# Patient Record
Sex: Female | Born: 1940
Health system: Southern US, Community
[De-identification: ages and names within clinical notes are randomized; demographics above are authoritative.]

## PROBLEM LIST (undated history)

## (undated) DIAGNOSIS — H9191 Unspecified hearing loss, right ear: Secondary | ICD-10-CM

## (undated) DIAGNOSIS — F329 Major depressive disorder, single episode, unspecified: Secondary | ICD-10-CM

## (undated) DIAGNOSIS — J449 Chronic obstructive pulmonary disease, unspecified: Secondary | ICD-10-CM

## (undated) DIAGNOSIS — M8580 Other specified disorders of bone density and structure, unspecified site: Secondary | ICD-10-CM

## (undated) DIAGNOSIS — I2510 Atherosclerotic heart disease of native coronary artery without angina pectoris: Secondary | ICD-10-CM

## (undated) DIAGNOSIS — R011 Cardiac murmur, unspecified: Secondary | ICD-10-CM

## (undated) DIAGNOSIS — C679 Malignant neoplasm of bladder, unspecified: Secondary | ICD-10-CM

## (undated) DIAGNOSIS — F32A Depression, unspecified: Secondary | ICD-10-CM

## (undated) DIAGNOSIS — R519 Headache, unspecified: Secondary | ICD-10-CM

## (undated) DIAGNOSIS — I10 Essential (primary) hypertension: Secondary | ICD-10-CM

## (undated) DIAGNOSIS — I639 Cerebral infarction, unspecified: Secondary | ICD-10-CM

## (undated) DIAGNOSIS — E785 Hyperlipidemia, unspecified: Secondary | ICD-10-CM

## (undated) DIAGNOSIS — K589 Irritable bowel syndrome without diarrhea: Secondary | ICD-10-CM

## (undated) DIAGNOSIS — R002 Palpitations: Secondary | ICD-10-CM

## (undated) DIAGNOSIS — I872 Venous insufficiency (chronic) (peripheral): Secondary | ICD-10-CM

## (undated) DIAGNOSIS — F17210 Nicotine dependence, cigarettes, uncomplicated: Secondary | ICD-10-CM

## (undated) DIAGNOSIS — E039 Hypothyroidism, unspecified: Secondary | ICD-10-CM

## (undated) DIAGNOSIS — H269 Unspecified cataract: Secondary | ICD-10-CM

## (undated) DIAGNOSIS — M1990 Unspecified osteoarthritis, unspecified site: Secondary | ICD-10-CM

## (undated) DIAGNOSIS — R51 Headache: Secondary | ICD-10-CM

## (undated) DIAGNOSIS — I341 Nonrheumatic mitral (valve) prolapse: Secondary | ICD-10-CM

## (undated) DIAGNOSIS — F419 Anxiety disorder, unspecified: Secondary | ICD-10-CM

## (undated) DIAGNOSIS — D333 Benign neoplasm of cranial nerves: Secondary | ICD-10-CM

## (undated) DIAGNOSIS — I679 Cerebrovascular disease, unspecified: Secondary | ICD-10-CM

## (undated) DIAGNOSIS — I729 Aneurysm of unspecified site: Secondary | ICD-10-CM

## (undated) DIAGNOSIS — Z87442 Personal history of urinary calculi: Secondary | ICD-10-CM

## (undated) HISTORY — DX: Benign neoplasm of cranial nerves: D33.3

## (undated) HISTORY — DX: Chronic obstructive pulmonary disease, unspecified: J44.9

## (undated) HISTORY — DX: Cardiac murmur, unspecified: R01.1

## (undated) HISTORY — DX: Hypothyroidism, unspecified: E03.9

## (undated) HISTORY — DX: Hyperlipidemia, unspecified: E78.5

## (undated) HISTORY — DX: Cerebral infarction, unspecified: I63.9

## (undated) HISTORY — PX: EYE SURGERY: SHX253

## (undated) HISTORY — DX: Anxiety disorder, unspecified: F41.9

## (undated) HISTORY — DX: Atherosclerotic heart disease of native coronary artery without angina pectoris: I25.10

## (undated) HISTORY — DX: Nonrheumatic mitral (valve) prolapse: I34.1

## (undated) HISTORY — DX: Venous insufficiency (chronic) (peripheral): I87.2

## (undated) HISTORY — DX: Major depressive disorder, single episode, unspecified: F32.9

## (undated) HISTORY — PX: APPENDECTOMY: SHX54

## (undated) HISTORY — DX: Aneurysm of unspecified site: I72.9

## (undated) HISTORY — DX: Depression, unspecified: F32.A

## (undated) HISTORY — DX: Unspecified cataract: H26.9

## (undated) HISTORY — DX: Palpitations: R00.2

## (undated) HISTORY — DX: Irritable bowel syndrome, unspecified: K58.9

## (undated) HISTORY — DX: Nicotine dependence, cigarettes, uncomplicated: F17.210

## (undated) HISTORY — DX: Other specified disorders of bone density and structure, unspecified site: M85.80

## (undated) HISTORY — DX: Cerebrovascular disease, unspecified: I67.9

## (undated) HISTORY — PX: DIAGNOSTIC LAPAROSCOPY: SUR761

## (undated) HISTORY — PX: TONSILLECTOMY: SUR1361

---

## 1968-06-12 HISTORY — PX: TOTAL ABDOMINAL HYSTERECTOMY: SHX209

## 1984-06-12 HISTORY — PX: BREAST LUMPECTOMY: SHX2

## 2000-06-12 HISTORY — PX: TRANSLABYRINTHINE PROCEDURE: SHX5220

## 2001-01-17 ENCOUNTER — Encounter: Admission: RE | Admit: 2001-01-17 | Discharge: 2001-01-17 | Payer: Self-pay | Admitting: Pulmonary Disease

## 2001-01-17 ENCOUNTER — Encounter: Payer: Self-pay | Admitting: Pulmonary Disease

## 2004-07-15 ENCOUNTER — Ambulatory Visit: Payer: Self-pay | Admitting: Pulmonary Disease

## 2004-08-29 ENCOUNTER — Ambulatory Visit: Payer: Self-pay | Admitting: Gastroenterology

## 2004-09-09 ENCOUNTER — Ambulatory Visit: Payer: Self-pay | Admitting: Gastroenterology

## 2005-08-07 ENCOUNTER — Ambulatory Visit: Payer: Self-pay | Admitting: Pulmonary Disease

## 2005-08-09 ENCOUNTER — Encounter: Admission: RE | Admit: 2005-08-09 | Discharge: 2005-08-09 | Payer: Self-pay | Admitting: Pulmonary Disease

## 2006-04-11 ENCOUNTER — Ambulatory Visit (HOSPITAL_COMMUNITY): Admission: RE | Admit: 2006-04-11 | Discharge: 2006-04-11 | Payer: Self-pay | Admitting: Pulmonary Disease

## 2006-04-11 ENCOUNTER — Ambulatory Visit: Payer: Self-pay | Admitting: Pulmonary Disease

## 2006-04-23 ENCOUNTER — Ambulatory Visit: Payer: Self-pay | Admitting: Pulmonary Disease

## 2006-11-02 ENCOUNTER — Ambulatory Visit: Payer: Self-pay | Admitting: Pulmonary Disease

## 2006-11-02 LAB — CONVERTED CEMR LAB
Alkaline Phosphatase: 127 units/L — ABNORMAL HIGH (ref 39–117)
BUN: 12 mg/dL (ref 6–23)
Bilirubin, Direct: 0.1 mg/dL (ref 0.0–0.3)
CO2: 29 meq/L (ref 19–32)
Cholesterol: 164 mg/dL (ref 0–200)
Creatinine, Ser: 0.7 mg/dL (ref 0.4–1.2)
Eosinophils Absolute: 0.3 10*3/uL (ref 0.0–0.6)
Eosinophils Relative: 2.5 % (ref 0.0–5.0)
GFR calc Af Amer: 108 mL/min
GFR calc non Af Amer: 89 mL/min
HDL: 44.8 mg/dL (ref >39.0)
Lymphocytes Relative: 19.9 % (ref 12.0–46.0)
Monocytes Absolute: 0.5 10*3/uL (ref 0.2–0.7)
Monocytes Relative: 4.3 % (ref 3.0–11.0)
Neutro Abs: 8.1 10*3/uL — ABNORMAL HIGH (ref 1.4–7.7)
Platelets: 294 10*3/uL (ref 150–400)
RBC: 4.22 M/uL (ref 3.87–5.11)
RDW: 11.8 % (ref 11.5–14.6)
TSH: 0.53 microintl units/mL (ref 0.35–5.50)
Triglycerides: 153 mg/dL — ABNORMAL HIGH (ref 0–149)
VLDL: 31 mg/dL (ref 0–40)

## 2007-07-18 ENCOUNTER — Ambulatory Visit: Payer: Self-pay | Admitting: Pulmonary Disease

## 2007-07-18 DIAGNOSIS — F411 Generalized anxiety disorder: Secondary | ICD-10-CM | POA: Insufficient documentation

## 2007-07-18 DIAGNOSIS — M797 Fibromyalgia: Secondary | ICD-10-CM | POA: Insufficient documentation

## 2007-07-18 DIAGNOSIS — E785 Hyperlipidemia, unspecified: Secondary | ICD-10-CM

## 2007-07-18 DIAGNOSIS — IMO0001 Reserved for inherently not codable concepts without codable children: Secondary | ICD-10-CM

## 2007-07-18 DIAGNOSIS — E782 Mixed hyperlipidemia: Secondary | ICD-10-CM | POA: Insufficient documentation

## 2007-07-18 DIAGNOSIS — K589 Irritable bowel syndrome without diarrhea: Secondary | ICD-10-CM | POA: Insufficient documentation

## 2007-07-18 DIAGNOSIS — J4489 Other specified chronic obstructive pulmonary disease: Secondary | ICD-10-CM | POA: Insufficient documentation

## 2007-07-18 DIAGNOSIS — F418 Other specified anxiety disorders: Secondary | ICD-10-CM | POA: Insufficient documentation

## 2007-07-18 DIAGNOSIS — I059 Rheumatic mitral valve disease, unspecified: Secondary | ICD-10-CM | POA: Insufficient documentation

## 2007-07-18 DIAGNOSIS — J449 Chronic obstructive pulmonary disease, unspecified: Secondary | ICD-10-CM | POA: Insufficient documentation

## 2007-07-18 DIAGNOSIS — N200 Calculus of kidney: Secondary | ICD-10-CM | POA: Insufficient documentation

## 2007-08-13 ENCOUNTER — Ambulatory Visit: Payer: Self-pay | Admitting: Pulmonary Disease

## 2007-08-18 DIAGNOSIS — E039 Hypothyroidism, unspecified: Secondary | ICD-10-CM | POA: Insufficient documentation

## 2007-08-18 DIAGNOSIS — F17209 Nicotine dependence, unspecified, with unspecified nicotine-induced disorders: Secondary | ICD-10-CM | POA: Insufficient documentation

## 2007-08-18 DIAGNOSIS — D333 Benign neoplasm of cranial nerves: Secondary | ICD-10-CM | POA: Insufficient documentation

## 2007-08-18 DIAGNOSIS — F17200 Nicotine dependence, unspecified, uncomplicated: Secondary | ICD-10-CM

## 2007-08-18 DIAGNOSIS — Z86018 Personal history of other benign neoplasm: Secondary | ICD-10-CM | POA: Insufficient documentation

## 2007-08-18 LAB — CONVERTED CEMR LAB
ALT: 15 units/L (ref 0–35)
AST: 19 units/L (ref 0–37)
Alkaline Phosphatase: 149 units/L — ABNORMAL HIGH (ref 39–117)
CO2: 24 meq/L (ref 19–32)
Eosinophils Absolute: 0.9 10*3/uL — ABNORMAL HIGH (ref 0.0–0.6)
Eosinophils Relative: 8.6 % — ABNORMAL HIGH (ref 0.0–5.0)
GFR calc Af Amer: 92 mL/min
GFR calc non Af Amer: 76 mL/min
Glucose, Bld: 91 mg/dL (ref 70–99)
Hemoglobin: 13.5 g/dL (ref 12.0–15.0)
Lymphocytes Relative: 29.5 % (ref 12.0–46.0)
Monocytes Absolute: 0.4 10*3/uL (ref 0.2–0.7)
Neutro Abs: 5.9 10*3/uL (ref 1.4–7.7)
Neutrophils Relative %: 57.5 % (ref 43.0–77.0)
Potassium: 3.9 meq/L (ref 3.5–5.1)
Rhuematoid fact SerPl-aCnc: <20 intl units/mL — ABNORMAL LOW (ref 0.0–20.0)
Total Bilirubin: 0.6 mg/dL (ref 0.3–1.2)
Total Protein: 7.3 g/dL (ref 6.0–8.3)
WBC: 10.2 10*3/uL (ref 4.5–10.5)

## 2007-09-16 ENCOUNTER — Ambulatory Visit: Payer: Self-pay | Admitting: Pulmonary Disease

## 2007-09-16 DIAGNOSIS — G4700 Insomnia, unspecified: Secondary | ICD-10-CM | POA: Insufficient documentation

## 2007-12-18 ENCOUNTER — Ambulatory Visit: Payer: Self-pay | Admitting: Pulmonary Disease

## 2007-12-18 DIAGNOSIS — M949 Disorder of cartilage, unspecified: Secondary | ICD-10-CM

## 2007-12-18 DIAGNOSIS — M899 Disorder of bone, unspecified: Secondary | ICD-10-CM | POA: Insufficient documentation

## 2007-12-21 LAB — CONVERTED CEMR LAB
ALT: 19 units/L (ref 0–35)
AST: 23 units/L (ref 0–37)
Albumin: 4.1 g/dL (ref 3.5–5.2)
BUN: 13 mg/dL (ref 6–23)
Cholesterol: 234 mg/dL (ref 0–200)
Direct LDL: 138.7 mg/dL
GFR calc non Af Amer: 106 mL/min
Glucose, Bld: 92 mg/dL (ref 70–99)
HDL: 39.5 mg/dL (ref >39.0)
Lymphocytes Relative: 33.7 % (ref 12.0–46.0)
MCHC: 34.5 g/dL (ref 30.0–36.0)
Neutro Abs: 6.5 10*3/uL (ref 1.4–7.7)
Neutrophils Relative %: 57.5 % (ref 43.0–77.0)
Potassium: 3.7 meq/L (ref 3.5–5.1)
Sodium: 145 meq/L (ref 135–145)
TSH: 0.07 microintl units/mL — ABNORMAL LOW (ref 0.35–5.50)
Total CHOL/HDL Ratio: 5.9
Triglycerides: 218 mg/dL (ref 0–149)
Vit D, 1,25-Dihydroxy: 16 — ABNORMAL LOW (ref 30–89)
WBC: 11.2 10*3/uL — ABNORMAL HIGH (ref 4.5–10.5)

## 2007-12-23 ENCOUNTER — Encounter: Payer: Self-pay | Admitting: Pulmonary Disease

## 2007-12-23 ENCOUNTER — Ambulatory Visit: Payer: Self-pay | Admitting: Internal Medicine

## 2007-12-25 ENCOUNTER — Telehealth (INDEPENDENT_AMBULATORY_CARE_PROVIDER_SITE_OTHER): Payer: Self-pay | Admitting: *Deleted

## 2008-03-27 ENCOUNTER — Encounter: Admission: RE | Admit: 2008-03-27 | Discharge: 2008-03-27 | Payer: Self-pay | Admitting: Obstetrics and Gynecology

## 2008-08-10 ENCOUNTER — Ambulatory Visit: Payer: Self-pay | Admitting: Pulmonary Disease

## 2008-08-10 DIAGNOSIS — I872 Venous insufficiency (chronic) (peripheral): Secondary | ICD-10-CM | POA: Insufficient documentation

## 2008-08-10 DIAGNOSIS — E559 Vitamin D deficiency, unspecified: Secondary | ICD-10-CM | POA: Insufficient documentation

## 2008-08-10 LAB — CONVERTED CEMR LAB: Vit D, 25-Hydroxy: 69 ng/mL (ref 30–89)

## 2008-08-17 LAB — CONVERTED CEMR LAB
AST: 21 units/L (ref 0–37)
Alkaline Phosphatase: 103 units/L (ref 39–117)
Basophils Absolute: 0 10*3/uL (ref 0.0–0.1)
Calcium: 9.5 mg/dL (ref 8.4–10.5)
GFR calc Af Amer: 107 mL/min
GFR calc non Af Amer: 89 mL/min
HCT: 40.2 % (ref 36.0–46.0)
HDL: 47.1 mg/dL (ref >39.0)
Hemoglobin: 13.8 g/dL (ref 12.0–15.0)
LDL Cholesterol: 107 mg/dL — ABNORMAL HIGH (ref 0–99)
MCHC: 34.5 g/dL (ref 30.0–36.0)
MCV: 91.8 fL (ref 78.0–100.0)
Monocytes Absolute: 0.4 10*3/uL (ref 0.1–1.0)
Neutro Abs: 7.2 10*3/uL (ref 1.4–7.7)
Potassium: 3.8 meq/L (ref 3.5–5.1)
RBC: 4.37 M/uL (ref 3.87–5.11)
RDW: 12.1 % (ref 11.5–14.6)
Sodium: 142 meq/L (ref 135–145)
TSH: 17.71 microintl units/mL — ABNORMAL HIGH (ref 0.35–5.50)
Total Bilirubin: 0.7 mg/dL (ref 0.3–1.2)

## 2008-09-30 ENCOUNTER — Telehealth (INDEPENDENT_AMBULATORY_CARE_PROVIDER_SITE_OTHER): Payer: Self-pay | Admitting: *Deleted

## 2009-01-13 ENCOUNTER — Encounter: Payer: Self-pay | Admitting: Pulmonary Disease

## 2009-03-11 ENCOUNTER — Telehealth (INDEPENDENT_AMBULATORY_CARE_PROVIDER_SITE_OTHER): Payer: Self-pay | Admitting: *Deleted

## 2009-03-11 ENCOUNTER — Ambulatory Visit: Payer: Self-pay | Admitting: Pulmonary Disease

## 2009-03-19 ENCOUNTER — Telehealth: Payer: Self-pay | Admitting: Pulmonary Disease

## 2009-03-24 ENCOUNTER — Ambulatory Visit (HOSPITAL_COMMUNITY): Admission: RE | Admit: 2009-03-24 | Discharge: 2009-03-24 | Payer: Self-pay | Admitting: Pulmonary Disease

## 2009-03-26 DIAGNOSIS — I729 Aneurysm of unspecified site: Secondary | ICD-10-CM

## 2009-03-26 DIAGNOSIS — Z8679 Personal history of other diseases of the circulatory system: Secondary | ICD-10-CM | POA: Insufficient documentation

## 2009-03-29 ENCOUNTER — Telehealth (INDEPENDENT_AMBULATORY_CARE_PROVIDER_SITE_OTHER): Payer: Self-pay | Admitting: *Deleted

## 2009-03-31 ENCOUNTER — Encounter: Payer: Self-pay | Admitting: Pulmonary Disease

## 2009-03-31 ENCOUNTER — Encounter: Payer: Self-pay | Admitting: Cardiology

## 2009-04-02 ENCOUNTER — Ambulatory Visit (HOSPITAL_COMMUNITY): Admission: RE | Admit: 2009-04-02 | Discharge: 2009-04-02 | Payer: Self-pay | Admitting: Interventional Radiology

## 2009-04-12 HISTORY — PX: ANEURYSM COILING: SHX5349

## 2009-04-23 ENCOUNTER — Telehealth: Payer: Self-pay | Admitting: Pulmonary Disease

## 2009-04-26 ENCOUNTER — Inpatient Hospital Stay (HOSPITAL_COMMUNITY): Admission: RE | Admit: 2009-04-26 | Discharge: 2009-04-27 | Payer: Self-pay | Admitting: Interventional Radiology

## 2009-05-12 ENCOUNTER — Encounter: Payer: Self-pay | Admitting: Interventional Radiology

## 2009-05-13 ENCOUNTER — Ambulatory Visit: Payer: Self-pay | Admitting: Pulmonary Disease

## 2009-05-13 ENCOUNTER — Telehealth (INDEPENDENT_AMBULATORY_CARE_PROVIDER_SITE_OTHER): Payer: Self-pay | Admitting: *Deleted

## 2009-05-28 ENCOUNTER — Ambulatory Visit: Payer: Self-pay | Admitting: Pulmonary Disease

## 2009-07-13 ENCOUNTER — Ambulatory Visit: Payer: Self-pay | Admitting: Pulmonary Disease

## 2009-07-13 DIAGNOSIS — I679 Cerebrovascular disease, unspecified: Secondary | ICD-10-CM | POA: Insufficient documentation

## 2009-07-22 ENCOUNTER — Telehealth (INDEPENDENT_AMBULATORY_CARE_PROVIDER_SITE_OTHER): Payer: Self-pay | Admitting: *Deleted

## 2009-07-30 ENCOUNTER — Ambulatory Visit (HOSPITAL_COMMUNITY): Admission: RE | Admit: 2009-07-30 | Discharge: 2009-07-30 | Payer: Self-pay | Admitting: Interventional Radiology

## 2009-07-31 ENCOUNTER — Ambulatory Visit (HOSPITAL_COMMUNITY): Admission: RE | Admit: 2009-07-31 | Discharge: 2009-07-31 | Payer: Self-pay | Admitting: Interventional Radiology

## 2009-11-19 ENCOUNTER — Ambulatory Visit: Payer: Self-pay | Admitting: Pulmonary Disease

## 2009-11-22 LAB — CONVERTED CEMR LAB
AST: 22 units/L (ref 0–37)
Albumin: 3.9 g/dL (ref 3.5–5.2)
Alkaline Phosphatase: 120 units/L — ABNORMAL HIGH (ref 39–117)
Basophils Absolute: 0 10*3/uL (ref 0.0–0.1)
Basophils Relative: 0.5 % (ref 0.0–3.0)
CO2: 30 meq/L (ref 19–32)
Calcium: 9.5 mg/dL (ref 8.4–10.5)
Cholesterol: 162 mg/dL (ref 0–200)
Direct LDL: 88 mg/dL
Eosinophils Absolute: 0.2 10*3/uL (ref 0.0–0.7)
Eosinophils Relative: 2.2 % (ref 0.0–5.0)
GFR calc non Af Amer: 97.83 mL/min (ref >60)
HCT: 38.5 % (ref 36.0–46.0)
Lymphocytes Relative: 33.5 % (ref 12.0–46.0)
Lymphs Abs: 3 10*3/uL (ref 0.7–4.0)
MCHC: 33.5 g/dL (ref 30.0–36.0)
MCV: 86.7 fL (ref 78.0–100.0)
Monocytes Relative: 5.5 % (ref 3.0–12.0)
RBC: 4.44 M/uL (ref 3.87–5.11)
Sodium: 143 meq/L (ref 135–145)
Total Bilirubin: 0.5 mg/dL (ref 0.3–1.2)
Total Protein: 6.9 g/dL (ref 6.0–8.3)
Triglycerides: 230 mg/dL — ABNORMAL HIGH (ref 0.0–149.0)

## 2009-12-22 ENCOUNTER — Encounter: Payer: Self-pay | Admitting: Pulmonary Disease

## 2009-12-22 ENCOUNTER — Encounter: Payer: Self-pay | Admitting: Cardiology

## 2010-01-27 ENCOUNTER — Ambulatory Visit: Payer: Self-pay | Admitting: Internal Medicine

## 2010-02-09 ENCOUNTER — Encounter: Payer: Self-pay | Admitting: Pulmonary Disease

## 2010-03-01 ENCOUNTER — Encounter: Payer: Self-pay | Admitting: Pulmonary Disease

## 2010-03-21 ENCOUNTER — Telehealth (INDEPENDENT_AMBULATORY_CARE_PROVIDER_SITE_OTHER): Payer: Self-pay | Admitting: *Deleted

## 2010-03-28 ENCOUNTER — Ambulatory Visit: Payer: Self-pay | Admitting: Pulmonary Disease

## 2010-03-28 DIAGNOSIS — R52 Pain, unspecified: Secondary | ICD-10-CM | POA: Insufficient documentation

## 2010-03-30 DIAGNOSIS — I6350 Cerebral infarction due to unspecified occlusion or stenosis of unspecified cerebral artery: Secondary | ICD-10-CM | POA: Insufficient documentation

## 2010-03-30 LAB — CONVERTED CEMR LAB
ANA Titer 1: 1:40 {titer} — ABNORMAL HIGH
Anti Nuclear Antibody(ANA): POSITIVE — AB
BUN: 15 mg/dL (ref 6–23)
Creatinine, Ser: 0.7 mg/dL (ref 0.4–1.2)
Eosinophils Relative: 1.5 % (ref 0.0–5.0)
Free T4: 1.19 ng/dL (ref 0.60–1.60)
GFR calc non Af Amer: 94.32 mL/min (ref >60)
Glucose, Bld: 80 mg/dL (ref 70–99)
Lymphocytes Relative: 30.2 % (ref 12.0–46.0)
Lymphs Abs: 3.2 10*3/uL (ref 0.7–4.0)
MCHC: 33.8 g/dL (ref 30.0–36.0)
MCV: 92.9 fL (ref 78.0–100.0)
Monocytes Relative: 4.6 % (ref 3.0–12.0)
Neutro Abs: 6.7 10*3/uL (ref 1.4–7.7)
Neutrophils Relative %: 63.6 % (ref 43.0–77.0)
Potassium: 4.3 meq/L (ref 3.5–5.1)
Rhuematoid fact SerPl-aCnc: <20 intl units/mL (ref 0–20)
Sed Rate: 21 mm/hr (ref 0–22)
Sodium: 144 meq/L (ref 135–145)
T3, Free: 2.9 pg/mL (ref 2.3–4.2)
TSH: 1.25 microintl units/mL (ref 0.35–5.50)

## 2010-04-04 ENCOUNTER — Ambulatory Visit: Payer: Self-pay | Admitting: Pulmonary Disease

## 2010-05-10 ENCOUNTER — Encounter: Payer: Self-pay | Admitting: Pulmonary Disease

## 2010-05-23 ENCOUNTER — Ambulatory Visit: Payer: Self-pay | Admitting: Pulmonary Disease

## 2010-06-16 ENCOUNTER — Encounter: Payer: Self-pay | Admitting: Pulmonary Disease

## 2010-06-30 ENCOUNTER — Telehealth: Payer: Self-pay | Admitting: Pulmonary Disease

## 2010-07-03 ENCOUNTER — Encounter: Payer: Self-pay | Admitting: Obstetrics and Gynecology

## 2010-07-03 ENCOUNTER — Encounter: Payer: Self-pay | Admitting: Interventional Radiology

## 2010-07-10 LAB — CONVERTED CEMR LAB
AST: 21 units/L (ref 0–37)
Albumin: 4.3 g/dL (ref 3.5–5.2)
Basophils Absolute: 0 10*3/uL (ref 0.0–0.1)
Bilirubin, Direct: 0.1 mg/dL (ref 0.0–0.3)
CO2: 28 meq/L (ref 19–32)
Calcium: 9.5 mg/dL (ref 8.4–10.5)
Chloride: 107 meq/L (ref 96–112)
Creatinine, Ser: 0.6 mg/dL (ref 0.4–1.2)
Eosinophils Absolute: 0.3 10*3/uL (ref 0.0–0.7)
Glucose, Bld: 87 mg/dL (ref 70–99)
HCT: 37.7 % (ref 36.0–46.0)
MCHC: 34.9 g/dL (ref 30.0–36.0)
Monocytes Absolute: 0.7 10*3/uL (ref 0.1–1.0)
Neutro Abs: 6.2 10*3/uL (ref 1.4–7.7)
Platelets: 253 10*3/uL (ref 150.0–400.0)
Pro B Natriuretic peptide (BNP): 42 pg/mL (ref 0.0–100.0)
RDW: 12.2 % (ref 11.5–14.6)
TSH: 0.21 microintl units/mL — ABNORMAL LOW (ref 0.35–5.50)
Total Protein: 7.4 g/dL (ref 6.0–8.3)

## 2010-07-12 NOTE — Assessment & Plan Note (Signed)
Summary: ROV/ MBW   Primary Care Provider:  Dr. Kriste Basque   CC:  4 month ROV & review of mult medical problems....  History of Present Illness: 70 y/o WF here for a follow up visit... she has multiple medical problems as reviewed below...     ~  March 11, 2009:  she had a good 59mo until 9/16 when she had a "spell" w/ palpitations/ skipping & "lost control of my body"... they called 911 & paramedics came but she refused transport to ER... states the episode lasted 2-3 hrs & had n/v x1... there were no focal symptoms, and recurrence since that day...  she has hx Acoustic Neuroma w/ surg at [facility] in 2002- last MRI 2007 stable, no recurrence... she has kown MVP w/ prev cardiac eval in 1995 by DrStuckey- neg cath, mild MVP (see below)... mult risk factors and still smokes- last Cardiolite 1999 was neg- no infarct or ischemia, EF=66%.   ** eval w/ CXR (no change), EKG (no change), labs (NAD), & MRI Br= no infarct, no mass, clear sinuses, post-op changes right temporal & mastoid region w/o recurrent tumor; but 4x25mm basilar tip aneurysm noted>> refer to IR.   ~  July 13, 2009:  she's had an eventful 26mo w/ eval by DrDeveshwar & hosp 11/10 for stent-assisted coiling of basilar tip aneurysm- disch on ASA & Plavix but the Plavix has since been stopped... she developed incr headaches- frontal & band like, w/ occas stabbing pain in right ear... seen by TP w/ right OM- Rx w/ ZPak, Cortisporin, Nasacort AQ, & Saline mist... pt notes HAs better after sleep... denies extra stress etc... ** we discussed Rx w/ Tramadol, Chlorazepate, etc...   ~  November 19, 2009:  she developed some gait instability 2/11 & was rechecked by IR_ DrDeveshwar> MRI showed right cbll lacunar infarct, & post op changes in right cerebellopontine angle, plus interval endovasc treatment of basilar tip aneurysm;  and f/u arteriogram showed angiographic obliteration of the prev basialr tip aneurysm- distal basilar art stent patent, 50% stenosis  right PCA... rec to restart PLAVIX + keep ASA 325mg /d, and quit smoking!!!  Her CC is aching & sore, low energy, etc- c/w her FM;  still smoking 1/4 ppd and can't/ won't quit despite everything.   Current Problems:   COPD (ICD-496) - she has a min smoker's cough, sm amt beige sputum...  ~  CXR 5/08 w/ chr changes, LLL scar, NAD.Marland Kitchen.  ~  CXR 7/09 showed norm hrt size, clear lungs, NAD.Marland Kitchen.  ~  CXR 9/10 showed NAD...  CIGARETTE SMOKER (ICD-305.1) - can't vs won't quit and not interested in smoking cessation help... now she states she's decr to  ~1/4 ppd but can't seem to improve from there...  MITRAL VALVE PROLAPSE (ICD-424.0) & Hx of CHEST PAIN (ICD-786.50) - she had CP in the past and a neg cath 1995... she takes ASA 325mg  daily...  ~  CP eval 1995 w/ cath showing min 10% luminal irreg RCA & mild MVP, norm LVF...  ~  Cardiolite 1999 was neg- no ischemia, no infarct, EF=66%...  ~  baseline EKG showed NSR, NSSTTWA (poor R prog V1-V3)...  CEREBROVASCULAR DISEASE (ICD-437.9), & ANEURYSM (ICD-442.9) - found to have a 4-18mm basilar tip aneurysm on MRI Br 10/10... referred to [facility] w/ Angiogram confirmation & subseq stent assisted coiling of the aneurysm 11/10... subseq f/u angiogram 2/11 showed complete angiographic obliteration of the aneurysm, & patent stent in distal basilar art (there was a new 50%stenosis  in the right PCA)... on ASA 325mg /d & PLAVIX restarted 2/11 w/ new gait abn & cbll infarct on MRI.  VENOUS INSUFFICIENCY (ICD-459.81) - she tells me that she has been seeing DrKrush for vein surgery and treatment ("it's covered by my husb's insurance")  HYPERLIPIDEMIA (ICD-272.4) - now on PRAVASTATIN 40mg /d & tol well...  ~  chart review shows TChol  ~ 200 range in the 80's and early 90's.  ~  then TChol incr to  ~250 range in late 90's and Lipitor started-   ~  FLP 5/08 on Lipitor 10mg /d showed TChol 164, TG 153, HDL 45, LDL 89.  ~  in 2009 c/o no energy & arm discomfort she felt was  from the Lip10- therefore DC'd.  ~  FLP 7/09 on diet alone showed TChol 234, TG 218, HDL 40, LDL 139... rec- start Prav40.  ~  FLP 3/10 on Prav40 showed TChol 182, Tg 137, HDL 47, LDL 107  ~  FLP 6/11 showed TChol 162, TG 230, HDL 37, LDL 88  HYPOTHYROIDISM (ICD-244.9) - stable on LEVOTHYROID 159mcg/d... hx Hashimoto's thyroiditis in 1989 w/ markedly pos antimicrosomal antibodies and transient hyperthy labs...   ~  labs 5/08 showed TSH= 0.53... rec- continue Levo100/d.  ~  labs 7/09 showed TSH= 0.07... rec- decr Levothy100 to 1/2 daily.  ~  labs 3/10 showed TSH= 17.71... rec> incr back to 180mcg/d.  ~  labs 9/10 showed TSH= 0.21... continue same.  ~  labs 6/11 showed TSH= 0.10... rec decr Levo100 to 1/2 daily.  IRRITABLE BOWEL SYNDROME (ICD-564.1) - had colonoscopy by DrPatterson 11/06 that was WNL.Marland Kitchen. she notes some constipation & Rx w/ colase OTC...  NEPHROLITHIASIS (ICD-592.0) - last stone passed in 1993, no known recurrence since then...  Hx of TENDINITIS, RIGHT ELBOW (ICD-727.09)  FIBROMYALGIA (ICD-729.1) - all prev symptoms resolved...  ~  labs 9/10 showed Sed= 24  OSTEOPENIA (ICD-733.90) - BMD here 7/09 showed TScores -0.8 in Spine, and -1.2 in right FemNeck... rec to take Calcium, MVI, Vit D...  VITAMIN D DEFICIENCY (ICD-268.9)  ~  labs 7/09 showed Vit D level = 16... rec- start Vit D 50K weekly.  ~  labs 3/10 showed Vit D level = 69... rec> change to 1000 u daily.  ~  labs 6/11 showed Vit d level = 55... continue same.  Hx of ACOUSTIC NEUROMA (ICD-225.1) - s/p surg for this tumor 10/02 at [facility]... no known recurrence...  ~  MRI Brain 2/07 showed s/p translabyrinthine approach to right acoustic neuroma w/o recurrence, NAD...  ~  MRI Brain 10/10 showed no recurrence of tumor, but 4-61mm basilar tip aneurysm detected & Rx as above.  ~  MRI Brain 2/11 showed no recurrence/ post op changes, endovasc oblit of basilar aneurysm, cerebellar lacunar infarct & 50% stenosis of right  PCA... PLAVIX restarted...  ANXIETY (ICD-300.00) - uses CHLORAZEPATE 7.5mg Tid... she gets claustrophobic and needs sedation before MRIs.   Current Medications (verified): 1)  Bayer Aspirin 325 Mg Tabs (Aspirin) .... Take 1 Tablet By Mouth Once A Day 2)  Pravastatin Sodium 40 Mg  Tabs (Pravastatin Sodium) .... Take 1 Tablet By Mouth Once A Day 3)  Levoxyl 100 Mcg  Tabs (Levothyroxine Sodium) .... Take 1  Tablet By Mouth Once A Day 4)  Caltrate 600+d 600-400 Mg-Unit Tabs (Calcium Carbonate-Vitamin D) .... Take 1 Tab By Mouth Two Times A Day... 5)  Vitamin D 1000 Unit Tabs (Cholecalciferol) .... Take 1 Cap By Mouth Once Daily.Marland KitchenMarland Kitchen 6)  Clorazepate Dipotassium 7.5  Mg  Tabs (Clorazepate Dipotassium) .Marland Kitchen.. 1 Tab By Mouth Three Times A Day As Directed... 7)  Tramadol Hcl 50 Mg Tabs (Tramadol Hcl) .... Take 1 Tab By Mouth Three Times A Day As Directed... 8)  Plavix 75 Mg Tabs (Clopidogrel Bisulfate) .... Take 1 Tablet By Mouth Once A Day 9)  Stool Softener Laxative 8.6-50 Mg Tabs (Sennosides-Docusate Sodium) .... 2 Tabs By Mouth At Bedtime  Allergies (verified): 1)  ! Lipitor (Atorvastatin Calcium) 2)  ! Vicodin 3)  ! Morphine  Comments:  Nurse/Medical Assistant: The patient's medications and allergies were reviewed with the patient and were updated in the Medication and Allergy Lists. Boone Master CNA/MA  November 19, 2009 10:20 AM   Past History:  Past Medical History: COPD (ICD-496) CIGARETTE SMOKER (ICD-305.1) MITRAL VALVE PROLAPSE (ICD-424.0) Hx of CHEST PAIN (ICD-786.50) PALPITATIONS, HX OF (ICD-V12.50) CEREBROVASCULAR DISEASE (ICD-437.9) - known 50% stenosis in right PCA on angiogram 2/11 ANEURYSM (ICD-442.9) - s/p stent assisted endovascular obliteration of basilar tip aneurysm 11/10 by DrDeveshwar VENOUS INSUFFICIENCY (ICD-459.81) HYPERLIPIDEMIA (ICD-272.4) HYPOTHYROIDISM (ICD-244.9) IRRITABLE BOWEL SYNDROME (ICD-564.1) NEPHROLITHIASIS (ICD-592.0) Hx of TENDINITIS, RIGHT ELBOW  (ICD-727.09) FIBROMYALGIA (ICD-729.1) OSTEOPENIA (ICD-733.90) VITAMIN D DEFICIENCY (ICD-268.9) Hx of ACOUSTIC NEUROMA (ICD-225.1) - s/p translabyrinthine surg 2002 at [facility] DrBrowne   s/p lacunar infarct in right cerebellar hemisphere noted on MRI 2/11 HEADACHE (ICD-784.0) ANXIETY (ICD-300.00) INSOMNIA (ICD-780.52)  Past Surgical History: S/P neurosurgery for acoustic neuroma -2002 at [facility] S/P hysterectomy  S/P breast lumpectomy S/P endovascular coiling of basilar tip aneurysm 11/10 by DrDeveshwar  Family History: Reviewed history from 04/20/2009 and no changes required.  Her mother died at age 70.  She died from an MI, but   she had also had a CVA.  Her father died at age 30 from cancer.  The  patient is not aware of any family history of cerebral aneurysms.      Social History: Reviewed history from 04/20/2009 and no changes required. Married Children + smoker- 1/4 ppd at present social alcohol employ- alterations work  Review of Systems      See HPI       The patient complains of dyspnea on exertion and difficulty walking.  The patient denies anorexia, fever, weight loss, weight gain, vision loss, decreased hearing, hoarseness, chest pain, syncope, peripheral edema, prolonged cough, headaches, hemoptysis, abdominal pain, melena, hematochezia, severe indigestion/heartburn, hematuria, incontinence, muscle weakness, suspicious skin lesions, transient blindness, depression, unusual weight change, abnormal bleeding, enlarged lymph nodes, and angioedema.    Vital Signs:  Patient profile:   70 year old female Height:      62.5 inches Weight:      128.31 pounds BMI:     23.18 O2 Sat:      97 % on Room air Temp:     98.9 degrees F oral Pulse rate:   73 / minute BP sitting:   138 / 70  (left arm) Cuff size:   regular  Vitals Entered By: Boone Master CNA/MA (November 19, 2009 10:20 AM)  O2 Flow:  Room air  Physical Exam  Additional Exam:  WD, WN, 70 y/o WF in NAD... GENERAL:   Alert & oriented; pleasant & cooperative. HEENT:  [place]/AT, EOM-wnl, PERRLA, EACs-clear, TMs- some scarring on right, NOSE-clear, THROAT-clear & wnl. NECK:  Supple w/ fairROM; no JVD; normal carotid impulses w/o bruits; palp thyroid, no nodules felt; no lymphadenopathy. CHEST:  Clear to P & A; without wheezes/ rales/ or rhonchi. HEART:  Regular Rhythm; gr1/6 SEM,  no rubs or  gallops heard... ABDOMEN:  Soft & nontender; normal bowel sounds; no organomegaly or masses detected. EXT: without deformities, mild arthritic changes; no varicose veins/ venous insuffic/ or edema... NEURO:  CN's intact; no focal neuro deficits... gait & station are normal. DERM:  No lesions noted; no rash etc...    MISC. Report  Procedure date:  11/19/2009  Findings:      BMP (METABOL)   Sodium                    143 mEq/L                   135-145   Potassium                 4.3 mEq/L                   3.5-5.1   Chloride                  109 mEq/L                   96-112   Carbon Dioxide            30 mEq/L                    19-32   Glucose                   84 mg/dL                    40-98   BUN                       14 mg/dL                    1-19   Creatinine                0.6 mg/dL                   1.4-7.8   Calcium                   9.5 mg/dL                   2.9-56.2   GFR                       97.83 mL/min                >60  Hepatic/Liver Function Panel (HEPATIC)   Total Bilirubin           0.5 mg/dL                   1.3-0.8   Direct Bilirubin          0.1 mg/dL                   6.5-7.8   Alkaline Phosphatase [H]  120 U/L                     39-117   AST                       22 U/L                      0-37   ALT  16 U/L                      0-35   Total Protein             6.9 g/dL                    1.6-1.0   Albumin                   3.9 g/dL                    9.6-0.4  CBC Platelet w/Diff (CBCD)   White Cell Count          9.0 K/uL                    4.5-10.5   Red  Cell Count            4.44 Mil/uL                 3.87-5.11   Hemoglobin                12.9 g/dL                   54.0-98.1   Hematocrit                38.5 %                      36.0-46.0   MCV                       86.7 fl                     78.0-100.0   Platelet Count            287.0 K/uL                  150.0-400.0   Neutrophil %              58.3 %                      43.0-77.0   Lymphocyte %              33.5 %                      12.0-46.0   Monocyte %                5.5 %                       3.0-12.0   Eosinophils%              2.2 %                       0.0-5.0   Basophils %               0.5 %                       0.0-3.0  Comments:      Lipid Panel (LIPID)   Cholesterol               162 mg/dL  0-200   Triglycerides        [H]  230.0 mg/dL                 1.6-109.6   HDL                  [L]  04.54 mg/dL                 >09.81    Cholesterol LDL - Direct                             88.0 mg/dL  TSH (TSH)   FastTSH              [L]  0.10 uIU/mL                 0.35-5.50  Sed Rate (ESR)   Sed Rate             [H]  30 mm/hr                    0-22  Vitamin D (25-Hydroxy) (19147)  Vitamin D (25-Hydroxy)                             55 ng/mL                    30-89   Impression & Recommendations:  Problem # 1:  COPD (ICD-496) Despite all that she has been thru she is still smoking!!!  Must quit all smoking & offered Chantix, etc (she isn't interested)...  Problem # 2:  MITRAL VALVE PROLAPSE (ICD-424.0) Cardiac stable>  must quit smoking etc... Her updated medication list for this problem includes:    Bayer Aspirin 325 Mg Tabs (Aspirin) .Marland Kitchen... Take 1 tablet by mouth once a day    Plavix 75 Mg Tabs (Clopidogrel bisulfate) .Marland Kitchen... Take 1 tablet by mouth once a day  Problem # 3:  CEREBROVASCULAR DISEASE (ICD-437.9) Followed by DrDeveshwar... as noted in the HPI...  Problem # 4:  HYPERLIPIDEMIA (ICD-272.4) Stable on Prav40 but needs better low  fat diet... Her updated medication list for this problem includes:    Pravastatin Sodium 40 Mg Tabs (Pravastatin sodium) .Marland Kitchen... Take 1 tablet by mouth once a day  Orders: TLB-BMP (Basic Metabolic Panel-BMET) (80048-METABOL) TLB-Hepatic/Liver Function Pnl (80076-HEPATIC) TLB-CBC Platelet - w/Differential (85025-CBCD) TLB-Lipid Panel (80061-LIPID) TLB-TSH (Thyroid Stimulating Hormone) (84443-TSH) TLB-Sedimentation Rate (ESR) (85652-ESR) T-Vitamin D (25-Hydroxy) (82956-21308)  Problem # 5:  HYPOTHYROIDISM (ICD-244.9) TSH is low> either oversuppressed on the or she's become hyperthy/ overactive again... needs to decr Levo100 to 1/2 daily & we will f/u TSH on this... Her updated medication list for this problem includes:    Levoxyl 100 Mcg Tabs (Levothyroxine sodium) .Marland Kitchen... Take 1  tablet by mouth once a day  Problem # 6:  FIBROMYALGIA (ICD-729.1) we discussed exercise, rest, meds... Her updated medication list for this problem includes:    Bayer Aspirin 325 Mg Tabs (Aspirin) .Marland Kitchen... Take 1 tablet by mouth once a day    Tramadol Hcl 50 Mg Tabs (Tramadol hcl) .Marland Kitchen... Take 1 tab by mouth three times a day as directed...  Problem # 7:  OTHER MEDICAL PROBLEMS AS NOTED>>>  Complete Medication List: 1)  Bayer Aspirin 325 Mg Tabs (Aspirin) .... Take 1 tablet by mouth once a day 2)  Plavix 75 Mg Tabs (Clopidogrel  bisulfate) .... Take 1 tablet by mouth once a day 3)  Pravastatin Sodium 40 Mg Tabs (Pravastatin sodium) .... Take 1 tablet by mouth once a day 4)  Levoxyl 100 Mcg Tabs (Levothyroxine sodium) .... Take 1  tablet by mouth once a day 5)  Caltrate 600+d 600-400 Mg-unit Tabs (Calcium carbonate-vitamin d) .... Take 1 tab by mouth two times a day... 6)  Vitamin D 1000 Unit Tabs (Cholecalciferol) .... Take 1 cap by mouth once daily.Marland KitchenMarland Kitchen 7)  Clorazepate Dipotassium 7.5 Mg Tabs (Clorazepate dipotassium) .Marland Kitchen.. 1 tab by mouth three times a day as directed... 8)  Tramadol Hcl 50 Mg Tabs (Tramadol  hcl) .... Take 1 tab by mouth three times a day as directed... 9)  Stool Softener Laxative 8.6-50 Mg Tabs (Sennosides-docusate sodium) .... 2 tabs by mouth at bedtime  Patient Instructions: 1)  Today we updated your med list- see below.... 2)  Continue your current meds the same... 3)  Add one extra strength Tylenol to the Tramadol you currently take three times a day... 4)  Today we did your follow up FASTING blood work... please call the "phone tree" in a few days for your lab results.Marland KitchenMarland Kitchen 5)  You should committ to a stretching exercise program like yoga etc... or [facility]... 6)  Call for any questions.Marland KitchenMarland Kitchen 7)  Please schedule a follow-up appointment in 6 months.

## 2010-07-12 NOTE — Assessment & Plan Note (Signed)
Summary: Acute NP office visit - arm and wrist pain   Primary Provider/Referring Provider:  Dr. Kriste Basque   CC:  right wrist pain w/ movement x1.70months.  History of Present Illness: 70 year old patient of Dr. Kriste Basque with know history of hyperlipidemia, COPD      ~  March 11, 2009:  she had a good 82mo until 9/16 when she had a "spell" w/ palpitations/ skipping & "lost control of my body"... they called 911 & paramedics came but she refused transport to ER... states the episode lasted 2-3 hrs & had n/v x1... there were no focal symptoms, and recurrence since that day...  she has hx Acoustic Neuroma w/ surg at [facility] in 2002- last MRI 2007 stable, no recurrence... she has kown MVP w/ prev cardiac eval in 1995 by DrStuckey- neg cath, mild MVP (see below)... mult risk factors and still smokes- last Cardiolite 199 was neg- no infarct or ischemia, EF=66%.      ** we discussed f/u CXR, lab work, MRI Brain, & Cards eval...  May 13, 2009--Presents for an acute office visit. Complains of constant severe right ear pain - states feels like "a pencil is stabbing" her x2weeks. No known injury. Has not been using qtips or other objects in ear. No change in hearing.Denies chest pain, dyspnea, orthopnea, hemoptysis, fever, n/v/d, edema, headache. Mild sinus drainage, congestion. 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 Corliss Skains. Has done well post op.  Had follow up yesterday. Recommended to see primary for ear pain.   May 28, 2009--Presents for follow up . Last visit seen w/ otitis media, tx w/ zpack and cortisporin drops. Ear pain is resolved. Feels better. Nose is stuffy, feels congested in sinus.       ~  July 13, 2009:  she's had an eventful 58mo w/ eval by DrDeveshwar & hosp 11/10 for stent-assisted coiling of basilar tip aneurysm- disch on ASA & Plavix but the Plavix has since been stopped... she developed incr headaches- frontal &  band like, w/ occas stabbing pain in right ear... seen by TP w/ right OM- Rx w/ ZPak, Cortisporin, Nasacort AQ, & Saline mist... pt notes HAs better after sleep... denies extra stress etc... ** we discussed Rx w/ Tramadol, Chlorazepate, etc...    ~  November 19, 2009:  she developed some gait instability 2/11 & was rechecked by IR_ DrDeveshwar> MRI showed right cbll lacunar infarct, & post op changes in right cerebellopontine angle, plus interval endovasc treatment of basilar tip aneurysm;  and f/u arteriogram showed angiographic obliteration of the prev basialr tip aneurysm- distal basilar art stent patent, 50% stenosis right PCA... rec to restart PLAVIX + keep ASA 325mg /d, and quit smoking!!!  Her CC is aching & sore, low energy, etc- c/w her FM;  still smoking 1/4 ppd and can't/ won't quit despite everything.  January 28, 2010 --Presents for an acute office visit. Complains of  right wrist pain w/ movement x1.63months. Complains of right wrist pain that has been getting progressively worse. Very sore at times, painful to use. Having trouble getting dressed. Wrist is slightly swollen at times w/ no redness. Sore to touch and with ROM. Decreased strength because it hurts to use it. No known injury. Left wrist, no trouble. Does have joint pains in shoulders at times, stiff in am. No rash, known injury or heavy lifting. 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 Denies chest pain,  dyspnea, orthopnea, hemoptysis, fever, n/v/d, edema, headache.   Medications Prior to Update: 1)  Bayer Aspirin 325 Mg Tabs (Aspirin) .... Take 1 Tablet By Mouth Once A Day 2)  Plavix 75 Mg Tabs (Clopidogrel Bisulfate) .... Take 1 Tablet By Mouth Once A Day 3)  Pravastatin Sodium 40 Mg  Tabs (Pravastatin Sodium) .... Take 1 Tablet By Mouth Once A Day 4)  Levoxyl 100 Mcg  Tabs (Levothyroxine Sodium) .... Take 1/2   Tablet By Mouth Once A Day 5)  Caltrate 600+d 600-400 Mg-Unit Tabs (Calcium Carbonate-Vitamin D) .... Take 1 Tab  By Mouth Two Times A Day... 6)  Vitamin D 1000 Unit Tabs (Cholecalciferol) .... Take 1 Cap By Mouth Once Daily.Marland KitchenMarland Kitchen 7)  Clorazepate Dipotassium 7.5 Mg  Tabs (Clorazepate Dipotassium) .Marland Kitchen.. 1 Tab By Mouth Three Times A Day As Directed... 8)  Tramadol Hcl 50 Mg Tabs (Tramadol Hcl) .... Take 1 Tab By Mouth Three Times A Day As Directed... 9)  Stool Softener Laxative 8.6-50 Mg Tabs (Sennosides-Docusate Sodium) .... 2 Tabs By Mouth At Bedtime  Current Medications (verified): 1)  Bayer Aspirin 325 Mg Tabs (Aspirin) .... Take 1 Tablet By Mouth Once A Day 2)  Plavix 75 Mg Tabs (Clopidogrel Bisulfate) .... Take 1 Tablet By Mouth Once A Day 3)  Pravastatin Sodium 40 Mg  Tabs (Pravastatin Sodium) .... Take 1 Tablet By Mouth Once A Day 4)  Levoxyl 100 Mcg  Tabs (Levothyroxine Sodium) .... Take 1/2   Tablet By Mouth Once A Day 5)  Caltrate 600+d 600-400 Mg-Unit Tabs (Calcium Carbonate-Vitamin D) .... Take 1 Tab By Mouth Two Times A Day... 6)  Vitamin D 1000 Unit Tabs (Cholecalciferol) .... Take 1 Cap By Mouth Once Daily.Marland KitchenMarland Kitchen 7)  Clorazepate Dipotassium 7.5 Mg  Tabs (Clorazepate Dipotassium) .Marland Kitchen.. 1 Tab By Mouth Three Times A Day As Directed... 8)  Tramadol Hcl 50 Mg Tabs (Tramadol Hcl) .... Take 1 Tab By Mouth Three Times A Day As Directed... 9)  Stool Softener Laxative 8.6-50 Mg Tabs (Sennosides-Docusate Sodium) .... 2 Tabs By Mouth At Bedtime 10)  Tylenol Extra Strength 500 Mg Tabs (Acetaminophen) .... Per Bottle  Allergies (verified): 1)  ! Lipitor (Atorvastatin Calcium) 2)  ! Vicodin 3)  ! Morphine  Past History:  Family History: Last updated: 05-05-09  Her mother died at age 73.  She died from an MI, but   she had also had a CVA.  Her father died at age 43 from cancer.  The  patient is not aware of any family history of cerebral aneurysms.      Social History: Last updated: 05/05/09 Married Children + smoker- 1/4 ppd at present social alcohol employ- alterations work  Risk  Factors: Smoking Status: current (08/10/2008) Packs/Day: 1/4 (08/10/2008)  Past Pulmonary History:  Pulmonary History: HYPOTHYROIDISM (ICD-244.9) - stable on LEVOTHYROID 116mcg/d... hx Hashimoto's thyroiditis in 1989 w/ markedly pos antimicrosomal antibodies and transient hyperthy labs...    ~  labs 3/10 showed TSH= 17.71... rec> incr back to 153mcg/d.  ~  labs 9/10 showed TSH= 0.21... continue same.  IRRITABLE BOWEL SYNDROME (ICD-564.1) - had colonoscopy by DrPatterson 11/06 that was WNL...  NEPHROLITHIASIS (ICD-592.0) - last stone passed in 1993, no known recurrence since then...  Hx of TENDINITIS, RIGHT ELBOW (ICD-727.09)  FIBROMYALGIA (ICD-729.1) - all prev symptoms resolved...  ~  labs 9/10 showed Sed= 24  OSTEOPENIA (ICD-733.90) - BMD here 7/09 showed TScores -0.8 in Spine, and -1.2 in right FemNeck... rec to take Calcium, MVI, Vit D.Marland KitchenMarland Kitchen  VITAMIN D DEFICIENCY (ICD-268.9)  ~  labs 7/09 showed Vit D level = 16... rec- start Vit D 50K weekly...  ~  labs 3/10 showed Vit D level = 69... rec> change to 1000 u daily.  Hx of ACOUSTIC NEUROMA (ICD-225.1) - s/p surg for this tumor 10/02 at [facility]... no known recurrence...  ~  last MRI Brain 2/07 showed s/p translabyrinthine approach to right acoustic neuroma w/o recurrence, NAD...  ANXIETY (ICD-300.00) - uses CHLORAZEPATE 7.5mg Tid... she gets claustrophobic and needs sedation before MRIs.  Review of Systems      See HPI  Vital Signs:  Patient profile:   70 year old female Height:      62.5 inches Weight:      133.13 pounds BMI:     24.05 O2 Sat:      98 % on Room air Temp:     97.9 degrees F oral Pulse rate:   68 / minute BP sitting:   118 / 74  (left arm) Cuff size:   regular  Vitals Entered By: Boone Master CNA/MA (January 27, 2010 3:21 PM)  O2 Flow:  Room air CC: right wrist pain w/ movement x1.30months Is Patient Diabetic? No Comments Medications reviewed with patient Daytime contact number verified with  patient. Boone Master CNA/MA  January 27, 2010 3:24 PM    Physical Exam  Additional Exam:  WD, WN, 70 y/o WF in NAD... GENERAL:  Alert & oriented; pleasant & cooperative. HEENT:  [place]/AT, EACs-clear, TMs clear/nmlL  NOSE-pale, clear discharge.  THROAT-clear & wnl. NECK:  Supple w/ full ROM; no JVD; normal carotid impulses w/o bruits;  no lymphadenopathy. CHEST:  Clear to P & A; without wheezes/ rales/ or rhonchi. HEART:  Regular Rhythm; gr1/6 SEM,  no rubs or gallops heard... ABDOMEN:  Soft & nontender; normal bowel sounds; no organomegaly or masses detected. EXT: without deformities, mild arthritic changes; no varicose veins/ venous insuffic/ or edema... NEURO:  CN's intact; no focal neuro deficits... gait nml Musculoskeletal: right wrist, no redness, tender along entire wrist w/ painful ROM, no significant swelling, some arthritic changes of hands. neg tinels, decreased strength/hand grips on right-pain w/ hand grips.  DERM:  No lesions noted; no rash etc...     Impression & Recommendations:  Problem # 1:  WRIST PAIN (ICD-719.43)  Right wrist pain-supsect arthritic flare will tx w/ NSAID  REC:  Begin Mobic 15mg  once daily for joint pain.  Warm heat to shoulder/wrist as needed  Tramadol 50mg  1-2 every 6 hr as needed pain follow up 2 weeks and as needed   Orders: Est. Patient Level III (04540)  Medications Added to Medication List This Visit: 1)  Tylenol Extra Strength 500 Mg Tabs (Acetaminophen) .... Per bottle 2)  Mobic 15 Mg Tabs (Meloxicam) .Marland Kitchen.. 1 by mouth once daily w/ food for joint pain  Complete Medication List: 1)  Bayer Aspirin 325 Mg Tabs (Aspirin) .... Take 1 tablet by mouth once a day 2)  Plavix 75 Mg Tabs (Clopidogrel bisulfate) .... Take 1 tablet by mouth once a day 3)  Pravastatin Sodium 40 Mg Tabs (Pravastatin sodium) .... Take 1 tablet by mouth once a day 4)  Levoxyl 100 Mcg Tabs (Levothyroxine sodium) .... Take 1/2   tablet by mouth once a day 5)   Caltrate 600+d 600-400 Mg-unit Tabs (Calcium carbonate-vitamin d) .... Take 1 tab by mouth two times a day... 6)  Vitamin D 1000 Unit Tabs (Cholecalciferol) .... Take 1 cap by mouth once daily.Marland KitchenMarland Kitchen 7)  Clorazepate Dipotassium 7.5 Mg Tabs (Clorazepate dipotassium) .Marland Kitchen.. 1 tab by mouth three times a day as directed... 8)  Tramadol Hcl 50 Mg Tabs (Tramadol hcl) .... Take 1 tab by mouth three times a day as directed... 9)  Stool Softener Laxative 8.6-50 Mg Tabs (Sennosides-docusate sodium) .... 2 tabs by mouth at bedtime 10)  Tylenol Extra Strength 500 Mg Tabs (Acetaminophen) .... Per bottle 11)  Mobic 15 Mg Tabs (Meloxicam) .Marland Kitchen.. 1 by mouth once daily w/ food for joint pain  Patient Instructions: 1)  Begin Mobic 15mg  once daily for joint pain.  2)  Warm heat to shoulder/wrist as needed  3)  Tramadol 50mg  1-2 every 6 hr as needed pain 4)  follow up 2 weeks and as needed  Prescriptions: MOBIC 15 MG TABS (MELOXICAM) 1 by mouth once daily w/ food for joint pain  #30 x 5   Entered and Authorized by:   Rubye Oaks NP   Signed by:   Chalese Peach NP on 01/27/2010   Method used:   Electronically to        [facility]  [address] [phone]* (retail)       [address]       [place], [place]  [address]       Ph: [phone] or [phone]       Fax: [phone]   RxID:   [id number]

## 2010-07-12 NOTE — Assessment & Plan Note (Signed)
Summary: 6 week follow up/lw   Primary Care Provider:  Dr. Kriste Basque   CC:  4 month ROV & review of mult medical problems....  History of Present Illness: 70 y/o WF here for a follow up visit... she has multiple medical problems as reviewed below...     ~  March 11, 2009:  she had a good 70mo until 9/16 when she had a "spell" w/ palpitations/ skipping & "lost control of my body"... they called 911 & paramedics came but she refused transport to ER... states the episode lasted 2-3 hrs & had n/v x1... there were no focal symptoms, and recurrence since that day...  she has hx Acoustic Neuroma w/ surg at [facility] in 2002- last MRI 2007 stable, no recurrence... she has kown MVP w/ prev cardiac eval in 1995 by DrStuckey- neg cath, mild MVP (see below)... mult risk factors and still smokes- last Cardiolite 1999 was neg- no infarct or ischemia, EF=66%.   ** eval w/ CXR (no change), EKG (no change), labs (NAD), & MRI Br= no infarct, no mass, clear sinuses, post-op changes right temporal & mastoid region w/o recurrent tumor; but 4x74mm basilar tip aneurysm noted>> refer to IR.   ~  July 13, 2009:  she's had an eventful 41mo w/ eval by DrDeveshwar & hosp 11/10 for stent-assisted coiling of basilar tip aneurysm- disch on ASA & Plavix but the Plavix has since been stopped... she developed incr headaches- frontal & band like, w/ occas stabbing pain in right ear... seen by TP w/ right OM- Rx w/ ZPak, Cortisporin, Nasacort AQ, & Saline mist... pt notes HAs better after sleep... denies extra stress etc... ** we discussed Rx w/ Tramadol, Chlorazepate, & refer to [facility].     Current Problems:   COPD (ICD-496) - she has a min smoker's cough, sm amt beige sputum...  ~  CXR 5/08 w/ chr changes, LLL scar, NAD.Marland Kitchen.  ~  CXR 7/09 showed norm hrt size, clear lungs, NAD.Marland Kitchen.  ~  CXR 9/10 showed NAD...  CIGARETTE SMOKER (ICD-305.1) - can't vs won't quit and not prev interested in smoking cessation help... now she states she's  decr to <1/2 ppd and will try the "commit" program...  MITRAL VALVE PROLAPSE (ICD-424.0) & Hx of CHEST PAIN (ICD-786.50) - she had CP in the past and a neg cath 1995... she takes ASA 325mg  daily...  ~  CP eval 1995 w/ cath showing min 10% luminal irreg RCA & mild MVP, norm LVF...  ~  Cardiolite 1999 was neg- no ischemia, no infarct, EF=66%...  ~  baseline EKG showed NSR, NSSTTWA (poor R prog V1-V3)...  CEREBROVASCULAR DISEASE (ICD-437.9), & ANEURYSM (ICD-442.9) - found to have a 4-60mm basialr tip aneurysm on MRI Br 10/10... referred to [facility] w/ Angiogram confirmation & subseq stent assisted coiling of the aneurysm 11/10... sher continues to f/u w/ IR/ DrDeveshwar as planned...  VENOUS INSUFFICIENCY (ICD-459.81) - she tells me that she has been seeing DrKrush for vein surgery and treatment ("it's covered by my husb's insurance")  HYPERLIPIDEMIA (ICD-272.4) - now on PRAVASTATIN 40mg /d & tol well...  ~  chart review shows TChol  ~ 200 range in the 80's and early 90's.  ~  then TChol incr to  ~250 range in late 90's and Lipitor started-   ~  FLP 5/08 on Lipitor 10mg /d showed TChol 164, TG 153, HDL 45, LDL 89.  ~  in 2009 c/o no energy & arm discomfort she felt was from the Lip10- therefore DC'd.  ~  FLP 7/09 on diet alone showed TChol 234, TG 218, HDL 40, LDL 139... rec- start Prav40.  ~  FLP 3/10 on Prav40 showed TChol 182, Tg 137, HDL 47, LDL 107  HYPOTHYROIDISM (ICD-244.9) - stable on LEVOTHYROID 145mcg/d... hx Hashimoto's thyroiditis in 1989 w/ markedly pos antimicrosomal antibodies and transient hyperthy labs...   ~  labs 5/08 showed TSH= 0.53... rec- continue Levo100/d.  ~  labs 7/09 showed TSH= 0.07... rec- decr Levothy100 to 1/2 daily.  ~  labs 3/10 showed TSH= 17.71... rec> incr back to 169mcg/d.  ~  labs 9/10 showed TSH= 0.21... continue same.  IRRITABLE BOWEL SYNDROME (ICD-564.1) - had colonoscopy by DrPatterson 11/06 that was WNL...  NEPHROLITHIASIS (ICD-592.0) - last stone  passed in 1993, no known recurrence since then...  Hx of TENDINITIS, RIGHT ELBOW (ICD-727.09)  FIBROMYALGIA (ICD-729.1) - all prev symptoms resolved...  ~  labs 9/10 showed Sed= 24  OSTEOPENIA (ICD-733.90) - BMD here 7/09 showed TScores -0.8 in Spine, and -1.2 in right FemNeck... rec to take Calcium, MVI, Vit D...  VITAMIN D DEFICIENCY (ICD-268.9)  ~  labs 7/09 showed Vit D level = 16... rec- start Vit D 50K weekly...  ~  labs 3/10 showed Vit D level = 69... rec> change to 1000 u daily.  Hx of ACOUSTIC NEUROMA (ICD-225.1) - s/p surg for this tumor 10/02 at [facility]... no known recurrence...  ~  MRI Brain 2/07 showed s/p translabyrinthine approach to right acoustic neuroma w/o recurrence, NAD...  ~  MRI Brain 10/10 showed no recurrence of tumor, but 4-107mm basilar tip aneurysm detected & Rx as above.  ANXIETY (ICD-300.00) - uses CHLORAZEPATE 7.5mg Tid... she gets claustrophobic and needs sedation before MRIs.    Allergies: 1)  ! Lipitor (Atorvastatin Calcium) 2)  ! Vicodin 3)  ! Morphine  Comments:  Nurse/Medical Assistant: The patient's medications and allergies were reviewed with the patient and were updated in the Medication and Allergy Lists.  Past History:  Past Medical History:  COPD (ICD-496) CIGARETTE SMOKER (ICD-305.1) MITRAL VALVE PROLAPSE (ICD-424.0) Hx of CHEST PAIN (ICD-786.50) PALPITATIONS, HX OF (ICD-V12.50) CEREBROVASCULAR DISEASE (ICD-437.9) ANEURYSM (ICD-442.9) VENOUS INSUFFICIENCY (ICD-459.81) HYPERLIPIDEMIA (ICD-272.4) HYPOTHYROIDISM (ICD-244.9) IRRITABLE BOWEL SYNDROME (ICD-564.1) NEPHROLITHIASIS (ICD-592.0) Hx of TENDINITIS, RIGHT ELBOW (ICD-727.09) FIBROMYALGIA (ICD-729.1) OSTEOPENIA (ICD-733.90) VITAMIN D DEFICIENCY (ICD-268.9) Hx of ACOUSTIC NEUROMA (ICD-225.1) HEADACHE (ICD-784.0) ANXIETY (ICD-300.00) INSOMNIA (ICD-780.52)  Past Surgical History:  S/P neurosurgery for acoustic neuroma -2002 at [facility] S/P hysterectomy  S/P breast  lumpectomy S/P endovascular coiling of basilar tip aneurysm 11/10 by DrDeveshwar  Family History: Reviewed history from 04/20/2009 and no changes required.  Her mother died at age 49.  She died from an MI, but   she had also had a CVA.  Her father died at age 24 from cancer.  The  patient is not aware of any family history of cerebral aneurysms.      Social History: Reviewed history from 04/20/2009 and no changes required. Married Children + smoker- 1/4 ppd at present social alcohol employ- alterations work  Review of Systems      See HPI  Vital Signs:  Patient profile:   70 year old female Height:      62.5 inches Weight:      132.13 pounds O2 Sat:      99 % on Room air Temp:     98.9 degrees F oral Pulse rate:   91 / minute BP sitting:   148 / 72  (left arm) Cuff size:   regular  Vitals  Entered By: Randell Loop CMA (July 13, 2009 2:18 PM)  O2 Sat at Rest %:  99 O2 Flow:  Room air CC: 4 month ROV & review of mult medical problems... Comments meds updated today   Physical Exam  Additional Exam:  WD, WN, 70 y/o WF in NAD... GENERAL:  Alert & oriented; pleasant & cooperative. HEENT:  [place]/AT, EOM-wnl, PERRLA, EACs-clear, TMs- some scarring on right, NOSE-clear, THROAT-clear & wnl. NECK:  Supple w/ fairROM; no JVD; normal carotid impulses w/o bruits; palp thyroid, no nodules felt; no lymphadenopathy. CHEST:  Clear to P & A; without wheezes/ rales/ or rhonchi. HEART:  Regular Rhythm; gr1/6 SEM,  no rubs or gallops heard... ABDOMEN:  Soft & nontender; normal bowel sounds; no organomegaly or masses detected. EXT: without deformities, mild arthritic changes; no varicose veins/ venous insuffic/ or edema... NEURO:  CN's intact; no focal neuro deficits... gait & station are normal. DERM:  No lesions noted; no rash etc...     MISC. Report  Procedure date:  07/13/2009  Findings:      DATA REVIEWED:   ~  MRI Brain 03/24/09...  ~  Evaluation by Rober Minion  03/31/09...  ~  Bilat cerebral arteriograms 04/26/09 w/ coiling basialr tip aneurysm...  ~  Disch summary 04/27/09...  ~  Follow up note 05/12/09...    Impression & Recommendations:  Problem # 1:  COPD (ICD-496) She must quit all smoking-  discussed w/ pt (again)...  Problem # 2:  MITRAL VALVE PROLAPSE (ICD-424.0) Cardiac stable... Her updated medication list for this problem includes:    Bayer Aspirin 325 Mg Tabs (Aspirin) .Marland Kitchen... Take 1 tablet by mouth once a day  Problem # 3:  CEREBROVASCULAR DISEASE (ICD-437.9) Data from [employer] reviewed w/ pt...   Problem # 4:  HEADACHE (ICD-784.0) Etiology of HA isn't clear to me... I doubt sinusitis... no evid recurrent Acoustic Neuroma... she's been checked by DrDeveshwar & not apparently a complic of the endovasc procedure... we discussed Rx w/ Tramadol Tid + Chlorazepate Tid, and referral to the HA management clinic... Her updated medication list for this problem includes:    Bayer Aspirin 325 Mg Tabs (Aspirin) .Marland Kitchen... Take 1 tablet by mouth once a day    Tramadol Hcl 50 Mg Tabs (Tramadol hcl) .Marland Kitchen... Take 1 tab by mouth three times a day as directed...  Orders: Headache Clinic Referral (Headache)  Problem # 5:  HYPERLIPIDEMIA (ICD-272.4) Stable-  continue same... Her updated medication list for this problem includes:    Pravastatin Sodium 40 Mg Tabs (Pravastatin sodium) .Marland Kitchen... Take 1 tablet by mouth once a day  Problem # 6:  HYPOTHYROIDISM (ICD-244.9) Stable-  continue same dose for now.. Her updated medication list for this problem includes:    Levoxyl 100 Mcg Tabs (Levothyroxine sodium) .Marland Kitchen... Take 1  tablet by mouth once a day  Problem # 7:  IRRITABLE BOWEL SYNDROME (ICD-564.1) GI is stable...  Problem # 8:  Hx of ACOUSTIC NEUROMA (ICD-225.1) No evid of recurrence on scan... Orders: Headache Clinic Referral (Headache)  Problem # 9:  OTHER MEDICAL PROBLEMS AS NOTED>>>  Complete Medication List: 1)  Bayer Aspirin 325 Mg Tabs  (Aspirin) .... Take 1 tablet by mouth once a day 2)  Pravastatin Sodium 40 Mg Tabs (Pravastatin sodium) .... Take 1 tablet by mouth once a day 3)  Levoxyl 100 Mcg Tabs (Levothyroxine sodium) .... Take 1  tablet by mouth once a day 4)  Caltrate 600+d 600-400 Mg-unit Tabs (Calcium carbonate-vitamin d) .... Take 1  tab by mouth two times a day... 5)  Vitamin D 1000 Unit Tabs (Cholecalciferol) .... Take 1 cap by mouth once daily.Marland KitchenMarland Kitchen 6)  Clorazepate Dipotassium 7.5 Mg Tabs (Clorazepate dipotassium) .Marland Kitchen.. 1 tab by mouth three times a day as directed... 7)  Tramadol Hcl 50 Mg Tabs (Tramadol hcl) .... Take 1 tab by mouth three times a day as directed...  Other Orders: Prescription Created Electronically [phone])  Patient Instructions: 1)  Today we updated your med list- see below.... 2)  Continue your current meds the same... 3)  I want you to try the Chlorazepate three times a day & the new TRAMADOL three times a day as we discussed... 4)  We will arrange for an eval in the [facility] management clinic.Marland KitchenMarland Kitchen  5)  Call for any questions... let's plan a follow up visit in 2-3 months... Prescriptions: TRAMADOL HCL 50 MG TABS (TRAMADOL HCL) take 1 tab by mouth three times a day as directed...  #90 x prn   Entered and Authorized by:   Michele Mcalpine MD   Signed by:   Michele Mcalpine MD on 07/13/2009   Method used:   Print then Give to Patient   RxID:   [id number] CLORAZEPATE DIPOTASSIUM 7.5 MG  TABS (CLORAZEPATE DIPOTASSIUM) 1 tab by mouth three times a day as directed...  #90 x prn   Entered and Authorized by:   Michele Mcalpine MD   Signed by:   Michele Mcalpine MD on 07/13/2009   Method used:   Print then Give to Patient   RxID:   [id number]

## 2010-07-12 NOTE — Assessment & Plan Note (Signed)
Summary: 1 week follow up/la   Primary Care Provider:  Dr. Kriste Basque   CC:  1 week ROV>>>.  History of Present Illness: 70 y/o Santos here for a follow up visit... she has multiple medical problems as reviewed below...     ~  March 11, 2009:  she had a good 60mo until 9/16 when she had a "spell" w/ palpitations/ skipping & "lost control of my body"... they called 911 & paramedics came but she refused transport to ER... states the episode lasted 2-3 hrs & had n/v x1... there were no focal symptoms, and recurrence since that day...  she has hx Acoustic Neuroma w/ surg at [facility] in 2002- last MRI 2007 stable, no recurrence... she has known MVP w/ prev cardiac eval in 1995 by DrStuckey- neg cath, mild MVP (see below)... mult risk factors and still smokes- last Cardiolite 1999 was neg- no infarct or ischemia, EF=66%.   ** eval w/ CXR (no change), EKG (no change), labs (NAD), & MRI Br= no infarct, no mass, clear sinuses, post-op changes right temporal & mastoid region w/o recurrent tumor; but 4x64mm basilar tip aneurysm noted>> refer to IR.   ~  July 13, 2009:  she's had an eventful 67mo w/ eval by DrDeveshwar & hosp 11/10 for stent-assisted coiling of basilar tip aneurysm- disch on ASA & Plavix but the Plavix has since been stopped... she developed incr headaches- frontal & band like, w/ occas stabbing pain in right ear... seen by TP w/ right OM- Rx w/ ZPak, Cortisporin, Nasacort AQ, & Saline mist... pt notes HAs better after sleep... denies extra stress etc... ** we discussed Rx w/ Tramadol, Chlorazepate, etc...   ~  November 19, 2009:  she developed some gait instability 2/11 & was rechecked by IR- DrDeveshwar> MRI showed right cbll lacunar infarct, & post op changes in right cerebellopontine angle, plus interval endovasc treatment of basilar tip aneurysm;  and f/u arteriogram showed angiographic obliteration of the prev basialr tip aneurysm- distal basilar art stent patent, 50% stenosis right PCA... rec to  restart PLAVIX + keep ASA 325mg /d, and quit smoking!!!  Her CC is aching & sore, low energy, etc- c/w her FM;  still smoking 1/4 ppd and can't/ won't quit despite everything.   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 she notes pain started soon after her second arteriogram from DrDeveshwar 2/11 (to eval the stent & coils placed in basilar tip aneurysm)...  tried Mobic, Tramadol, Tylenol- all w/o relief...  she saw DrReynolds 9/11- Neurology at [facility]- for f/u of the brain stem strokes seen on MRI, but he felt symptoms were more rheum related (they checked XRays/ CRP/ CPK- all norm) & he rec PT/ balance training/ vestib rehab but the pt declined "I'm gettin g my balance under control on my own & don't need therapy"...   She saw DrMorris [facility]- interventional neuroradiology- w/ another arteriogram performed 8/11 & showed satis appearance of the basilar aneurysm stent & coiling w/ some prolapse of the coils into the P1 segm w/ mild compromise of the post cerebral art (flow looks adeq & he rec continued ASA/ Plavix, quit smoking, & follow up 26yr)...   She saw DrBrowne [facility] 7/11- for f/u of her acoustic neuroma w/ f/u scan looking good- no recurrence but she's had mild infarction in right cerebellar hemisphere after her  basilar aneurysm treatment as above...   ~  April 04, 2010:  Labs last week showed norm CBC, norm BMet, norm TFTs, Sed=21, neg RA, & weakly pos ANA= 1:40 speckled... we gave her a Sterapred Dosepak (10mg  6d pack) w/ dramatic improvement in her diffuse aches/ pains (arms/ shoulders much improved)... she states that her husb had to help her get dressed x73mo due to the pain & after 2d Pred (60-50) she was able to dress herself... she has some resid/ persistant upper arm/ shoulder discomfort & restriction & we decided to continue Pred at 5mg /d for now & refer to Rheum  to help sort this out (ie- doesn't sound like PMR due to norm sed)... needs yearly f/u CXR (still smokes).   Current Problems:   COPD (ICD-496) - she has a min smoker's cough, sm amt beige sputum...  ~  CXR 5/08 w/ chr changes, LLL scar, NAD.  ~  CXR 7/09 showed norm hrt size, clear lungs, NAD.  ~  CXR 9/10 showed NAD.  ~  CXR 10/11 showed chr changes, NAD...  CIGARETTE SMOKER (ICD-305.1) - can't vs won't quit and not interested in smoking cessation help... now she states she's decr to  ~1/4 ppd but can't seem to improve from there...  MITRAL VALVE PROLAPSE (ICD-424.0) & Hx of CHEST PAIN (ICD-786.50) - she had CP in the past and a neg cath 1995...   ~  CP eval 1995 w/ cath showing min 10% luminal irreg RCA & mild MVP, norm LVF...  ~  Cardiolite 1999 was neg- no ischemia, no infarct, EF=66%...  ~  baseline EKG showed NSR, NSSTTWA (poor R prog V1-V3)...  CEREBROVASCULAR DISEASE (ICD-437.9), & ANEURYSM (ICD-442.9) - found to have a 4-43mm basilar tip aneurysm on MRI Br 10/10... referred to [facility] w/ Angiogram confirmation & subseq stent assisted coiling of the aneurysm 11/10... subseq f/u angiogram 2/11 showed complete angiographic obliteration of the aneurysm, & patent stent in distal basilar art (there was a new 50%stenosis in the right PCA)... on ASA 325mg /d & PLAVIX restarted 2/11 w/ new gait abn & cbll infarct on MRI.  ~   she saw DrMorris [facility]- interventional neuroradiology- w/ another arteriogram performed 8/11- it showed satis appearance of the basilar aneurysm stent & coiling w/ some prolapse of the coils into the P1 segm w/ mild compromise of the post cerebral art (flow looks adeq & he rec continued ASA/ Plavix therapy & f/u 20yr).  VENOUS INSUFFICIENCY (ICD-459.81) - she tells me that she saw DrKrush for vein surgery and treatment ("it's covered by my husb's insurance").  HYPERLIPIDEMIA (ICD-272.4) - now on PRAVASTATIN 40mg /d & tol well... we reviewed low chol/ low fat diet.  ~   chart review shows TChol  ~ 200 range in the 80's and early 90's.  ~  then TChol incr to  ~250 range in late 90's and Lipitor started-   ~  FLP 5/08 on Lipitor 10mg /d showed TChol 164, TG 153, HDL 45, LDL 89.  ~  in 2009 c/o no energy & arm discomfort she felt was from the Lip10- therefore DC'd.  ~  FLP 7/09 on diet alone showed TChol 234, TG 218, HDL 40, LDL 139... rec- start Prav40.  ~  FLP 3/10 on Prav40 showed TChol 182, Tg 137, HDL 47, LDL 107  ~  FLP 6/11 showed TChol 162, TG 230, HDL 37, LDL 88  HYPOTHYROIDISM (ICD-244.9) - stable on LEVOTHYROID 132mcg/d... hx Hashimoto's thyroiditis in 1989 w/ markedly pos antimicrosomal antibodies and transient  hyperthy labs...   ~  labs 5/08 showed TSH= 0.53... rec- continue Levo100/d.  ~  labs 7/09 showed TSH= 0.07... rec- decr Levothy100 to 1/2 daily.  ~  labs 3/10 showed TSH= 17.71... rec> incr back to 119mcg/d.  ~  labs 9/10 showed TSH= 0.21... continue same.  ~  labs 6/11 showed TSH= 0.10... rec decr Levo100 to 1/2 daily.  ~  labs 10/11 on Levo50 showed TSH= 1.25 (FreeT3 & FreeT4 normal)  IRRITABLE BOWEL SYNDROME (ICD-564.1) - had colon[facility] 11/[facility] that was WNL.Marland Kitchen. she notes some constipation & Rx w/ colase OTC...  NEPHROLITHIASIS (ICD-592.0) - last stone passed in 1993, no known recurrence since then...  Hx of TENDINITIS, RIGHT ELBOW (ICD-727.09) FIBROMYALGIA (ICD-729.1) - current symptoms may reflect a flair in her prev FM symptoms> but much improved on Pred Rx.  ~  labs 9/10 showed Sed= 24  ~  labs 6/11 showed Sed= 30  ~  labs 10/11 showed CBC=norm, Chems=norm, TFTs=norm, Sed=21, RF=neg, ANA+1:40 speckled...  OSTEOPENIA (ICD-733.90) - BMD here 7/09 showed TScores -0.8 in Spine, and -1.2 in right FemNeck... rec to take Calcium, MVI, Vit D...  VITAMIN D DEFICIENCY (ICD-268.9)  ~  labs 7/09 showed Vit D level = 16... rec- start Vit D 50K weekly.  ~  labs 3/10 showed Vit D level = 69... rec> change to 1000 u daily.  ~  labs  6/11 showed Vit D level = 55... continue same.  Hx of ACOUSTIC NEUROMA (ICD-225.1) - s/p surg for this tumor 10/02 at [facility]... no known recurrence...  ~  MRI Brain 2/07 showed s/p translabyrinthine approach to right acoustic neuroma w/o recurrence, NAD...  ~  MRI Brain 10/10 showed no recurrence of tumor, but 4-31mm basilar tip aneurysm detected & Rx as above.  ~  MRI Brain 2/11 showed no recurrence/ post op changes, endovasc oblit of basilar aneurysm, cerebellar lacunar infarct & 50% stenosis of right PCA... PLAVIX restarted... she had review by [facility] IR & Neurology (as above)...  ANXIETY (ICD-300.00) - uses CHLORAZEPATE 7.5mg Tid... she gets claustrophobic and needs sedation before MRIs.   Allergies: 1)  ! Lipitor (Atorvastatin Calcium) 2)  ! Vicodin 3)  ! Morphine  Comments:  Nurse/Medical Assistant: The patient's medications and allergies were reviewed with the patient and were updated in the Medication and Allergy Lists. Boone Master CNA/MA  April 04, 2010 3:37 PM   Past History:  Past Medical History: COPD (ICD-496) CIGARETTE SMOKER (ICD-305.1) MITRAL VALVE PROLAPSE (ICD-424.0) Hx of CHEST PAIN (ICD-786.50) PALPITATIONS, HX OF (ICD-V12.50) CEREBROVASCULAR DISEASE (ICD-437.9) - known 50% stenosis in right PCA on angiogram 2/11 ANEURYSM (ICD-442.9) - s/p stent assisted endovascular obliteration of basilar tip aneurysm 11/10 by DrDeveshwar VENOUS INSUFFICIENCY (ICD-459.81) HYPERLIPIDEMIA (ICD-272.4) HYPOTHYROIDISM (ICD-244.9) IRRITABLE BOWEL SYNDROME (ICD-564.1) NEPHROLITHIASIS (ICD-592.0) Hx of TENDINITIS, RIGHT ELBOW (ICD-727.09) FIBROMYALGIA (ICD-729.1) OSTEOPENIA (ICD-733.90) VITAMIN D DEFICIENCY (ICD-268.9) Hx of ACOUSTIC NEUROMA (ICD-225.1) - s/p translabyrinthine surg 2002 at [facility] DrBrowne   s/p lacunar infarct in right cerebellar hemisphere noted on MRI 2/11 HEADACHE (ICD-784.0) ANXIETY (ICD-300.00) INSOMNIA (ICD-780.52)  Past Surgical History: S/P  neurosurgery for acoustic neuroma -2002 at [facility] S/P hysterectomy  S/P breast lumpectomy S/P endovascular coiling of basilar tip aneurysm 11/10 by DrDeveshwar  Family History: Reviewed history from 03/28/2010 and no changes required.  Her mother died at age 79.  She died from an MI, but   she had also had a CVA.  Her father died at age 9 from cancer.  The  patient is not aware  of any family history of cerebral aneurysms  Social History: Reviewed history from 04/20/2009 and no changes required. Married Children + smoker- 1/4 ppd at present social alcohol employ- alterations work  Review of Systems      See HPI       The patient complains of dyspnea on exertion.  The patient denies anorexia, fever, weight loss, weight gain, vision loss, decreased hearing, hoarseness, chest pain, syncope, peripheral edema, prolonged cough, headaches, hemoptysis, abdominal pain, melena, hematochezia, severe indigestion/heartburn, hematuria, incontinence, muscle weakness, suspicious skin lesions, transient blindness, difficulty walking, depression, unusual weight change, abnormal bleeding, enlarged lymph nodes, and angioedema.    Vital Signs:  Patient profile:   70 year old female Height:      62 inches Weight:      131 pounds BMI:     24.05 O2 Sat:      98 % on Room air Temp:     99.1 degrees F oral Pulse rate:   96 / minute BP sitting:   116 / 60  (left arm) Cuff size:   regular  Vitals Entered By: Boone Master CNA/MA (April 04, 2010 3:37 PM)  O2 Flow:  Room air  Physical Exam  Additional Exam:  WD, WN, 70 y/o Santos in NAD... she is delighted w/ remarkable improvement on the Pred Rx. GENERAL:  Alert & oriented; pleasant & cooperative... HEENT:  [place]/AT, EOM-wnl, PERRLA, EACs-clear, TMs- some scarring on right, NOSE-clear, THROAT-clear & wnl. NECK:  Supple w/ fairROM; no JVD; normal carotid impulses w/o bruits; palp thyroid, no nodules felt; no lymphadenopathy. CHEST:  Clear to P & A; without  wheezes/ rales/ or rhonchi heard... HEART:  Regular Rhythm; gr1/6 SEM,  no rubs or gallops heard... ABDOMEN:  Soft & nontender; normal bowel sounds; no organomegaly or masses detected. EXT: without deformities, mild arthritic changes; no varicose veins/ venous insuffic/ or edema... +trigger points about the neck/ shoulders w/ some decr ROM shoulders... NEURO:  CN's intact; no focal neuro deficits... gait & station are normal. DERM:  No lesions noted; no rash etc...    CXR  Procedure date:  04/04/2010  Findings:      CHEST - 2 VIEW Comparison: 03/11/2009 and earlier.   Findings: Stable lung volumes.  Cardiac size and mediastinal contours are within normal limits.  Visualized tracheal air column is within normal limits.  No pneumothorax, pulmonary edema, pleural effusion, confluent or acute pulmonary opacity. No acute osseous abnormality identified.   IMPRESSION: No acute cardiopulmonary abnormality.   Read By:  Augusto Gamble,  M.D.   Impression & Recommendations:  Problem # 1:  GENERALIZED PAIN (ICD-780.96) As noted- she has had a remarkable response to Pred dosepak (10mg -6d pack)... her eval betw [facility] & labs here were all neg x for low titer ANA 1:40 speckled (non-specific, ?signif)... we discussed continuing Pred- wean to 5mg /d, and refer to rheum for their consult to help sort this out ?diagnosis... Orders: Rheumatology Referral (Rheumatology)  Problem # 2:  COPD (ICD-496) Despite every thing she continues to smoke regularly... she refuses smoking cessation help... CXR today at baseline- chr changes, NAD... Orders: T-2 View CXR ([id number])  Problem # 3:  Hx of ACOUSTIC NEUROMA (ICD-225.1) Hx acoustic neuroma- treated w/ surg 2002 at [facility]- no known recurrence...  Problem # 4:  CEREBROVASCULAR DISEASE (ICD-437.9) Hx cerebrovasc dis & basilar tip aneurysm s/p coiling w/ complic... see above discussion- she remains on ASA & Plavix, followed by IR at [facility] now...  Problem # 5:   HYPERLIPIDEMIA (  ICD-272.4) Stable on Prav40... Her updated medication list for this problem includes:    Pravastatin Sodium 40 Mg Tabs (Pravastatin sodium) .Marland Kitchen... Take 1 tablet by mouth once a day  Problem # 6:  HYPOTHYROIDISM (ICD-244.9) Stable on Levo50... Her updated medication list for this problem includes:    Levoxyl 100 Mcg Tabs (Levothyroxine sodium) .Marland Kitchen... Take 1/2   tablet by mouth once a day  Problem # 7:  Other medical problems as noted>>>  Complete Medication List: 1)  Bayer Aspirin 325 Mg Tabs (Aspirin) .... Take 1 tablet by mouth once a day 2)  Plavix 75 Mg Tabs (Clopidogrel bisulfate) .... Take 1 tablet by mouth once a day 3)  Pravastatin Sodium 40 Mg Tabs (Pravastatin sodium) .... Take 1 tablet by mouth once a day 4)  Levoxyl 100 Mcg Tabs (Levothyroxine sodium) .... Take 1/2   tablet by mouth once a day 5)  Stool Softener Laxative 8.6-50 Mg Tabs (Sennosides-docusate sodium) .... 2 tabs by mouth at bedtime 6)  Caltrate 600+d 600-400 Mg-unit Tabs (Calcium carbonate-vitamin d) .... Take 1 tab by mouth two times a day... 7)  Vitamin D 1000 Unit Tabs (Cholecalciferol) .... Take 1 cap by mouth once daily.Marland KitchenMarland Kitchen 8)  Clorazepate Dipotassium 7.5 Mg Tabs (Clorazepate dipotassium) .Marland Kitchen.. 1 tab by mouth three times a day as needed  as directed... 9)  Tramadol Hcl 50 Mg Tabs (Tramadol hcl) .... Take 1 tab by mouth three times a day as directed... 10)  Tylenol Extra Strength 500 Mg Tabs (Acetaminophen) .... Per bottle 11)  Prednisone 5 Mg Tabs (Prednisone) .... Take 1 tab by mouth once daily...  Patient Instructions: 1)  Today we updated your med list- see below.... 2)  We decided to continue the Prednisone therapy w/ 5mg  tabs- one tab each AM..Marland Kitchen 3)  Today we did a follow up CXR as well- please call the "phone tree" in a few days for your results.Marland KitchenMarland Kitchen  4)  We will arrange for a Rheumatology consultation.Marland KitchenMarland Kitchen 5)  Call for any questions.Marland KitchenMarland Kitchen 6)  Please schedule a follow-up appointment in 6  weeks. Prescriptions: PREDNISONE 5 MG TABS (PREDNISONE) take 1 tab by mouth once daily...  #90 x prn   Entered and Authorized by:   Michele Mcalpine MD   Signed by:   Michele Mcalpine MD on 04/04/2010   Method used:   Print then Give to Patient   RxID:   [id number]

## 2010-07-12 NOTE — Assessment & Plan Note (Signed)
Summary: arm pain - ok per TD / cj   Primary Care Provider:  Dr. Kriste Basque   CC:  4 month ROV & review of mult medical problems....  History of Present Illness: 70 y/o WF here for a follow up visit... she has multiple medical problems as reviewed below...     ~  March 11, 2009:  she had a good 9mo until 9/16 when she had a "spell" w/ palpitations/ skipping & "lost control of my body"... they called 911 & paramedics came but she refused transport to ER... states the episode lasted 2-3 hrs & had n/v x1... there were no focal symptoms, and recurrence since that day...  she has hx Acoustic Neuroma w/ surg at [facility] in 2002- last MRI 2007 stable, no recurrence... she has known MVP w/ prev cardiac eval in 1995 by DrStuckey- neg cath, mild MVP (see below)... mult risk factors and still smokes- last Cardiolite 1999 was neg- no infarct or ischemia, EF=66%.   ** eval w/ CXR (no change), EKG (no change), labs (NAD), & MRI Br= no infarct, no mass, clear sinuses, post-op changes right temporal & mastoid region w/o recurrent tumor; but 4x15mm basilar tip aneurysm noted>> refer to IR.   ~  July 13, 2009:  she's had an eventful 32mo w/ eval by DrDeveshwar & hosp 11/10 for stent-assisted coiling of basilar tip aneurysm- disch on ASA & Plavix but the Plavix has since been stopped... she developed incr headaches- frontal & band like, w/ occas stabbing pain in right ear... seen by TP w/ right OM- Rx w/ ZPak, Cortisporin, Nasacort AQ, & Saline mist... pt notes HAs better after sleep... denies extra stress etc... ** we discussed Rx w/ Tramadol, Chlorazepate, etc...   ~  November 19, 2009:  she developed some gait instability 2/11 & was rechecked by IR- DrDeveshwar> MRI showed right cbll lacunar infarct, & post op changes in right cerebellopontine angle, plus interval endovasc treatment of basilar tip aneurysm;  and f/u arteriogram showed angiographic obliteration of the prev basialr tip aneurysm- distal basilar art stent  patent, 50% stenosis right PCA... rec to restart PLAVIX + keep ASA 325mg /d, and quit smoking!!!  Her CC is aching & sore, low energy, etc- c/w her FM;  still smoking 1/4 ppd and can't/ won't quit despite everything.   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 she notes pain started soon after her second arteriogram from DrDeveshwar 2/11 (to eval the stent & coils placed in basilar tip aneurysm)...  tried Mobic, Tramadol, Tylenol- all w/o relief...  she saw DrReynolds 9/11- Neurology at [facility] [address]- for f/u of the brain stem strokes seen on MRI, but he felt symptoms were more rheum related (they checked XRays/ CRP/ CPK- all norm) & he rec PT/ balance training/ vestib rehab but the pt declined "I'm gettin g my balance under control on my own & don't need therapy"...   She saw DrMorris [facility]- interventional neuroradiology- w/ another arteriogram performed 8/11 & showed satis appearance of the basilar aneurysm stent & coiling w/ some prolapse of the coils into the P1 segm w/ mild compromise of the post cerebral art (flow looks adeq & he rec continued ASA/ Plavix, quit smoking, & follow up 83yr)...   She saw DrBrowne [facility] 7/11- for f/u of her acoustic neuroma w/ f/u scan looking good- no recurrence but  she's had mild infarction in right cerebellar hemisphere after her basilar aneurysm treatment as above...   Current Problems:   COPD (ICD-496) - she has a min smoker's cough, sm amt beige sputum...  ~  CXR 5/08 w/ chr changes, LLL scar, NAD.Marland Kitchen.  ~  CXR 7/09 showed norm hrt size, clear lungs, NAD.Marland Kitchen.  ~  CXR 9/10 showed NAD...  CIGARETTE SMOKER (ICD-305.1) - can't vs won't quit and not interested in smoking cessation help... now she states she's decr to  ~1/4 ppd but can't seem to improve from there...  MITRAL VALVE PROLAPSE (ICD-424.0) & Hx of CHEST PAIN (ICD-786.50) - she had CP in  the past and a neg cath 1995... she takes ASA 325mg  daily...  ~  CP eval 1995 w/ cath showing min 10% luminal irreg RCA & mild MVP, norm LVF...  ~  Cardiolite 1999 was neg- no ischemia, no infarct, EF=66%...  ~  baseline EKG showed NSR, NSSTTWA (poor R prog V1-V3)...  CEREBROVASCULAR DISEASE (ICD-437.9), & ANEURYSM (ICD-442.9) - found to have a 4-84mm basilar tip aneurysm on MRI Br 10/10... referred to [facility] w/ Angiogram confirmation & subseq stent assisted coiling of the aneurysm 11/10... subseq f/u angiogram 2/11 showed complete angiographic obliteration of the aneurysm, & patent stent in distal basilar art (there was a new 50%stenosis in the right PCA)... on ASA 325mg /d & PLAVIX restarted 2/11 w/ new gait abn & cbll infarct on MRI.  ~   she saw DrMorris [facility]- interventional neuroradiology- w/ another arteriogram performed 8/11- it showed satis appearance of the basilar aneurysm stent & coiling w/ some prolapse of the coils into the P1 segm w/ mild compromise of the post cerebral art (flow looks adeq & he rec continued ASA/ Plavix therapy & f/u 13yr...  VENOUS INSUFFICIENCY (ICD-459.81) - she tells me that she saw DrKrush for vein surgery and treatment ("it's covered by my husb's insurance")...  HYPERLIPIDEMIA (ICD-272.4) - now on PRAVASTATIN 40mg /d & tol well...  ~  chart review shows TChol  ~ 200 range in the 80's and early 90's.  ~  then TChol incr to  ~250 range in late 90's and Lipitor started-   ~  FLP 5/08 on Lipitor 10mg /d showed TChol 164, TG 153, HDL 45, LDL 89.  ~  in 2009 c/o no energy & arm discomfort she felt was from the Lip10- therefore DC'd.  ~  FLP 7/09 on diet alone showed TChol 234, TG 218, HDL 40, LDL 139... rec- start Prav40.  ~  FLP 3/10 on Prav40 showed TChol 182, Tg 137, HDL 47, LDL 107  ~  FLP 6/11 showed TChol 162, TG 230, HDL 37, LDL 88  HYPOTHYROIDISM (ICD-244.9) - stable on LEVOTHYROID 126mcg/d... hx Hashimoto's thyroiditis in 1989 w/ markedly pos antimicrosomal  antibodies and transient hyperthy labs...   ~  labs 5/08 showed TSH= 0.53... rec- continue Levo100/d.  ~  labs 7/09 showed TSH= 0.07... rec- decr Levothy100 to 1/2 daily.  ~  labs 3/10 showed TSH= 17.71... rec> incr back to 142mcg/d.  ~  labs 9/10 showed TSH= 0.21... continue same.  ~  labs 6/11 showed TSH= 0.10... rec decr Levo100 to 1/2 daily.  IRRITABLE BOWEL SYNDROME (ICD-564.1) - had colonoscopy by DrPatterson 11/06 that was WNL.Marland Kitchen. she notes some constipation & Rx w/ colase OTC...  NEPHROLITHIASIS (ICD-592.0) - last stone passed in 1993, no known recurrence since then...  Hx of TENDINITIS, RIGHT ELBOW (ICD-727.09) FIBROMYALGIA (ICD-729.1) - current symptoms may reflect a flair in her prev FM  symptoms>   ~  labs 9/10 showed Sed= 24  ~  labs 6/11 showed Sed= 30  ~  labs 10/11 showed CBC=norm, Chems=norm, TFTs=norm, Sed=21, RF=neg, ANA+1:40 speckled...  OSTEOPENIA (ICD-733.90) - BMD here 7/09 showed TScores -0.8 in Spine, and -1.2 in right FemNeck... rec to take Calcium, MVI, Vit D...  VITAMIN D DEFICIENCY (ICD-268.9)  ~  labs 7/09 showed Vit D level = 16... rec- start Vit D 50K weekly.  ~  labs 3/10 showed Vit D level = 69... rec> change to 1000 u daily.  ~  labs 6/11 showed Vit D level = 55... continue same.  Hx of ACOUSTIC NEUROMA (ICD-225.1) - s/p surg for this tumor 10/02 at [facility]... no known recurrence...  ~  MRI Brain 2/07 showed s/p translabyrinthine approach to right acoustic neuroma w/o recurrence, NAD...  ~  MRI Brain 10/10 showed no recurrence of tumor, but 4-106mm basilar tip aneurysm detected & Rx as above.  ~  MRI Brain 2/11 showed no recurrence/ post op changes, endovasc oblit of basilar aneurysm, cerebellar lacunar infarct & 50% stenosis of right PCA... PLAVIX restarted... she had review by [facility] IR & Neurology (as above)...  ANXIETY (ICD-300.00) - uses CHLORAZEPATE 7.5mg Tid... she gets claustrophobic and needs sedation before MRIs.   Current Medications  (verified): 1)  Bayer Aspirin 325 Mg Tabs (Aspirin) .... Take 1 Tablet By Mouth Once A Day 2)  Plavix 75 Mg Tabs (Clopidogrel Bisulfate) .... Take 1 Tablet By Mouth Once A Day 3)  Pravastatin Sodium 40 Mg  Tabs (Pravastatin Sodium) .... Take 1 Tablet By Mouth Once A Day 4)  Levoxyl 100 Mcg  Tabs (Levothyroxine Sodium) .... Take 1/2   Tablet By Mouth Once A Day 5)  Caltrate 600+d 600-400 Mg-Unit Tabs (Calcium Carbonate-Vitamin D) .... Take 1 Tab By Mouth Two Times A Day... 6)  Vitamin D 1000 Unit Tabs (Cholecalciferol) .... Take 1 Cap By Mouth Once Daily.Marland KitchenMarland Kitchen 7)  Clorazepate Dipotassium 7.5 Mg  Tabs (Clorazepate Dipotassium) .Marland Kitchen.. 1 Tab By Mouth Three Times A Day As Directed... 8)  Tramadol Hcl 50 Mg Tabs (Tramadol Hcl) .... Take 1 Tab By Mouth Three Times A Day As Directed... 9)  Stool Softener Laxative 8.6-50 Mg Tabs (Sennosides-Docusate Sodium) .... 2 Tabs By Mouth At Bedtime 10)  Tylenol Extra Strength 500 Mg Tabs (Acetaminophen) .... Per Bottle  Allergies: 1)  ! Lipitor (Atorvastatin Calcium) 2)  ! Vicodin 3)  ! Morphine  Family History: Reviewed history from 04/20/2009 and no changes required.  Her mother died at age 14.  She died from an MI, but   she had also had a CVA.  Her father died at age 26 from cancer.  The  patient is not aware of any family history of cerebral aneurysms     Social History: Reviewed history from 04/20/2009 and no changes required. Married Children + smoker- 1/4 ppd at present social alcohol employ- alterations work  Review of Systems      See HPI       The patient complains of chest pain, dyspnea on exertion, and headaches.  The patient denies anorexia, fever, weight loss, weight gain, vision loss, decreased hearing, hoarseness, syncope, peripheral edema, prolonged cough, hemoptysis, abdominal pain, melena, hematochezia, severe indigestion/heartburn, hematuria, incontinence, muscle weakness, suspicious skin lesions, transient blindness, difficulty  walking, depression, unusual weight change, abnormal bleeding, enlarged lymph nodes, and angioedema.         Her CC is aching in the muscles, esp arms, w/ decr ROM  shoulders, stiffness, & various other fleeting/ migratory discomforts that are hard for her to characterize but rather severe by her hx & she is quite distressed by these symptoms...  Vital Signs:  Patient profile:   70 year old female Height:      62 inches Weight:      130 pounds BMI:     23.86 O2 Sat:      100 % on Room air Temp:     98.3 degrees F oral Pulse rate:   64 / minute BP sitting:   118 / 66  (right arm) Cuff size:   regular  Vitals Entered By: Kandice Hams CMA (March 28, 2010 12:06 PM)  O2 Flow:  Room air CC: 4 month ROV & review of mult medical problems... Comments pt d/c Mobic aftter taking 1 month took 1 bottle (says did not help) Pharmacy verfied   Physical Exam  Additional Exam:  WD, WN, 70 y/o WF in NAD... she is mod distressed by her discomfort & limitations. GENERAL:  Alert & oriented; pleasant & cooperative. HEENT:  /AT, EOM-wnl, PERRLA, EACs-clear, TMs- some scarring on right, NOSE-clear, THROAT-clear & wnl. NECK:  Supple w/ fairROM; no JVD; normal carotid impulses w/o bruits; palp thyroid, no nodules felt; no lymphadenopathy. CHEST:  Clear to P & A; without wheezes/ rales/ or rhonchi heard... HEART:  Regular Rhythm; gr1/6 SEM,  no rubs or gallops heard... ABDOMEN:  Soft & nontender; normal bowel sounds; no organomegaly or masses detected. EXT: without deformities, mild arthritic changes; no varicose veins/ venous insuffic/ or edema... +trigger points about the neck/ shoulders w/ some decr ROM shoulders... NEURO:  CN's intact; no focal neuro deficits... gait & station are normal. DERM:  No lesions noted; no rash etc...    MISC. Report  Procedure date:  03/28/2010  Findings:      BMP (METABOL)   Sodium                    144 mEq/L                   135-145   Potassium                  4.3 mEq/L                   3.5-5.1   Chloride                  108 mEq/L                   96-112   Carbon Dioxide            26 mEq/L                    19-32   Glucose                   80 mg/dL                    16-10   BUN                       15 mg/dL                    9-60   Creatinine                0.7 mg/dL  0.4-1.2   Calcium                   9.8 mg/dL                   1.6-10.9   GFR                       94.32 mL/min                >60  TSH (TSH)   FastTSH                   1.25 uIU/mL                 0.35-5.50  T3, Free (T3FREE)   Free T3                   2.9 pg/mL                   2.3-4.2   T4, Free (FT4R)   Free T4                   1.19 ng/dL                  0.60-1.60   CBC Platelet w/Diff (CBCD)   White Cell Count     [H]  10.6 K/uL                   4.5-10.5   Red Cell Count            4.53 Mil/uL                 3.87-5.11   Hemoglobin                14.2 g/dL                   60.4-54.0   Hematocrit                42.1 %                      36.0-46.0   MCV                       92.9 fl                     78.0-100.0   Platelet Count            258.0 K/uL                  150.0-400.0   Neutrophil %              63.6 %                      43.0-77.0   Lymphocyte %              30.2 %                      12.0-46.0   Monocyte %                4.6 %                       3.0-12.0   Eosinophils%  1.5 %                       0.0-5.0   Basophils %               0.1 %                       0.0-3.0  Sed Rate (ESR)   Sed Rate                  21 mm/hr                    0-22  Comments:      Rheumatoid (RA) Factor (14782)  Rheumatoid (RA) Factor                             < 20 IU/mL                  0-20  Anti Nuclear Antibody (ANA) Reflex (23900)  Anti Nuclear Antibody (ANA)                        [A]  POS                         NEGATIVE  ANA Titer and Pattern (95621)   ANA Titer            [H]  1:40                         <1:40     Reference Ranges:     1:40 - 1:80 Weakly positive, usually not clinically significant.     > or = to 1:160 Result may be clinically significant.   ANA Pattern               SPECKLED   Impression & Recommendations:  Problem # 1:  GENERALIZED PAIN (ICD-780.96) ?Etiology> poss fibromyalgia related? & result of recent collagen-vasc screen is largely neg> neg CPK, CRP, Sed, routine labs as well... the low titer ANA is nonspecific & we will follow... we discussed brief trial Pred Rx to observe response & consider Rheum referral vs chr pain Rx... Orders: TLB-BMP (Basic Metabolic Panel-BMET) (80048-METABOL) TLB-TSH (Thyroid Stimulating Hormone) (84443-TSH) TLB-T3, Free (Triiodothyronine) (84481-T3FREE) TLB-T4 (Thyrox), Free [phone]) TLB-CBC Platelet - w/Differential (85025-CBCD) TLB-Sedimentation Rate (ESR) (85652-ESR) T-Antinuclear Antib (ANA) (96295-28413) T-Rheumatoid Factor (24401-02725)  Problem # 2:  COPD (ICD-496) She MUST quit smoking but again is totally not motivated to do so... offered Chantix etc... Orders: Depo- Medrol 80mg  (J1040) Admin of Therapeutic Inj  intramuscular or subcutaneous (36644)  Problem # 3:  CEREBROVASCULAR DISEASE (ICD-437.9) Apprec expertise & evals from [facility] DrBrowne, DrMorris, et al... she is to continue ASA/ PLAVIX, needs to quit smoking!!!  Problem # 4:  Hx of ACOUSTIC NEUROMA (ICD-225.1) No signs of recurrence & followed by DrBrowne...  Problem # 5:  LACUNAR INFARCTION (ICD-434.91) As noted by DrReynolds at [facility] Neurology division, this occured after her Basilar art aneurysm stent & coiling... she is to continue ASA/ Plavix... she declined offers for Rehab etc... Her updated medication list for this problem includes:    Bayer Aspirin 325 Mg Tabs (Aspirin) .Marland Kitchen... Take 1 tablet by mouth once a day    Plavix 75 Mg Tabs (Clopidogrel bisulfate) .Marland Kitchen... Take 1 tablet by  mouth once a day  Problem # 6:  HYPERLIPIDEMIA (ICD-272.4) Stable on  Prav40... continue same. Her updated medication list for this problem includes:    Pravastatin Sodium 40 Mg Tabs (Pravastatin sodium) .Marland Kitchen... Take 1 tablet by mouth once a day  Problem # 7:  HYPOTHYROIDISM (ICD-244.9) Recent f/u TFT's all WNL>  continue same dose, & pt reminded to take regularly- every day! Her updated medication list for this problem includes:    Levoxyl 100 Mcg Tabs (Levothyroxine sodium) .Marland Kitchen... Take 1/2   tablet by mouth once a day  Problem # 8:  FIBROMYALGIA (ICD-729.1) Aware- this is the likely etiology of her discomfort... we discussed referral to Rheum after trial Pred Rx. The following medications were removed from the medication list:    Mobic 15 Mg Tabs (Meloxicam) .Marland Kitchen... 1 by mouth once daily w/ food for joint pain Her updated medication list for this problem includes:    Bayer Aspirin 325 Mg Tabs (Aspirin) .Marland Kitchen... Take 1 tablet by mouth once a day    Tramadol Hcl 50 Mg Tabs (Tramadol hcl) .Marland Kitchen... Take 1 tab by mouth three times a day as directed...    Tylenol Extra Strength 500 Mg Tabs (Acetaminophen) .Marland Kitchen... Per bottle  Problem # 9:  OTHER MEDICAL ISSUES AS NOTED>>>  Complete Medication List: 1)  Bayer Aspirin 325 Mg Tabs (Aspirin) .... Take 1 tablet by mouth once a day 2)  Plavix 75 Mg Tabs (Clopidogrel bisulfate) .... Take 1 tablet by mouth once a day 3)  Pravastatin Sodium 40 Mg Tabs (Pravastatin sodium) .... Take 1 tablet by mouth once a day 4)  Levoxyl 100 Mcg Tabs (Levothyroxine sodium) .... Take 1/2   tablet by mouth once a day 5)  Stool Softener Laxative 8.6-50 Mg Tabs (Sennosides-docusate sodium) .... 2 tabs by mouth at bedtime 6)  Caltrate 600+d 600-400 Mg-unit Tabs (Calcium carbonate-vitamin d) .... Take 1 tab by mouth two times a day... 7)  Vitamin D 1000 Unit Tabs (Cholecalciferol) .... Take 1 cap by mouth once daily.Marland KitchenMarland Kitchen 8)  Clorazepate Dipotassium 7.5 Mg Tabs (Clorazepate dipotassium) .Marland Kitchen.. 1 tab by mouth three times a day as directed... 9)  Tramadol Hcl  50 Mg Tabs (Tramadol hcl) .... Take 1 tab by mouth three times a day as directed... 10)  Tylenol Extra Strength 500 Mg Tabs (Acetaminophen) .... Per bottle 11)  Prednisone (pak) 10 Mg Tabs (Prednisone) .... Take as directed til gone...  Patient Instructions: 1)  Today we updated your med list- see below.... 2)  We decided to give you a course of cortisone therapy- Depo shot today, & start the Sterapred dosepak tomorrow 10/18 til gone... 3)  Today we also did some additional blood work... please call the "phone tree" in a few days for your lab results.Marland KitchenMarland Kitchen 4)  We will plan short term follow up in one week & determin the response to the cortisone & consider our next step (possibly a pain patch)... 5)  Call for any questions... Prescriptions: PREDNISONE (PAK) 10 MG TABS (PREDNISONE) take as directed til gone...  #10mg  6d pack x 0   Entered and Authorized by:   Michele Mcalpine MD   Signed by:   Michele Mcalpine MD on 03/28/2010   Method used:   Print then Give to Patient   RxID:   [id number]    Medication Administration  Injection # 1:    Medication: Depo- Medrol 80mg     Diagnosis: COPD (ICD-496)    Route: IM    Site:  LUOQ gluteus    Exp Date: 09/2012    Lot #: [id number]    Mfr: [employer]    Patient tolerated injection without complications    Given by: Randell Loop CMA (March 28, 2010 5:30 PM)  Orders Added: 1)  Est. Patient Level IV [98119] 2)  TLB-BMP (Basic Metabolic Panel-BMET) [80048-METABOL] 3)  TLB-TSH (Thyroid Stimulating Hormone) [84443-TSH] 4)  TLB-T3, Free (Triiodothyronine) [14782-N5AOZH] 5)  TLB-T4 (Thyrox), Free [08657-QI6N] 6)  TLB-CBC Platelet - w/Differential [85025-CBCD] 7)  TLB-Sedimentation Rate (ESR) [85652-ESR] 8)  T-Antinuclear Antib (ANA) [62952-84132] 9)  T-Rheumatoid Factor [44010-27253] 10)  Depo- Medrol 80mg  [J1040] 11)  Admin of Therapeutic Inj  intramuscular or subcutaneous [66440]

## 2010-07-12 NOTE — Progress Notes (Signed)
Summary: fax request  Phone Note Call from Patient   Caller: Patient Call For: nadel Summary of Call: pt [facility] needs a copy of her MRI and EKG. (she didn't have a fax #). pt's # [phone]. pt appt w/ them is on mon 2/14/ 2011 Initial call taken by: Tivis Ringer, CNA,  July 22, 2009 9:36 AM  Follow-up for Phone Call        libby will look at [facility] and send what is needed Follow-up by: Philipp Deputy CMA,  July 22, 2009 9:39 AM  Additional Follow-up for Phone Call Additional follow up Details #1::        spoke to pt mri and [facility]  Additional Follow-up by: Oneita Jolly,  July 22, 2009 12:52 PM

## 2010-07-12 NOTE — Letter (Signed)
Summary: [facility]  Arteriogram/[facility]   Imported By: Lester  03/01/2010 09:31:45  _____________________________________________________________________  External Attachment:    Type:   Image     Comment:   External Document

## 2010-07-12 NOTE — Letter (Signed)
Summary: [facility]  [facility]   Imported By: Lennie Odor 03/09/2010 13:52:44  _____________________________________________________________________  External Attachment:    Type:   Image     Comment:   External Document  Appended Document: [facility] per SN----we recieved [facility] note to [place] and they wanted [place] to schedule pt for PT and balance training---pt refused this due to her feeling like she is getting better with her balance and can do this from home.  SN is aware

## 2010-07-12 NOTE — Progress Notes (Signed)
Summary: arm pain > ov scheduled  Phone Note Call from Patient Call back at Home Phone [phone]   Caller: Patient Call For: nadel Summary of Call: pt was seen by tp on 8/18. says the meds prescribed for her (for her arm pain) don't help. says this is "not joint pain". she has been to [facility] for same. nothing has helped. wants to know what can be done. she has constant pain in both arms- unable to even dress herself.  Initial call taken by: Tivis Ringer, CNA,  March 21, 2010 11:27 AM  Follow-up for Phone Call        called and spoke with pt.  pt states she saw TP back on 01/27/2010 for arm pt.  Tp prescribed Mobic and Tramadol.  Pt states she hasn't gotten any relief from these meds.  Pt states the pain isn't coming "from her joints"  states it's in her muscles of both arms.  Pt also c/o R wrist pain which swells throughout the day.  pt denied any trauma or injury to her arms.  pt states the pain is esp worse in the mornings and has difficulty getting dressed and raising her arms up.  Pt states she has an appt scheduled with SN for 05/23/2010 and wants to be seen sooner but does not want to see TP.  Will forward message to SN to address. Arman Filter LPN  March 21, 2010 5:17 PM   Additional Follow-up for Phone Call Additional follow up Details #1::        Per TD, offer pt 03/28/10 at 12pm.   Gweneth Dimitri RN  March 22, 2010 9:54 AM  Called, spoke with pt.  She is ok with the above appt.  Appt scheduled in [facility] -- pt aware.   Gweneth Dimitri RN  March 22, 2010 9:56 AM

## 2010-07-12 NOTE — Letter (Signed)
Summary: Otolaryngology/[facility]  Otolaryngology/[facility]   Imported By: Sherian Rein 01/12/2010 14:12:48  _____________________________________________________________________  External Attachment:    Type:   Image     Comment:   External Document

## 2010-07-14 ENCOUNTER — Telehealth (INDEPENDENT_AMBULATORY_CARE_PROVIDER_SITE_OTHER): Payer: Self-pay | Admitting: *Deleted

## 2010-07-14 NOTE — Progress Notes (Signed)
Summary: REFILL  Phone Note Call from Patient Call back at [facility] Phone [phone]   Caller: Patient Call For: Catharine Kettlewell Summary of Call: NEEDS CLORAZAPATE FAXED TO [facility] FOR THEM TO FILL FAX [phone] Initial call taken by: Lacinda Axon,  June 30, 2010 12:24 PM  Follow-up for Phone Call        rx has been faxed to the pharmacy per pts request.  lmom to make pt aware Randell Loop CMA  June 30, 2010 3:21 PM     Prescriptions: CLORAZEPATE DIPOTASSIUM 7.5 MG  TABS (CLORAZEPATE DIPOTASSIUM) 1 tab by mouth three times a day as needed  as directed...  #180 x 3   Entered by:   Randell Loop CMA   Authorized by:   Michele Mcalpine MD   Signed by:   Randell Loop CMA on 06/30/2010   Method used:   Print then Give to Patient   RxID:   [id number]

## 2010-07-14 NOTE — Letter (Signed)
Summary: Stacey Drain MD  Stacey Drain MD   Imported By: Sherian Rein 06/29/2010 07:59:46  _____________________________________________________________________  External Attachment:    Type:   Image     Comment:   External Document

## 2010-07-14 NOTE — Letter (Signed)
Summary: Aundra Dubin MD  Aundra Dubin MD   Imported By: Lester [place]ieve 06/02/2010 11:02:44  _____________________________________________________________________  External Attachment:    Type:   Image     Comment:   External Document

## 2010-07-14 NOTE — Assessment & Plan Note (Signed)
Summary: 6 month return/mhh   Primary Care Ana Santos:  Dr. Kriste Basque   CC:  6 week ROV & review....  History of Present Illness: 70 y/o WF here for a follow up visit... she has multiple medical problems as reviewed below...     ~  November 19, 2009:  she developed some gait instability 2/11 & was rechecked by IR- DrDeveshwar> MRI showed right cbll lacunar infarct, & post op changes in right cerebellopontine angle, plus interval endovasc treatment of basilar tip aneurysm;  and f/u arteriogram showed angiographic obliteration of the prev basialr tip aneurysm- distal basilar art stent patent, 50% stenosis right PCA... rec to restart PLAVIX + keep ASA 325mg /d, and quit smoking!!!  Her CC is aching & sore, low energy, etc- c/w her FM;  still smoking 1/4 ppd and can't/ won't quit despite everything.   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 she notes pain started soon after her second arteriogram from DrDeveshwar 2/11 (to eval the stent & coils placed in basilar tip aneurysm)...  tried Mobic, Tramadol, Tylenol- all w/o relief...  she saw DrReynolds 9/11- Neurology at [facility]- for f/u of the brain stem strokes seen on MRI, but he felt symptoms were more rheum related (they checked XRays/ CRP/ CPK- all norm) & he rec PT/ balance training/ vestib rehab but the pt declined "I'm getting my balance under control on my own & don't need therapy"...   She saw DrMorris [facility]- interventional neuroradiology- w/ another arteriogram performed 8/11 & showed satis appearance of the basilar aneurysm stent & coiling w/ some prolapse of the coils into the P1 segm w/ mild compromise of the post cerebral art (flow looks adeq & he rec continued ASA/ Plavix, quit smoking, & follow up 5yr)...   She saw DrBrowne [facility] 7/11- for f/u of her acoustic neuroma w/ f/u scan looking good- no recurrence but she's had  mild infarction in right cerebellar hemisphere after her basilar aneurysm treatment as above...   ~  April 04, 2010:  Labs last week showed norm CBC, norm BMet, norm TFTs, Sed=21, neg RA, & weakly pos ANA= 1:40 speckled... we gave her a Sterapred Dosepak (10mg  6d pack) w/ dramatic improvement in her diffuse aches/ pains (arms/ shoulders much improved)... she states that her husb had to help her get dressed x75mo due to the pain & after 2d Pred (60-50) she was able to dress herself... she has some resid/ persistant upper arm/ shoulder discomfort & restriction & we decided to continue Pred at 5mg /d for now & refer to Rheum to help sort this out (ie- doesn't sound like PMR due to norm sed)... needs yearly f/u CXR- still smokes- stable, NAD.Marland Kitchen.   ~  May 23, 2010:  She saw DrTruslow for rheum eval & he stopped her Pred, otherw no ch in meds, gave her a shot in shoulder & plans continued follow up (she is encouraged to call him for symptoms for his input & on-going care)... despite everything she continues to smoke 5-6 cig/d... mult somatic complaints on & off> using Tramadol, Tylenol...  OK 2011 Flu vaccine today.   Current Problems:   COPD (ICD-496) - she has a min smoker's cough, sm amt beige sputum...  ~  CXR 5/08 w/ chr changes, LLL scar, NAD.  ~  CXR 7/09 showed norm hrt size, clear lungs, NAD.  ~  CXR 9/10 showed NAD.  ~  CXR 10/11 showed chr changes, NAD...  CIGARETTE SMOKER (ICD-305.1) - can't vs won't quit and not interested in smoking cessation help... now she states she's decr to  ~1/4 ppd but can't seem to improve from there...  MITRAL VALVE PROLAPSE (ICD-424.0) & Hx of CHEST PAIN (ICD-786.50) - she had CP in the past and a neg cath 1995...   ~  CP eval 1995 w/ cath showing min 10% luminal irreg RCA & mild MVP, norm LVF...  ~  Cardiolite 1999 was neg- no ischemia, no infarct, EF=66%...  ~  baseline EKG showed NSR, NSSTTWA (poor R prog V1-V3)...  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 referred to [facility] w/ Angiogram confirmation & subseq stent assisted coiling of the aneurysm 11/10... subseq f/u angiogram 2/11 showed complete angiographic obliteration of the aneurysm, & patent stent in distal basilar art (there was a new 50%stenosis in the right PCA)... on ASA 325mg /d & PLAVIX restarted 2/11 w/ new gait abn & cbll infarct on MRI.  ~   she saw DrMorris [facility]- interventional neuroradiology- w/ another arteriogram performed 8/11- it showed satis appearance of the basilar aneurysm stent & coiling w/ some prolapse of the coils into the P1 segm w/ mild compromise of the post cerebral art (flow looks adeq & he rec continued ASA/ Plavix therapy & f/u 26yr).  VENOUS INSUFFICIENCY (ICD-459.81) - she tells me that she saw DrKrush for vein surgery and treatment ("it's covered by my husb's insurance").  HYPERLIPIDEMIA (ICD-272.4) - now on PRAVASTATIN 40mg /d & tol well... we reviewed low chol/ low fat diet.  ~  chart review shows TChol  ~ 200 range in the 80's and early 90's.  ~  then TChol incr to  ~250 range in late 90's and Lipitor started-   ~  FLP 5/08 on Lipitor 10mg /d showed TChol 164, TG 153, HDL 45, LDL 89.  ~  in 2009 c/o no energy & arm discomfort she felt was from the Lip10- therefore DC'd.  ~  FLP 7/09 on diet alone showed TChol 234, TG 218, HDL 40, LDL 139... rec- start Prav40.  ~  FLP 3/10 on Prav40 showed TChol 182, Tg 137, HDL 47, LDL 107  ~  FLP 6/11 showed TChol 162, TG 230, HDL 37, LDL 88  HYPOTHYROIDISM (ICD-244.9) - stable on LEVOTHYROID 155mcg/d... hx Hashimoto's thyroiditis in 1989 w/ markedly pos antimicrosomal antibodies and transient hyperthy labs...   ~  labs 5/08 showed TSH= 0.53... rec- continue Levo100/d.  ~  labs 7/09 showed TSH= 0.07... rec- decr Levothy100 to 1/2 daily.  ~  labs 3/10 showed TSH= 17.71... rec> incr back to 155mcg/d.  ~  labs 9/10 showed TSH= 0.21...  continue same.  ~  labs 6/11 showed TSH= 0.10... rec decr Levo100 to 1/2 daily.  ~  labs 10/11 on Levo50 showed TSH= 1.25 (FreeT3 & FreeT4 normal)  IRRITABLE BOWEL SYNDROME (ICD-564.1) - had colonoscopy by [facility] 11/06 that was WNL.Marland Kitchen. she notes some constipation & Rx w/ colase OTC...  NEPHROLITHIASIS (ICD-592.0) - last stone passed in 1993, no known recurrence since then...  Hx of TENDINITIS, RIGHT ELBOW (ICD-727.09) FIBROMYALGIA (ICD-729.1) - current symptoms may reflect a flair in her prev FM symptoms> but much improved on Pred Rx.  ~  labs 9/10 showed Sed= 24  ~  labs 6/11 showed Sed= 30  ~  labs 10/11 showed CBC=norm, Chems=norm, TFTs=norm, Sed=21, RF=neg,  ANA+1:40 speckled...  ~  12/11:  pt saw DrTruslow & his note is pending- she indicates her stopped her Pred, & gave her shot in shoulder.  OSTEOPENIA (ICD-733.90) - BMD here 7/09 showed TScores -0.8 in Spine, and -1.2 in right FemNeck... rec to take Calcium, MVI, Vit D...  VITAMIN D DEFICIENCY (ICD-268.9)  ~  labs 7/09 showed Vit D level = 16... rec- start Vit D 50K weekly.  ~  labs 3/10 showed Vit D level = 69... rec> change to 1000 u daily.  ~  labs 6/11 showed Vit D level = 55... continue same.  Hx of ACOUSTIC NEUROMA (ICD-225.1) - s/p surg for this tumor 10/02 at [facility]... no known recurrence...  ~  MRI Brain 2/07 showed s/p translabyrinthine approach to right acoustic neuroma w/o recurrence, NAD...  ~  MRI Brain 10/10 showed no recurrence of tumor, but 4-31mm basilar tip aneurysm detected & Rx as above.  ~  MRI Brain 2/11 showed no recurrence/ post op changes, endovasc oblit of basilar aneurysm, cerebellar lacunar infarct & 50% stenosis of right PCA... PLAVIX restarted... she had review by [facility] IR & Neurology (as above)...  ANXIETY (ICD-300.00) - uses CHLORAZEPATE 7.5mg Tid... she gets claustrophobic and needs sedation before MRIs.   Preventive Screening-Counseling & Management  Alcohol-Tobacco     Smoking Status:  current     Packs/Day: <0.25  Current Medications (verified): 1)  Bayer Aspirin 325 Mg Tabs (Aspirin) .... Take 1 Tablet By Mouth Once A Day 2)  Plavix 75 Mg Tabs (Clopidogrel Bisulfate) .... Take 1 Tablet By Mouth Once A Day 3)  Pravastatin Sodium 40 Mg  Tabs (Pravastatin Sodium) .... Take 1 Tablet By Mouth Once A Day 4)  Levoxyl 100 Mcg  Tabs (Levothyroxine Sodium) .... Take 1/2   Tablet By Mouth Once A Day 5)  Stool Softener Laxative 8.6-50 Mg Tabs (Sennosides-Docusate Sodium) .... 2 Tabs By Mouth At Bedtime 6)  Caltrate 600+d 600-400 Mg-Unit Tabs (Calcium Carbonate-Vitamin D) .... Take 1 Tab By Mouth Two Times A Day... 7)  Vitamin D 1000 Unit Tabs (Cholecalciferol) .... Take 1 Cap By Mouth Once Daily.Marland KitchenMarland Kitchen 8)  Clorazepate Dipotassium 7.5 Mg  Tabs (Clorazepate Dipotassium) .Marland Kitchen.. 1 Tab By Mouth Three Times A Day As Needed  As Directed... 9)  Tramadol Hcl 50 Mg Tabs (Tramadol Hcl) .... Take 1 Tab By Mouth Three Times A Day As Directed... 10)  Tylenol Extra Strength 500 Mg Tabs (Acetaminophen) .... Per Bottle  Allergies: 1)  ! Lipitor (Atorvastatin Calcium) 2)  ! Vicodin 3)  ! Morphine  Past History:  Past Medical History: COPD (ICD-496) CIGARETTE SMOKER (ICD-305.1) MITRAL VALVE PROLAPSE (ICD-424.0) Hx of CHEST PAIN (ICD-786.50) PALPITATIONS, HX OF (ICD-V12.50) CEREBROVASCULAR DISEASE (ICD-437.9) - known 50% stenosis in right PCA on angiogram 2/11 ANEURYSM (ICD-442.9) - s/p stent assisted endovascular obliteration of basilar tip aneurysm 11/10 by DrDeveshwar VENOUS INSUFFICIENCY (ICD-459.81) HYPERLIPIDEMIA (ICD-272.4) HYPOTHYROIDISM (ICD-244.9) IRRITABLE BOWEL SYNDROME (ICD-564.1) NEPHROLITHIASIS (ICD-592.0) Hx of TENDINITIS, RIGHT ELBOW (ICD-727.09) FIBROMYALGIA (ICD-729.1) OSTEOPENIA (ICD-733.90) VITAMIN D DEFICIENCY (ICD-268.9) Hx of ACOUSTIC NEUROMA (ICD-225.1) - s/p translabyrinthine surg 2002 at [facility] DrBrowne   s/p lacunar infarct in right cerebellar hemisphere noted on MRI  2/11 HEADACHE (ICD-784.0) ANXIETY (ICD-300.00) INSOMNIA (ICD-780.52)  Past Surgical History: S/P neurosurgery for acoustic neuroma -2002 at [facility] S/P hysterectomy  S/P breast lumpectomy S/P endovascular coiling of basilar tip aneurysm 11/10 by DrDeveshwar  Family History: Reviewed history from 04/04/2010 and no changes required.  Her mother died at age 24.  She died  from an MI, but   she had also had a CVA.  Her father died at age 53 from cancer.  The  patient is not aware of any family history of cerebral aneurysms  Social History: Reviewed history from 04/20/2009 and no changes required. Married Children + smoker- 1/4 ppd at present social alcohol employ- alterations work Packs/Day:  <0.25  Review of Systems      See HPI       The patient complains of decreased hearing, dyspnea on exertion, muscle weakness, and difficulty walking.  The patient denies anorexia, fever, weight loss, weight gain, vision loss, hoarseness, chest pain, syncope, peripheral edema, prolonged cough, headaches, hemoptysis, abdominal pain, melena, hematochezia, severe indigestion/heartburn, hematuria, incontinence, suspicious skin lesions, transient blindness, depression, unusual weight change, abnormal bleeding, enlarged lymph nodes, and angioedema.    Vital Signs:  Patient profile:   70 year old female Height:      62 inches Weight:      131.50 pounds BMI:     24.14 O2 Sat:      97 % on Room air Temp:     98.8 degrees F oral Pulse rate:   63 / minute BP sitting:   100 / 60  (right arm) Cuff size:   regular  Vitals Entered By: Kandice Hams CMA (May 23, 2010 10:11 AM)  O2 Flow:  Room air CC: 6 week ROV & review... Comments smokes 5-6 cigs per days pharmacy verfied   Physical Exam  Additional Exam:  WD, WN, 70 y/o WF in NAD... she is delighted w/ remarkable improvement on the Pred Rx. GENERAL:  Alert & oriented; pleasant & cooperative... HEENT:  [place]/AT, EOM-wnl, PERRLA, EACs-clear, TMs-  some scarring on right, NOSE-clear, THROAT-clear & wnl. NECK:  Supple w/ fairROM; no JVD; normal carotid impulses w/o bruits; palp thyroid, no nodules felt; no lymphadenopathy. CHEST:  Clear to P & A; without wheezes/ rales/ or rhonchi heard... HEART:  Regular Rhythm; gr1/6 SEM,  no rubs or gallops heard... ABDOMEN:  Soft & nontender; normal bowel sounds; no organomegaly or masses detected. EXT: without deformities, mild arthritic changes; no varicose veins/ venous insuffic/ or edema... +trigger points about the neck/ shoulders w/ some decr ROM shoulders... NEURO:  CN's intact; no focal neuro deficits... gait & station are normal. DERM:  No lesions noted; no rash etc...    Impression & Recommendations:  Problem # 1:  COPD (ICD-496) She must quit all smoking!!! Discussed again w/ pt, offered Chantix, Wellbutrin, Nicotine relacement rx, etc... she is not interested in smoking cessation help or counselling...  Problem # 2:  CEREBROVASCULAR DISEASE (ICD-437.9) As noted>  followed by Neurosurg & IR at [facility] now...  Problem # 3:  HYPERLIPIDEMIA (ICD-272.4) Stable on Prav40>  continue same. Her updated medication list for this problem includes:    Pravastatin Sodium 40 Mg Tabs (Pravastatin sodium) .Marland Kitchen... Take 1 tablet by mouth once a day  Problem # 4:  HYPOTHYROIDISM (ICD-244.9) Stable on Levo100>  continue Rx. Her updated medication list for this problem includes:    Levoxyl 100 Mcg Tabs (Levothyroxine sodium) .Marland Kitchen... Take 1/2   tablet by mouth once a day  Problem # 5:  FIBROMYALGIA (ICD-729.1) She has seen DrTruslow & followed by him now for her Rheum symptoms... Her updated medication list for this problem includes:    Bayer Aspirin 325 Mg Tabs (Aspirin) .Marland Kitchen... Take 1 tablet by mouth once a day    Tramadol Hcl 50 Mg Tabs (Tramadol hcl) .Marland Kitchen... Take 1 tab  by mouth three times a day as directed...    Tylenol Extra Strength 500 Mg Tabs (Acetaminophen) .Marland Kitchen... Per bottle  Problem # 6:  OTHER  MEDICAL PROBLEMS AS NOTED>>>  Complete Medication List: 1)  Bayer Aspirin 325 Mg Tabs (Aspirin) .... Take 1 tablet by mouth once a day 2)  Plavix 75 Mg Tabs (Clopidogrel bisulfate) .... Take 1 tablet by mouth once a day 3)  Pravastatin Sodium 40 Mg Tabs (Pravastatin sodium) .... Take 1 tablet by mouth once a day 4)  Levoxyl 100 Mcg Tabs (Levothyroxine sodium) .... Take 1/2   tablet by mouth once a day 5)  Stool Softener Laxative 8.6-50 Mg Tabs (Sennosides-docusate sodium) .... 2 tabs by mouth at bedtime 6)  Caltrate 600+d 600-400 Mg-unit Tabs (Calcium carbonate-vitamin d) .... Take 1 tab by mouth two times a day... 7)  Vitamin D 1000 Unit Tabs (Cholecalciferol) .... Take 1 cap by mouth once daily.Marland KitchenMarland Kitchen 8)  Clorazepate Dipotassium 7.5 Mg Tabs (Clorazepate dipotassium) .Marland Kitchen.. 1 tab by mouth three times a day as needed  as directed... 9)  Tramadol Hcl 50 Mg Tabs (Tramadol hcl) .... Take 1 tab by mouth three times a day as directed... 10)  Tylenol Extra Strength 500 Mg Tabs (Acetaminophen) .... Per bottle  Patient Instructions: 1)  Today we updated your med list- see below.... 2)  Use the Tramadol & Tylenol as we discussed for your pain.Marland KitchenMarland Kitchen 3)  Continue your regular follow up w/ the Rheumatologist (esp since you stopped the Pred)... 4)  Call for any questions.Marland KitchenMarland Kitchen 5)  Please schedule a follow-up appointment in 4-6 months.

## 2010-07-20 NOTE — Progress Notes (Signed)
Summary: questions regarding medications  Phone Note Call from Patient   Caller: Patient Call For: nadel Summary of Call: patient phoned stated that Dr Kriste Basque has her on Levoxyl 100 MCG and she gets her meds from [facility] through the [facility] and they sent her Lovothyroxine 0.1 MG she wants to verify that this is the same medication before she takes it. she can be reached at [phone] Initial call taken by: Vedia Coffer,  July 14, 2010 12:07 PM  Follow-up for Phone Call        Spoke with pt and verified the msg and advised that these meds are the same.  Follow-up by: Vernie Murders,  July 14, 2010 2:14 PM

## 2010-07-25 ENCOUNTER — Ambulatory Visit: Payer: Self-pay | Admitting: Pulmonary Disease

## 2010-07-28 ENCOUNTER — Encounter: Payer: Self-pay | Admitting: Pulmonary Disease

## 2010-08-09 ENCOUNTER — Encounter: Payer: Self-pay | Admitting: Pulmonary Disease

## 2010-08-09 NOTE — Progress Notes (Signed)
Summary: [facility]: Office Visit  [facility]: Office Visit   Imported By: Earl Many 08/05/2010 09:49:10  _____________________________________________________________________  External Attachment:    Type:   Image     Comment:   External Document

## 2010-08-15 ENCOUNTER — Telehealth (INDEPENDENT_AMBULATORY_CARE_PROVIDER_SITE_OTHER): Payer: Self-pay | Admitting: *Deleted

## 2010-08-16 ENCOUNTER — Other Ambulatory Visit: Payer: Self-pay | Admitting: Orthopedic Surgery

## 2010-08-16 DIAGNOSIS — M25511 Pain in right shoulder: Secondary | ICD-10-CM

## 2010-08-18 NOTE — Letter (Signed)
Summary: Aundra Dubin MD  Aundra Dubin MD   Imported By: Lester Iron Horse 08/09/2010 08:35:56  _____________________________________________________________________  External Attachment:    Type:   Image     Comment:   External Document

## 2010-08-23 NOTE — Progress Notes (Signed)
Summary: constipation and bleeding  Phone Note Call from Patient Call back at Home Phone [phone]   Caller: Patient Reason for Call: Talk to Nurse Summary of Call: Patient calling stating that she is constpated x 2 days.  She has been taking stool softeners with no help.  Requesting to speak to nurses. Initial call taken by: Lehman Prom,  August 15, 2010 12:04 PM  Follow-up for Phone Call        Patient calling back stating she is now bleeding "down there" patient is on plavix and blood thinners.  Lehman Prom  August 15, 2010 1:54 PM  lmomtcb x1 Carver Fila  August 15, 2010 4:43 PM   ATC pt x 3 but line was busy. Will try back later [facility]  August 16, 2010 4:49 PM   Additional Follow-up for Phone Call Additional follow up Details #1::        [facility] x 1 Crystal Jones RN  August 17, 2010 8:45 AM  Called, spoke with pt's husband.  He will inform pt we are returning her calland have her office back Gweneth Dimitri RN  August 18, 2010 12:06 PM  Called and spoke with pt and she states she went to another physician to get relief because she waited over 2 1/2 hrs and never received a phone call back after calling twice before she ever got a call back on monday. pt states she has been seeing Dr. Kriste Basque over 30 years and has never had this problem with getting a call back when she calls for a problem. i apologized to the pt that we could not reach her but we did try to call her and we left vm for her to call back and she never returned our call. Pt states she did not return our call because she went to another physcian for help. Pt also stated "what was the point of returning your call when we never call back in a timely manor".  Pt states she will talk to Dr. Kriste Basque when she comes in for her appointment about this.  Carver Fila  August 19, 2010 11:26 AM

## 2010-08-23 NOTE — Letter (Signed)
Summary: [facility]   Imported By: Sherian Rein 08/16/2010 16:04:28  _____________________________________________________________________  External Attachment:    Type:   Image     Comment:   External Document

## 2010-08-25 ENCOUNTER — Encounter: Payer: Self-pay | Admitting: Pulmonary Disease

## 2010-08-25 ENCOUNTER — Ambulatory Visit (INDEPENDENT_AMBULATORY_CARE_PROVIDER_SITE_OTHER): Payer: Medicare Other | Admitting: Pulmonary Disease

## 2010-08-25 DIAGNOSIS — K589 Irritable bowel syndrome without diarrhea: Secondary | ICD-10-CM

## 2010-08-25 DIAGNOSIS — E785 Hyperlipidemia, unspecified: Secondary | ICD-10-CM

## 2010-08-25 DIAGNOSIS — J449 Chronic obstructive pulmonary disease, unspecified: Secondary | ICD-10-CM

## 2010-08-25 DIAGNOSIS — I729 Aneurysm of unspecified site: Secondary | ICD-10-CM

## 2010-08-25 DIAGNOSIS — IMO0001 Reserved for inherently not codable concepts without codable children: Secondary | ICD-10-CM

## 2010-08-25 DIAGNOSIS — I679 Cerebrovascular disease, unspecified: Secondary | ICD-10-CM

## 2010-08-25 DIAGNOSIS — F17200 Nicotine dependence, unspecified, uncomplicated: Secondary | ICD-10-CM

## 2010-08-25 DIAGNOSIS — E039 Hypothyroidism, unspecified: Secondary | ICD-10-CM

## 2010-08-30 NOTE — Assessment & Plan Note (Signed)
Summary: per pt check out/cb   Primary Care Provider:  Dr. Kriste Basque   CC:  3 month ROV & review of mult medical problems....  History of Present Illness: 70 y/o WF here for a follow up visit... she has multiple medical problems as reviewed below...     ~  November 19, 2009:  she developed some gait instability 2/11 & was rechecked by IR- DrDeveshwar> MRI showed right cbll lacunar infarct, & post op changes in right cerebellopontine angle, plus interval endovasc treatment of basilar tip aneurysm;  f/u arteriogram showed angiographic obliteration of the prev basilar tip aneurysm- distal basilar art stent patent, 50% stenosis right PCA... rec to restart PLAVIX + keep ASA 325mg /d, and quit smoking!!!  Her CC is aching & sore, low energy, etc- c/w her FM;  still smoking 1/4 ppd and can't/ won't quit despite everything.   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 she notes pain started soon after her second arteriogram from DrDeveshwar 2/11 (to eval the stent & coils placed in basilar tip aneurysm)...  tried Mobic, Tramadol, Tylenol- all w/o relief...  she saw DrReynolds 9/11- Neurology at [facility]- for f/u of the brain stem strokes seen on MRI, but he felt symptoms were more rheum related (they checked XRays/ CRP/ CPK- all norm) & he rec PT/ balance training/ vestib rehab but the pt declined "I'm getting my balance under control on my own & don't need therapy"...   She saw DrMorris [facility]- interventional neuroradiology- w/ another arteriogram performed 8/11 & showed satis appearance of the basilar aneurysm stent & coiling w/ some prolapse of the coils into the P1 segm w/ mild compromise of the post cerebral art (flow looks adeq & he rec continued ASA/ Plavix, quit smoking, & follow up 43yr)...   She saw DrBrowne [facility] 7/11- for f/u of her acoustic neuroma w/ f/u scan looking good- no  recurrence but she's had mild infarction in right cerebellar hemisphere after her basilar aneurysm treatment as above...   ~  April 04, 2010:  Labs last week showed norm CBC, norm BMet, norm TFTs, Sed=21, neg RA, & weakly pos ANA= 1:40 speckled... we gave her a Sterapred Dosepak (10mg  6d pack) w/ dramatic improvement in her diffuse aches/ pains (arms/ shoulders much improved)... she states that her husb had to help her get dressed x13mo due to the pain & after 2d Pred (60-50) she was able to dress herself... she has some resid/ persistant upper arm/ shoulder discomfort & restriction & we decided to continue Pred at 5mg /d for now & refer to Rheum to help sort this out (ie- doesn't sound like PMR due to norm sed)... needs yearly f/u CXR- still smokes- stable, NAD.Marland Kitchen.   ~  May 23, 2010:  She saw DrTruslow for rheum eval & he stopped her Pred, otherw no ch in meds, gave her a shot in shoulder & plans continued follow up (she is encouraged to call him for symptoms for his input & on-going care)... despite everything she continues to smoke 5-6 cig/d... mult somatic complaints on & off> using Tramadol, Tylenol...  OK 2011 Flu vaccine today.   ~  August 25, 2010:      Her CC has been her right shoulder pain & several shots from DrTruslow gave only temp relief;  she was seen by Ortho DrNorris who susp[ects a  rotator cuff tear & wanted to image the shoulder (2 questions: can she get MRI, and if not can she come off Plavix to do CT arthrogram?> she is reluctant to stop the Plavix, but I checked w/ IR at [facility] & her stent/ coils are MR compatible, and she had MRI Brain 2/11 after coiling done 11/10);  I will contact Dr Ranell Patrick regarding the MRI of her right shoulder...    Otherw she is feeling well, unfortunately still smoking 5-6 cig/d & can't vs won't quit;  no change in breathing, no CP, palpit, or cerebral ischemic symptoms;  CXR & Labs from 10/11 reviewed w/ pt;  her prev aching/ soreness/ PMR-like symptoms  resolved & have not returned...    Current Problems:   COPD (ICD-496) - she has a min smoker's cough, sm amt beige sputum...  ~  CXR 5/08 w/ chr changes, LLL scar, NAD.  ~  CXR 7/09 showed norm hrt size, clear lungs, NAD.  ~  CXR 9/10 showed NAD.  ~  CXR 10/11 showed chr changes, NAD...  CIGARETTE SMOKER (ICD-305.1) - can't vs won't quit and not interested in smoking cessation help... now she states she's decr to  ~1/4 ppd but can't seem to improve from there...  MITRAL VALVE PROLAPSE (ICD-424.0) & Hx of CHEST PAIN (ICD-786.50) - she had CP in the past and a neg cath 1995...   ~  CP eval 1995 w/ cath showing min 10% luminal irreg RCA & mild MVP, norm LVF...  ~  Cardiolite 1999 was neg- no ischemia, no infarct, EF=66%...  ~  baseline EKG showed NSR, NSSTTWA (poor R prog V1-V3)...  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 referred to [facility] w/ Angiogram confirmation & subseq stent assisted coiling of the aneurysm 11/10... subseq f/u angiogram 2/11 showed complete angiographic obliteration of the aneurysm, & patent stent in distal basilar art (there was a new 50%stenosis in the right PCA)... on ASA 325mg /d & PLAVIX restarted 2/11 w/ new gait abn & cbll infarct on MRI.  ~   she saw DrMorris [facility]- interventional neuroradiology- w/ another arteriogram performed 8/11- it showed satis appearance of the basilar aneurysm stent & coiling w/ some prolapse of the coils into the P1 segm w/ mild compromise of the post cerebral art (flow looks adeq & he rec continued ASA/ Plavix therapy & f/u 46yr).  VENOUS INSUFFICIENCY (ICD-459.81) - she tells me that she saw DrKrush for vein surgery and treatment ("it's covered by my husb's insurance").  HYPERLIPIDEMIA (ICD-272.4) - now on PRAVASTATIN 40mg /d & tol well... we reviewed low chol/ low fat diet.  ~  chart review shows TChol  ~ 200 range in the 80's and early 90's.  ~  then  TChol incr to  ~250 range in late 90's and Lipitor started-   ~  FLP 5/08 on Lipitor 10mg /d showed TChol 164, TG 153, HDL 45, LDL 89.  ~  in 2009 c/o no energy & arm discomfort she felt was from the Lip10- therefore DC'd.  ~  FLP 7/09 on diet alone showed TChol 234, TG 218, HDL 40, LDL 139... rec- start Prav40.  ~  FLP 3/10 on Prav40 showed TChol 182, Tg 137, HDL 47, LDL 107  ~  FLP 6/11 showed TChol 162, TG 230, HDL 37, LDL 88  HYPOTHYROIDISM (ICD-244.9) - stable on LEVOTHYROID 132mcg/d... hx Hashimoto's thyroiditis in 1989 w/ markedly pos antimicrosomal antibodies and transient hyperthy labs...   ~  labs 5/08 showed TSH= 0.53... rec- continue Levo100/d.  ~  labs 7/09 showed TSH= 0.07... rec- decr Levothy100 to 1/2 daily.  ~  labs 3/10 showed TSH= 17.71... rec> incr back to 113mcg/d.  ~  labs 9/10 showed TSH= 0.21... continue same.  ~  labs 6/11 showed TSH= 0.10... rec decr Levo100 to 1/2 daily.  ~  labs 10/11 on Levo50 showed TSH= 1.25 (FreeT3 & FreeT4 normal)  IRRITABLE BOWEL SYNDROME (ICD-564.1) - had colonoscopy by [facility] 11/06 that was WNL.Marland Kitchen. she notes some constipation & Rx w/ colase OTC...  NEPHROLITHIASIS (ICD-592.0) - last stone passed in 1993, no known recurrence since then...  Hx of TENDINITIS, RIGHT ELBOW (ICD-727.09) FIBROMYALGIA (ICD-729.1) - current symptoms may reflect a flair in her prev FM symptoms> but much improved on Pred Rx.  ~  labs 9/10 showed Sed= 24  ~  labs 6/11 showed Sed= 30  ~  labs 10/11 showed CBC=norm, Chems=norm, TFTs=norm, Sed=21, RF=neg, ANA+1:40 speckled...  ~  12/11:  pt saw DrTruslow & his note is pending- she indicates her stopped her Pred, & gave her shot in shoulder.  OSTEOPENIA (ICD-733.90) - BMD here 7/09 showed TScores -0.8 in Spine, and -1.2 in right FemNeck... rec to take Calcium, MVI, Vit D...  VITAMIN D DEFICIENCY (ICD-268.9)  ~  labs 7/09 showed Vit D level = 16... rec- start Vit D 50K weekly.  ~  labs 3/10 showed Vit D level =  69... rec> change to 1000 u daily.  ~  labs 6/11 showed Vit D level = 55... continue same.  Hx of ACOUSTIC NEUROMA (ICD-225.1) - s/p surg for this tumor 10/02 at [facility]... no known recurrence...  ~  MRI Brain 2/07 showed s/p translabyrinthine approach to right acoustic neuroma w/o recurrence, NAD...  ~  MRI Brain 10/10 showed no recurrence of tumor, but 4-54mm basilar tip aneurysm detected & Rx as above.  ~  MRI Brain 2/11 showed no recurrence/ post op changes, endovasc oblit of basilar aneurysm, cerebellar lacunar infarct & 50% stenosis of right PCA... PLAVIX restarted... she had review by [facility] IR & Neurology (as above)...  ANXIETY (ICD-300.00) - uses CHLORAZEPATE 7.5mg Tid... she gets claustrophobic and needs sedation before MRIs.   Preventive Screening-Counseling & Management  Alcohol-Tobacco     Smoking Status: current     Packs/Day: <0.25     Year Started: 1960's  Allergies: 1)  ! Lipitor (Atorvastatin Calcium) 2)  ! Vicodin 3)  ! Morphine  Comments:  Nurse/Medical Assistant: The patient's medications and allergies were reviewed with the patient and were updated in the Medication and Allergy Lists.  Past History:  Past Medical History: COPD (ICD-496) CIGARETTE SMOKER (ICD-305.1) MITRAL VALVE PROLAPSE (ICD-424.0) Hx of CHEST PAIN (ICD-786.50) PALPITATIONS, HX OF (ICD-V12.50) CEREBROVASCULAR DISEASE (ICD-437.9) - known 50% stenosis in right PCA on angiogram 2/11 ANEURYSM (ICD-442.9) - s/p stent assisted endovascular obliteration of basilar tip aneurysm 11/10 by DrDeveshwar VENOUS INSUFFICIENCY (ICD-459.81) HYPERLIPIDEMIA (ICD-272.4) HYPOTHYROIDISM (ICD-244.9) IRRITABLE BOWEL SYNDROME (ICD-564.1) NEPHROLITHIASIS (ICD-592.0) Hx of TENDINITIS, RIGHT ELBOW (ICD-727.09) FIBROMYALGIA (ICD-729.1) OSTEOPENIA (ICD-733.90) VITAMIN D DEFICIENCY (ICD-268.9) Hx of ACOUSTIC NEUROMA (ICD-225.1) - s/p translabyrinthine surg 2002 at [facility] DrBrowne   s/p lacunar infarct in right  cerebellar hemisphere noted on MRI 2/11 HEADACHE (ICD-784.0) ANXIETY (ICD-300.00) INSOMNIA (ICD-780.5)  Past Surgical History: S/P neurosurgery for acoustic neuroma -2002 at [facility] S/P hysterectomy  S/P breast lumpectomy S/P endovascular coiling of basilar tip aneurysm 11/10 by DrDeveshwar  Family History: Reviewed history from 04/04/2010 and no changes required.  Her  mother died at age 61.  She died from an MI, but   she had also had a CVA.  Her father died at age 73 from cancer.  The  patient is not aware of any family history of cerebral aneurysms  Social History: Reviewed history from 04/20/2009 and no changes required. Married Children + smoker- 1/4 ppd at present social alcohol employ- alterations work  Review of Systems      See HPI       The patient complains of dyspnea on exertion.  The patient denies anorexia, fever, weight loss, weight gain, vision loss, decreased hearing, hoarseness, chest pain, syncope, peripheral edema, prolonged cough, headaches, hemoptysis, abdominal pain, melena, hematochezia, severe indigestion/heartburn, hematuria, incontinence, muscle weakness, suspicious skin lesions, transient blindness, difficulty walking, depression, unusual weight change, abnormal bleeding, enlarged lymph nodes, and angioedema.    Vital Signs:  Patient profile:   70 year old female Height:      62 inches Weight:      134.38 pounds BMI:     24.67 O2 Sat:      98 % on Room air Temp:     99.0 degrees F oral Pulse rate:   71 / minute BP sitting:   114 / 60  (left arm) Cuff size:   regular  Vitals Entered By: Randell Loop CMA (August 25, 2010 11:53 AM)  O2 Sat at Rest %:  98 O2 Flow:  Room air CC: 3 month ROV & review of mult medical problems... Is Patient Diabetic? No Pain Assessment Patient in pain? yes      Onset of pain  right shoulder pain all the time Comments no changes in meds today   Physical Exam  Additional Exam:  WD, WN, 70 y/o WF in  NAD... GENERAL:  Alert & oriented; pleasant & cooperative... HEENT:  [place]/AT, EOM-wnl, PERRLA, EACs-clear, TMs- some scarring on right, NOSE-clear, THROAT-clear & wnl. NECK:  Supple w/ fairROM; no JVD; normal carotid impulses w/o bruits; palp thyroid, no nodules felt; no lymphadenopathy. CHEST:  Clear to P & A; without wheezes/ rales/ or rhonchi heard... HEART:  Regular Rhythm; gr1/6 SEM,  no rubs or gallops heard... ABDOMEN:  Soft & nontender; normal bowel sounds; no organomegaly or masses detected. EXT: without deformities, mild arthritic changes; no varicose veins/ venous insuffic/ or edema... +trigger points about the neck/ shoulders w/ decr ROM right shoulder w/ pain... NEURO:  CN's intact; no focal neuro deficits... gait & station are normal. DERM:  No lesions noted; no rash etc...    Impression & Recommendations:  Problem # 1:  Right Shoulder Pain >>> Per DrNorris & Truslow> 3 shots w/o relief & DrNorris is concerned for rotator cuff tear... OK to proceed w/ MRI to image the right shoulder & I will contact DrNorris...  Problem # 2:  COPD (ICD-496) Mod disease> must quit all smoking but she isn't motivated or interested in smoking cessation help...  Problem # 3:  CEREBROVASCULAR DISEASE (ICD-437.9) See involved hx & discussion above>  s/p basilar tip aneurysm stent & coiling w/ complic... she continues stable on ASA/ Plavix...  Problem # 4:  HYPERLIPIDEMIA (ICD-272.4) Stable on Prav40>  we will recheck FLP on ret... Her updated medication list for this problem includes:    Pravastatin Sodium 40 Mg Tabs (Pravastatin sodium) .Marland Kitchen... Take 1 tablet by mouth once a day  Problem # 5:  HYPOTHYROIDISM (ICD-244.9) Stable on Synth100>  keep same & we will monitor TSH etc... Her updated medication list for this problem  includes:    Levoxyl 100 Mcg Tabs (Levothyroxine sodium) .Marland Kitchen... Take 1/2   tablet by mouth once a day  Problem # 6:  Hx of ACOUSTIC NEUROMA (ICD-225.1) Followed by drBrowne  [facility] & doing well w/o signs recurrence...  Problem # 7:  OTHER MEDICAL PROBLEMS AS NOTED>>>  Complete Medication List: 1)  Bayer Aspirin 325 Mg Tabs (Aspirin) .... Take 1 tablet by mouth once a day 2)  Plavix 75 Mg Tabs (Clopidogrel bisulfate) .... Take 1 tablet by mouth once a day 3)  Pravastatin Sodium 40 Mg Tabs (Pravastatin sodium) .... Take 1 tablet by mouth once a day 4)  Levoxyl 100 Mcg Tabs (Levothyroxine sodium) .... Take 1/2   tablet by mouth once a day 5)  Stool Softener Laxative 8.6-50 Mg Tabs (Sennosides-docusate sodium) .... 2 tabs by mouth at bedtime 6)  Caltrate 600+d 600-400 Mg-unit Tabs (Calcium carbonate-vitamin d) .... Take 1 tab by mouth two times a day... 7)  Vitamin D 1000 Unit Tabs (Cholecalciferol) .... Take 1 cap by mouth once daily.Marland KitchenMarland Kitchen 8)  Clorazepate Dipotassium 7.5 Mg Tabs (Clorazepate dipotassium) .Marland Kitchen.. 1 tab by mouth three times a day as needed  as directed... 9)  Tramadol Hcl 50 Mg Tabs (Tramadol hcl) .... Take 1 tab by mouth three times a day as directed... 10)  Tylenol Extra Strength 500 Mg Tabs (Acetaminophen) .... Per bottle  Patient Instructions: 1)  Today we updated your med list- see below.... 2)  Continue your current meds the same... 3)  I will inquire about finding a way around the Plavix dilemma & I will call you when I have figured out our options.Marland KitchenMarland Kitchen 4)  Let me know if you have any problems.Marland KitchenMarland Kitchen 5)  Please schedule a follow-up appointment in 3-4 months.   Immunization History:  Influenza Immunization History:    Influenza:  historical (03/29/2010)

## 2010-08-31 LAB — BASIC METABOLIC PANEL
Calcium: 9.2 mg/dL (ref 8.4–10.5)
Creatinine, Ser: 0.52 mg/dL (ref 0.4–1.2)
GFR calc Af Amer: >60 mL/min (ref >60)
GFR calc non Af Amer: >60 mL/min (ref >60)
Sodium: 139 mEq/L (ref 135–145)

## 2010-08-31 LAB — CBC
Hemoglobin: 12.6 g/dL (ref 12.0–15.0)
RBC: 4.11 MIL/uL (ref 3.87–5.11)
WBC: 12.9 10*3/uL — ABNORMAL HIGH (ref 4.0–10.5)

## 2010-08-31 LAB — PROTIME-INR
INR: 0.92 (ref 0.00–1.49)
Prothrombin Time: 12.3 seconds (ref 11.6–15.2)

## 2010-08-31 LAB — APTT: aPTT: 30 seconds (ref 24–37)

## 2010-09-14 LAB — CBC
HCT: 31.8 % — ABNORMAL LOW (ref 36.0–46.0)
HCT: 41.4 % (ref 36.0–46.0)
Hemoglobin: 10.8 g/dL — ABNORMAL LOW (ref 12.0–15.0)
Hemoglobin: 10.9 g/dL — ABNORMAL LOW (ref 12.0–15.0)
MCV: 90.3 fL (ref 78.0–100.0)
MCV: 90.8 fL (ref 78.0–100.0)
Platelets: 234 10*3/uL (ref 150–400)
Platelets: 283 10*3/uL (ref 150–400)
RBC: 3.46 MIL/uL — ABNORMAL LOW (ref 3.87–5.11)
RBC: 3.52 MIL/uL — ABNORMAL LOW (ref 3.87–5.11)
RBC: 4.56 MIL/uL (ref 3.87–5.11)
RDW: 12.5 % (ref 11.5–15.5)
RDW: 12.9 % (ref 11.5–15.5)
WBC: 13 10*3/uL — ABNORMAL HIGH (ref 4.0–10.5)
WBC: 16.1 10*3/uL — ABNORMAL HIGH (ref 4.0–10.5)
WBC: 11.2 10*3/uL — ABNORMAL HIGH (ref 4.0–10.5)
WBC: 11.6 10*3/uL — ABNORMAL HIGH (ref 4.0–10.5)

## 2010-09-14 LAB — BASIC METABOLIC PANEL
BUN: 5 mg/dL — ABNORMAL LOW (ref 6–23)
BUN: 12 mg/dL (ref 6–23)
CO2: 22 mEq/L (ref 19–32)
Calcium: 8.4 mg/dL (ref 8.4–10.5)
Chloride: 114 mEq/L — ABNORMAL HIGH (ref 96–112)
Chloride: 108 mEq/L (ref 96–112)
GFR calc non Af Amer: >60 mL/min (ref >60)
GFR calc non Af Amer: >60 mL/min (ref >60)
Glucose, Bld: 101 mg/dL — ABNORMAL HIGH (ref 70–99)
Glucose, Bld: 96 mg/dL (ref 70–99)
Potassium: 4.2 mEq/L (ref 3.5–5.1)
Potassium: 3.8 mEq/L (ref 3.5–5.1)
Sodium: 140 mEq/L (ref 135–145)

## 2010-09-14 LAB — DIFFERENTIAL
Basophils Absolute: 0.1 10*3/uL (ref 0.0–0.1)
Eosinophils Absolute: 0 10*3/uL (ref 0.0–0.7)
Eosinophils Absolute: 0.3 10*3/uL (ref 0.0–0.7)
Eosinophils Relative: 0 % (ref 0–5)
Eosinophils Relative: 3 % (ref 0–5)
Lymphocytes Relative: 14 % (ref 12–46)
Lymphs Abs: 2.5 10*3/uL (ref 0.7–4.0)
Lymphs Abs: 3.2 10*3/uL (ref 0.7–4.0)
Monocytes Relative: 4 % (ref 3–12)
Monocytes Relative: 5 % (ref 3–12)
Neutro Abs: 12.9 10*3/uL — ABNORMAL HIGH (ref 1.7–7.7)
Neutrophils Relative %: 80 % — ABNORMAL HIGH (ref 43–77)

## 2010-09-14 LAB — APTT: aPTT: 31 seconds (ref 24–37)

## 2010-09-15 LAB — CBC
HCT: 39.8 % (ref 36.0–46.0)
MCHC: 34.3 g/dL (ref 30.0–36.0)
MCV: 90.7 fL (ref 78.0–100.0)
Platelets: 284 10*3/uL (ref 150–400)
RBC: 4.39 MIL/uL (ref 3.87–5.11)

## 2010-09-15 LAB — PROTIME-INR: Prothrombin Time: 11.9 seconds (ref 11.6–15.2)

## 2010-09-15 LAB — BASIC METABOLIC PANEL
BUN: 11 mg/dL (ref 6–23)
CO2: 27 mEq/L (ref 19–32)
Chloride: 108 mEq/L (ref 96–112)
Creatinine, Ser: 0.63 mg/dL (ref 0.4–1.2)

## 2010-10-12 ENCOUNTER — Other Ambulatory Visit: Payer: Self-pay | Admitting: Pulmonary Disease

## 2010-10-12 MED ORDER — TRAMADOL HCL 50 MG PO TABS: 50.0000 mg | ORAL_TABLET | Freq: Three times a day (TID) | ORAL | Status: DC | PRN

## 2010-10-12 NOTE — Telephone Encounter (Signed)
Please advise if okay to send refill for Tramadol, 90 day supply. Pt last seen on 08/25/2010 by SN.

## 2010-10-12 NOTE — Telephone Encounter (Signed)
Pt aware RX has been sent to [facility].

## 2010-10-12 NOTE — Telephone Encounter (Signed)
Med has been sent to the pharmacy for the pt for [facility] day supply [facility]

## 2010-10-28 NOTE — Assessment & Plan Note (Signed)
[facility]                               PULMONARY OFFICE NOTE   NAME:Ana Santos, Ana Santos                      MRN:          [id number]  DATE:04/11/2006                            DOB:          07/19/1940    HISTORY OF PRESENT ILLNESS:  Patient is a 70 year old white female patient  of Dr. Jodelle Green who has a known history of COPD in a patient who continues to  smoke against medical advice, hyperlipidemia and fibromyalgia.  Patient  presents for 2 day history of left hip and leg pain.  Patient has been using  heat patches without any relief.  She denies any known injury, chest pain,  shortness of breath, back pain or urinary problems.   PAST MEDICAL HISTORY:  Is reviewed.   CURRENT MEDICATIONS:  Is reviewed.   PHYSICAL EXAMINATION:  Patient is a pleasant female in no acute distress.  Temperature 99.3.  Blood pressure 112/80.  O2 saturation is 98% on room air.  HEENT:  Is unremarkable.  NECK:  Supple without adenopathy.  LUNGS:  Sounds are clear to auscultation bilaterally.  CARDIAC:  Is a regular rate and rhythm.  ABDOMEN:  Is soft, no hepatosplenomegaly.  No guarding, no rebounding.  No  abdominal bruits or masses appreciated.  EXTREMITIES:  Warm without any catches, cyanosis, clubbing or edema.  Equal  strength of the lower extremities bilaterally.  Pulses are intact.  Patient  has positive pain and tenderness in the left parietal hip area, especially  point tenderness over the greater trochanter.  Patient with decreased range  of motion of external and internal rotation of the hip.  With significant  reproducible pain.  Patient has some slight bruising over the greater  trochanteric area as well.  Right leg and hip range of motion is normal with  normal strength.   IMPRESSION AND PLAN:  Left hip pain. Underlying left hip bursitis.  The  patient is to begin ibuprofen 800 mg t.i.d. times 7 to 10 days.  Alternate  ice and heat to the area.  Patient  may use Ultram 50 mg every 4 hours as  needed for pain.  Patient's x-ray of the hip and femur are pending at the  time of dictation.  If symptoms do not improve may need referral to  orthopedist.     Rubye Oaks, NP  Electronically Signed      Lonzo Cloud. Kriste Basque, MD  Electronically Signed   TP/MedQ  DD: 04/12/2006  DT: 04/12/2006  Job #: [id number]

## 2010-11-21 ENCOUNTER — Encounter: Payer: Self-pay | Admitting: Pulmonary Disease

## 2010-11-25 ENCOUNTER — Ambulatory Visit: Payer: Medicare Other | Admitting: Pulmonary Disease

## 2010-12-01 ENCOUNTER — Ambulatory Visit (INDEPENDENT_AMBULATORY_CARE_PROVIDER_SITE_OTHER): Payer: Medicare Other | Admitting: Pulmonary Disease

## 2010-12-01 ENCOUNTER — Encounter: Payer: Self-pay | Admitting: Pulmonary Disease

## 2010-12-01 ENCOUNTER — Other Ambulatory Visit (INDEPENDENT_AMBULATORY_CARE_PROVIDER_SITE_OTHER): Payer: Medicare Other

## 2010-12-01 DIAGNOSIS — I679 Cerebrovascular disease, unspecified: Secondary | ICD-10-CM

## 2010-12-01 DIAGNOSIS — IMO0001 Reserved for inherently not codable concepts without codable children: Secondary | ICD-10-CM

## 2010-12-01 DIAGNOSIS — K589 Irritable bowel syndrome without diarrhea: Secondary | ICD-10-CM

## 2010-12-01 DIAGNOSIS — I6350 Cerebral infarction due to unspecified occlusion or stenosis of unspecified cerebral artery: Secondary | ICD-10-CM

## 2010-12-01 DIAGNOSIS — J4489 Other specified chronic obstructive pulmonary disease: Secondary | ICD-10-CM

## 2010-12-01 DIAGNOSIS — E785 Hyperlipidemia, unspecified: Secondary | ICD-10-CM

## 2010-12-01 DIAGNOSIS — D333 Benign neoplasm of cranial nerves: Secondary | ICD-10-CM

## 2010-12-01 DIAGNOSIS — I729 Aneurysm of unspecified site: Secondary | ICD-10-CM

## 2010-12-01 DIAGNOSIS — M899 Disorder of bone, unspecified: Secondary | ICD-10-CM

## 2010-12-01 DIAGNOSIS — J449 Chronic obstructive pulmonary disease, unspecified: Secondary | ICD-10-CM

## 2010-12-01 DIAGNOSIS — F411 Generalized anxiety disorder: Secondary | ICD-10-CM

## 2010-12-01 DIAGNOSIS — E039 Hypothyroidism, unspecified: Secondary | ICD-10-CM

## 2010-12-01 DIAGNOSIS — F17200 Nicotine dependence, unspecified, uncomplicated: Secondary | ICD-10-CM

## 2010-12-01 DIAGNOSIS — M949 Disorder of cartilage, unspecified: Secondary | ICD-10-CM

## 2010-12-01 DIAGNOSIS — E559 Vitamin D deficiency, unspecified: Secondary | ICD-10-CM

## 2010-12-01 LAB — LIPID PANEL
LDL Cholesterol: 105 mg/dL — ABNORMAL HIGH (ref 0–99)
Total CHOL/HDL Ratio: 4

## 2010-12-01 LAB — CBC WITH DIFFERENTIAL/PLATELET
Basophils Absolute: 0 10*3/uL (ref 0.0–0.1)
Basophils Relative: 0.1 % (ref 0.0–3.0)
Eosinophils Absolute: 0.2 10*3/uL (ref 0.0–0.7)
Hemoglobin: 13 g/dL (ref 12.0–15.0)
Lymphocytes Relative: 31.4 % (ref 12.0–46.0)
MCHC: 34 g/dL (ref 30.0–36.0)
MCV: 89.4 fl (ref 78.0–100.0)
Monocytes Absolute: 0.5 10*3/uL (ref 0.1–1.0)
Neutro Abs: 5.6 10*3/uL (ref 1.4–7.7)
RDW: 13.3 % (ref 11.5–14.6)

## 2010-12-01 LAB — BASIC METABOLIC PANEL
CO2: 27 mEq/L (ref 19–32)
Calcium: 9.6 mg/dL (ref 8.4–10.5)
Chloride: 107 mEq/L (ref 96–112)
Sodium: 140 mEq/L (ref 135–145)

## 2010-12-01 LAB — HEPATIC FUNCTION PANEL
ALT: 13 U/L (ref 0–35)
Alkaline Phosphatase: 104 U/L (ref 39–117)
Bilirubin, Direct: 0.1 mg/dL (ref 0.0–0.3)
Total Bilirubin: 0.3 mg/dL (ref 0.3–1.2)
Total Protein: 7.4 g/dL (ref 6.0–8.3)

## 2010-12-01 NOTE — Patient Instructions (Signed)
Today we updated your med list in EPIC>    Continue your current meds the same...  Today we did your follow up fasting blood work...    Please call the [facility]REE in a few days for your results...    Dial [id number] & when prompted enter your patient number followed by the # symbol...    Your patient number is:  [id number]#  Please please please try to cut back further on your smoking!!!  Stay as active as possible...  Let's plan a follow up visit in 6 months, sooner if needed for problems.Marland KitchenMarland Kitchen

## 2010-12-01 NOTE — Progress Notes (Signed)
Subjective:    Patient ID: Ana Santos, female    DOB: 07/11/1940, 70 y.o.   MRN: [id number]  HPI 70 y/o WF here for a follow up visit... she has multiple medical problems as reviewed below...    ~  November 19, 2009:  she developed some gait instability 2/11 & was rechecked by IR- DrDeveshwar> MRI showed right cbll lacunar infarct, & post op changes in right cerebellopontine angle, plus interval endovasc treatment of basilar tip aneurysm;  f/u arteriogram showed angiographic obliteration of the prev basilar tip aneurysm- distal basilar art stent patent, 50% stenosis right PCA... rec to restart PLAVIX + keep ASA 325mg /d, and quit smoking!!!  Her CC is aching & sore, low energy, etc- c/w her FM;  still smoking 1/4 ppd and can't/ won't quit despite everything.  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 she notes pain started soon after her second arteriogram from DrDeveshwar 2/11 (to eval the stent & coils placed in basilar tip aneurysm)...  tried Mobic, Tramadol, Tylenol- all w/o relief...  she saw DrReynolds 9/11- Neurology at [facility]- for f/u of the brain stem strokes seen on MRI, but he felt symptoms were more rheum related (they checked XRays/ CRP/ CPK- all norm) & he rec PT/ balance training/ vestib rehab but the pt declined "I'm getting my balance under control on my own & don't need therapy"...   She saw DrMorris [facility]- interventional neuroradiology- w/ another arteriogram performed 8/11 & showed satis appearance of the basilar aneurysm stent & coiling w/ some prolapse of the coils into the P1 segm w/ mild compromise of the post cerebral art (flow looks adeq & he rec continued ASA/ Plavix, quit smoking, & follow up 69yr)...   She saw DrBrowne [facility] 7/11- for f/u of her acoustic neuroma w/ f/u scan looking good- no recurrence but she's had mild infarction in right  cerebellar hemisphere after her basilar aneurysm treatment as above...  ~  April 04, 2010:  Labs last week showed norm CBC, norm BMet, norm TFTs, Sed=21, neg RA, & weakly pos ANA= 1:40 speckled... we gave her a Sterapred Dosepak (10mg  6d pack) w/ dramatic improvement in her diffuse aches/ pains (arms/ shoulders much improved)... she states that her husb had to help her get dressed x46mo due to the pain & after 2d Pred (60-50) she was able to dress herself... she has some resid/ persistant upper arm/ shoulder discomfort & restriction & we decided to continue Pred at 5mg /d for now & refer to Rheum to help sort this out (ie- doesn't sound like PMR due to norm sed)... needs yearly f/u CXR- still smokes- stable, NAD.Marland Kitchen.  ~  May 23, 2010:  She saw DrTruslow for rheum eval & he stopped her Pred, otherw no ch in meds, gave her a shot in shoulder & plans continued follow up (she is encouraged to call him for symptoms for his input & on-going care)... despite everything she continues to smoke 5-6 cig/d... mult somatic complaints on & off> using Tramadol, Tylenol...  OK 2011 Flu vaccine today.  ~  August 25, 2010:  Her CC has been her right shoulder pain & several shots from DrTruslow gave only temp relief;  she was seen by Ortho DrNorris who suspects a rotator cuff tear & wanted to image the shoulder (2 questions: can she get MRI, and if  not can she come off Plavix to do CT arthrogram?> she is reluctant to stop the Plavix, but I checked w/ IR at [facility] & her stent/ coils are MR compatible, and she had MRI Brain 2/11 after coiling done 11/10);  I called Dr Ranell Patrick regarding the MRI of her right shoulder...    Otherw she is feeling well, unfortunately still smoking 5-6 cig/d & can't vs won't quit;  no change in breathing, no CP, palpit, or cerebral ischemic symptoms;  CXR & Labs from 10/11 reviewed w/ pt;  her prev aching/ soreness/ PMR-like symptoms resolved & have not returned...   ~  December 01, 2010:  43mo ROV & she  is stable> working regularly doing alterations again & she loves it... Note: she did not bring med bottles to review... She states that DrNorris never called her back to let her know if her stent was MRI compatible (we had discovered that her coils were MR compatible & as noted she had an MRI 07/31/09 after the coils & stent were done so they are both clearly compatible); nevertheless she states her right shoulder discomfort improved markedly on it's own & she is feeling much better w/ just an occas Tramadol for pain;  She is waiting to hear from [facility] IR DrMorris regarding f/u visit due 8/12, she is doing well w/o cerebral ischemic symptoms & is tolerating her ASA/ Plavix w/o difficulty, bleeding, etc... Due for fasting blood work today> see below...   Problem List:   COPD (ICD-496) - she has a min smoker's cough, sm amt beige sputum... ~  CXR 5/08 w/ chr changes, LLL scar, NAD. ~  CXR 7/09 showed norm hrt size, clear lungs, NAD. ~  CXR 9/10 showed NAD. ~  CXR 10/11 showed chr changes, NAD...  CIGARETTE SMOKER (ICD-305.1) - can't vs won't quit and not interested in smoking cessation help... now she states she's decr to ~1/4 ppd but can't seem to improve from there...  MITRAL VALVE PROLAPSE (ICD-424.0) & Hx of CHEST PAIN (ICD-786.50) - she had CP in the past and a neg cath 1995...  ~  CP eval 1995 w/ cath showing min 10% luminal irreg RCA & mild MVP, norm LVF... ~  Cardiolite 1999 was neg- no ischemia, no infarct, EF=66%... ~  baseline EKG showed NSR, NSSTTWA (poor R prog V1-V3)...  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 referred to [facility] w/ Angiogram confirmation & subseq stent assisted coiling of the aneurysm 11/10... subseq f/u angiogram 2/11 showed complete angiographic obliteration of the aneurysm, & patent stent in distal basilar art (there was a new 50%stenosis in the right PCA)... on ASA 325mg /d & PLAVIX  restarted 2/11 w/ new gait abn & cbll infarct on MRI. ~   she saw DrMorris [facility]- interventional neuroradiology- w/ another arteriogram performed 8/11- it showed satis appearance of the basilar aneurysm stent & coiling w/ some prolapse of the coils into the P1 segm w/ mild compromise of the post cerebral art (flow looks adeq & he rec continued ASA/ Plavix therapy & f/u 61yr).  VENOUS INSUFFICIENCY (ICD-459.81) - she tells me that she saw DrKrush for vein surgery and treatment ("it's covered by my husb's insurance").  HYPERLIPIDEMIA (ICD-272.4) - now on PRAVASTATIN 40mg /d & tol well... we reviewed low chol/ low fat diet. ~  chart review shows TChol ~ 200 range in the 80's and early 90's. ~  then TChol incr to ~250 range in  late 90's and Lipitor started-  ~  FLP 5/08 on Lipitor 10mg /d showed TChol 164, TG 153, HDL 45, LDL 89. ~  in 2009 c/o no energy & arm discomfort she felt was from the Lip10- therefore DC'd. ~  FLP 7/09 on diet alone showed TChol 234, TG 218, HDL 40, LDL 139... rec- start Prav40. ~  FLP 3/10 on Prav40 showed TChol 182, Tg 137, HDL 47, LDL 107 ~  FLP 6/11 on Prav40 showed TChol 162, TG 230, HDL 37, LDL 88 ~  FLP 6/12 on Prav40 showed TChol 177, TG 139, HDL 44, LDL 105... Contin diet, take med every day.  HYPOTHYROIDISM (ICD-244.9) - stable on LEVOTHYROID 19mcg/d... hx Hashimoto's thyroiditis in 1989 w/ markedly pos antimicrosomal antibodies and transient hyperthy labs...  ~  labs 5/08 showed TSH= 0.53... rec- continue Levo100/d. ~  labs 7/09 showed TSH= 0.07... rec- decr Levothy100 to 1/2 daily. ~  labs 3/10 showed TSH= 17.71... rec> incr back to 1107mcg/d. ~  labs 9/10 showed TSH= 0.21... continue same. ~  labs 6/11 showed TSH= 0.10... rec decr Levo100 to 1/2 daily. ~  labs 10/11 on Levo50 showed TSH= 1.25 (FreeT3 & FreeT4 normal) ~  Labs 6/12 on Levo50 showed TSH= 12.56 ==> we will contact pt & pharm to determine if taking Levo100-1/2tab daily every day? & adjust.  IRRITABLE  BOWEL SYNDROME (ICD-564.1) - had colonoscopy by DrPatterson 11/06 that was WNL.Marland Kitchen. she notes some constipation & Rx w/ colase OTC...  NEPHROLITHIASIS (ICD-592.0) - last stone passed in 1993, no known recurrence since then...  Hx of TENDINITIS, RIGHT ELBOW (ICD-727.09) Hx right shoulder pain w/ eval by DrNorris (MRI was planned butnever done & symptoms improved spont)... FIBROMYALGIA (ICD-729.1) - prev severe symptoms may have reflected a flair in her FM> but much improved on Pred Rx. ~  labs 9/10 showed Sed= 24 ~  labs 6/11 showed Sed= 30 ~  labs 10/11 showed CBC=norm, Chems=norm, TFTs=norm, Sed=21, RF=neg, ANA+1:40 speckled... ~  12/11:  pt saw DrTruslow & his note is pending- she indicates he stopped her Pred, & gave her shot in shoulder.  OSTEOPENIA (ICD-733.90) - BMD here 7/09 showed TScores -0.8 in Spine, and -1.2 in right FemNeck... rec to take Calcium, MVI, Vit D...  VITAMIN D DEFICIENCY (ICD-268.9) ~  labs 7/09 showed Vit D level = 16... rec- start Vit D 50K weekly. ~  labs 3/10 showed Vit D level = 69... rec> change to 1000 u daily. ~  labs 6/11 showed Vit D level = 55... continue same.  Hx of ACOUSTIC NEUROMA (ICD-225.1) - s/p surg for this tumor 10/02 at [facility]... no known recurrence... ~  MRI Brain 2/07 showed s/p translabyrinthine approach to right acoustic neuroma w/o recurrence, NAD... ~  MRI Brain 10/10 showed no recurrence of tumor, but 4-74mm basilar tip aneurysm detected & Rx as above. ~  MRI Brain 2/11 showed no recurrence/ post op changes, endovasc oblit of basilar aneurysm, cerebellar lacunar infarct & 50% stenosis of right PCA... PLAVIX restarted... she had review by [facility] IR & Neurology (as above)...  ANXIETY (ICD-300.00) - uses CHLORAZEPATE 7.5mg Tid... she gets claustrophobic and needs sedation before MRIs.   Past Surgical History  Procedure Date  . Translabyrinthine procedure 2002    [facility] Dr. Erroll Luna  . Total abdominal hysterectomy   . Breast lumpectomy   .  Aneurysm coiling 11-10    Dr. Corliss Skains    Outpatient Encounter Prescriptions as of 12/01/2010  Medication Sig Dispense Refill  . acetaminophen (TYLENOL)  500 MG tablet Take 500 mg by mouth every 6 (six) hours as needed.        Marland Kitchen aspirin 325 MG tablet Take 325 mg by mouth daily.        . Calcium Carbonate-Vitamin D (CALTRATE 600+D) 600-400 MG-UNIT per tablet Take 1 tablet by mouth 2 (two) times daily.        . cholecalciferol (VITAMIN D) 1000 UNITS tablet Take 1,000 Units by mouth daily.        . clopidogrel (PLAVIX) 75 MG tablet Take 75 mg by mouth daily.        . clorazepate (TRANXENE) 7.5 MG tablet Take 7.5 mg by mouth 3 (three) times daily as needed.        Marland Kitchen levothyroxine (SYNTHROID, LEVOTHROID) 100 MCG tablet Take 50 mcg by mouth daily.    ==> taking 1/2 tab daily    . pravastatin (PRAVACHOL) 40 MG tablet Take 40 mg by mouth daily.        Marland Kitchen senna-docusate (STOOL SOFTENER & LAXATIVE) 8.6-50 MG per tablet Take 2 tablets by mouth at bedtime.        . traMADol (ULTRAM) 50 MG tablet Take 1 tablet (50 mg total) by mouth 3 (three) times daily as needed for pain.  90 tablet  3    Allergies  Allergen Reactions  . Atorvastatin     REACTION: INTOL to Lipitor w/ arm pain (3/09)  . Hydrocodone-Acetaminophen     REACTION: hallucinations  . Morphine     REACTION: hallucinations    Review of Systems         See HPI - all other systems neg except as noted... The patient complains of dyspnea on exertion.  The patient denies anorexia, fever, weight loss, weight gain, vision loss, decreased hearing, hoarseness, chest pain, syncope, peripheral edema, prolonged cough, headaches, hemoptysis, abdominal pain, melena, hematochezia, severe indigestion/heartburn, hematuria, incontinence, muscle weakness, suspicious skin lesions, transient blindness, difficulty walking, depression, unusual weight change, abnormal bleeding, enlarged lymph nodes, and angioedema.     Objective:   Physical Exam     WD, WN,  70 y/o WF in NAD... GENERAL:  Alert & oriented; pleasant & cooperative... HEENT:  [place]/AT, EOM-wnl, PERRLA, EACs-clear, TMs- some scarring on right, NOSE-clear, THROAT-clear & wnl. NECK:  Supple w/ fairROM; no JVD; normal carotid impulses w/o bruits; palp thyroid, no nodules felt; no lymphadenopathy. CHEST:  Clear to P & A; without wheezes/ rales/ or rhonchi heard... HEART:  Regular Rhythm; gr1/6 SEM,  no rubs or gallops heard... ABDOMEN:  Soft & nontender; normal bowel sounds; no organomegaly or masses detected. EXT: without deformities, mild arthritic changes; no varicose veins/ venous insuffic/ or edema... +trigger points about the neck/ shoulders w/ decr ROM right shoulder w/ pain... NEURO:  CN's intact; no focal neuro deficits... gait & station are normal. DERM:  No lesions noted; no rash etc...   Assessment & Plan:   COPD/ Smoker>  She denies cough, notes min sputum/ etc; desperately needs to quit smokng- offered counseling, medications, etc but she declines...  Hx mild MVP>  She is asymptomatic w/o CP, palpit, dizziness, SOB, etc...  Hx right sided Acoustic Neuroma- s/p surg 2002 [facility] DrBrowne> no known recurrence to date...  CEREBROVASC Dis w/ basilar tip aneurysm stent & coiling 11/10 by DrDeveshwar; f/u arteriogram 2/11 at [facility] & 8/11 at [facility] showed some compromise of the post circ w/ prolapse of the coils into the P1 segm & DrMorris (IR [facility]) plans repeat arteriogram 53yr;  Note: MRI 2/11 done for new gait abn showed cbll infarct & ASA/ Plavix restarted and she did home phys therapy w/ improvement...  HYPERLIPID>  On Prav40 + diet;  FLP looks OK but needs better diet & take med Qhs...  HYPOTHYROID>  On Levothy tabs taking 1/2 tab daily w/ TSH= 12.56 today; she didn't bring bottles & we will assess regularity of dosing via pill count & pharm, then make rec for dose adjustment if needed.  IBS w/ Constip>  She uses OTC laxatives as needed & we discussed Miralax/ Senakot-S/  etc...  ORTHO>  Prev right shoulder discomfort improved spont & MRI was never done; currently uses Tramadol Prn...  OSTEOPENIA/ Vit D defic>  Stable on Calcium, MVI, Vit D supplement...  ANXIETY>  Uses Tranxene Prn (she has some claustrophobia & needs sedation before MRIs- she doesn't remember the 2/11 MRI being done!).Marland KitchenMarland Kitchen

## 2010-12-02 ENCOUNTER — Encounter: Payer: Self-pay | Admitting: Pulmonary Disease

## 2010-12-05 ENCOUNTER — Telehealth: Payer: Self-pay | Admitting: *Deleted

## 2010-12-05 MED ORDER — LEVOTHYROXINE SODIUM 75 MCG PO TABS: 75.0000 ug | ORAL_TABLET | Freq: Every day | ORAL | Status: DC

## 2010-12-05 NOTE — Telephone Encounter (Signed)
Spoke with SN  And he is aware that the pt has been taking 1/2 tablet by mouth everyday of her current does of thyroid meds.  Per SN--thyroid is still underreplaced so recs to change to synthroid 1 tablet by mouth daily everyday.  Pt is aware per SN to finish her current med of 1 tab by mouth one day and then alternate every other day with 1/2 tablet until med is gone.  New rx has been sent to mail order pharmacy per pts request.

## 2011-02-22 ENCOUNTER — Telehealth: Payer: Self-pay | Admitting: Pulmonary Disease

## 2011-02-22 MED ORDER — TRAMADOL HCL 50 MG PO TABS: 50.0000 mg | ORAL_TABLET | Freq: Three times a day (TID) | ORAL | Status: DC | PRN

## 2011-02-22 NOTE — Telephone Encounter (Signed)
I spoke with pt and she is requesting refill on her tramadol. Pt is requesting #[phone] x [facility]. [facility] has been sent

## 2011-05-19 ENCOUNTER — Telehealth: Payer: Self-pay | Admitting: Pulmonary Disease

## 2011-05-19 MED ORDER — PRAVASTATIN SODIUM 40 MG PO TABS: 40.0000 mg | ORAL_TABLET | Freq: Every day | ORAL | Status: DC

## 2011-05-19 NOTE — Telephone Encounter (Signed)
I spoke with pt and states she needs her rx for pravastatin [facility]. I advised pt will send rx. rx has been sent

## 2011-06-01 ENCOUNTER — Ambulatory Visit (INDEPENDENT_AMBULATORY_CARE_PROVIDER_SITE_OTHER)
Admission: RE | Admit: 2011-06-01 | Discharge: 2011-06-01 | Disposition: A | Discharge status: Home / Self Care | Payer: Medicare Other | Source: Ambulatory Visit | Attending: Pulmonary Disease | Admitting: Pulmonary Disease

## 2011-06-01 ENCOUNTER — Encounter: Payer: Self-pay | Admitting: Pulmonary Disease

## 2011-06-01 ENCOUNTER — Other Ambulatory Visit (INDEPENDENT_AMBULATORY_CARE_PROVIDER_SITE_OTHER): Payer: Medicare Other

## 2011-06-01 ENCOUNTER — Ambulatory Visit (INDEPENDENT_AMBULATORY_CARE_PROVIDER_SITE_OTHER): Payer: Medicare Other | Admitting: Pulmonary Disease

## 2011-06-01 DIAGNOSIS — M899 Disorder of bone, unspecified: Secondary | ICD-10-CM

## 2011-06-01 DIAGNOSIS — E039 Hypothyroidism, unspecified: Secondary | ICD-10-CM

## 2011-06-01 DIAGNOSIS — J449 Chronic obstructive pulmonary disease, unspecified: Secondary | ICD-10-CM

## 2011-06-01 DIAGNOSIS — I872 Venous insufficiency (chronic) (peripheral): Secondary | ICD-10-CM

## 2011-06-01 DIAGNOSIS — F17200 Nicotine dependence, unspecified, uncomplicated: Secondary | ICD-10-CM

## 2011-06-01 DIAGNOSIS — IMO0001 Reserved for inherently not codable concepts without codable children: Secondary | ICD-10-CM

## 2011-06-01 DIAGNOSIS — E785 Hyperlipidemia, unspecified: Secondary | ICD-10-CM

## 2011-06-01 DIAGNOSIS — M949 Disorder of cartilage, unspecified: Secondary | ICD-10-CM

## 2011-06-01 DIAGNOSIS — D333 Benign neoplasm of cranial nerves: Secondary | ICD-10-CM

## 2011-06-01 DIAGNOSIS — I679 Cerebrovascular disease, unspecified: Secondary | ICD-10-CM

## 2011-06-01 DIAGNOSIS — K589 Irritable bowel syndrome without diarrhea: Secondary | ICD-10-CM

## 2011-06-01 DIAGNOSIS — I729 Aneurysm of unspecified site: Secondary | ICD-10-CM

## 2011-06-01 DIAGNOSIS — F411 Generalized anxiety disorder: Secondary | ICD-10-CM

## 2011-06-01 LAB — TSH: TSH: 0.88 u[IU]/mL (ref 0.35–5.50)

## 2011-06-01 MED ORDER — CLORAZEPATE DIPOTASSIUM 7.5 MG PO TABS: 7.5000 mg | ORAL_TABLET | Freq: Three times a day (TID) | ORAL | Status: DC | PRN

## 2011-06-01 NOTE — Progress Notes (Signed)
Subjective:    Patient ID: Ana Santos, female    DOB: [birth year], 70 y.o.   MRN: [id number]  HPI 70 y/o WF here for a follow up visit... she has multiple medical problems as reviewed below...    ~  November 19, 2009:  she developed some gait instability 2/11 & was rechecked by IR- DrDeveshwar> MRI showed right cbll lacunar infarct, & post op changes in right cerebellopontine angle, plus interval endovasc treatment of basilar tip aneurysm;  f/u arteriogram showed angiographic obliteration of the prev basilar tip aneurysm- distal basilar art stent patent, 50% stenosis right PCA... rec to restart PLAVIX + keep ASA 325mg /d, and quit smoking!!!  Her CC is aching & sore, low energy, etc- c/w her FM;  still smoking 1/4 ppd and can't/ won't quit despite everything.  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 she notes pain started soon after her second arteriogram from DrDeveshwar 2/11 (to eval the stent & coils placed in basilar tip aneurysm)...  tried Mobic, Tramadol, Tylenol- all w/o relief...  she saw DrReynolds 9/11- Neurology at [facility]- for f/u of the brain stem strokes seen on MRI, but he felt symptoms were more rheum related (they checked XRays/ CRP/ CPK- all norm) & he rec PT/ balance training/ vestib rehab but the pt declined "I'm getting my balance under control on my own & don't need therapy"...   She saw DrMorris [facility]- interventional neuroradiology- w/ another arteriogram performed 8/11 & showed satis appearance of the basilar aneurysm stent & coiling w/ some prolapse of the coils into the P1 segm w/ mild compromise of the post cerebral art (flow looks adeq & he rec continued ASA/ Plavix, quit smoking, & follow up 87yr)...   She saw DrBrowne [facility] 7/11- for f/u of her acoustic neuroma w/ f/u scan looking good- no recurrence but she's had mild infarction in right  cerebellar hemisphere after her basilar aneurysm treatment as above...  ~  April 04, 2010:  Labs last week showed norm CBC, norm BMet, norm TFTs, Sed=21, neg RA, & weakly pos ANA= 1:40 speckled... we gave her a Sterapred Dosepak (10mg  6d pack) w/ dramatic improvement in her diffuse aches/ pains (arms/ shoulders much improved)... she states that her husb had to help her get dressed x56mo due to the pain & after 2d Pred (60-50) she was able to dress herself... she has some resid/ persistant upper arm/ shoulder discomfort & restriction & we decided to continue Pred at 5mg /d for now & refer to Rheum to help sort this out (ie- doesn't sound like PMR due to norm sed)... needs yearly f/u CXR- still smokes- stable, NAD.Marland Kitchen.  ~  May 23, 2010:  She saw DrTruslow for rheum eval & he stopped her Pred, otherw no ch in meds, gave her a shot in shoulder & plans continued follow up (she is encouraged to call him for symptoms for his input & on-going care)... despite everything she continues to smoke 5-6 cig/d... mult somatic complaints on & off> using Tramadol, Tylenol...  OK 2011 Flu vaccine today.  ~  August 25, 2010:  Her CC has been her right shoulder pain & several shots from DrTruslow gave only temp relief;  she was seen by Ortho DrNorris who suspects a rotator cuff tear & wanted to image the shoulder (2 questions: can she get MRI, and if  not can she come off Plavix to do CT arthrogram?> she is reluctant to stop the Plavix, but I checked w/ IR at [facility] & her stent/ coils are MR compatible, and she had MRI Brain 2/11 after coiling done 11/10);  I called Dr Ranell Patrick regarding the MRI of her right shoulder...    Otherw she is feeling well, unfortunately still smoking 5-6 cig/d & can't vs won't quit;  no change in breathing, no CP, palpit, or cerebral ischemic symptoms;  CXR & Labs from 10/11 reviewed w/ pt;  her prev aching/ soreness/ PMR-like symptoms resolved & have not returned...   ~  December 01, 2010:  57mo ROV & she  is stable> working regularly doing alterations again & she loves it... Note: she did not bring med bottles to review... She states that DrNorris never called her back to let her know if her stent was MRI compatible (we had discovered that her coils were MR compatible & as noted she had an MRI 07/31/09 after the coils & stent were done so they are both clearly compatible); nevertheless she states her right shoulder discomfort improved markedly on it's own & she is feeling much better w/ just an occas Tramadol for pain;  She is waiting to hear from [facility] IR DrMorris regarding f/u visit due 8/12, she is doing well w/o cerebral ischemic symptoms & is tolerating her ASA/ Plavix w/o difficulty, bleeding, etc... Due for fasting blood work today> see below...  ~  June 01, 2011:  24mo ROV & she is doing well overall, just notices some minor discomfort in her right groin area "ever since the aneurysm treatment" but exam is neg- no hernia bulge, no bruit, etc... COPD> still smoking 5-6 cig/d & can't vs won't quit; not on meds, denies cough/ sputum/ hemoptysis/ SOB/ ch in DOE, etc; CXR today COPD, NAD... Cerebrovasc dis/ Aneurysm> she had f/u DrMorris IR at [facility] 8/12 w/ CTA that looked good- no resid aneurysm, distal flow appeared good & he didn't rec any changes & will f/u again in 52yrs; she is on ASA/ Plavix & is reluctant to stop the Plavix Rx since she is doing so well... Hyperlipid> on Prav40; tol well & FLPs have looked good on this med & dose... Hypothyroid> last OV her TSH was 12.6 on 65mcg/d so we incr to 75; TSH today is improved 0.88 & she feels well... DJD/ FM> she is managing quite well w/ prn Tramadol & Tylenol... Osteopenia> she had min osteopenia on BMD several yrs ago & takes Calcium, MVI, Vit D 1000u daily... Acoustic Neuroma> this was resected by DrBrowne [facility] 2002; last MRI 2/11 continued to look good- post op changes, no recurrent tumor seen; we discussed f/u MRI in 2013 here if not done by DrBrowne  in the interval... Anxiety> on Tranxene as needed...   Problem List:   COPD (ICD-496) - she has a min smoker's cough, sm amt beige sputum... ~  CXR 5/08 w/ chr changes, LLL scar, NAD. ~  CXR 7/09 showed norm hrt size, clear lungs, NAD. ~  CXR 9/10 showed NAD. ~  CXR 10/11 showed chr changes, NAD. ~  CXR 12/12 showed COPD, NAD.  CIGARETTE SMOKER (ICD-305.1) - can't vs won't quit and not interested in smoking cessation help... now she states she's decr to ~1/4 ppd but can't seem to improve from there...  MITRAL VALVE PROLAPSE (ICD-424.0) & Hx of CHEST PAIN (ICD-786.50) - she had CP in the past and a neg cath 1995...  ~  CP eval 1995 w/ cath showing min 10% luminal irreg RCA & mild MVP, norm LVF... ~  Cardiolite 1999 was neg- no ischemia, no infarct, EF=66%... ~  baseline EKG showed NSR, NSSTTWA (poor R prog V1-V3)...  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 referred to [facility] w/ Angiogram confirmation & subseq stent assisted coiling of the aneurysm 11/10... subseq f/u angiogram 2/11 showed complete angiographic obliteration of the aneurysm, & patent stent in distal basilar art (there was a new 50%stenosis in the right PCA)... on ASA 325mg /d & PLAVIX restarted 2/11 w/ new gait abn & cbll infarct on MRI. ~  she saw DrMorris [facility]- interventional neuroradiology- w/ another arteriogram performed 8/11- it showed satis appearance of the basilar aneurysm stent & coiling w/ some prolapse of the coils into the P1 segm w/ mild compromise of the post cerebral art (flow looks adeq & he rec continued ASA/ Plavix therapy & f/u 40yr). ~  She had f/u DrMorris 8/12 w/ CTA that looked good- no resid aneurysm, distal flow appeared good & he didn't rec any changes- f/u planned 33yrs.  VENOUS INSUFFICIENCY (ICD-459.81) - she tells me that she saw DrKrush for vein surgery and treatment ("it's covered by my husb's  insurance").  HYPERLIPIDEMIA (ICD-272.4) - now on PRAVASTATIN 40mg /d & tol well... we reviewed low chol/ low fat diet. ~  chart review shows TChol ~ 200 range in the 80's and early 90's. ~  then TChol incr to ~250 range in late 90's and Lipitor started-  ~  FLP 5/08 on Lipitor 10mg /d showed TChol 164, TG 153, HDL 45, LDL 89. ~  in 2009 c/o no energy & arm discomfort she felt was from the Lip10- therefore DC'd. ~  FLP 7/09 on diet alone showed TChol 234, TG 218, HDL 40, LDL 139... rec- start Prav40. ~  FLP 3/10 on Prav40 showed TChol 182, Tg 137, HDL 47, LDL 107 ~  FLP 6/11 on Prav40 showed TChol 162, TG 230, HDL 37, LDL 88 ~  FLP 6/12 on Prav40 showed TChol 177, TG 139, HDL 44, LDL 105... Contin diet, take med every day.  HYPOTHYROIDISM (ICD-244.9) - stable on LEVOTHYROID 57mcg/d now... hx Hashimoto's thyroiditis in 1989 w/ markedly pos antimicrosomal antibodies and transient hyperthy labs...  ~  labs 5/08 showed TSH= 0.53... rec- continue Levo100/d. ~  labs 7/09 showed TSH= 0.07... rec- decr Levothy100 to 1/2 daily. ~  labs 3/10 showed TSH= 17.71... rec> incr back to 161mcg/d. ~  labs 9/10 showed TSH= 0.21... continue same. ~  labs 6/11 showed TSH= 0.10... rec decr Levo100 to 1/2 daily. ~  labs 10/11 on Levo50 showed TSH= 1.25 (FreeT3 & FreeT4 normal) ~  Labs 6/12 on Levo50 showed TSH= 12.56 ==> we will contact pt & pharm to determine if taking Levo100-1/2tab daily every day? & adjust. ~  Labs 12/12 on Levo75 showed TSH= 0.88... Continue same.  IRRITABLE BOWEL SYNDROME (ICD-564.1) - had colonoscopy by DrPatterson 11/06 that was WNL.Marland Kitchen. she notes some constipation & Rx w/ colase OTC...  NEPHROLITHIASIS (ICD-592.0) - last stone passed in 1993, no known recurrence since then...  Hx of TENDINITIS, RIGHT ELBOW (ICD-727.09) Hx right shoulder pain w/ eval by DrNorris (MRI was planned butnever done & symptoms improved spont)... FIBROMYALGIA (ICD-729.1) - prev severe symptoms may have reflected a  flair in her FM> but much improved on Pred Rx. ~  labs 9/10 showed Sed= 24 ~  labs 6/11 showed  Sed= 30 ~  labs 10/11 showed CBC=norm, Chems=norm, TFTs=norm, Sed=21, RF=neg, ANA+1:40 speckled... ~  12/11:  pt saw DrTruslow & his note is pending- she indicates he stopped her Pred, & gave her shot in shoulder.  OSTEOPENIA (ICD-733.90) - BMD here 7/09 showed TScores -0.8 in Spine, and -1.2 in right FemNeck... rec to take Calcium, MVI, Vit D...  VITAMIN D DEFICIENCY (ICD-268.9) ~  labs 7/09 showed Vit D level = 16... rec- start Vit D 50K weekly. ~  labs 3/10 showed Vit D level = 69... rec> change to 1000 u daily. ~  labs 6/11 showed Vit D level = 55... continue same.  Hx of ACOUSTIC NEUROMA (ICD-225.1) - s/p surg for this tumor 10/02 at [facility]... no known recurrence... ~  MRI Brain 2/07 showed s/p translabyrinthine approach to right acoustic neuroma w/o recurrence, NAD... ~  MRI Brain 10/10 showed no recurrence of tumor, but 4-10mm basilar tip aneurysm detected & Rx as above. ~  MRI Brain 2/11 showed no recurrence/ post op changes, endovasc oblit of basilar aneurysm, cerebellar lacunar infarct & 50% stenosis of right PCA... PLAVIX restarted... she had review by [facility] IR & Neurology (as above)... ~  We discussed f/u MRI in 2013 if not done by drBrowne in the interim...  ANXIETY (ICD-300.00) - uses CHLORAZEPATE 7.5mg Tid... she gets claustrophobic and needs sedation before MRIs.   Past Surgical History  Procedure Date  . Translabyrinthine procedure 2002    [facility] Dr. Erroll Luna  . Total abdominal hysterectomy   . Breast lumpectomy   . Aneurysm coiling 11-10    Dr. Corliss Skains    Outpatient Encounter Prescriptions as of 06/01/2011  Medication Sig Dispense Refill  . acetaminophen (TYLENOL) 500 MG tablet Take 500 mg by mouth every 6 (six) hours as needed.        Marland Kitchen aspirin 325 MG tablet Take 325 mg by mouth daily.        . Calcium Carbonate-Vitamin D (CALTRATE 600+D) 600-400 MG-UNIT per tablet Take  1 tablet by mouth 2 (two) times daily.        . cholecalciferol (VITAMIN D) 1000 UNITS tablet Take 1,000 Units by mouth daily.        . clopidogrel (PLAVIX) 75 MG tablet Take 75 mg by mouth daily.        . clorazepate (TRANXENE) 7.5 MG tablet Take 7.5 mg by mouth 3 (three) times daily as needed.        Marland Kitchen levothyroxine (SYNTHROID) 75 MCG tablet Take 1 tablet (75 mcg total) by mouth daily.  90 tablet  3  . pravastatin (PRAVACHOL) 40 MG tablet Take 1 tablet (40 mg total) by mouth daily.  90 tablet  0  . senna-docusate (STOOL SOFTENER & LAXATIVE) 8.6-50 MG per tablet Take 2 tablets by mouth at bedtime.        . traMADol (ULTRAM) 50 MG tablet Take 1 tablet (50 mg total) by mouth 3 (three) times daily as needed for pain.  90 tablet  3     Allergies  Allergen Reactions  . Atorvastatin     REACTION: INTOL to Lipitor w/ arm pain (3/09)  . Hydrocodone-Acetaminophen     REACTION: hallucinations  . Morphine     REACTION: hallucinations    Current Medications, Allergies, Past Medical History, Past Surgical History, Family History, and Social History were reviewed in [employer] record.   Review of Systems         See HPI - all other systems neg  except as noted... The patient complains of dyspnea on exertion.  The patient denies anorexia, fever, weight loss, weight gain, vision loss, decreased hearing, hoarseness, chest pain, syncope, peripheral edema, prolonged cough, headaches, hemoptysis, abdominal pain, melena, hematochezia, severe indigestion/heartburn, hematuria, incontinence, muscle weakness, suspicious skin lesions, transient blindness, difficulty walking, depression, unusual weight change, abnormal bleeding, enlarged lymph nodes, and angioedema.     Objective:   Physical Exam     WD, WN, 70 y/o WF in NAD... GENERAL:  Alert & oriented; pleasant & cooperative... HEENT:  [place]/AT, EOM-wnl, PERRLA, EACs-clear, TMs- some scarring on right, NOSE-clear, THROAT-clear &  wnl. NECK:  Supple w/ fairROM; no JVD; normal carotid impulses w/o bruits; palp thyroid, no nodules felt; no lymphadenopathy. CHEST:  Clear to P & A; without wheezes/ rales/ or rhonchi heard... HEART:  Regular Rhythm; gr1/6 SEM,  no rubs or gallops heard... ABDOMEN:  Soft & nontender; normal bowel sounds; no organomegaly or masses detected. EXT: without deformities, mild arthritic changes; no varicose veins/ venous insuffic/ or edema... +trigger points about the neck/ shoulders w/ decr ROM right shoulder w/ pain... NEURO:  CN's intact; no focal neuro deficits... gait & station are normal. DERM:  No lesions noted; no rash etc...  RADIOLOGY DATA:  Reviewed in the EPIC EMR & discussed w/ the patient...  LABORATORY DATA:  Reviewed in the EPIC EMR & discussed w/ the patient...   Assessment & Plan:   COPD/ Smoker>  She denies cough, notes min sputum/ etc; desperately needs to quit smokng- offered counseling, medications, etc but she declines...  Hx mild MVP>  She is asymptomatic w/o CP, palpit, dizziness, SOB, etc...  Hx right sided Acoustic Neuroma- s/p surg 2002 at [facility] DrBrowne> no known recurrence to date...  CEREBROVASC Dis w/ basilar tip aneurysm stent & coiling 11/10 by DrDeveshwar; f/u arteriogram 2/11 at [facility] & 8/11 at [facility] showed some compromise of the post circ w/ prolapse of the coils into the P1 segm;  Note: MRI 2/11 done for new gait abn showed cbll infarct & ASA/ Plavix restarted and she did home phys therapy w/ improvement;  Repeat IR eval 8/12 w/ CTA showed oblit of the aneurysm & distal flow appears good as well- they rec repeat studies in 2 yrs.  HYPERLIPID>  On Prav40 + diet;  FLP looks OK but needs better diet & take med Qhs...  HYPOTHYROID>  On Levothy 39mcg/d now w/ TSH= 0.88 today; she is rec to continue this dose...  IBS w/ Constip>  She uses OTC laxatives as needed & we discussed Miralax/ Senakot-S/ etc...  ORTHO>  Prev right shoulder discomfort improved spont & MRI  was never done; currently uses Tramadol Prn...  OSTEOPENIA/ Vit D defic>  Stable on Calcium, MVI, Vit D supplement...  ANXIETY>  Uses Tranxene Prn (she has some claustrophobia & needs sedation before MRIs- she doesn't remember the 2/11 MRI being done!).Marland KitchenMarland Kitchen   Patient's Medications  New Prescriptions   No medications on file  Previous Medications   ACETAMINOPHEN (TYLENOL) 500 MG TABLET    Take 500 mg by mouth every 6 (six) hours as needed.     ASPIRIN 325 MG TABLET    Take 325 mg by mouth daily.     CALCIUM CARBONATE-VITAMIN D (CALTRATE 600+D) 600-400 MG-UNIT PER TABLET    Take 1 tablet by mouth 2 (two) times daily.     CHOLECALCIFEROL (VITAMIN D) 1000 UNITS TABLET    Take 1,000 Units by mouth daily.     CLOPIDOGREL (PLAVIX) 75  MG TABLET    Take 75 mg by mouth daily.     LEVOTHYROXINE (SYNTHROID) 75 MCG TABLET    Take 1 tablet (75 mcg total) by mouth daily.   PRAVASTATIN (PRAVACHOL) 40 MG TABLET    Take 1 tablet (40 mg total) by mouth daily.   SENNA-DOCUSATE (STOOL SOFTENER & LAXATIVE) 8.6-50 MG PER TABLET    Take 2 tablets by mouth at bedtime.     TRAMADOL (ULTRAM) 50 MG TABLET    Take 1 tablet (50 mg total) by mouth 3 (three) times daily as needed for pain.  Modified Medications   Modified Medication Previous Medication   CLORAZEPATE (TRANXENE) 7.5 MG TABLET clorazepate (TRANXENE) 7.5 MG tablet      Take 1 tablet (7.5 mg total) by mouth 3 (three) times daily as needed.    Take 7.5 mg by mouth 3 (three) times daily as needed.    Discontinued Medications   No medications on file

## 2011-06-01 NOTE — Patient Instructions (Addendum)
Today we updated your med list in our EPIC system...    Continue your current medications the same...  Try the [facility] & Senakot-S for your constipation...  Today we did your follow up CXR & TSH (thyroid) check...Marland KitchenMarland KitchenMarland Kitchen    Please call the PHONE TREE in a few days for your results...    Dial [id number] & when prompted enter your patient number followed by the # symbol...    Your patient number is:  [id number]#  Please please please cut back on the smoking & work on smoking cessation...  Call for any problems...  Let's plan a follow up visit in about 6 months time.Marland KitchenMarland Kitchen

## 2011-06-09 ENCOUNTER — Ambulatory Visit (INDEPENDENT_AMBULATORY_CARE_PROVIDER_SITE_OTHER): Payer: Medicare Other

## 2011-06-09 DIAGNOSIS — Z23 Encounter for immunization: Secondary | ICD-10-CM

## 2011-06-19 ENCOUNTER — Telehealth: Payer: Self-pay | Admitting: Pulmonary Disease

## 2011-06-19 MED ORDER — TRAMADOL HCL 50 MG PO TABS: 50.0000 mg | ORAL_TABLET | Freq: Three times a day (TID) | ORAL | Status: DC | PRN

## 2011-06-19 NOTE — Telephone Encounter (Signed)
Please advise if okay to refill tramadol for a 90 day supply- # 270 tablets. Thanks

## 2011-07-05 ENCOUNTER — Telehealth: Payer: Self-pay | Admitting: Pulmonary Disease

## 2011-07-05 MED ORDER — CLOPIDOGREL BISULFATE 75 MG PO TABS: 75.0000 mg | ORAL_TABLET | Freq: Every day | ORAL | Status: DC

## 2011-07-05 NOTE — Telephone Encounter (Signed)
I spoke with pt and she needed rx [facility]. Pt is aware and nothing further was needed

## 2011-09-14 ENCOUNTER — Telehealth: Payer: Self-pay | Admitting: Pulmonary Disease

## 2011-09-14 MED ORDER — PRAVASTATIN SODIUM 40 MG PO TABS: 40.0000 mg | ORAL_TABLET | Freq: Every day | ORAL | Status: DC

## 2011-09-14 NOTE — Telephone Encounter (Signed)
I spoke with pt and is aware her pravastatin rx has been sent to the pharmacy. She voiced her understanding and needed nothing further was needed

## 2011-09-26 ENCOUNTER — Other Ambulatory Visit: Payer: Self-pay | Admitting: Pulmonary Disease

## 2011-09-26 ENCOUNTER — Telehealth: Payer: Self-pay | Admitting: Pulmonary Disease

## 2011-09-26 DIAGNOSIS — R079 Chest pain, unspecified: Secondary | ICD-10-CM

## 2011-09-26 NOTE — Telephone Encounter (Signed)
Per SN---ok to add on in the am at 8:45--will need to come earlier for cxr prior to ov.  Order has already been placed for the cxr.  thanks

## 2011-09-26 NOTE — Telephone Encounter (Signed)
Pt hasn't heard anything yet. Wants to know something

## 2011-09-26 NOTE — Telephone Encounter (Signed)
Called, spoke with pt.  I informed her SN can see her tomorrow morning at 8:45 am but she will need to arrive at approx 8:30 am for cxr prior.  She verbalized understanding of this, is aware order placed for cxr, and was advised to seek emergency care/urgent care if sxs worsen prior to this appt.  She verbalized understanding of this and voiced no further questions/concerns at this time.

## 2011-09-26 NOTE — Telephone Encounter (Signed)
Called, spoke with pt.  She c/o a constant, squeezing pain behind her right breast x 4 days.  States pain is worse when moving or breathing deeply.  Reports she does have some increased SOB and dizziness from the pain.  Also, states both of her jaws hurt "off and on" but this has been going on "for a while" prior to the pain.  Denies any n/v, changes in vision, or difficulty swallowing.  Has been taking tylenol and tramadol with only slight relief.  Requesting to be seen today asap - pt stated she does not feel she needs to go to the ER or Urgent Care and declined OV with TP for today stating "I've seen Dr. Kriste Basque for 30 yrs and he's the only dr Ana Santos ever seen."  I advised SN does not have any openings today; therefore, I would have to send msg to him to advise.  Advised ER/urgent care if sxs worsen.  She verbalized understanding.  Dr. Ernest Mallick, pls advise if pt can be worked in.  Thank you.  [facility] in [place]  Allergies verified  Allergies  Allergen Reactions  . Atorvastatin     REACTION: INTOL to Lipitor w/ arm pain (3/09)  . Hydrocodone-Acetaminophen     REACTION: hallucinations  . Morphine     REACTION: hallucinations

## 2011-09-27 ENCOUNTER — Ambulatory Visit (INDEPENDENT_AMBULATORY_CARE_PROVIDER_SITE_OTHER): Payer: Medicare Other | Admitting: Pulmonary Disease

## 2011-09-27 ENCOUNTER — Encounter: Payer: Self-pay | Admitting: Pulmonary Disease

## 2011-09-27 ENCOUNTER — Ambulatory Visit (INDEPENDENT_AMBULATORY_CARE_PROVIDER_SITE_OTHER)
Admission: RE | Admit: 2011-09-27 | Discharge: 2011-09-27 | Disposition: A | Discharge status: Home / Self Care | Payer: Medicare Other | Source: Ambulatory Visit | Attending: Pulmonary Disease | Admitting: Pulmonary Disease

## 2011-09-27 VITALS — BP 124/70 | HR 58 | Temp 97.2°F | Ht 62.0 in | Wt 135.0 lb

## 2011-09-27 DIAGNOSIS — M899 Disorder of bone, unspecified: Secondary | ICD-10-CM

## 2011-09-27 DIAGNOSIS — R071 Chest pain on breathing: Secondary | ICD-10-CM | POA: Diagnosis not present

## 2011-09-27 DIAGNOSIS — E039 Hypothyroidism, unspecified: Secondary | ICD-10-CM

## 2011-09-27 DIAGNOSIS — R0602 Shortness of breath: Secondary | ICD-10-CM | POA: Diagnosis not present

## 2011-09-27 DIAGNOSIS — F411 Generalized anxiety disorder: Secondary | ICD-10-CM

## 2011-09-27 DIAGNOSIS — IMO0001 Reserved for inherently not codable concepts without codable children: Secondary | ICD-10-CM

## 2011-09-27 DIAGNOSIS — R059 Cough, unspecified: Secondary | ICD-10-CM | POA: Diagnosis not present

## 2011-09-27 DIAGNOSIS — I679 Cerebrovascular disease, unspecified: Secondary | ICD-10-CM

## 2011-09-27 DIAGNOSIS — F17200 Nicotine dependence, unspecified, uncomplicated: Secondary | ICD-10-CM | POA: Diagnosis not present

## 2011-09-27 DIAGNOSIS — M949 Disorder of cartilage, unspecified: Secondary | ICD-10-CM

## 2011-09-27 DIAGNOSIS — E785 Hyperlipidemia, unspecified: Secondary | ICD-10-CM

## 2011-09-27 DIAGNOSIS — R079 Chest pain, unspecified: Secondary | ICD-10-CM | POA: Diagnosis not present

## 2011-09-27 DIAGNOSIS — R05 Cough: Secondary | ICD-10-CM | POA: Diagnosis not present

## 2011-09-27 DIAGNOSIS — J449 Chronic obstructive pulmonary disease, unspecified: Secondary | ICD-10-CM | POA: Diagnosis not present

## 2011-09-27 DIAGNOSIS — R0789 Other chest pain: Secondary | ICD-10-CM

## 2011-09-27 MED ORDER — CYCLOBENZAPRINE HCL 10 MG PO TABS: ORAL_TABLET | ORAL | Status: DC

## 2011-09-27 MED ORDER — OXYCODONE-ACETAMINOPHEN 5-325 MG PO TABS: ORAL_TABLET | ORAL | Status: DC

## 2011-09-27 NOTE — Patient Instructions (Addendum)
Today we updated your med list in our EPIC system...    Continue your current medications the same...  Your right chest discomfort is some Chest wall pain & muscle spasm from doing the yard work & inflammation>>    Rest the chest> no heavy lifting  Etc...    Put some heat on the area> hot towel, heating pad, soak in tub, etc...    You could try Myoflex cream when up & about...    New RX: PERCOCET- strong pain pill, up to 3 times daily as needed for pain.    New muscle relaxer- FLEXERIL take 1/2 to 1 tab up to 3 times daily as needed for muscle spasm...  Call for any questions.Marland KitchenMarland Kitchen

## 2011-09-27 NOTE — Progress Notes (Addendum)
Subjective:    Patient ID: Ana Santos, female    DOB: 1940-07-02, 71 y.o.   MRN: [id number]  HPI 71 y/o WF here for a follow up visit... she has multiple medical problems as reviewed below...    ~  Jun11:  she developed some gait instability 2/11 & was rechecked by IR- DrDeveshwar> MRI showed right cbll lacunar infarct, & post op changes in right cerebellopontine angle, plus interval endovasc treatment of basilar tip aneurysm;  f/u arteriogram showed angiographic obliteration of the prev basilar tip aneurysm- distal basilar art stent patent, 50% stenosis right PCA... rec to restart PLAVIX + keep ASA 325mg /d, and quit smoking!!!  Her CC is aching & sore, low energy, etc- c/w her FM;  still smoking 1/4 ppd and can't/ won't quit despite everything. 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 she notes pain started soon after her second arteriogram from DrDeveshwar 2/11 (to eval the stent & coils placed in basilar tip aneurysm)...  tried Mobic, Tramadol, Tylenol- all w/o relief...  she saw DrReynolds 9/11- Neurology at [facility]- for f/u of the brain stem strokes seen on MRI, but he felt symptoms were more rheum related (they checked XRays/ CRP/ CPK- all norm) & he rec PT/ balance training/ vestib rehab but the pt declined "I'm getting my balance under control on my own & don't need therapy"...   She saw DrMorris [facility]- interventional neuroradiology- w/ another arteriogram performed 8/11 & showed satis appearance of the basilar aneurysm stent & coiling w/ some prolapse of the coils into the P1 segm w/ mild compromise of the post cerebral art (flow looks adeq & he rec continued ASA/ Plavix, quit smoking, & follow up 64yr)...   She saw DrBrowne [facility] 7/11- for f/u of her acoustic neuroma w/ f/u scan looking good- no recurrence but she's had mild infarction in right cerebellar hemisphere after  her basilar aneurysm treatment as above... ~  Oct11:  Labs last week showed norm CBC, norm BMet, norm TFTs, Sed=21, neg RA, & weakly pos ANA= 1:40 speckled... we gave her a Sterapred Dosepak (10mg  6d pack) w/ dramatic improvement in her diffuse aches/ pains (arms/ shoulders much improved)... she states that her husb had to help her get dressed x75mo due to the pain & after 2d Pred (60-50) she was able to dress herself... she has some resid/ persistant upper arm/ shoulder discomfort & restriction & we decided to continue Pred at 5mg /d for now & refer to Rheum to help sort this out (ie- doesn't sound like PMR due to norm sed)... needs yearly f/u CXR- still smokes- stable, NAD.Marland Kitchen. ~  Dec11:  She saw DrTruslow for rheum eval & he stopped her Pred, otherw no ch in meds, gave her a shot in shoulder & plans continued follow up (she is encouraged to call him for symptoms for his input & on-going care)... despite everything she continues to smoke 5-6 cig/d... mult somatic complaints on & off> using Tramadol, Tylenol...  OK 2011 Flu vaccine today.  ~  August 25, 2010:  Her CC has been her right shoulder pain & several shots from DrTruslow gave only temp relief;  she was seen by Ortho DrNorris who suspects a rotator cuff tear & wanted to image the shoulder (2 questions: can she get MRI, and if not can she come off Plavix to do CT arthrogram?> she  is reluctant to stop the Plavix, but I [facility]/ [facility] & her stent/ coils are MR compatible, and she had MRI Brain 2/11 after coiling done 11/10);  I called Dr Ranell Patrick regarding the MRI of her right shoulder...    Otherw she is feeling well, unfortunately still smoking 5-6 cig/d & can't vs won't quit;  no change in breathing, no CP, palpit, or cerebral ischemic symptoms;  CXR & Labs from 10/11 reviewed w/ pt;  her prev aching/ soreness/ PMR-like symptoms resolved & have not returned...   ~  December 01, 2010:  29mo ROV & she is stable> working regularly doing alterations again &  she loves it... Note: she did not bring med bottles to review... She states that DrNorris never called her back to let her know if her stent was MRI compatible (we had discovered that her coils were MR compatible & as noted she had an MRI 07/31/09 after the coils & stent were done so they are both clearly compatible); nevertheless she states her right shoulder discomfort improved markedly on it's own & she is feeling much better w/ just an occas Tramadol for pain;  She is waiting to hear from [facility] IR DrMorris regarding f/u visit due 8/12, she is doing well w/o cerebral ischemic symptoms & is tolerating her ASA/ Plavix w/o difficulty, bleeding, etc... Due for fasting blood work today> see below...  ~  June 01, 2011:  37mo ROV & she is doing well overall, just notices some minor discomfort in her right groin area "ever since the aneurysm treatment" but exam is neg- no hernia bulge, no bruit, etc... COPD> still smoking 5-6 cig/d & can't vs won't quit; not on meds, denies cough/ sputum/ hemoptysis/ SOB/ ch in DOE, etc; CXR today COPD, NAD... Cerebrovasc dis/ Aneurysm> she had f/u DrMorris IR at [facility] 8/12 w/ CTA that looked good- no resid aneurysm, distal flow appeared good & he didn't rec any changes & will f/u again in 68yrs; she is on ASA/ Plavix & is reluctant to stop the Plavix Rx since she is doing so well... Hyperlipid> on Prav40; tol well & FLPs have looked good on this med & dose... Hypothyroid> last OV her TSH was 12.6 on 25mcg/d so we incr to 75; TSH today is improved 0.88 & she feels well... DJD/ FM> she is managing quite well w/ prn Tramadol & Tylenol... Osteopenia> she had min osteopenia on BMD several yrs ago & takes Calcium, MVI, Vit D 1000u daily... Acoustic Neuroma> this was resected by DrBrowne [facility] 2002; last MRI 2/11 continued to look good- post op changes, no recurrent tumor seen; we discussed f/u MRI in 2013 here if not done by DrBrowne in the interval... Anxiety> on Tranxene as  needed...  ~  September 27, 2011:  27mo ROV & add-on for acute right chest pain under right breast> present for 1wk, started after yard work, right axillary area, sharp, worse w/ DB, worse w/ movement, no hyperesthesia in skin, no rash, +tender to deep palp; using tramadol/ Tylenol- sl improved but persists> we discussed CWP & treatment w/ smoking cessation, rest, heat, Percocet Tid prn, and Flexeril Tid prn...  Addendum:  CWP resolved after 4d therapy- doing well...   Problem List:   COPD (ICD-496) - she has a min smoker's cough, sm amt beige sputum... ~  CXR 5/08 w/ chr changes, LLL scar, NAD. ~  CXR 7/09 showed norm hrt size, clear lungs, NAD. ~  CXR 9/10 showed NAD. ~  CXR 10/11 showed chr changes, NAD. ~  CXR 12/12 showed COPD, NAD.  CIGARETTE SMOKER (ICD-305.1) - can't vs won't quit and not interested in smoking cessation help... now she states she's decr to ~1/4 ppd but can't seem to improve from there...  MITRAL VALVE PROLAPSE (ICD-424.0) & Hx of CHEST PAIN (ICD-786.50) - she had CP in the past and a neg cath 1995...  ~  CP eval 1995 w/ cath showing min 10% luminal irreg RCA & mild MVP, norm LVF... ~  Cardiolite 1999 was neg- no ischemia, no infarct, EF=66%... ~  baseline EKG showed NSR, NSSTTWA (poor R prog V1-V3)...  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 referred to [facility] w/ Angiogram confirmation & subseq stent assisted coiling of the aneurysm 11/10... subseq f/u angiogram 2/11 showed complete angiographic obliteration of the aneurysm, & patent stent in distal basilar art (there was a new 50%stenosis in the right PCA)... on ASA 325mg /d & PLAVIX restarted 2/11 w/ new gait abn & cbll infarct on MRI. ~  she saw DrMorris [facility]- interventional neuroradiology- w/ another arteriogram performed 8/11- it showed satis appearance of the basilar aneurysm stent & coiling w/ some prolapse of the coils into the P1  segm w/ mild compromise of the post cerebral art (flow looks adeq & he rec continued ASA/ Plavix therapy & f/u 91yr). ~  She had f/u DrMorris 8/12 w/ CTA that looked good- no resid aneurysm, distal flow appeared good & he didn't rec any changes- f/u planned 39yrs.  VENOUS INSUFFICIENCY (ICD-459.81) - she tells me that she saw DrKrush for vein surgery and treatment ("it's covered by my husb's insurance").  HYPERLIPIDEMIA (ICD-272.4) - now on PRAVASTATIN 40mg /d & tol well... we reviewed low chol/ low fat diet. ~  chart review shows TChol ~ 200 range in the 80's and early 90's. ~  then TChol incr to ~250 range in late 90's and Lipitor started-  ~  FLP 5/08 on Lipitor 10mg /d showed TChol 164, TG 153, HDL 45, LDL 89. ~  in 2009 c/o no energy & arm discomfort she felt was from the Lip10- therefore DC'd. ~  FLP 7/09 on diet alone showed TChol 234, TG 218, HDL 40, LDL 139... rec- start Prav40. ~  FLP 3/10 on Prav40 showed TChol 182, Tg 137, HDL 47, LDL 107 ~  FLP 6/11 on Prav40 showed TChol 162, TG 230, HDL 37, LDL 88 ~  FLP 6/12 on Prav40 showed TChol 177, TG 139, HDL 44, LDL 105... Contin diet, take med every day.  HYPOTHYROIDISM (ICD-244.9) - stable on LEVOTHYROID 18mcg/d now... hx Hashimoto's thyroiditis in 1989 w/ markedly pos antimicrosomal antibodies and transient hyperthy labs...  ~  labs 5/08 showed TSH= 0.53... rec- continue Levo100/d. ~  labs 7/09 showed TSH= 0.07... rec- decr Levothy100 to 1/2 daily. ~  labs 3/10 showed TSH= 17.71... rec> incr back to 130mcg/d. ~  labs 9/10 showed TSH= 0.21... continue same. ~  labs 6/11 showed TSH= 0.10... rec decr Levo100 to 1/2 daily. ~  labs 10/11 on Levo50 showed TSH= 1.25 (FreeT3 & FreeT4 normal) ~  Labs 6/12 on Levo50 showed TSH= 12.56 ==> we will contact pt & pharm to determine if taking Levo100-1/2tab daily every day? & adjust. ~  Labs 12/12 on Levo75 showed TSH= 0.88... Continue same.  IRRITABLE BOWEL SYNDROME (ICD-564.1) - had colonoscopy by  DrPatterson 11/06 that was WNL.Marland Kitchen. she notes some constipation & Rx w/ colase OTC.Marland KitchenMarland Kitchen  NEPHROLITHIASIS (ICD-592.0) - last stone passed in 1993, no known recurrence since then...  Hx of TENDINITIS, RIGHT ELBOW (ICD-727.09) Hx right shoulder pain w/ eval by DrNorris (MRI was planned butnever done & symptoms improved spont)... FIBROMYALGIA (ICD-729.1) - prev severe symptoms may have reflected a flair in her FM> but much improved on Pred Rx. ~  labs 9/10 showed Sed= 24 ~  labs 6/11 showed Sed= 30 ~  labs 10/11 showed CBC=norm, Chems=norm, TFTs=norm, Sed=21, RF=neg, ANA+1:40 speckled... ~  12/11:  pt saw DrTruslow & his note is pending- she indicates he stopped her Pred, & gave her shot in shoulder.  OSTEOPENIA (ICD-733.90) - BMD here 7/09 showed TScores -0.8 in Spine, and -1.2 in right FemNeck... rec to take Calcium, MVI, Vit D...  VITAMIN D DEFICIENCY (ICD-268.9) ~  labs 7/09 showed Vit D level = 16... rec- start Vit D 50K weekly. ~  labs 3/10 showed Vit D level = 69... rec> change to 1000 u daily. ~  labs 6/11 showed Vit D level = 55... continue same.  Hx of ACOUSTIC NEUROMA (ICD-225.1) - s/p surg for this tumor 10/02 at [facility]... no known recurrence... ~  MRI Brain 2/07 showed s/p translabyrinthine approach to right acoustic neuroma w/o recurrence, NAD... ~  MRI Brain 10/10 showed no recurrence of tumor, but 4-18mm basilar tip aneurysm detected & Rx as above. ~  MRI Brain 2/11 showed no recurrence/ post op changes, endovasc oblit of basilar aneurysm, cerebellar lacunar infarct & 50% stenosis of right PCA... PLAVIX restarted... she had review by [facility] IR & Neurology (as above)... ~  We discussed f/u MRI in 2013 if not done by drBrowne in the interim...  ANXIETY (ICD-300.00) - uses CHLORAZEPATE 7.5mg Tid... she gets claustrophobic and needs sedation before MRIs.   Past Surgical History  Procedure Date  . Translabyrinthine procedure 2002    [facility] Dr. Erroll Luna  . Total abdominal hysterectomy    . Breast lumpectomy   . Aneurysm coiling 11-10    Dr. Corliss Skains    Outpatient Encounter Prescriptions as of 09/27/2011  Medication Sig Dispense Refill  . acetaminophen (TYLENOL) 500 MG tablet Take 500 mg by mouth every 6 (six) hours as needed.        Marland Kitchen aspirin 325 MG tablet Take 325 mg by mouth daily.        . Calcium Carbonate-Vitamin D (CALTRATE 600+D) 600-400 MG-UNIT per tablet Take 1 tablet by mouth 2 (two) times daily.        . cholecalciferol (VITAMIN D) 1000 UNITS tablet Take 1,000 Units by mouth daily.        . clopidogrel (PLAVIX) 75 MG tablet Take 1 tablet (75 mg total) by mouth daily.  90 tablet  3  . clorazepate (TRANXENE) 7.5 MG tablet Take 1 tablet (7.5 mg total) by mouth 3 (three) times daily as needed.  270 tablet  3  . levothyroxine (SYNTHROID) 75 MCG tablet Take 1 tablet (75 mcg total) by mouth daily.  90 tablet  3  . pravastatin (PRAVACHOL) 40 MG tablet Take 1 tablet (40 mg total) by mouth daily.  90 tablet  3  . senna-docusate (STOOL SOFTENER & LAXATIVE) 8.6-50 MG per tablet Take 2 tablets by mouth at bedtime.        . traMADol (ULTRAM) 50 MG tablet Take 1 tablet (50 mg total) by mouth 3 (three) times daily as needed for pain.  180 tablet  3     Allergies  Allergen Reactions  . Atorvastatin  REACTION: INTOL to Lipitor w/ arm pain (3/09)  . Hydrocodone-Acetaminophen     REACTION: hallucinations  . Morphine     REACTION: hallucinations    Current Medications, Allergies, Past Medical History, Past Surgical History, Family History, and Social History were reviewed in [employer] record.   Review of Systems         See HPI - all other systems neg except as noted... The patient complains of dyspnea on exertion.  The patient denies anorexia, fever, weight loss, weight gain, vision loss, decreased hearing, hoarseness, chest pain, syncope, peripheral edema, prolonged cough, headaches, hemoptysis, abdominal pain, melena, hematochezia, severe  indigestion/heartburn, hematuria, incontinence, muscle weakness, suspicious skin lesions, transient blindness, difficulty walking, depression, unusual weight change, abnormal bleeding, enlarged lymph nodes, and angioedema.     Objective:   Physical Exam     WD, WN, 71 y/o WF in NAD... GENERAL:  Alert & oriented; pleasant & cooperative... HEENT:  [place]/AT, EOM-wnl, PERRLA, EACs-clear, TMs- some scarring on right, NOSE-clear, THROAT-clear & wnl. NECK:  Supple w/ fairROM; no JVD; normal carotid impulses w/o bruits; palp thyroid, no nodules felt; no lymphadenopathy. CHEST:  +Tender chest wall on right; Clear to P & A; without wheezes/ rales/ or rhonchi heard... HEART:  Regular Rhythm; gr1/6 SEM,  no rubs or gallops heard... ABDOMEN:  Soft & nontender; normal bowel sounds; no organomegaly or masses detected. EXT: without deformities, mild arthritic changes; no varicose veins/ venous insuffic/ or edema... +trigger points about the neck/ shoulders w/ decr ROM right shoulder w/ pain... NEURO:  CN's intact; no focal neuro deficits... gait & station are normal. DERM:  No lesions noted; no rash etc...  RADIOLOGY DATA:  Reviewed in the EPIC EMR & discussed w/ the patient...  LABORATORY DATA:  Reviewed in the EPIC EMR & discussed w/ the patient...   Assessment & Plan:   Chest wall pain>  We discussed rest, heat, Percocet & Flexeril as needed...  COPD/ Smoker>  She denies cough, notes min sputum/ etc; desperately needs to quit smokng- offered counseling, medications, etc but she declines...  Hx mild MVP>  She is asymptomatic w/o CP, palpit, dizziness, SOB, etc...  Hx right sided Acoustic Neuroma- s/p surg 2002 at [facility] DrBrowne> no known recurrence to date...  CEREBROVASC Dis w/ basilar tip aneurysm stent & coiling 11/10 by DrDeveshwar; f/u arteriogram 2/11 at [facility] & 8/11 at [facility] showed some compromise of the post circ w/ prolapse of the coils into the P1 segm;  Note: MRI 2/11 done for new gait abn  showed cbll infarct & ASA/ Plavix restarted and she did home phys therapy w/ improvement;  Repeat IR eval 8/12 w/ CTA showed oblit of the aneurysm & distal flow appears good as well- they rec repeat studies in 2 yrs.  HYPERLIPID>  On Prav40 + diet;  FLP looks OK but needs better diet & take med Qhs...  HYPOTHYROID>  On Levothy 77mcg/d now w/ TSH= 0.88 today; she is rec to continue this dose...  IBS w/ Constip>  She uses OTC laxatives as needed & we discussed Miralax/ Senakot-S/ etc...  ORTHO>  Prev right shoulder discomfort improved spont & MRI was never done; currently uses Tramadol Prn...  OSTEOPENIA/ Vit D defic>  Stable on Calcium, MVI, Vit D supplement...  ANXIETY>  Uses Tranxene Prn (she has some claustrophobia & needs sedation before MRIs- she doesn't remember the 2/11 MRI being done!).Marland KitchenMarland Kitchen   Patient's Medications  New Prescriptions   No medications on file  Previous  Medications   ACETAMINOPHEN (TYLENOL) 500 MG TABLET    Take 500 mg by mouth every 6 (six) hours as needed.     ASPIRIN 325 MG TABLET    Take 325 mg by mouth daily.     CALCIUM CARBONATE-VITAMIN D (CALTRATE 600+D) 600-400 MG-UNIT PER TABLET    Take 1 tablet by mouth 2 (two) times daily.     CHOLECALCIFEROL (VITAMIN D) 1000 UNITS TABLET    Take 1,000 Units by mouth daily.     CLOPIDOGREL (PLAVIX) 75 MG TABLET    Take 1 tablet (75 mg total) by mouth daily.   CLORAZEPATE (TRANXENE) 7.5 MG TABLET    Take 1 tablet (7.5 mg total) by mouth 3 (three) times daily as needed.   LEVOTHYROXINE (SYNTHROID) 75 MCG TABLET    Take 1 tablet (75 mcg total) by mouth daily.   PRAVASTATIN (PRAVACHOL) 40 MG TABLET    Take 1 tablet (40 mg total) by mouth daily.   SENNA-DOCUSATE (STOOL SOFTENER & LAXATIVE) 8.6-50 MG PER TABLET    Take 2 tablets by mouth at bedtime.     TRAMADOL (ULTRAM) 50 MG TABLET    Take 1 tablet (50 mg total) by mouth 3 (three) times daily as needed for pain.  Modified Medications   No medications on file  Discontinued  Medications   No medications on file

## 2011-10-09 ENCOUNTER — Encounter: Payer: Self-pay | Admitting: Pulmonary Disease

## 2011-11-10 ENCOUNTER — Telehealth: Payer: Self-pay | Admitting: Pulmonary Disease

## 2011-11-10 MED ORDER — LEVOTHYROXINE SODIUM 75 MCG PO TABS: 75.0000 ug | ORAL_TABLET | Freq: Every day | ORAL | Status: DC

## 2011-11-10 NOTE — Telephone Encounter (Signed)
Refill has been sent to the pharmacy per pts request.  

## 2011-11-30 ENCOUNTER — Other Ambulatory Visit (INDEPENDENT_AMBULATORY_CARE_PROVIDER_SITE_OTHER): Payer: Medicare Other

## 2011-11-30 ENCOUNTER — Encounter: Payer: Self-pay | Admitting: Pulmonary Disease

## 2011-11-30 ENCOUNTER — Ambulatory Visit (INDEPENDENT_AMBULATORY_CARE_PROVIDER_SITE_OTHER): Payer: Medicare Other | Admitting: Pulmonary Disease

## 2011-11-30 VITALS — BP 136/64 | HR 60 | Temp 97.0°F | Ht 62.5 in | Wt 133.0 lb

## 2011-11-30 DIAGNOSIS — D333 Benign neoplasm of cranial nerves: Secondary | ICD-10-CM

## 2011-11-30 DIAGNOSIS — I679 Cerebrovascular disease, unspecified: Secondary | ICD-10-CM | POA: Diagnosis not present

## 2011-11-30 DIAGNOSIS — IMO0001 Reserved for inherently not codable concepts without codable children: Secondary | ICD-10-CM

## 2011-11-30 DIAGNOSIS — E039 Hypothyroidism, unspecified: Secondary | ICD-10-CM | POA: Diagnosis not present

## 2011-11-30 DIAGNOSIS — I059 Rheumatic mitral valve disease, unspecified: Secondary | ICD-10-CM | POA: Diagnosis not present

## 2011-11-30 DIAGNOSIS — E559 Vitamin D deficiency, unspecified: Secondary | ICD-10-CM

## 2011-11-30 DIAGNOSIS — F17200 Nicotine dependence, unspecified, uncomplicated: Secondary | ICD-10-CM

## 2011-11-30 DIAGNOSIS — K589 Irritable bowel syndrome without diarrhea: Secondary | ICD-10-CM | POA: Diagnosis not present

## 2011-11-30 DIAGNOSIS — E785 Hyperlipidemia, unspecified: Secondary | ICD-10-CM

## 2011-11-30 DIAGNOSIS — M899 Disorder of bone, unspecified: Secondary | ICD-10-CM

## 2011-11-30 DIAGNOSIS — J449 Chronic obstructive pulmonary disease, unspecified: Secondary | ICD-10-CM

## 2011-11-30 DIAGNOSIS — M949 Disorder of cartilage, unspecified: Secondary | ICD-10-CM

## 2011-11-30 DIAGNOSIS — F411 Generalized anxiety disorder: Secondary | ICD-10-CM

## 2011-11-30 DIAGNOSIS — J4489 Other specified chronic obstructive pulmonary disease: Secondary | ICD-10-CM

## 2011-11-30 LAB — CBC WITH DIFFERENTIAL/PLATELET
Basophils Absolute: 0.1 10*3/uL (ref 0.0–0.1)
Basophils Relative: 0.7 % (ref 0.0–3.0)
Eosinophils Absolute: 0 10*3/uL (ref 0.0–0.7)
Hemoglobin: 12.9 g/dL (ref 12.0–15.0)
Lymphocytes Relative: 27.6 % (ref 12.0–46.0)
Lymphs Abs: 2.6 10*3/uL (ref 0.7–4.0)
MCHC: 32.7 g/dL (ref 30.0–36.0)
MCV: 88.4 fl (ref 78.0–100.0)
Monocytes Absolute: 0.5 10*3/uL (ref 0.1–1.0)
Neutro Abs: 6.3 10*3/uL (ref 1.4–7.7)
RDW: 13 % (ref 11.5–14.6)

## 2011-11-30 LAB — LIPID PANEL
LDL Cholesterol: 81 mg/dL (ref 0–99)
Total CHOL/HDL Ratio: 4

## 2011-11-30 LAB — BASIC METABOLIC PANEL
BUN: 13 mg/dL (ref 6–23)
CO2: 25 mEq/L (ref 19–32)
Calcium: 9.4 mg/dL (ref 8.4–10.5)
Chloride: 110 mEq/L (ref 96–112)
Creatinine, Ser: 0.6 mg/dL (ref 0.4–1.2)
GFR: 113.45 mL/min (ref >60.00)
Glucose, Bld: 78 mg/dL (ref 70–99)
Potassium: 4 mEq/L (ref 3.5–5.1)
Sodium: 142 mEq/L (ref 135–145)

## 2011-11-30 LAB — TSH: TSH: 0.71 u[IU]/mL (ref 0.35–5.50)

## 2011-11-30 LAB — HEPATIC FUNCTION PANEL
ALT: 10 U/L (ref 0–35)
Alkaline Phosphatase: 110 U/L (ref 39–117)
Bilirubin, Direct: 0.1 mg/dL (ref 0.0–0.3)
Total Bilirubin: 0.4 mg/dL (ref 0.3–1.2)
Total Protein: 7 g/dL (ref 6.0–8.3)

## 2011-11-30 NOTE — Patient Instructions (Addendum)
Today we updated your med list in our EPIC system...    Continue your current medications the same...  Today we did your follow up FASTING blood work...    We will call you w/ the results when avail...  Stay as active as possible, & PLEASE quit the last of the smoking!!!  Call for any questions...  Let's plan a routine f/u visit in 6 months.Marland KitchenMarland Kitchen

## 2011-11-30 NOTE — Progress Notes (Signed)
Subjective:    Patient ID: Ana Santos, female    DOB: [birth year], 71 y.o.   MRN: [id number]  HPI 71 y/o WF here for a follow up visit... she has multiple medical problems as reviewed below...    ~  November 19, 2009:  she developed some gait instability 2/11 & was rechecked by IR- DrDeveshwar> MRI showed right cbll lacunar infarct, & post op changes in right cerebellopontine angle, plus interval endovasc treatment of basilar tip aneurysm;  f/u arteriogram showed angiographic obliteration of the prev basilar tip aneurysm- distal basilar art stent patent, 50% stenosis right PCA... rec to restart PLAVIX + keep ASA 325mg /d, and quit smoking!!!  Her CC is aching & sore, low energy, etc- c/w her FM;  still smoking 1/4 ppd and can't/ won't quit despite everything.  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 she notes pain started soon after her second arteriogram from DrDeveshwar 2/11 (to eval the stent & coils placed in basilar tip aneurysm)...  tried Mobic, Tramadol, Tylenol- all w/o relief...  she saw DrReynolds 9/11- Neurology at [facility]- for f/u of the brain stem strokes seen on MRI, but he felt symptoms were more rheum related (they checked XRays/ CRP/ CPK- all norm) & he rec PT/ balance training/ vestib rehab but the pt declined "I'm getting my balance under control on my own & don't need therapy"...   She saw DrMorris [facility]- interventional neuroradiology- w/ another arteriogram performed 8/11 & showed satis appearance of the basilar aneurysm stent & coiling w/ some prolapse of the coils into the P1 segm w/ mild compromise of the post cerebral art (flow looks adeq & he rec continued ASA/ Plavix, quit smoking, & follow up 87yr)...   She saw DrBrowne [facility] 7/11- for f/u of her acoustic neuroma w/ f/u scan looking good- no recurrence but she's had mild infarction in right  cerebellar hemisphere after her basilar aneurysm treatment as above...  ~  April 04, 2010:  Labs last week showed norm CBC, norm BMet, norm TFTs, Sed=21, neg RA, & weakly pos ANA= 1:40 speckled... we gave her a Sterapred Dosepak (10mg  6d pack) w/ dramatic improvement in her diffuse aches/ pains (arms/ shoulders much improved)... she states that her husb had to help her get dressed x56mo due to the pain & after 2d Pred (60-50) she was able to dress herself... she has some resid/ persistant upper arm/ shoulder discomfort & restriction & we decided to continue Pred at 5mg /d for now & refer to Rheum to help sort this out (ie- doesn't sound like PMR due to norm sed)... needs yearly f/u CXR- still smokes- stable, NAD.Marland Kitchen.  ~  May 23, 2010:  She saw DrTruslow for rheum eval & he stopped her Pred, otherw no ch in meds, gave her a shot in shoulder & plans continued follow up (she is encouraged to call him for symptoms for his input & on-going care)... despite everything she continues to smoke 5-6 cig/d... mult somatic complaints on & off> using Tramadol, Tylenol...  OK 2011 Flu vaccine today.  ~  August 25, 2010:  Her CC has been her right shoulder pain & several shots from DrTruslow gave only temp relief;  she was seen by Ortho DrNorris who suspects a rotator cuff tear & wanted to image the shoulder (2 questions: can she get MRI, and if  not can she come off Plavix to do CT arthrogram?> she is reluctant to stop the Plavix, but I checked w/ IR at [facility] & her stent/ coils are MR compatible, and she had MRI Brain 2/11 after coiling done 11/10);  I called Dr Ranell Patrick regarding the MRI of her right shoulder...    Otherw she is feeling well, unfortunately still smoking 5-6 cig/d & can't vs won't quit;  no change in breathing, no CP, palpit, or cerebral ischemic symptoms;  CXR & Labs from 10/11 reviewed w/ pt;  her prev aching/ soreness/ PMR-like symptoms resolved & have not returned...   ~  December 01, 2010:  58mo ROV & she  is stable> working regularly doing alterations again & she loves it... Note: she did not bring med bottles to review... She states that DrNorris never called her back to let her know if her stent was MRI compatible (we had discovered that her coils were MR compatible & as noted she had an MRI 07/31/09 after the coils & stent were done so they are both clearly compatible); nevertheless she states her right shoulder discomfort improved markedly on it's own & she is feeling much better w/ just an occas Tramadol for pain;  She is waiting to hear from [facility] IR DrMorris regarding f/u visit due 8/12, she is doing well w/o cerebral ischemic symptoms & is tolerating her ASA/ Plavix w/o difficulty, bleeding, etc... Due for fasting blood work today> see below...  ~  June 01, 2011:  68mo ROV & she is doing well overall, just notices some minor discomfort in her right groin area "ever since the aneurysm treatment" but exam is neg- no hernia bulge, no bruit, etc... COPD> still smoking 5-6 cig/d & can't vs won't quit; not on meds, denies cough/ sputum/ hemoptysis/ SOB/ ch in DOE, etc; CXR today COPD, NAD... Cerebrovasc dis/ Aneurysm> she had f/u DrMorris IR at [facility] 8/12 w/ CTA that looked good- no resid aneurysm, distal flow appeared good & he didn't rec any changes & will f/u again in 65yrs; she is on ASA/ Plavix & is reluctant to stop the Plavix Rx since she is doing so well... Hyperlipid> on Prav40; tol well & FLPs have looked good on this med & dose... Hypothyroid> last OV her TSH was 12.6 on 32mcg/d so we incr to 75; TSH today is improved 0.88 & she feels well... DJD/ FM> she is managing quite well w/ prn Tramadol & Tylenol... Osteopenia> she had min osteopenia on BMD several yrs ago & takes Calcium, MVI, Vit D 1000u daily... Acoustic Neuroma> this was resected by DrBrowne [facility] 2002; last MRI 2/11 continued to look good- post op changes, no recurrent tumor seen; we discussed f/u MRI in 2013 here if not done by DrBrowne  in the interval... Anxiety> on Tranxene as needed...  ~  September 27, 2011:  29mo ROV & add-on for acute right chest pain under right breast> present for 1wk, started after yard work, right axillary area, sharp, worse w/ DB, worse w/ movement, no hyperesthesia in skin, no rash, +tender to deep palp; using tramadol/ Tylenol- sl improved but persists> we discussed CWP & treatment w/ smoking cessation, rest, heat, Percocet Tid prn, and Flexeril Tid prn...     We reviewed prob list, meds, xrays and labs> see below>> CXR 4/13 showed normal heart size, incr interstitial markings, NAD... Addendum: CWP resolved after 4d therapy- doing well...  ~  November 30, 2011:  74mo ROV & prev CWP from 4/13 visit  resolved back to baseline; her CC islack of energy & she is advised to quit all smoking & incr exercise program; she is fasting for yearly blood work today, stable on meds as outlined... COPD> still smoking 1/4-1/2ppd & can't vs won't quit; not on meds, denies cough/ sputum/ hemoptysis/ SOB/ ch in DOE, etc; CXR 4/13= COPD, NAD... Cerebrovasc dis/ Aneurysm> last f/u DrMorris IR at [facility] 8/12 w/ CTA that looked good- no resid aneurysm, distal flow appeared good & he didn't rec any changes & will f/u again in 29yrs; she is on ASA/ Plavix & is reluctant to stop the Plavix Rx since she is doing so well... Hyperlipid> on Prav40; tol well & FLP shows TChol 151, TG 138, HDL 42, LDL 81; continue same... Hypothyroid> last OV her TSH was 12.6 on 15mcg/d so we incr to 75; TSH today is 0.71 & stable, continue same... DJD/ FM> she is managing quite well w/ prn Tramadol & Tylenol; prev CWP resolved... Osteopenia> she had min osteopenia on BMD several yrs ago & takes Calcium, MVI, Vit D 1000u daily... Acoustic Neuroma> this was resected by DrBrowne [facility] 2002; last MRI 2/11 continued to look good- post op changes, no recurrent tumor seen; we discussed f/u MRI in 2013 here if not done by DrBrowne in the interval... Anxiety> on Tranxene as  needed...    We reviewed prob list, meds, xrays and labs> see below>> LABS 6/13:  FLP- at goals on Prav40;  Chems- wnl;  CBC- wnl;  TSH=0.71;  VitD=53...   Problem List:   COPD (ICD-496) - she has a min smoker's cough, sm amt beige sputum... ~  CXR 5/08 w/ chr changes, LLL scar, NAD. ~  CXR 7/09 showed norm hrt size, clear lungs, NAD. ~  CXR 9/10 showed NAD. ~  CXR 10/11 showed chr changes, NAD. ~  CXR 12/12 showed COPD, NAD. ~  CXR 4/13 showed normal heart size, incr interstitial markings, NAD...  CIGARETTE SMOKER (ICD-305.1) - can't vs won't quit and not interested in smoking cessation help... now she states she's decr to ~1/4 ppd but can't seem to improve from there...  MITRAL VALVE PROLAPSE (ICD-424.0) & Hx of CHEST PAIN (ICD-786.50) - she had CP in the past and a neg cath 1995...  ~  CP eval 1995 w/ cath showing min 10% luminal irreg RCA & mild MVP, norm LVF... ~  Cardiolite 1999 was neg- no ischemia, no infarct, EF=66%... ~  baseline EKG showed NSR, NSSTTWA (poor R prog V1-V3)...  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 referred to [facility] w/ Angiogram confirmation & subseq stent assisted coiling of the aneurysm 11/10... subseq f/u angiogram 2/11 showed complete angiographic obliteration of the aneurysm, & patent stent in distal basilar art (there was a new 50%stenosis in the right PCA)... on ASA 325mg /d & PLAVIX restarted 2/11 w/ new gait abn & cbll infarct on MRI. ~  she saw DrMorris [facility]- interventional neuroradiology- w/ another arteriogram performed 8/11- it showed satis appearance of the basilar aneurysm stent & coiling w/ some prolapse of the coils into the P1 segm w/ mild compromise of the post cerebral art (flow looks adeq & he rec continued ASA/ Plavix therapy & f/u 84yr). ~  She had f/u DrMorris 8/12 w/ CTA that looked good- no resid aneurysm, distal flow appeared good & he didn't rec any  changes- f/u planned 68yrs.  VENOUS INSUFFICIENCY (ICD-459.81) - she tells me that she  saw DrKrush for vein surgery and treatment ("it's covered by my husb's insurance").  HYPERLIPIDEMIA (ICD-272.4) - now on PRAVASTATIN 40mg /d & tol well... we reviewed low chol/ low fat diet. ~  chart review shows TChol ~ 200 range in the 80's and early 90's. ~  then TChol incr to ~250 range in late 90's and Lipitor started-  ~  FLP 5/08 on Lipitor 10mg /d showed TChol 164, TG 153, HDL 45, LDL 89. ~  in 2009 c/o no energy & arm discomfort she felt was from the Lip10- therefore DC'd. ~  FLP 7/09 on diet alone showed TChol 234, TG 218, HDL 40, LDL 139... rec- start Prav40. ~  FLP 3/10 on Prav40 showed TChol 182, Tg 137, HDL 47, LDL 107 ~  FLP 6/11 on Prav40 showed TChol 162, TG 230, HDL 37, LDL 88 ~  FLP 6/12 on Prav40 showed TChol 177, TG 139, HDL 44, LDL 105... Contin diet, take med every day. ~  FLP 6/13 on Prav40 showed TChol 151, TG 138, HDL 42, LDL 81   HYPOTHYROIDISM (ICD-244.9) - stable on LEVOTHYROID 26mcg/d now... hx Hashimoto's thyroiditis in 1989 w/ markedly pos antimicrosomal antibodies and transient hyperthy labs...  ~  labs 5/08 showed TSH= 0.53... rec- continue Levo100/d. ~  labs 7/09 showed TSH= 0.07... rec- decr Levothy100 to 1/2 daily. ~  labs 3/10 showed TSH= 17.71... rec> incr back to 155mcg/d. ~  labs 9/10 showed TSH= 0.21... continue same. ~  labs 6/11 showed TSH= 0.10... rec decr Levo100 to 1/2 daily. ~  labs 10/11 on Levo50 showed TSH= 1.25 (FreeT3 & FreeT4 normal) ~  Labs 6/12 on Levo50 showed TSH= 12.56 ==> we will contact pt & pharm to determine if taking Levo100-1/2tab daily every day? & adjust. ~  Labs 12/12 on Levo75 showed TSH= 0.88... Continue same. ~  Labs 6/13 on Levo75 showed TSH= 0.71... Continue same.  IRRITABLE BOWEL SYNDROME (ICD-564.1) - had colonoscopy by DrPatterson 11/06 that was WNL.Marland Kitchen. she notes some constipation & Rx w/ colase OTC...  NEPHROLITHIASIS (ICD-592.0) -  last stone passed in 1993, no known recurrence since then...  Hx of TENDINITIS, RIGHT ELBOW (ICD-727.09) Hx right shoulder pain w/ eval by DrNorris (MRI was planned butnever done & symptoms improved spont)... FIBROMYALGIA (ICD-729.1) - prev severe symptoms may have reflected a flair in her FM> but much improved on Pred Rx. ~  labs 9/10 showed Sed= 24 ~  labs 6/11 showed Sed= 30 ~  labs 10/11 showed CBC=norm, Chems=norm, TFTs=norm, Sed=21, RF=neg, ANA+1:40 speckled... ~  12/11:  pt saw DrTruslow & his note is pending- she indicates he stopped her Pred, & gave her shot in shoulder.  OSTEOPENIA (ICD-733.90) - BMD here 7/09 showed TScores -0.8 in Spine, and -1.2 in right FemNeck... rec to take Calcium, MVI, Vit D...  VITAMIN D DEFICIENCY (ICD-268.9) ~  labs 7/09 showed Vit D level = 16... rec- start Vit D 50K weekly. ~  labs 3/10 showed Vit D level = 69... rec> change to 1000 u daily. ~  labs 6/11 showed Vit D level = 55... Continue same. ~  Labs 6/13 showed Vit D level = 53... Continue same.  Hx of ACOUSTIC NEUROMA (ICD-225.1) - s/p surg for this tumor 10/02 at [facility]... no known recurrence... ~  MRI Brain 2/07 showed s/p translabyrinthine approach to right acoustic neuroma w/o recurrence, NAD... ~  MRI Brain 10/10 showed no recurrence of tumor, but 4-38mm basilar tip aneurysm detected & Rx as above. ~  MRI Brain 2/11  showed no recurrence/ post op changes, endovasc oblit of basilar aneurysm, cerebellar lacunar infarct & 50% stenosis of right PCA... PLAVIX restarted... she had review by [facility] IR & Neurology (as above)... ~  We discussed f/u MRI in 2013 if not done by drBrowne in the interim...  ANXIETY (ICD-300.00) - uses CHLORAZEPATE 7.5mg Tid... she gets claustrophobic and needs sedation before MRIs.   Past Surgical History  Procedure Date  . Translabyrinthine procedure 2002    [facility] Dr. Erroll Luna  . Total abdominal hysterectomy   . Breast lumpectomy   . Aneurysm coiling 11-10    Dr.  Corliss Skains    Outpatient Encounter Prescriptions as of 11/30/2011  Medication Sig Dispense Refill  . acetaminophen (TYLENOL) 500 MG tablet Take 500 mg by mouth every 6 (six) hours as needed.        Marland Kitchen aspirin 325 MG tablet Take 325 mg by mouth daily.        . Calcium Carbonate-Vitamin D (CALTRATE 600+D) 600-400 MG-UNIT per tablet Take 1 tablet by mouth 2 (two) times daily.        . cholecalciferol (VITAMIN D) 1000 UNITS tablet Take 1,000 Units by mouth daily.        . clopidogrel (PLAVIX) 75 MG tablet Take 1 tablet (75 mg total) by mouth daily.  90 tablet  3  . clorazepate (TRANXENE) 7.5 MG tablet Take 1 tablet (7.5 mg total) by mouth 3 (three) times daily as needed.  270 tablet  3  . levothyroxine (SYNTHROID) 75 MCG tablet Take 1 tablet (75 mcg total) by mouth daily.  90 tablet  3  . pravastatin (PRAVACHOL) 40 MG tablet Take 1 tablet (40 mg total) by mouth daily.  90 tablet  3  . senna-docusate (STOOL SOFTENER & LAXATIVE) 8.6-50 MG per tablet Take 2 tablets by mouth at bedtime.        . traMADol (ULTRAM) 50 MG tablet Take 1 tablet (50 mg total) by mouth 3 (three) times daily as needed for pain.  180 tablet  3  . cyclobenzaprine (FLEXERIL) 10 MG tablet Take 1/2 to 1 tablet by mouth three times daily as needed for muscle spasms  90 tablet  5  . oxyCODONE-acetaminophen (PERCOCET) 5-325 MG per tablet Take 1/2 to 1 tablet by mouth three times daily as needed for severe pain  50 tablet  0     Allergies  Allergen Reactions  . Atorvastatin     REACTION: INTOL to Lipitor w/ arm pain (3/09)  . Hydrocodone-Acetaminophen     REACTION: hallucinations  . Morphine     REACTION: hallucinations    Current Medications, Allergies, Past Medical History, Past Surgical History, Family History, and Social History were reviewed in [employer] record.   Review of Systems         See HPI - all other systems neg except as noted... The patient complains of dyspnea on exertion.  The  patient denies anorexia, fever, weight loss, weight gain, vision loss, decreased hearing, hoarseness, chest pain, syncope, peripheral edema, prolonged cough, headaches, hemoptysis, abdominal pain, melena, hematochezia, severe indigestion/heartburn, hematuria, incontinence, muscle weakness, suspicious skin lesions, transient blindness, difficulty walking, depression, unusual weight change, abnormal bleeding, enlarged lymph nodes, and angioedema.     Objective:   Physical Exam     WD, WN, 71 y/o WF in NAD... GENERAL:  Alert & oriented; pleasant & cooperative... HEENT:  [place]/AT, EOM-wnl, PERRLA, EACs-clear, TMs- some scarring on right, NOSE-clear, THROAT-clear & wnl. NECK:  Supple w/ fairROM; no  JVD; normal carotid impulses w/o bruits; palp thyroid, no nodules felt; no lymphadenopathy. CHEST:  Clear to P & A; without wheezes/ rales/ or rhonchi heard... HEART:  Regular Rhythm; gr1/6 SEM,  no rubs or gallops heard... ABDOMEN:  Soft & nontender; normal bowel sounds; no organomegaly or masses detected. EXT: without deformities, mild arthritic changes; no varicose veins/ venous insuffic/ or edema... +trigger points about the neck/ shoulders w/ decr ROM right shoulder w/ pain... NEURO:  CN's intact; no focal neuro deficits... gait & station are normal. DERM:  No lesions noted; no rash etc...  RADIOLOGY DATA:  Reviewed in the EPIC EMR & discussed w/ the patient...  LABORATORY DATA:  Reviewed in the EPIC EMR & discussed w/ the patient...   Assessment & Plan:   COPD/ Smoker>  She denies cough, notes min sputum/ etc; desperately needs to quit smokng- offered counseling, medications, etc but she declines...  Hx mild MVP>  She is asymptomatic w/o CP, palpit, dizziness, SOB, etc...  Hx right sided Acoustic Neuroma- s/p surg 2002 at [facility] DrBrowne> no known recurrence to date...  CEREBROVASC Dis w/ basilar tip aneurysm stent & coiling 11/10 by DrDeveshwar; f/u arteriogram 2/11 at [facility] & 8/11 at [facility] showed  some compromise of the post circ w/ prolapse of the coils into the P1 segm;  Note: MRI 2/11 done for new gait abn showed cbll infarct & ASA/ Plavix restarted and she did home phys therapy w/ improvement;  Repeat IR eval 8/12 w/ CTA showed oblit of the aneurysm & distal flow appears good as well- they rec repeat studies in 2 yrs.  HYPERLIPID>  On Prav40 + diet;  FLP looks OK but needs better diet & take med Qhs...  HYPOTHYROID>  On Levothy 58mcg/d now w/ TSH= 0.71 today; she is rec to continue this dose...  IBS w/ Constip>  She uses OTC laxatives as needed & we discussed Miralax/ Senakot-S/ etc...  ORTHO>  Prev right shoulder discomfort improved spont & MRI was never done; currently uses Tramadol Prn...  OSTEOPENIA/ Vit D defic>  Stable on Calcium, MVI, Vit D supplement...  ANXIETY>  Uses Tranxene Prn (she has some claustrophobia & needs sedation before MRIs- she doesn't remember the 2/11 MRI being done!).Marland KitchenMarland Kitchen   Patient's Medications  New Prescriptions   No medications on file  Previous Medications   ACETAMINOPHEN (TYLENOL) 500 MG TABLET    Take 500 mg by mouth every 6 (six) hours as needed.     ASPIRIN 325 MG TABLET    Take 325 mg by mouth daily.     CALCIUM CARBONATE-VITAMIN D (CALTRATE 600+D) 600-400 MG-UNIT PER TABLET    Take 1 tablet by mouth 2 (two) times daily.     CHOLECALCIFEROL (VITAMIN D) 1000 UNITS TABLET    Take 1,000 Units by mouth daily.     CLOPIDOGREL (PLAVIX) 75 MG TABLET    Take 1 tablet (75 mg total) by mouth daily.   CLORAZEPATE (TRANXENE) 7.5 MG TABLET    Take 1 tablet (7.5 mg total) by mouth 3 (three) times daily as needed.   CYCLOBENZAPRINE (FLEXERIL) 10 MG TABLET    Take 1/2 to 1 tablet by mouth three times daily as needed for muscle spasms   LEVOTHYROXINE (SYNTHROID) 75 MCG TABLET    Take 1 tablet (75 mcg total) by mouth daily.   OXYCODONE-ACETAMINOPHEN (PERCOCET) 5-325 MG PER TABLET    Take 1/2 to 1 tablet by mouth three times daily as needed for severe pain    PRAVASTATIN (  PRAVACHOL) 40 MG TABLET    Take 1 tablet (40 mg total) by mouth daily.   SENNA-DOCUSATE (STOOL SOFTENER & LAXATIVE) 8.6-50 MG PER TABLET    Take 2 tablets by mouth at bedtime.     TRAMADOL (ULTRAM) 50 MG TABLET    Take 1 tablet (50 mg total) by mouth 3 (three) times daily as needed for pain.  Modified Medications   No medications on file  Discontinued Medications   No medications on file

## 2012-03-01 ENCOUNTER — Telehealth: Payer: Self-pay | Admitting: Pulmonary Disease

## 2012-03-01 MED ORDER — TRAMADOL HCL 50 MG PO TABS: 50.0000 mg | ORAL_TABLET | Freq: Three times a day (TID) | ORAL | Status: DC | PRN

## 2012-03-01 NOTE — Telephone Encounter (Signed)
rx for the tramadol has [facility] per pts request.  Called and lmom to make the pt aware.

## 2012-03-28 ENCOUNTER — Ambulatory Visit (INDEPENDENT_AMBULATORY_CARE_PROVIDER_SITE_OTHER): Payer: Medicare Other

## 2012-03-28 DIAGNOSIS — Z23 Encounter for immunization: Secondary | ICD-10-CM

## 2012-03-29 DIAGNOSIS — Z23 Encounter for immunization: Secondary | ICD-10-CM

## 2012-04-24 ENCOUNTER — Telehealth: Payer: Self-pay | Admitting: Pulmonary Disease

## 2012-04-24 NOTE — Telephone Encounter (Signed)
ATC pt x2 - line busy.

## 2012-04-25 DIAGNOSIS — M549 Dorsalgia, unspecified: Secondary | ICD-10-CM | POA: Diagnosis not present

## 2012-04-25 NOTE — Telephone Encounter (Signed)
Spoke with pt. She is requesting sooner ov with SN for back pain Onset pain x 2 wks ago and now painful to the point she is not able to walk well I offered ov with TP for this am and she refused She states will go to [facility] this am She asked me to cancel ov with SN for Monday and this was done Nothing further needed per pt

## 2012-04-26 ENCOUNTER — Encounter: Payer: Self-pay | Admitting: Adult Health

## 2012-04-26 ENCOUNTER — Ambulatory Visit (INDEPENDENT_AMBULATORY_CARE_PROVIDER_SITE_OTHER): Payer: Medicare Other | Admitting: Adult Health

## 2012-04-26 ENCOUNTER — Telehealth: Payer: Self-pay | Admitting: Pulmonary Disease

## 2012-04-26 VITALS — BP 138/88 | HR 83 | Temp 98.4°F | Ht 62.5 in | Wt 128.8 lb

## 2012-04-26 DIAGNOSIS — S39012A Strain of muscle, fascia and tendon of lower back, initial encounter: Secondary | ICD-10-CM

## 2012-04-26 DIAGNOSIS — S335XXA Sprain of ligaments of lumbar spine, initial encounter: Secondary | ICD-10-CM | POA: Diagnosis not present

## 2012-04-26 NOTE — Patient Instructions (Addendum)
Finish Medrol dose pack  Use Flexeril 10mg  1/2-1 Three times a day  As needed  Muscle spasm  Norco As needed  Severe pain .  Alternate ice and heat to low back.  If not improving will need referral to Ortho  Please contact office for sooner follow up if symptoms do not improve or worsen or seek emergency care

## 2012-04-26 NOTE — Telephone Encounter (Signed)
Called, spoke with pt.  C/o "severe pain at the base of my spine" x 2 wks.  States she was seen at [facility] yesterday and was given pain meds and xrays were taken of her back.  States the pain meds work well but "as soon as they wear off, it hurts so bad I can barely walk."  Requesting OV today -- fine with seeing TP.  OV scheduled for today, Nov 15 at 12 pm with TP -- pt aware.

## 2012-04-26 NOTE — Progress Notes (Signed)
Subjective:    Patient ID: Ana Santos, female    DOB: [birth year], 71 y.o.   MRN: [id number]  HPI 71 y/o WF she has multiple medical problems as reviewed below...    ~  November 19, 2009:  she developed some gait instability 2/11 & was rechecked by IR- DrDeveshwar> MRI showed right cbll lacunar infarct, & post op changes in right cerebellopontine angle, plus interval endovasc treatment of basilar tip aneurysm;  f/u arteriogram showed angiographic obliteration of the prev basilar tip aneurysm- distal basilar art stent patent, 50% stenosis right PCA... rec to restart PLAVIX + keep ASA 325mg /d, and quit smoking!!!  Her CC is aching & sore, low energy, etc- c/w her FM;  still smoking 1/4 ppd and can't/ won't quit despite everything.  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 she notes pain started soon after her second arteriogram from DrDeveshwar 2/11 (to eval the stent & coils placed in basilar tip aneurysm)...  tried Mobic, Tramadol, Tylenol- all w/o relief...  she saw DrReynolds 9/11- Neurology at [facility]- for f/u of the brain stem strokes seen on MRI, but he felt symptoms were more rheum related (they checked XRays/ CRP/ CPK- all norm) & he rec PT/ balance training/ vestib rehab but the pt declined "I'm getting my balance under control on my own & don't need therapy"...   She saw DrMorris [facility]- interventional neuroradiology- w/ another arteriogram performed 8/11 & showed satis appearance of the basilar aneurysm stent & coiling w/ some prolapse of the coils into the P1 segm w/ mild compromise of the post cerebral art (flow looks adeq & he rec continued ASA/ Plavix, quit smoking, & follow up 57yr)...   She saw DrBrowne [facility] 7/11- for f/u of her acoustic neuroma w/ f/u scan looking good- no recurrence but she's had mild infarction in right cerebellar hemisphere after her  basilar aneurysm treatment as above...  ~  April 04, 2010:  Labs last week showed norm CBC, norm BMet, norm TFTs, Sed=21, neg RA, & weakly pos ANA= 1:40 speckled... we gave her a Sterapred Dosepak (10mg  6d pack) w/ dramatic improvement in her diffuse aches/ pains (arms/ shoulders much improved)... she states that her husb had to help her get dressed x81mo due to the pain & after 2d Pred (60-50) she was able to dress herself... she has some resid/ persistant upper arm/ shoulder discomfort & restriction & we decided to continue Pred at 5mg /d for now & refer to Rheum to help sort this out (ie- doesn't sound like PMR due to norm sed)... needs yearly f/u CXR- still smokes- stable, NAD.Marland Kitchen.  ~  May 23, 2010:  She saw DrTruslow for rheum eval & he stopped her Pred, otherw no ch in meds, gave her a shot in shoulder & plans continued follow up (she is encouraged to call him for symptoms for his input & on-going care)... despite everything she continues to smoke 5-6 cig/d... mult somatic complaints on & off> using Tramadol, Tylenol...  OK 2011 Flu vaccine today.  ~  August 25, 2010:  Her CC has been her right shoulder pain & several shots from DrTruslow gave only temp relief;  she was seen by Ortho DrNorris who suspects a rotator cuff tear & wanted to image the shoulder (2 questions: can she get MRI, and if not can she come off Plavix  to do CT arthrogram?> she is reluctant to stop the Plavix, but I checked w/ IR at [facility] & her stent/ coils are MR compatible, and she had MRI Brain 2/11 after coiling done 11/10);  I called Dr Ranell Patrick regarding the MRI of her right shoulder...    Otherw she is feeling well, unfortunately still smoking 5-6 cig/d & can't vs won't quit;  no change in breathing, no CP, palpit, or cerebral ischemic symptoms;  CXR & Labs from 10/11 reviewed w/ pt;  her prev aching/ soreness/ PMR-like symptoms resolved & have not returned...   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 Note: she did not bring med bottles to review... She states that DrNorris never called her back to let her know if her stent was MRI compatible (we had discovered that her coils were MR compatible & as noted she had an MRI 07/31/09 after the coils & stent were done so they are both clearly compatible); nevertheless she states her right shoulder discomfort improved markedly on it's own & she is feeling much better w/ just an occas Tramadol for pain;  She is waiting to hear from [facility] IR DrMorris regarding f/u visit due 8/12, she is doing well w/o cerebral ischemic symptoms & is tolerating her ASA/ Plavix w/o difficulty, bleeding, etc... Due for fasting blood work today> see below...  ~  June 01, 2011:  41mo ROV & she is doing well overall, just notices some minor discomfort in her right groin area "ever since the aneurysm treatment" but exam is neg- no hernia bulge, no bruit, etc... COPD> still smoking 5-6 cig/d & can't vs won't quit; not on meds, denies cough/ sputum/ hemoptysis/ SOB/ ch in DOE, etc; CXR today COPD, NAD... Cerebrovasc dis/ Aneurysm> she had f/u DrMorris IR at [facility] 8/12 w/ CTA that looked good- no resid aneurysm, distal flow appeared good & he didn't rec any changes & will f/u again in 27yrs; she is on ASA/ Plavix & is reluctant to stop the Plavix Rx since she is doing so well... Hyperlipid> on Prav40; tol well & FLPs have looked good on this med & dose... Hypothyroid> last OV her TSH was 12.6 on 68mcg/d so we incr to 75; TSH today is improved 0.88 & she feels well... DJD/ FM> she is managing quite well w/ prn Tramadol & Tylenol... Osteopenia> she had min osteopenia on BMD several yrs ago & takes Calcium, MVI, Vit D 1000u daily... Acoustic Neuroma> this was resected by DrBrowne [facility] 2002; last MRI 2/11 continued to look good- post op changes, no recurrent tumor seen; we discussed f/u MRI in 2013 here if not done by DrBrowne in the interval... Anxiety>  on Tranxene as needed...  ~  September 27, 2011:  50mo ROV & add-on for acute right chest pain under right breast> present for 1wk, started after yard work, right axillary area, sharp, worse w/ DB, worse w/ movement, no hyperesthesia in skin, no rash, +tender to deep palp; using tramadol/ Tylenol- sl improved but persists> we discussed CWP & treatment w/ smoking cessation, rest, heat, Percocet Tid prn, and Flexeril Tid prn...     We reviewed prob list, meds, xrays and labs> see below>> CXR 4/13 showed normal heart size, incr interstitial markings, NAD... Addendum: CWP resolved after 4d therapy- doing well...  ~  November 30, 2011:  32mo ROV & prev CWP from 4/13 visit resolved back to baseline; her CC  islack of energy & she is advised to quit all smoking & incr exercise program; she is fasting for yearly blood work today, stable on meds as outlined... COPD> still smoking 1/4-1/2ppd & can't vs won't quit; not on meds, denies cough/ sputum/ hemoptysis/ SOB/ ch in DOE, etc; CXR 4/13= COPD, NAD... Cerebrovasc dis/ Aneurysm> last f/u DrMorris IR at [facility] 8/12 w/ CTA that looked good- no resid aneurysm, distal flow appeared good & he didn't rec any changes & will f/u again in 76yrs; she is on ASA/ Plavix & is reluctant to stop the Plavix Rx since she is doing so well... Hyperlipid> on Prav40; tol well & FLP shows TChol 151, TG 138, HDL 42, LDL 81; continue same... Hypothyroid> last OV her TSH was 12.6 on 48mcg/d so we incr to 75; TSH today is 0.71 & stable, continue same... DJD/ FM> she is managing quite well w/ prn Tramadol & Tylenol; prev CWP resolved... Osteopenia> she had min osteopenia on BMD several yrs ago & takes Calcium, MVI, Vit D 1000u daily... Acoustic Neuroma> this was resected by DrBrowne [facility] 2002; last MRI 2/11 continued to look good- post op changes, no recurrent tumor seen; we discussed f/u MRI in 2013 here if not done by DrBrowne in the interval... Anxiety> on Tranxene as needed...    We reviewed prob  list, meds, xrays and labs> see below>> LABS 6/13:  FLP- at goals on Prav40;  Chems- wnl;  CBC- wnl;  TSH=0.71;  VitD=53...  04/26/2012 Acute OV  Complains of low back pain. Center of mid low back and around right hip/groin at times.  Seen at [facility] with severe low back pain x2 weeks. -progressively worse.  Says UA was neg. Xray shows DJD/DDD  Given norco 5-325mg  and medrol dosepak. Severe pain and spasms with any little movement.  Vicodin eases pain but comes right back as soon as it wears off.  No fever, urinary symptoms, n/v/d, bloody stools.  Ice and heat not working. OTC creams not helping.  No radicular symptoms.  No leg weakness. No known injury .         Problem List:   COPD (ICD-496) - she has a min smoker's cough, sm amt beige sputum... ~  CXR 5/08 w/ chr changes, LLL scar, NAD. ~  CXR 7/09 showed norm hrt size, clear lungs, NAD. ~  CXR 9/10 showed NAD. ~  CXR 10/11 showed chr changes, NAD. ~  CXR 12/12 showed COPD, NAD. ~  CXR 4/13 showed normal heart size, incr interstitial markings, NAD...  CIGARETTE SMOKER (ICD-305.1) - can't vs won't quit and not interested in smoking cessation help... now she states she's decr to ~1/4 ppd but can't seem to improve from there...  MITRAL VALVE PROLAPSE (ICD-424.0) & Hx of CHEST PAIN (ICD-786.50) - she had CP in the past and a neg cath 1995...  ~  CP eval 1995 w/ cath showing min 10% luminal irreg RCA & mild MVP, norm LVF... ~  Cardiolite 1999 was neg- no ischemia, no infarct, EF=66%... ~  baseline EKG showed NSR, NSSTTWA (poor R prog V1-V3)...  CEREBROVASCULAR DISEASE (ICD-437.9), & ANEURYSM (ICD-442.9) - found to have a 4-46mm basilar tip aneurysm on MRI Br 10/10... referred to [facility] w/ Angiogram confirmation & subseq stent assisted coiling of the aneurysm 11/10... subseq f/u angiogram 2/11 showed complete angiographic obliteration of the aneurysm, & patent stent in distal basilar art (there was a new 50%stenosis in the right  PCA)... on ASA 325mg /d & PLAVIX restarted 2/11 w/  new gait abn & cbll infarct on MRI. ~  she saw DrMorris [facility]- interventional neuroradiology- w/ another arteriogram performed 8/11- it showed satis appearance of the basilar aneurysm stent & coiling w/ some prolapse of the coils into the P1 segm w/ mild compromise of the post cerebral art (flow looks adeq & he rec continued ASA/ Plavix therapy & f/u 36yr). ~  She had f/u DrMorris 8/12 w/ CTA that looked good- no resid aneurysm, distal flow appeared good & he didn't rec any changes- f/u planned 5yrs.  VENOUS INSUFFICIENCY (ICD-459.81) - she tells me that she saw DrKrush for vein surgery and treatment ("it's covered by my husb's insurance").  HYPERLIPIDEMIA (ICD-272.4) - now on PRAVASTATIN 40mg /d & tol well... we reviewed low chol/ low fat diet. ~  chart review shows TChol ~ 200 range in the 80's and early 90's. ~  then TChol incr to ~250 range in late 90's and Lipitor started-  ~  FLP 5/08 on Lipitor 10mg /d showed TChol 164, TG 153, HDL 45, LDL 89. ~  in 2009 c/o no energy & arm discomfort she felt was from the Lip10- therefore DC'd. ~  FLP 7/09 on diet alone showed TChol 234, TG 218, HDL 40, LDL 139... rec- start Prav40. ~  FLP 3/10 on Prav40 showed TChol 182, Tg 137, HDL 47, LDL 107 ~  FLP 6/11 on Prav40 showed TChol 162, TG 230, HDL 37, LDL 88 ~  FLP 6/12 on Prav40 showed TChol 177, TG 139, HDL 44, LDL 105... Contin diet, take med every day. ~  FLP 6/13 on Prav40 showed TChol 151, TG 138, HDL 42, LDL 81   HYPOTHYROIDISM (ICD-244.9) - stable on LEVOTHYROID 63mcg/d now... hx Hashimoto's thyroiditis in 1989 w/ markedly pos antimicrosomal antibodies and transient hyperthy labs...  ~  labs 5/08 showed TSH= 0.53... rec- continue Levo100/d. ~  labs 7/09 showed TSH= 0.07... rec- decr Levothy100 to 1/2 daily. ~  labs 3/10 showed TSH= 17.71... rec> incr back to 113mcg/d. ~  labs 9/10 showed TSH= 0.21... continue same. ~  labs 6/11 showed TSH= 0.10... rec  decr Levo100 to 1/2 daily. ~  labs 10/11 on Levo50 showed TSH= 1.25 (FreeT3 & FreeT4 normal) ~  Labs 6/12 on Levo50 showed TSH= 12.56 ==> we will contact pt & pharm to determine if taking Levo100-1/2tab daily every day? & adjust. ~  Labs 12/12 on Levo75 showed TSH= 0.88... Continue same. ~  Labs 6/13 on Levo75 showed TSH= 0.71... Continue same.  IRRITABLE BOWEL SYNDROME (ICD-564.1) - had colonoscopy by DrPatterson 11/06 that was WNL.Marland Kitchen. she notes some constipation & Rx w/ colase OTC...  NEPHROLITHIASIS (ICD-592.0) - last stone passed in 1993, no known recurrence since then...  Hx of TENDINITIS, RIGHT ELBOW (ICD-727.09) Hx right shoulder pain w/ eval by DrNorris (MRI was planned butnever done & symptoms improved spont)... FIBROMYALGIA (ICD-729.1) - prev severe symptoms may have reflected a flair in her FM> but much improved on Pred Rx. ~  labs 9/10 showed Sed= 24 ~  labs 6/11 showed Sed= 30 ~  labs 10/11 showed CBC=norm, Chems=norm, TFTs=norm, Sed=21, RF=neg, ANA+1:40 speckled... ~  12/11:  pt saw DrTruslow & his note is pending- she indicates he stopped her Pred, & gave her shot in shoulder.  OSTEOPENIA (ICD-733.90) - BMD here 7/09 showed TScores -0.8 in Spine, and -1.2 in right FemNeck... rec to take Calcium, MVI, Vit D...  VITAMIN D DEFICIENCY (ICD-268.9) ~  labs 7/09 showed Vit D level = 16... rec- start Vit D 50K  weekly. ~  labs 3/10 showed Vit D level = 69... rec> change to 1000 u daily. ~  labs 6/11 showed Vit D level = 55... Continue same. ~  Labs 6/13 showed Vit D level = 53... Continue same.  Hx of ACOUSTIC NEUROMA (ICD-225.1) - s/p surg for this tumor 10/02 at [facility]... no known recurrence... ~  MRI Brain 2/07 showed s/p translabyrinthine approach to right acoustic neuroma w/o recurrence, NAD... ~  MRI Brain 10/10 showed no recurrence of tumor, but 4-59mm basilar tip aneurysm detected & Rx as above. ~  MRI Brain 2/11 showed no recurrence/ post op changes, endovasc oblit of  basilar aneurysm, cerebellar lacunar infarct & 50% stenosis of right PCA... PLAVIX restarted... she had review by [facility] IR & Neurology (as above)... ~  We discussed f/u MRI in 2013 if not done by drBrowne in the interim...  ANXIETY (ICD-300.00) - uses CHLORAZEPATE 7.5mg Tid... she gets claustrophobic and needs sedation before MRIs.   Past Surgical History  Procedure Date  . Translabyrinthine procedure 2002    [facility] Dr. Erroll Luna  . Total abdominal hysterectomy   . Breast lumpectomy   . Aneurysm coiling 11-10    Dr. Corliss Skains    Outpatient Encounter Prescriptions as of 04/26/2012  Medication Sig Dispense Refill  . acetaminophen (TYLENOL) 500 MG tablet Take 500 mg by mouth every 6 (six) hours as needed.        Marland Kitchen aspirin 325 MG tablet Take 325 mg by mouth daily.        . Calcium Carbonate-Vitamin D (CALTRATE 600+D) 600-400 MG-UNIT per tablet Take 1 tablet by mouth 2 (two) times daily.        . cholecalciferol (VITAMIN D) 1000 UNITS tablet Take 1,000 Units by mouth daily.        . clopidogrel (PLAVIX) 75 MG tablet Take 1 tablet (75 mg total) by mouth daily.  90 tablet  3  . clorazepate (TRANXENE) 7.5 MG tablet Take 1 tablet (7.5 mg total) by mouth 3 (three) times daily as needed.  270 tablet  3  . cyclobenzaprine (FLEXERIL) 10 MG tablet Take 1/2 to 1 tablet by mouth three times daily as needed for muscle spasms  90 tablet  5  . levothyroxine (SYNTHROID) 75 MCG tablet Take 1 tablet (75 mcg total) by mouth daily.  90 tablet  3  . oxyCODONE-acetaminophen (PERCOCET) 5-325 MG per tablet Take 1/2 to 1 tablet by mouth three times daily as needed for severe pain  50 tablet  0  . pravastatin (PRAVACHOL) 40 MG tablet Take 1 tablet (40 mg total) by mouth daily.  90 tablet  3  . senna-docusate (STOOL SOFTENER & LAXATIVE) 8.6-50 MG per tablet Take 2 tablets by mouth at bedtime.        . traMADol (ULTRAM) 50 MG tablet Take 1 tablet (50 mg total) by mouth 3 (three) times daily as needed for pain.  180 tablet  3      Allergies  Allergen Reactions  . Atorvastatin     REACTION: INTOL to Lipitor w/ arm pain (3/09)  . Hydrocodone-Acetaminophen     REACTION: hallucinations  . Morphine     REACTION: hallucinations    Current Medications, Allergies, Past Medical History, Past Surgical History, Family History, and Social History were reviewed in [employer] record.   Review of Systems       Constitutional:   No  weight loss, night sweats,  Fevers, chills, fatigue, or  lassitude.  HEENT:  No headaches,  Difficulty swallowing,  Tooth/dental problems, or  Sore throat,                No sneezing, itching, ear ache, nasal congestion, post nasal drip,   CV:  No chest pain,  Orthopnea, PND, swelling in lower extremities, anasarca, dizziness, palpitations, syncope.   GI  No heartburn, indigestion, abdominal pain, nausea, vomiting, diarrhea, change in bowel habits, loss of appetite, bloody stools.   Resp: No shortness of breath with exertion or at rest.  No excess mucus, no productive cough,  No non-productive cough,  No coughing up of blood.  No change in color of mucus.  No wheezing.  No chest wall deformity  Skin: no rash or lesions.  GU: no dysuria, change in color of urine, no urgency or frequency.  No flank pain, no hematuria   MS:  No joint   swelling.  +decreased range of motion.  + back pain.  Psych:  No change in mood or affect. No depression or anxiety.  No memory loss.       Objective:   Physical Exam     WD, WN, 71 y/o WF in NAD... GENERAL:  Alert & oriented; pleasant & cooperative... HEENT:  [place]/AT,  EACs-clear, TMs- some scarring on right, NOSE-clear, THROAT-clear & wnl. NECK:  Supple w/ fairROM; no JVD; normal carotid impulses w/o bruits; palp thyroid, no nodules felt; no lymphadenopathy. CHEST:  Clear to P & A; without wheezes/ rales/ or rhonchi heard... HEART:  Regular Rhythm; gr1/6 SEM,  no rubs or gallops heard... ABDOMEN:  Soft & nontender; normal  bowel sounds; no organomegaly or masses detected. EXT: without deformities, mild arthritic changes; no varicose veins/ venous insuffic/ or edema... Neg SLR , tender along lumbar sacral region, no deformity noted, no rash.  nml strength bilaterally/equally.  NEURO:  CN's intact; no focal neuro deficits... gait & station are normal. DERM:  No lesions noted; no rash etc...    Assessment & Plan:

## 2012-04-26 NOTE — Assessment & Plan Note (Addendum)
Acute Low back pain -suspected DJD on plain LS spine report from UC  Exam unrevealing.  UA neg per report from UC -per pt -unable to get results from UC  Advised pt is not improving w/in 2-3 days or getting worse will need further evaluation or ER   Plan  Finish Medrol dose pack  Use Flexeril 10mg  1/2-1 Three times a day  As needed  Muscle spasm  Norco As needed  Severe pain .  Alternate ice and heat to low back.  If not improving will need referral to Ortho  Please contact office for sooner follow up if symptoms do not improve or worsen or seek emergency care

## 2012-05-22 ENCOUNTER — Telehealth: Payer: Self-pay | Admitting: Pulmonary Disease

## 2012-05-22 MED ORDER — CLORAZEPATE DIPOTASSIUM 7.5 MG PO TABS: 7.5000 mg | ORAL_TABLET | Freq: Three times a day (TID) | ORAL | Status: DC | PRN

## 2012-05-22 NOTE — Telephone Encounter (Signed)
[id number] x1 

## 2012-05-22 NOTE — Telephone Encounter (Signed)
Pt notified that refill for Clorazepate was sent to [facility].  Appt scheduled with Dr Kriste Basque 07-04-12.

## 2012-05-25 ENCOUNTER — Other Ambulatory Visit: Payer: Self-pay | Admitting: Pulmonary Disease

## 2012-05-31 ENCOUNTER — Telehealth: Payer: Self-pay | Admitting: Pulmonary Disease

## 2012-05-31 ENCOUNTER — Ambulatory Visit: Payer: Medicare Other | Admitting: Pulmonary Disease

## 2012-05-31 MED ORDER — TRAMADOL HCL 50 MG PO TABS: 50.0000 mg | ORAL_TABLET | Freq: Three times a day (TID) | ORAL | Status: DC | PRN

## 2012-05-31 NOTE — Telephone Encounter (Addendum)
Called and spoke with pt and she is aware that the tramadol has been sent to her pharmacy.  Pt has a couple of concerns for SN--  1.  She has not been able to sleep the last 2-3 nights due to her legs twitching and she has to get up and walk.  This comes and goes and she stated that the next day her legs hurt so bad she can hardly walk.    2.  She started vomiting yesterday after every thing she eats.  Pt denies any abd pain, diarrhea, etc.  Pt is requesting recs. SN is out of the office today, TP please advise.  Thanks  Allergies  Allergen Reactions  . Atorvastatin     REACTION: INTOL to Lipitor w/ arm pain (3/09)  . Hydrocodone-Acetaminophen     REACTION: hallucinations  . Morphine     REACTION: hallucinations

## 2012-05-31 NOTE — Telephone Encounter (Signed)
Push fluids and rest  May have phenergan suppository 25mg  # 6  1 every 8 hr As needed  For n/v/  If not improving will need to go to ER for IV fluids  Please contact office for sooner follow up if symptoms do not improve or worsen or seek emergency care

## 2012-05-31 NOTE — Telephone Encounter (Signed)
Patient requesting refill for tramadol 50mg  1 tab 3 times per day as needed for pain.  Rx to be sent to [facility] [place]. Last OV 04/26/12, Rx last filled 03/01/12 #180 with 3 refills.  Dr. Kriste Basque is this ok to fill? Please advise, thank you

## 2012-05-31 NOTE — Telephone Encounter (Signed)
Spoke with patient, made her aware of recs as listed below per TP.  Patient appreciated recs but has declined phenergan rx. Patient states she already took something and feels her stomach is calming down. Patient verbalized understanding of other recs and nothing further needed at this time.

## 2012-06-07 DIAGNOSIS — G2581 Restless legs syndrome: Secondary | ICD-10-CM | POA: Diagnosis not present

## 2012-06-07 DIAGNOSIS — J069 Acute upper respiratory infection, unspecified: Secondary | ICD-10-CM | POA: Diagnosis not present

## 2012-06-08 ENCOUNTER — Telehealth: Payer: Self-pay | Admitting: Internal Medicine

## 2012-06-08 MED ORDER — TRAMADOL HCL 50 MG PO TABS: 50.0000 mg | ORAL_TABLET | Freq: Three times a day (TID) | ORAL | Status: DC | PRN

## 2012-06-08 NOTE — Telephone Encounter (Signed)
On call- Says she is still waiting for [facility] shipment delayed. Will send another refill to her local pharmacy [facility] [place]

## 2012-06-14 ENCOUNTER — Telehealth: Payer: Self-pay | Admitting: Pulmonary Disease

## 2012-06-14 MED ORDER — TRAMADOL HCL 50 MG PO TABS: 50.0000 mg | ORAL_TABLET | Freq: Three times a day (TID) | ORAL | Status: DC | PRN

## 2012-06-14 NOTE — Telephone Encounter (Signed)
Last refill to [facility] was 9.20.13 for tramadol 50mg  #180 w/ 3 refills > 1 tid prn pain.  Called [facility] spoke with Harrold Donath to check on the status of pt's tramadol shipment.  Per Harrold Donath, he is processing pt's refill and it ship in the next 2-3 days.  He advised that pt is to call when she needs a refill shipped.  Called spoke with patient and informed her that her tramadol will be shipped.  Pt stated that she did indeed call [facility] for her refill 4x and kept being told that it would be here "in 1 week."  Pt has needed this refilled locally twice; the last time 12.28.13 by CY in on-call phone message for #15.  Pt is requesting to have another refill to last her until her shipment comes in.  Reports she needs this for HAs.  Dr Kriste Basque please advise, thanks. Next ov 1.23.14 [facility] SN[facility] [place]

## 2012-06-14 NOTE — Telephone Encounter (Signed)
Per SN---ok to send in refill of the tramadol #50   And this has been sent to the local pharmacy.  i called and spoke with pt and she is aware. Nothing further is needed.

## 2012-07-04 ENCOUNTER — Encounter: Payer: Self-pay | Admitting: Pulmonary Disease

## 2012-07-04 ENCOUNTER — Ambulatory Visit (INDEPENDENT_AMBULATORY_CARE_PROVIDER_SITE_OTHER): Payer: Medicare Other | Admitting: Pulmonary Disease

## 2012-07-04 VITALS — BP 142/68 | HR 57 | Temp 98.0°F | Ht 62.5 in | Wt 126.6 lb

## 2012-07-04 DIAGNOSIS — F17200 Nicotine dependence, unspecified, uncomplicated: Secondary | ICD-10-CM | POA: Diagnosis not present

## 2012-07-04 DIAGNOSIS — E039 Hypothyroidism, unspecified: Secondary | ICD-10-CM

## 2012-07-04 DIAGNOSIS — J449 Chronic obstructive pulmonary disease, unspecified: Secondary | ICD-10-CM | POA: Diagnosis not present

## 2012-07-04 DIAGNOSIS — J4489 Other specified chronic obstructive pulmonary disease: Secondary | ICD-10-CM

## 2012-07-04 DIAGNOSIS — K589 Irritable bowel syndrome without diarrhea: Secondary | ICD-10-CM

## 2012-07-04 DIAGNOSIS — IMO0001 Reserved for inherently not codable concepts without codable children: Secondary | ICD-10-CM

## 2012-07-04 DIAGNOSIS — I059 Rheumatic mitral valve disease, unspecified: Secondary | ICD-10-CM

## 2012-07-04 DIAGNOSIS — D333 Benign neoplasm of cranial nerves: Secondary | ICD-10-CM

## 2012-07-04 DIAGNOSIS — I679 Cerebrovascular disease, unspecified: Secondary | ICD-10-CM

## 2012-07-04 DIAGNOSIS — I872 Venous insufficiency (chronic) (peripheral): Secondary | ICD-10-CM

## 2012-07-04 DIAGNOSIS — F411 Generalized anxiety disorder: Secondary | ICD-10-CM

## 2012-07-04 DIAGNOSIS — G2581 Restless legs syndrome: Secondary | ICD-10-CM

## 2012-07-04 DIAGNOSIS — E785 Hyperlipidemia, unspecified: Secondary | ICD-10-CM

## 2012-07-04 MED ORDER — PRAMIPEXOLE DIHYDROCHLORIDE 0.25 MG PO TABS: 0.2500 mg | ORAL_TABLET | Freq: Every day | ORAL | Status: DC

## 2012-07-04 NOTE — Progress Notes (Signed)
Subjective:    Patient ID: Ana Santos, female    DOB: 1940/08/26, 72 y.o.   MRN: [id number]  HPI 72 y/o WF here for a follow up visit... she has multiple medical problems as reviewed below...  SEE PREV EPIC NOTES FOR EARLIER DATA >>  ~  June 01, 2011:  53mo ROV & she is doing well overall, just notices some minor discomfort in her right groin area "ever since the aneurysm treatment" but exam is neg- no hernia bulge, no bruit, etc...    COPD> still smoking 5-6 cig/d & can't vs won't quit; not on meds, denies cough/ sputum/ hemoptysis/ SOB/ ch in DOE, etc; CXR today COPD, NAD...    Cerebrovasc dis/ Aneurysm> she had f/u DrMorris IR at [facility] 8/12 w/ CTA that looked good- no resid aneurysm, distal flow appeared good & he didn't rec any changes & will f/u again in 31yrs; she is on ASA/ Plavix & is reluctant to stop the Plavix Rx since she is doing so well...    Hyperlipid> on Prav40; tol well & FLPs have looked good on this med & dose...    Hypothyroid> last OV her TSH was 12.6 on 29mcg/d so we incr to 75; TSH today is improved 0.88 & she feels well...    DJD/ FM> she is managing quite well w/ prn Tramadol & Tylenol...    Osteopenia> she had min osteopenia on BMD several yrs ago & takes Calcium, MVI, Vit D 1000u daily...    Acoustic Neuroma> this was resected by DrBrowne [facility] 2002; last MRI 2/11 continued to look good- post op changes, no recurrent tumor seen; we discussed f/u MRI in 2013 here if not done by DrBrowne in the interval...    Anxiety> on Tranxene as needed...  ~  September 27, 2011:  74mo ROV & add-on for acute right chest pain under right breast> present for 1wk, started after yard work, right axillary area, sharp, worse w/ DB, worse w/ movement, no hyperesthesia in skin, no rash, +tender to deep palp; using tramadol/ Tylenol- sl improved but persists> we discussed CWP & treatment w/ smoking cessation, rest, heat, Percocet Tid prn, and Flexeril Tid prn...     We reviewed prob list, meds,  xrays and labs> see below>> CXR 4/13 showed normal heart size, incr interstitial markings, NAD... Addendum: CWP resolved after 4d therapy- doing well...  ~  November 30, 2011:  39mo ROV & prev CWP from 4/13 visit resolved back to baseline; her CC islack of energy & she is advised to quit all smoking & incr exercise program; she is fasting for yearly blood work today, stable on meds as outlined...    COPD> still smoking 1/4-1/2ppd & can't vs won't quit; not on meds, denies cough/ sputum/ hemoptysis/ SOB/ ch in DOE, etc; CXR 4/13= COPD, NAD...    Cerebrovasc dis/ Aneurysm> last f/u DrMorris IR at [facility] 8/12 w/ CTA that looked good- no resid aneurysm, distal flow appeared good & he didn't rec any changes & will f/u again in 5yrs; she is on ASA/ Plavix & is reluctant to stop the Plavix Rx since she is doing so well...    Hyperlipid> on Prav40; tol well & FLP shows TChol 151, TG 138, HDL 42, LDL 81; continue same...    Hypothyroid> last OV her TSH was 12.6 on 94mcg/d so we incr to 75; TSH today is 0.71 & stable, continue same...    DJD/ FM> she is managing quite well w/ prn Tramadol &  Tylenol; prev CWP resolved...    Osteopenia> she had min osteopenia on BMD several yrs ago & takes Calcium, MVI, Vit D 1000u daily...    Acoustic Neuroma> this was resected by DrBrowne [facility] 2002; last MRI 2/11 continued to look good- post op changes, no recurrent tumor seen; we discussed f/u MRI in 2013 here if not done by DrBrowne in the interval...    Anxiety> on Tranxene as needed...    We reviewed prob list, meds, xrays and labs> see below>> LABS 6/13:  FLP- at goals on Prav40;  Chems- wnl;  CBC- wnl;  TSH=0.71;  VitD=53...  ~  July 04, 2012:  35mo ROV & [name] was seen in Nov by [facility] & TP w/ back pain- she was tried on flexeril & Percocet but proved INTOL & has found relieve via exercise & Tramadol... She has noted some symptoms c/w RLS- intermittent but unable to rest, therefore try MIRAPEX 0.25mg  Qhs prn... Otherwise doing  well w/o new complaints or concerns... Stable on her ASA/ Plavix, taking Prav40, Synthroid75, Tranxene as needed...    We reviewed prob list, meds, xrays and labs> see below for updates >>          Problem List:   COPD (ICD-496) - she has a min smoker's cough, sm amt beige sputum... ~  CXR 5/08 w/ chr changes, LLL scar, NAD. ~  CXR 7/09 showed norm hrt size, clear lungs, NAD. ~  CXR 9/10 showed NAD. ~  CXR 10/11 showed chr changes, NAD. ~  CXR 12/12 showed COPD, NAD. ~  CXR 4/13 showed normal heart size, incr interstitial markings, NAD...  CIGARETTE SMOKER (ICD-305.1) - can't vs won't quit and not interested in smoking cessation help... now she states she's decr to ~1/4 ppd but can't seem to improve from there...  MITRAL VALVE PROLAPSE (ICD-424.0) & Hx of CHEST PAIN (ICD-786.50) - she had CP in the past and a neg cath 1995...  ~  CP eval 1995 w/ cath showing min 10% luminal irreg RCA & mild MVP, norm LVF... ~  Cardiolite 1999 was neg- no ischemia, no infarct, EF=66%... ~  baseline EKG showed NSR, NSSTTWA (poor R prog V1-V3)...  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 referred to [facility] w/ Angiogram confirmation & subseq stent assisted coiling of the aneurysm 11/10... subseq f/u angiogram 2/11 showed complete angiographic obliteration of the aneurysm, & patent stent in distal basilar art (there was a new 50%stenosis in the right PCA)... on ASA 325mg /d & PLAVIX restarted 2/11 w/ new gait abn & cbll infarct on MRI. ~  she saw DrMorris [facility]- interventional neuroradiology- w/ another arteriogram performed 8/11- it showed satis appearance of the basilar aneurysm stent & coiling w/ some prolapse of the coils into the P1 segm w/ mild compromise of the post cerebral art (flow looks adeq & he rec continued ASA/ Plavix therapy & f/u 52yr). ~  She had f/u DrMorris 8/12 w/ CTA that looked good- no resid aneurysm, distal  flow appeared good & he didn't rec any changes- f/u planned 72yrs.  VENOUS INSUFFICIENCY (ICD-459.81) - she tells me that she saw DrKrush for vein surgery and treatment ("it's covered by my husb's insurance").  HYPERLIPIDEMIA (ICD-272.4) - now on PRAVASTATIN 40mg /d & tol well... we reviewed low chol/ low fat diet. ~  chart review shows TChol ~ 200 range in the 80's and early 90's. ~  then TChol incr to ~250 range  in late 90's and Lipitor started-  ~  FLP 5/08 on Lipitor 10mg /d showed TChol 164, TG 153, HDL 45, LDL 89. ~  in 2009 c/o no energy & arm discomfort she felt was from the Lip10- therefore DC'd. ~  FLP 7/09 on diet alone showed TChol 234, TG 218, HDL 40, LDL 139... rec- start Prav40. ~  FLP 3/10 on Prav40 showed TChol 182, Tg 137, HDL 47, LDL 107 ~  FLP 6/11 on Prav40 showed TChol 162, TG 230, HDL 37, LDL 88 ~  FLP 6/12 on Prav40 showed TChol 177, TG 139, HDL 44, LDL 105... Contin diet, take med every day. ~  FLP 6/13 on Prav40 showed TChol 151, TG 138, HDL 42, LDL 81   HYPOTHYROIDISM (ICD-244.9) - stable on LEVOTHYROID 38mcg/d now... hx Hashimoto's thyroiditis in 1989 w/ markedly pos antimicrosomal antibodies and transient hyperthy labs...  ~  labs 5/08 showed TSH= 0.53... rec- continue Levo100/d. ~  labs 7/09 showed TSH= 0.07... rec- decr Levothy100 to 1/2 daily. ~  labs 3/10 showed TSH= 17.71... rec> incr back to 163mcg/d. ~  labs 9/10 showed TSH= 0.21... continue same. ~  labs 6/11 showed TSH= 0.10... rec decr Levo100 to 1/2 daily. ~  labs 10/11 on Levo50 showed TSH= 1.25 (FreeT3 & FreeT4 normal) ~  Labs 6/12 on Levo50 showed TSH= 12.56 ==> we will contact pt & pharm to determine if taking Levo100-1/2tab daily every day? & adjust. ~  Labs 12/12 on Levo75 showed TSH= 0.88... Continue same. ~  Labs 6/13 on Levo75 showed TSH= 0.71... Continue same.  IRRITABLE BOWEL SYNDROME (ICD-564.1) - had colonoscopy by DrPatterson 11/06 that was WNL.Marland Kitchen. she notes some constipation & Rx w/ colase  OTC...  NEPHROLITHIASIS (ICD-592.0) - last stone passed in 1993, no known recurrence since then...  Hx of TENDINITIS, RIGHT ELBOW (ICD-727.09) Hx right shoulder pain w/ eval by DrNorris (MRI was planned butnever done & symptoms improved spont)... FIBROMYALGIA (ICD-729.1) - prev severe symptoms may have reflected a flair in her FM> but much improved on Pred Rx. ~  labs 9/10 showed Sed= 24 ~  labs 6/11 showed Sed= 30 ~  labs 10/11 showed CBC=norm, Chems=norm, TFTs=norm, Sed=21, RF=neg, ANA+1:40 speckled... ~  12/11:  pt saw DrTruslow & his note is pending- she indicates he stopped her Pred, & gave her shot in shoulder.  OSTEOPENIA (ICD-733.90) - BMD here 7/09 showed TScores -0.8 in Spine, and -1.2 in right FemNeck... rec to take Calcium, MVI, Vit D...  VITAMIN D DEFICIENCY (ICD-268.9) ~  labs 7/09 showed Vit D level = 16... rec- start Vit D 50K weekly. ~  labs 3/10 showed Vit D level = 69... rec> change to 1000 u daily. ~  labs 6/11 showed Vit D level = 55... Continue same. ~  Labs 6/13 showed Vit D level = 53... Continue same.  Hx of ACOUSTIC NEUROMA (ICD-225.1) - s/p surg for this tumor 10/02 at [facility] [place]... no known recurrence... ~  MRI Brain 2/07 showed s/p translabyrinthine approach to right acoustic neuroma w/o recurrence, NAD... ~  MRI Brain 10/10 showed no recurrence of tumor, but 4-79mm basilar tip aneurysm detected & Rx as above. ~  MRI Brain 2/11 showed no recurrence/ post op changes, endovasc oblit of basilar aneurysm, cerebellar lacunar infarct & 50% stenosis of right PCA... PLAVIX restarted... she had review by [facility] IR & Neurology (as above)... ~  We discussed f/u MRI in 2013 if not done by DrBrowne in the interim...  RLS symptoms >> she complained  1/14 about new onset RLS symptoms- discussed trial Mirapex 0.25mg  prn...  ANXIETY (ICD-300.00) - uses CHLORAZEPATE 7.5mg Tid... she gets claustrophobic and needs sedation before MRIs.   Past Surgical History  Procedure Date  .  Translabyrinthine procedure 2002    [facility] Dr. Erroll Luna  . Total abdominal hysterectomy   . Breast lumpectomy   . Aneurysm coiling 11-10    Dr. Corliss Skains    Outpatient Encounter Prescriptions as of 07/04/2012  Medication Sig Dispense Refill  . acetaminophen (TYLENOL) 500 MG tablet Take 500 mg by mouth every 6 (six) hours as needed.        Marland Kitchen aspirin 325 MG tablet Take 325 mg by mouth daily.        . Calcium Carbonate-Vitamin D (CALTRATE 600+D) 600-400 MG-UNIT per tablet Take 1 tablet by mouth 2 (two) times daily.        . cholecalciferol (VITAMIN D) 1000 UNITS tablet Take 1,000 Units by mouth daily.        . clopidogrel (PLAVIX) 75 MG tablet TAKE 1 TABLET BY MOUTH DAILY  90 tablet  0  . clorazepate (TRANXENE) 7.5 MG tablet Take 1 tablet (7.5 mg total) by mouth 3 (three) times daily as needed.  270 tablet  3  . levothyroxine (SYNTHROID) 75 MCG tablet Take 1 tablet (75 mcg total) by mouth daily.  90 tablet  3  . pravastatin (PRAVACHOL) 40 MG tablet Take 1 tablet (40 mg total) by mouth daily.  90 tablet  3  . senna-docusate (STOOL SOFTENER & LAXATIVE) 8.6-50 MG per tablet Take 2 tablets by mouth at bedtime.        . traMADol (ULTRAM) 50 MG tablet Take 1 tablet (50 mg total) by mouth 3 (three) times daily as needed for pain.  50 tablet  0  . pramipexole (MIRAPEX) 0.25 MG tablet Take 1 tablet (0.25 mg total) by mouth at bedtime. For restless leg symptoms  30 tablet  5  . [DISCONTINUED] cyclobenzaprine (FLEXERIL) 10 MG tablet Take 1/2 to 1 tablet by mouth three times daily as needed for muscle spasms  90 tablet  5  . [DISCONTINUED] oxyCODONE-acetaminophen (PERCOCET) 5-325 MG per tablet Take 1/2 to 1 tablet by mouth three times daily as needed for severe pain  50 tablet  0    Allergies  Allergen Reactions  . Atorvastatin     REACTION: INTOL to Lipitor w/ arm pain (3/09)  . Hydrocodone-Acetaminophen     REACTION: hallucinations  . Morphine     REACTION: hallucinations    Current Medications,  Allergies, Past Medical History, Past Surgical History, Family History, and Social History were reviewed in [employer] record.   Review of Systems         See HPI - all other systems neg except as noted... The patient complains of dyspnea on exertion.  The patient denies anorexia, fever, weight loss, weight gain, vision loss, decreased hearing, hoarseness, chest pain, syncope, peripheral edema, prolonged cough, headaches, hemoptysis, abdominal pain, melena, hematochezia, severe indigestion/heartburn, hematuria, incontinence, muscle weakness, suspicious skin lesions, transient blindness, difficulty walking, depression, unusual weight change, abnormal bleeding, enlarged lymph nodes, and angioedema.     Objective:   Physical Exam     WD, WN, 72 y/o WF in NAD... GENERAL:  Alert & oriented; pleasant & cooperative... HEENT:  [place]/AT, EOM-wnl, PERRLA, EACs-clear, TMs- some scarring on right, NOSE-clear, THROAT-clear & wnl. NECK:  Supple w/ fairROM; no JVD; normal carotid impulses w/o bruits; palp thyroid, no nodules felt; no lymphadenopathy.  CHEST:  Clear to P & A; without wheezes/ rales/ or rhonchi heard... HEART:  Regular Rhythm; gr1/6 SEM,  no rubs or gallops heard... ABDOMEN:  Soft & nontender; normal bowel sounds; no organomegaly or masses detected. EXT: without deformities, mild arthritic changes; no varicose veins/ venous insuffic/ or edema... +trigger points about the neck/ shoulders w/ decr ROM right shoulder w/ pain... NEURO:  CN's intact; no focal neuro deficits... gait & station are normal. DERM:  No lesions noted; no rash etc...  RADIOLOGY DATA:  Reviewed in the EPIC EMR & discussed w/ the patient...  LABORATORY DATA:  Reviewed in the EPIC EMR & discussed w/ the patient...   Assessment & Plan:    COPD/ Smoker>  She denies cough, notes min sputum/ etc; desperately needs to quit smokng- offered counseling, medications, etc but she declines...  Hx mild MVP>   She is asymptomatic w/o CP, palpit, dizziness, SOB, etc...  Hx right sided Acoustic Neuroma- s/p surg 2002 at [facility] DrBrowne> no known recurrence to date...  CEREBROVASC Dis w/ basilar tip aneurysm stent & coiling 11/10 by DrDeveshwar; f/u arteriogram 2/11 at [facility] & 8/11 at [facility] showed some compromise of the post circ w/ prolapse of the coils into the P1 segm;  Note: MRI 2/11 done for new gait abn showed cbll infarct & ASA/ Plavix restarted and she did home phys therapy w/ improvement;  Repeat IR eval 8/12 w/ CTA showed oblit of the aneurysm & distal flow appears good as well- they rec repeat studies in 2 yrs.  HYPERLIPID>  On Prav40 + diet;  FLP looks OK but needs better diet & take med Qhs...  HYPOTHYROID>  On Levothy 69mcg/d now w/ TSH= 0.71 today; she is rec to continue this dose...  IBS w/ Constip>  She uses OTC laxatives as needed & we discussed Miralax/ Senakot-S/ etc...  ORTHO>  Prev right shoulder discomfort improved spont & MRI was never done; currently uses Tramadol Prn...  OSTEOPENIA/ Vit D defic>  Stable on Calcium, MVI, Vit D supplement...  RLS symptoms>  trial Mirapex 0.25mg  hs prn...  ANXIETY>  Uses Tranxene Prn (she has some claustrophobia & needs sedation before MRIs- she doesn't remember the 2/11 MRI being done!).Marland KitchenMarland Kitchen   Patient's Medications  New Prescriptions   PRAMIPEXOLE (MIRAPEX) 0.25 MG TABLET    Take 1 tablet (0.25 mg total) by mouth at bedtime. For restless leg symptoms  Previous Medications   ACETAMINOPHEN (TYLENOL) 500 MG TABLET    Take 500 mg by mouth every 6 (six) hours as needed.     ASPIRIN 325 MG TABLET    Take 325 mg by mouth daily.     CALCIUM CARBONATE-VITAMIN D (CALTRATE 600+D) 600-400 MG-UNIT PER TABLET    Take 1 tablet by mouth 2 (two) times daily.     CHOLECALCIFEROL (VITAMIN D) 1000 UNITS TABLET    Take 1,000 Units by mouth daily.     CLOPIDOGREL (PLAVIX) 75 MG TABLET    TAKE 1 TABLET BY MOUTH DAILY   CLORAZEPATE (TRANXENE) 7.5 MG TABLET    Take 1  tablet (7.5 mg total) by mouth 3 (three) times daily as needed.   LEVOTHYROXINE (SYNTHROID) 75 MCG TABLET    Take 1 tablet (75 mcg total) by mouth daily.   PRAVASTATIN (PRAVACHOL) 40 MG TABLET    Take 1 tablet (40 mg total) by mouth daily.   SENNA-DOCUSATE (STOOL SOFTENER & LAXATIVE) 8.6-50 MG PER TABLET    Take 2 tablets by mouth at bedtime.  TRAMADOL (ULTRAM) 50 MG TABLET    Take 1 tablet (50 mg total) by mouth 3 (three) times daily as needed for pain.  Modified Medications   No medications on file  Discontinued Medications   CYCLOBENZAPRINE (FLEXERIL) 10 MG TABLET    Take 1/2 to 1 tablet by mouth three times daily as needed for muscle spasms   OXYCODONE-ACETAMINOPHEN (PERCOCET) 5-325 MG PER TABLET    Take 1/2 to 1 tablet by mouth three times daily as needed for severe pain

## 2012-07-04 NOTE — Patient Instructions (Addendum)
Today we updated your med list in our EPIC system...    Continue your current medications the same...  We decided to add MIRAPEX 0.25mg - one tab at bedtime for the restless leg symptoms...  Call for any problems...  Let's plan a follow up visit in 6 months w/ FASTING blood work at that time.Marland KitchenMarland Kitchen

## 2012-07-16 ENCOUNTER — Other Ambulatory Visit: Payer: Self-pay | Admitting: Pulmonary Disease

## 2012-08-16 ENCOUNTER — Other Ambulatory Visit: Payer: Self-pay | Admitting: Pulmonary Disease

## 2012-09-24 ENCOUNTER — Telehealth: Payer: Self-pay | Admitting: Pulmonary Disease

## 2012-09-24 MED ORDER — CLOPIDOGREL BISULFATE 75 MG PO TABS: 75.0000 mg | ORAL_TABLET | Freq: Every day | ORAL | Status: DC

## 2012-09-24 NOTE — Telephone Encounter (Signed)
Rx has been sent in. Pt is aware. 

## 2012-12-06 ENCOUNTER — Telehealth: Payer: Self-pay | Admitting: Pulmonary Disease

## 2012-12-06 MED ORDER — TRAMADOL HCL 50 MG PO TABS: 50.0000 mg | ORAL_TABLET | Freq: Three times a day (TID) | ORAL | Status: DC | PRN

## 2012-12-06 MED ORDER — CLOPIDOGREL BISULFATE 75 MG PO TABS: 75.0000 mg | ORAL_TABLET | Freq: Every day | ORAL | Status: DC

## 2012-12-06 NOTE — Telephone Encounter (Signed)
Spoke with patient, she is aware both Rxs have been sent to [facility] Nothing further needed at this time

## 2012-12-06 NOTE — Telephone Encounter (Signed)
Patient requesting refill on plavix and tramadol. Plavix Rx sent, Dr. Kriste Basque please advise if tramadol can also be sent. Thank you!!  Last OV: 07/04/12 Next OV: 01/01/13  Current Outpatient Prescriptions on File Prior to Visit  Medication Sig Dispense Refill  . acetaminophen (TYLENOL) 500 MG tablet Take 500 mg by mouth every 6 (six) hours as needed.        Marland Kitchen aspirin 325 MG tablet Take 325 mg by mouth daily.        . Calcium Carbonate-Vitamin D (CALTRATE 600+D) 600-400 MG-UNIT per tablet Take 1 tablet by mouth 2 (two) times daily.        . cholecalciferol (VITAMIN D) 1000 UNITS tablet Take 1,000 Units by mouth daily.        . clopidogrel (PLAVIX) 75 MG tablet Take 1 tablet (75 mg total) by mouth daily.  90 tablet  0  . clorazepate (TRANXENE) 7.5 MG tablet Take 1 tablet (7.5 mg total) by mouth 3 (three) times daily as needed.  270 tablet  3  . levothyroxine (SYNTHROID, LEVOTHROID) 75 MCG tablet TAKE 1 TABLET BY MOUTH DAILY  90 tablet  1  . pramipexole (MIRAPEX) 0.25 MG tablet Take 1 tablet (0.25 mg total) by mouth at bedtime. For restless leg symptoms  30 tablet  5  . pravastatin (PRAVACHOL) 40 MG tablet TAKE 1 TABLET BY MOUTH DAILY  90 tablet  3  . senna-docusate (STOOL SOFTENER & LAXATIVE) 8.6-50 MG per tablet Take 2 tablets by mouth at bedtime.        . traMADol (ULTRAM) 50 MG tablet Take 1 tablet (50 mg total) by mouth 3 (three) times daily as needed for pain.  50 tablet  0   No current facility-administered medications on file prior to visit.

## 2012-12-06 NOTE — Telephone Encounter (Signed)
Ok to refill the tramadol.  thanks

## 2013-01-01 ENCOUNTER — Ambulatory Visit (INDEPENDENT_AMBULATORY_CARE_PROVIDER_SITE_OTHER): Payer: Medicare Other | Admitting: Pulmonary Disease

## 2013-01-01 ENCOUNTER — Other Ambulatory Visit (INDEPENDENT_AMBULATORY_CARE_PROVIDER_SITE_OTHER): Payer: Medicare Other

## 2013-01-01 ENCOUNTER — Encounter: Payer: Self-pay | Admitting: Pulmonary Disease

## 2013-01-01 VITALS — BP 128/60 | HR 64 | Temp 98.9°F | Ht 62.5 in | Wt 123.4 lb

## 2013-01-01 DIAGNOSIS — E039 Hypothyroidism, unspecified: Secondary | ICD-10-CM | POA: Diagnosis not present

## 2013-01-01 DIAGNOSIS — I679 Cerebrovascular disease, unspecified: Secondary | ICD-10-CM | POA: Diagnosis not present

## 2013-01-01 DIAGNOSIS — E559 Vitamin D deficiency, unspecified: Secondary | ICD-10-CM

## 2013-01-01 DIAGNOSIS — J449 Chronic obstructive pulmonary disease, unspecified: Secondary | ICD-10-CM

## 2013-01-01 DIAGNOSIS — I729 Aneurysm of unspecified site: Secondary | ICD-10-CM | POA: Diagnosis not present

## 2013-01-01 DIAGNOSIS — E785 Hyperlipidemia, unspecified: Secondary | ICD-10-CM

## 2013-01-01 DIAGNOSIS — I6350 Cerebral infarction due to unspecified occlusion or stenosis of unspecified cerebral artery: Secondary | ICD-10-CM | POA: Diagnosis not present

## 2013-01-01 DIAGNOSIS — F17200 Nicotine dependence, unspecified, uncomplicated: Secondary | ICD-10-CM

## 2013-01-01 DIAGNOSIS — IMO0001 Reserved for inherently not codable concepts without codable children: Secondary | ICD-10-CM

## 2013-01-01 DIAGNOSIS — G2581 Restless legs syndrome: Secondary | ICD-10-CM

## 2013-01-01 DIAGNOSIS — F411 Generalized anxiety disorder: Secondary | ICD-10-CM

## 2013-01-01 DIAGNOSIS — D333 Benign neoplasm of cranial nerves: Secondary | ICD-10-CM

## 2013-01-01 DIAGNOSIS — M899 Disorder of bone, unspecified: Secondary | ICD-10-CM

## 2013-01-01 LAB — BASIC METABOLIC PANEL
BUN: 14 mg/dL (ref 6–23)
CO2: 26 mEq/L (ref 19–32)
Calcium: 9.7 mg/dL (ref 8.4–10.5)
Creatinine, Ser: 0.7 mg/dL (ref 0.4–1.2)
Glucose, Bld: 81 mg/dL (ref 70–99)

## 2013-01-01 LAB — CBC WITH DIFFERENTIAL/PLATELET
Basophils Relative: 0.2 % (ref 0.0–3.0)
Eosinophils Relative: 1.9 % (ref 0.0–5.0)
Hemoglobin: 13 g/dL (ref 12.0–15.0)
Lymphocytes Relative: 25 % (ref 12.0–46.0)
Monocytes Relative: 4.8 % (ref 3.0–12.0)
Neutro Abs: 7.4 10*3/uL (ref 1.4–7.7)
Neutrophils Relative %: 68.1 % (ref 43.0–77.0)
RBC: 4.36 Mil/uL (ref 3.87–5.11)
WBC: 10.8 10*3/uL — ABNORMAL HIGH (ref 4.5–10.5)

## 2013-01-01 LAB — LIPID PANEL
Cholesterol: 174 mg/dL (ref 0–200)
HDL: 57.8 mg/dL (ref >39.00)
Total CHOL/HDL Ratio: 3
Triglycerides: 68 mg/dL (ref 0.0–149.0)

## 2013-01-01 LAB — HEPATIC FUNCTION PANEL
ALT: 14 U/L (ref 0–35)
Albumin: 4.4 g/dL (ref 3.5–5.2)
Bilirubin, Direct: 0.1 mg/dL (ref 0.0–0.3)
Total Protein: 7.7 g/dL (ref 6.0–8.3)

## 2013-01-01 NOTE — Patient Instructions (Addendum)
Today we updated your med list in our EPIC system...    Continue your current medications the same...  Today we did your follow up CXR & FASTING blood work... We will also sched a follow up MRI of your brain to check that right sided acoustic neuroma...    We will contact you w/ the results when available...   We will also sched your 9yr f/u visit w/ [facility]ris [facility] (Interventional radiologist) to check your aneurysm...  Please please please workon smoking cessation!!!  Call for any questions...  Let's plan a follow up visit in 75mo, sooner if needed for problems.Marland KitchenMarland Kitchen

## 2013-01-01 NOTE — Progress Notes (Signed)
Subjective:    Patient ID: Ana Santos, female    DOB: Jan 28, 1941, 72 y.o.   MRN: [id number]  HPI 72 y/o WF here for a follow up visit... she has multiple medical problems as reviewed below...  SEE PREV EPIC NOTES FOR EARLIER DATA >>  ~  June 01, 2011:  597mo ROV & she is doing well overall, just notices some minor discomfort in her right groin area "ever since the aneurysm treatment" but exam is neg- no hernia bulge, no bruit, etc...    COPD> still smoking 5-6 cig/d & can't vs won't quit; not on meds, denies cough/ sputum/ hemoptysis/ SOB/ ch in DOE, etc; CXR today COPD, NAD...    Cerebrovasc dis/ Aneurysm> she had f/u DrMorris IR at [facility] 8/12 w/ CTA that looked good- no resid aneurysm, distal flow appeared good & he didn't rec any changes & will f/u again in 21yrs; she is on ASA/ Plavix & is reluctant to stop the Plavix Rx since she is doing so well...    Hyperlipid> on Prav40; tol well & FLPs have looked good on this med & dose...    Hypothyroid> last OV her TSH was 12.6 on 54mcg/d so we incr to 75; TSH today is improved 0.88 & she feels well...    DJD/ FM> she is managing quite well w/ prn Tramadol & Tylenol...    Osteopenia> she had min osteopenia on BMD several yrs ago & takes Calcium, MVI, Vit D 1000u daily...    Acoustic Neuroma> this was resected by DrBrowne [facility] 2002; last MRI 2/11 continued to look good- post op changes, no recurrent tumor seen; we discussed f/u MRI in 2013 here if not done by DrBrowne in the interval...    Anxiety> on Tranxene as needed...  ~  September 27, 2011:  37mo ROV & add-on for acute right chest pain under right breast> present for 1wk, started after yard work, right axillary area, sharp, worse w/ DB, worse w/ movement, no hyperesthesia in skin, no rash, +tender to deep palp; using tramadol/ Tylenol- sl improved but persists> we discussed CWP & treatment w/ smoking cessation, rest, heat, Percocet Tid prn, and Flexeril Tid prn...     We reviewed prob list, meds,  xrays and labs> see below>> CXR 4/13 showed normal heart size, incr interstitial markings, NAD... Addendum: CWP resolved after 4d therapy- doing well...  ~  November 30, 2011:  97mo ROV & prev CWP from 4/13 visit resolved back to baseline; her CC islack of energy & she is advised to quit all smoking & incr exercise program; she is fasting for yearly blood work today, stable on meds as outlined...    COPD> still smoking 1/4-1/2ppd & can't vs won't quit; not on meds, denies cough/ sputum/ hemoptysis/ SOB/ ch in DOE, etc; CXR 4/13= COPD, NAD...    Cerebrovasc dis/ Aneurysm> last f/u DrMorris IR at [facility] 8/12 w/ CTA that looked good- no resid aneurysm, distal flow appeared good & he didn't rec any changes & will f/u again in 40yrs; she is on ASA/ Plavix & is reluctant to stop the Plavix Rx since she is doing so well...    Hyperlipid> on Prav40; tol well & FLP shows TChol 151, TG 138, HDL 42, LDL 81; continue same...    Hypothyroid> last OV her TSH was 12.6 on 10mcg/d so we incr to 75; TSH today is 0.71 & stable, continue same...    DJD/ FM> she is managing quite well w/ prn Tramadol &  Tylenol; prev CWP resolved...    Osteopenia> she had min osteopenia on BMD several yrs ago & takes Calcium, MVI, Vit D 1000u daily...    Acoustic Neuroma> this was resected by DrBrowne [facility] 2002; last MRI 2/11 continued to look good- post op changes, no recurrent tumor seen; we discussed f/u MRI in 2013 here if not done by DrBrowne in the interval...    Anxiety> on Tranxene as needed...    We reviewed prob list, meds, xrays and labs> see below>> LABS 6/13:  FLP- at goals on Prav40;  Chems- wnl;  CBC- wnl;  TSH=0.71;  VitD=53...  ~  July 04, 2012:  33mo ROV & Ana Santos was seen in [facility] & TP w/ back pain- she was tried on flexeril & Percocet but proved INTOL & has found relieve via exercise & Tramadol... She has noted some symptoms c/w RLS- intermittent but unable to rest, therefore try MIRAPEX 0.25mg  Qhs prn... Otherwise doing  well w/o new complaints or concerns... Stable on her ASA/ Plavix, taking Prav40, Synthroid75, Tranxene as needed...    We reviewed prob list, meds, xrays and labs> see below for updates >>   ~  January 01, 2013:  71mo ROV & Ana Santos is due for her follow up appts in [place] [facility] but hasn't heard from DrMorris or DrBrowne & we will facilitate her f/u there... She has been under incr stress caring for her husb who had open heart surg... We reviewed the following medical problems during today's office visit >>     COPD> still smoking 1/4-1/2ppd & can't vs won't quit; not on meds, denies cough/ sputum/ hemoptysis/ SOB/ ch in DOE, etc; CXR 4/13= COPD, NAD...    Cerebrovasc dis/ Aneurysm> on ASA325, Plavix75; last f/u DrMorris IR at [facility] 8/12 w/ CTA that looked good- no resid aneurysm, distal flow appeared good & he didn't rec any changes & will f/u again in 57yrs; due now & we will refer to them...    Hyperlipid> on Prav40; tol well & FLP 7/14 shows TChol 174, TG 68, HDL 58, LDL 103; continue same...    Hypothyroid> on Levothy75; labs 7/14 shows TSH= 2.47    DJD/ FM> she is managing quite well w/ prn Tramadol & Tylenol; prev CWP resolved...    Osteopenia> she had min osteopenia on BMD several yrs ago & takes Calcium, MVI, Vit D 1000u daily...    RLS> on Mirapex 0.25mg  Qhs as needed...     Acoustic Neuroma> this was resected by DrBrowne [facility] 2002; last MRI 2/11 continued to look good- post op changes, no recurrent tumor seen; we discussed f/u MRI here since it hasn't been done by DrBrowne in the interval...    Anxiety> on Tranxene as needed... We reviewed prob list, meds, xrays and labs> see below for updates >>  CXR 7/14> she forgot to go to the [facility] dept for this film...  LABS 7/14:  FLP- looks ok on Prav40;  Chems- wnl;  CBC- wnl;  TSH=2.47;  VitD=67...  ADDENDUM> DrMorris ([facility], Interventional radiology) insists that we obtain her f/u CTAngiogram & send it to him, we will also obtain f/u MRI Brain to assess her  acoustic neuroma...         Problem List:   COPD (ICD-496) - she has a min smoker's cough, sm amt beige sputum... ~  CXR 5/08 w/ chr changes, LLL scar, NAD. ~  CXR 7/09 showed norm hrt size, clear lungs, NAD. ~  CXR 9/10 showed NAD. ~  CXR 10/11 showed chr changes, NAD. ~  CXR 12/12 showed COPD, NAD. ~  CXR 4/13 showed normal heart size, incr interstitial markings, NAD...  CIGARETTE SMOKER (ICD-305.1) - can't vs won't quit and not interested in smoking cessation help... now she states she's decr to ~1/4 ppd but can't seem to improve from there...  MITRAL VALVE PROLAPSE (ICD-424.0) & Hx of CHEST PAIN (ICD-786.50) - she had CP in the past and a neg cath 1995...  ~  CP eval 1995 w/ cath showing min 10% luminal irreg RCA & mild MVP, norm LVF... ~  Cardiolite 1999 was neg- no ischemia, no infarct, EF=66%... ~  baseline EKG showed NSR, NSSTTWA (poor R prog V1-V3)...  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 referred to [facility] w/ Angiogram confirmation & subseq stent assisted coiling of the aneurysm 11/10... subseq f/u angiogram 2/11 showed complete angiographic obliteration of the aneurysm, & patent stent in distal basilar art (there was a new 50%stenosis in the right PCA)... on ASA 325mg /d & PLAVIX restarted 2/11 w/ new gait abn & cbll infarct on MRI. ~  she saw DrMorris [facility]- interventional neuroradiology- w/ another arteriogram performed 8/11- it showed satis appearance of the basilar aneurysm stent & coiling w/ some prolapse of the coils into the P1 segm w/ mild compromise of the post cerebral art (flow looks adeq & he rec continued ASA/ Plavix therapy & f/u 22yr). ~  She had f/u DrMorris 8/12 w/ CTA that looked good- no resid aneurysm, distal flow appeared good & he didn't rec any changes- f/u planned 90yrs. ~  7/14: she is due for her f/u CTA & we will call [facility] regarding a follow up appt w/  DrMorris...  VENOUS INSUFFICIENCY (ICD-459.81) - she tells me that she saw DrKrush for vein surgery and treatment ("it's covered by my husb's insurance").  HYPERLIPIDEMIA (ICD-272.4) - now on PRAVASTATIN 40mg /d & tol well... we reviewed low chol/ low fat diet. ~  chart review shows TChol ~ 200 range in the 80's and early 90's. ~  then TChol incr to ~250 range in late 90's and Lipitor started-  ~  FLP 5/08 on Lipitor 10mg /d showed TChol 164, TG 153, HDL 45, LDL 89. ~  in 2009 c/o no energy & arm discomfort she felt was from the Lip10- therefore DC'd. ~  FLP 7/09 on diet alone showed TChol 234, TG 218, HDL 40, LDL 139... rec- start Prav40. ~  FLP 3/10 on Prav40 showed TChol 182, Tg 137, HDL 47, LDL 107 ~  FLP 6/11 on Prav40 showed TChol 162, TG 230, HDL 37, LDL 88 ~  FLP 6/12 on Prav40 showed TChol 177, TG 139, HDL 44, LDL 105... Contin diet, take med every day. ~  FLP 6/13 on Prav40 showed TChol 151, TG 138, HDL 42, LDL 81  ~  FLP 7/14 on Prav40 showed TChol 174, TG 68, HDL 58, LDL 103  HYPOTHYROIDISM (ICD-244.9) - stable on LEVOTHYROID 74mcg/d now... hx Hashimoto's thyroiditis in 1989 w/ markedly pos antimicrosomal antibodies and transient hyperthy labs...  ~  labs 5/08 showed TSH= 0.53... rec- continue Levo100/d. ~  labs 7/09 showed TSH= 0.07... rec- decr Levothy100 to 1/2 daily. ~  labs 3/10 showed TSH= 17.71... rec> incr back to 162mcg/d. ~  labs 9/10 showed TSH= 0.21... continue same. ~  labs 6/11 showed TSH= 0.10... rec decr Levo100 to 1/2 daily. ~  labs 10/11 on Levo50 showed TSH= 1.25 (  FreeT3 & FreeT4 normal) ~  Labs 6/12 on Levo50 showed TSH= 12.56 ==> we will contact pt & pharm to determine if taking Levo100-1/2tab daily every day? & adjust. ~  Labs 12/12 on Levo75 showed TSH= 0.88... Continue same. ~  Labs 6/13 on Levo75 showed TSH= 0.71... Continue same. ~  Labs 7/14 on Levo75 showed TSH= 2.47  IRRITABLE BOWEL SYNDROME (ICD-564.1) - had colonoscopy by DrPatterson 11/06 that was  WNL.Marland Kitchen. she notes some constipation & Rx w/ colase/ senakot-S OTC...  NEPHROLITHIASIS (ICD-592.0) - last stone passed in 1993, no known recurrence since then...  Hx of TENDINITIS, RIGHT ELBOW (ICD-727.09) Hx right shoulder pain w/ eval by DrNorris (MRI was planned butnever done & symptoms improved spont)... FIBROMYALGIA (ICD-729.1) - prev severe symptoms may have reflected a flair in her FM> but much improved on Pred Rx. ~  labs 9/10 showed Sed= 24 ~  labs 6/11 showed Sed= 30 ~  labs 10/11 showed CBC=norm, Chems=norm, TFTs=norm, Sed=21, RF=neg, ANA+1:40 speckled... ~  12/11:  pt saw DrTruslow & his note is pending- she indicates he stopped her Pred, & gave her shot in shoulder.  OSTEOPENIA (ICD-733.90) - BMD here 7/09 showed TScores -0.8 in Spine, and -1.2 in right FemNeck... rec to take Calcium, MVI, Vit D...  VITAMIN D DEFICIENCY (ICD-268.9) ~  labs 7/09 showed Vit D level = 16... rec- start Vit D 50K weekly. ~  labs 3/10 showed Vit D level = 69... rec> change to 1000 u daily. ~  labs 6/11 showed Vit D level = 55... Continue same. ~  Labs 6/13 showed Vit D level = 53... Continue same. ~  Labs 7/14 showed Vit D level = 67  Hx of ACOUSTIC NEUROMA (ICD-225.1) - s/p surg for this tumor 10/02 at [facility] DrBrowne... no known recurrence... ~  MRI Brain 2/07 showed s/p translabyrinthine approach to right acoustic neuroma w/o recurrence, NAD... ~  MRI Brain 10/10 showed no recurrence of tumor, but 4-36mm basilar tip aneurysm detected & Rx as above. ~  MRI Brain 2/11 showed no recurrence/ post op changes, endovasc oblit of basilar aneurysm, cerebellar lacunar infarct & 50% stenosis of right PCA... PLAVIX restarted... she had review by [facility] IR & Neurology (as above)... ~  We discussed f/u MRI since DrBrowne hasn't seen her since 2011 (?he turned her over to DrMorris)...  RLS symptoms >> she complained 1/14 about new onset RLS symptoms- discussed trial Mirapex 0.25mg  prn...  ANXIETY (ICD-300.00) - uses  CHLORAZEPATE 7.5mg Tid... she gets claustrophobic and needs sedation before MRIs.   Past Surgical History  Procedure Laterality Date  . Translabyrinthine procedure  2002    [facility] Dr. Erroll Luna  . Total abdominal hysterectomy    . Breast lumpectomy    . Aneurysm coiling  11-10    Dr. Corliss Skains    Outpatient Encounter Prescriptions as of 01/01/2013  Medication Sig Dispense Refill  . acetaminophen (TYLENOL) 500 MG tablet Take 500 mg by mouth every 6 (six) hours as needed.        Marland Kitchen aspirin 325 MG tablet Take 325 mg by mouth daily.        . Calcium Carbonate-Vitamin D (CALTRATE 600+D) 600-400 MG-UNIT per tablet Take 1 tablet by mouth 2 (two) times daily.        . cholecalciferol (VITAMIN D) 1000 UNITS tablet Take 1,000 Units by mouth daily.        . clopidogrel (PLAVIX) 75 MG tablet Take 1 tablet (75 mg total) by mouth daily.  90 tablet  0  . clorazepate (TRANXENE) 7.5 MG tablet Take 1 tablet (7.5 mg total) by mouth 3 (three) times daily as needed.  270 tablet  3  . levothyroxine (SYNTHROID, LEVOTHROID) 75 MCG tablet TAKE 1 TABLET BY MOUTH DAILY  90 tablet  1  . pramipexole (MIRAPEX) 0.25 MG tablet Take 1 tablet (0.25 mg total) by mouth at bedtime. For restless leg symptoms  30 tablet  5  . pravastatin (PRAVACHOL) 40 MG tablet TAKE 1 TABLET BY MOUTH DAILY  90 tablet  3  . senna-docusate (STOOL SOFTENER & LAXATIVE) 8.6-50 MG per tablet Take 2 tablets by mouth at bedtime.        . traMADol (ULTRAM) 50 MG tablet Take 1 tablet (50 mg total) by mouth 3 (three) times daily as needed for pain.  50 tablet  0   No facility-administered encounter medications on file as of 01/01/2013.    Allergies  Allergen Reactions  . Atorvastatin     REACTION: INTOL to Lipitor w/ arm pain (3/09)  . Hydrocodone-Acetaminophen     REACTION: hallucinations  . Morphine     REACTION: hallucinations    Current Medications, Allergies, Past Medical History, Past Surgical History, Family History, and Social History were  reviewed in [employer] record.   Review of Systems         See HPI - all other systems neg except as noted... The patient complains of dyspnea on exertion.  The patient denies anorexia, fever, weight loss, weight gain, vision loss, decreased hearing, hoarseness, chest pain, syncope, peripheral edema, prolonged cough, headaches, hemoptysis, abdominal pain, melena, hematochezia, severe indigestion/heartburn, hematuria, incontinence, muscle weakness, suspicious skin lesions, transient blindness, difficulty walking, depression, unusual weight change, abnormal bleeding, enlarged lymph nodes, and angioedema.     Objective:   Physical Exam     WD, WN, 72 y/o WF in NAD... GENERAL:  Alert & oriented; pleasant & cooperative... HEENT:  [place]/AT, EOM-wnl, PERRLA, EACs-clear, TMs- some scarring on right, NOSE-clear, THROAT-clear & wnl. NECK:  Supple w/ fairROM; no JVD; normal carotid impulses w/o bruits; palp thyroid, no nodules felt; no lymphadenopathy. CHEST:  Clear to P & A; without wheezes/ rales/ or rhonchi heard... HEART:  Regular Rhythm; gr1/6 SEM,  no rubs or gallops heard... ABDOMEN:  Soft & nontender; normal bowel sounds; no organomegaly or masses detected. EXT: without deformities, mild arthritic changes; no varicose veins/ venous insuffic/ or edema... +trigger points about the neck/ shoulders w/ decr ROM right shoulder w/ pain... NEURO:  CN's intact; no focal neuro deficits... gait & station are normal. DERM:  No lesions noted; no rash etc...  RADIOLOGY DATA:  Reviewed in the EPIC EMR & discussed w/ the patient...  LABORATORY DATA:  Reviewed in the EPIC EMR & discussed w/ the patient...   Assessment & Plan:    COPD/ Smoker>  She denies cough, notes min sputum/ etc; desperately needs to quit smokng- offered counseling, medications, etc but she declines...  Hx mild MVP>  She is asymptomatic w/o CP, palpit, dizziness, SOB, etc...  Hx right sided Acoustic Neuroma-  s/p surg 2002 at [facility] DrBrowne> no known recurrence to date, & we will check f/u MRI  CEREBROVASC Dis w/ basilar tip aneurysm stent & coiling 11/10 by DrDeveshwar; f/u arteriogram 2/11 at [facility] & 8/11 at [facility] showed some compromise of the post circ w/ prolapse of the coils into the P1 segm;  Note: MRI 2/11 done for new gait abn showed cbll infarct & ASA/ Plavix restarted and she  did home phys therapy w/ improvement;  Repeat IR eval 8/12 w/ CTA showed oblit of the aneurysm & distal flow appears good as well- they rec repeat studies in 2 yrs=> due now  HYPERLIPID>  On Prav40 + diet;  FLP looks OK but needs better diet & take med Qhs...  HYPOTHYROID>  On Levothy 72mcg/d now w/ TSH= 0.71 today; she is rec to continue this dose...  IBS w/ Constip>  She uses OTC laxatives as needed & we discussed Miralax/ Senakot-S/ etc...  ORTHO>  Prev right shoulder discomfort improved spont & MRI was never done; currently uses Tramadol Prn...  OSTEOPENIA/ Vit D defic>  Stable on Calcium, MVI, Vit D supplement...  RLS symptoms>  trial Mirapex 0.25mg  hs prn...  ANXIETY>  Uses Tranxene Prn (she has some claustrophobia & needs sedation before MRIs- she doesn't remember the 2/11 MRI being done!).Marland KitchenMarland Kitchen   Patient's Medications  New Prescriptions   No medications on file  Previous Medications   ACETAMINOPHEN (TYLENOL) 500 MG TABLET    Take 500 mg by mouth every 6 (six) hours as needed.     ASPIRIN 325 MG TABLET    Take 325 mg by mouth daily.     CALCIUM CARBONATE-VITAMIN D (CALTRATE 600+D) 600-400 MG-UNIT PER TABLET    Take 1 tablet by mouth 2 (two) times daily.     CHOLECALCIFEROL (VITAMIN D) 1000 UNITS TABLET    Take 1,000 Units by mouth daily.     CLOPIDOGREL (PLAVIX) 75 MG TABLET    Take 1 tablet (75 mg total) by mouth daily.   CLORAZEPATE (TRANXENE) 7.5 MG TABLET    Take 1 tablet (7.5 mg total) by mouth 3 (three) times daily as needed.   LEVOTHYROXINE (SYNTHROID, LEVOTHROID) 75 MCG TABLET    TAKE 1 TABLET BY MOUTH  DAILY   PRAMIPEXOLE (MIRAPEX) 0.25 MG TABLET    Take 1 tablet (0.25 mg total) by mouth at bedtime. For restless leg symptoms   PRAVASTATIN (PRAVACHOL) 40 MG TABLET    TAKE 1 TABLET BY MOUTH DAILY   SENNA-DOCUSATE (STOOL SOFTENER & LAXATIVE) 8.6-50 MG PER TABLET    Take 2 tablets by mouth at bedtime.     TRAMADOL (ULTRAM) 50 MG TABLET    Take 1 tablet (50 mg total) by mouth 3 (three) times daily as needed for pain.  Modified Medications   No medications on file  Discontinued Medications   No medications on file

## 2013-01-02 ENCOUNTER — Other Ambulatory Visit: Payer: Self-pay | Admitting: Pulmonary Disease

## 2013-01-02 DIAGNOSIS — D333 Benign neoplasm of cranial nerves: Secondary | ICD-10-CM

## 2013-01-22 ENCOUNTER — Telehealth: Payer: Self-pay | Admitting: Pulmonary Disease

## 2013-01-22 NOTE — Telephone Encounter (Signed)
Called, spoke with pt.  Verified she is requesting tramadol 50 mg rx to [facility]. Last OV with SN: 01/01/13; was asked to f/u in 6 mo Pending OV with SN: 07/09/13 Tramadol 50 mg tid prn was last sent 12/06/12 # 50 x 0 to [facility].  Dr. Kriste Basque, are you ok with tramadol rx?

## 2013-01-23 ENCOUNTER — Other Ambulatory Visit: Payer: Self-pay | Admitting: *Deleted

## 2013-01-23 MED ORDER — TRAMADOL HCL 50 MG PO TABS: 50.0000 mg | ORAL_TABLET | Freq: Three times a day (TID) | ORAL | Status: DC | PRN

## 2013-01-23 NOTE — Telephone Encounter (Signed)
Per SN-ok to give refill #90 with 1 refill. Refill sent. Pt is aware. Carron Curie, CMA

## 2013-02-07 ENCOUNTER — Telehealth: Payer: Self-pay | Admitting: Pulmonary Disease

## 2013-02-07 NOTE — Telephone Encounter (Signed)
Per SN---  Needs both  Ct angio to follow up HX basilar tip aneurysm Mri brain to follow up right acoustic neuroma with surgery 2002 at [facility].  thanks

## 2013-02-07 NOTE — Telephone Encounter (Signed)
Called and spoke with Ana Santos and she is aware that the ct angio needs to be done to follow up her aneurysm.   The mri is scheduled on 9/3 at 9:15.  Nothing further is needed.

## 2013-02-07 NOTE — Telephone Encounter (Signed)
I spoke with Ana Santos. She stated the radiologists suggests if we are following up on acoustic neuroma then should have MRI done. Pt does have a history of aneurysm if that's what we are really wanting to follow up on then the CT head is okay but we need to change DX in the computer. Please advise SN thanks

## 2013-02-11 ENCOUNTER — Ambulatory Visit
Admission: RE | Admit: 2013-02-11 | Discharge: 2013-02-11 | Disposition: A | Discharge status: Home / Self Care | Payer: Medicare Other | Source: Ambulatory Visit | Attending: Pulmonary Disease | Admitting: Pulmonary Disease

## 2013-02-11 DIAGNOSIS — I658 Occlusion and stenosis of other precerebral arteries: Secondary | ICD-10-CM | POA: Diagnosis not present

## 2013-02-11 DIAGNOSIS — D333 Benign neoplasm of cranial nerves: Secondary | ICD-10-CM

## 2013-02-11 MED ORDER — IOHEXOL 350 MG/ML SOLN
100.0000 mL | Freq: Once | INTRAVENOUS | Status: AC | PRN
  Administered 2013-02-11: 100 mL via INTRAVENOUS

## 2013-02-12 ENCOUNTER — Ambulatory Visit
Admission: RE | Admit: 2013-02-12 | Discharge: 2013-02-12 | Disposition: A | Discharge status: Home / Self Care | Payer: Medicare Other | Source: Ambulatory Visit | Attending: Pulmonary Disease | Admitting: Pulmonary Disease

## 2013-02-12 DIAGNOSIS — I671 Cerebral aneurysm, nonruptured: Secondary | ICD-10-CM | POA: Diagnosis not present

## 2013-02-12 DIAGNOSIS — D333 Benign neoplasm of cranial nerves: Secondary | ICD-10-CM

## 2013-02-12 MED ORDER — GADOBENATE DIMEGLUMINE 529 MG/ML IV SOLN
10.0000 mL | Freq: Once | INTRAVENOUS | Status: AC | PRN
  Administered 2013-02-12: 10 mL via INTRAVENOUS

## 2013-02-16 ENCOUNTER — Other Ambulatory Visit: Payer: Self-pay | Admitting: Pulmonary Disease

## 2013-03-14 ENCOUNTER — Telehealth: Payer: Self-pay | Admitting: Pulmonary Disease

## 2013-03-14 MED ORDER — TRAMADOL HCL 50 MG PO TABS: 50.0000 mg | ORAL_TABLET | Freq: Three times a day (TID) | ORAL | Status: DC | PRN

## 2013-03-14 NOTE — Telephone Encounter (Signed)
Per SN---  Ok to refill the tramadol per pts request. Printed and signed by [facility] and faxed to the mail order.

## 2013-03-14 NOTE — Telephone Encounter (Signed)
Last OV 01/01/13 Pending OV 07/09/13 Last fill 01/23/13 #90 with 1 additional refill  Allergies  Allergen Reactions  . Atorvastatin     REACTION: INTOL to Lipitor w/ arm pain (3/09)  . Hydrocodone-Acetaminophen     REACTION: hallucinations  . Morphine     REACTION: hallucinations    SN - please advise on refill. Thanks!

## 2013-04-30 ENCOUNTER — Telehealth: Payer: Self-pay | Admitting: Pulmonary Disease

## 2013-04-30 MED ORDER — CLORAZEPATE DIPOTASSIUM 7.5 MG PO TABS
7.5000 mg | ORAL_TABLET | Freq: Three times a day (TID) | ORAL | Status: DC | PRN
Start: 2013-04-30 — End: 2013-08-26

## 2013-04-30 NOTE — Telephone Encounter (Signed)
rx has been signed and faxed back to the mail order pharmacy

## 2013-04-30 NOTE — Telephone Encounter (Signed)
Clorazepate 7.5mg  TID prn last refilled 12.11.13 #270 w/ 3 refills Last ov 7.23.14 with SN, follow up in 6 months >> scheduled for 1.28.15  Called spoke with patient to verify the above information and pharmacy. [facility]  Will refill #270 w/ 0 addition refills per Leigh Rx printed for SN to sign w/ copy of message Pt aware we will call once completed

## 2013-05-05 ENCOUNTER — Other Ambulatory Visit: Payer: Self-pay | Admitting: Pulmonary Disease

## 2013-05-22 ENCOUNTER — Ambulatory Visit (INDEPENDENT_AMBULATORY_CARE_PROVIDER_SITE_OTHER): Payer: Medicare Other

## 2013-05-22 DIAGNOSIS — Z23 Encounter for immunization: Secondary | ICD-10-CM | POA: Diagnosis not present

## 2013-05-26 ENCOUNTER — Other Ambulatory Visit: Payer: Self-pay | Admitting: Pulmonary Disease

## 2013-07-09 ENCOUNTER — Ambulatory Visit (INDEPENDENT_AMBULATORY_CARE_PROVIDER_SITE_OTHER)
Admission: RE | Admit: 2013-07-09 | Discharge: 2013-07-09 | Disposition: A | Discharge status: Home / Self Care | Payer: Medicare Other | Source: Ambulatory Visit | Attending: Pulmonary Disease | Admitting: Pulmonary Disease

## 2013-07-09 ENCOUNTER — Encounter: Payer: Self-pay | Admitting: Pulmonary Disease

## 2013-07-09 ENCOUNTER — Ambulatory Visit (INDEPENDENT_AMBULATORY_CARE_PROVIDER_SITE_OTHER): Payer: Medicare Other | Admitting: Pulmonary Disease

## 2013-07-09 VITALS — BP 138/70 | HR 74 | Temp 98.2°F | Ht 62.5 in | Wt 122.2 lb

## 2013-07-09 DIAGNOSIS — R002 Palpitations: Secondary | ICD-10-CM

## 2013-07-09 DIAGNOSIS — I6350 Cerebral infarction due to unspecified occlusion or stenosis of unspecified cerebral artery: Secondary | ICD-10-CM

## 2013-07-09 DIAGNOSIS — Z8679 Personal history of other diseases of the circulatory system: Secondary | ICD-10-CM

## 2013-07-09 DIAGNOSIS — M899 Disorder of bone, unspecified: Secondary | ICD-10-CM

## 2013-07-09 DIAGNOSIS — J4489 Other specified chronic obstructive pulmonary disease: Secondary | ICD-10-CM

## 2013-07-09 DIAGNOSIS — E785 Hyperlipidemia, unspecified: Secondary | ICD-10-CM

## 2013-07-09 DIAGNOSIS — F17200 Nicotine dependence, unspecified, uncomplicated: Secondary | ICD-10-CM | POA: Diagnosis not present

## 2013-07-09 DIAGNOSIS — F411 Generalized anxiety disorder: Secondary | ICD-10-CM

## 2013-07-09 DIAGNOSIS — J449 Chronic obstructive pulmonary disease, unspecified: Secondary | ICD-10-CM

## 2013-07-09 DIAGNOSIS — IMO0001 Reserved for inherently not codable concepts without codable children: Secondary | ICD-10-CM

## 2013-07-09 DIAGNOSIS — I729 Aneurysm of unspecified site: Secondary | ICD-10-CM

## 2013-07-09 DIAGNOSIS — E039 Hypothyroidism, unspecified: Secondary | ICD-10-CM

## 2013-07-09 DIAGNOSIS — D333 Benign neoplasm of cranial nerves: Secondary | ICD-10-CM

## 2013-07-09 DIAGNOSIS — I059 Rheumatic mitral valve disease, unspecified: Secondary | ICD-10-CM

## 2013-07-09 DIAGNOSIS — G2581 Restless legs syndrome: Secondary | ICD-10-CM

## 2013-07-09 DIAGNOSIS — I679 Cerebrovascular disease, unspecified: Secondary | ICD-10-CM

## 2013-07-09 DIAGNOSIS — M949 Disorder of cartilage, unspecified: Secondary | ICD-10-CM

## 2013-07-09 DIAGNOSIS — E559 Vitamin D deficiency, unspecified: Secondary | ICD-10-CM

## 2013-07-09 MED ORDER — TOPIRAMATE 25 MG PO TABS: ORAL_TABLET | ORAL | Status: DC

## 2013-07-09 NOTE — Progress Notes (Signed)
Subjective:    Patient ID: Ana Santos, female    DOB: 1940/11/16, 73 y.o.   MRN: [id number]  HPI 73 y/o WF here for a follow up visit... she has multiple medical problems as reviewed below...  SEE PREV EPIC NOTES FOR EARLIER DATA >>  ~  September 27, 2011:  29mo ROV & add-on for acute right chest pain under right breast> present for 1wk, started after yard work, right axillary area, sharp, worse w/ DB, worse w/ movement, no hyperesthesia in skin, no rash, +tender to deep palp; using tramadol/ Tylenol- sl improved but persists> we discussed CWP & treatment w/ smoking cessation, rest, heat, Percocet Tid prn, and Flexeril Tid prn...     We reviewed prob list, meds, xrays and labs> see below>> CXR 4/13 showed normal heart size, incr interstitial markings, NAD... Addendum: CWP resolved after 4d therapy- doing well...  ~  November 30, 2011:  56mo ROV & prev CWP from 4/13 visit resolved back to baseline; her CC islack of energy & she is advised to quit all smoking & incr exercise program; she is fasting for yearly blood work today, stable on meds as outlined...    COPD> still smoking 1/4-1/2ppd & can't vs won't quit; not on meds, denies cough/ sputum/ hemoptysis/ SOB/ ch in DOE, etc; CXR 4/13= COPD, NAD...    Cerebrovasc dis/ Aneurysm> last f/u DrMorris IR at [facility] 8/12 w/ CTA that looked good- no resid aneurysm, distal flow appeared good & he didn't rec any changes & will f/u again in 43yrs; she is on ASA/ Plavix & is reluctant to stop the Plavix Rx since she is doing so well...    Hyperlipid> on Prav40; tol well & FLP shows TChol 151, TG 138, HDL 42, LDL 81; continue same...    Hypothyroid> last OV her TSH was 12.6 on 64mcg/d so we incr to 75; TSH today is 0.71 & stable, continue same...    DJD/ FM> she is managing quite well w/ prn Tramadol & Tylenol; prev CWP resolved...    Osteopenia> she had min osteopenia on BMD several yrs ago & takes Calcium, MVI, Vit D 1000u daily...    Acoustic Neuroma> this was  resected by DrBrowne [facility] 2002; last MRI 2/11 continued to look good- post op changes, no recurrent tumor seen; we discussed f/u MRI in 2013 here if not done by DrBrowne in the interval...    Anxiety> on Tranxene as needed...    We reviewed prob list, meds, xrays and labs> see below>>  LABS 6/13:  FLP- at goals on Prav40;  Chems- wnl;  CBC- wnl;  TSH=0.71;  VitD=53...  ~  July 04, 2012:  34mo ROV & Ana Santos was seen in Nov by [place] w/ back pain- she was tried on flexeril & Percocet but proved INTOL & has found relieve via exercise & Tramadol... She has noted some symptoms c/w RLS- intermittent but unable to rest, therefore try MIRAPEX 0.25mg  Qhs prn... Otherwise doing well w/o new complaints or concerns... Stable on her ASA/ Plavix, taking Prav40, Synthroid75, Tranxene as needed...    We reviewed prob list, meds, xrays and labs> see below for updates >>   ~  January 01, 2013:  97mo ROV & Ana Santos is due for her follow up appts in [place] [facility] but hasn't heard from DrMorris or DrBrowne & we will facilitate her f/u there... She has been under incr stress caring for her husb who had open heart surg... We reviewed the following medical  problems during today's office visit >>     COPD> still smoking 1/4-1/2ppd & can't vs won't quit; not on meds, denies cough/ sputum/ hemoptysis/ SOB/ ch in DOE, etc; CXR 4/13= COPD, NAD...    Cerebrovasc dis/ Aneurysm> on ASA325, Plavix75; last f/u DrMorris IR at [facility] 8/12 w/ CTA that looked good- no resid aneurysm, distal flow appeared good & he didn't rec any changes & will f/u again in 72yrs; due now & we will refer to them...    Hyperlipid> on Prav40; tol well & FLP 7/14 shows TChol 174, TG 68, HDL 58, LDL 103; continue same...    Hypothyroid> on Levothy75; labs 7/14 shows TSH= 2.47    DJD/ FM> she is managing quite well w/ prn Tramadol & Tylenol; prev CWP resolved...    Osteopenia> she had min osteopenia on BMD several yrs ago & takes Calcium, MVI, Vit D 1000u daily...     RLS> on Mirapex 0.25mg  Qhs as needed...     Acoustic Neuroma> this was resected by DrBrowne [facility] 2002; last MRI 2/11 continued to look good- post op changes, no recurrent tumor seen; we discussed f/u MRI here since it hasn't been done by DrBrowne in the interval...    Anxiety> on Tranxene as needed... We reviewed prob list, meds, xrays and labs> see below for updates >>   CXR 7/14> she forgot to go to the [place] for this film...   LABS 7/14:  FLP- looks ok on Prav40;  Chems- wnl;  CBC- wnl;  TSH=2.47;  VitD=67... ADDENDUM> DrMorris ([facility], Interventional radiology) insists that we obtain her f/u CTAngiogram & send it to him, we will also obtain f/u MRI Brain to assess her acoustic neuroma...  CTA Head 9/14 showed s/p stent assisted coiling of basilar tip aneurysm, no aneurysm recurrence, patent basilar art & left post cerebral art, mild atherosclerotic calcif in cavernous carotids w/o stenosis, post op right mastoid changes w/o recurrent tumor  MRI Brain 9/14 showed no evid of resid or recurrent vestib schwannoma on the right, post surg changes w/ scarring in the int auditory canal, ols sm vessel cerebellar infarctions are stable, mild atrophy & sm vessel dis, prev coiled basilar tip aneurysm, no acute insults...  ADDENDUM> Letter from DrMorris 9/14 after review of the CTA indicates that everything is stable and flow is satisfactory (he is leaving [place] can assist in the future if needed).  ~  July 09, 2013:  20mo ROV & Ana Santos is c/o 1wk hx palpit, skipping, & weak feeling; this occurs several times a day, lasts 30+minutes, but not assoc w/ CP/ dizzy/ syncope; she is still smoking ~1/2ppd & drinks 1-2 cups of coffee; we discussed the need for further eval w/ CXR, EKG, 2DEcho & Holter monitor; her husb sees DrMcLean for Cards... Her other CC today is chronic daily HAs- they are frontal & generalized, likely mixed musc contraction/ migraines; she has Tramadol & takes this Tid, she may add  Tylenol as well, & we discussed trial of Topamax25mg - 1=>2 daily and we will refer to Neuro if not responding... We reviewed the following medical problems during today's office visit >>     COPD> still smoking 1/2ppd & can't vs won't quit; not on meds, denies cough/ sputum/ hemoptysis/ SOB/ ch in DOE, etc; CXR today 1/15= COPD, NAD; she MUST quit all smoking!    Palpit> new complaint 1/15> w/u in progress & asked to STOP SMOKING & avoid caffeine etc...     Cerebrovasc dis/ Aneurysm>  on ASA325, Plavix75; last f/u DrMorris IR at [facility] 8/12 w/ CTA that looked good- no resid aneurysm, distal flow appeared good & he didn't rec any changes; he requested that we do her f/u CTA & MRI Brain here in [place] 9/14=> good flow thru the stented basilar art, no recurrent aneurysm, no recurrent acoustic neuroma...     Hyperlipid> on Prav40; tol well & FLP 7/14 shows TChol 174, TG 68, HDL 58, LDL 103; continue same...    Hypothyroid> on Levothy75; labs 7/14 shows TSH= 2.47    DJD/ FM> she is managing quite well w/ prn Tramadol & Tylenol; prev CWP resolved...    Osteopenia> she had min osteopenia on BMD several yrs ago & takes Calcium, MVI, Vit D 1000u daily; due for f/u BMD- pending...    RLS> on Mirapex 0.25mg  Qhs as needed...     Acoustic Neuroma> this was resected by DrBrowne [facility] 2002; last MRI 2/11 continued to look good- post op changes, no recurrent tumor seen; we discussed f/u MRI here, done 9/14 and continues to look good- no evid for recurrent or resid tumor...    Anxiety> on Tranxene as needed... We reviewed prob list, meds, xrays and labs> see below for updates >> she had the 2014 Flu vaccine 12/14...   CXR 1/15 showed norm heart size, COPD/Emphysema w/ scarring in lingula, NAD.Marland KitchenMarland Kitchen  EKG 1/15 showed NSR, rate60, poor R prog V1-3, NSSTTWA, NAD...  2DEcho 2/15 showed normal LV size & function w/ EF=55-60%, no regional wall motion abnormalities, Gr1DD, norm valves and RV...  Holter Monitor 2/15 showed NSR,  PACs, PVCs, and no pathologic arrhythmias (the skips correspond to her subjective SOB/palpit)...           Problem List:   COPD (ICD-496) - she has a min smoker's cough, sm amt beige sputum... ~  CXR 5/08 w/ chr changes, LLL scar, NAD. ~  CXR 7/09 showed norm hrt size, clear lungs, NAD. ~  CXR 9/10 showed NAD. ~  CXR 10/11 showed chr changes, NAD. ~  CXR 12/12 showed COPD, NAD. ~  CXR 4/13 showed normal heart size, incr interstitial markings, NAD. ~  CXR 1/15 showed norm heart size, COPD/Emphysema w/ scarring in lingula, NAD.  CIGARETTE SMOKER (ICD-305.1) - can't vs won't quit and not interested in smoking cessation help... now she states she's decr to ~1/4-1/2 ppd but can't seem to improve from there... ~  She understands the critical need to quit smoking from the COPD/Pulm, Cardiac, & Vascular/Stroke standpoints;  She declines offers for smoking cessation help, chantix, nicotine replacement, etc...  ?MITRAL VALVE PROLAPSE (ICD-424.0) & Hx of CHEST PAIN (ICD-786.50) - she had CP in the past and a neg cath 1995...  ~  CP eval 1995 w/ cath showing min 10% luminal irreg RCA & mild MVP, norm LVF... ~  [place] was neg- no ischemia, no infarct, EF=66%... ~  baseline EKG showed NSR, NSSTTWA (poor R prog V1-V3)... ~  EKG 1/15 showed NSR, rate60, poor R prog V1-3, NSSTTWA, NAD... ~  2DEcho 2/15 showed normal LV size & function w/ EF=55-60%, no regional wall motion abnormalities, Gr1DD, norm valves and RV  PALPITATIONS >> new complaint 1/15> asked to STOP SMOKING & avoid caffeine etc. ~  EKG 1/15 showed NSR, rate60, poor R prog V1-3, NSSTTWA, NAD... ~  2DEcho 2/15 showed normal LV size & function w/ EF=55-60%, no regional wall motion abnormalities, Gr1DD, norm valves and RV... ~  Holter Monitor 2/15 showed NSR, PACs, PVCs, and no  pathologic arrhythmias (the skips correspond to her subjective SOB/palpit)...   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 referred to [facility] w/ Angiogram confirmation & subseq stent assisted coiling of the aneurysm 11/10... subseq f/u angiogram 2/11 showed complete angiographic obliteration of the aneurysm, & patent stent in distal basilar art (there was a new 50%stenosis in the right PCA)... on ASA 325mg /d & PLAVIX restarted 2/11 w/ new gait abn & cbll infarct on MRI. ~  she saw DrMorris [facility]- interventional neuroradiology- w/ another arteriogram performed 8/11- it showed satis appearance of the basilar aneurysm stent & coiling w/ some prolapse of the coils into the P1 segm w/ mild compromise of the post cerebral art (flow looks adeq & he rec continued ASA/ Plavix therapy & f/u 72yr). ~  She had f/u DrMorris 8/12 w/ CTA that looked good- no resid aneurysm, distal flow appeared good & he didn't rec any changes- f/u planned 52yrs. ~  7/14: she is due for her f/u CTA & we will call [facility] regarding a follow up appt w/ DrMorris.. ~  9/14:  on [id number], Plavix75; last f/u DrMorris IR at [facility] 8/12 w/ CTA that looked good- no resid aneurysm, distal flow appeared good & he didn't rec any changes; he requested that we do her f/u CTA & MRI Brain here in [place] 9/14=> good flow thru the stented basilar art, no recurrent aneurysm, no recurrent acoustic neuroma...  ~  CTA Head 9/14 showed s/p stent assisted coiling of basilar tip aneurysm, no aneurysm recurrence, patent basilar art & left post cerebral art, mild atherosclerotic calcif in cavernous carotids w/o stenosis, post op right mastoid changes w/o recurrent tumor.  VENOUS INSUFFICIENCY (ICD-459.81) - she tells me that she saw DrKrush for vein surgery and treatment ("it's covered by my husb's insurance").  HYPERLIPIDEMIA (ICD-272.4) - now on PRAVASTATIN 40mg /d & tol well... we reviewed low chol/ low fat diet. ~  chart review shows TChol ~ 200 range in the 80's and early 90's. ~  then TChol incr to ~250 range in late 90's and Lipitor started-   ~  FLP 5/08 on Lipitor 10mg /d showed TChol 164, TG 153, HDL 45, LDL 89. ~  in 2009 c/o no energy & arm discomfort she felt was from the Lip10- therefore [place]. ~  [place] 7/09 on diet alone showed TChol 234, TG 218, HDL 40, LDL 139... rec- start Prav40. ~  FLP 3/10 on Prav40 showed TChol 182, Tg 137, HDL 47, LDL 107 ~  FLP 6/11 on Prav40 showed TChol 162, TG 230, HDL 37, LDL 88 ~  FLP 6/12 on Prav40 showed TChol 177, TG 139, HDL 44, LDL 105... Contin diet, take med every day. ~  FLP 6/13 on Prav40 showed TChol 151, TG 138, HDL 42, LDL 81  ~  FLP 7/14 on Prav40 showed TChol 174, TG 68, HDL 58, LDL 103  HYPOTHYROIDISM (ICD-244.9) - stable on LEVOTHYROID 98mcg/d now... hx Hashimoto's thyroiditis in 1989 w/ markedly pos antimicrosomal antibodies and transient hyperthy labs...  ~  labs 5/08 showed TSH= 0.53... rec- continue Levo100/d. ~  labs 7/09 showed TSH= 0.07... rec- decr Levothy100 to 1/2 daily. ~  labs 3/10 showed TSH= 17.71... rec> incr back to 121mcg/d. ~  labs 9/10 showed TSH= 0.21... continue same. ~  labs 6/11 showed TSH= 0.10... rec decr Levo100 to 1/2 daily. ~  labs 10/11 on Levo50 showed TSH= 1.25 (FreeT3 & FreeT4 normal) ~  Labs 6/12 on Levo50 showed TSH= 12.56 ==> we will contact pt & pharm to determine if taking Levo100-1/2tab daily every day? & adjust. ~  Labs 12/12 on Levo75 showed TSH= 0.88... Continue same. ~  Labs 6/13 on Levo75 showed TSH= 0.71... Continue same. ~  Labs 7/14 on Levo75 showed TSH= 2.47  IRRITABLE BOWEL SYNDROME (ICD-564.1) - had colonoscopy by DrPatterson 11/06 that was WNL.Marland Kitchen. she notes some constipation & Rx w/ colase/ senakot-S OTC...  NEPHROLITHIASIS (ICD-592.0) - last stone passed in 1993, no known recurrence since then...  Hx of TENDINITIS, RIGHT ELBOW (ICD-727.09) Hx right shoulder pain w/ eval by DrNorris (MRI was planned butnever done & symptoms improved spont)... FIBROMYALGIA (ICD-729.1) - prev severe symptoms may have reflected a flair in her FM>  but much improved on Pred Rx. ~  labs 9/10 showed Sed= 24 ~  labs 6/11 showed Sed= 30 ~  labs 10/11 showed CBC=norm, Chems=norm, TFTs=norm, Sed=21, RF=neg, ANA+1:40 speckled... ~  12/11:  pt saw DrTruslow & his note is pending- she indicates he stopped her Pred, & gave her shot in shoulder.  OSTEOPENIA (ICD-733.90) - BMD here 7/09 showed TScores -0.8 in Spine, and -1.2 in right FemNeck... rec to take Calcium, MVI, Vit D...  VITAMIN D DEFICIENCY (ICD-268.9) ~  labs 7/09 showed Vit D level = 16... rec- start Vit D 50K weekly. ~  labs 3/10 showed Vit D level = 69... rec> change to 1000 u daily. ~  labs 6/11 showed Vit D level = 55... Continue same. ~  Labs 6/13 showed Vit D level = 53... Continue same. ~  Labs 7/14 showed Vit D level = 67  Hx of ACOUSTIC NEUROMA (ICD-225.1) - s/p surg for this tumor 10/02 at [facility] DrBrowne... no known recurrence... ~  MRI Brain 2/07 showed s/p translabyrinthine approach to right acoustic neuroma w/o recurrence, NAD... ~  MRI Brain 10/10 showed no recurrence of tumor, but 4-95mm basilar tip aneurysm detected & Rx as above. ~  MRI Brain 2/11 showed no recurrence/ post op changes, endovasc oblit of basilar aneurysm, cerebellar lacunar infarct & 50% stenosis of right PCA... PLAVIX restarted... she had review by [facility] IR & Neurology (as above)... ~  We discussed f/u MRI since DrBrowne hasn't seen her since 2011 (?he turned her over to DrMorris)... ~  9/14:  MRI Brain showed no evid of resid or recurrent vestib schwannoma on the right, post surg changes w/ scarring in the int auditory canal, ols sm vessel cerebellar infarctions are stable, mild atrophy & sm vessel dis, prev coiled basilar tip aneurysm, no acute insults...   RLS symptoms >> she complained 1/14 about new onset RLS symptoms- discussed trial Mirapex 0.25mg  prn...  ANXIETY (ICD-300.00) - uses CHLORAZEPATE 7.5mg Tid... she gets claustrophobic and needs sedation before MRIs.   Past Surgical History  Procedure  Laterality Date  . Translabyrinthine procedure  2002    [facility] Dr. Vicie Mutters  . Total abdominal hysterectomy    . Breast lumpectomy    . Aneurysm coiling  11-10    Dr. Estanislado Pandy    Outpatient Encounter Prescriptions as of 07/09/2013  Medication Sig  . acetaminophen (TYLENOL) 500 MG tablet Take 500 mg by mouth every 6 (six) hours as needed.    Marland Kitchen aspirin 325 MG tablet Take 325 mg by mouth daily.    . Calcium Carbonate-Vitamin D (CALTRATE 600+D) 600-400 MG-UNIT per tablet Take 1 tablet by mouth 2 (two) times daily.    . cholecalciferol (VITAMIN D) 1000 UNITS tablet Take 1,000  Units by mouth daily.    . clopidogrel (PLAVIX) 75 MG tablet TAKE 1 TABLET DAILY  . clorazepate (TRANXENE) 7.5 MG tablet Take 1 tablet (7.5 mg total) by mouth 3 (three) times daily as needed.  Marland Kitchen levothyroxine (SYNTHROID, LEVOTHROID) 75 MCG tablet TAKE 1 TABLET DAILY  . pramipexole (MIRAPEX) 0.25 MG tablet Take 1 tablet (0.25 mg total) by mouth at bedtime. For restless leg symptoms  . pravastatin (PRAVACHOL) 40 MG tablet TAKE 1 TABLET DAILY  . senna-docusate (STOOL SOFTENER & LAXATIVE) 8.6-50 MG per tablet Take 2 tablets by mouth at bedtime.    . traMADol (ULTRAM) 50 MG tablet Take 1 tablet (50 mg total) by mouth 3 (three) times daily as needed for pain.    Allergies  Allergen Reactions  . Atorvastatin     REACTION: INTOL to Lipitor w/ arm pain (3/09)  . Hydrocodone-Acetaminophen     REACTION: hallucinations  . Morphine     REACTION: hallucinations    Current Medications, Allergies, Past Medical History, Past Surgical History, Family History, and Social History were reviewed in [employer] record.   Review of Systems         See HPI - all other systems neg except as noted... The patient complains of dyspnea on exertion, recent palpitations & HAs.  The patient denies anorexia, fever, weight loss, weight gain, vision loss, decreased hearing, hoarseness, chest pain, syncope, peripheral edema,  prolonged cough, hemoptysis, abdominal pain, melena, hematochezia, severe indigestion/heartburn, hematuria, incontinence, muscle weakness, suspicious skin lesions, transient blindness, difficulty walking, depression, unusual weight change, abnormal bleeding, enlarged lymph nodes, and angioedema.     Objective:   Physical Exam     WD, WN, 73 y/o WF in NAD... GENERAL:  Alert & oriented; pleasant & cooperative... HEENT:  [place]/AT, EOM-wnl, PERRLA, EACs-clear, TMs- some scarring on right, NOSE-clear, THROAT-clear & wnl. NECK:  Supple w/ fairROM; no JVD; normal carotid impulses w/o bruits; palp thyroid, no nodules felt; no lymphadenopathy. CHEST:  Clear to P & A; without wheezes/ rales/ or rhonchi heard... HEART:  Regular Rhythm; gr1/6 SEM,  no irregularity, rubs or gallops heard... ABDOMEN:  Soft & nontender; normal bowel sounds; no organomegaly or masses detected. EXT: without deformities, mild arthritic changes; no varicose veins/ venous insuffic/ or edema... +trigger points about the neck/ shoulders w/ decr ROM right shoulder w/ pain... NEURO:  CN's intact; no focal neuro deficits... gait & station are normal. DERM:  No lesions noted; no rash etc...  RADIOLOGY DATA:  Reviewed in the EPIC EMR & discussed w/ the patient...  LABORATORY DATA:  Reviewed in the EPIC EMR & discussed w/ the patient...   Assessment & Plan:    PALPITATIONS>> we discussed eval w/ CXR, EKG, 2DEcho & Holter monitor; all studies returned OK/NEG as above; we stressed the importance of no nicotine, no caffeine, etc... Chronic daily HAs>> this is not really a new prob but in light of her prev neuro issues we may need Neurology consult; for now use Tramadol & Tylenol & try supression w/ Topamax25=>50mg /d as tolerated...   COPD/ Smoker>  She denies cough, notes min sputum/ etc; desperately needs to quit smokng- offered counseling, medications, etc but she declines...  Hx mild MVP>  No MVP seen on 2DEcho 2/15...  Hx right  sided Acoustic Neuroma- s/p surg 2002 at [facility] DrBrowne> no known recurrence to date including MRI 9/14...  CEREBROVASC Dis w/ basilar tip aneurysm stent & coiling 11/10 by DrDeveshwar; f/u arteriogram 2/11 at [facility] & 8/11 at  [facility] showed some compromise of the post circ w/ prolapse of the coils into the P1 segm;  Note: MRI 2/11 done for new gait abn showed cbll infarct & ASA/ Plavix restarted and she did home phys therapy w/ improvement;  Repeat IR eval 8/12 w/ CTA showed oblit of the aneurysm & distal flow appears good as well- they rec repeat studies in 2 yrs=> Done 9/14 in [place] & no aneurysm seen, stent ok w/ good flow thru the area, felt to be stable...  HYPERLIPID>  On Prav40 + diet;  FLP looks OK but needs better diet & take med Qhs...  HYPOTHYROID>  On Levothy 69mcg/d now w/ TSH= 0.71 today; she is rec to continue this dose...  IBS w/ Constip>  She uses OTC laxatives as needed & we discussed Miralax/ Senakot-S/ etc...  ORTHO>  Prev right shoulder discomfort improved spont & MRI was never done; currently uses Tramadol Prn...  OSTEOPENIA/ Vit D defic>  Stable on Calcium, MVI, Vit D supplement; due for f/u BMD=> pending...  RLS symptoms>  trial Mirapex 0.25mg  hs prn...  ANXIETY>  Uses Tranxene Prn (she has some claustrophobia & needs sedation before MRIs- she doesn't remember the 2/11 MRI being done!).Marland KitchenMarland Kitchen   Patient's Medications  New Prescriptions   TOPIRAMATE (TOPAMAX) 25 MG TABLET    Start with 1 tablet daily x 2 weeks, then 1 tablet two times daily thereafter  Previous Medications   ACETAMINOPHEN (TYLENOL) 500 MG TABLET    Take 500 mg by mouth every 6 (six) hours as needed.     ASPIRIN 325 MG TABLET    Take 325 mg by mouth daily.     CALCIUM CARBONATE-VITAMIN D (CALTRATE 600+D) 600-400 MG-UNIT PER TABLET    Take 1 tablet by mouth 2 (two) times daily.     CHOLECALCIFEROL (VITAMIN D) 1000 UNITS TABLET    Take 1,000 Units by mouth daily.     CLORAZEPATE (TRANXENE) 7.5 MG TABLET    Take 1  tablet (7.5 mg total) by mouth 3 (three) times daily as needed.   LEVOTHYROXINE (SYNTHROID, LEVOTHROID) 75 MCG TABLET    TAKE 1 TABLET DAILY   PRAVASTATIN (PRAVACHOL) 40 MG TABLET    TAKE 1 TABLET DAILY   SENNA-DOCUSATE (STOOL SOFTENER & LAXATIVE) 8.6-50 MG PER TABLET    Take 2 tablets by mouth at bedtime.    Modified Medications   Modified Medication Previous Medication   CLOPIDOGREL (PLAVIX) 75 MG TABLET clopidogrel (PLAVIX) 75 MG tablet      TAKE 1 TABLET DAILY    TAKE 1 TABLET DAILY   PRAMIPEXOLE (MIRAPEX) 0.25 MG TABLET pramipexole (MIRAPEX) 0.25 MG tablet      TAKE 1 TABLET AT BEDTIME FOR RESTLESS LEG SYMPTOMS    TAKE 1 TABLET AT BEDTIME FOR RESTLESS LEG SYMPTOMS   TRAMADOL (ULTRAM) 50 MG TABLET traMADol (ULTRAM) 50 MG tablet      Take 1 tablet (50 mg total) by mouth 3 (three) times daily as needed.    Take 1 tablet (50 mg total) by mouth 3 (three) times daily as needed for pain.  Discontinued Medications   PRAMIPEXOLE (MIRAPEX) 0.25 MG TABLET    Take 1 tablet (0.25 mg total) by mouth at bedtime. For restless leg symptoms

## 2013-07-09 NOTE — Patient Instructions (Signed)
Today we updated your med list in our EPIC system...    Continue your current medications the same...  We added TOPAMAX 25mg  tabs for your HEADACHES >>     Start w/ one tab daily for 2 weeks,     Then increase to 1 tab twice daily thereafter...  To further evaluate the skipping/ palpitations>>    We are going to set up a HOLTER Monitor & a 2DECHO of your heart...    We will contact you w/ the results when available...   Finally you are in need of a f/u CXR & Bone density test...  PLEASE, PLEase, please, QUIT SMOKING!!!  Call for any questions...  Let's plan a follow up visit in 42mo, sooner if needed for problems.Marland KitchenMarland Kitchen

## 2013-07-11 ENCOUNTER — Other Ambulatory Visit: Payer: Self-pay | Admitting: Pulmonary Disease

## 2013-07-14 ENCOUNTER — Encounter: Payer: Self-pay | Admitting: *Deleted

## 2013-07-14 ENCOUNTER — Encounter: Payer: Self-pay | Admitting: Cardiovascular Disease

## 2013-07-14 ENCOUNTER — Encounter (INDEPENDENT_AMBULATORY_CARE_PROVIDER_SITE_OTHER): Payer: Medicare Other

## 2013-07-14 ENCOUNTER — Ambulatory Visit (HOSPITAL_COMMUNITY): Payer: Medicare Other | Attending: Pulmonary Disease | Admitting: Radiology

## 2013-07-14 DIAGNOSIS — Z8679 Personal history of other diseases of the circulatory system: Secondary | ICD-10-CM

## 2013-07-14 DIAGNOSIS — R002 Palpitations: Secondary | ICD-10-CM | POA: Diagnosis not present

## 2013-07-14 DIAGNOSIS — J449 Chronic obstructive pulmonary disease, unspecified: Secondary | ICD-10-CM | POA: Insufficient documentation

## 2013-07-14 DIAGNOSIS — J4489 Other specified chronic obstructive pulmonary disease: Secondary | ICD-10-CM | POA: Insufficient documentation

## 2013-07-14 DIAGNOSIS — I059 Rheumatic mitral valve disease, unspecified: Secondary | ICD-10-CM | POA: Diagnosis not present

## 2013-07-14 DIAGNOSIS — F17200 Nicotine dependence, unspecified, uncomplicated: Secondary | ICD-10-CM | POA: Diagnosis not present

## 2013-07-14 DIAGNOSIS — E785 Hyperlipidemia, unspecified: Secondary | ICD-10-CM | POA: Diagnosis not present

## 2013-07-14 NOTE — Progress Notes (Signed)
Echocardiogram performed.  

## 2013-07-14 NOTE — Progress Notes (Signed)
Patient ID: Ana Santos, female   DOB: October 07, 1940, 73 y.o.   MRN: [id number] E-Cardio 24 hour holter monitor applied to patient.

## 2013-07-15 ENCOUNTER — Ambulatory Visit (INDEPENDENT_AMBULATORY_CARE_PROVIDER_SITE_OTHER)
Admission: RE | Admit: 2013-07-15 | Discharge: 2013-07-15 | Disposition: A | Discharge status: Home / Self Care | Payer: Medicare Other | Source: Ambulatory Visit | Attending: Pulmonary Disease | Admitting: Pulmonary Disease

## 2013-07-15 DIAGNOSIS — M899 Disorder of bone, unspecified: Secondary | ICD-10-CM

## 2013-07-15 DIAGNOSIS — M949 Disorder of cartilage, unspecified: Principal | ICD-10-CM

## 2013-07-18 ENCOUNTER — Telehealth: Payer: Self-pay | Admitting: Cardiology

## 2013-07-18 NOTE — Telephone Encounter (Signed)
Dr. Hassell Done Interpretation of 24 hr Holter Monitor worn on 07/14/13: NSR, PAC's and PVC's corresponded to SOB. No pathological arrhythmias.

## 2013-07-18 NOTE — Telephone Encounter (Signed)
Pt notified. To Dr. Lenna Gilford.

## 2013-07-27 DIAGNOSIS — H539 Unspecified visual disturbance: Secondary | ICD-10-CM | POA: Diagnosis not present

## 2013-07-27 DIAGNOSIS — I4891 Unspecified atrial fibrillation: Secondary | ICD-10-CM | POA: Diagnosis not present

## 2013-07-27 DIAGNOSIS — I498 Other specified cardiac arrhythmias: Secondary | ICD-10-CM | POA: Diagnosis not present

## 2013-07-27 DIAGNOSIS — R918 Other nonspecific abnormal finding of lung field: Secondary | ICD-10-CM | POA: Diagnosis not present

## 2013-07-27 DIAGNOSIS — D72829 Elevated white blood cell count, unspecified: Secondary | ICD-10-CM | POA: Diagnosis not present

## 2013-07-27 DIAGNOSIS — H532 Diplopia: Secondary | ICD-10-CM | POA: Diagnosis not present

## 2013-07-27 DIAGNOSIS — Z7982 Long term (current) use of aspirin: Secondary | ICD-10-CM | POA: Diagnosis not present

## 2013-07-27 DIAGNOSIS — N390 Urinary tract infection, site not specified: Secondary | ICD-10-CM | POA: Diagnosis not present

## 2013-07-27 DIAGNOSIS — Z7902 Long term (current) use of antithrombotics/antiplatelets: Secondary | ICD-10-CM | POA: Diagnosis not present

## 2013-07-27 DIAGNOSIS — I6350 Cerebral infarction due to unspecified occlusion or stenosis of unspecified cerebral artery: Secondary | ICD-10-CM | POA: Diagnosis not present

## 2013-07-27 DIAGNOSIS — R5381 Other malaise: Secondary | ICD-10-CM | POA: Diagnosis not present

## 2013-07-27 DIAGNOSIS — R5383 Other fatigue: Secondary | ICD-10-CM | POA: Diagnosis not present

## 2013-07-27 DIAGNOSIS — E876 Hypokalemia: Secondary | ICD-10-CM | POA: Diagnosis present

## 2013-07-27 DIAGNOSIS — E7800 Pure hypercholesterolemia, unspecified: Secondary | ICD-10-CM | POA: Diagnosis present

## 2013-07-27 DIAGNOSIS — Z79899 Other long term (current) drug therapy: Secondary | ICD-10-CM | POA: Diagnosis not present

## 2013-07-27 DIAGNOSIS — F17200 Nicotine dependence, unspecified, uncomplicated: Secondary | ICD-10-CM | POA: Diagnosis present

## 2013-07-27 DIAGNOSIS — I10 Essential (primary) hypertension: Secondary | ICD-10-CM | POA: Diagnosis not present

## 2013-07-27 DIAGNOSIS — I658 Occlusion and stenosis of other precerebral arteries: Secondary | ICD-10-CM | POA: Diagnosis not present

## 2013-07-29 ENCOUNTER — Other Ambulatory Visit: Payer: Self-pay | Admitting: Pulmonary Disease

## 2013-07-30 ENCOUNTER — Telehealth: Payer: Self-pay | Admitting: Pulmonary Disease

## 2013-07-30 MED ORDER — PRAMIPEXOLE DIHYDROCHLORIDE 0.25 MG PO TABS: ORAL_TABLET | ORAL | Status: DC

## 2013-07-30 NOTE — Telephone Encounter (Signed)
Called and spoke with pt and she is requesting a refill of the mirapex to be sent to her pharmacy for her RLS.  She stated that she was just d/c from [place] and pt stated that she had a stroke on Sunday.  She is doing some better now.  Pt is aware that this med has been sent to the pharmacy.

## 2013-08-04 ENCOUNTER — Telehealth: Payer: Self-pay | Admitting: Pulmonary Disease

## 2013-08-04 NOTE — Telephone Encounter (Signed)
Called, spoke with pt - Reports she had a stroke on Feb 14 and was admitted to [facility] states she was d/c'd yesterday and was advised to f/u with SN ASAP.  Pt states she is "misterable."  C/o legs tingling and stinging and can't get comfortable laying down or sitting up.  This started today.  Pt is requesting OV with SN ASAP this wk.  SN has no openings.  I offered TP -- pt refused and only wants to see SN.  Pls advise.  Thank you.  Last OV with SN: Jul 09, 2013; asked to f/u in 6 months

## 2013-08-06 NOTE — Telephone Encounter (Signed)
[id number] x1 

## 2013-08-06 NOTE — Telephone Encounter (Signed)
Called and spoke with pt and she is aware of appt with SN on 3/3 at 10:30.

## 2013-08-06 NOTE — Telephone Encounter (Signed)
Pt returning call.Ana Santos ° °

## 2013-08-06 NOTE — Telephone Encounter (Signed)
Please advise Ana Santos as what we are to advise pt? thanks

## 2013-08-12 ENCOUNTER — Ambulatory Visit (INDEPENDENT_AMBULATORY_CARE_PROVIDER_SITE_OTHER): Payer: Medicare Other | Admitting: Pulmonary Disease

## 2013-08-12 ENCOUNTER — Encounter: Payer: Self-pay | Admitting: Pulmonary Disease

## 2013-08-12 VITALS — BP 130/78 | HR 76 | Temp 99.9°F | Ht 62.5 in | Wt 118.0 lb

## 2013-08-12 DIAGNOSIS — I639 Cerebral infarction, unspecified: Secondary | ICD-10-CM

## 2013-08-12 DIAGNOSIS — I729 Aneurysm of unspecified site: Secondary | ICD-10-CM

## 2013-08-12 DIAGNOSIS — J4489 Other specified chronic obstructive pulmonary disease: Secondary | ICD-10-CM

## 2013-08-12 DIAGNOSIS — J449 Chronic obstructive pulmonary disease, unspecified: Secondary | ICD-10-CM | POA: Diagnosis not present

## 2013-08-12 DIAGNOSIS — I6350 Cerebral infarction due to unspecified occlusion or stenosis of unspecified cerebral artery: Secondary | ICD-10-CM | POA: Diagnosis not present

## 2013-08-12 DIAGNOSIS — E039 Hypothyroidism, unspecified: Secondary | ICD-10-CM

## 2013-08-12 DIAGNOSIS — R002 Palpitations: Secondary | ICD-10-CM

## 2013-08-12 DIAGNOSIS — I679 Cerebrovascular disease, unspecified: Secondary | ICD-10-CM

## 2013-08-12 DIAGNOSIS — F17200 Nicotine dependence, unspecified, uncomplicated: Secondary | ICD-10-CM

## 2013-08-12 DIAGNOSIS — I059 Rheumatic mitral valve disease, unspecified: Secondary | ICD-10-CM

## 2013-08-12 DIAGNOSIS — D333 Benign neoplasm of cranial nerves: Secondary | ICD-10-CM

## 2013-08-12 DIAGNOSIS — E785 Hyperlipidemia, unspecified: Secondary | ICD-10-CM

## 2013-08-12 DIAGNOSIS — F411 Generalized anxiety disorder: Secondary | ICD-10-CM

## 2013-08-12 MED ORDER — TRAMADOL HCL 50 MG PO TABS: 50.0000 mg | ORAL_TABLET | Freq: Three times a day (TID) | ORAL | Status: DC | PRN

## 2013-08-12 MED ORDER — PRAMIPEXOLE DIHYDROCHLORIDE 0.25 MG PO TABS: ORAL_TABLET | ORAL | Status: DC

## 2013-08-12 NOTE — Patient Instructions (Signed)
Today we updated your med list in our EPIC system...    Continue your current medications the same...  We will arrange for a Neurology consultation at [facility] with one of their stroke specialists...  In the meanwhile, continue to decrease & quit the smoking...     And continue your other meds including the Aspirin & Plavix the same...  Let's plan a follow up visit in 69mo, sooner if needed for problems.Marland KitchenMarland Kitchen

## 2013-08-13 NOTE — Progress Notes (Signed)
Subjective:    Patient ID: Ana Santos, female    DOB: 11-29-1940, 73 y.o.   MRN: [id number]  HPI 73 y/o WF here for a follow up visit... she has multiple medical problems as reviewed below...  SEE PREV EPIC NOTES FOR EARLIER DATA >>  ~  November 30, 2011:  45mo ROV & prev CWP from 4/13 visit resolved back to baseline; her CC is lack of energy & she is advised to quit all smoking & incr exercise program; she is fasting for yearly blood work today, stable on meds as outlined...    COPD> still smoking 1/4-1/2ppd & can't vs won't quit; not on meds, denies cough/ sputum/ hemoptysis/ SOB/ ch in DOE, etc; CXR 4/13= COPD, NAD...    Cerebrovasc dis/ Aneurysm> last f/u DrMorris IR at [facility] 8/12 w/ CTA that looked good- no resid aneurysm, distal flow appeared good & he didn't rec any changes & will f/u again in 54yrs; she is on ASA/ Plavix & is reluctant to stop the Plavix Rx since she is doing so well...    Hyperlipid> on Prav40; tol well & FLP shows TChol 151, TG 138, HDL 42, LDL 81; continue same...    Hypothyroid> last OV her TSH was 12.6 on 62mcg/d so we incr to 75; TSH today is 0.71 & stable, continue same...    DJD/ FM> she is managing quite well w/ prn Tramadol & Tylenol; prev CWP resolved...    Osteopenia> she had min osteopenia on BMD several yrs ago & takes Calcium, MVI, Vit D 1000u daily...    Acoustic Neuroma> this was resected by DrBrowne [facility] 2002; last MRI 2/11 continued to look good- post op changes, no recurrent tumor seen; we discussed f/u MRI in 2013 here if not done by DrBrowne in the interval...    Anxiety> on Tranxene as needed...    We reviewed prob list, meds, xrays and labs> see below>>  LABS 6/13:  FLP- at goals on Prav40;  Chems- wnl;  CBC- wnl;  TSH=0.71;  VitD=53...  ~  July 04, 2012:  78mo ROV & Ana Santos was seen in Nov by [place] w/ back pain- she was tried on flexeril & Percocet but proved INTOL & has found relieve via exercise & Tramadol... She has noted some symptoms c/w  RLS- intermittent but unable to rest, therefore try MIRAPEX 0.25mg  Qhs prn... Otherwise doing well w/o new complaints or concerns... Stable on her ASA/ Plavix, taking Prav40, Synthroid75, Tranxene as needed...    We reviewed prob list, meds, xrays and labs> see below for updates >>   ~  January 01, 2013:  12mo ROV & Ana Santos is due for her follow up appts in [place] [facility] but hasn't heard from DrMorris or DrBrowne & we will facilitate her f/u there... She has been under incr stress caring for her husb who had open heart surg... We reviewed the following medical problems during today's office visit >>     COPD> still smoking 1/4-1/2ppd & can't vs won't quit; not on meds, denies cough/ sputum/ hemoptysis/ SOB/ ch in DOE, etc; CXR 4/13= COPD, NAD...    Cerebrovasc dis/ Aneurysm> on ASA325, Plavix75; last f/u DrMorris IR at [facility] 8/12 w/ CTA that looked good- no resid aneurysm, distal flow appeared good & he didn't rec any changes & will f/u again in 33yrs; due now & we will refer to them...    Hyperlipid> on Prav40; tol well & FLP 7/14 shows TChol 174, TG 68, HDL 58,  LDL 103; continue same...    Hypothyroid> on Levothy75; labs 7/14 shows TSH= 2.47    DJD/ FM> she is managing quite well w/ prn Tramadol & Tylenol; prev CWP resolved...    Osteopenia> she had min osteopenia on BMD several yrs ago & takes Calcium, MVI, Vit D 1000u daily...    RLS> on Mirapex 0.25mg  Qhs as needed...     Acoustic Neuroma> this was resected by DrBrowne [facility] 2002; last MRI 2/11 continued to look good- post op changes, no recurrent tumor seen; we discussed f/u MRI here since it hasn't been done by DrBrowne in the interval...    Anxiety> on Tranxene as needed... We reviewed prob list, meds, xrays and labs> see below for updates >>   CXR 7/14> [place]...   LABS 7/14:  FLP- looks ok on Prav40;  Chems- wnl;  CBC- wnl;  TSH=2.47;  VitD=67... ADDENDUM> DrMorris ([facility], Interventional radiology) insists that we  obtain her f/u CTAngiogram & send it to him, we will also obtain f/u MRI Brain to assess her acoustic neuroma...  CTA Head 9/14 showed s/p stent assisted coiling of basilar tip aneurysm, no aneurysm recurrence, patent basilar art & left post cerebral art, mild atherosclerotic calcif in cavernous carotids w/o stenosis, post op right mastoid changes w/o recurrent tumor  MRI Brain 9/14 showed no evid of resid or recurrent vestib schwannoma on the right, post surg changes w/ scarring in the int auditory canal, ols sm vessel cerebellar infarctions are stable, mild atrophy & sm vessel dis, prev coiled basilar tip aneurysm, no acute insults...  ADDENDUM> Letter from DrMorris 9/14 after review of the CTA indicates that everything is stable and flow is satisfactory (he is leaving [place] can assist in the future if needed).  ~  July 09, 2013:  11mo ROV & Ana Santos is c/o 1wk hx palpit, skipping, & weak feeling; this occurs several times a day, lasts 30+minutes, but not assoc w/ CP/ dizzy/ syncope; she is still smoking ~1/2ppd & drinks 1-2 cups of coffee; we discussed the need for further eval w/ CXR, EKG, 2DEcho & Holter monitor; her husb sees DrMcLean for Cards... Her other CC today is chronic daily HAs- they are frontal & generalized, likely mixed musc contraction/ migraines; she has Tramadol & takes this Tid, she may add Tylenol as well, & we discussed trial of Topamax25mg - 1=>2 daily and we will refer to Neuro if not responding... We reviewed the following medical problems during today's office visit >>     COPD> still smoking 1/2ppd & can't vs won't quit; not on meds, denies cough/ sputum/ hemoptysis/ SOB/ ch in DOE, etc; CXR today 1/15= COPD, NAD; she MUST quit all smoking!    Palpit> new complaint 1/15> w/u in progress & asked to STOP SMOKING & avoid caffeine etc...     Cerebrovasc dis/ Aneurysm> on ASA325, Plavix75; last f/u DrMorris IR at [facility] 8/12 w/ CTA that looked good- no resid aneurysm, distal  flow appeared good & he didn't rec any changes; he requested that we do her f/u CTA & MRI Brain here in [place] 9/14=> good flow thru the stented basilar art, no recurrent aneurysm, no recurrent acoustic neuroma...     Hyperlipid> on Prav40; tol well & FLP 7/14 shows TChol 174, TG 68, HDL 58, LDL 103; continue same...    Hypothyroid> on Levothy75; labs 7/14 shows TSH= 2.47    DJD/ FM> she is managing quite well  w/ prn Tramadol & Tylenol; prev CWP resolved...    Osteopenia> she had min osteopenia on BMD several yrs ago & takes Calcium, MVI, Vit D 1000u daily; due for f/u BMD- pending...    RLS> on Mirapex 0.25mg  Qhs as needed...     Acoustic Neuroma> this was resected by DrBrowne [facility] 2002; last MRI 2/11 continued to look good- post op changes, no recurrent tumor seen; we discussed f/u MRI here, done 9/14 and continues to look good- no evid for recurrent or resid tumor...    Anxiety> on Tranxene as needed... We reviewed prob list, meds, xrays and labs> see below for updates >> she had the 2014 Flu vaccine 12/14...   CXR 1/15 showed norm heart size, COPD/Emphysema w/ scarring in lingula, NAD.Marland KitchenMarland Kitchen  EKG 1/15 showed NSR, rate60, poor R prog V1-3, NSSTTWA, NAD...  2DEcho 2/15 showed normal LV size & function w/ EF=55-60%, no regional wall motion abnormalities, Gr1DD, norm valves and RV...  Holter Monitor 2/15 showed NSR, PACs, PVCs, and no pathologic arrhythmias (the skips correspond to her subjective SOB/palpit)...   ~  August 12, 2013:  19mo ROV & add-on visit post hospital check>  Ana Santos developed diplopia & blurry vision and was adm to [facility] 2/15 - 07/30/13> review of records indicated that her neuro exam was otherw WNL, MRI Brain showed a right occipital lobe infarct, and MRA showed no new abnormality- no recurrent aneurysm, patent sent & good flow in the PCA; symptom resolved spont & they continued her same meds (PJA250 & Plavix75); she also had a UTI treated w/ Rochephin while in the hosp... She  denies any recurrent visual prob, speech prob, focal weakness or sensory changes; she has been able to cut down to 1cig/d and again we reviewed the critical need to quit smoking completely... She notes that she was unable to tolerate the Topamax (dizzy & nausea) tried last OV for the HAs- but she notes the HAs are diminished & respond to Tramadol/ Tylenol when needed... We discussed referral to [facility] Neurology division to one of their cerebrovasc dis/ stroke specialists for their recommendations in light of her prev issues...     We reviewed prob list, meds, xrays and labs> see below for updates >> Tramadol & Mirapex refilled today per request...  CT Brain 07/27/13 [facility] showed prior basilar tip aneurysm repair, post surg changes in the right mastoid region, NAD...  MRI Brain 2/15 showed sm areas of acute infarct in right occipital lobe (new from 9/14 MRI); chr infarcts in the cerebellum bilat, basilar art stent & coiling of basilar tip aneurysm...  MRA Brain 2/15 showed no evid for recurrent basilar tip aneurysm, mild to mod narrowing of the right P1 segment, left P1 segment stented...  CDopplers 2/15 showed some plaque in the bulbs and ICAs bilat, <50% stenoses bilat noted...   EKG 2/15 showed NSR, rate63, poor R prog V1-3, NAD...  LABS 2/15 at [facility] showed> Chems- wnl w/ Cr=1.0;  CBC- wnl w/ Hg=13.5;  Abn UA w/ UTI evident...           Problem List:   COPD (ICD-496) - she has a min smoker's cough, sm amt beige sputum... ~  CXR 5/08 w/ chr changes, LLL scar, NAD. ~  CXR 7/09 showed norm hrt size, clear lungs, NAD. ~  CXR 9/10 showed NAD. ~  CXR 10/11 showed chr changes, NAD. ~  CXR 12/12 showed COPD, NAD. ~  CXR 4/13 showed normal heart size, incr interstitial markings,  NAD. ~  CXR 1/15 showed norm heart size, COPD/Emphysema w/ scarring in lingula, NAD.  CIGARETTE SMOKER (ICD-305.1) - can't vs won't quit and not interested in smoking cessation help... now she states she's  decr to ~1/4-1/2 ppd but can't seem to improve from there... ~  She understands the critical need to quit smoking from the COPD/Pulm, Cardiac, & Vascular/Stroke standpoints;  She declines offers for smoking cessation help, chantix, nicotine replacement, etc... ~  2/15:  She was hosp at [facility] w/ Diplopia & found to have a right occipital stroke=> she has decr smoking to <1cig/d & encouraged to quit completely!!!  ?MITRAL VALVE PROLAPSE (ICD-424.0) & Hx of CHEST PAIN (ICD-786.50) - she had CP in the past and a neg cath 1995...  ~  CP eval 1995 w/ cath showing min 10% luminal irreg RCA & mild MVP, norm LVF... ~  [place] was neg- no ischemia, no infarct, EF=66%... ~  baseline EKG showed NSR, NSSTTWA (poor R prog V1-V3)... ~  EKG 1/15 showed NSR, rate60, poor R prog V1-3, NSSTTWA, NAD... ~  2DEcho 2/15 showed normal LV size & function w/ EF=55-60%, no regional wall motion abnormalities, Gr1DD, norm valves and RV  PALPITATIONS >> new complaint 1/15> asked to STOP SMOKING & avoid caffeine etc. ~  EKG 1/15 showed NSR, rate60, poor R prog V1-3, NSSTTWA, NAD... ~  2DEcho 2/15 showed normal LV size & function w/ EF=55-60%, no regional wall motion abnormalities, Gr1DD, norm valves and RV... ~  Holter Monitor 2/15 showed NSR, PACs, PVCs, and no pathologic arrhythmias (the skips correspond to her subjective SOB/palpit)...   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 referred to [facility] w/ Angiogram confirmation & subseq stent assisted coiling of the aneurysm 11/10... subseq f/u angiogram 2/11 showed complete angiographic obliteration of the aneurysm, & patent stent in distal basilar art (there was a new 50%stenosis in the right PCA)... on ASA 325mg /d & PLAVIX restarted 2/11 w/ new gait abn & cbll infarct on MRI. ~  she saw DrMorris [facility]- interventional neuroradiology- w/ another arteriogram performed 8/11- it  showed satis appearance of the basilar aneurysm stent & coiling w/ some prolapse of the coils into the P1 segm w/ mild compromise of the post cerebral art (flow looks adeq & he rec continued ASA/ Plavix therapy & f/u 65yr). ~  She had f/u DrMorris 8/12 w/ CTA that looked good- no resid aneurysm, distal flow appeared good & he didn't rec any changes- f/u planned 66yrs. ~  7/14: she is due for her f/u CTA & we will call [facility] regarding a follow up appt w/ DrMorris.. ~  9/14:  on [id number], Plavix75; last f/u DrMorris IR at [facility] 8/12 w/ CTA that looked good- no resid aneurysm, distal flow appeared good & he didn't rec any changes; he requested that we do her f/u CTA & MRI Brain here in [place] 9/14=> good flow thru the stented basilar art, no recurrent aneurysm, no recurrent acoustic neuroma...  ~  CTA Head 9/14 showed s/p stent assisted coiling of basilar tip aneurysm, no aneurysm recurrence, patent basilar art & left post cerebral art, mild atherosclerotic calcif in cavernous carotids w/o stenosis, post op right mastoid changes w/o recurrent tumor. ~  2/15: Adm to [facility] w/ Dip[lopia & eval revealed right occipital lobe infarct, everything else was stable, continued on her ASA325/ Plavix75=> we will refer to [facility] Neurology for their review of her situation.Marland KitchenMarland Kitchen  VENOUS INSUFFICIENCY (ICD-459.81) - she tells me that she saw DrKrush for vein surgery and treatment ("it's covered by my husb's insurance").  HYPERLIPIDEMIA (ICD-272.4) - now on PRAVASTATIN 40mg /d & tol well... we reviewed low chol/ low fat diet. ~  chart review shows TChol ~ 200 range in the 80's and early 90's. ~  then TChol incr to ~250 range in late 90's and Lipitor started-  ~  FLP 5/08 on Lipitor 10mg /d showed TChol 164, TG 153, HDL 45, LDL 89. ~  in 2009 c/o no energy & arm discomfort she felt was from the Lip10- therefore [place]. ~  [place] 7/09 on diet alone showed TChol 234, TG 218, HDL 40, LDL 139... rec- start Prav40. ~  FLP 3/10 on Prav40  showed TChol 182, Tg 137, HDL 47, LDL 107 ~  FLP 6/11 on Prav40 showed TChol 162, TG 230, HDL 37, LDL 88 ~  FLP 6/12 on Prav40 showed TChol 177, TG 139, HDL 44, LDL 105... Contin diet, take med every day. ~  FLP 6/13 on Prav40 showed TChol 151, TG 138, HDL 42, LDL 81  ~  FLP 7/14 on Prav40 showed TChol 174, TG 68, HDL 58, LDL 103  HYPOTHYROIDISM (ICD-244.9) - stable on LEVOTHYROID 101mcg/d now... hx Hashimoto's thyroiditis in 1989 w/ markedly pos antimicrosomal antibodies and transient hyperthy labs...  ~  labs 5/08 showed TSH= 0.53... rec- continue Levo100/d. ~  labs 7/09 showed TSH= 0.07... rec- decr Levothy100 to 1/2 daily. ~  labs 3/10 showed TSH= 17.71... rec> incr back to 155mcg/d. ~  labs 9/10 showed TSH= 0.21... continue same. ~  labs 6/11 showed TSH= 0.10... rec decr Levo100 to 1/2 daily. ~  labs 10/11 on Levo50 showed TSH= 1.25 (FreeT3 & FreeT4 normal) ~  Labs 6/12 on Levo50 showed TSH= 12.56 ==> we will contact pt & pharm to determine if taking Levo100-1/2tab daily every day? & adjust. ~  Labs 12/12 on Levo75 showed TSH= 0.88... Continue same. ~  Labs 6/13 on Levo75 showed TSH= 0.71... Continue same. ~  Labs 7/14 on Levo75 showed TSH= 2.47  IRRITABLE BOWEL SYNDROME (ICD-564.1) - had colonoscopy by DrPatterson 11/06 that was WNL.Marland Kitchen. she notes some constipation & Rx w/ colase/ senakot-S OTC...  NEPHROLITHIASIS (ICD-592.0) - last stone passed in 1993, no known recurrence since then...  Hx of TENDINITIS, RIGHT ELBOW (ICD-727.09) Hx right shoulder pain w/ eval by DrNorris (MRI was planned butnever done & symptoms improved spont)... FIBROMYALGIA (ICD-729.1) - prev severe symptoms may have reflected a flair in her FM> but much improved on Pred Rx. ~  labs 9/10 showed Sed= 24 ~  labs 6/11 showed Sed= 30 ~  labs 10/11 showed CBC=norm, Chems=norm, TFTs=norm, Sed=21, RF=neg, ANA+1:40 speckled... ~  12/11:  pt saw DrTruslow & his note is pending- she indicates he stopped her Pred, & gave her  shot in shoulder.  OSTEOPENIA (ICD-733.90) - BMD here 7/09 showed TScores -0.8 in Spine, and -1.2 in right FemNeck... rec to take Calcium, MVI, Vit D... ~  BMD repeated 2/15 & showed TScores -0.6 in Spine, and -1.6 in right FemNeck; rec to stay on Calcium, MVI, VitD, & wt bearing exercise...  VITAMIN D DEFICIENCY (ICD-268.9) ~  labs 7/09 showed Vit D level = 16... rec- start Vit D 50K weekly. ~  labs 3/10 showed Vit D level = 69... rec> change to 1000 u daily. ~  labs 6/11 showed Vit D level = 55... Continue same. ~  Labs 6/13 showed Vit D level = 53.Marland KitchenMarland Kitchen  Continue same. ~  Labs 7/14 showed Vit D level = 67  Hx of ACOUSTIC NEUROMA (ICD-225.1) - s/p surg for this tumor 10/02 at [facility] DrBrowne... no known recurrence... ~  MRI Brain 2/07 showed s/p translabyrinthine approach to right acoustic neuroma w/o recurrence, NAD... ~  MRI Brain 10/10 showed no recurrence of tumor, but 4-64mm basilar tip aneurysm detected & Rx as above. ~  MRI Brain 2/11 showed no recurrence/ post op changes, endovasc oblit of basilar aneurysm, cerebellar lacunar infarct & 50% stenosis of right PCA... PLAVIX restarted... she had review by [facility] IR & Neurology (as above)... ~  We discussed f/u MRI since DrBrowne hasn't seen her since 2011 (?he turned her over to DrMorris)... ~  9/14:  MRI Brain showed no evid of resid or recurrent vestib schwannoma on the right, post surg changes w/ scarring in the int auditory canal, ols sm vessel cerebellar infarctions are stable, mild atrophy & sm vessel dis, prev coiled basilar tip aneurysm, no acute insults...   RLS symptoms >> she complained 1/14 about new onset RLS symptoms- discussed trial Mirapex 0.25mg  prn...  ANXIETY (ICD-300.00) - uses CHLORAZEPATE 7.5mg Tid... she gets claustrophobic and needs sedation before MRIs.   Past Surgical History  Procedure Laterality Date  . Translabyrinthine procedure  2002    [facility] Dr. Vicie Mutters  . Total abdominal hysterectomy    . Breast lumpectomy    .  Aneurysm coiling  11-10    Dr. Estanislado Pandy    Outpatient Encounter Prescriptions as of 08/12/2013  Medication Sig  . acetaminophen (TYLENOL) 500 MG tablet Take 500 mg by mouth every 6 (six) hours as needed.    Marland Kitchen aspirin 325 MG tablet Take 325 mg by mouth daily.    . Calcium Carbonate-Vitamin D (CALTRATE 600+D) 600-400 MG-UNIT per tablet Take 1 tablet by mouth 2 (two) times daily.    . cholecalciferol (VITAMIN D) 1000 UNITS tablet Take 1,000 Units by mouth daily.    . clopidogrel (PLAVIX) 75 MG tablet TAKE 1 TABLET DAILY  . clorazepate (TRANXENE) 7.5 MG tablet Take 1 tablet (7.5 mg total) by mouth 3 (three) times daily as needed.  Marland Kitchen levothyroxine (SYNTHROID, LEVOTHROID) 75 MCG tablet TAKE 1 TABLET DAILY  . pramipexole (MIRAPEX) 0.25 MG tablet TAKE 1 TABLET AT BEDTIME FOR RESTLESS LEG SYMPTOMS  . pravastatin (PRAVACHOL) 40 MG tablet TAKE 1 TABLET DAILY  . senna-docusate (STOOL SOFTENER & LAXATIVE) 8.6-50 MG per tablet Take 2 tablets by mouth at bedtime.    . traMADol (ULTRAM) 50 MG tablet Take 1 tablet (50 mg total) by mouth 3 (three) times daily as needed.  . [DISCONTINUED] pramipexole (MIRAPEX) 0.25 MG tablet TAKE 1 TABLET AT BEDTIME FOR RESTLESS LEG SYMPTOMS  . [DISCONTINUED] traMADol (ULTRAM) 50 MG tablet Take 1 tablet (50 mg total) by mouth 3 (three) times daily as needed for pain.  Marland Kitchen topiramate (TOPAMAX) 25 MG tablet Start with 1 tablet daily x 2 weeks, then 1 tablet two times daily thereafter    Allergies  Allergen Reactions  . Atorvastatin     REACTION: INTOL to Lipitor w/ arm pain (3/09)  . Hydrocodone-Acetaminophen     REACTION: hallucinations  . Morphine     REACTION: hallucinations    Current Medications, Allergies, Past Medical History, Past Surgical History, Family History, and Social History were reviewed in [employer] record.   Review of Systems         See HPI - all other systems neg except as noted... The patient  complains of dyspnea on  exertion, recent palpitations & HAs.  The patient denies anorexia, fever, weight loss, weight gain, vision loss, decreased hearing, hoarseness, chest pain, syncope, peripheral edema, prolonged cough, hemoptysis, abdominal pain, melena, hematochezia, severe indigestion/heartburn, hematuria, incontinence, muscle weakness, suspicious skin lesions, transient blindness, difficulty walking, depression, unusual weight change, abnormal bleeding, enlarged lymph nodes, and angioedema.     Objective:   Physical Exam     WD, WN, 73 y/o WF in NAD... GENERAL:  Alert & oriented; pleasant & cooperative... HEENT:  [place]/AT, EOM-wnl, PERRLA, EACs-clear, TMs- some scarring on right, NOSE-clear, THROAT-clear & wnl. NECK:  Supple w/ fairROM; no JVD; normal carotid impulses w/o bruits; palp thyroid, no nodules felt; no lymphadenopathy. CHEST:  Clear to P & A; without wheezes/ rales/ or rhonchi heard... HEART:  Regular Rhythm; gr1/6 SEM,  no irregularity, rubs or gallops heard... ABDOMEN:  Soft & nontender; normal bowel sounds; no organomegaly or masses detected. EXT: without deformities, mild arthritic changes; no varicose veins/ venous insuffic/ or edema... +trigger points about the neck/ shoulders w/ decr ROM right shoulder w/ pain... NEURO:  CN's intact; no focal neuro deficits... gait & station are normal. DERM:  No lesions noted; no rash etc...  RADIOLOGY DATA:  Reviewed in the EPIC EMR & discussed w/ the patient...  LABORATORY DATA:  Reviewed in the EPIC EMR & discussed w/ the patient...   Assessment & Plan:      COPD/ Smoker>  She denies cough, notes min sputum/ etc; desperately needs to quit smokng- offered counseling, medications, etc but she declines...  Hx mild MVP>  No MVP seen on 2DEcho 2/15...  PALPITATIONS>> Prev eval w/ CXR, EKG, 2DEcho & Holter monitor=> all studies OK/NEG as above; we stressed the importance of no nicotine, no caffeine, etc...  Hx right sided Acoustic Neuroma- s/p surg  2002 at [facility] DrBrowne> no known recurrence to date including MRI 9/14...  CEREBROVASC Dis w/ basilar tip aneurysm stent & coiling 11/10 by DrDeveshwar; f/u arteriogram 2/11 at [facility] & 8/11 at [facility] showed some compromise of the post circ w/ prolapse of the coils into the P1 segm;  Note: MRI 2/11 done for new gait abn showed cbll infarct & ASA/ Plavix restarted and she did home phys therapy w/ improvement;  Repeat IR eval 8/12 w/ CTA showed oblit of the aneurysm & distal flow appears good as well- they rec repeat studies in 2 yrs=> Done 9/14 in [place] & no aneurysm seen, stent ok w/ good flow thru the area, felt to be stable... Right Occipital Lobe Infarct 2/15>  As above, she remains on ASA325 & Plavix75, we will refer to [facility] Neurology division for their recommendations...  HYPERLIPID>  On Prav40 + diet;  FLP looks OK but needs better diet & take med Qhs...  HYPOTHYROID>  On Levothy 12mcg/d now w/ TSH= 0.71 today; she is rec to continue this dose...  IBS w/ Constip>  She uses OTC laxatives as needed & we discussed Miralax/ Senakot-S/ etc...  ORTHO>  Prev right shoulder discomfort improved spont & MRI was never done; currently uses Tramadol Prn...  OSTEOPENIA/ Vit D defic>  Stable on Calcium, MVI, Vit D supplement; due for f/u BMD=> pending...  RLS symptoms>  trial Mirapex 0.25mg  hs prn...  Chronic daily HAs>>  HAs improved spontaneously- on Tramadol & Tylenol w/ adeq relief of pain; she tried Topamax but intol w/ dizzy & nausea...  ANXIETY>  Uses Tranxene Prn (she has some claustrophobia & needs sedation before MRIs- she  doesn't remember the 2/11 MRI being done!).Marland KitchenMarland Kitchen   Patient's Medications  New Prescriptions   No medications on file  Previous Medications   ACETAMINOPHEN (TYLENOL) 500 MG TABLET    Take 500 mg by mouth every 6 (six) hours as needed.     ASPIRIN 325 MG TABLET    Take 325 mg by mouth daily.     CALCIUM CARBONATE-VITAMIN D (CALTRATE 600+D) 600-400 MG-UNIT PER TABLET    Take 1  tablet by mouth 2 (two) times daily.     CHOLECALCIFEROL (VITAMIN D) 1000 UNITS TABLET    Take 1,000 Units by mouth daily.     CLOPIDOGREL (PLAVIX) 75 MG TABLET    TAKE 1 TABLET DAILY   CLORAZEPATE (TRANXENE) 7.5 MG TABLET    Take 1 tablet (7.5 mg total) by mouth 3 (three) times daily as needed.   LEVOTHYROXINE (SYNTHROID, LEVOTHROID) 75 MCG TABLET    TAKE 1 TABLET DAILY   PRAVASTATIN (PRAVACHOL) 40 MG TABLET    TAKE 1 TABLET DAILY   SENNA-DOCUSATE (STOOL SOFTENER & LAXATIVE) 8.6-50 MG PER TABLET    Take 2 tablets by mouth at bedtime.     TOPIRAMATE (TOPAMAX) 25 MG TABLET    Start with 1 tablet daily x 2 weeks, then 1 tablet two times daily thereafter  Modified Medications   Modified Medication Previous Medication   PRAMIPEXOLE (MIRAPEX) 0.25 MG TABLET pramipexole (MIRAPEX) 0.25 MG tablet      TAKE 1 TABLET AT BEDTIME FOR RESTLESS LEG SYMPTOMS    TAKE 1 TABLET AT BEDTIME FOR RESTLESS LEG SYMPTOMS   TRAMADOL (ULTRAM) 50 MG TABLET traMADol (ULTRAM) 50 MG tablet      Take 1 tablet (50 mg total) by mouth 3 (three) times daily as needed.    Take 1 tablet (50 mg total) by mouth 3 (three) times daily as needed for pain.  Discontinued Medications   No medications on file

## 2013-08-20 ENCOUNTER — Telehealth: Payer: Self-pay | Admitting: Pulmonary Disease

## 2013-08-20 DIAGNOSIS — F17200 Nicotine dependence, unspecified, uncomplicated: Secondary | ICD-10-CM | POA: Diagnosis not present

## 2013-08-20 DIAGNOSIS — Z7189 Other specified counseling: Secondary | ICD-10-CM | POA: Diagnosis not present

## 2013-08-20 DIAGNOSIS — I725 Aneurysm of other precerebral arteries: Secondary | ICD-10-CM | POA: Insufficient documentation

## 2013-08-20 DIAGNOSIS — Z79899 Other long term (current) drug therapy: Secondary | ICD-10-CM | POA: Diagnosis not present

## 2013-08-20 DIAGNOSIS — I6350 Cerebral infarction due to unspecified occlusion or stenosis of unspecified cerebral artery: Secondary | ICD-10-CM | POA: Diagnosis not present

## 2013-08-20 NOTE — Telephone Encounter (Signed)
I spoke with the pt and advised per OV note:~ Holter Monitor 2/15 showed NSR, PACs, PVCs, and no pathologic arrhythmias (the skips correspond to her subjective SOB/palpit)... . She states that was all she needed. [place] Bing, CMA

## 2013-08-26 ENCOUNTER — Emergency Department (HOSPITAL_COMMUNITY): Payer: Medicare Other

## 2013-08-26 ENCOUNTER — Emergency Department (HOSPITAL_COMMUNITY)
Admission: EM | Admit: 2013-08-26 | Discharge: 2013-08-26 | Disposition: A | Discharge status: Home / Self Care | Payer: Medicare Other | Attending: Emergency Medicine | Admitting: Emergency Medicine

## 2013-08-26 ENCOUNTER — Encounter (HOSPITAL_COMMUNITY): Payer: Self-pay | Admitting: Radiology

## 2013-08-26 DIAGNOSIS — E039 Hypothyroidism, unspecified: Secondary | ICD-10-CM | POA: Diagnosis not present

## 2013-08-26 DIAGNOSIS — Z87442 Personal history of urinary calculi: Secondary | ICD-10-CM | POA: Insufficient documentation

## 2013-08-26 DIAGNOSIS — I10 Essential (primary) hypertension: Secondary | ICD-10-CM | POA: Diagnosis not present

## 2013-08-26 DIAGNOSIS — R5383 Other fatigue: Secondary | ICD-10-CM | POA: Diagnosis not present

## 2013-08-26 DIAGNOSIS — Z7982 Long term (current) use of aspirin: Secondary | ICD-10-CM | POA: Diagnosis not present

## 2013-08-26 DIAGNOSIS — R404 Transient alteration of awareness: Secondary | ICD-10-CM | POA: Diagnosis not present

## 2013-08-26 DIAGNOSIS — Z79899 Other long term (current) drug therapy: Secondary | ICD-10-CM | POA: Diagnosis not present

## 2013-08-26 DIAGNOSIS — R279 Unspecified lack of coordination: Secondary | ICD-10-CM | POA: Diagnosis not present

## 2013-08-26 DIAGNOSIS — G459 Transient cerebral ischemic attack, unspecified: Secondary | ICD-10-CM | POA: Insufficient documentation

## 2013-08-26 DIAGNOSIS — I658 Occlusion and stenosis of other precerebral arteries: Secondary | ICD-10-CM | POA: Diagnosis not present

## 2013-08-26 DIAGNOSIS — M949 Disorder of cartilage, unspecified: Secondary | ICD-10-CM

## 2013-08-26 DIAGNOSIS — I729 Aneurysm of unspecified site: Secondary | ICD-10-CM | POA: Diagnosis not present

## 2013-08-26 DIAGNOSIS — J449 Chronic obstructive pulmonary disease, unspecified: Secondary | ICD-10-CM | POA: Insufficient documentation

## 2013-08-26 DIAGNOSIS — F17200 Nicotine dependence, unspecified, uncomplicated: Secondary | ICD-10-CM | POA: Insufficient documentation

## 2013-08-26 DIAGNOSIS — Z7902 Long term (current) use of antithrombotics/antiplatelets: Secondary | ICD-10-CM | POA: Insufficient documentation

## 2013-08-26 DIAGNOSIS — E559 Vitamin D deficiency, unspecified: Secondary | ICD-10-CM | POA: Diagnosis not present

## 2013-08-26 DIAGNOSIS — M899 Disorder of bone, unspecified: Secondary | ICD-10-CM | POA: Insufficient documentation

## 2013-08-26 DIAGNOSIS — E785 Hyperlipidemia, unspecified: Secondary | ICD-10-CM | POA: Insufficient documentation

## 2013-08-26 DIAGNOSIS — F411 Generalized anxiety disorder: Secondary | ICD-10-CM | POA: Insufficient documentation

## 2013-08-26 DIAGNOSIS — R5381 Other malaise: Secondary | ICD-10-CM | POA: Diagnosis not present

## 2013-08-26 DIAGNOSIS — Z8739 Personal history of other diseases of the musculoskeletal system and connective tissue: Secondary | ICD-10-CM | POA: Diagnosis not present

## 2013-08-26 DIAGNOSIS — H532 Diplopia: Secondary | ICD-10-CM | POA: Diagnosis not present

## 2013-08-26 DIAGNOSIS — J4489 Other specified chronic obstructive pulmonary disease: Secondary | ICD-10-CM | POA: Insufficient documentation

## 2013-08-26 DIAGNOSIS — Z8719 Personal history of other diseases of the digestive system: Secondary | ICD-10-CM | POA: Insufficient documentation

## 2013-08-26 LAB — DIFFERENTIAL
Basophils Absolute: 0 10*3/uL (ref 0.0–0.1)
Basophils Relative: 0 % (ref 0–1)
EOS ABS: 0.4 10*3/uL (ref 0.0–0.7)
Eosinophils Relative: 4 % (ref 0–5)
LYMPHS ABS: 3.4 10*3/uL (ref 0.7–4.0)
LYMPHS PCT: 36 % (ref 12–46)
Monocytes Absolute: 0.5 10*3/uL (ref 0.1–1.0)
Monocytes Relative: 6 % (ref 3–12)
NEUTROS ABS: 5.2 10*3/uL (ref 1.7–7.7)
NEUTROS PCT: 54 % (ref 43–77)

## 2013-08-26 LAB — URINE MICROSCOPIC-ADD ON

## 2013-08-26 LAB — COMPREHENSIVE METABOLIC PANEL
ALT: 13 U/L (ref 0–35)
AST: 17 U/L (ref 0–37)
Albumin: 3.6 g/dL (ref 3.5–5.2)
Alkaline Phosphatase: 109 U/L (ref 39–117)
BUN: 15 mg/dL (ref 6–23)
CO2: 23 meq/L (ref 19–32)
Calcium: 9.5 mg/dL (ref 8.4–10.5)
Chloride: 106 mEq/L (ref 96–112)
Creatinine, Ser: 0.72 mg/dL (ref 0.50–1.10)
GFR calc Af Amer: >90 mL/min (ref >90)
GFR calc non Af Amer: 84 mL/min — ABNORMAL LOW (ref >90)
Glucose, Bld: 110 mg/dL — ABNORMAL HIGH (ref 70–99)
Potassium: 3.6 mEq/L — ABNORMAL LOW (ref 3.7–5.3)
SODIUM: 144 meq/L (ref 137–147)
TOTAL PROTEIN: 6.8 g/dL (ref 6.0–8.3)
Total Bilirubin: <0.2 mg/dL — ABNORMAL LOW (ref 0.3–1.2)

## 2013-08-26 LAB — CBC
HCT: 38.2 % (ref 36.0–46.0)
HEMOGLOBIN: 12.6 g/dL (ref 12.0–15.0)
MCH: 29.4 pg (ref 26.0–34.0)
MCHC: 33 g/dL (ref 30.0–36.0)
MCV: 89 fL (ref 78.0–100.0)
PLATELETS: 287 10*3/uL (ref 150–400)
RBC: 4.29 MIL/uL (ref 3.87–5.11)
RDW: 13.3 % (ref 11.5–15.5)
WBC: 9.6 10*3/uL (ref 4.0–10.5)

## 2013-08-26 LAB — I-STAT CHEM 8, ED
BUN: 14 mg/dL (ref 6–23)
CREATININE: 0.8 mg/dL (ref 0.50–1.10)
Calcium, Ion: 1.18 mmol/L (ref 1.13–1.30)
Chloride: 106 mEq/L (ref 96–112)
GLUCOSE: 107 mg/dL — AB (ref 70–99)
HCT: 39 % (ref 36.0–46.0)
Hemoglobin: 13.3 g/dL (ref 12.0–15.0)
Potassium: 3.4 mEq/L — ABNORMAL LOW (ref 3.7–5.3)
SODIUM: 145 meq/L (ref 137–147)
TCO2: 23 mmol/L (ref 0–100)

## 2013-08-26 LAB — RAPID URINE DRUG SCREEN, HOSP PERFORMED
AMPHETAMINES: NOT DETECTED
Barbiturates: NOT DETECTED
Benzodiazepines: NOT DETECTED
Cocaine: NOT DETECTED
Opiates: NOT DETECTED
Tetrahydrocannabinol: NOT DETECTED

## 2013-08-26 LAB — URINALYSIS, ROUTINE W REFLEX MICROSCOPIC
Bilirubin Urine: NEGATIVE
GLUCOSE, UA: NEGATIVE mg/dL
Hgb urine dipstick: NEGATIVE
Ketones, ur: NEGATIVE mg/dL
Nitrite: NEGATIVE
PROTEIN: NEGATIVE mg/dL
Specific Gravity, Urine: 1.018 (ref 1.005–1.030)
UROBILINOGEN UA: 1 mg/dL (ref 0.0–1.0)
pH: 6.5 (ref 5.0–8.0)

## 2013-08-26 LAB — APTT: APTT: 29 s (ref 24–37)

## 2013-08-26 LAB — PROTIME-INR
INR: 0.98 (ref 0.00–1.49)
Prothrombin Time: 12.8 seconds (ref 11.6–15.2)

## 2013-08-26 LAB — I-STAT TROPONIN, ED: Troponin i, poc: 0 ng/mL (ref 0.00–0.08)

## 2013-08-26 LAB — ETHANOL: Alcohol, Ethyl (B): <11 mg/dL (ref 0–11)

## 2013-08-26 MED ORDER — IOHEXOL 350 MG/ML SOLN
50.0000 mL | Freq: Once | INTRAVENOUS | Status: AC | PRN
  Administered 2013-08-26: 50 mL via INTRAVENOUS

## 2013-08-26 NOTE — Discharge Instructions (Signed)
Call Dr. Lenna Gilford tomorrow to arrange to be seen in his office within the next week. Tell him that you were evaluated here for a TIA or "mini stroke". Ask Dr. Lenna Gilford to help you to stop smoking Transient Ischemic Attack A transient ischemic attack (TIA) is a "warning stroke" that causes stroke-like symptoms. A TIA does not cause lasting damage to the brain. It is important to know when to get help and what to do to prevent stroke or death.  HOME CARE   Take all medicines exactly as told by your doctor. Understand all your medicine instructions.  You may need to take aspirin or warfarin medicine. Take warfarin exactly as told.  Taking too much or too little warfarin is dangerous. Blood tests must be done as often as told by your doctor. These blood tests help your doctor make sure the amount of warfarin you are taking is right. A PT blood test measures how long it takes for blood to clot. Your PT is used to calculate another value called an INR. Your PT and INR help your doctor adjust your warfarin dosage.  Food can cause problems with warfarin and affect the results of your blood tests. This is true for foods high in vitamin K. Spinach, kale, broccoli, cabbage, collard and turnip greens, brussels sprouts, peas, cauliflower, seaweed, and parsley are high in vitamin K as well as beef and pork liver, green tea, and soybean oil. Eat the same amount of food high in vitamin K. Avoid major changes in your diet. Tell your doctor before changing your diet. Talk to a food specialist (dietitian) if you have questions.  Many medicines can cause problems with warfarin and affect your PT and INR. Tell your doctor about all medicines you take. This includes vitamins and dietary pills (supplements). Be careful with aspirin and medicines that relieve redness, soreness, and puffiness (inflammation). Do not take or stop medicines unless your doctor tells you to.  Warfarin can cause a lot of bruising or bleeding. Hold  pressure over cuts for longer than normal. Talk to your doctor about other side effects of warfarin.  Avoid sports or activities that may cause injury or bleeding.  Be careful when you shave, floss your teeth, or use sharp objects.  Avoid alcoholic drinks or drink very little alcohol while taking warfarin. Tell your doctor if you change how much alcohol you drink.  Tell your dentist and other doctors that you take warfarin before procedures.  Eat 5 or more servings of fruits and vegetables a day.  Follow your diet program as told, if you are given one.  Keep a healthy weight.  Stay active. Try to get at least 30 minutes of activity on most or all days.  Do not smoke.  Limit how much alcohol you drink even if you are not taking warfarin. Moderate alcohol use is:  No more than 2 drinks each day for men.  No more than 1 drink each day for women who are not pregnant.  Stop abusing drugs.  Keep your home safe so you do not fall. Try:  Putting grab bars in the bedroom and bathroom.  Raising toilet seats.  Putting a seat in the shower.  Keep all doctor visits a told. GET HELP IF:  Your personality changes.  You have trouble swallowing.  You are seeing two of everything.  You are dizzy.  You have a fever.  Your skin starts to break down. GET HELP RIGHT AWAY IF:  The symptoms below  may be a sign of an emergency. Do not wait to see if the symptoms go away. Call for help (911 in [place] Do not drive yourself to the hospital.  You have sudden weakness or numbness on the face, arm, or leg (especially on one side of the body).  You have sudden trouble walking or moving your arms or legs.  You have sudden confusion.  You have trouble talking or understanding.  You have sudden trouble seeing in one or both eyes.  You lose your balance or your movements are not smooth.  You have a sudden, severe headache with no known cause.  You have new chest pain or you feel your  heart beating in a unsteady way.  You are partly or totally unaware of what is going on around you. MAKE SURE YOU:   Understand these instructions.  Will watch your condition.  Will get help right away if you are not doing well or get worse. Document Released: 03/07/2008 Document Revised: 05/15/2012 Document Reviewed: 07/22/2009 [facility] Patient Information 2014 [place].

## 2013-08-26 NOTE — ED Notes (Signed)
To ED from home via EMS, code stroke called by EMS, LSN [id number], EMS reports blurred vision and ataxia, pt HTN pta, sts "I keep a headache" - reports improvement in vision on arrival, pt taken to CT on arrival, labs drawn by RN and sent, rapid response to bedside on arrival

## 2013-08-26 NOTE — ED Provider Notes (Signed)
CSN: [id number]     Arrival date & time 08/26/13  1529 History   First MD Initiated Contact with Patient 08/26/13 1548     Chief Complaint  Patient presents with  . Code Stroke    3 PM seen on arrival. Code stroke called in the field (Consider location/radiation/quality/duration/timing/severity/associated sxs/prior Treatment) HPI Patient brought by EMS. Patient developed sudden onset double vision and difficulty walking 230 p.m. today. No other associated symptoms. She is presently back to baseline. Treated but EMS with supplemental oxygen. She is presently asymptomatic. All symptoms resolve spontaneously. Patient treated herself with her usual dose of aspirin 325 mg and Plavix today. Past Medical History  Diagnosis Date  . COPD (chronic obstructive pulmonary disease)   . Cigarette smoker   . Mitral valve prolapse   . Palpitations   . Cerebrovascular disease   . Aneurysm   . Venous insufficiency   . Hyperlipidemia   . Hypothyroidism   . IBS (irritable bowel syndrome)   . Nephrolithiasis   . Tendinitis of right elbow   . Fibromyalgia   . Osteopenia   . Vitamin D deficiency   . AN (acoustic neuroma)   . Anxiety   . Insomnia    Past Surgical History  Procedure Laterality Date  . Translabyrinthine procedure  2002    [facility] Dr. Vicie Mutters  . Total abdominal hysterectomy    . Breast lumpectomy    . Aneurysm coiling  11-10    Dr. Estanislado Pandy   Family History  Problem Relation Age of Onset  . Heart attack Mother   . Cancer Father    History  Substance Use Topics  . Smoking status: Current Every Day Smoker    Types: Cigarettes  . Smokeless tobacco: Never Used     Comment: 4-5 cigs daily  . Alcohol Use: No   OB History   Grav Para Term Preterm Abortions TAB SAB Ect Mult Living                 Review of Systems  Constitutional: Negative.   HENT: Negative.   Eyes: Positive for visual disturbance.  Respiratory: Negative.   Cardiovascular: Negative.   Gastrointestinal:  Negative.   Musculoskeletal: Positive for gait problem.  Skin: Negative.   Neurological: Negative.   Psychiatric/Behavioral: Negative.   All other systems reviewed and are negative.      Allergies  Atorvastatin; Hydrocodone-acetaminophen; and Morphine  Home Medications   Current Outpatient Rx  Name  Route  Sig  Dispense  Refill  . acetaminophen (TYLENOL) 500 MG tablet   Oral   Take 500 mg by mouth every 6 (six) hours as needed.           Marland Kitchen aspirin 325 MG tablet   Oral   Take 325 mg by mouth daily.           . Calcium Carbonate-Vitamin D (CALTRATE 600+D) 600-400 MG-UNIT per tablet   Oral   Take 1 tablet by mouth 2 (two) times daily.           . cholecalciferol (VITAMIN D) 1000 UNITS tablet   Oral   Take 1,000 Units by mouth daily.           . clopidogrel (PLAVIX) 75 MG tablet      TAKE 1 TABLET DAILY   90 tablet   0   . clorazepate (TRANXENE) 7.5 MG tablet   Oral   Take 1 tablet (7.5 mg total) by mouth 3 (three) times daily as needed.  270 tablet   0   . levothyroxine (SYNTHROID, LEVOTHROID) 75 MCG tablet      TAKE 1 TABLET DAILY   90 tablet   1   . pramipexole (MIRAPEX) 0.25 MG tablet      TAKE 1 TABLET AT BEDTIME FOR RESTLESS LEG SYMPTOMS   90 tablet   1   . pravastatin (PRAVACHOL) 40 MG tablet      TAKE 1 TABLET DAILY   90 tablet   2   . senna-docusate (STOOL SOFTENER & LAXATIVE) 8.6-50 MG per tablet   Oral   Take 2 tablets by mouth at bedtime.           . topiramate (TOPAMAX) 25 MG tablet      Start with 1 tablet daily x 2 weeks, then 1 tablet two times daily thereafter   60 tablet   6   . traMADol (ULTRAM) 50 MG tablet   Oral   Take 1 tablet (50 mg total) by mouth 3 (three) times daily as needed.   270 tablet   1    BP 146/63  Pulse 71  Temp(Src) 99.7 F (37.6 C) (Oral)  Resp 18  SpO2 98% Physical Exam  Nursing note and vitals reviewed. Constitutional: She is oriented to person, place, and time. She appears  well-developed and well-nourished.  HENT:  Head: Normocephalic and atraumatic.  Eyes: Conjunctivae are normal. Pupils are equal, round, and reactive to light.  Neck: Neck supple. No tracheal deviation present. No thyromegaly present.  Cardiovascular: Normal rate and regular rhythm.   No murmur heard. Pulmonary/Chest: Effort normal and breath sounds normal.  Abdominal: Soft. Bowel sounds are normal. She exhibits no distension. There is no tenderness.  Musculoskeletal: Normal range of motion. She exhibits no edema and no tenderness.  Neurological: She is alert and oriented to person, place, and time. She has normal reflexes. No cranial nerve deficit. Coordination normal.  DTRs symmetric bilaterally at knee jerk and biceps was ordered bilaterally. Finger-nose normal heel shin normal.  Skin: Skin is warm and dry. No rash noted.  Psychiatric: She has a normal mood and affect.    ED Course  Procedures (including critical care time) Labs Review Labs Reviewed  I-STAT CHEM 8, ED - Abnormal; Notable for the following:    Potassium 3.4 (*)    Glucose, Bld 107 (*)    All other components within normal limits  ETHANOL  PROTIME-INR  APTT  CBC  DIFFERENTIAL  COMPREHENSIVE METABOLIC PANEL  URINE RAPID DRUG SCREEN (HOSP PERFORMED)  URINALYSIS, ROUTINE W REFLEX MICROSCOPIC  I-STAT TROPOININ, ED  I-STAT TROPOININ, ED   Imaging Review Ct Head Wo Contrast  08/26/2013   CLINICAL DATA:  Ataxia, double vision, code stroke  EXAM: CT HEAD WITHOUT CONTRAST  TECHNIQUE: Contiguous axial images were obtained from the base of the skull through the vertex without intravenous contrast.  COMPARISON:  MR MRA HEAD W/O CM dated 07/28/2013; US CAROTID DUPLEX BILAT dated 07/28/2013  FINDINGS: No evidence of hemorrhage or extra-axial fluid. No evidence of hydrocephalus, mass, or acute infarct. Calvarium intact. 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 Postsurgical change right mastoid  region, stable. No significant change from prior study.  IMPRESSION: No acute findings  I paged Dr. Doy Mince on 08/26/2013  at 3:46 PM.   Electronically Signed   By: Skipper Cliche M.D.   On: 08/26/2013 15:52     EKG Interpretation   Date/Time:  Tuesday August 26 2013 15:50:42 EDT Ventricular Rate:  67 PR Interval:  157 QRS Duration: 75 QT Interval:  385 QTC Calculation: 406 R Axis:   56 Text Interpretation:  Sinus rhythm Consider anterior infarct Minimal ST  depression, inferior leads No old tracing to compare Confirmed by  Winfred Leeds  MD, Matheo Rathbone [phone]) on 08/26/2013 4:24:29 PM     . Results for orders placed during the hospital encounter of 08/26/13  ETHANOL      Result Value Ref Range   Alcohol, Ethyl (B) <11  0 - 11 mg/dL  PROTIME-INR      Result Value Ref Range   Prothrombin Time 12.8  11.6 - 15.2 seconds   INR 0.98  0.00 - 1.49  APTT      Result Value Ref Range   aPTT 29  24 - 37 seconds  CBC      Result Value Ref Range   WBC 9.6  4.0 - 10.5 K/uL   RBC 4.29  3.87 - 5.11 MIL/uL   Hemoglobin 12.6  12.0 - 15.0 g/dL   HCT 38.2  36.0 - 46.0 %   MCV 89.0  78.0 - 100.0 fL   MCH 29.4  26.0 - 34.0 pg   MCHC 33.0  30.0 - 36.0 g/dL   RDW 13.3  11.5 - 15.5 %   Platelets 287  150 - 400 K/uL  DIFFERENTIAL      Result Value Ref Range   Neutrophils Relative % 54  43 - 77 %   Neutro Abs 5.2  1.7 - 7.7 K/uL   Lymphocytes Relative 36  12 - 46 %   Lymphs Abs 3.4  0.7 - 4.0 K/uL   Monocytes Relative 6  3 - 12 %   Monocytes Absolute 0.5  0.1 - 1.0 K/uL   Eosinophils Relative 4  0 - 5 %   Eosinophils Absolute 0.4  0.0 - 0.7 K/uL   Basophils Relative 0  0 - 1 %   Basophils Absolute 0.0  0.0 - 0.1 K/uL  COMPREHENSIVE METABOLIC PANEL      Result Value Ref Range   Sodium 144  137 - 147 mEq/L   Potassium 3.6 (*) 3.7 - 5.3 mEq/L   Chloride 106  96 - 112 mEq/L   CO2 23  19 - 32 mEq/L   Glucose, Bld 110 (*) 70 - 99 mg/dL   BUN 15  6 - 23 mg/dL   Creatinine, Ser 0.72  0.50 - 1.10 mg/dL    Calcium 9.5  8.4 - 10.5 mg/dL   Total Protein 6.8  6.0 - 8.3 g/dL   Albumin 3.6  3.5 - 5.2 g/dL   AST 17  0 - 37 U/L   ALT 13  0 - 35 U/L   Alkaline Phosphatase 109  39 - 117 U/L   Total Bilirubin <0.2 (*) 0.3 - 1.2 mg/dL   GFR calc non Af Amer 84 (*) >90 mL/min   GFR calc Af Amer >90  >90 mL/min  URINE RAPID DRUG SCREEN (HOSP PERFORMED)      Result Value Ref Range   Opiates NONE DETECTED  NONE DETECTED   Cocaine NONE DETECTED  NONE DETECTED   Benzodiazepines NONE DETECTED  NONE DETECTED   Amphetamines NONE DETECTED  NONE DETECTED   Tetrahydrocannabinol NONE DETECTED  NONE DETECTED   Barbiturates NONE DETECTED  NONE DETECTED  URINALYSIS, ROUTINE W REFLEX MICROSCOPIC      Result Value Ref Range   Color, Urine YELLOW  YELLOW   APPearance CLEAR  CLEAR  Specific Gravity, Urine 1.018  1.005 - 1.030   pH 6.5  5.0 - 8.0   Glucose, UA NEGATIVE  NEGATIVE mg/dL   Hgb urine dipstick NEGATIVE  NEGATIVE   Bilirubin Urine NEGATIVE  NEGATIVE   Ketones, ur NEGATIVE  NEGATIVE mg/dL   Protein, ur NEGATIVE  NEGATIVE mg/dL   Urobilinogen, UA 1.0  0.0 - 1.0 mg/dL   Nitrite NEGATIVE  NEGATIVE   Leukocytes, UA MODERATE (*) NEGATIVE  URINE MICROSCOPIC-ADD ON      Result Value Ref Range   Squamous Epithelial / LPF FEW (*) RARE   WBC, UA 7-10  <3 WBC/hpf   Bacteria, UA FEW (*) RARE   Casts HYALINE CASTS (*) NEGATIVE   Urine-Other MUCOUS PRESENT    I-STAT CHEM 8, ED      Result Value Ref Range   Sodium 145  137 - 147 mEq/L   Potassium 3.4 (*) 3.7 - 5.3 mEq/L   Chloride 106  96 - 112 mEq/L   BUN 14  6 - 23 mg/dL   Creatinine, Ser 0.80  0.50 - 1.10 mg/dL   Glucose, Bld 107 (*) 70 - 99 mg/dL   Calcium, Ion 1.18  1.13 - 1.30 mmol/L   TCO2 23  0 - 100 mmol/L   Hemoglobin 13.3  12.0 - 15.0 g/dL   HCT 39.0  36.0 - 46.0 %  I-STAT TROPOININ, ED      Result Value Ref Range   Troponin i, poc 0.00  0.00 - 0.08 ng/mL   Comment 3            Ct Angio Head W/cm &/or Wo Cm  08/26/2013   CLINICAL  DATA:  Diplopia and dizziness. History of basilar aneurysm coiling  EXAM: CT ANGIOGRAPHY HEAD AND NECK  TECHNIQUE: Multidetector CT imaging of the head and neck was performed using the standard protocol during bolus administration of intravenous contrast. Multiplanar CT image reconstructions and MIPs were obtained to evaluate the vascular anatomy. Carotid stenosis measurements (when applicable) are obtained utilizing NASCET criteria, using the distal internal carotid diameter as the denominator.  CONTRAST:  90mL OMNIPAQUE IOHEXOL 350 MG/ML SOLN  COMPARISON:  CT head 08/26/2013.  MRA head 07/28/2013  FINDINGS: CTA HEAD FINDINGS  Postcontrast imaging of the brain reveals streak artifact from basilar tip aneurysm coiling and basilar stenting. No enhancing mass. No acute infarct. Ventricle size is normal. Prior mastoidectomy on the right.  Both vertebral arteries are patent to the basilar. PICA is patent bilaterally. The basilar is widely patent. Basilar stent is patent. Superior cerebellar and posterior cerebral arteries are patent. P1 segments are partially obscured by artifact from the coiling but appear patent. No evidence of recurrent aneurysm.  Cavernous carotid is widely patent bilaterally. Anterior and middle cerebral arteries are widely patent without stenosis  Negative for cerebral aneurysm.  Review of the MIP images confirms the above findings.  CTA NECK FINDINGS  Apical emphysema and scarring bilaterally. Negative for mass or adenopathy in the neck.  There is 40% diameter stenosis of the proximal left subclavian artery. The innominate artery is widely patent.  Right carotid: Right common carotid artery is widely patent. Atherosclerotic calcification at the right carotid bifurcation. There is 40% diameter stenosis of the proximal right internal carotid artery and 40% diameter stenosis of the proximal right external carotid artery.  Left carotid: Left common carotid artery is widely patent. Atherosclerotic  calcification of the carotid bifurcation with noncalcified plaque in the carotid bulb. 40% diameter stenosis of the  proximal left internal carotid artery. 50% diameter stenosis proximal left external carotid artery.  Vertebral arteries: Atherosclerotic calcified plaque at the vertebral artery origin bilaterally causing mild stenosis. Both vertebral arteries are patent to the basilar without additional stenosis.  Review of the MIP images confirms the above findings.  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 No recurrent aneurysm. No intracranial stenosis.  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 Mild stenosis at the origin of the vertebral arteries bilaterally.   Electronically Signed   By: Franchot Gallo M.D.   On: 08/26/2013 19:08   Ct Head Wo Contrast  08/26/2013   ADDENDUM REPORT: 08/26/2013 15:58  ADDENDUM: I gave this report to the ED physician at 1558 on 08/26/13.   Electronically Signed   By: Skipper Cliche M.D.   On: 08/26/2013 15:58   08/26/2013   CLINICAL DATA:  Ataxia, double vision, code stroke  EXAM: CT HEAD WITHOUT CONTRAST  TECHNIQUE: Contiguous axial images were obtained from the base of the skull through the vertex without intravenous contrast.  COMPARISON:  MR MRA HEAD W/O CM dated 07/28/2013; US CAROTID DUPLEX BILAT dated 07/28/2013  FINDINGS: No evidence of hemorrhage or extra-axial fluid. No evidence of hydrocephalus, mass, or acute infarct. Calvarium intact. 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 Postsurgical change right mastoid region, stable. No significant change from prior study.  IMPRESSION: No acute findings  I paged Dr. Doy Mince on 08/26/2013  at 3:46 PM.  Electronically Signed: By: Skipper Cliche M.D. On: 08/26/2013 15:52   Ct Angio Neck W/cm &/or Wo/cm  08/26/2013   CLINICAL DATA:  Diplopia and dizziness. History of basilar aneurysm coiling  EXAM: CT ANGIOGRAPHY HEAD AND NECK   TECHNIQUE: Multidetector CT imaging of the head and neck was performed using the standard protocol during bolus administration of intravenous contrast. Multiplanar CT image reconstructions and MIPs were obtained to evaluate the vascular anatomy. Carotid stenosis measurements (when applicable) are obtained utilizing NASCET criteria, using the distal internal carotid diameter as the denominator.  CONTRAST:  52mL OMNIPAQUE IOHEXOL 350 MG/ML SOLN  COMPARISON:  CT head 08/26/2013.  MRA head 07/28/2013  FINDINGS: CTA HEAD FINDINGS  Postcontrast imaging of the brain reveals streak artifact from basilar tip aneurysm coiling and basilar stenting. No enhancing mass. No acute infarct. Ventricle size is normal. Prior mastoidectomy on the right.  Both vertebral arteries are patent to the basilar. PICA is patent bilaterally. The basilar is widely patent. Basilar stent is patent. Superior cerebellar and posterior cerebral arteries are patent. P1 segments are partially obscured by artifact from the coiling but appear patent. No evidence of recurrent aneurysm.  Cavernous carotid is widely patent bilaterally. Anterior and middle cerebral arteries are widely patent without stenosis  Negative for cerebral aneurysm.  Review of the MIP images confirms the above findings.  CTA NECK FINDINGS  Apical emphysema and scarring bilaterally. Negative for mass or adenopathy in the neck.  There is 40% diameter stenosis of the proximal left subclavian artery. The innominate artery is widely patent.  Right carotid: Right common carotid artery is widely patent. Atherosclerotic calcification at the right carotid bifurcation. There is 40% diameter stenosis of the proximal right internal carotid artery and 40% diameter stenosis of the proximal right external carotid artery.  Left carotid: Left common carotid artery is widely patent. Atherosclerotic calcification of the carotid bifurcation with noncalcified plaque in the carotid bulb. 40% diameter  stenosis of the proximal left internal carotid artery. 50% diameter stenosis proximal  left external carotid artery.  Vertebral arteries: Atherosclerotic calcified plaque at the vertebral artery origin bilaterally causing mild stenosis. Both vertebral arteries are patent to the basilar without additional stenosis.  Review of the MIP images confirms the above findings.  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 No recurrent aneurysm. No intracranial stenosis.  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 Mild stenosis at the origin of the vertebral arteries bilaterally.   Electronically Signed   By: Franchot Gallo M.D.   On: 08/26/2013 19:08    7:30 PM patient remains asymptomatic with at baseline per husband. He is alert ambulatory without difficulty speech clear and is ready to go home.  MDM  Case discussed with Dr. Nicole Kindred, neurology. Plan discharge to home no change in medication regimen. Followup with Dr. Lenna Gilford as outpatient I spent 5 minutes counseling patient on smoking cessation Final diagnoses:  None   diagnosis #1 transient ischemic attack #2 tobacco abuse      Orlie Dakin, MD 08/26/13 1932

## 2013-08-26 NOTE — Consult Note (Signed)
Referring Physician: Winfred Leeds    Chief Complaint: Code stroke  HPI:                                                                                                                                         Ana Santos is an 73 y.o. 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  Today she was watching TV when she had sudden onset of vertical diplopia and ataxia which fully resolved in 15 minutes. Patient was brought to [place] [facility] as a code stroke but had fully resolved at time of arrival. Currently she states she has a mild HA (she always has a mild HA) but no deficits. She is on ASA and Plavix at home.  Patient was admitted to [facility] on 07/27/13 with similar symptoms that lasted longer.  Telemetry monitoring was unremarkable and patient remained in sinus rhythm.  Carotid dopplers showed no significant stenosis.  Echocardiogram had been done recently by her PCP.  From her report the findings were unremarkable.  MRI and MRA were performed.  MRI shows an occipital infarct.  MRA showed no recurrence of her aneurysm.  Patient has since seen a stroke specialist at [facility].  She was scheduled to have a CTA this week.    Date last known well: Date: 08/26/2013 Time last known well: Time: 14:30 tPA Given: No: resolved symptoms  Past Medical History  Diagnosis Date  . COPD (chronic obstructive pulmonary disease)   . Cigarette smoker   . Mitral valve prolapse   . Palpitations   . Cerebrovascular disease   . Aneurysm   . Venous insufficiency   . Hyperlipidemia   . Hypothyroidism   . IBS (irritable bowel syndrome)   . Nephrolithiasis   . Tendinitis of right elbow   . Fibromyalgia   . Osteopenia   . Vitamin D deficiency   . AN (acoustic neuroma)   . Anxiety   . Insomnia     Past Surgical History  Procedure Laterality Date  . Translabyrinthine procedure  2002    [facility] Dr. Vicie Mutters  . Total abdominal hysterectomy    . Breast lumpectomy    . Aneurysm  coiling  11-10    Dr. Estanislado Pandy    Family History  Problem Relation Age of Onset  . Heart attack Mother   . Cancer Father    Social History:  reports that she has been smoking Cigarettes.  She has been smoking about 0.00 packs per day. She has never used smokeless tobacco. She reports that she does not drink alcohol or use illicit drugs.  Allergies:  Allergies  Allergen Reactions  . Atorvastatin     REACTION: INTOL to Lipitor w/ arm pain (3/09)  . Hydrocodone-Acetaminophen     REACTION: hallucinations  . Morphine     REACTION: hallucinations    Medications:  No current facility-administered medications for this encounter.   Current Outpatient Prescriptions  Medication Sig Dispense Refill  . acetaminophen (TYLENOL) 500 MG tablet Take 500 mg by mouth every 6 (six) hours as needed for mild pain.       Marland Kitchen aspirin 325 MG tablet Take 325 mg by mouth daily.        . Calcium Carbonate-Vitamin D (CALTRATE 600+D) 600-400 MG-UNIT per tablet Take 1 tablet by mouth 2 (two) times daily.        . cholecalciferol (VITAMIN D) 1000 UNITS tablet Take 1,000 Units by mouth daily.        . clopidogrel (PLAVIX) 75 MG tablet Take 75 mg by mouth daily with breakfast.      . clorazepate (TRANXENE) 7.5 MG tablet Take 7.5 mg by mouth 3 (three) times daily as needed for anxiety.      Marland Kitchen levothyroxine (SYNTHROID, LEVOTHROID) 75 MCG tablet Take 75 mcg by mouth daily before breakfast.      . pramipexole (MIRAPEX) 0.25 MG tablet Take 0.25 mg by mouth at bedtime as needed (for RLS).      . pravastatin (PRAVACHOL) 40 MG tablet Take 40 mg by mouth at bedtime.       . senna-docusate (STOOL SOFTENER & LAXATIVE) 8.6-50 MG per tablet Take 2 tablets by mouth at bedtime.        . traMADol (ULTRAM) 50 MG tablet Take 50 mg by mouth every 6 (six) hours as needed for moderate pain.         ROS:                                                                                                                                        History obtained from the patient  General ROS: negative for - chills, fatigue, fever, night sweats, weight gain or weight loss Psychological ROS: negative for - behavioral disorder, hallucinations, memory difficulties, mood swings or suicidal ideation Ophthalmic ROS: negative for - blurry vision, double vision, eye pain or loss of vision ENT ROS: negative for - epistaxis, nasal discharge, oral lesions, sore throat, tinnitus or vertigo Allergy and Immunology ROS: negative for - hives or itchy/watery eyes Hematological and Lymphatic ROS: negative for - bleeding problems, bruising or swollen lymph nodes Endocrine ROS: negative for - galactorrhea, hair pattern changes, polydipsia/polyuria or temperature intolerance Respiratory ROS: negative for - cough, hemoptysis, shortness of breath or wheezing Cardiovascular ROS: negative for - chest pain, dyspnea on exertion, edema or irregular heartbeat Gastrointestinal ROS: negative for - abdominal pain, diarrhea, hematemesis, nausea/vomiting or stool incontinence Genito-Urinary ROS: negative for - dysuria, hematuria, incontinence or urinary frequency/urgency Musculoskeletal ROS: negative for - joint swelling or muscular weakness Neurological ROS: as noted in HPI Dermatological ROS: negative for rash and skin lesion changes  Neurologic Examination:  Blood pressure 140/103, pulse 72, temperature 98.3 F (36.8 C), temperature source Oral, resp. rate 21, SpO2 99.00%.  Mental Status: Alert, oriented, thought content appropriate.  Speech fluent without evidence of aphasia.  Able to follow 3 step commands without difficulty. Cranial Nerves: II: Discs flat bilaterally; Visual fields grossly normal, pupils equal, round, reactive to light and  accommodation III,IV, VI: ptosis not present, extra-ocular motions intact bilaterally V,VII: smile symmetric, facial light touch sensation normal bilaterally VIII: hearing normal bilaterally IX,X: gag reflex present XI: bilateral shoulder shrug XII: midline tongue extension without atrophy or fasciculations  Motor: Right : Upper extremity   5/5    Left:     Upper extremity   5/5  Lower extremity   5/5     Lower extremity   5/5 Tone and bulk:normal tone throughout; no atrophy noted Sensory: Pinprick and light touch intact throughout, bilaterally Deep Tendon Reflexes:  Right: Upper Extremity   Left: Upper extremity   biceps (C-5 to C-6) 2/4   biceps (C-5 to C-6) 2/4 tricep (C7) 2/4    triceps (C7) 2/4 Brachioradialis (C6) 2/4  Brachioradialis (C6) 2/4  Lower Extremity Lower Extremity  quadriceps (L-2 to L-4) 2/4   quadriceps (L-2 to L-4) 2/4 Achilles (S1) 1/4   Achilles (S1) 1/4  Plantars: Right: downgoing   Left: downgoing Cerebellar: normal finger-to-nose,  normal heel-to-shin test Gait: not tested CV: pulses palpable throughout    Lab Results: Basic Metabolic Panel:  Recent Labs Lab 08/26/13 1538 08/26/13 1548  NA 144 145  K 3.6* 3.4*  CL 106 106  CO2 23  --   GLUCOSE 110* 107*  BUN 15 14  CREATININE 0.72 0.80  CALCIUM 9.5  --     Liver Function Tests:  Recent Labs Lab 08/26/13 1538  AST 17  ALT 13  ALKPHOS 109  BILITOT <0.2*  PROT 6.8  ALBUMIN 3.6   No results found for this basename: LIPASE, AMYLASE,  in the last 168 hours No results found for this basename: AMMONIA,  in the last 168 hours  CBC:  Recent Labs Lab 08/26/13 1538 08/26/13 1548  WBC 9.6  --   NEUTROABS 5.2  --   HGB 12.6 13.3  HCT 38.2 39.0  MCV 89.0  --   PLT 287  --     Cardiac Enzymes: No results found for this basename: CKTOTAL, CKMB, CKMBINDEX, TROPONINI,  in the last 168 hours  Lipid Panel: No results found for this basename: CHOL, TRIG, HDL, CHOLHDL, VLDL,  LDLCALC,  in the last 168 hours  CBG: No results found for this basename: GLUCAP,  in the last 168 hours  Microbiology: Results for orders placed during the hospital encounter of 04/26/09  MRSA PCR SCREENING     Status: None   Collection Time    04/26/09  2:15 PM      Result Value Ref Range Status   MRSA by PCR    NEGATIVE Final   Value: NEGATIVE            The GeneXpert MRSA Assay (FDA     approved for NASAL specimens     only), is one component of a     comprehensive MRSA colonization     surveillance program. It is not     intended to diagnose MRSA     infection nor to guide or     monitor treatment for     MRSA infections.    Coagulation Studies:  Recent Labs  08/26/13  1538  LABPROT 12.8  INR 0.98    Imaging: Ct Head Wo Contrast  08/26/2013   ADDENDUM REPORT: 08/26/2013 15:58  ADDENDUM: I gave this report to the ED physician at 1558 on 08/26/13.   Electronically Signed   By: Skipper Cliche M.D.   On: 08/26/2013 15:58   08/26/2013   CLINICAL DATA:  Ataxia, double vision, code stroke  EXAM: CT HEAD WITHOUT CONTRAST  TECHNIQUE: Contiguous axial images were obtained from the base of the skull through the vertex without intravenous contrast.  COMPARISON:  MR MRA HEAD W/O CM dated 07/28/2013; US CAROTID DUPLEX BILAT dated 07/28/2013  FINDINGS: No evidence of hemorrhage or extra-axial fluid. No evidence of hydrocephalus, mass, or acute infarct. Calvarium intact. 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 Postsurgical change right mastoid region, stable. No significant change from prior study.  IMPRESSION: No acute findings  I paged Dr. Doy Mince on 08/26/2013  at 3:46 PM.  Electronically Signed: By: Skipper Cliche M.D. On: 08/26/2013 15:52    Etta Quill PA-C [facility] [phone]  08/26/2013, 5:05 PM  Patient seen and examined.  Clinical course and management discussed.  Necessary edits performed.  I agree with the above.  Assessment  and plan of care developed and discussed below.    Assessment: 73 y.o. female presenting with dizziness and vertical diplopia.  Symptoms have resolved.  Head CT reviewed and shows no evidence of acute changes.  Since it seems that she had a tip of the basilar aneurysm that was coiled it is reasonable to make sure that her aneurysm has not recurred.  This would not be easily visualized on the MRI previously performed.    Stroke Risk Factors - hyperlipidemia, hypertension and smoking  Recommendations: 1.  CTA of the head and neck 2.  If no evidence of recurrence of aneurysm patient may be followed up as an outpatient and remain on Plavix and ASA.  Otherwise patient will require admission.    Alexis Goodell, MD [facility] [phone]  08/26/2013  5:37 PM

## 2013-08-26 NOTE — ED Notes (Signed)
Patient discharged.  Voiced understanding on the discharge inst.

## 2013-08-27 ENCOUNTER — Telehealth: Payer: Self-pay | Admitting: Pulmonary Disease

## 2013-08-27 NOTE — Telephone Encounter (Signed)
FYI for [place]. Pt was sent to [facility] yesterday d/t having a mini stroke. Just wanted to make [place] aware. Nothing further needed

## 2013-08-29 DIAGNOSIS — I671 Cerebral aneurysm, nonruptured: Secondary | ICD-10-CM | POA: Diagnosis not present

## 2013-09-21 ENCOUNTER — Other Ambulatory Visit: Payer: Self-pay | Admitting: Pulmonary Disease

## 2013-09-26 ENCOUNTER — Other Ambulatory Visit: Payer: Self-pay | Admitting: Pulmonary Disease

## 2013-10-15 ENCOUNTER — Ambulatory Visit (INDEPENDENT_AMBULATORY_CARE_PROVIDER_SITE_OTHER): Payer: Medicare Other | Admitting: Pulmonary Disease

## 2013-10-15 ENCOUNTER — Other Ambulatory Visit (INDEPENDENT_AMBULATORY_CARE_PROVIDER_SITE_OTHER): Payer: Medicare Other

## 2013-10-15 ENCOUNTER — Encounter: Payer: Self-pay | Admitting: Pulmonary Disease

## 2013-10-15 VITALS — BP 146/70 | HR 52 | Temp 98.2°F | Ht 62.5 in | Wt 125.0 lb

## 2013-10-15 DIAGNOSIS — J449 Chronic obstructive pulmonary disease, unspecified: Secondary | ICD-10-CM

## 2013-10-15 DIAGNOSIS — I679 Cerebrovascular disease, unspecified: Secondary | ICD-10-CM | POA: Diagnosis not present

## 2013-10-15 DIAGNOSIS — E785 Hyperlipidemia, unspecified: Secondary | ICD-10-CM | POA: Diagnosis not present

## 2013-10-15 DIAGNOSIS — I6350 Cerebral infarction due to unspecified occlusion or stenosis of unspecified cerebral artery: Secondary | ICD-10-CM | POA: Diagnosis not present

## 2013-10-15 DIAGNOSIS — D333 Benign neoplasm of cranial nerves: Secondary | ICD-10-CM

## 2013-10-15 DIAGNOSIS — E039 Hypothyroidism, unspecified: Secondary | ICD-10-CM

## 2013-10-15 DIAGNOSIS — M949 Disorder of cartilage, unspecified: Secondary | ICD-10-CM

## 2013-10-15 DIAGNOSIS — G459 Transient cerebral ischemic attack, unspecified: Secondary | ICD-10-CM | POA: Diagnosis not present

## 2013-10-15 DIAGNOSIS — F411 Generalized anxiety disorder: Secondary | ICD-10-CM

## 2013-10-15 DIAGNOSIS — J4489 Other specified chronic obstructive pulmonary disease: Secondary | ICD-10-CM

## 2013-10-15 DIAGNOSIS — G2581 Restless legs syndrome: Secondary | ICD-10-CM

## 2013-10-15 DIAGNOSIS — M899 Disorder of bone, unspecified: Secondary | ICD-10-CM

## 2013-10-15 DIAGNOSIS — I872 Venous insufficiency (chronic) (peripheral): Secondary | ICD-10-CM

## 2013-10-15 DIAGNOSIS — K589 Irritable bowel syndrome without diarrhea: Secondary | ICD-10-CM

## 2013-10-15 DIAGNOSIS — F17200 Nicotine dependence, unspecified, uncomplicated: Secondary | ICD-10-CM | POA: Diagnosis not present

## 2013-10-15 LAB — HEPATIC FUNCTION PANEL
ALT: 21 U/L (ref 0–35)
AST: 27 U/L (ref 0–37)
Albumin: 4 g/dL (ref 3.5–5.2)
Alkaline Phosphatase: 106 U/L (ref 39–117)
BILIRUBIN TOTAL: 0.4 mg/dL (ref 0.2–1.2)
Bilirubin, Direct: 0.1 mg/dL (ref 0.0–0.3)
Total Protein: 7.5 g/dL (ref 6.0–8.3)

## 2013-10-15 LAB — BASIC METABOLIC PANEL
BUN: 14 mg/dL (ref 6–23)
CO2: 27 mEq/L (ref 19–32)
Calcium: 9.4 mg/dL (ref 8.4–10.5)
Chloride: 108 mEq/L (ref 96–112)
Creatinine, Ser: 0.7 mg/dL (ref 0.4–1.2)
GFR: 95.02 mL/min (ref >60.00)
Glucose, Bld: 77 mg/dL (ref 70–99)
POTASSIUM: 3.7 meq/L (ref 3.5–5.1)
SODIUM: 141 meq/L (ref 135–145)

## 2013-10-15 LAB — TSH: TSH: 0.25 u[IU]/mL — ABNORMAL LOW (ref 0.35–4.50)

## 2013-10-15 LAB — LIPID PANEL
Cholesterol: 164 mg/dL (ref 0–200)
HDL: 48.9 mg/dL (ref >39.00)
LDL CALC: 91 mg/dL (ref 0–99)
Total CHOL/HDL Ratio: 3
Triglycerides: 120 mg/dL (ref 0.0–149.0)
VLDL: 24 mg/dL (ref 0.0–40.0)

## 2013-10-15 MED ORDER — CLORAZEPATE DIPOTASSIUM 7.5 MG PO TABS: 7.5000 mg | ORAL_TABLET | Freq: Three times a day (TID) | ORAL | Status: DC | PRN

## 2013-10-15 NOTE — Patient Instructions (Signed)
Today we updated your med list in our EPIC system...    Continue your current medications the same...    We refilled the meds you requested...  Today we did your follow up FASTING blood work...    We will contact you w/ the results when available...   Please please please quit the smoking...  Call for any questions...  Let's plan a follow up visit in 14mo, sooner if needed for problems.Marland KitchenMarland Kitchen

## 2013-10-15 NOTE — Progress Notes (Signed)
Subjective:    Patient ID: Ana Santos, female    DOB: 01-01-1941, 73 y.o.   MRN: [id number]  HPI 73 y/o WF here for a follow up visit... she has multiple medical problems as reviewed below...  SEE PREV EPIC NOTES FOR EARLIER DATA >>  ~  November 30, 2011:  58mo ROV & prev CWP from 4/13 visit resolved back to baseline; her CC is lack of energy & she is advised to quit all smoking & incr exercise program; she is fasting for yearly blood work today, stable on meds as outlined...    COPD> still smoking 1/4-1/2ppd & can't vs won't quit; not on meds, denies cough/ sputum/ hemoptysis/ SOB/ ch in DOE, etc; CXR 4/13= COPD, NAD...    Cerebrovasc dis/ Aneurysm> last f/u DrMorris IR at [facility] 8/12 w/ CTA that looked good- no resid aneurysm, distal flow appeared good & he didn't rec any changes & will f/u again in 18yrs; she is on ASA/ Plavix & is reluctant to stop the Plavix Rx since she is doing so well...    Hyperlipid> on Prav40; tol well & FLP shows TChol 151, TG 138, HDL 42, LDL 81; continue same...    Hypothyroid> last OV her TSH was 12.6 on 42mcg/d so we incr to 75; TSH today is 0.71 & stable, continue same...    DJD/ FM> she is managing quite well w/ prn Tramadol & Tylenol; prev CWP resolved...    Osteopenia> she had min osteopenia on BMD several yrs ago & takes Calcium, MVI, Vit D 1000u daily...    Acoustic Neuroma> this was resected by DrBrowne [facility] 2002; last MRI 2/11 continued to look good- post op changes, no recurrent tumor seen; we discussed f/u MRI in 2013 here if not done by DrBrowne in the interval...    Anxiety> on Tranxene as needed...    We reviewed prob list, meds, xrays and labs> see below>>  LABS 6/13:  FLP- at goals on Prav40;  Chems- wnl;  CBC- wnl;  TSH=0.71;  VitD=53...  ~  July 04, 2012:  84mo ROV & Ana Santos was seen in Nov by [place] w/ back pain- she was tried on flexeril & Percocet but proved INTOL & has found relieve via exercise & Tramadol... She has noted some symptoms c/w  RLS- intermittent but unable to rest, therefore try MIRAPEX 0.25mg  Qhs prn... Otherwise doing well w/o new complaints or concerns... Stable on her ASA/ Plavix, taking Prav40, Synthroid75, Tranxene as needed...    We reviewed prob list, meds, xrays and labs> see below for updates >>   ~  January 01, 2013:  59mo ROV & Ana Santos is due for her follow up appts in [place] [facility] but hasn't heard from DrMorris or DrBrowne & we will facilitate her f/u there... She has been under incr stress caring for her husb who had open heart surg... We reviewed the following medical problems during today's office visit >>     COPD> still smoking 1/4-1/2ppd & can't vs won't quit; not on meds, denies cough/ sputum/ hemoptysis/ SOB/ ch in DOE, etc; CXR 4/13= COPD, NAD...    Cerebrovasc dis/ Aneurysm> on ASA325, Plavix75; last f/u DrMorris IR at [facility] 8/12 w/ CTA that looked good- no resid aneurysm, distal flow appeared good & he didn't rec any changes & will f/u again in 12yrs; due now & we will refer to them...    Hyperlipid> on Prav40; tol well & FLP 7/14 shows TChol 174, TG 68, HDL 58,  LDL 103; continue same...    Hypothyroid> on Levothy75; labs 7/14 shows TSH= 2.47    DJD/ FM> she is managing quite well w/ prn Tramadol & Tylenol; prev CWP resolved...    Osteopenia> she had min osteopenia on BMD several yrs ago & takes Calcium, MVI, Vit D 1000u daily...    RLS> on Mirapex 0.25mg  Qhs as needed...     Acoustic Neuroma> this was resected by DrBrowne [facility] 2002; last MRI 2/11 continued to look good- post op changes, no recurrent tumor seen; we discussed f/u MRI here since it hasn't been done by DrBrowne in the interval...    Anxiety> on Tranxene as needed... We reviewed prob list, meds, xrays and labs> see below for updates >>   CXR 7/14> [place]...   LABS 7/14:  FLP- looks ok on Prav40;  Chems- wnl;  CBC- wnl;  TSH=2.47;  VitD=67... ADDENDUM> DrMorris ([facility], Interventional radiology) insists that we  obtain her f/u CTAngiogram & send it to him, we will also obtain f/u MRI Brain to assess her acoustic neuroma...  CTA Head 9/14 showed s/p stent assisted coiling of basilar tip aneurysm, no aneurysm recurrence, patent basilar art & left post cerebral art, mild atherosclerotic calcif in cavernous carotids w/o stenosis, post op right mastoid changes w/o recurrent tumor  MRI Brain 9/14 showed no evid of resid or recurrent vestib schwannoma on the right, post surg changes w/ scarring in the int auditory canal, ols sm vessel cerebellar infarctions are stable, mild atrophy & sm vessel dis, prev coiled basilar tip aneurysm, no acute insults...  ADDENDUM> Letter from DrMorris 9/14 after review of the CTA indicates that everything is stable and flow is satisfactory (he is leaving [place] can assist in the future if needed).  ~  July 09, 2013:  11mo ROV & Ana Santos is c/o 1wk hx palpit, skipping, & weak feeling; this occurs several times a day, lasts 30+minutes, but not assoc w/ CP/ dizzy/ syncope; she is still smoking ~1/2ppd & drinks 1-2 cups of coffee; we discussed the need for further eval w/ CXR, EKG, 2DEcho & Holter monitor; her husb sees DrMcLean for Cards... Her other CC today is chronic daily HAs- they are frontal & generalized, likely mixed musc contraction/ migraines; she has Tramadol & takes this Tid, she may add Tylenol as well, & we discussed trial of Topamax25mg - 1=>2 daily and we will refer to Neuro if not responding... We reviewed the following medical problems during today's office visit >>     COPD> still smoking 1/2ppd & can't vs won't quit; not on meds, denies cough/ sputum/ hemoptysis/ SOB/ ch in DOE, etc; CXR today 1/15= COPD, NAD; she MUST quit all smoking!    Palpit> new complaint 1/15> w/u in progress & asked to STOP SMOKING & avoid caffeine etc...     Cerebrovasc dis/ Aneurysm> on ASA325, Plavix75; last f/u DrMorris IR at [facility] 8/12 w/ CTA that looked good- no resid aneurysm, distal  flow appeared good & he didn't rec any changes; he requested that we do her f/u CTA & MRI Brain here in [place] 9/14=> good flow thru the stented basilar art, no recurrent aneurysm, no recurrent acoustic neuroma...     Hyperlipid> on Prav40; tol well & FLP 7/14 shows TChol 174, TG 68, HDL 58, LDL 103; continue same...    Hypothyroid> on Levothy75; labs 7/14 shows TSH= 2.47    DJD/ FM> she is managing quite well  w/ prn Tramadol & Tylenol; prev CWP resolved...    Osteopenia> she had min osteopenia on BMD several yrs ago & takes Calcium, MVI, Vit D 1000u daily; due for f/u BMD- pending...    RLS> on Mirapex 0.25mg  Qhs as needed...     Acoustic Neuroma> this was resected by DrBrowne [facility] 2002; last MRI 2/11 continued to look good- post op changes, no recurrent tumor seen; we discussed f/u MRI here, done 9/14 and continues to look good- no evid for recurrent or resid tumor...    Anxiety> on Tranxene as needed... We reviewed prob list, meds, xrays and labs> see below for updates >> she had the 2014 Flu vaccine 12/14...   CXR 1/15 showed norm heart size, COPD/Emphysema w/ scarring in lingula, NAD.Marland KitchenMarland Kitchen  EKG 1/15 showed NSR, rate60, poor R prog V1-3, NSSTTWA, NAD...  2DEcho 2/15 showed normal LV size & function w/ EF=55-60%, no regional wall motion abnormalities, Gr1DD, norm valves and RV...  Holter Monitor 2/15 showed NSR, PACs, PVCs, and no pathologic arrhythmias (the skips correspond to her subjective SOB/palpit)...   ~  August 12, 2013:  50mo ROV & add-on visit post hospital check>  Ana Santos developed diplopia & blurry vision and was adm to [facility] 2/15 - 07/30/13> review of records indicated that her neuro exam was otherw WNL, MRI Brain showed a right occipital lobe infarct, and MRA showed no new abnormality- no recurrent aneurysm, patent sent & good flow in the PCA; symptom resolved spont & they continued her same meds ([id number] & Plavix75); she also had a UTI treated w/ Rochephin while in the hosp... She  denies any recurrent visual prob, speech prob, focal weakness or sensory changes; she has been able to cut down to 1cig/d and again we reviewed the critical need to quit smoking completely... She notes that she was unable to tolerate the Topamax (dizzy & nausea) tried last OV for the HAs- but she notes the HAs are diminished & respond to Tramadol/ Tylenol when needed... We discussed referral to [facility] Neurology division to one of their cerebrovasc dis/ stroke specialists for their recommendations in light of her prev issues...     We reviewed prob list, meds, xrays and labs> see below for updates >> Tramadol & Mirapex refilled today per request...  CT Brain 07/27/13 [facility] showed prior basilar tip aneurysm repair, post surg changes in the right mastoid region, NAD...  MRI Brain 2/15 showed sm areas of acute infarct in right occipital lobe (new from 9/14 MRI); chr infarcts in the cerebellum bilat, basilar art stent & coiling of basilar tip aneurysm...  MRA Brain 2/15 showed no evid for recurrent basilar tip aneurysm, mild to mod narrowing of the right P1 segment, left P1 segment stented...  CDopplers 2/15 showed some plaque in the bulbs and ICAs bilat, <50% stenoses bilat noted...   EKG 2/15 showed NSR, rate63, poor R prog V1-3, NAD...  LABS 2/15 at [facility] showed> Chems- wnl w/ Cr=1.0;  CBC- wnl w/ Hg=13.5;  Abn UA w/ UTI evident...   ~  Oct 15, 2013:  39mo ROV & post ER visit> Ana Santos went to the ER 08/26/13 with another TIA characterized by diplopia & ataxia that last 45min or so; assoc mild HA, no focal neuro findings in ER; already on ASA325/ Plavix75 w/ good compliance; SEE PREV EVAL 2/15... They did CTA via ER (results below- NAD) and she was disch on same meds... She had f/u Neuro eval at [facility] 08/20/13 (prior to this ER  visit)> DrGomadam NOTE REVIEWED, they ordered CTA (subseq done via [facility]), & and Echo w/ bubble study... She denies any additional cerebral ischemic symptoms since the  3/15 ER visit; unfortunately still smoking; review of chart shows NO documented AFib in her past... She has a f/u Neuro appt in [place] in June...    She states breathing is good, still smoking 3-4cig/d, denies cough/ sput/ hemoptysis/ SOB; BP is 140/70 & she denies CP, palpit, dizzy, edema...     She remains on Pravastatin40 for her Chol; FLP 5/15 showed TChol 164, TG 120, HDL 49, LDL 91...    She is clinically stable on her Levothyroid52mcg/d but last 5/15 showed TSH= 0.25 & she is rec to decr dose to 38mcg/d (we will f/u TSH on return)... We reviewed prob list, meds, xrays and labs> see below for updates >>   EKG 3/15 showed NSR, rate67, poor R prog V1-3, otherw wnl...  CT Head 3/15 showed evid prior vertebral art aneurysm surg & post-op changes in right mastoid, NAD...  CTA Head & Neck 3/15 showed prev basilar tip aneurysm coiling & basilar stent, prior right mastoidectomy, vessels are patent & no recurrent aneurysm; neck showed apical scarring & emphysema, 40% left subclav stenosis, 40-50% bilat ICA stenoses, mild stenosis at the origin of the vertebral as well...  LABS 5/15:  FLP- at goals on Prav40;  Chems & LFTs- wnl;  TSH= 0.25 on Levothy75 & rec to decr to 51mcg/d...          Problem List:   COPD (ICD-496) - she has a min smoker's cough, sm amt beige sputum... ~  CXR 5/08 w/ chr changes, LLL scar, NAD. ~  CXR 7/09 showed norm hrt size, clear lungs, NAD. ~  CXR 9/10 showed NAD. ~  CXR 10/11 showed chr changes, NAD. ~  CXR 12/12 showed COPD, NAD. ~  CXR 4/13 showed normal heart size, incr interstitial markings, NAD. ~  CXR 1/15 showed norm heart size, COPD/Emphysema w/ scarring in lingula, NAD.  CIGARETTE SMOKER (ICD-305.1) - can't vs won't quit and not interested in smoking cessation help... now she states she's decr to ~1/4-1/2 ppd but can't seem to improve from there... ~  She understands the critical need to quit smoking from the COPD/Pulm, Cardiac, & Vascular/Stroke standpoints;   She declines offers for smoking cessation help, chantix, nicotine replacement, etc... ~  2/15:  She was hosp at [facility] w/ Diplopia & found to have a right occipital stroke=> she has decr smoking to <1cig/d & encouraged to quit completely!!! ~  5/15:  She is still smoking 3-4cig/d she says & I have begged her to quit, discussed nicotine replacement rx & alternatives...  ?MITRAL VALVE PROLAPSE (ICD-424.0) & Hx of CHEST PAIN (ICD-786.50) - she had CP in the past and a neg cath 1995...  ~  CP eval 1995 w/ cath showing min 10% luminal irreg RCA & mild MVP, norm LVF... ~  [place] was neg- no ischemia, no infarct, EF=66%... ~  baseline EKG showed NSR, NSSTTWA (poor R prog V1-V3)... ~  EKG 1/15 showed NSR, rate60, poor R prog V1-3, NSSTTWA, NAD... ~  2DEcho 2/15 showed normal LV size & function w/ EF=55-60%, no regional wall motion abnormalities, Gr1DD, norm valves and RV  PALPITATIONS >> new complaint 1/15> asked to STOP SMOKING & avoid caffeine etc. ~  EKG 1/15 showed NSR, rate60, poor R prog V1-3, NSSTTWA, NAD... ~  2DEcho 2/15 showed normal LV size & function w/ EF=55-60%, no regional  wall motion abnormalities, Gr1DD, norm valves and RV... ~  Holter Monitor 2/15 showed NSR, PACs, PVCs, and no pathologic arrhythmias (the skips correspond to her subjective SOB/palpit)...   CEREBROVASCULAR DISEASE (ICD-437.9), & ANEURYSM (ICD-442.9) - found to have a 4-45mm basilar tip aneurysm on MRI Br 10/10... referred to [facility] w/ Angiogram confirmation & subseq stent assisted coiling of the aneurysm 11/10... subseq f/u angiogram 2/11 showed complete angiographic obliteration of the aneurysm, & patent stent in distal basilar art (there was a new 50%stenosis in the right PCA)... on ASA 325mg /d & PLAVIX restarted 2/11 w/ new gait abn & cbll infarct on MRI. ~  she saw DrMorris [facility]- interventional neuroradiology- w/ another arteriogram performed 8/11- it showed satis appearance of the basilar aneurysm  stent & coiling w/ some prolapse of the coils into the P1 segm w/ mild compromise of the post cerebral art (flow looks adeq & he rec continued ASA/ Plavix therapy & f/u 36yr). ~  She had f/u DrMorris 8/12 w/ CTA that looked good- no resid aneurysm, distal flow appeared good & he didn't rec any changes- f/u planned 19yrs. ~  7/14: she is due for her f/u CTA & we will call [facility] regarding a follow up appt w/ DrMorris.. ~  9/14:  on [id number], Plavix75; last f/u DrMorris IR at [facility] 8/12 w/ CTA that looked good- no resid aneurysm, distal flow appeared good & he didn't rec any changes; he requested that we do her f/u CTA & MRI Brain here in [place] 9/14=> good flow thru the stented basilar art, no recurrent aneurysm, no recurrent acoustic neuroma...  ~  CTA Head 9/14 showed s/p stent assisted coiling of basilar tip aneurysm, no aneurysm recurrence, patent basilar art & left post cerebral art, mild atherosclerotic calcif in cavernous carotids w/o stenosis, post op right mastoid changes w/o recurrent tumor. ~  2/15: Adm to [facility] w/ Dip[lopia & eval revealed right occipital lobe infarct, everything else was stable, continued on her ASA325/ Plavix75=> we will refer to [facility] Neurology for their review of her situation... ~  3/15: she had a busy March> f/u w/ Neuro in [place] then had another TIA & presented to the ER; CTA Head & Neck showed similar to prev, no recurrent aneurysm, & they disch her on same ASA325 + Plavix75...  VENOUS INSUFFICIENCY (ICD-459.81) - she tells me that she saw DrKrush for vein surgery and treatment ("it's covered by my husb's insurance").  HYPERLIPIDEMIA (ICD-272.4) - now on PRAVASTATIN 40mg /d & tol well... we reviewed low chol/ low fat diet. ~  chart review shows TChol ~ 200 range in the 80's and early 90's. ~  then TChol incr to ~250 range in late 90's and Lipitor started-  ~  FLP 5/08 on Lipitor 10mg /d showed TChol 164, TG 153, HDL 45, LDL 89. ~  in 2009 c/o no energy & arm discomfort she  felt was from the Lip10- therefore DC'd. ~  [place] 7/09 on diet alone showed TChol 234, TG 218, HDL 40, LDL 139... rec- start Prav40. ~  FLP 3/10 on Prav40 showed TChol 182, Tg 137, HDL 47, LDL 107 ~  FLP 6/11 on Prav40 showed TChol 162, TG 230, HDL 37, LDL 88 ~  FLP 6/12 on Prav40 showed TChol 177, TG 139, HDL 44, LDL 105... Contin diet, take med every day. ~  FLP 6/13 on Prav40 showed TChol 151, TG 138, HDL 42, LDL 81  ~  FLP 7/14 on Prav40 showed TChol 174, TG 68, HDL 58,  LDL 103 ~  FLP 5/15 on Prav40 showed TChol 164, TG 120, HDL 49, LDL 91  HYPOTHYROIDISM (ICD-244.9) - stable on LEVOTHYROID 69mcg/d now... hx Hashimoto's thyroiditis in 1989 w/ markedly pos antimicrosomal antibodies and transient hyperthy labs...  ~  labs 5/08 showed TSH= 0.53... rec- continue Levo100/d. ~  labs 7/09 showed TSH= 0.07... rec- decr Levothy100 to 1/2 daily. ~  labs 3/10 showed TSH= 17.71... rec> incr back to 122mcg/d. ~  labs 9/10 showed TSH= 0.21... continue same. ~  labs 6/11 showed TSH= 0.10... rec decr Levo100 to 1/2 daily. ~  labs 10/11 on Levo50 showed TSH= 1.25 (FreeT3 & FreeT4 normal) ~  Labs 6/12 on Levo50 showed TSH= 12.56 ==> we will contact pt & pharm to determine if taking Levo100-1/2tab daily every day? & adjust. ~  Labs 12/12 on Levo75 showed TSH= 0.88... Continue same. ~  Labs 6/13 on Levo75 showed TSH= 0.71... Continue same. ~  Labs 7/14 on Levo75 showed TSH= 2.47 ~  Labs 5/15 on Levo75 showed TSH= 0.25 and we rec decr to Levothy50...  IRRITABLE BOWEL SYNDROME (ICD-564.1) - had colonoscopy by DrPatterson 11/06 that was WNL.Marland Kitchen. she notes some constipation & Rx w/ colase/ senakot-S OTC...  NEPHROLITHIASIS (ICD-592.0) - last stone passed in 1993, no known recurrence since then...  Hx of TENDINITIS, RIGHT ELBOW (ICD-727.09) Hx right shoulder pain w/ eval by DrNorris (MRI was planned butnever done & symptoms improved spont)... FIBROMYALGIA (ICD-729.1) - prev severe symptoms may have reflected a  flair in her FM> but much improved on Pred Rx. ~  labs 9/10 showed Sed= 24 ~  labs 6/11 showed Sed= 30 ~  labs 10/11 showed CBC=norm, Chems=norm, TFTs=norm, Sed=21, RF=neg, ANA+1:40 speckled... ~  12/11:  pt saw DrTruslow & his note is pending- she indicates he stopped her Pred, & gave her shot in shoulder.  OSTEOPENIA (ICD-733.90) - BMD here 7/09 showed TScores -0.8 in Spine, and -1.2 in right FemNeck... rec to take Calcium, MVI, Vit D... ~  BMD repeated 2/15 & showed TScores -0.6 in Spine, and -1.6 in right FemNeck; rec to stay on Calcium, MVI, VitD, & wt bearing exercise...  VITAMIN D DEFICIENCY (ICD-268.9) ~  labs 7/09 showed Vit D level = 16... rec- start Vit D 50K weekly. ~  labs 3/10 showed Vit D level = 69... rec> change to 1000 u daily. ~  labs 6/11 showed Vit D level = 55... Continue same. ~  Labs 6/13 showed Vit D level = 53... Continue same. ~  Labs 7/14 showed Vit D level = 67  Hx of ACOUSTIC NEUROMA (ICD-225.1) - s/p surg for this tumor 10/02 at [facility] DrBrowne... no known recurrence... ~  MRI Brain 2/07 showed s/p translabyrinthine approach to right acoustic neuroma w/o recurrence, NAD... ~  MRI Brain 10/10 showed no recurrence of tumor, but 4-31mm basilar tip aneurysm detected & Rx as above. ~  MRI Brain 2/11 showed no recurrence/ post op changes, endovasc oblit of basilar aneurysm, cerebellar lacunar infarct & 50% stenosis of right PCA... PLAVIX restarted... she [facility] Neurology (as above)... ~  We discussed f/u MRI since DrBrowne hasn't seen her since 2011 (?he turned her over to DrMorris)... ~  9/14:  MRI Brain showed no evid of resid or recurrent vestib schwannoma on the right, post surg changes w/ scarring in the int auditory canal, ols sm vessel cerebellar infarctions are stable, mild atrophy & sm vessel dis, prev coiled basilar tip aneurysm, no acute insults.Marland KitchenMarland Kitchen  RLS symptoms >> she complained 1/14 about new onset RLS symptoms- discussed trial Mirapex 0.25mg   prn...  ANXIETY (ICD-300.00) - uses CHLORAZEPATE 7.5mg Tid... she gets claustrophobic and needs sedation before MRIs.   Past Surgical History  Procedure Laterality Date  . Translabyrinthine procedure  2002    [facility] Dr. Vicie Mutters  . Total abdominal hysterectomy    . Breast lumpectomy    . Aneurysm coiling  11-10    Dr. Estanislado Pandy    Outpatient Encounter Prescriptions as of 10/15/2013  Medication Sig  . acetaminophen (TYLENOL) 500 MG tablet Take 500 mg by mouth every 6 (six) hours as needed for mild pain.   Marland Kitchen aspirin 325 MG tablet Take 325 mg by mouth daily.    . Calcium Carbonate-Vitamin D (CALTRATE 600+D) 600-400 MG-UNIT per tablet Take 1 tablet by mouth 2 (two) times daily.    . cholecalciferol (VITAMIN D) 1000 UNITS tablet Take 1,000 Units by mouth daily.    . clopidogrel (PLAVIX) 75 MG tablet TAKE 1 TABLET DAILY  . clorazepate (TRANXENE) 7.5 MG tablet Take 7.5 mg by mouth 3 (three) times daily as needed for anxiety.  Marland Kitchen levothyroxine (SYNTHROID, LEVOTHROID) 75 MCG tablet TAKE 1 TABLET DAILY  . pramipexole (MIRAPEX) 0.25 MG tablet Take 0.25 mg by mouth at bedtime as needed (for RLS).  . pravastatin (PRAVACHOL) 40 MG tablet Take 40 mg by mouth at bedtime.   . senna-docusate (STOOL SOFTENER & LAXATIVE) 8.6-50 MG per tablet Take 2 tablets by mouth at bedtime.    . traMADol (ULTRAM) 50 MG tablet Take 50 mg by mouth every 6 (six) hours as needed for moderate pain.  . [DISCONTINUED] clopidogrel (PLAVIX) 75 MG tablet Take 75 mg by mouth daily with breakfast.  . [DISCONTINUED] levothyroxine (SYNTHROID, LEVOTHROID) 75 MCG tablet Take 75 mcg by mouth daily before breakfast.    Allergies  Allergen Reactions  . Atorvastatin     REACTION: INTOL to Lipitor w/ arm pain (3/09)  . Hydrocodone-Acetaminophen     REACTION: hallucinations  . Morphine     REACTION: hallucinations  . Topamax [Topiramate] Nausea And Vomiting    *dizziness*    Current Medications, Allergies, Past Medical History, Past  Surgical History, Family History, and Social History were reviewed in [employer] record.   Review of Systems         See HPI - all other systems neg except as noted... The patient complains of dyspnea on exertion, recent palpitations & HAs.  The patient denies anorexia, fever, weight loss, weight gain, vision loss, decreased hearing, hoarseness, chest pain, syncope, peripheral edema, prolonged cough, hemoptysis, abdominal pain, melena, hematochezia, severe indigestion/heartburn, hematuria, incontinence, muscle weakness, suspicious skin lesions, transient blindness, difficulty walking, depression, unusual weight change, abnormal bleeding, enlarged lymph nodes, and angioedema.     Objective:   Physical Exam     WD, WN, 73 y/o WF in NAD... GENERAL:  Alert & oriented; pleasant & cooperative... HEENT:  [place]/AT, EOM-wnl, PERRLA, EACs-clear, TMs- some scarring on right, NOSE-clear, THROAT-clear & wnl. NECK:  Supple w/ fairROM; no JVD; normal carotid impulses w/o bruits; palp thyroid, no nodules felt; no lymphadenopathy. CHEST:  Clear to P & A; without wheezes/ rales/ or rhonchi heard... HEART:  Regular Rhythm; gr1/6 SEM,  no irregularity, rubs or gallops heard... ABDOMEN:  Soft & nontender; normal bowel sounds; no organomegaly or masses detected. EXT: without deformities, mild arthritic changes; no varicose veins/ venous insuffic/ or edema... +trigger points about the neck/ shoulders w/ decr ROM right shoulder  w/ pain... NEURO:  CN's intact; no focal neuro deficits... gait & station are normal. DERM:  No lesions noted; no rash etc...  RADIOLOGY DATA:  Reviewed in the EPIC EMR & discussed w/ the patient...  LABORATORY DATA:  Reviewed in the EPIC EMR & discussed w/ the patient...   Assessment & Plan:    COPD/ Smoker>  She denies cough, notes min sputum/ etc; desperately needs to quit smokng- offered counseling, medications, etc but she declines...  Hx mild MVP>  No MVP  seen on 2DEcho 2/15...  PALPITATIONS>> Prev eval w/ CXR, EKG, 2DEcho & Holter monitor=> all studies OK/NEG as above; we stressed the importance of no nicotine, no caffeine, etc...  Hx right sided Acoustic Neuroma- s/p surg 2002 at [facility] DrBrowne> no known recurrence to date including MRI 9/14...  CEREBROVASC Dis w/ basilar tip aneurysm stent & coiling 11/10 by DrDeveshwar; f/u arteriogram 2/11 at [facility] & 8/11 at [facility] showed some compromise of the post circ w/ prolapse of the coils into the P1 segm;  Note: MRI 2/11 done for new gait abn showed cbll infarct & ASA/ Plavix restarted and she did home phys therapy w/ improvement;  Repeat IR eval 8/12 w/ CTA showed oblit of the aneurysm & distal flow appears good as well- they rec repeat studies in 2 yrs=> Done 9/14 in [place] & no aneurysm seen, stent ok w/ good flow thru the area, felt to be stable... 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 Another TIA 3/15>  eval via ER w/ CTA Head & Neck- stable, no changes made to meds- rec to continue ASA/ Plavix, AND QUIT SMOKING....  HYPERLIPID>  On Prav40 + diet;  FLP looks OK but needs better diet & take med Qhs...  HYPOTHYROID>  On Levothy 25mcg/d now w/ TSH= 0.75 today; she is rec to decr to Levothy81mcg/d...  IBS w/ Constip>  She uses OTC laxatives as needed & we discussed Miralax/ Senakot-S/ etc...  ORTHO>  Prev right shoulder discomfort improved spont & MRI was never done; currently uses Tramadol Prn...  OSTEOPENIA/ Vit D defic>  Stable on Calcium, MVI, Vit D supplement; due for f/u BMD=> pending...  RLS symptoms>  trial Mirapex 0.25mg  hs prn...  Chronic daily HAs>>  HAs improved spontaneously- on Tramadol & Tylenol w/ adeq relief of pain; she tried Topamax but intol w/ dizzy & nausea...  ANXIETY>  Uses Tranxene Prn (she has some claustrophobia & needs sedation before MRIs- she doesn't remember the  2/11 MRI being done!).Marland KitchenMarland Kitchen   Patient's Medications  New Prescriptions   LEVOTHYROXINE 41mcg - take one tab daily...  Previous Medications   ACETAMINOPHEN (TYLENOL) 500 MG TABLET    Take 500 mg by mouth every 6 (six) hours as needed for mild pain.    ASPIRIN 325 MG TABLET    Take 325 mg by mouth daily.     CALCIUM CARBONATE-VITAMIN D (CALTRATE 600+D) 600-400 MG-UNIT PER TABLET    Take 1 tablet by mouth 2 (two) times daily.     CHOLECALCIFEROL (VITAMIN D) 1000 UNITS TABLET    Take 1,000 Units by mouth daily.     CLOPIDOGREL (PLAVIX) 75 MG TABLET    TAKE 1 TABLET DAILY   PRAMIPEXOLE (MIRAPEX) 0.25 MG TABLET    Take 0.25 mg by mouth at bedtime as needed (for RLS).   PRAVASTATIN (PRAVACHOL) 40 MG TABLET    Take 40 mg by  mouth at bedtime.    SENNA-DOCUSATE (STOOL SOFTENER & LAXATIVE) 8.6-50 MG PER TABLET    Take 2 tablets by mouth at bedtime.     TRAMADOL (ULTRAM) 50 MG TABLET    Take 50 mg by mouth every 6 (six) hours as needed for moderate pain.  Modified Medications   Modified Medication Previous Medication   CLORAZEPATE (TRANXENE) 7.5 MG TABLET clorazepate (TRANXENE) 7.5 MG tablet      Take 1 tablet (7.5 mg total) by mouth 3 (three) times daily as needed for anxiety.    Take 7.5 mg by mouth 3 (three) times daily as needed for anxiety.  Discontinued Medications   LEVOTHYROXINE (SYNTHROID, LEVOTHROID) 75 MCG TABLET    Take 75 mcg by mouth daily before breakfast.

## 2013-10-20 ENCOUNTER — Telehealth: Payer: Self-pay | Admitting: Pulmonary Disease

## 2013-10-20 NOTE — Telephone Encounter (Signed)
Notes Recorded by Noralee Space, MD on 10/16/2013 at 5:15 PM Please notify patient>  FLP looks great on Prav40- continue same... Chems & LFTs are all WNL... TSH is oversuppressed (the Levothy5mcg dose is sl too much) & rec to decr to 38mcg/d...  lmomtcb x 2  For the pt.

## 2013-10-21 MED ORDER — LEVOTHYROXINE SODIUM 50 MCG PO TABS: 50.0000 ug | ORAL_TABLET | Freq: Every day | ORAL | Status: DC

## 2013-10-21 NOTE — Telephone Encounter (Signed)
Pt returned call.  Holly D Pryor ° °

## 2013-10-21 NOTE — Telephone Encounter (Signed)
LMTCBx2 on home#. Called cell # went straight to voicemail but name on voicemail was not the pt so I did not leave a message. [place] Bing, CMA

## 2013-10-21 NOTE — Telephone Encounter (Signed)
Pt advised of results and rx for synthroid 29mcg sent to [place], CMA

## 2013-10-30 ENCOUNTER — Other Ambulatory Visit: Payer: Self-pay | Admitting: Pulmonary Disease

## 2013-10-30 MED ORDER — LEVOTHYROXINE SODIUM 50 MCG PO TABS: 50.0000 ug | ORAL_TABLET | Freq: Every day | ORAL | Status: DC

## 2013-11-24 DIAGNOSIS — I4891 Unspecified atrial fibrillation: Secondary | ICD-10-CM | POA: Diagnosis not present

## 2013-11-24 DIAGNOSIS — G458 Other transient cerebral ischemic attacks and related syndromes: Secondary | ICD-10-CM | POA: Diagnosis not present

## 2013-11-24 DIAGNOSIS — G450 Vertebro-basilar artery syndrome: Secondary | ICD-10-CM | POA: Diagnosis not present

## 2013-12-02 DIAGNOSIS — G459 Transient cerebral ischemic attack, unspecified: Secondary | ICD-10-CM | POA: Diagnosis not present

## 2013-12-02 DIAGNOSIS — G458 Other transient cerebral ischemic attacks and related syndromes: Secondary | ICD-10-CM | POA: Diagnosis not present

## 2013-12-03 ENCOUNTER — Other Ambulatory Visit: Payer: Self-pay | Admitting: Pulmonary Disease

## 2013-12-16 DIAGNOSIS — I4891 Unspecified atrial fibrillation: Secondary | ICD-10-CM | POA: Diagnosis not present

## 2014-01-02 ENCOUNTER — Other Ambulatory Visit: Payer: Self-pay | Admitting: Pulmonary Disease

## 2014-01-06 ENCOUNTER — Ambulatory Visit: Payer: Medicare Other | Admitting: Pulmonary Disease

## 2014-01-15 ENCOUNTER — Other Ambulatory Visit: Payer: Self-pay | Admitting: Pulmonary Disease

## 2014-02-19 DIAGNOSIS — L820 Inflamed seborrheic keratosis: Secondary | ICD-10-CM | POA: Diagnosis not present

## 2014-02-19 DIAGNOSIS — L723 Sebaceous cyst: Secondary | ICD-10-CM | POA: Diagnosis not present

## 2014-03-11 DIAGNOSIS — I671 Cerebral aneurysm, nonruptured: Secondary | ICD-10-CM | POA: Diagnosis not present

## 2014-03-11 DIAGNOSIS — I663 Occlusion and stenosis of cerebellar arteries: Secondary | ICD-10-CM | POA: Insufficient documentation

## 2014-03-11 DIAGNOSIS — I6529 Occlusion and stenosis of unspecified carotid artery: Secondary | ICD-10-CM | POA: Diagnosis not present

## 2014-03-11 DIAGNOSIS — I6509 Occlusion and stenosis of unspecified vertebral artery: Secondary | ICD-10-CM | POA: Diagnosis not present

## 2014-03-11 DIAGNOSIS — G450 Vertebro-basilar artery syndrome: Secondary | ICD-10-CM | POA: Diagnosis not present

## 2014-03-11 DIAGNOSIS — I658 Occlusion and stenosis of other precerebral arteries: Secondary | ICD-10-CM | POA: Diagnosis not present

## 2014-03-11 DIAGNOSIS — I63541 Cerebral infarction due to unspecified occlusion or stenosis of right cerebellar artery: Secondary | ICD-10-CM | POA: Insufficient documentation

## 2014-03-13 ENCOUNTER — Telehealth: Payer: Self-pay | Admitting: Pulmonary Disease

## 2014-03-13 MED ORDER — TRAMADOL HCL 50 MG PO TABS: 50.0000 mg | ORAL_TABLET | Freq: Three times a day (TID) | ORAL | Status: DC | PRN

## 2014-03-13 NOTE — Telephone Encounter (Signed)
Called and spoke with pt and she is requesting that the rx for the tramadol be sent in to the mail order.  This has been printed out and will need to be signed by SN.  Once this is done i will send this to the pharmacy.  Pt is aware.

## 2014-03-13 NOTE — Telephone Encounter (Signed)
Called and spoke to pt. Pt requesting tramadol refill.  Pt last seen on 10/15/2013 by SN. Tramadol last filled on 08/12/2013 for #[id number] with 1 R Tramadol 1tab TID prn  SN please advise if ok to fill.

## 2014-03-15 ENCOUNTER — Other Ambulatory Visit: Payer: Self-pay | Admitting: Pulmonary Disease

## 2014-03-22 ENCOUNTER — Other Ambulatory Visit: Payer: Self-pay | Admitting: Pulmonary Disease

## 2014-04-17 ENCOUNTER — Ambulatory Visit (INDEPENDENT_AMBULATORY_CARE_PROVIDER_SITE_OTHER): Payer: Medicare Other | Admitting: Pulmonary Disease

## 2014-04-17 ENCOUNTER — Other Ambulatory Visit (INDEPENDENT_AMBULATORY_CARE_PROVIDER_SITE_OTHER): Payer: Medicare Other

## 2014-04-17 ENCOUNTER — Encounter: Payer: Self-pay | Admitting: Pulmonary Disease

## 2014-04-17 ENCOUNTER — Ambulatory Visit: Payer: Medicare Other | Admitting: *Deleted

## 2014-04-17 VITALS — BP 124/80 | HR 52 | Temp 98.1°F | Ht 62.5 in | Wt 129.4 lb

## 2014-04-17 DIAGNOSIS — Z720 Tobacco use: Secondary | ICD-10-CM

## 2014-04-17 DIAGNOSIS — G2581 Restless legs syndrome: Secondary | ICD-10-CM

## 2014-04-17 DIAGNOSIS — R296 Repeated falls: Secondary | ICD-10-CM | POA: Diagnosis not present

## 2014-04-17 DIAGNOSIS — Z23 Encounter for immunization: Secondary | ICD-10-CM

## 2014-04-17 DIAGNOSIS — J449 Chronic obstructive pulmonary disease, unspecified: Secondary | ICD-10-CM | POA: Diagnosis not present

## 2014-04-17 DIAGNOSIS — G4452 New daily persistent headache (NDPH): Secondary | ICD-10-CM | POA: Diagnosis not present

## 2014-04-17 DIAGNOSIS — E785 Hyperlipidemia, unspecified: Secondary | ICD-10-CM | POA: Diagnosis not present

## 2014-04-17 DIAGNOSIS — E039 Hypothyroidism, unspecified: Secondary | ICD-10-CM

## 2014-04-17 DIAGNOSIS — F411 Generalized anxiety disorder: Secondary | ICD-10-CM

## 2014-04-17 DIAGNOSIS — F17200 Nicotine dependence, unspecified, uncomplicated: Secondary | ICD-10-CM

## 2014-04-17 DIAGNOSIS — I679 Cerebrovascular disease, unspecified: Secondary | ICD-10-CM

## 2014-04-17 DIAGNOSIS — W19XXXA Unspecified fall, initial encounter: Secondary | ICD-10-CM

## 2014-04-17 DIAGNOSIS — I639 Cerebral infarction, unspecified: Secondary | ICD-10-CM | POA: Diagnosis not present

## 2014-04-17 DIAGNOSIS — R531 Weakness: Secondary | ICD-10-CM

## 2014-04-17 DIAGNOSIS — D333 Benign neoplasm of cranial nerves: Secondary | ICD-10-CM

## 2014-04-17 LAB — CBC WITH DIFFERENTIAL/PLATELET
Basophils Absolute: 0 10*3/uL (ref 0.0–0.1)
Basophils Relative: 0.3 % (ref 0.0–3.0)
Eosinophils Absolute: 0.6 10*3/uL (ref 0.0–0.7)
Eosinophils Relative: 5.8 % — ABNORMAL HIGH (ref 0.0–5.0)
HCT: 40.3 % (ref 36.0–46.0)
Hemoglobin: 13.1 g/dL (ref 12.0–15.0)
LYMPHS PCT: 29.9 % (ref 12.0–46.0)
Lymphs Abs: 3 10*3/uL (ref 0.7–4.0)
MCHC: 32.6 g/dL (ref 30.0–36.0)
MCV: 90.9 fl (ref 78.0–100.0)
MONOS PCT: 6.1 % (ref 3.0–12.0)
Monocytes Absolute: 0.6 10*3/uL (ref 0.1–1.0)
NEUTROS PCT: 57.9 % (ref 43.0–77.0)
Neutro Abs: 5.8 10*3/uL (ref 1.4–7.7)
PLATELETS: 295 10*3/uL (ref 150.0–400.0)
RBC: 4.43 Mil/uL (ref 3.87–5.11)
RDW: 13 % (ref 11.5–15.5)
WBC: 10 10*3/uL (ref 4.0–10.5)

## 2014-04-17 LAB — LIPID PANEL
Cholesterol: 168 mg/dL (ref 0–200)
HDL: 43.1 mg/dL (ref >39.00)
LDL CALC: 99 mg/dL (ref 0–99)
NONHDL: 124.9
Total CHOL/HDL Ratio: 4
Triglycerides: 129 mg/dL (ref 0.0–149.0)
VLDL: 25.8 mg/dL (ref 0.0–40.0)

## 2014-04-17 LAB — SEDIMENTATION RATE: SED RATE: 36 mm/h — AB (ref 0–22)

## 2014-04-17 LAB — BASIC METABOLIC PANEL
BUN: 14 mg/dL (ref 6–23)
CALCIUM: 9.4 mg/dL (ref 8.4–10.5)
CO2: 26 mEq/L (ref 19–32)
Chloride: 108 mEq/L (ref 96–112)
Creatinine, Ser: 0.7 mg/dL (ref 0.4–1.2)
GFR: 85.7 mL/min (ref >60.00)
GLUCOSE: 79 mg/dL (ref 70–99)
POTASSIUM: 3.9 meq/L (ref 3.5–5.1)
Sodium: 141 mEq/L (ref 135–145)

## 2014-04-17 LAB — HEPATIC FUNCTION PANEL
ALBUMIN: 3.5 g/dL (ref 3.5–5.2)
ALT: 16 U/L (ref 0–35)
AST: 24 U/L (ref 0–37)
Alkaline Phosphatase: 100 U/L (ref 39–117)
Bilirubin, Direct: 0.1 mg/dL (ref 0.0–0.3)
Total Bilirubin: 0.2 mg/dL (ref 0.2–1.2)
Total Protein: 7.4 g/dL (ref 6.0–8.3)

## 2014-04-17 LAB — TSH: TSH: 9.91 u[IU]/mL — AB (ref 0.35–4.50)

## 2014-04-17 NOTE — Progress Notes (Signed)
Subjective:    Patient ID: Ana Santos, female    DOB: 1940-11-09, 73 y.o.   MRN: [id number]  HPI 73 y/o WF here for a follow up visit... she has multiple medical problems as reviewed below...  SEE PREV EPIC NOTES FOR EARLIER DATA >>  ~  November 30, 2011:  73mo ROV & prev CWP from 4/13 visit resolved back to baseline; her CC is lack of energy & she is advised to quit all smoking & incr exercise program; she is fasting for yearly blood work today, stable on meds as outlined...    COPD> still smoking 1/4-1/2ppd & can't vs won't quit; not on meds, denies cough/ sputum/ hemoptysis/ SOB/ ch in DOE, etc; CXR 4/13= COPD, NAD...    Cerebrovasc dis/ Aneurysm> last f/u DrMorris IR at [facility] 8/12 w/ CTA that looked good- no resid aneurysm, distal flow appeared good & he didn't rec any changes & will f/u again in 49yrs; she is on ASA/ Plavix & is reluctant to stop the Plavix Rx since she is doing so well...    Hyperlipid> on Prav40; tol well & FLP shows TChol 151, TG 138, HDL 42, LDL 81; continue same...    Hypothyroid> last OV her TSH was 12.6 on 95mcg/d so we incr to 75; TSH today is 0.71 & stable, continue same...    DJD/ FM> she is managing quite well w/ prn Tramadol & Tylenol; prev CWP resolved...    Osteopenia> she had min osteopenia on BMD several yrs ago & takes Calcium, MVI, Vit D 1000u daily...    Acoustic Neuroma> this was resected by DrBrowne [facility] 2002; last MRI 2/11 continued to look good- post op changes, no recurrent tumor seen; we discussed f/u MRI in 2013 here if not done by DrBrowne in the interval...    Anxiety> on Tranxene as needed...    We reviewed prob list, meds, xrays and labs> see below>>  LABS 6/13:  FLP- at goals on Prav40;  Chems- wnl;  CBC- wnl;  TSH=0.71;  VitD=53...  ~  July 04, 2012:  73mo ROV & Ana Santos was seen in Nov by [place] w/ back pain- she was tried on flexeril & Percocet but proved INTOL & has found relieve via exercise & Tramadol... She has noted some symptoms c/w  RLS- intermittent but unable to rest, therefore try MIRAPEX 0.25mg  Qhs prn... Otherwise doing well w/o new complaints or concerns... Stable on her ASA/ Plavix, taking Prav40, Synthroid75, Tranxene as needed...    We reviewed prob list, meds, xrays and labs> see below for updates >>   ~  January 01, 2013:  73mo ROV & Ana Santos is due for her follow up appts in [place] [facility] but hasn't heard from DrMorris or DrBrowne & we will facilitate her f/u there... She has been under incr stress caring for her husb who had open heart surg... We reviewed the following medical problems during today's office visit >>     COPD> still smoking 1/4-1/2ppd & can't vs won't quit; not on meds, denies cough/ sputum/ hemoptysis/ SOB/ ch in DOE, etc; CXR 4/13= COPD, NAD...    Cerebrovasc dis/ Aneurysm> on ASA325, Plavix75; last f/u DrMorris IR at [facility] 8/12 w/ CTA that looked good- no resid aneurysm, distal flow appeared good & he didn't rec any changes & will f/u again in 49yrs; due now & we will refer to them...    Hyperlipid> on Prav40; tol well & FLP 7/14 shows TChol 174, TG 68, HDL 58,  LDL 103; continue same...    Hypothyroid> on Levothy75; labs 7/14 shows TSH= 2.47    DJD/ FM> she is managing quite well w/ prn Tramadol & Tylenol; prev CWP resolved...    Osteopenia> she had min osteopenia on BMD several yrs ago & takes Calcium, MVI, Vit D 1000u daily...    RLS> on Mirapex 0.25mg  Qhs as needed...     Acoustic Neuroma> this was resected by DrBrowne [facility] 2002; last MRI 2/11 continued to look good- post op changes, no recurrent tumor seen; we discussed f/u MRI here since it hasn't been done by DrBrowne in the interval...    Anxiety> on Tranxene as needed... We reviewed prob list, meds, xrays and labs> see below for updates >>   CXR 7/14> [place]...   LABS 7/14:  FLP- looks ok on Prav40;  Chems- wnl;  CBC- wnl;  TSH=2.47;  VitD=67... ADDENDUM> DrMorris ([facility], Interventional radiology) insists that we  obtain her f/u CTAngiogram & send it to him, we will also obtain f/u MRI Brain to assess her acoustic neuroma...  CTA Head 9/14 showed s/p stent assisted coiling of basilar tip aneurysm, no aneurysm recurrence, patent basilar art & left post cerebral art, mild atherosclerotic calcif in cavernous carotids w/o stenosis, post op right mastoid changes w/o recurrent tumor  MRI Brain 9/14 showed no evid of resid or recurrent vestib schwannoma on the right, post surg changes w/ scarring in the int auditory canal, ols sm vessel cerebellar infarctions are stable, mild atrophy & sm vessel dis, prev coiled basilar tip aneurysm, no acute insults...  ADDENDUM> Letter from DrMorris 9/14 after review of the CTA indicates that everything is stable and flow is satisfactory (he is leaving [place] can assist in the future if needed).  ~  July 09, 2013:  73mo ROV & Ana Santos is c/o 1wk hx palpit, skipping, & weak feeling; this occurs several times a day, lasts 30+minutes, but not assoc w/ CP/ dizzy/ syncope; she is still smoking ~1/2ppd & drinks 1-2 cups of coffee; we discussed the need for further eval w/ CXR, EKG, 2DEcho & Holter monitor; her husb sees DrMcLean for Cards... Her other CC today is chronic daily HAs- they are frontal & generalized, likely mixed musc contraction/ migraines; she has Tramadol & takes this Tid, she may add Tylenol as well, & we discussed trial of Topamax25mg - 1=>2 daily and we will refer to Neuro if not responding... We reviewed the following medical problems during today's office visit >>     COPD> still smoking 1/2ppd & can't vs won't quit; not on meds, denies cough/ sputum/ hemoptysis/ SOB/ ch in DOE, etc; CXR today 1/15= COPD, NAD; she MUST quit all smoking!    Palpit> new complaint 1/15> w/u in progress & asked to STOP SMOKING & avoid caffeine etc...     Cerebrovasc dis/ Aneurysm> on ASA325, Plavix75; last f/u DrMorris IR at [facility] 8/12 w/ CTA that looked good- no resid aneurysm, distal  flow appeared good & he didn't rec any changes; he requested that we do her f/u CTA & MRI Brain here in [place] 9/14=> good flow thru the stented basilar art, no recurrent aneurysm, no recurrent acoustic neuroma...     Hyperlipid> on Prav40; tol well & FLP 7/14 shows TChol 174, TG 68, HDL 58, LDL 103; continue same...    Hypothyroid> on Levothy75; labs 7/14 shows TSH= 2.47    DJD/ FM> she is managing quite well  w/ prn Tramadol & Tylenol; prev CWP resolved...    Osteopenia> she had min osteopenia on BMD several yrs ago & takes Calcium, MVI, Vit D 1000u daily; due for f/u BMD- pending...    RLS> on Mirapex 0.25mg  Qhs as needed...     Acoustic Neuroma> this was resected by DrBrowne [facility] 2002; last MRI 2/11 continued to look good- post op changes, no recurrent tumor seen; we discussed f/u MRI here, done 9/14 and continues to look good- no evid for recurrent or resid tumor...    Anxiety> on Tranxene as needed... We reviewed prob list, meds, xrays and labs> see below for updates >> she had the 2014 Flu vaccine 12/14...   CXR 1/15 showed norm heart size, COPD/Emphysema w/ scarring in lingula, NAD.Marland KitchenMarland Kitchen  EKG 1/15 showed NSR, rate60, poor R prog V1-3, NSSTTWA, NAD...  2DEcho 2/15 showed normal LV size & function w/ EF=55-60%, no regional wall motion abnormalities, Gr1DD, norm valves and RV...  Holter Monitor 2/15 showed NSR, PACs, PVCs, and no pathologic arrhythmias (the skips correspond to her subjective SOB/palpit)...   ~  August 12, 2013:  50mo ROV & add-on visit post hospital check>  Ana Santos developed diplopia & blurry vision and was adm to [facility] 2/15 - 07/30/13> review of records indicated that her neuro exam was otherw WNL, MRI Brain showed a right occipital lobe infarct, and MRA showed no new abnormality- no recurrent aneurysm, patent sent & good flow in the PCA; symptom resolved spont & they continued her same meds ([id number] & Plavix75); she also had a UTI treated w/ Rochephin while in the hosp... She  denies any recurrent visual prob, speech prob, focal weakness or sensory changes; she has been able to cut down to 1cig/d and again we reviewed the critical need to quit smoking completely... She notes that she was unable to tolerate the Topamax (dizzy & nausea) tried last OV for the HAs- but she notes the HAs are diminished & respond to Tramadol/ Tylenol when needed... We discussed referral to [facility] Neurology division to one of their cerebrovasc dis/ stroke specialists for their recommendations in light of her prev issues...     We reviewed prob list, meds, xrays and labs> see below for updates >> Tramadol & Mirapex refilled today per request...  CT Brain 07/27/13 [facility] showed prior basilar tip aneurysm repair, post surg changes in the right mastoid region, NAD...  MRI Brain 2/15 showed sm areas of acute infarct in right occipital lobe (new from 9/14 MRI); chr infarcts in the cerebellum bilat, basilar art stent & coiling of basilar tip aneurysm...  MRA Brain 2/15 showed no evid for recurrent basilar tip aneurysm, mild to mod narrowing of the right P1 segment, left P1 segment stented...  CDopplers 2/15 showed some plaque in the bulbs and ICAs bilat, <50% stenoses bilat noted...   EKG 2/15 showed NSR, rate63, poor R prog V1-3, NAD...  LABS 2/15 at [facility] showed> Chems- wnl w/ Cr=1.0;  CBC- wnl w/ Hg=13.5;  Abn UA w/ UTI evident...   ~  Oct 15, 2013:  39mo ROV & post ER visit> Ana Santos went to the ER 08/26/13 with another TIA characterized by diplopia & ataxia that last 45min or so; assoc mild HA, no focal neuro findings in ER; already on ASA325/ Plavix75 w/ good compliance; SEE PREV EVAL 2/15... They did CTA via ER (results below- NAD) and she was disch on same meds... She had f/u Neuro eval at [facility] 08/20/13 (prior to this ER  visit)> DrGomadam NOTE REVIEWED, they ordered CTA (subseq done via [facility]), & and Echo w/ bubble study... She denies any additional cerebral ischemic symptoms since the  3/15 ER visit; unfortunately still smoking; review of chart shows NO documented AFib in her past... She has a f/u Neuro appt in [place] in June...    She states breathing is good, still smoking 3-4cig/d, denies cough/ sput/ hemoptysis/ SOB; BP is 140/70 & she denies CP, palpit, dizzy, edema...     She remains on Pravastatin40 for her Chol; FLP 5/15 showed TChol 164, TG 120, HDL 49, LDL 91...    She is clinically stable on her Levothyroid2mcg/d but last 5/15 showed TSH= 0.25 & she is rec to decr dose to 77mcg/d (we will f/u TSH on return)... We reviewed prob list, meds, xrays and labs> see below for updates >>   EKG 3/15 showed NSR, rate67, poor R prog V1-3, otherw wnl...  CT Head 3/15 showed evid prior vertebral art aneurysm surg & post-op changes in right mastoid, NAD...  CTA Head & Neck 3/15 showed prev basilar tip aneurysm coiling & basilar stent, prior right mastoidectomy, vessels are patent & no recurrent aneurysm; neck showed apical scarring & emphysema, 40% left subclav stenosis, 40-50% bilat ICA stenoses, mild stenosis at the origin of the vertebral as well...  LABS 5/15:  FLP- at goals on Prav40;  Chems & LFTs- wnl;  TSH= 0.25 on Levothy75 & rec to decr to 57mcg/d...   ~  April 17, 2014:  48mo ROV & Ana Santos is c/o HA- sharp pains in her temples, assoc w/ occas weak spells ("don't feel like getting OOB"), poor appetite, and occas falling (no known injury); she remains on ASA325 & Plavix75> she voiced discontent w/ Neuro eval at [facility], states she never received call from her doctor (Neuro- DrHusseini);  I reviewed Care Everywhere records- summary & last note 03/11/14 from DrHusseini> extensive note reviewed, pt stable on ASA/ Plavix and no changes made, she was asked to f/u prn;  Ana Santos's BP, Chol, BS- all well controlled but she is still smoking and can't vs won't quit;  She requests 2nd opinion from local [facility] due to her temporal HAs, falling, weak spells...  We reviewed the following  medical problems during today's office visit >>     COPD> still smoking 1/2ppd & can't vs won't quit; not on meds, min cough/ sputum, denies hemoptysis/ SOB/ ch in DOE, etc; CXR 1/15= COPD, NAD; she MUST quit all smoking!    Palpit> new complaint 1/15> w/u in progress & asked to STOP SMOKING & avoid caffeine etc; Care Everywhere shows 30d Holter w/ PACs/ PVCs/ no AFib...    Cerebrovasc dis/ Aneurysm> on ASA325, Plavix75; she sees DrMorris IR at [facility] 8/12 w/ CTA that looked good- no resid aneurysm, distal flow appeared good & he didn't rec any changes; he requested that we do her f/u CTA & MRI Brain in [place] 9/14=> good flow thru the stented basilar art, no recurrent aneurysm, no recurrent acoustic neuroma;  Then she was [facility] 2/15 w/ TIA & MRI/MRA Brain showed acute infarct in right occipital lobe, otherw stable;  Subsequent f/u by [facility] Neurology- no change in meds, rec quit smoking and control of BP, BS, Chol...    Hyperlipid> on Prav40; tol well & FLP 11/15 shows TChol 168, TG 129, HDL 43, LDL 99; continue same + diet...    Hypothyroid> on Levothy50 now; labs 11/15 shows TSH= 9.91 and she is rec to increase  Synthroid back to 37mcg/d...    DJD/ FM> she is managing quite well w/ prn Tramadol & Tylenol; prev CWP resolved...    Osteopenia> she had min osteopenia on BMD several yrs ago & takes Calcium, MVI, Vit D 1000u daily; f/u BMD 2/15 showed TScores -0.6 in Spine, and -1.6 in right FemNeck; rec to stay on supplements & wt bearing exercise...    RLS> on Mirapex 0.25mg  Qhs as needed...     Acoustic Neuroma> this was resected by DrBrowne [facility] 2002; MRI 2/11 continued to look good- post op changes, no recurrent tumor seen; f/u MRI here 9/14 (& [facility] 2/15) continues to look good- no evid for recurrent or resid tumor...    Anxiety> on Tranxene as needed... We reviewed prob list, meds, xrays and labs> see below for updates >> OK Flu shot today...  LABS 11/15:  FLP- at goals on Prav40;   Chems- wnl;  CBC- wnl;  TSH=9.91;  Sed=36... PLAN>>  She desperately need to quit smoking, declines smoking cessation help;  TSH is elev on Synthroid50 & she confirms daily dosing, therefore incr back to Synthroid75;  We will refer to [facility] for 2nd opinion regarding her cerebrovasc dis, HAs, falling, weak spells...           Problem List:   COPD (ICD-496) - she has a min smoker's cough, sm amt beige sputum... ~  CXR 5/08 w/ chr changes, LLL scar, NAD. ~  CXR 7/09 showed norm hrt size, clear lungs, NAD. ~  CXR 9/10 showed NAD. ~  CXR 10/11 showed chr changes, NAD. ~  CXR 12/12 showed COPD, NAD. ~  CXR 4/13 showed normal heart size, incr interstitial markings, NAD. ~  CXR 1/15 showed norm heart size, COPD/Emphysema w/ scarring in lingula, NAD.  CIGARETTE SMOKER (ICD-305.1) - can't vs won't quit and not interested in smoking cessation help... now she states she's decr to ~1/4-1/2 ppd but can't seem to improve from there... ~  She understands the critical need to quit smoking from the COPD/Pulm, Cardiac, & Vascular/Stroke standpoints;  She declines offers for smoking cessation help, chantix, nicotine replacement, etc... ~  2/15:  She was hosp at [facility] w/ Diplopia & found to have a right occipital stroke=> she has decr smoking to <1cig/d & encouraged to quit completely!!! ~  5/15:  She is still smoking 3-4cig/d she says & I have begged her to quit, discussed nicotine replacement rx & alternatives... ~  11/15: she is still smoking <1/2ppd she says but again declines smoking cessation help...  ?MITRAL VALVE PROLAPSE (ICD-424.0) & Hx of CHEST PAIN (ICD-786.50) - she had CP in the past and a neg cath 1995...  ~  CP eval 1995 w/ cath showing min 10% luminal irreg RCA & mild MVP, norm LVF... ~  Dale was neg- no ischemia, no infarct, EF=66%... ~  baseline EKG showed NSR, NSSTTWA (poor R prog V1-V3)... ~  EKG 1/15 showed NSR, rate60, poor R prog V1-3, NSSTTWA, NAD... ~   2DEcho 2/15 showed normal LV size & function w/ EF=55-60%, no regional wall motion abnormalities, Gr1DD, norm valves and RV  PALPITATIONS >> new complaint 1/15> asked to STOP SMOKING & avoid caffeine etc. ~  EKG 1/15 showed NSR, rate60, poor R prog V1-3, NSSTTWA, NAD... ~  2DEcho 2/15 showed normal LV size & function w/ EF=55-60%, no regional wall motion abnormalities, Gr1DD, norm valves and RV... ~  Holter Monitor 2/15 showed NSR, PACs, PVCs, and no pathologic arrhythmias (the  skips correspond to her subjective SOB/palpit)...  ~  She had another 30d Holter per [facility] w/ Care Everywhere report indicating PACs & PVCs, no AFib...  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 referred to [facility] w/ Angiogram confirmation & subseq stent assisted coiling of the aneurysm 11/10... subseq f/u angiogram 2/11 showed complete angiographic obliteration of the aneurysm, & patent stent in distal basilar art (there was a new 50%stenosis in the right PCA)... on ASA 325mg /d & PLAVIX restarted 2/11 w/ new gait abn & cbll infarct on MRI. ~  she saw DrMorris [facility]- interventional neuroradiology- w/ another arteriogram performed 8/11- it showed satis appearance of the basilar aneurysm stent & coiling w/ some prolapse of the coils into the P1 segm w/ mild compromise of the post cerebral art (flow looks adeq & he rec continued ASA/ Plavix therapy & f/u 35yr). ~  She had f/u DrMorris 8/12 w/ CTA that looked good- no resid aneurysm, distal flow appeared good & he didn't rec any changes- f/u planned 45yrs. ~  7/14: she is due for her f/u CTA & we will call [facility] regarding a follow up appt w/ DrMorris.. ~  9/14:  on UTM546, Plavix75; last f/u DrMorris IR at [facility] 8/12 w/ CTA that looked good- no resid aneurysm, distal flow appeared good & he didn't rec any changes; he requested that we do her f/u CTA & MRI Brain here in [place] 9/14=> good flow thru  the stented basilar art, no recurrent aneurysm, no recurrent acoustic neuroma...  ~  CTA Head 9/14 showed s/p stent assisted coiling of basilar tip aneurysm, no aneurysm recurrence, patent basilar art & left post cerebral art, mild atherosclerotic calcif in cavernous carotids w/o stenosis, post op right mastoid changes w/o recurrent tumor. ~  2/15: Adm to [facility] w/ Dip[lopia & eval revealed right occipital lobe infarct, everything else was stable, continued on her ASA325/ Plavix75=> we will refer to [facility] Neurology for their review of her situation... ~  3/15: she had a busy March> f/u w/ Neuro in [place] then had another TIA & presented to the ER; CTA Head & Neck showed similar to prev, no recurrent aneurysm, & they disch her on same ASA325 + Plavix75... ~  9/15: she had f/u DrHusseini at [facility], note reviewed on Care Everywhere- felt to be stable & no changes made> rec to quit smoking & control BP, BS, Chol; they released her to f/u prn... ~  11/15: pt upset that Neuro didn't address her c/o temporal HAs, falling, weak spells- we will refer to [facility] per her request...   VENOUS INSUFFICIENCY (ICD-459.81) - she tells me that she saw DrKrush for vein surgery and treatment ("it's covered by my husb's insurance").  HYPERLIPIDEMIA (ICD-272.4) - now on PRAVASTATIN 40mg /d & tol well... we reviewed low chol/ low fat diet. ~  chart review shows TChol ~ 200 range in the 80's and early 90's. ~  then TChol incr to ~250 range in late 90's and Lipitor started-  ~  FLP 5/08 on Lipitor 10mg /d showed TChol 164, TG 153, HDL 45, LDL 89. ~  in 2009 c/o no energy & arm discomfort she felt was from the Lip10- therefore [place]. ~  [place] 7/09 on diet alone showed TChol 234, TG 218, HDL 40, LDL 139... rec- start Prav40. ~  FLP 3/10 on Prav40 showed TChol 182, Tg 137, HDL 47, LDL 107 ~  FLP 6/11 on  Prav40 showed TChol 162, TG 230, HDL 37, LDL 88 ~  FLP 6/12 on Prav40 showed TChol 177, TG 139, HDL 44, LDL 105... Contin  diet, take med every day. ~  FLP 6/13 on Prav40 showed TChol 151, TG 138, HDL 42, LDL 81  ~  FLP 7/14 on Prav40 showed TChol 174, TG 68, HDL 58, LDL 103 ~  FLP 5/15 on Prav40 showed TChol 164, TG 120, HDL 49, LDL 91 ~  FLP 11/15 on Prav40 showed TChol 168, TG 129, HDL 43, LDL 99   HYPOTHYROIDISM (ICD-244.9) - stable on LEVOTHYROID 57mcg/d now... hx Hashimoto's thyroiditis in 1989 w/ markedly pos antimicrosomal antibodies and transient hyperthy labs...  ~  labs 5/08 showed TSH= 0.53... rec- continue Levo100/d. ~  labs 7/09 showed TSH= 0.07... rec- decr Levothy100 to 1/2 daily. ~  labs 3/10 showed TSH= 17.71... rec> incr back to 195mcg/d. ~  labs 9/10 showed TSH= 0.21... continue same. ~  labs 6/11 showed TSH= 0.10... rec decr Levo100 to 1/2 daily. ~  labs 10/11 on Levo50 showed TSH= 1.25 (FreeT3 & FreeT4 normal) ~  Labs 6/12 on Levo50 showed TSH= 12.56 ==> we will contact pt & pharm to determine if taking Levo100-1/2tab daily every day? & adjust. ~  Labs 12/12 on Levo75 showed TSH= 0.88... Continue same. ~  Labs 6/13 on Levo75 showed TSH= 0.71... Continue same. ~  Labs 7/14 on Levo75 showed TSH= 2.47 ~  Labs 5/15 on Levo75 showed TSH= 0.25 and we rec decr to Levothy50... ~  Labs 11/15 on Levo50 showed TSH= 9.91 and she is rec to go back on Synthroid75  IRRITABLE BOWEL SYNDROME (ICD-564.1) - had colonoscopy by [facility] 11/06 that was WNL.Marland Kitchen. she notes some constipation & Rx w/ colase/ senakot-S OTC...  NEPHROLITHIASIS (ICD-592.0) - last stone passed in 1993, no known recurrence since then...  Hx of TENDINITIS, RIGHT ELBOW (ICD-727.09) Hx right shoulder pain w/ eval by DrNorris (MRI was planned butnever done & symptoms improved spont)... FIBROMYALGIA (ICD-729.1) - prev severe symptoms may have reflected a flair in her FM> but much improved on Pred Rx. ~  labs 9/10 showed Sed= 24 ~  labs 6/11 showed Sed= 30 ~  labs 10/11 showed CBC=norm, Chems=norm, TFTs=norm, Sed=21, RF=neg, ANA+1:40  speckled... ~  12/11:  pt saw DrTruslow & his note is pending- she indicates he stopped her Pred, & gave her shot in shoulder.  OSTEOPENIA (ICD-733.90) - BMD here 7/09 showed TScores -0.8 in Spine, and -1.2 in right FemNeck... rec to take Calcium, MVI, Vit D... ~  BMD repeated 2/15 & showed TScores -0.6 in Spine, and -1.6 in right FemNeck; rec to stay on Calcium, MVI, VitD, & wt bearing exercise...  VITAMIN D DEFICIENCY (ICD-268.9) ~  labs 7/09 showed Vit D level = 16... rec- start Vit D 50K weekly. ~  labs 3/10 showed Vit D level = 69... rec> change to 1000 u daily. ~  labs 6/11 showed Vit D level = 55... Continue same. ~  Labs 6/13 showed Vit D level = 53... Continue same. ~  Labs 7/14 showed Vit D level = 67  Hx of ACOUSTIC NEUROMA (ICD-225.1) - s/p surg for this tumor 10/02 at [facility] DrBrowne... no known recurrence... ~  MRI Brain 2/07 showed s/p translabyrinthine approach to right acoustic neuroma w/o recurrence, NAD... ~  MRI Brain 10/10 showed no recurrence of tumor, but 4-101mm basilar tip aneurysm detected & Rx as above. ~  MRI Brain 2/11 showed no recurrence/ post op changes, endovasc  oblit of basilar aneurysm, cerebellar lacunar infarct & 50% stenosis of right PCA... PLAVIX restarted... she had review by [facility] IR & Neurology (as above)... ~  We discussed f/u MRI since DrBrowne hasn't seen her since 2011 (?he turned her over to DrMorris)... ~  9/14:  MRI Brain showed no evid of resid or recurrent vestib schwannoma on the right, post surg changes w/ scarring in the int auditory canal, ols sm vessel cerebellar infarctions are stable, mild atrophy & sm vessel dis, prev coiled basilar tip aneurysm, no acute insults...  ~  MRI/ MRA Brain 2/15 at [facility] showed acute infarct in right occipital lobe, chronic infarcts in cerebellum bilat, aneurysm coiling of basilar art tip and stent in basilar art.. ~  CT Angio Head & Neck 3/15 in [place] showed 40% narrowing of prox left subclavian art; 40%  RICAstenosis & 02% LICAstenosis; aneurysm coiling & stenting of basilar tip aneurysm...  RLS symptoms >> she complained 1/14 about new onset RLS symptoms- discussed trial Mirapex 0.25mg  prn...  ANXIETY (ICD-300.00) - uses CHLORAZEPATE 7.5mg Tid... she gets claustrophobic and needs sedation before MRIs.   Past Surgical History  Procedure Laterality Date  . Translabyrinthine procedure  2002    [facility] Dr. Vicie Mutters  . Total abdominal hysterectomy    . Breast lumpectomy    . Aneurysm coiling  11-10    Dr. Estanislado Pandy    Outpatient Encounter Prescriptions as of 04/17/2014  Medication Sig  . acetaminophen (TYLENOL) 500 MG tablet Take 500 mg by mouth every 6 (six) hours as needed for mild pain.   Marland Kitchen aspirin 325 MG tablet Take 325 mg by mouth daily.    . Calcium Carbonate-Vitamin D (CALTRATE 600+D) 600-400 MG-UNIT per tablet Take 1 tablet by mouth 2 (two) times daily.    . cholecalciferol (VITAMIN D) 1000 UNITS tablet Take 1,000 Units by mouth daily.    . clopidogrel (PLAVIX) 75 MG tablet TAKE 1 TABLET DAILY  . clorazepate (TRANXENE) 7.5 MG tablet Take 1 tablet (7.5 mg total) by mouth 3 (three) times daily as needed for anxiety.  Marland Kitchen levothyroxine (SYNTHROID, LEVOTHROID) 50 MCG tablet TAKE 1 TABLET DAILY BEFORE BREAKFAST  . pramipexole (MIRAPEX) 0.25 MG tablet TAKE 1 TABLET AT BEDTIME FOR RESTLESS LEG SYMPTOMS  . pravastatin (PRAVACHOL) 40 MG tablet TAKE 1 TABLET DAILY  . senna-docusate (STOOL SOFTENER & LAXATIVE) 8.6-50 MG per tablet Take 2 tablets by mouth at bedtime.    . traMADol (ULTRAM) 50 MG tablet Take 1 tablet (50 mg total) by mouth 3 (three) times daily as needed for moderate pain.  . [DISCONTINUED] pramipexole (MIRAPEX) 0.25 MG tablet Take 0.25 mg by mouth at bedtime as needed (for RLS).  . [DISCONTINUED] pravastatin (PRAVACHOL) 40 MG tablet Take 40 mg by mouth at bedtime.     Allergies  Allergen Reactions  . Atorvastatin     REACTION: INTOL to Lipitor w/ arm pain (3/09)  .  Hydrocodone-Acetaminophen     REACTION: hallucinations  . Morphine     REACTION: hallucinations  . Topamax [Topiramate] Nausea And Vomiting    *dizziness*    Current Medications, Allergies, Past Medical History, Past Surgical History, Family History, and Social History were reviewed in [employer] record.   Review of Systems         See HPI - all other systems neg except as noted... The patient complains of dyspnea on exertion, recent palpitations & HAs.  The patient denies anorexia, fever, weight loss, weight gain, vision loss, decreased hearing,  hoarseness, chest pain, syncope, peripheral edema, prolonged cough, hemoptysis, abdominal pain, melena, hematochezia, severe indigestion/heartburn, hematuria, incontinence, muscle weakness, suspicious skin lesions, transient blindness, difficulty walking, depression, unusual weight change, abnormal bleeding, enlarged lymph nodes, and angioedema.     Objective:   Physical Exam     WD, WN, 73 y/o WF in NAD... GENERAL:  Alert & oriented; pleasant & cooperative... HEENT:  [place]/AT, EOM-wnl, PERRLA, EACs-clear, TMs- some scarring on right, NOSE-clear, THROAT-clear & wnl. NECK:  Supple w/ fairROM; no JVD; normal carotid impulses w/o bruits; palp thyroid, no nodules felt; no lymphadenopathy. CHEST:  Clear to P & A; without wheezes/ rales/ or rhonchi heard... HEART:  Regular Rhythm; gr1/6 SEM,  no irregularity, rubs or gallops heard... ABDOMEN:  Soft & nontender; normal bowel sounds; no organomegaly or masses detected. EXT: without deformities, mild arthritic changes; no varicose veins/ venous insuffic/ or edema... +trigger points about the neck/ shoulders w/ decr ROM right shoulder w/ pain... NEURO:  CN's intact; no focal neuro deficits... gait & station are normal. DERM:  No lesions noted; no rash etc...  RADIOLOGY DATA:  Reviewed in the EPIC EMR & discussed w/ the patient...  LABORATORY DATA:  Reviewed in the EPIC EMR &  discussed w/ the patient...   Assessment & Plan:    COPD/ Smoker>  She notes min cough/ sputum/ etc; desperately needs to quit smokng- offered counseling, medications, etc but she declines...  Hx mild MVP>  No MVP seen on 2DEcho 2/15...  PALPITATIONS>> Prev eval w/ CXR, EKG, 2DEcho & Holter monitor=> all studies OK/NEG as above (x PACs/ PVCs); we stressed the importance of no nicotine, no caffeine, etc...  Hx right sided Acoustic Neuroma- [facility]> no known recurrence to date including MRI 9/14...  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 Another TIA 3/15>  eval via ER w/ CTA Head & Neck- stable, no changes made to meds- rec to continue ASA/ Plavix, AND QUIT SMOKING.... She was released by [facility]- DrHusseini & asked to quit smoking, continue ASA/Plavix, keep BP/ BS/ Chol under control... 11/15> pt c/o HAs, falls, weakness & requests Neuro 2nd opinion at [facility]...  HYPERLIPID>  On Prav40 + diet;  FLP looks OK but needs better diet & take med Qhs...  HYPOTHYROID>  On Levothy 63mcg/d now w/ TSH= 9.91 today; so we will incr back to 13mcg/d...  IBS w/ Constip>  She uses OTC laxatives as needed & we discussed Miralax/ Senakot-S/ etc...  ORTHO>  Prev right shoulder discomfort improved spont & MRI was never done;  currently uses Tramadol Prn...  OSTEOPENIA/ Vit D defic>  Stable on Calcium, MVI, Vit D supplement; due for f/u BMD=> pending...  RLS symptoms>  trial Mirapex 0.25mg  hs prn...  Hx Chronic daily HAs>>  HAs improved spontaneously- on Tramadol & Tylenol w/ adeq relief of pain; she tried Topamax but intol w/ dizzy & nausea...  ANXIETY>  Uses Tranxene Prn (she has some claustrophobia & needs sedation before MRIs- she doesn't remember the 2/11 MRI being done!).Marland KitchenMarland Kitchen   Patient's Medications  New Prescriptions   No medications on file  Previous Medications   ACETAMINOPHEN (TYLENOL) 500 MG TABLET    Take 500 mg by mouth every 6 (six) hours as needed for mild pain.    ASPIRIN 325 MG TABLET    Take 325 mg by mouth daily.     CALCIUM CARBONATE-VITAMIN D (CALTRATE 600+D) 600-400 MG-UNIT PER TABLET    Take 1 tablet by mouth 2 (two) times daily.     CHOLECALCIFEROL (VITAMIN D) 1000 UNITS TABLET    Take 1,000 Units by mouth daily.     CLOPIDOGREL (PLAVIX) 75 MG TABLET    TAKE 1 TABLET DAILY   CLORAZEPATE (TRANXENE) 7.5 MG TABLET    Take 1 tablet (7.5 mg total) by mouth 3 (three) times daily as needed for anxiety.   LEVOTHYROXINE (SYNTHROID, LEVOTHROID) 50 MCG TABLET    TAKE 1 TABLET DAILY BEFORE BREAKFAST   PRAMIPEXOLE (MIRAPEX) 0.25 MG TABLET    TAKE 1 TABLET AT BEDTIME FOR RESTLESS LEG SYMPTOMS   PRAVASTATIN (PRAVACHOL) 40 MG TABLET    TAKE 1 TABLET DAILY   SENNA-DOCUSATE (STOOL SOFTENER & LAXATIVE) 8.6-50 MG PER TABLET    Take 2 tablets by mouth at bedtime.     TRAMADOL (ULTRAM) 50 MG TABLET    Take 1 tablet (50 mg total) by mouth 3 (three) times daily as needed for moderate pain.  Modified Medications   No medications on file  Discontinued Medications

## 2014-04-17 NOTE — Patient Instructions (Signed)
Today we updated your med list in our EPIC system...    Continue your current medications the same...  Today we rechecked your blood work...    We will contact you w/ the results when available...   We gave you the 2015 Flu shot today...  We will arrange for a Neurology consult due to your headaches, weak spells and falling...  Call for any questions...  Let's plan a follow up visit in 71mo, sooner if needed for problems.Marland KitchenMarland Kitchen

## 2014-04-23 ENCOUNTER — Other Ambulatory Visit: Payer: Self-pay | Admitting: Pulmonary Disease

## 2014-04-23 MED ORDER — LEVOTHYROXINE SODIUM 75 MCG PO TABS
75.0000 ug | ORAL_TABLET | Freq: Every day | ORAL | Status: DC
Start: 2014-04-23 — End: 2015-03-10

## 2014-04-23 NOTE — Telephone Encounter (Signed)
Called and spoke with pt and she is aware of SN recs of her lab results.  Pt is aware to increase the synthroid to 55mcg and this has been sent to the mail order per pts request.  Nothing further is needed.

## 2014-04-26 ENCOUNTER — Other Ambulatory Visit: Payer: Self-pay | Admitting: Pulmonary Disease

## 2014-04-27 ENCOUNTER — Telehealth: Payer: Self-pay | Admitting: Neurology

## 2014-04-27 NOTE — Telephone Encounter (Signed)
Dr. Lenna Gilford has been notified that pt's appt was r/s to 12/24 at [facility].

## 2014-04-30 ENCOUNTER — Telehealth: Payer: Self-pay | Admitting: Pulmonary Disease

## 2014-04-30 NOTE — Telephone Encounter (Signed)
Called and spoke with pt and she stated that she has appt on 12/24 with Dr. Eddie North stated that her headaches are getting worse and would like to have a sooner appt.  I have called the office of [facility] neurology to see if we could get a sooner appt for her.  They can see her tomorrow at 2:30---i have called the pt and she is aware and will be there for this appt.

## 2014-05-01 ENCOUNTER — Encounter: Payer: Self-pay | Admitting: Neurology

## 2014-05-01 ENCOUNTER — Ambulatory Visit (INDEPENDENT_AMBULATORY_CARE_PROVIDER_SITE_OTHER): Payer: Medicare Other | Admitting: Neurology

## 2014-05-01 VITALS — BP 130/58 | HR 78 | Temp 98.0°F | Resp 16 | Ht 62.0 in | Wt 128.2 lb

## 2014-05-01 DIAGNOSIS — I671 Cerebral aneurysm, nonruptured: Secondary | ICD-10-CM

## 2014-05-01 DIAGNOSIS — I639 Cerebral infarction, unspecified: Secondary | ICD-10-CM

## 2014-05-01 DIAGNOSIS — I725 Aneurysm of other precerebral arteries: Secondary | ICD-10-CM

## 2014-05-01 DIAGNOSIS — H9311 Tinnitus, right ear: Secondary | ICD-10-CM

## 2014-05-01 DIAGNOSIS — G459 Transient cerebral ischemic attack, unspecified: Secondary | ICD-10-CM

## 2014-05-01 DIAGNOSIS — I679 Cerebrovascular disease, unspecified: Secondary | ICD-10-CM

## 2014-05-01 DIAGNOSIS — G4485 Primary stabbing headache: Secondary | ICD-10-CM | POA: Diagnosis not present

## 2014-05-01 MED ORDER — GABAPENTIN 400 MG PO CAPS: ORAL_CAPSULE | ORAL | Status: DC

## 2014-05-01 NOTE — Progress Notes (Signed)
NEUROLOGY CONSULTATION NOTE  Ana Santos MRN: [id number] DOB: 1941/05/22  Referring provider: Dr. Lenna Gilford Primary care provider: Dr. Lenna Gilford  Reason for consult:  TIA, headache  HISTORY OF PRESENT ILLNESS: Ana Santos is a 73 year old right-handed woman with tobacco abuse, hypertension, hypothyroidism, restless leg, and history of basilar tip aneurysm status post coiling in 2010, right cerebellar infarct (incidental finding), right vestibular schwannoma status post resection 2002, and recurrent TIA.  Records, labs, and some imaging reviewed.  In 2010, she exhibited dizziness and weakness in the arms.  She was found to have a basilar tip aneurysm, which was coiled.  She has a history of recurrent stroke and TIAs.  She had a right occipital stroke on 07/27/13, presenting with diplopia, blurred vision and gait instability.  She was hospitalized and treated in [place].  MRI of the brain from 07/28/13 showed small infarcts in the right occipital lobe.  MRA showed "no recurrence of aneurysm, moderate focal narrowing R P1 segment, stented L P1 segment".  Carotid duplex showed bilateral carotid bulb and proximal ICA plaque with less than 50% stenosis.  TTE showed EF 55-60^ with mild LA dilatation.  Holter monitor showed NSR with PACs and PVCs.  She continued to have persistent dizziness.  She followed up with Dr. Marisue Brooklyn at [facility].  LDL from 08/20/13 was 85 with cholesterol of 164, TG 128 and HDL of 51.  Sed Rate was 30.  She had recurrent TIA symptoms on 08/26/13 with vertical diplopia and generalized weakness, lasting 1 hour.  She was brought to [facility] where EMS reported right dilated pupil.  Systolic blood pressure was in the 200s.  Glucose was 107-110.  CT of the brain with and without contrast and CTA of the head and neck was performed and personally reviewed, showing aneurysm tip coiling and stenting of basilar tip aneurysm with no recurrent aneurysm, no intracranial stenosis, carotid  bifurcation atherosclerotic disease with mild bilateral ICA stenosis (40%).  She followed up with Dr. Marisue Brooklyn, who ordered a TTE with bubble study, which was performed on 12/02/13.  It revealed LVEF 55-60% with no right-to-left shunt.  A 30 day cardiac monitor placed 01/11/14 showed sinus rhythm with PAC and PVC.  It was noted that her symptoms were associated with PVCs.  Labs from 04/17/14 showed LDL of 99 with cholesterol of 168, TG 129 and HDL 43.10.  Sed rate was 36.  TSH was 9.91.  Glucose was 77.  For about 3 months, she has been experiencing a new type of headache.  It is a stabbing pain just above the right eye, lasting up to one minute. There is associated lightheadedness, but no visual disturbance, focal numbness or weakness.  It occurs anywhere from once every other day to twice a day.  Since it is so brief, she doesn't take anything for it, but it is scary due to her concern of aneurysm.  She has chronic daily dull squeezing headache and takes Tramadol twice a day and sometimes Tylenol.  She has chronic tinnitus in the right ear since her vestibular schwannoma.  An MRI of the brain performed on 07/31/09 was personally reviewed, which revealed chronic lacunar infarcts in the right cerebellum, new since prior imaging on 03/24/09.  She takes ASA 325mg , Plavix 75mg , pravastatin 40mg , levothyroxine, Mirapex and tramadol.   PAST MEDICAL HISTORY: Past Medical History  Diagnosis Date  . COPD (chronic obstructive pulmonary disease)   . Cigarette smoker   . Mitral valve prolapse   .  Palpitations   . Cerebrovascular disease   . Aneurysm   . Venous insufficiency   . Hyperlipidemia   . Hypothyroidism   . IBS (irritable bowel syndrome)   . Nephrolithiasis   . Tendinitis of right elbow   . Fibromyalgia   . Osteopenia   . Vitamin D deficiency   . AN (acoustic neuroma)   . Anxiety   . Insomnia   . Coronary artery disease     PAST SURGICAL HISTORY: Past Surgical History  Procedure  Laterality Date  . Translabyrinthine procedure  2002    [facility] Dr. Vicie Mutters  . Total abdominal hysterectomy    . Breast lumpectomy    . Aneurysm coiling  11-10    Dr. Estanislado Pandy    MEDICATIONS: Current Outpatient Prescriptions on File Prior to Visit  Medication Sig Dispense Refill  . acetaminophen (TYLENOL) 500 MG tablet Take 500 mg by mouth every 6 (six) hours as needed for mild pain.     Marland Kitchen aspirin 325 MG tablet Take 325 mg by mouth daily.      . Calcium Carbonate-Vitamin D (CALTRATE 600+D) 600-400 MG-UNIT per tablet Take 1 tablet by mouth 2 (two) times daily.      . cholecalciferol (VITAMIN D) 1000 UNITS tablet Take 1,000 Units by mouth daily.      . clopidogrel (PLAVIX) 75 MG tablet TAKE 1 TABLET DAILY 90 tablet 0  . clorazepate (TRANXENE) 7.5 MG tablet Take 1 tablet (7.5 mg total) by mouth 3 (three) times daily as needed for anxiety. 90 tablet 5  . levothyroxine (SYNTHROID) 75 MCG tablet Take 1 tablet (75 mcg total) by mouth daily before breakfast. 90 tablet 3  . pramipexole (MIRAPEX) 0.25 MG tablet TAKE 1 TABLET AT BEDTIME FOR RESTLESS LEG SYMPTOMS 90 tablet 6  . pravastatin (PRAVACHOL) 40 MG tablet TAKE 1 TABLET DAILY 90 tablet 1  . senna-docusate (STOOL SOFTENER & LAXATIVE) 8.6-50 MG per tablet Take 2 tablets by mouth at bedtime.      . traMADol (ULTRAM) 50 MG tablet Take 1 tablet (50 mg total) by mouth 3 (three) times daily as needed for moderate pain. 90 tablet 5   No current facility-administered medications on file prior to visit.    ALLERGIES: Allergies  Allergen Reactions  . Atorvastatin     REACTION: INTOL to Lipitor w/ arm pain (3/09)  . Hydrocodone-Acetaminophen     REACTION: hallucinations  . Morphine     REACTION: hallucinations  . Topamax [Topiramate] Nausea And Vomiting    *dizziness*    FAMILY HISTORY: Family History  Problem Relation Age of Onset  . Heart attack Mother   . Cancer Father   . Diabetes Mother   . Diabetes Sister   . Diabetes Brother   .  Cancer Sister     LUNG  . Cancer Brother     Lung    SOCIAL HISTORY: History   Social History  . Marital Status: Married    Spouse Name: N/A    Number of Children: N/A  . Years of Education: N/A   Occupational History  . alterations    Social History Main Topics  . Smoking status: Current Every Day Smoker    Types: Cigarettes  . Smokeless tobacco: Never Used     Comment: 4-5 cigs daily  is aware she needs to quit  . Alcohol Use: No  . Drug Use: No  . Sexual Activity: No   Other Topics Concern  . Not on file   Social History  Narrative    REVIEW OF SYSTEMS: Constitutional: No fevers, chills, or sweats, no generalized fatigue, change in appetite Eyes: No visual changes, double vision, eye pain Ear, nose and throat: No hearing loss, ear pain, nasal congestion, sore throat Cardiovascular: No chest pain, palpitations Respiratory:  No shortness of breath at rest or with exertion, wheezes GastrointestinaI: No nausea, vomiting, diarrhea, abdominal pain, fecal incontinence Genitourinary:  No dysuria, urinary retention or frequency Musculoskeletal:  No neck pain, back pain Integumentary: No rash, pruritus, skin lesions Neurological: as above Psychiatric: No depression, insomnia, anxiety Endocrine: No palpitations, fatigue, diaphoresis, mood swings, change in appetite, change in weight, increased thirst Hematologic/Lymphatic:  No anemia, purpura, petechiae. Allergic/Immunologic: no itchy/runny eyes, nasal congestion, recent allergic reactions, rashes  PHYSICAL EXAM: Filed Vitals:   05/01/14 1449  BP: 130/58  Pulse: 78  Temp: 98 F (36.7 C)  Resp: 16   General: No acute distress Head:  Normocephalic/atraumatic Eyes:  fundi not able to be visualized on inspection CN III, IV, VI:  full range of motion, no nystagmus, no ptosis Neck: supple, no paraspinal tenderness, full range of motion Back: No paraspinal tenderness Heart: regular rate and rhythm Lungs: Clear to  auscultation bilaterally. Vascular: No carotid bruits. Neurological Exam: Mental status: alert and oriented to person, place, and time, recent and remote memory intact, fund of knowledge intact, attention and concentration intact, speech fluent and not dysarthric, language intact. Cranial nerves: CN I: not tested CN II: pupils equal, round and reactive to light, visual fields intact, fundi not able to be visualized on inspection CN III, IV, VI:  full range of motion, no nystagmus, no ptosis CN V: facial sensation intact CN VII: upper and lower face symmetric CN VIII: hearing intact CN IX, X: gag intact, uvula midline CN XI: sternocleidomastoid and trapezius muscles intact CN XII: tongue midline Bulk & Tone: normal, no fasciculations. Motor:  5/5 throughout Sensation:  Temperature and vibration intact Deep Tendon Reflexes:  2+ throughout, toes downgoing Finger to nose testing:  No dysmetria Gait:  Normal station and stride. Romberg negative.  IMPRESSION: Primary stabbing headache Medication overuse headache Tinnitus, right ear Cerebrovascular disease History of basilar tip aneurysm status post coiling History of right vestibular schwannoma status post resection  PLAN: Will start gabapentin 400mg  titrating to twice a day. Taper off of tramadol to no more than 2 days out of the week ASA and Plavix Smoking cessation Follow up in 3 months.  Call in 4 weeks with update.  Thank you for allowing me to take part in the care of this patient.  Metta Clines, DO  CC: Teressa Lower, MD

## 2014-05-01 NOTE — Patient Instructions (Signed)
1.  We will start gabapentin 400mg  capsules to treat the headache.  Take 1capsule at bedtime for 7 days, then increase to 1 capsule twice daily.  Call in 4 weeks with update. 2.  Limit use of all pain relievers (including tramadol and tylenol) to no more than 2 days out of the week.  I know you take tramadol daily.  Try taking it no more than 6 days out of the week for 1 week, then no more than 5 days out of the week for 1 week, then no more than 4 days out of the week for 1 week, then no more than 3 days out of the week for one week, then no more than 2 days out of the week. 3.  Try to continue working on quitting smoking 4.  Follow up in 3 months but call in 4 weeks with update.

## 2014-05-04 ENCOUNTER — Telehealth: Payer: Self-pay | Admitting: Neurology

## 2014-05-04 ENCOUNTER — Telehealth: Payer: Self-pay | Admitting: *Deleted

## 2014-05-04 NOTE — Telephone Encounter (Signed)
Gabapintin 400mg  1 po  hs for 7 days then 400mg  1 po bid called to [facility]

## 2014-05-04 NOTE — Telephone Encounter (Signed)
Pt says she checked with her pharmacy over the w/e and her headache med has not been sent in. Uses [facility] on [address] CB# [phone] / Gayleen Orem,

## 2014-05-26 ENCOUNTER — Telehealth: Payer: Self-pay | Admitting: *Deleted

## 2014-05-26 ENCOUNTER — Telehealth: Payer: Self-pay | Admitting: Pulmonary Disease

## 2014-05-26 NOTE — Telephone Encounter (Signed)
Pt is aware of SN's recommendations. She agreed and verbalized understanding.

## 2014-05-26 NOTE — Telephone Encounter (Signed)
Per SN---  Try off of the medication for a week or so to see if symptoms resolve Then could  try to restart to see for sure.  After that we can let neuro know and ask for alternatives.  thanks

## 2014-05-26 NOTE — Telephone Encounter (Signed)
Spoke with pt. Reports having gone to Neuro for headaches and they started her on Gabapentin 400mg  BID. Since starting medication, pt has been experiencing chest pain, palpitations and weakness. She called the neurology office and they told her that Gabapentin would not cause these side effects. Would like SN's recommendation about what to do.  Allergies  Allergen Reactions  . Atorvastatin     REACTION: INTOL to Lipitor w/ arm pain (3/09)  . Hydrocodone-Acetaminophen     REACTION: hallucinations  . Morphine     REACTION: hallucinations  . Topamax [Topiramate] Nausea And Vomiting    *dizziness*   SN - please advise. Thanks.

## 2014-05-26 NOTE — Telephone Encounter (Signed)
Patient called stating for 3 days she has had left sided chest pain with shortness of breathe is asking could the increase in medication cause this per Dr Tomi Likens NO  this is not a typical side efffect of Neurontin advised to go to ED asap

## 2014-05-28 ENCOUNTER — Ambulatory Visit: Payer: Medicare Other | Admitting: Neurology

## 2014-06-04 ENCOUNTER — Ambulatory Visit: Payer: Medicare Other | Admitting: Neurology

## 2014-06-08 ENCOUNTER — Other Ambulatory Visit: Payer: Self-pay | Admitting: Pulmonary Disease

## 2014-07-11 ENCOUNTER — Other Ambulatory Visit: Payer: Self-pay | Admitting: Pulmonary Disease

## 2014-07-28 DIAGNOSIS — I10 Essential (primary) hypertension: Secondary | ICD-10-CM | POA: Diagnosis not present

## 2014-07-28 DIAGNOSIS — H11153 Pinguecula, bilateral: Secondary | ICD-10-CM | POA: Diagnosis not present

## 2014-07-28 DIAGNOSIS — H35033 Hypertensive retinopathy, bilateral: Secondary | ICD-10-CM | POA: Diagnosis not present

## 2014-07-28 DIAGNOSIS — H524 Presbyopia: Secondary | ICD-10-CM | POA: Diagnosis not present

## 2014-07-28 DIAGNOSIS — H25013 Cortical age-related cataract, bilateral: Secondary | ICD-10-CM | POA: Diagnosis not present

## 2014-07-28 DIAGNOSIS — H04123 Dry eye syndrome of bilateral lacrimal glands: Secondary | ICD-10-CM | POA: Diagnosis not present

## 2014-07-28 DIAGNOSIS — H2513 Age-related nuclear cataract, bilateral: Secondary | ICD-10-CM | POA: Diagnosis not present

## 2014-07-28 DIAGNOSIS — H52223 Regular astigmatism, bilateral: Secondary | ICD-10-CM | POA: Diagnosis not present

## 2014-07-28 DIAGNOSIS — H5203 Hypermetropia, bilateral: Secondary | ICD-10-CM | POA: Diagnosis not present

## 2014-07-28 DIAGNOSIS — H18413 Arcus senilis, bilateral: Secondary | ICD-10-CM | POA: Diagnosis not present

## 2014-07-29 ENCOUNTER — Encounter: Payer: Self-pay | Admitting: Internal Medicine

## 2014-08-03 ENCOUNTER — Encounter: Payer: Self-pay | Admitting: Pulmonary Disease

## 2014-08-03 ENCOUNTER — Ambulatory Visit (INDEPENDENT_AMBULATORY_CARE_PROVIDER_SITE_OTHER): Payer: Medicare Other | Admitting: Neurology

## 2014-08-03 ENCOUNTER — Ambulatory Visit (INDEPENDENT_AMBULATORY_CARE_PROVIDER_SITE_OTHER): Payer: Medicare Other | Admitting: Pulmonary Disease

## 2014-08-03 ENCOUNTER — Telehealth: Payer: Self-pay | Admitting: Pulmonary Disease

## 2014-08-03 ENCOUNTER — Encounter: Payer: Self-pay | Admitting: Neurology

## 2014-08-03 VITALS — BP 148/86 | HR 66 | Temp 98.8°F | Resp 20 | Ht 62.0 in | Wt 127.1 lb

## 2014-08-03 DIAGNOSIS — G4485 Primary stabbing headache: Secondary | ICD-10-CM | POA: Diagnosis not present

## 2014-08-03 DIAGNOSIS — I059 Rheumatic mitral valve disease, unspecified: Secondary | ICD-10-CM

## 2014-08-03 DIAGNOSIS — I679 Cerebrovascular disease, unspecified: Secondary | ICD-10-CM | POA: Diagnosis not present

## 2014-08-03 DIAGNOSIS — I200 Unstable angina: Secondary | ICD-10-CM

## 2014-08-03 DIAGNOSIS — R079 Chest pain, unspecified: Secondary | ICD-10-CM | POA: Diagnosis not present

## 2014-08-03 DIAGNOSIS — I499 Cardiac arrhythmia, unspecified: Secondary | ICD-10-CM | POA: Diagnosis not present

## 2014-08-03 DIAGNOSIS — R002 Palpitations: Secondary | ICD-10-CM

## 2014-08-03 DIAGNOSIS — Z720 Tobacco use: Secondary | ICD-10-CM | POA: Diagnosis not present

## 2014-08-03 DIAGNOSIS — G8929 Other chronic pain: Secondary | ICD-10-CM

## 2014-08-03 DIAGNOSIS — R51 Headache: Secondary | ICD-10-CM | POA: Diagnosis not present

## 2014-08-03 DIAGNOSIS — J449 Chronic obstructive pulmonary disease, unspecified: Secondary | ICD-10-CM

## 2014-08-03 DIAGNOSIS — G44229 Chronic tension-type headache, not intractable: Secondary | ICD-10-CM

## 2014-08-03 DIAGNOSIS — F17200 Nicotine dependence, unspecified, uncomplicated: Secondary | ICD-10-CM

## 2014-08-03 DIAGNOSIS — R519 Headache, unspecified: Secondary | ICD-10-CM | POA: Insufficient documentation

## 2014-08-03 MED ORDER — METOPROLOL TARTRATE 25 MG PO TABS: 12.5000 mg | ORAL_TABLET | Freq: Two times a day (BID) | ORAL | Status: DC

## 2014-08-03 NOTE — Patient Instructions (Signed)
I do hear an irregular heart beat.  I really don't think it is related to the gabapentin, but I want you to remain off of it until it can be further evaluated. I want you to call Dr. Jeannine Kitten office immediately to let them know that you need an urgent visit to look into this. In the meantime, for the stabbing headache, try melatonin.  Start with 3mg  at bedtime.  You may slowly go up on the dose on a weekly basis, no higher than 12mg  at bedtime.  Melatonin is over the counter. Follow up in 3 months. Try to limit use of tramadol again, as chronic use contributes to the daily dull headache.

## 2014-08-03 NOTE — Telephone Encounter (Signed)
Spoke with pt. She has been scheduled with SN today at 3:00pm. Nothing further was needed.

## 2014-08-03 NOTE — Progress Notes (Signed)
NEUROLOGY FOLLOW UP OFFICE NOTE  Ana Santos [id number]  HISTORY OF PRESENT ILLNESS: Ana Santos is a 74 year old right-handed woman with tobacco abuse, hypertension, hypothyroidism, restless leg, and history of basilar tip aneurysm status post coiling in 2010, right cerebellar infarct (incidental finding), right vestibular schwannoma status post resection 2002, and recurrent TIA who follows up for primary stabbing headache and chronic tension type headache.  UPDATE: She was started on gabapentin 400mg  twice daily and was tapered down on tramadol to no more than 2 days per week.  She developed chest discomfort so she stopped the gabapentin.  She felt that the chest discomfort improved after discontinuing the gabapentin, but she continues to feel it.  She describes it as a flutter of her heart beat in her chest.  It is starting to become scary.  She denies lightheadedness or shortness of breath.  The gabapentin actually was helping with the headache.  Since she stopped it, she has had an increase in the headache and has resorted back to taking tramadol daily.  HISTORY: In 2010, she exhibited dizziness and weakness in the arms.  She was found to have a basilar tip aneurysm, which was coiled.  She has a history of recurrent stroke and TIAs.  She had a right occipital stroke on 07/27/13, presenting with diplopia, blurred vision and gait instability.  She was hospitalized and treated in [place].  MRI of the brain from 07/28/13 showed small infarcts in the right occipital lobe.  MRA showed "no recurrence of aneurysm, moderate focal narrowing R P1 segment, stented L P1 segment".  Carotid duplex showed bilateral carotid bulb and proximal ICA plaque with less than 50% stenosis.  TTE showed EF 55-60^ with mild LA dilatation.  Holter monitor showed NSR with PACs and PVCs.  She continued to have persistent dizziness.  She followed up with Dr. Marisue Brooklyn at [facility].  LDL from 08/20/13 was 85 with  cholesterol of 164, TG 128 and HDL of 51.  Sed Rate was 30.  She had recurrent TIA symptoms on 08/26/13 with vertical diplopia and generalized weakness, lasting 1 hour.  She was brought to [facility] where EMS reported right dilated pupil.  Systolic blood pressure was in the 200s.  Glucose was 107-110.  CT of the brain with and without contrast and CTA of the head and neck was performed and personally reviewed, showing aneurysm tip coiling and stenting of basilar tip aneurysm with no recurrent aneurysm, no intracranial stenosis, carotid bifurcation atherosclerotic disease with mild bilateral ICA stenosis (40%).  She followed up with Dr. Marisue Brooklyn, who ordered a TTE with bubble study, which was performed on 12/02/13.  It revealed LVEF 55-60% with no right-to-left shunt.  A 30 day cardiac monitor placed 01/11/14 showed sinus rhythm with PAC and PVC.  It was noted that her symptoms were associated with PVCs.  Labs from 04/17/14 showed LDL of 99 with cholesterol of 168, TG 129 and HDL 43.10.  Sed rate was 36.  TSH was 9.91.  Glucose was 77.  In November, she began experiencing a new type of headache.  It is a stabbing pain just above the right eye, lasting up to one minute. There is associated lightheadedness, but no visual disturbance, focal numbness or weakness.  It occurs anywhere from once every other day to twice a day.  Since it is so brief, she doesn't take anything for it, but it is scary due to her concern of aneurysm.  She has chronic  daily dull squeezing headache and takes Tramadol twice a day and sometimes Tylenol.  She has chronic tinnitus in the right ear since her vestibular schwannoma.  An MRI of the brain performed on 07/31/09 was personally reviewed, which revealed chronic lacunar infarcts in the right cerebellum, new since prior imaging on 03/24/09.  She takes ASA 325mg , Plavix 75mg , pravastatin 40mg , levothyroxine, Mirapex and tramadol  PAST MEDICAL HISTORY: Past Medical History  Diagnosis Date   . COPD (chronic obstructive pulmonary disease)   . Cigarette smoker   . Mitral valve prolapse   . Palpitations   . Cerebrovascular disease   . Aneurysm   . Venous insufficiency   . Hyperlipidemia   . Hypothyroidism   . IBS (irritable bowel syndrome)   . Nephrolithiasis   . Tendinitis of right elbow   . Fibromyalgia   . Osteopenia   . Vitamin D deficiency   . AN (acoustic neuroma)   . Anxiety   . Insomnia   . Coronary artery disease     MEDICATIONS: Current Outpatient Prescriptions on File Prior to Visit  Medication Sig Dispense Refill  . acetaminophen (TYLENOL) 500 MG tablet Take 500 mg by mouth every 6 (six) hours as needed for mild pain.     Marland Kitchen aspirin 325 MG tablet Take 325 mg by mouth daily.      Marland Kitchen aspirin 325 MG tablet Take 325 mg by mouth.    . Calcium Carb-Cholecalciferol (CALTRATE 600+D) 600-800 MG-UNIT TABS Take 1 tablet by mouth.    . Calcium Carbonate-Vitamin D (CALTRATE 600+D) 600-400 MG-UNIT per tablet Take 1 tablet by mouth 2 (two) times daily.      . cholecalciferol (VITAMIN D) 1000 UNITS tablet Take 1,000 Units by mouth daily.      . Cholecalciferol (VITAMIN D-1000 MAX ST) 1000 UNITS tablet Take 1,000 Units by mouth.    . clopidogrel (PLAVIX) 75 MG tablet TAKE 1 TABLET DAILY 90 tablet 1  . clorazepate (TRANXENE) 7.5 MG tablet Take 1 tablet (7.5 mg total) by mouth 3 (three) times daily as needed for anxiety. 90 tablet 5  . levothyroxine (SYNTHROID) 75 MCG tablet Take 1 tablet (75 mcg total) by mouth daily before breakfast. 90 tablet 3  . pramipexole (MIRAPEX) 0.25 MG tablet TAKE 1 TABLET AT BEDTIME FOR RESTLESS LEG SYMPTOMS 90 tablet 6  . pravastatin (PRAVACHOL) 40 MG tablet TAKE 1 TABLET DAILY 90 tablet 0  . senna-docusate (SENOKOT-S) 8.6-50 MG per tablet Take 1 tablet by mouth.    . senna-docusate (STOOL SOFTENER & LAXATIVE) 8.6-50 MG per tablet Take 2 tablets by mouth at bedtime.      . traMADol (ULTRAM) 50 MG tablet Take 1 tablet (50 mg total) by mouth 3  (three) times daily as needed for moderate pain. 90 tablet 5  . gabapentin (NEURONTIN) 400 MG capsule Take 1cap qhs x7d, then 1cap BID (Patient not taking: Reported on 08/03/2014) 60 capsule 0   No current facility-administered medications on file prior to visit.    ALLERGIES: Allergies  Allergen Reactions  . Atorvastatin     REACTION: INTOL to Lipitor w/ arm pain (3/09)  . Hydrocodone-Acetaminophen     REACTION: hallucinations  . Morphine     REACTION: hallucinations  . Topamax [Topiramate] Nausea And Vomiting    *dizziness*    FAMILY HISTORY: Family History  Problem Relation Age of Onset  . Heart attack Mother   . Cancer Father   . Diabetes Mother   . Diabetes Sister   .  Diabetes Brother   . Cancer Sister     LUNG  . Cancer Brother     Lung    SOCIAL HISTORY: History   Social History  . Marital Status: Married    Spouse Name: N/A  . Number of Children: N/A  . Years of Education: N/A   Occupational History  . alterations    Social History Main Topics  . Smoking status: Current Every Day Smoker    Types: Cigarettes  . Smokeless tobacco: Never Used     Comment: 4-5 cigs daily  is aware she needs to quit  . Alcohol Use: No  . Drug Use: No  . Sexual Activity: No   Other Topics Concern  . Not on file   Social History Narrative    REVIEW OF SYSTEMS: Constitutional: Fatigue Eyes: No visual changes, double vision, eye pain Ear, nose and throat: No hearing loss, ear pain, nasal congestion, sore throat Cardiovascular: Says she feels a "flutter" in her chest.  No chest pain. Respiratory:  No shortness of breath at rest or with exertion, wheezes GastrointestinaI: No nausea, vomiting, diarrhea, abdominal pain, fecal incontinence Genitourinary:  No dysuria, urinary retention or frequency Musculoskeletal:  No neck pain, back pain Integumentary: No rash, pruritus, skin lesions Neurological: as above Psychiatric: Anxiety.  No depression, insomnia Endocrine: No  palpitations, fatigue, diaphoresis, mood swings, change in appetite, change in weight, increased thirst Hematologic/Lymphatic:  No anemia, purpura, petechiae. Allergic/Immunologic: no itchy/runny eyes, nasal congestion, recent allergic reactions, rashes  PHYSICAL EXAM: Filed Vitals:   08/03/14 0839  BP: 148/86  Pulse: 66  Temp: 98.8 F (37.1 C)  Resp: 20   General: No acute distress Head:  Normocephalic/atraumatic Eyes:  Fundoscopic exam unremarkable without vessel changes, exudates, hemorrhages or papilledema. Neck: supple, no paraspinal tenderness, full range of motion Heart:  Regular rate but irregular rhythm Lungs:  Clear to auscultation bilaterally Back: No paraspinal tenderness Neurological Exam: alert and oriented to person, place, and time. Attention span and concentration intact, recent and remote memory intact, fund of knowledge intact.  Speech fluent and not dysarthric, language intact.  CN II-XII intact. Fundoscopic exam unremarkable without vessel changes, exudates, hemorrhages or papilledema.  Bulk and tone normal, muscle strength 5/5 throughout.  Sensation to light touch intact.  Deep tendon reflexes 2+ throughout, toes downgoing.  Finger to nose testing intact.  Gait normal, Romberg negative.  IMPRESSION: Primary stabbing headache Irregular heart rhythm Medication overuse headache Tinnitus, right ear Cerebrovascular disease History of basilar tip aneurysm status post coiling History of right vestibular schwannoma status post resection Tobacco abuse  I appreciate an irregular heart rhythm on my exam.  I don't think it is related to gabapentin, but I want her to remain off of it until this can be further evaluated.  PLAN: 1.  I instructed her to call Dr. Jeannine Kitten office immediately to let them know that you need an urgent visit to look into this.  I did speak with his office and they have an opening for him to see her today.  They will contact her. 2.  In the  meantime, for the stabbing headache, try melatonin.  Start with 3mg  at bedtime.  You may slowly go up on the dose on a weekly basis, no higher than 12mg  at bedtime.  Melatonin is over the counter. 3.  Follow up in 3 months. 4.  Advised to try limit use of tramadol again, as chronic use contributes to the daily dull headache. 5.  Discussed importance of  smoking cessation.  Metta Clines, DO  CC: Teressa Lower, MD

## 2014-08-03 NOTE — Telephone Encounter (Signed)
I called and spoke with Dr Loretta Plume at [phone]; he states patient had already left his office-she is having Chest discomfort, hx of MVP. Pt currently has regular rate but Dr Loretta Plume stated he heard irregular rhythm at the visit today. Dr Loretta Plume is aware that I am calling patient to get her in today to see SN as I do not see a Cardiologist in [facility] for patient and SN is out of office this morning.    I called patient's mobile number and no answer and unable to leave voicemail as box is full. I therefore called patients home number and left message for patient to call me or Leigh back today to get in to see SN at 3:00pm today. Myself or Marliss Czar will schedule patient as we need to get more information from patient as well.

## 2014-08-03 NOTE — Progress Notes (Signed)
Subjective:    Patient ID: Ana Santos, female    DOB: 1940-11-09, 74 y.o.   MRN: [id number]  HPI 74 y/o WF here for a follow up visit... she has multiple medical problems as reviewed below...  SEE PREV EPIC NOTES FOR EARLIER DATA >>  ~  November 30, 2011:  57mo ROV & prev CWP from 4/13 visit resolved back to baseline; her CC is lack of energy & she is advised to quit all smoking & incr exercise program; she is fasting for yearly blood work today, stable on meds as outlined...    COPD> still smoking 1/4-1/2ppd & can't vs won't quit; not on meds, denies cough/ sputum/ hemoptysis/ SOB/ ch in DOE, etc; CXR 4/13= COPD, NAD...    Cerebrovasc dis/ Aneurysm> last f/u DrMorris IR at [facility] 8/12 w/ CTA that looked good- no resid aneurysm, distal flow appeared good & he didn't rec any changes & will f/u again in 49yrs; she is on ASA/ Plavix & is reluctant to stop the Plavix Rx since she is doing so well...    Hyperlipid> on Prav40; tol well & FLP shows TChol 151, TG 138, HDL 42, LDL 81; continue same...    Hypothyroid> last OV her TSH was 12.6 on 95mcg/d so we incr to 75; TSH today is 0.71 & stable, continue same...    DJD/ FM> she is managing quite well w/ prn Tramadol & Tylenol; prev CWP resolved...    Osteopenia> she had min osteopenia on BMD several yrs ago & takes Calcium, MVI, Vit D 1000u daily...    Acoustic Neuroma> this was resected by DrBrowne [facility] 2002; last MRI 2/11 continued to look good- post op changes, no recurrent tumor seen; we discussed f/u MRI in 2013 here if not done by DrBrowne in the interval...    Anxiety> on Tranxene as needed...    We reviewed prob list, meds, xrays and labs> see below>>  LABS 6/13:  FLP- at goals on Prav40;  Chems- wnl;  CBC- wnl;  TSH=0.71;  VitD=53...  ~  July 04, 2012:  53mo ROV & Ana Santos was seen in Nov by [place] w/ back pain- she was tried on flexeril & Percocet but proved INTOL & has found relieve via exercise & Tramadol... She has noted some symptoms c/w  RLS- intermittent but unable to rest, therefore try MIRAPEX 0.25mg  Qhs prn... Otherwise doing well w/o new complaints or concerns... Stable on her ASA/ Plavix, taking Prav40, Synthroid75, Tranxene as needed...    We reviewed prob list, meds, xrays and labs> see below for updates >>   ~  January 01, 2013:  68mo ROV & Ana Santos is due for her follow up appts in [place] [facility] but hasn't heard from DrMorris or DrBrowne & we will facilitate her f/u there... She has been under incr stress caring for her husb who had open heart surg... We reviewed the following medical problems during today's office visit >>     COPD> still smoking 1/4-1/2ppd & can't vs won't quit; not on meds, denies cough/ sputum/ hemoptysis/ SOB/ ch in DOE, etc; CXR 4/13= COPD, NAD...    Cerebrovasc dis/ Aneurysm> on ASA325, Plavix75; last f/u DrMorris IR at [facility] 8/12 w/ CTA that looked good- no resid aneurysm, distal flow appeared good & he didn't rec any changes & will f/u again in 49yrs; due now & we will refer to them...    Hyperlipid> on Prav40; tol well & FLP 7/14 shows TChol 174, TG 68, HDL 58,  LDL 103; continue same...    Hypothyroid> on Levothy75; labs 7/14 shows TSH= 2.47    DJD/ FM> she is managing quite well w/ prn Tramadol & Tylenol; prev CWP resolved...    Osteopenia> she had min osteopenia on BMD several yrs ago & takes Calcium, MVI, Vit D 1000u daily...    RLS> on Mirapex 0.25mg  Qhs as needed...     Acoustic Neuroma> this was resected by DrBrowne [facility] 2002; last MRI 2/11 continued to look good- post op changes, no recurrent tumor seen; we discussed f/u MRI here since it hasn't been done by DrBrowne in the interval...    Anxiety> on Tranxene as needed... We reviewed prob list, meds, xrays and labs> see below for updates >>   CXR 7/14> [place]...   LABS 7/14:  FLP- looks ok on Prav40;  Chems- wnl;  CBC- wnl;  TSH=2.47;  VitD=67... ADDENDUM> DrMorris ([facility], Interventional radiology) insists that we  obtain her f/u CTAngiogram & send it to him, we will also obtain f/u MRI Brain to assess her acoustic neuroma...  CTA Head 9/14 showed s/p stent assisted coiling of basilar tip aneurysm, no aneurysm recurrence, patent basilar art & left post cerebral art, mild atherosclerotic calcif in cavernous carotids w/o stenosis, post op right mastoid changes w/o recurrent tumor  MRI Brain 9/14 showed no evid of resid or recurrent vestib schwannoma on the right, post surg changes w/ scarring in the int auditory canal, ols sm vessel cerebellar infarctions are stable, mild atrophy & sm vessel dis, prev coiled basilar tip aneurysm, no acute insults...  ADDENDUM> Letter from DrMorris 9/14 after review of the CTA indicates that everything is stable and flow is satisfactory (he is leaving [place] can assist in the future if needed).  ~  July 09, 2013:  11mo ROV & Ana Santos is c/o 1wk hx palpit, skipping, & weak feeling; this occurs several times a day, lasts 30+minutes, but not assoc w/ CP/ dizzy/ syncope; she is still smoking ~1/2ppd & drinks 1-2 cups of coffee; we discussed the need for further eval w/ CXR, EKG, 2DEcho & Holter monitor; her husb sees DrMcLean for Cards... Her other CC today is chronic daily HAs- they are frontal & generalized, likely mixed musc contraction/ migraines; she has Tramadol & takes this Tid, she may add Tylenol as well, & we discussed trial of Topamax25mg - 1=>2 daily and we will refer to Neuro if not responding... We reviewed the following medical problems during today's office visit >>     COPD> still smoking 1/2ppd & can't vs won't quit; not on meds, denies cough/ sputum/ hemoptysis/ SOB/ ch in DOE, etc; CXR today 1/15= COPD, NAD; she MUST quit all smoking!    Palpit> new complaint 1/15> w/u in progress & asked to STOP SMOKING & avoid caffeine etc...     Cerebrovasc dis/ Aneurysm> on ASA325, Plavix75; last f/u DrMorris IR at [facility] 8/12 w/ CTA that looked good- no resid aneurysm, distal  flow appeared good & he didn't rec any changes; he requested that we do her f/u CTA & MRI Brain here in [place] 9/14=> good flow thru the stented basilar art, no recurrent aneurysm, no recurrent acoustic neuroma...     Hyperlipid> on Prav40; tol well & FLP 7/14 shows TChol 174, TG 68, HDL 58, LDL 103; continue same...    Hypothyroid> on Levothy75; labs 7/14 shows TSH= 2.47    DJD/ FM> she is managing quite well  w/ prn Tramadol & Tylenol; prev CWP resolved...    Osteopenia> she had min osteopenia on BMD several yrs ago & takes Calcium, MVI, Vit D 1000u daily; due for f/u BMD- pending...    RLS> on Mirapex 0.25mg  Qhs as needed...     Acoustic Neuroma> this was resected by DrBrowne [facility] 2002; last MRI 2/11 continued to look good- post op changes, no recurrent tumor seen; we discussed f/u MRI here, done 9/14 and continues to look good- no evid for recurrent or resid tumor...    Anxiety> on Tranxene as needed... We reviewed prob list, meds, xrays and labs> see below for updates >> she had the 2014 Flu vaccine 12/14...   CXR 1/15 showed norm heart size, COPD/Emphysema w/ scarring in lingula, NAD.Marland KitchenMarland Kitchen  EKG 1/15 showed NSR, rate60, poor R prog V1-3, NSSTTWA, NAD...  2DEcho 2/15 showed normal LV size & function w/ EF=55-60%, no regional wall motion abnormalities, Gr1DD, norm valves and RV...  Holter Monitor 2/15 showed NSR, PACs, PVCs, and no pathologic arrhythmias (the skips correspond to her subjective SOB/palpit)...   ~  August 12, 2013:  50mo ROV & add-on visit post hospital check>  Ana Santos developed diplopia & blurry vision and was adm to [facility] 2/15 - 07/30/13> review of records indicated that her neuro exam was otherw WNL, MRI Brain showed a right occipital lobe infarct, and MRA showed no new abnormality- no recurrent aneurysm, patent sent & good flow in the PCA; symptom resolved spont & they continued her same meds ([id number] & Plavix75); she also had a UTI treated w/ Rochephin while in the hosp... She  denies any recurrent visual prob, speech prob, focal weakness or sensory changes; she has been able to cut down to 1cig/d and again we reviewed the critical need to quit smoking completely... She notes that she was unable to tolerate the Topamax (dizzy & nausea) tried last OV for the HAs- but she notes the HAs are diminished & respond to Tramadol/ Tylenol when needed... We discussed referral to [facility] Neurology division to one of their cerebrovasc dis/ stroke specialists for their recommendations in light of her prev issues...     We reviewed prob list, meds, xrays and labs> see below for updates >> Tramadol & Mirapex refilled today per request...  CT Brain 07/27/13 [facility] showed prior basilar tip aneurysm repair, post surg changes in the right mastoid region, NAD...  MRI Brain 2/15 showed sm areas of acute infarct in right occipital lobe (new from 9/14 MRI); chr infarcts in the cerebellum bilat, basilar art stent & coiling of basilar tip aneurysm...  MRA Brain 2/15 showed no evid for recurrent basilar tip aneurysm, mild to mod narrowing of the right P1 segment, left P1 segment stented...  CDopplers 2/15 showed some plaque in the bulbs and ICAs bilat, <50% stenoses bilat noted...   EKG 2/15 showed NSR, rate63, poor R prog V1-3, NAD...  LABS 2/15 at [facility] showed> Chems- wnl w/ Cr=1.0;  CBC- wnl w/ Hg=13.5;  Abn UA w/ UTI evident...   ~  Oct 15, 2013:  39mo ROV & post ER visit> Ana Santos went to the ER 08/26/13 with another TIA characterized by diplopia & ataxia that last 45min or so; assoc mild HA, no focal neuro findings in ER; already on ASA325/ Plavix75 w/ good compliance; SEE PREV EVAL 2/15... They did CTA via ER (results below- NAD) and she was disch on same meds... She had f/u Neuro eval at [facility] 08/20/13 (prior to this ER  visit)> DrGomadam NOTE REVIEWED, they ordered CTA (subseq done via [facility]), & and Echo w/ bubble study... She denies any additional cerebral ischemic symptoms since the  3/15 ER visit; unfortunately still smoking; review of chart shows NO documented AFib in her past... She has a f/u Neuro appt in [place] in June...    She states breathing is good, still smoking 3-4cig/d, denies cough/ sput/ hemoptysis/ SOB; BP is 140/70 & she denies CP, palpit, dizzy, edema...     She remains on Pravastatin40 for her Chol; FLP 5/15 showed TChol 164, TG 120, HDL 49, LDL 91...    She is clinically stable on her Levothyroid2mcg/d but last 5/15 showed TSH= 0.25 & she is rec to decr dose to 77mcg/d (we will f/u TSH on return)... We reviewed prob list, meds, xrays and labs> see below for updates >>   EKG 3/15 showed NSR, rate67, poor R prog V1-3, otherw wnl...  CT Head 3/15 showed evid prior vertebral art aneurysm surg & post-op changes in right mastoid, NAD...  CTA Head & Neck 3/15 showed prev basilar tip aneurysm coiling & basilar stent, prior right mastoidectomy, vessels are patent & no recurrent aneurysm; neck showed apical scarring & emphysema, 40% left subclav stenosis, 40-50% bilat ICA stenoses, mild stenosis at the origin of the vertebral as well...  LABS 5/15:  FLP- at goals on Prav40;  Chems & LFTs- wnl;  TSH= 0.25 on Levothy75 & rec to decr to 57mcg/d...   ~  April 17, 2014:  48mo ROV & Ana Santos is c/o HA- sharp pains in her temples, assoc w/ occas weak spells ("don't feel like getting OOB"), poor appetite, and occas falling (no known injury); she remains on ASA325 & Plavix75> she voiced discontent w/ Neuro eval at [facility], states she never received call from her doctor (Neuro- DrHusseini);  I reviewed Care Everywhere records- summary & last note 03/11/14 from DrHusseini> extensive note reviewed, pt stable on ASA/ Plavix and no changes made, she was asked to f/u prn;  Ana Santos's BP, Chol, BS- all well controlled but she is still smoking and can't vs won't quit;  She requests 2nd opinion from local [facility] due to her temporal HAs, falling, weak spells...  We reviewed the following  medical problems during today's office visit >>     COPD> still smoking 1/2ppd & can't vs won't quit; not on meds, min cough/ sputum, denies hemoptysis/ SOB/ ch in DOE, etc; CXR 1/15= COPD, NAD; she MUST quit all smoking!    Palpit> new complaint 1/15> w/u in progress & asked to STOP SMOKING & avoid caffeine etc; Care Everywhere shows 30d Holter w/ PACs/ PVCs/ no AFib...    Cerebrovasc dis/ Aneurysm> on ASA325, Plavix75; she sees DrMorris IR at [facility] 8/12 w/ CTA that looked good- no resid aneurysm, distal flow appeared good & he didn't rec any changes; he requested that we do her f/u CTA & MRI Brain in [place] 9/14=> good flow thru the stented basilar art, no recurrent aneurysm, no recurrent acoustic neuroma;  Then she was [facility] 2/15 w/ TIA & MRI/MRA Brain showed acute infarct in right occipital lobe, otherw stable;  Subsequent f/u by [facility] Neurology- no change in meds, rec quit smoking and control of BP, BS, Chol...    Hyperlipid> on Prav40; tol well & FLP 11/15 shows TChol 168, TG 129, HDL 43, LDL 99; continue same + diet...    Hypothyroid> on Levothy50 now; labs 11/15 shows TSH= 9.91 and she is rec to increase  Synthroid back to 65mcg/d...    DJD/ FM> she is managing quite well w/ prn Tramadol & Tylenol; prev CWP resolved...    Osteopenia> she had min osteopenia on BMD several yrs ago & takes Calcium, MVI, Vit D 1000u daily; f/u BMD 2/15 showed TScores -0.6 in Spine, and -1.6 in right FemNeck; rec to stay on supplements & wt bearing exercise...    RLS> on Mirapex 0.25mg  Qhs as needed...     Acoustic Neuroma> this was resected by DrBrowne [facility] 2002; MRI 2/11 continued to look good- post op changes, no recurrent tumor seen; f/u MRI here 9/14 (& [facility] 2/15) continues to look good- no evid for recurrent or resid tumor...    Anxiety> on Tranxene as needed... We reviewed prob list, meds, xrays and labs> see below for updates >> OK Flu shot today...  LABS 11/15:  FLP- at goals on Prav40;   Chems- wnl;  CBC- wnl;  TSH=9.91;  Sed=36... PLAN>>  She desperately need to quit smoking, declines smoking cessation help;  TSH is elev on Synthroid50 & she confirms daily dosing, therefore incr back to Synthroid75;  We will refer to [facility] for 2nd opinion regarding her cerebrovasc dis, HAs, falling, weak spells...   ~  August 03, 2014:  71mo ROV & acute add-on visit requested by Neuro-DrJaffe due to CP & irreg heartbeat> Ana Santos was at [facility] office today (for f/u of headaches- improved w/ Neurontin but intol she said due to incr palpit) & mentioned left CP & palpit, he noted skips on exam & called for a work-in appt here;  She has a cardiac hx- followed for yrs by DrStuckey w/ cath 1995 showing min nonobstructive CAD & mild MVP; she developed palpit in 2015 w/ abn EKG (poor R prog, NSTTWA)) and 2DEcho showing norm LVF, no regional wall motion abn, Gr1DD, normal valves & RV; Holter monitor revealed NSR w/ some PACs & PVCs but no dangerous arrhythmias- she was asked to avoid caffeine, pseudophed, and QUIT SMOKING (she still smokes ~1/2ppd)... As noted she has severe cerebrovasc dis & hx Acoustic Neuroma w/ prev gamma knife surg at [facility] in 2002; a 30d Event monitor at [facility] was said to reveal PACs, PVCs, but no AFib... Current symptoms sound similar to prev w/ skipping sensation, no racing, sl SOB, mild chest discomfort on & off daily x 3-4wks (sharp, not sore to touch, sl worse w/ movement); she does not exercise & her most strenuous activity is housework...    We reviewed prob list, meds, xrays and labs> see below for updates >>   EKG today showed NSR, rate80, occ PVCs noted, poor R prog V1=>3, NSSTTWA... PLAN>>  We decided to start METOPROLOL 25mg - 1/2 tab Bid for now & refer to Cards for f/u eval; reminded about smoking cessation but she declines additional counseling; her husb sees DrMcLean...          Problem List:   COPD (ICD-496) - she has a min smoker's cough, sm amt beige sputum... ~   CXR 5/08 w/ chr changes, LLL scar, NAD. ~  CXR 7/09 showed norm hrt size, clear lungs, NAD. ~  CXR 9/10 showed NAD. ~  CXR 10/11 showed chr changes, NAD. ~  CXR 12/12 showed COPD, NAD. ~  CXR 4/13 showed normal heart size, incr interstitial markings, NAD. ~  CXR 1/15 showed norm heart size, COPD/Emphysema w/ scarring in lingula, NAD.  CIGARETTE SMOKER (ICD-305.1) - can't vs won't quit and not interested in smoking cessation help.Marland KitchenMarland Kitchen  now she states she's decr to ~1/4-1/2 ppd but can't seem to improve from there... ~  She understands the critical need to quit smoking from the COPD/Pulm, Cardiac, & Vascular/Stroke standpoints;  She declines offers for smoking cessation help, chantix, nicotine replacement, etc... ~  2/15:  She was hosp at [facility] w/ Diplopia & found to have a right occipital stroke=> she has decr smoking to <1cig/d & encouraged to quit completely!!! ~  5/15 to 11/15:  She is still smoking 3-4cig/d she says & I have begged her to quit, discussed nicotine replacement rx & alternatives... ~  2/16: she is still smoking <1/2ppd she says but again declines smoking cessation help...  ?MITRAL VALVE PROLAPSE (ICD-424.0) >> mild MVP seen on cath 1995 by DrStuckey... Hx of CHEST PAIN (ICD-786.50) >> she had CP in the past and a neg cath 1995 (min coronary irregularities)...  Hx PALPITATIONS >> PVCs and PACs seen on EKG & Holter monitor in 2015; asked to stop all caffeine & nicotine... ~  CP eval 1995 w/ cath showing min 10% luminal irreg RCA & mild MVP, norm LVF... ~  [place] was neg- no ischemia, no infarct, EF=66%... ~  baseline EKG showed NSR, NSSTTWA (poor R prog V1-V3)... ~  EKG 1/15 showed NSR, rate60, poor R prog V1-3, NSSTTWA, NAD... ~  2DEcho 2/15 showed normal LV size & function w/ EF=55-60%, no regional wall motion abnormalities, Gr1DD, norm valves and RV ~  Holter Monitor 2/15 showed NSR, PACs, PVCs, and no pathologic arrhythmias (the skips correspond to her  subjective SOB/palpit)...  ~  She had another 30d Holter per [facility] w/ Care Everywhere report indicating PACs & PVCs, no AFib... ~  EKG 2/16 showed NSR, rate80, PVCs noted, poor R prog V1=>3, NSSTTWA... She noted incr palpit & CP => referred to Cards for their review...  CEREBROVASCULAR DISEASE (ICD-437.9), & ANEURYSM (ICD-442.9) - found to have a 4-47mm basilar tip aneurysm on MRI Br 10/10... referred to [facility] w/ Angiogram confirmation & subseq stent assisted coiling of the aneurysm 11/10... subseq f/u angiogram 2/11 showed complete angiographic obliteration of the aneurysm, & patent stent in distal basilar art (there was a new 50%stenosis in the right PCA)... on ASA 325mg /d & PLAVIX restarted 2/11 w/ new gait abn & cbll infarct on MRI. ~  she saw DrMorris [facility]- interventional neuroradiology- w/ another arteriogram performed 8/11- it showed satis appearance of the basilar aneurysm stent & coiling w/ some prolapse of the coils into the P1 segm w/ mild compromise of the post cerebral art (flow looks adeq & he rec continued ASA/ Plavix therapy & f/u 15yr). ~  She had f/u DrMorris 8/12 w/ CTA that looked good- no resid aneurysm, distal flow appeared good & he didn't rec any changes- f/u planned 35yrs. ~  7/14: she is due for her f/u CTA & we will call [facility] regarding a follow up appt w/ DrMorris.. ~  9/14:  on HQP591, Plavix75; last f/u DrMorris IR at [facility] 8/12 w/ CTA that looked good- no resid aneurysm, distal flow appeared good & he didn't rec any changes; he requested that we do her f/u CTA & MRI Brain here in [place] 9/14=> good flow thru the stented basilar art, no recurrent aneurysm, no recurrent acoustic neuroma...  ~  CTA Head 9/14 showed s/p stent assisted coiling of basilar tip aneurysm, no aneurysm recurrence, patent basilar art & left post cerebral art, mild atherosclerotic calcif in cavernous carotids w/o stenosis, post op right mastoid changes  w/o recurrent tumor. ~  2/15: Adm to [facility] w/ Dip[lopia & eval revealed right occipital lobe infarct, everything else was stable, continued on her ASA325/ Plavix75=> we will refer to [facility] for their review of her situation... ~  3/15: she had a busy March> f/u w/ Neuro in [place] then had another TIA & presented to the ER; CTA Head & Neck showed similar to prev, no recurrent aneurysm, & they disch her on same ASA325 + Plavix75... ~  9/15: she had f/u DrHusseini at [facility], note reviewed on Care Everywhere- felt to be stable & no changes made> rec to quit smoking & control BP, BS, Chol; they released her to f/u prn... ~  11/15: pt upset that Neuro didn't address her c/o temporal HAs, falling, weak spells- we will refer to [facility] per her request...   VENOUS INSUFFICIENCY (ICD-459.81) - she tells me that she saw DrKrush for vein surgery and treatment ("it's covered by my husb's insurance").  HYPERLIPIDEMIA (ICD-272.4) - now on PRAVASTATIN 40mg /d & tol well... we reviewed low chol/ low fat diet. ~  chart review shows TChol ~ 200 range in the 80's and early 90's. ~  then TChol incr to ~250 range in late 90's and Lipitor started-  ~  FLP 5/08 on Lipitor 10mg /d showed TChol 164, TG 153, HDL 45, LDL 89. ~  in 2009 c/o no energy & arm discomfort she felt was from the Lip10- therefore DC'd. ~  [place] 7/09 on diet alone showed TChol 234, TG 218, HDL 40, LDL 139... rec- start Prav40. ~  FLP 3/10 on Prav40 showed TChol 182, Tg 137, HDL 47, LDL 107 ~  FLP 6/11 on Prav40 showed TChol 162, TG 230, HDL 37, LDL 88 ~  FLP 6/12 on Prav40 showed TChol 177, TG 139, HDL 44, LDL 105... Contin diet, take med every day. ~  FLP 6/13 on Prav40 showed TChol 151, TG 138, HDL 42, LDL 81  ~  FLP 7/14 on Prav40 showed TChol 174, TG 68, HDL 58, LDL 103 ~  FLP 5/15 on Prav40 showed TChol 164, TG 120, HDL 49, LDL 91 ~  FLP 11/15 on Prav40 showed TChol 168, TG 129, HDL 43, LDL 99   HYPOTHYROIDISM (ICD-244.9) - stable on LEVOTHYROID 13mcg/d now... hx Hashimoto's  thyroiditis in 1989 w/ markedly pos antimicrosomal antibodies and transient hyperthy labs...  ~  labs 5/08 showed TSH= 0.53... rec- continue Levo100/d. ~  labs 7/09 showed TSH= 0.07... rec- decr Levothy100 to 1/2 daily. ~  labs 3/10 showed TSH= 17.71... rec> incr back to 119mcg/d. ~  labs 9/10 showed TSH= 0.21... continue same. ~  labs 6/11 showed TSH= 0.10... rec decr Levo100 to 1/2 daily. ~  labs 10/11 on Levo50 showed TSH= 1.25 (FreeT3 & FreeT4 normal) ~  Labs 6/12 on Levo50 showed TSH= 12.56 ==> we will contact pt & pharm to determine if taking Levo100-1/2tab daily every day? & adjust. ~  Labs 12/12 on Levo75 showed TSH= 0.88... Continue same. ~  Labs 6/13 on Levo75 showed TSH= 0.71... Continue same. ~  Labs 7/14 on Levo75 showed TSH= 2.47 ~  Labs 5/15 on Levo75 showed TSH= 0.25 and we rec decr to Levothy50... ~  Labs 11/15 on Levo50 showed TSH= 9.91 and she is rec to go back on Synthroid75  IRRITABLE BOWEL SYNDROME (ICD-564.1) - had colonoscopy by [facility] 11/06 that was WNL.Marland Kitchen. she notes some constipation & Rx w/ colase/ senakot-S OTC...  NEPHROLITHIASIS (ICD-592.0) - last stone passed in  1993, no known recurrence since then...  Hx of TENDINITIS, RIGHT ELBOW (ICD-727.09) Hx right shoulder pain w/ eval by DrNorris (MRI was planned butnever done & symptoms improved spont)... FIBROMYALGIA (ICD-729.1) - prev severe symptoms may have reflected a flair in her FM> but much improved on Pred Rx. ~  labs 9/10 showed Sed= 24 ~  labs 6/11 showed Sed= 30 ~  labs 10/11 showed CBC=norm, Chems=norm, TFTs=norm, Sed=21, RF=neg, ANA+1:40 speckled... ~  12/11:  pt saw DrTruslow & his note is pending- she indicates he stopped her Pred, & gave her shot in shoulder.  OSTEOPENIA (ICD-733.90) - BMD here 7/09 showed TScores -0.8 in Spine, and -1.2 in right FemNeck... rec to take Calcium, MVI, Vit D... ~  BMD repeated 2/15 & showed TScores -0.6 in Spine, and -1.6 in right FemNeck; rec to stay on Calcium,  MVI, VitD, & wt bearing exercise...  VITAMIN D DEFICIENCY (ICD-268.9) ~  labs 7/09 showed Vit D level = 16... rec- start Vit D 50K weekly. ~  labs 3/10 showed Vit D level = 69... rec> change to 1000 u daily. ~  labs 6/11 showed Vit D level = 55... Continue same. ~  Labs 6/13 showed Vit D level = 53... Continue same. ~  Labs 7/14 showed Vit D level = 67  Hx of ACOUSTIC NEUROMA (ICD-225.1) - s/p surg for this tumor 10/02 at [facility]... no known recurrence... ~  MRI Brain 2/07 showed s/p translabyrinthine approach to right acoustic neuroma w/o recurrence, NAD... 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 ~  MRI Brain 2/11 showed no recurrence/ post op changes, endovasc oblit of basilar aneurysm, cerebellar lacunar infarct & 50% stenosis of right PCA... PLAVIX restarted... she had review by [facility] & Neurology (as above)... ~  We discussed f/u MRI since DrBrowne hasn't seen her since 2011 (?he turned her over to DrMorris)... ~  9/14:  MRI Brain showed no evid of resid or recurrent vestib schwannoma on the right, post surg changes w/ scarring in the int auditory canal, ols sm vessel cerebellar infarctions are stable, mild atrophy & sm vessel dis, prev coiled basilar tip aneurysm, no acute insults...  ~  MRI/ MRA Brain 2/15 at [facility] showed acute infarct in right occipital lobe, chronic infarcts in cerebellum bilat, aneurysm coiling of basilar art tip and stent in basilar art.. ~  CT Angio Head & Neck 3/15 in [place] showed 40% narrowing of prox left subclavian art; 40% RICAstenosis & 36% LICAstenosis; aneurysm coiling & stenting of basilar tip aneurysm...  RLS symptoms >> she complained 1/14 about new onset RLS symptoms- discussed trial Mirapex 0.25mg  prn...  ANXIETY (ICD-300.00) - uses CHLORAZEPATE 7.5mg Tid... she gets claustrophobic and needs sedation before MRIs.   Past Surgical History  Procedure Laterality Date  .  Translabyrinthine procedure  2002    [facility] Dr. Vicie Mutters  . Total abdominal hysterectomy    . Breast lumpectomy    . Aneurysm coiling  11-10    Dr. Estanislado Pandy    Outpatient Encounter Prescriptions as of 08/03/2014  Medication Sig  . acetaminophen (TYLENOL) 500 MG tablet Take 500 mg by mouth every 6 (six) hours as needed for mild pain.   Marland Kitchen aspirin 325 MG tablet Take 325 mg by mouth daily.    Marland Kitchen aspirin 325 MG tablet Take 325 mg by mouth.  . Calcium Carb-Cholecalciferol (CALTRATE 600+D) 600-800 MG-UNIT TABS Take 1 tablet by mouth.  Marland Kitchen  Calcium Carbonate-Vitamin D (CALTRATE 600+D) 600-400 MG-UNIT per tablet Take 1 tablet by mouth 2 (two) times daily.    . cholecalciferol (VITAMIN D) 1000 UNITS tablet Take 1,000 Units by mouth daily.    . Cholecalciferol (VITAMIN D-1000 MAX ST) 1000 UNITS tablet Take 1,000 Units by mouth.  . clopidogrel (PLAVIX) 75 MG tablet TAKE 1 TABLET DAILY  . clorazepate (TRANXENE) 7.5 MG tablet Take 1 tablet (7.5 mg total) by mouth 3 (three) times daily as needed for anxiety.  . gabapentin (NEURONTIN) 400 MG capsule Take 1cap qhs x7d, then 1cap BID (Patient not taking: Reported on 08/03/2014)  . levothyroxine (SYNTHROID) 75 MCG tablet Take 1 tablet (75 mcg total) by mouth daily before breakfast.  . pramipexole (MIRAPEX) 0.25 MG tablet TAKE 1 TABLET AT BEDTIME FOR RESTLESS LEG SYMPTOMS  . pravastatin (PRAVACHOL) 40 MG tablet TAKE 1 TABLET DAILY  . senna-docusate (SENOKOT-S) 8.6-50 MG per tablet Take 1 tablet by mouth.  . senna-docusate (STOOL SOFTENER & LAXATIVE) 8.6-50 MG per tablet Take 2 tablets by mouth at bedtime.    . traMADol (ULTRAM) 50 MG tablet Take 1 tablet (50 mg total) by mouth 3 (three) times daily as needed for moderate pain.    Allergies  Allergen Reactions  . Atorvastatin     REACTION: INTOL to Lipitor w/ arm pain (3/09)  . Hydrocodone-Acetaminophen     REACTION: hallucinations  . Morphine     REACTION: hallucinations  . Topamax [Topiramate] Nausea And  Vomiting    *dizziness*    Current Medications, Allergies, Past Medical History, Past Surgical History, Family History, and Social History were reviewed in [employer] record.   Review of Systems         See HPI - all other systems neg except as noted... The patient complains of dyspnea on exertion, recent palpitations & HAs.  The patient denies anorexia, fever, weight loss, weight gain, vision loss, decreased hearing, hoarseness, chest pain, syncope, peripheral edema, prolonged cough, hemoptysis, abdominal pain, melena, hematochezia, severe indigestion/heartburn, hematuria, incontinence, muscle weakness, suspicious skin lesions, transient blindness, difficulty walking, depression, unusual weight change, abnormal bleeding, enlarged lymph nodes, and angioedema.     Objective:   Physical Exam     WD, WN, 74 y/o WF in NAD... GENERAL:  Alert & oriented; pleasant & cooperative... HEENT:  [place]/AT, EOM-wnl, PERRLA, EACs-clear, TMs- some scarring on right, NOSE-clear, THROAT-clear & wnl. NECK:  Supple w/ fairROM; no JVD; normal carotid impulses w/o bruits; palp thyroid, no nodules felt; no lymphadenopathy. CHEST:  Clear to P & A; without wheezes/ rales/ or rhonchi heard... HEART:  Regular Rhythm; gr1/6 SEM,  no irregularity, rubs or gallops heard... ABDOMEN:  Soft & nontender; normal bowel sounds; no organomegaly or masses detected. EXT: without deformities, mild arthritic changes; no varicose veins/ venous insuffic/ or edema... +trigger points about the neck/ shoulders w/ decr ROM right shoulder w/ pain... NEURO:  CN's intact; no focal neuro deficits... gait & station are normal. DERM:  No lesions noted; no rash etc...  RADIOLOGY DATA:  Reviewed in the EPIC EMR & discussed w/ the patient...  LABORATORY DATA:  Reviewed in the EPIC EMR & discussed w/ the patient...   Assessment & Plan:    COPD/ Smoker>  She notes min cough/ sputum/ etc; desperately needs to quit  smokng- offered counseling, medications, etc but she declines...  Hx mild MVP>  No MVP seen on 2DEcho 2/15, mild MVP was noted on cath 1995 by DrStuckey...  PALPITATIONS>> Prev eval w/  CXR, EKG, 2DEcho & Holter monitor=> all studies OK/NEG as above (x PACs/ PVCs); we stressed the importance of no nicotine, no caffeine, etc... 2/16> she presented w/ incr symptoms over the last month; rec to start Metop25- 1/2Bid 7 refer to Cards for their review...  Hx right sided Acoustic Neuroma- s/p surg 2002 at [facility] DrBrowne> no known recurrence to date including MRI 9/14...  CEREBROVASC Dis w/ basilar tip aneurysm stent & coiling 11/10 by DrDeveshwar; f/u arteriogram 2/11 at [facility] & 8/11 at [facility] showed some compromise of the post circ w/ prolapse of the coils into the P1 segm;  Note: MRI 2/11 done for new gait abn showed cbll infarct & ASA/ Plavix restarted and she did home phys therapy w/ improvement;  Repeat IR eval 8/12 w/ CTA showed oblit of the aneurysm & distal flow appears good as well- they rec repeat studies in 2 yrs=> Done 9/14 in [place] & no aneurysm seen, stent ok w/ good flow thru the area, felt to be stable... 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 Another TIA 3/15>  eval via ER w/ CTA Head & Neck- stable, no changes made to meds- rec to continue ASA/ Plavix, AND QUIT SMOKING.... She was released by [facility] Neuro- DrHusseini & asked to quit smoking, continue ASA/Plavix, keep BP/ BS/ Chol under control... 11/15> pt c/o HAs, falls, weakness & requests Neuro 2nd opinion at [facility]...  HYPERLIPID>  On Prav40 + diet;  FLP looks OK but needs better diet & take med Qhs...  HYPOTHYROID>  Back on Levothy 34mcg/d now w/ TSH= 9.91 last OV on 91mcg/d...  IBS w/ Constip>  She uses OTC laxatives as needed & we discussed Miralax/ Senakot-S/ etc...  ORTHO>  Prev right shoulder discomfort improved spont & MRI  was never done; currently uses Tramadol Prn...  OSTEOPENIA/ Vit D defic>  Stable on Calcium, MVI, Vit D supplement; due for f/u BMD=> pending...  RLS symptoms>  trial Mirapex 0.25mg  hs prn...  Hx Chronic daily HAs>>  HAs improved spontaneously- on Tramadol & Tylenol w/ some relief of pain; she tried Topamax but intol w/ dizzy & nausea... 11/15> presented w/ incr HAs and referred to Neuro; they tried Neurontin w/ decr HAs but intol she says...  ANXIETY>  Uses Tranxene Prn (she has some claustrophobia & needs sedation before MRIs- she doesn't remember the 2/11 MRI being done!).Marland KitchenMarland Kitchen   Patient's Medications  New Prescriptions   METOPROLOL TARTRATE (LOPRESSOR) 25 MG TABLET    Take 0.5 tablets (12.5 mg total) by mouth 2 (two) times daily.  Previous Medications   ACETAMINOPHEN (TYLENOL) 500 MG TABLET    Take 500 mg by mouth every 6 (six) hours as needed for mild pain.    ASPIRIN 325 MG TABLET    Take 325 mg by mouth daily.     CALCIUM GLUCONATE 500 MG TABLET    Take 1 tablet by mouth daily.   CHOLECALCIFEROL (VITAMIN D) 1000 UNITS TABLET    Take 1,000 Units by mouth daily.     CLOPIDOGREL (PLAVIX) 75 MG TABLET    TAKE 1 TABLET DAILY   CLORAZEPATE (TRANXENE) 7.5 MG TABLET    Take 1 tablet (7.5 mg total) by mouth 3 (three) times daily as needed for anxiety.   LEVOTHYROXINE (SYNTHROID) 75 MCG TABLET    Take 1 tablet (75 mcg total) by mouth daily before breakfast.   PRAMIPEXOLE (MIRAPEX) 0.25 MG  TABLET    TAKE 1 TABLET AT BEDTIME FOR RESTLESS LEG SYMPTOMS   PRAVASTATIN (PRAVACHOL) 40 MG TABLET    TAKE 1 TABLET DAILY   SENNA-DOCUSATE (SENOKOT-S) 8.6-50 MG PER TABLET    Take 1 tablet by mouth at bedtime.    TRAMADOL (ULTRAM) 50 MG TABLET    Take 1 tablet (50 mg total) by mouth 3 (three) times daily as needed for moderate pain.  Modified Medications   No medications on file  Discontinued Medications   ASPIRIN 325 MG TABLET    Take 325 mg by mouth.   CALCIUM CARB-CHOLECALCIFEROL (CALTRATE 600+D)  600-800 MG-UNIT TABS    Take 1 tablet by mouth daily.    CALCIUM CARBONATE-VITAMIN D (CALTRATE 600+D) 600-400 MG-UNIT PER TABLET    Take 1 tablet by mouth 2 (two) times daily.     CHOLECALCIFEROL (VITAMIN D-1000 MAX ST) 1000 UNITS TABLET    Take 1,000 Units by mouth.   GABAPENTIN (NEURONTIN) 400 MG CAPSULE    Take 1cap qhs x7d, then 1cap BID   SENNA-DOCUSATE (STOOL SOFTENER & LAXATIVE) 8.6-50 MG PER TABLET    Take 2 tablets by mouth at bedtime.

## 2014-08-03 NOTE — Patient Instructions (Signed)
Today we updated your med list in our EPIC system...    Continue your current medications the same...  We decided to ADD METOPROLOL 25mg - 1/2 tab twice daily for your CP & palpitations...  We will arrange for a Cardiology follow up appt...   Keep your prev scheduled follow up appt w/ me on 10/16/14.Marland KitchenMarland Kitchen

## 2014-08-04 ENCOUNTER — Ambulatory Visit (INDEPENDENT_AMBULATORY_CARE_PROVIDER_SITE_OTHER): Payer: Medicare Other | Admitting: Cardiovascular Disease

## 2014-08-04 ENCOUNTER — Encounter: Payer: Self-pay | Admitting: Cardiovascular Disease

## 2014-08-04 ENCOUNTER — Ambulatory Visit
Admission: RE | Admit: 2014-08-04 | Discharge: 2014-08-04 | Disposition: A | Discharge status: Home / Self Care | Payer: Medicare Other | Source: Ambulatory Visit | Attending: Cardiovascular Disease | Admitting: Cardiovascular Disease

## 2014-08-04 ENCOUNTER — Encounter: Payer: Self-pay | Admitting: *Deleted

## 2014-08-04 VITALS — BP 150/82 | HR 68 | Ht 62.0 in | Wt 125.8 lb

## 2014-08-04 DIAGNOSIS — I200 Unstable angina: Secondary | ICD-10-CM | POA: Diagnosis not present

## 2014-08-04 DIAGNOSIS — Z01810 Encounter for preprocedural cardiovascular examination: Secondary | ICD-10-CM | POA: Diagnosis not present

## 2014-08-04 DIAGNOSIS — Z01812 Encounter for preprocedural laboratory examination: Secondary | ICD-10-CM | POA: Diagnosis not present

## 2014-08-04 DIAGNOSIS — R0602 Shortness of breath: Secondary | ICD-10-CM | POA: Diagnosis not present

## 2014-08-04 DIAGNOSIS — J4489 Other specified chronic obstructive pulmonary disease: Secondary | ICD-10-CM

## 2014-08-04 DIAGNOSIS — J449 Chronic obstructive pulmonary disease, unspecified: Secondary | ICD-10-CM | POA: Diagnosis not present

## 2014-08-04 DIAGNOSIS — R05 Cough: Secondary | ICD-10-CM | POA: Diagnosis not present

## 2014-08-04 DIAGNOSIS — I493 Ventricular premature depolarization: Secondary | ICD-10-CM

## 2014-08-04 LAB — CBC
HCT: 40.7 % (ref 36.0–46.0)
HEMOGLOBIN: 13.5 g/dL (ref 12.0–15.0)
MCHC: 33.3 g/dL (ref 30.0–36.0)
MCV: 87.4 fl (ref 78.0–100.0)
Platelets: 308 10*3/uL (ref 150.0–400.0)
RBC: 4.66 Mil/uL (ref 3.87–5.11)
RDW: 13.4 % (ref 11.5–15.5)
WBC: 10.6 10*3/uL — AB (ref 4.0–10.5)

## 2014-08-04 LAB — PROTIME-INR
INR: 0.9 ratio (ref 0.8–1.0)
Prothrombin Time: 10.4 s (ref 9.6–13.1)

## 2014-08-04 LAB — BASIC METABOLIC PANEL
BUN: 13 mg/dL (ref 6–23)
CALCIUM: 9.9 mg/dL (ref 8.4–10.5)
CO2: 29 mEq/L (ref 19–32)
Chloride: 109 mEq/L (ref 96–112)
Creatinine, Ser: 0.65 mg/dL (ref 0.40–1.20)
GFR: 94.81 mL/min (ref >60.00)
GLUCOSE: 93 mg/dL (ref 70–99)
Potassium: 4.3 mEq/L (ref 3.5–5.1)
Sodium: 142 mEq/L (ref 135–145)

## 2014-08-04 NOTE — Assessment & Plan Note (Signed)
Given the associated anginal symptoms, the priority is to exclude ischemic heart disease. Continue treatment with metoprolol.

## 2014-08-04 NOTE — Progress Notes (Signed)
Primary care physician: Dr. Lenna Gilford  HPI  This is a pleasant 74 year old female who was referred for evaluation of chest pain and shortness of breath. She has seen Dr. Lia Foyer in the 90s for chest pain. She had cardiac catheterization done which showed no evidence of obstructive coronary artery disease. She was told about mitral valve prolapse. Most recent echocardiogram in February 2015 showed normal LV systolic function with no significant valvular abnormalities. She has known history of hypertension, previous stroke status post aneurysm coiling currently on dual antiplatelet therapy, hyperlipidemia, tobacco use, hypertension and family history of coronary artery disease. She is not diabetic. Over the last month, she has experienced recurrent palpitations described as skipping every fourth or fifth beat. Around the same time, she started having substernal chest tightness with no radiation. This happens with activities and occasionally at rest and has been getting worse. She has noticed worsening exertional dyspnea and overall fatigue.  Allergies  Allergen Reactions  . Atorvastatin Other (See Comments)    REACTION: INTOL to Lipitor w/ arm pain (3/09)  . Hydrocodone-Acetaminophen Other (See Comments)    REACTION: hallucinations  . Morphine Other (See Comments)    REACTION: hallucinations  . Topamax [Topiramate] Nausea And Vomiting    *dizziness*     Current Outpatient Prescriptions on File Prior to Visit  Medication Sig Dispense Refill  . acetaminophen (TYLENOL) 500 MG tablet Take 500 mg by mouth every 6 (six) hours as needed for mild pain.     Marland Kitchen aspirin 325 MG tablet Take 325 mg by mouth daily.      . cholecalciferol (VITAMIN D) 1000 UNITS tablet Take 1,000 Units by mouth daily.      . clopidogrel (PLAVIX) 75 MG tablet TAKE 1 TABLET DAILY 90 tablet 1  . clorazepate (TRANXENE) 7.5 MG tablet Take 1 tablet (7.5 mg total) by mouth 3 (three) times daily as needed for anxiety. 90 tablet 5  .  levothyroxine (SYNTHROID) 75 MCG tablet Take 1 tablet (75 mcg total) by mouth daily before breakfast. 90 tablet 3  . metoprolol tartrate (LOPRESSOR) 25 MG tablet Take 0.5 tablets (12.5 mg total) by mouth 2 (two) times daily. 90 tablet 1  . pramipexole (MIRAPEX) 0.25 MG tablet TAKE 1 TABLET AT BEDTIME FOR RESTLESS LEG SYMPTOMS 90 tablet 6  . pravastatin (PRAVACHOL) 40 MG tablet TAKE 1 TABLET DAILY 90 tablet 0  . senna-docusate (SENOKOT-S) 8.6-50 MG per tablet Take 1 tablet by mouth at bedtime.     . traMADol (ULTRAM) 50 MG tablet Take 1 tablet (50 mg total) by mouth 3 (three) times daily as needed for moderate pain. 90 tablet 5   No current facility-administered medications on file prior to visit.     Past Medical History  Diagnosis Date  . COPD (chronic obstructive pulmonary disease)   . Cigarette smoker   . Mitral valve prolapse   . Palpitations   . Cerebrovascular disease   . Aneurysm   . Venous insufficiency   . Hyperlipidemia   . Hypothyroidism   . IBS (irritable bowel syndrome)   . Nephrolithiasis   . Tendinitis of right elbow   . Fibromyalgia   . Osteopenia   . Vitamin D deficiency   . AN (acoustic neuroma)   . Anxiety   . Insomnia   . Coronary artery disease      Past Surgical History  Procedure Laterality Date  . Translabyrinthine procedure  2002    [facility] Dr. Vicie Mutters  . Total abdominal hysterectomy    .  Breast lumpectomy    . Aneurysm coiling  11-10    Dr. Estanislado Pandy     Family History  Problem Relation Age of Onset  . Heart attack Mother   . Cancer Father   . Diabetes Mother   . Diabetes Sister   . Diabetes Brother   . Cancer Sister     LUNG  . Cancer Brother     Lung     History   Social History  . Marital Status: Married    Spouse Name: N/A  . Number of Children: N/A  . Years of Education: N/A   Occupational History  . alterations    Social History Main Topics  . Smoking status: Current Every Day Smoker    Types: Cigarettes  . Smokeless  tobacco: Never Used     Comment: 4-5 cigs daily  is aware she needs to quit  . Alcohol Use: No  . Drug Use: No  . Sexual Activity: No   Other Topics Concern  . Not on file   Social History Narrative     ROS A 10 point review of system was performed. It is negative other than that mentioned in the history of present illness.   PHYSICAL EXAM   BP 150/82 mmHg  Pulse 68  Ht 5\' 2"  (1.575 m)  Wt 125 lb 12.8 oz (57.063 kg)  BMI 23.00 kg/m2  Constitutional: She is oriented to person, place, and time. She appears well-developed and well-nourished. No distress.  HENT: No nasal discharge.  Head: Normocephalic and atraumatic.  Eyes: Pupils are equal and round. No discharge.  Neck: Normal range of motion. Neck supple. No JVD present. No thyromegaly present.  Cardiovascular: Normal rate, regular rhythm, normal heart sounds. Exam reveals no gallop and no friction rub. No murmur heard.  Pulmonary/Chest: Effort normal and breath sounds normal. No stridor. No respiratory distress. She has no wheezes. She has no rales. She exhibits no tenderness.  Abdominal: Soft. Bowel sounds are normal. She exhibits no distension. There is no tenderness. There is no rebound and no guarding.  Musculoskeletal: Normal range of motion. She exhibits no edema and no tenderness.  Neurological: She is alert and oriented to person, place, and time. Coordination normal.  Skin: Skin is warm and dry. No rash noted. She is not diaphoretic. No erythema. No pallor.  Psychiatric: She has a normal mood and affect. Her behavior is normal. Judgment and thought content normal.    EKG:EKG from yesterday showed normal sinus rhythm with PVCs and nonspecific ST depression in the inferior leads.   ASSESSMENT AND PLAN

## 2014-08-04 NOTE — Patient Instructions (Signed)
Your physician recommends that you continue on your current medications as directed. Please refer to the Current Medication list given to you today. Your physician has requested that you have a cardiac catheterization. Cardiac catheterization is used to diagnose and/or treat various heart conditions. Doctors may recommend this procedure for a number of different reasons. The most common reason is to evaluate chest pain. Chest pain can be a symptom of coronary artery disease (CAD), and cardiac catheterization can show whether plaque is narrowing or blocking your heart's arteries. This procedure is also used to evaluate the valves, as well as measure the blood flow and oxygen levels in different parts of your heart. For further information please visit [email or web address]. Please follow instruction sheet, as given.  Your physician recommends that you return for lab work today. (cbc, bmet, pt/inr) A chest x-ray takes a picture of the organs and structures inside the chest, including the heart, lungs, and blood vessels. This test can show several things, including, whether the heart is enlarges; whether fluid is building up in the lungs; and whether pacemaker / defibrillator leads are still in place.

## 2014-08-04 NOTE — Assessment & Plan Note (Signed)
The patient's symptoms are worrisome for unstable angina with progressive symptoms. She has frequent PVCs noted and nonspecific inferior ST depression.. She has multiple risk factors for coronary artery disease. Thus, I recommend proceeding with urgent cardiac catheterization and possible coronary intervention. Risks, benefits and alternatives were discussed with the patient. This is scheduled for tomorrow. She is to continue dual antiplatelet therapy. She was started recently on small dose metoprolol.

## 2014-08-05 ENCOUNTER — Telehealth: Payer: Self-pay | Admitting: Pulmonary Disease

## 2014-08-05 ENCOUNTER — Encounter (HOSPITAL_COMMUNITY): Payer: Self-pay | Admitting: *Deleted

## 2014-08-05 ENCOUNTER — Encounter (HOSPITAL_COMMUNITY)
Admission: RE | Disposition: A | Discharge status: Home / Self Care | Payer: Self-pay | Source: Ambulatory Visit | Attending: Cardiovascular Disease

## 2014-08-05 ENCOUNTER — Ambulatory Visit (HOSPITAL_COMMUNITY)
Admission: RE | Admit: 2014-08-05 | Discharge: 2014-08-05 | Disposition: A | Discharge status: Home / Self Care | Payer: Medicare Other | Source: Ambulatory Visit | Attending: Cardiovascular Disease | Admitting: Cardiovascular Disease

## 2014-08-05 DIAGNOSIS — F17210 Nicotine dependence, cigarettes, uncomplicated: Secondary | ICD-10-CM | POA: Insufficient documentation

## 2014-08-05 DIAGNOSIS — K589 Irritable bowel syndrome without diarrhea: Secondary | ICD-10-CM | POA: Diagnosis not present

## 2014-08-05 DIAGNOSIS — I25119 Atherosclerotic heart disease of native coronary artery with unspecified angina pectoris: Secondary | ICD-10-CM | POA: Insufficient documentation

## 2014-08-05 DIAGNOSIS — I10 Essential (primary) hypertension: Secondary | ICD-10-CM | POA: Diagnosis not present

## 2014-08-05 DIAGNOSIS — R079 Chest pain, unspecified: Secondary | ICD-10-CM | POA: Diagnosis not present

## 2014-08-05 DIAGNOSIS — G4700 Insomnia, unspecified: Secondary | ICD-10-CM | POA: Insufficient documentation

## 2014-08-05 DIAGNOSIS — Z7982 Long term (current) use of aspirin: Secondary | ICD-10-CM | POA: Insufficient documentation

## 2014-08-05 DIAGNOSIS — E039 Hypothyroidism, unspecified: Secondary | ICD-10-CM | POA: Insufficient documentation

## 2014-08-05 DIAGNOSIS — E785 Hyperlipidemia, unspecified: Secondary | ICD-10-CM | POA: Insufficient documentation

## 2014-08-05 DIAGNOSIS — M797 Fibromyalgia: Secondary | ICD-10-CM | POA: Diagnosis not present

## 2014-08-05 DIAGNOSIS — Z791 Long term (current) use of non-steroidal anti-inflammatories (NSAID): Secondary | ICD-10-CM | POA: Insufficient documentation

## 2014-08-05 DIAGNOSIS — Z87442 Personal history of urinary calculi: Secondary | ICD-10-CM | POA: Insufficient documentation

## 2014-08-05 DIAGNOSIS — J449 Chronic obstructive pulmonary disease, unspecified: Secondary | ICD-10-CM | POA: Insufficient documentation

## 2014-08-05 DIAGNOSIS — Z79899 Other long term (current) drug therapy: Secondary | ICD-10-CM | POA: Insufficient documentation

## 2014-08-05 DIAGNOSIS — Z8673 Personal history of transient ischemic attack (TIA), and cerebral infarction without residual deficits: Secondary | ICD-10-CM | POA: Insufficient documentation

## 2014-08-05 DIAGNOSIS — I200 Unstable angina: Secondary | ICD-10-CM | POA: Diagnosis present

## 2014-08-05 HISTORY — PX: LEFT HEART CATHETERIZATION WITH CORONARY ANGIOGRAM: SHX5451

## 2014-08-05 SURGERY — LEFT HEART CATHETERIZATION WITH CORONARY ANGIOGRAM

## 2014-08-05 MED ORDER — FENTANYL CITRATE 0.05 MG/ML IJ SOLN
INTRAMUSCULAR | Status: AC
  Filled 2014-08-05: qty 2

## 2014-08-05 MED ORDER — HEPARIN SODIUM (PORCINE) 1000 UNIT/ML IJ SOLN
INTRAMUSCULAR | Status: AC
  Filled 2014-08-05: qty 1

## 2014-08-05 MED ORDER — SODIUM CHLORIDE 0.9 % IV SOLN: INTRAVENOUS | Status: DC

## 2014-08-05 MED ORDER — SODIUM CHLORIDE 0.9 % IJ SOLN: 3.0000 mL | INTRAMUSCULAR | Status: DC | PRN

## 2014-08-05 MED ORDER — SODIUM CHLORIDE 0.9 % IV SOLN
INTRAVENOUS | Status: DC
  Administered 2014-08-05: 09:00:00 via INTRAVENOUS

## 2014-08-05 MED ORDER — LIDOCAINE HCL (PF) 1 % IJ SOLN
INTRAMUSCULAR | Status: AC
  Filled 2014-08-05: qty 30

## 2014-08-05 MED ORDER — SODIUM CHLORIDE 0.9 % IV SOLN: 250.0000 mL | INTRAVENOUS | Status: DC | PRN

## 2014-08-05 MED ORDER — MIDAZOLAM HCL 2 MG/2ML IJ SOLN
INTRAMUSCULAR | Status: AC
  Filled 2014-08-05: qty 2

## 2014-08-05 MED ORDER — SODIUM CHLORIDE 0.9 % IJ SOLN: 3.0000 mL | Freq: Two times a day (BID) | INTRAMUSCULAR | Status: DC

## 2014-08-05 MED ORDER — ASPIRIN 81 MG PO CHEW: 81.0000 mg | CHEWABLE_TABLET | ORAL | Status: DC

## 2014-08-05 MED ORDER — METOPROLOL TARTRATE 25 MG PO TABS: 12.5000 mg | ORAL_TABLET | Freq: Two times a day (BID) | ORAL | Status: DC

## 2014-08-05 MED ORDER — HEPARIN (PORCINE) IN NACL 2-0.9 UNIT/ML-% IJ SOLN
INTRAMUSCULAR | Status: AC
  Filled 2014-08-05: qty 1000

## 2014-08-05 MED ORDER — VERAPAMIL HCL 2.5 MG/ML IV SOLN
INTRAVENOUS | Status: AC
  Filled 2014-08-05: qty 2

## 2014-08-05 NOTE — CV Procedure (Signed)
   Cardiac Catheterization Procedure Note  Name: Ana Santos MRN: [id number] DOB: Feb 14, 1941  Procedure: Left Heart Cath, Selective Coronary Angiography, LV angiography  Indication: Chest pain suggestive of unstable angina with PVCs  Medications:  Sedation:  1 mg IV Versed, 25 mcg IV Fentanyl  Contrast:  70 mL Omnipaque   Procedural Details: The right wrist was prepped, draped, and anesthetized with 1% lidocaine. Using the modified Seldinger technique, a 5 French sheath was introduced into the right radial artery. 3 mg of verapamil was administered through the sheath, weight-based unfractionated heparin was administered intravenously. A Jackie catheter was used for selective coronary angiography. A pigtail catheter was used for left ventriculography. Catheter exchanges were performed over an exchange length guidewire. There were no immediate procedural complications. A TR band was used for radial hemostasis at the completion of the procedure.  The patient was transferred to the post catheterization recovery area for further monitoring.  Procedural Findings:  Hemodynamics: AO:  161/64   mmHg LV:  160/6    mmHg LVEDP: 15  mmHg  Coronary angiography: Coronary dominance: Right   Left Main:  Normal.   Left Anterior Descending (LAD):   normal in size with minor irregularities.  1st diagonal (D1):  Large in size with no significant disease.  2nd diagonal (D2):  Normal  3rd diagonal (D3):  Small in size with no significant disease.  Circumflex (LCx):  Normal in size with minor irregularities.  1st obtuse marginal:  Normal  2nd obtuse marginal:  Small in size and normal  3rd obtuse marginal:  Normal   AV groove continuation segment: Normal  Right Coronary Artery: Normal in size with no significant disease.  Posterior descending artery: Normal  Posterior AV segment: Small in size and normal   Left ventriculography: Left ventricular systolic function is normal , LVEF  is estimated at 55 %, there is no significant mitral regurgitation   Final Conclusions:   1. Minor luminal irregularities with no evidence of obstructive disease. 2. Normal LV systolic function with mildly elevated left ventricular end-diastolic pressure.  Recommendations:  The patient's symptoms are likely due to frequent PVCs. Increased the dose of metoprolol and obtain an echocardiogram. Holter monitor can be considered to quantify the PVCs if symptoms persist.  Kathlyn Sacramento MD, [facility] 08/05/2014, 10:26 AM

## 2014-08-05 NOTE — Interval H&P Note (Signed)
History and Physical Interval Note:  08/05/2014 9:55 AM  Ana Santos  has presented today for surgery, with the diagnosis of unstable angina  The various methods of treatment have been discussed with the patient and family. After consideration of risks, benefits and other options for treatment, the patient has consented to  Procedure(s): LEFT HEART CATHETERIZATION WITH CORONARY ANGIOGRAM (N/A) as a surgical intervention .  The patient's history has been reviewed, patient examined, no change in status, stable for surgery.  I have reviewed the patient's chart and labs.  Questions were answered to the patient's satisfaction.     Kathlyn Sacramento

## 2014-08-05 NOTE — Telephone Encounter (Signed)
Called and spoke with pt and she stated that cardiologist wanted her to go ahead and start on med today.  Metoprolol was sent to mail order pharmacy for pt at her appt with SN the other day.  Pt is aware that small supply has been sent to the local pharmacy until she can get this in the mail.  Nothing further is needed.

## 2014-08-05 NOTE — Discharge Instructions (Signed)
Increase metoprolol to 25 mg twice daily.   Radial Site Care Refer to this sheet in the next few weeks. These instructions provide you with information on caring for yourself after your procedure. Your caregiver may also give you more specific instructions. Your treatment has been planned according to current medical practices, but problems sometimes occur. Call your caregiver if you have any problems or questions after your procedure. HOME CARE INSTRUCTIONS  You may shower the day after the procedure.Remove the bandage (dressing) and gently wash the site with plain soap and water.Gently pat the site dry.  Do not apply powder or lotion to the site.  Do not submerge the affected site in water for 3 to 5 days.  Inspect the site at least twice daily.  Do not flex or bend the affected arm for 24 hours.  No lifting over 5 pounds (2.3 kg) for 5 days after your procedure.  Do not drive home if you are discharged the same day of the procedure. Have someone else drive you.  You may drive 24 hours after the procedure unless otherwise instructed by your caregiver.  Do not operate machinery or power tools for 24 hours.  A responsible adult should be with you for the first 24 hours after you arrive home. What to expect:  Any bruising will usually fade within 1 to 2 weeks.  Blood that collects in the tissue (hematoma) may be painful to the touch. It should usually decrease in size and tenderness within 1 to 2 weeks. SEEK IMMEDIATE MEDICAL CARE IF:  You have unusual pain at the radial site.  You have redness, warmth, swelling, or pain at the radial site.  You have drainage (other than a small amount of blood on the dressing).  You have chills.  You have a fever or persistent symptoms for more than 72 hours.  You have a fever and your symptoms suddenly get worse.  Your arm becomes pale, cool, tingly, or numb.  You have heavy bleeding from the site. Hold pressure on the site and call  911. Document Released: 07/01/2010 Document Revised: 08/21/2011 Document Reviewed: 07/01/2010 [facility] Patient Information 2015 [place], [place]. This information is not intended to replace advice given to you by your health care provider. Make sure you discuss any questions you have with your health care provider.

## 2014-08-05 NOTE — H&P (View-Only) (Signed)
Primary care physician: Dr. Lenna Gilford  HPI  This is a pleasant 74 year old female who was referred for evaluation of chest pain and shortness of breath. She has seen Dr. Lia Foyer in the 90s for chest pain. She had cardiac catheterization done which showed no evidence of obstructive coronary artery disease. She was told about mitral valve prolapse. Most recent echocardiogram in February 2015 showed normal LV systolic function with no significant valvular abnormalities. She has known history of hypertension, previous stroke status post aneurysm coiling currently on dual antiplatelet therapy, hyperlipidemia, tobacco use, hypertension and family history of coronary artery disease. She is not diabetic. Over the last month, she has experienced recurrent palpitations described as skipping every fourth or fifth beat. Around the same time, she started having substernal chest tightness with no radiation. This happens with activities and occasionally at rest and has been getting worse. She has noticed worsening exertional dyspnea and overall fatigue.  Allergies  Allergen Reactions  . Atorvastatin Other (See Comments)    REACTION: INTOL to Lipitor w/ arm pain (3/09)  . Hydrocodone-Acetaminophen Other (See Comments)    REACTION: hallucinations  . Morphine Other (See Comments)    REACTION: hallucinations  . Topamax [Topiramate] Nausea And Vomiting    *dizziness*     Current Outpatient Prescriptions on File Prior to Visit  Medication Sig Dispense Refill  . acetaminophen (TYLENOL) 500 MG tablet Take 500 mg by mouth every 6 (six) hours as needed for mild pain.     Marland Kitchen aspirin 325 MG tablet Take 325 mg by mouth daily.      . cholecalciferol (VITAMIN D) 1000 UNITS tablet Take 1,000 Units by mouth daily.      . clopidogrel (PLAVIX) 75 MG tablet TAKE 1 TABLET DAILY 90 tablet 1  . clorazepate (TRANXENE) 7.5 MG tablet Take 1 tablet (7.5 mg total) by mouth 3 (three) times daily as needed for anxiety. 90 tablet 5  .  levothyroxine (SYNTHROID) 75 MCG tablet Take 1 tablet (75 mcg total) by mouth daily before breakfast. 90 tablet 3  . metoprolol tartrate (LOPRESSOR) 25 MG tablet Take 0.5 tablets (12.5 mg total) by mouth 2 (two) times daily. 90 tablet 1  . pramipexole (MIRAPEX) 0.25 MG tablet TAKE 1 TABLET AT BEDTIME FOR RESTLESS LEG SYMPTOMS 90 tablet 6  . pravastatin (PRAVACHOL) 40 MG tablet TAKE 1 TABLET DAILY 90 tablet 0  . senna-docusate (SENOKOT-S) 8.6-50 MG per tablet Take 1 tablet by mouth at bedtime.     . traMADol (ULTRAM) 50 MG tablet Take 1 tablet (50 mg total) by mouth 3 (three) times daily as needed for moderate pain. 90 tablet 5   No current facility-administered medications on file prior to visit.     Past Medical History  Diagnosis Date  . COPD (chronic obstructive pulmonary disease)   . Cigarette smoker   . Mitral valve prolapse   . Palpitations   . Cerebrovascular disease   . Aneurysm   . Venous insufficiency   . Hyperlipidemia   . Hypothyroidism   . IBS (irritable bowel syndrome)   . Nephrolithiasis   . Tendinitis of right elbow   . Fibromyalgia   . Osteopenia   . Vitamin D deficiency   . AN (acoustic neuroma)   . Anxiety   . Insomnia   . Coronary artery disease      Past Surgical History  Procedure Laterality Date  . Translabyrinthine procedure  2002    [facility] Dr. Vicie Mutters  . Total abdominal hysterectomy    .  Breast lumpectomy    . Aneurysm coiling  11-10    Dr. Estanislado Pandy     Family History  Problem Relation Age of Onset  . Heart attack Mother   . Cancer Father   . Diabetes Mother   . Diabetes Sister   . Diabetes Brother   . Cancer Sister     LUNG  . Cancer Brother     Lung     History   Social History  . Marital Status: Married    Spouse Name: N/A  . Number of Children: N/A  . Years of Education: N/A   Occupational History  . alterations    Social History Main Topics  . Smoking status: Current Every Day Smoker    Types: Cigarettes  . Smokeless  tobacco: Never Used     Comment: 4-5 cigs daily  is aware she needs to quit  . Alcohol Use: No  . Drug Use: No  . Sexual Activity: No   Other Topics Concern  . Not on file   Social History Narrative     ROS A 10 point review of system was performed. It is negative other than that mentioned in the history of present illness.   PHYSICAL EXAM   BP 150/82 mmHg  Pulse 68  Ht 5\' 2"  (1.575 m)  Wt 125 lb 12.8 oz (57.063 kg)  BMI 23.00 kg/m2  Constitutional: She is oriented to person, place, and time. She appears well-developed and well-nourished. No distress.  HENT: No nasal discharge.  Head: Normocephalic and atraumatic.  Eyes: Pupils are equal and round. No discharge.  Neck: Normal range of motion. Neck supple. No JVD present. No thyromegaly present.  Cardiovascular: Normal rate, regular rhythm, normal heart sounds. Exam reveals no gallop and no friction rub. No murmur heard.  Pulmonary/Chest: Effort normal and breath sounds normal. No stridor. No respiratory distress. She has no wheezes. She has no rales. She exhibits no tenderness.  Abdominal: Soft. Bowel sounds are normal. She exhibits no distension. There is no tenderness. There is no rebound and no guarding.  Musculoskeletal: Normal range of motion. She exhibits no edema and no tenderness.  Neurological: She is alert and oriented to person, place, and time. Coordination normal.  Skin: Skin is warm and dry. No rash noted. She is not diaphoretic. No erythema. No pallor.  Psychiatric: She has a normal mood and affect. Her behavior is normal. Judgment and thought content normal.    EKG:EKG from yesterday showed normal sinus rhythm with PVCs and nonspecific ST depression in the inferior leads.   ASSESSMENT AND PLAN

## 2014-08-07 ENCOUNTER — Telehealth: Payer: Self-pay

## 2014-08-07 DIAGNOSIS — I493 Ventricular premature depolarization: Secondary | ICD-10-CM

## 2014-08-07 NOTE — Telephone Encounter (Signed)
I spoke with the Ana Santos and scheduled her appointments on 08/18/14.

## 2014-08-07 NOTE — Telephone Encounter (Signed)
Follow up     Returned Lauren's call

## 2014-08-07 NOTE — Telephone Encounter (Signed)
Wellington Hampshire, MD  Walker, RN            Cath was fine. Schedule an echo (PVCs) and follow up with me after that.        Left message for pt to contact the office. Both Echo and office visit can be scheduled on 08/18/14.

## 2014-08-13 ENCOUNTER — Telehealth: Payer: Self-pay | Admitting: Pulmonary Disease

## 2014-08-13 MED ORDER — METOPROLOL TARTRATE 25 MG PO TABS: 25.0000 mg | ORAL_TABLET | Freq: Two times a day (BID) | ORAL | Status: DC

## 2014-08-13 NOTE — Telephone Encounter (Signed)
Per SN: ok for Metoprolol 25mg  1 po BID #180, refill x3.  Thanks.

## 2014-08-13 NOTE — Telephone Encounter (Signed)
Spoke with pt. She reports cardiology advised her she needed to increase her metoprolol to 25 1 tablet twice a day. Pt was taking 1/2 tab BID. She needs new RX sent in for the change. Please advise SN if okay to do so. thanks

## 2014-08-13 NOTE — Telephone Encounter (Signed)
Spoke with and notified of recs per SN  [facility]

## 2014-08-18 ENCOUNTER — Encounter: Payer: Self-pay | Admitting: Cardiovascular Disease

## 2014-08-18 ENCOUNTER — Ambulatory Visit (INDEPENDENT_AMBULATORY_CARE_PROVIDER_SITE_OTHER): Payer: Medicare Other | Admitting: Cardiovascular Disease

## 2014-08-18 ENCOUNTER — Ambulatory Visit (HOSPITAL_COMMUNITY): Payer: Medicare Other | Attending: Cardiology

## 2014-08-18 VITALS — BP 169/70 | HR 68 | Ht 62.0 in | Wt 129.4 lb

## 2014-08-18 DIAGNOSIS — I2510 Atherosclerotic heart disease of native coronary artery without angina pectoris: Secondary | ICD-10-CM

## 2014-08-18 DIAGNOSIS — E785 Hyperlipidemia, unspecified: Secondary | ICD-10-CM

## 2014-08-18 DIAGNOSIS — I493 Ventricular premature depolarization: Secondary | ICD-10-CM | POA: Diagnosis not present

## 2014-08-18 DIAGNOSIS — I200 Unstable angina: Secondary | ICD-10-CM

## 2014-08-18 NOTE — Progress Notes (Signed)
2D Echo completed. 08/18/2014

## 2014-08-18 NOTE — Progress Notes (Signed)
Primary care physician: Dr. Lenna Gilford  HPI  This is a pleasant 74 year old female who is here today for a follow-up visit regarding  chest pain and shortness of breath. She has seen Dr. Lia Foyer in the 90s for chest pain. She had cardiac catheterization done which showed no evidence of obstructive coronary artery disease. She was told about mitral valve prolapse.  She has known history of hypertension, previous stroke status post aneurysm coiling currently on dual antiplatelet therapy, hyperlipidemia, tobacco use, hypertension and family history of coronary artery disease. She is not diabetic. She was seen recently for recurrent palpitations described as skipping every fourth or fifth beat. Around the same time, she started having substernal exertional chest tightness with no radiation.  I proceeded with cardiac catheterization which showed minor luminal irregularities with no evidence of obstructive coronary artery disease. Ejection fraction was normal. Symptoms were felt to be due to PVCs. This improved significantly with metoprolol. I repeated echocardiogram which overall was unremarkable. She is feeling significantly better.   Allergies  Allergen Reactions  . Atorvastatin Other (See Comments)    REACTION: INTOL to Lipitor w/ arm pain (3/09)  . Hydrocodone-Acetaminophen Other (See Comments)    REACTION: hallucinations  . Morphine Other (See Comments)    REACTION: hallucinations  . Topamax [Topiramate] Nausea And Vomiting    *dizziness*     Current Outpatient Prescriptions on File Prior to Visit  Medication Sig Dispense Refill  . acetaminophen (TYLENOL) 500 MG tablet Take 500 mg by mouth every 6 (six) hours as needed for mild pain.     Marland Kitchen aspirin 325 MG tablet Take 325 mg by mouth daily.      . calcium gluconate 500 MG tablet Take 1 tablet by mouth daily.    . cholecalciferol (VITAMIN D) 1000 UNITS tablet Take 1,000 Units by mouth daily.      . clopidogrel (PLAVIX) 75 MG tablet TAKE 1  TABLET DAILY 90 tablet 1  . clorazepate (TRANXENE) 7.5 MG tablet Take 1 tablet (7.5 mg total) by mouth 3 (three) times daily as needed for anxiety. 90 tablet 5  . hydroxypropyl methylcellulose / hypromellose (ISOPTO TEARS / GONIOVISC) 2.5 % ophthalmic solution Place 1 drop into both eyes as needed for dry eyes.    Marland Kitchen levothyroxine (SYNTHROID) 75 MCG tablet Take 1 tablet (75 mcg total) by mouth daily before breakfast. 90 tablet 3  . metoprolol tartrate (LOPRESSOR) 25 MG tablet Take 1 tablet (25 mg total) by mouth 2 (two) times daily. 180 tablet 3  . pramipexole (MIRAPEX) 0.25 MG tablet TAKE 1 TABLET AT BEDTIME FOR RESTLESS LEG SYMPTOMS 90 tablet 6  . pravastatin (PRAVACHOL) 40 MG tablet TAKE 1 TABLET DAILY 90 tablet 0  . senna-docusate (SENOKOT-S) 8.6-50 MG per tablet Take 1 tablet by mouth at bedtime.     . traMADol (ULTRAM) 50 MG tablet Take 1 tablet (50 mg total) by mouth 3 (three) times daily as needed for moderate pain. 90 tablet 5   No current facility-administered medications on file prior to visit.     Past Medical History  Diagnosis Date  . COPD (chronic obstructive pulmonary disease)   . Cigarette smoker   . Mitral valve prolapse   . Palpitations   . Cerebrovascular disease   . Aneurysm   . Venous insufficiency   . Hyperlipidemia   . Hypothyroidism   . IBS (irritable bowel syndrome)   . Nephrolithiasis   . Tendinitis of right elbow   . Fibromyalgia   . Osteopenia   .  Vitamin D deficiency   . AN (acoustic neuroma)   . Anxiety   . Insomnia   . Coronary artery disease      Past Surgical History  Procedure Laterality Date  . Translabyrinthine procedure  2002    [facility] Dr. Vicie Mutters  . Total abdominal hysterectomy    . Breast lumpectomy    . Aneurysm coiling  11-10    Dr. Estanislado Pandy  . Left heart catheterization with coronary angiogram N/A 08/05/2014    Procedure: LEFT HEART CATHETERIZATION WITH CORONARY ANGIOGRAM;  Surgeon: Wellington Hampshire, MD;  Location: [facility];   Service: Cardiovascular;  Laterality: N/A;     Family History  Problem Relation Age of Onset  . Heart attack Mother   . Diabetes Mother   . Cancer Father   . Diabetes Sister   . Diabetes Brother   . Cancer Sister     LUNG  . Cancer Brother     Lung     History   Social History  . Marital Status: Married    Spouse Name: N/A  . Number of Children: N/A  . Years of Education: N/A   Occupational History  . alterations    Social History Main Topics  . Smoking status: Current Every Day Smoker    Types: Cigarettes  . Smokeless tobacco: Never Used     Comment: 4-5 cigs daily  is aware she needs to quit  . Alcohol Use: No  . Drug Use: No  . Sexual Activity: No   Other Topics Concern  . Not on file   Social History Narrative     ROS A 10 point review of system was performed. It is negative other than that mentioned in the history of present illness.   PHYSICAL EXAM   BP 169/70 mmHg  Pulse 68  Ht 5\' 2"  (1.575 m)  Wt 129 lb 6.4 oz (58.695 kg)  BMI 23.66 kg/m2  Constitutional: She is oriented to person, place, and time. She appears well-developed and well-nourished. No distress.  HENT: No nasal discharge.  Head: Normocephalic and atraumatic.  Eyes: Pupils are equal and round. No discharge.  Neck: Normal range of motion. Neck supple. No JVD present. No thyromegaly present.  Cardiovascular: Normal rate, regular rhythm, normal heart sounds. Exam reveals no gallop and no friction rub. No murmur heard.  Pulmonary/Chest: Effort normal and breath sounds normal. No stridor. No respiratory distress. She has no wheezes. She has no rales. She exhibits no tenderness.  Abdominal: Soft. Bowel sounds are normal. She exhibits no distension. There is no tenderness. There is no rebound and no guarding.  Musculoskeletal: Normal range of motion. She exhibits no edema and no tenderness.  Neurological: She is alert and oriented to person, place, and time. Coordination normal.  Skin:  Skin is warm and dry. No rash noted. She is not diaphoretic. No erythema. No pallor.  Psychiatric: She has a normal mood and affect. Her behavior is normal. Judgment and thought content normal.      ASSESSMENT AND PLAN

## 2014-08-18 NOTE — Patient Instructions (Signed)
Your physician wants you to follow-up in: 6 months with Dr. Arida You will receive a reminder letter in the mail two months in advance. If you don't receive a letter, please call our office to schedule the follow-up appointment.  

## 2014-08-19 ENCOUNTER — Other Ambulatory Visit: Payer: Self-pay | Admitting: Pulmonary Disease

## 2014-08-24 NOTE — Assessment & Plan Note (Signed)
This is the likely culprit for her symptoms and improved significantly with metoprolol. We discussed lifestyle modifications including cutting down caffeine intake.

## 2014-08-24 NOTE — Assessment & Plan Note (Signed)
Currently on pravastatin. Recommend a target LDL of less than 100.

## 2014-09-15 DIAGNOSIS — H02839 Dermatochalasis of unspecified eye, unspecified eyelid: Secondary | ICD-10-CM | POA: Diagnosis not present

## 2014-09-15 DIAGNOSIS — H18411 Arcus senilis, right eye: Secondary | ICD-10-CM | POA: Diagnosis not present

## 2014-09-15 DIAGNOSIS — H2511 Age-related nuclear cataract, right eye: Secondary | ICD-10-CM | POA: Diagnosis not present

## 2014-09-15 DIAGNOSIS — H18412 Arcus senilis, left eye: Secondary | ICD-10-CM | POA: Diagnosis not present

## 2014-09-29 ENCOUNTER — Telehealth: Payer: Self-pay | Admitting: Pulmonary Disease

## 2014-09-29 MED ORDER — TRAMADOL HCL 50 MG PO TABS: 50.0000 mg | ORAL_TABLET | Freq: Three times a day (TID) | ORAL | Status: DC | PRN

## 2014-09-29 MED ORDER — CLORAZEPATE DIPOTASSIUM 7.5 MG PO TABS: 7.5000 mg | ORAL_TABLET | Freq: Three times a day (TID) | ORAL | Status: DC | PRN

## 2014-09-29 NOTE — Telephone Encounter (Signed)
Per SN- Ok to refill Tramadol and Clorazepate, both with 5 refills.   Called and spoke to pt. Informed pt of the refills. Pt requested [facility]. Fax script to [facility]. Pt verbalized understanding and denied any further questions or concerns at this time.

## 2014-09-29 NOTE — Telephone Encounter (Signed)
Spoke with the pt  She is asking for refills on tramadol and clorazepate- wants # 270 tablets for 90 day supply sent to [facility]  Last ov 08/03/14 Next ov 10/16/14  Last given clorazepate # 64 with 5 rf on 10/15/13 Last given tramadol # 90 with 5 rf 03/13/14   Please advise, thanks!

## 2014-10-12 DIAGNOSIS — H2511 Age-related nuclear cataract, right eye: Secondary | ICD-10-CM | POA: Diagnosis not present

## 2014-10-12 DIAGNOSIS — H25811 Combined forms of age-related cataract, right eye: Secondary | ICD-10-CM | POA: Diagnosis not present

## 2014-10-13 DIAGNOSIS — H2512 Age-related nuclear cataract, left eye: Secondary | ICD-10-CM | POA: Diagnosis not present

## 2014-10-16 ENCOUNTER — Ambulatory Visit: Payer: Medicare Other | Admitting: Pulmonary Disease

## 2014-10-20 ENCOUNTER — Ambulatory Visit (INDEPENDENT_AMBULATORY_CARE_PROVIDER_SITE_OTHER): Payer: Medicare Other | Admitting: Pulmonary Disease

## 2014-10-20 ENCOUNTER — Encounter: Payer: Self-pay | Admitting: Pulmonary Disease

## 2014-10-20 VITALS — BP 122/62 | HR 66 | Temp 98.3°F | Wt 126.4 lb

## 2014-10-20 DIAGNOSIS — G8929 Other chronic pain: Secondary | ICD-10-CM

## 2014-10-20 DIAGNOSIS — R519 Headache, unspecified: Secondary | ICD-10-CM

## 2014-10-20 DIAGNOSIS — I493 Ventricular premature depolarization: Secondary | ICD-10-CM | POA: Diagnosis not present

## 2014-10-20 DIAGNOSIS — E039 Hypothyroidism, unspecified: Secondary | ICD-10-CM

## 2014-10-20 DIAGNOSIS — I639 Cerebral infarction, unspecified: Secondary | ICD-10-CM

## 2014-10-20 DIAGNOSIS — F17200 Nicotine dependence, unspecified, uncomplicated: Secondary | ICD-10-CM

## 2014-10-20 DIAGNOSIS — I059 Rheumatic mitral valve disease, unspecified: Secondary | ICD-10-CM | POA: Diagnosis not present

## 2014-10-20 DIAGNOSIS — Z720 Tobacco use: Secondary | ICD-10-CM

## 2014-10-20 DIAGNOSIS — F411 Generalized anxiety disorder: Secondary | ICD-10-CM

## 2014-10-20 DIAGNOSIS — J449 Chronic obstructive pulmonary disease, unspecified: Secondary | ICD-10-CM

## 2014-10-20 DIAGNOSIS — I679 Cerebrovascular disease, unspecified: Secondary | ICD-10-CM

## 2014-10-20 DIAGNOSIS — D333 Benign neoplasm of cranial nerves: Secondary | ICD-10-CM

## 2014-10-20 DIAGNOSIS — I200 Unstable angina: Secondary | ICD-10-CM

## 2014-10-20 DIAGNOSIS — I6350 Cerebral infarction due to unspecified occlusion or stenosis of unspecified cerebral artery: Secondary | ICD-10-CM

## 2014-10-20 DIAGNOSIS — J4489 Other specified chronic obstructive pulmonary disease: Secondary | ICD-10-CM

## 2014-10-20 DIAGNOSIS — R51 Headache: Secondary | ICD-10-CM

## 2014-10-20 NOTE — Progress Notes (Signed)
Subjective:    Patient ID: Ana Santos, female    DOB: [birth year], 74 y.o.   MRN: [id number]  HPI 74 y/o WF here for a follow up visit... she has multiple medical problems as reviewed below...   ~  SEE PREV EPIC NOTES FOR EARLIER DATA >>   LABS 6/13:  FLP- at goals on Prav40;  Chems- wnl;  CBC- wnl;  TSH=0.71;  VitD=53...  ~  January 01, 2013:  72mo ROV & Ana Santos is due for her follow up appts in [place] [facility] but hasn't heard from DrMorris or DrBrowne & we will facilitate her f/u there... She has been under incr stress caring for her husb who had open heart surg... We reviewed the following medical problems during today's office visit >>     COPD> still smoking 1/4-1/2ppd & can't vs won't quit; not on meds, denies cough/ sputum/ hemoptysis/ SOB/ ch in DOE, etc; CXR 4/13= COPD, NAD...    Cerebrovasc dis/ Aneurysm> on ASA325, Plavix75; last f/u DrMorris IR at [facility] 8/12 w/ CTA that looked good- no resid aneurysm, distal flow appeared good & he didn't rec any changes & will f/u again in 52yrs; due now & we will refer to them...    Hyperlipid> on Prav40; tol well & FLP 7/14 shows TChol 174, TG 68, HDL 58, LDL 103; continue same...    Hypothyroid> on Levothy75; labs 7/14 shows TSH= 2.47    DJD/ FM> she is managing quite well w/ prn Tramadol & Tylenol; prev CWP resolved...    Osteopenia> she had min osteopenia on BMD several yrs ago & takes Calcium, MVI, Vit D 1000u daily...    RLS> on Mirapex 0.25mg  Qhs as needed...     Acoustic Neuroma> this was resected by DrBrowne [facility] 2002; last MRI 2/11 continued to look good- post op changes, no recurrent tumor seen; we discussed f/u MRI here since it hasn't been done by DrBrowne in the interval...    Anxiety> on Tranxene as needed... We reviewed prob list, meds, xrays and labs> see below for updates >>   CXR 7/14> she forgot to go to the [place] for this film...   LABS 7/14:  FLP- looks ok on Prav40;  Chems- wnl;  CBC- wnl;  TSH=2.47;  VitD=67... ADDENDUM>  DrMorris ([facility], Interventional radiology) insists that we obtain her f/u CTAngiogram & send it to him, we will also obtain f/u MRI Brain to assess her acoustic neuroma...  CTA Head 9/14 showed s/p stent assisted coiling of basilar tip aneurysm, no aneurysm recurrence, patent basilar art & left post cerebral art, mild atherosclerotic calcif in cavernous carotids w/o stenosis, post op right mastoid changes w/o recurrent tumor  MRI Brain 9/14 showed no evid of resid or recurrent vestib schwannoma on the right, post surg changes w/ scarring in the int auditory canal, ols sm vessel cerebellar infarctions are stable, mild atrophy & sm vessel dis, prev coiled basilar tip aneurysm, no acute insults...  ADDENDUM> Letter from DrMorris 9/14 after review of the CTA indicates that everything is stable and flow is satisfactory (he is leaving [place] can assist in the future if needed).  ~  July 09, 2013:  48mo ROV & Ana Santos is c/o 1wk hx palpit, skipping, & weak feeling; this occurs several times a day, lasts 30+minutes, but not assoc w/ CP/ dizzy/ syncope; she is still smoking ~1/2ppd & drinks 1-2 cups of coffee; we discussed the need for further eval w/ CXR, EKG, 2DEcho & Holter monitor;  her husb sees DrMcLean for Cards... Her other CC today is chronic daily HAs- they are frontal & generalized, likely mixed musc contraction/ migraines; she has Tramadol & takes this Tid, she may add Tylenol as well, & we discussed trial of Topamax25mg - 1=>2 daily and we will refer to Neuro if not responding... We reviewed the following medical problems during today's office visit >>     COPD> still smoking 1/2ppd & can't vs won't quit; not on meds, denies cough/ sputum/ hemoptysis/ SOB/ ch in DOE, etc; CXR today 1/15= COPD, NAD; she MUST quit all smoking!    Palpit> new complaint 1/15> w/u in progress & asked to STOP SMOKING & avoid caffeine etc...     Cerebrovasc dis/ Aneurysm> on ASA325, Plavix75; last f/u DrMorris IR at [facility]  8/12 w/ CTA that looked good- no resid aneurysm, distal flow appeared good & he didn't rec any changes; he requested that we do her f/u CTA & MRI Brain here in [place] 9/14=> good flow thru the stented basilar art, no recurrent aneurysm, no recurrent acoustic neuroma...     Hyperlipid> on Prav40; tol well & FLP 7/14 shows TChol 174, TG 68, HDL 58, LDL 103; continue same...    Hypothyroid> on Levothy75; labs 7/14 shows TSH= 2.47    DJD/ FM> she is managing quite well w/ prn Tramadol & Tylenol; prev CWP resolved...    Osteopenia> she had min osteopenia on BMD several yrs ago & takes Calcium, MVI, Vit D 1000u daily; due for f/u BMD- pending...    RLS> on Mirapex 0.25mg  Qhs as needed...     Acoustic Neuroma> this was resected by DrBrowne [facility] 2002; last MRI 2/11 continued to look good- post op changes, no recurrent tumor seen; we discussed f/u MRI here, done 9/14 and continues to look good- no evid for recurrent or resid tumor...    Anxiety> on Tranxene as needed... We reviewed prob list, meds, xrays and labs> see below for updates >> she had the 2014 Flu vaccine 12/14...   CXR 1/15 showed norm heart size, COPD/Emphysema w/ scarring in lingula, NAD.Marland KitchenMarland Kitchen  EKG 1/15 showed NSR, rate60, poor R prog V1-3, NSSTTWA, NAD...  2DEcho 2/15 showed normal LV size & function w/ EF=55-60%, no regional wall motion abnormalities, Gr1DD, norm valves and RV...  Holter Monitor 2/15 showed NSR, PACs, PVCs, and no pathologic arrhythmias (the skips correspond to her subjective SOB/palpit)...   ~  August 12, 2013:  69mo ROV & add-on visit post hospital check>  Ana Santos developed diplopia & blurry vision and was adm to [facility] 2/15 - 07/30/13> review of records indicated that her neuro exam was otherw WNL, MRI Brain showed a right occipital lobe infarct, and MRA showed no new abnormality- no recurrent aneurysm, patent sent & good flow in the PCA; symptom resolved spont & they continued her same meds (FTD322 & Plavix75); she also  had a UTI treated w/ Rochephin while in the hosp... She denies any recurrent visual prob, speech prob, focal weakness or sensory changes; she has been able to cut down to 1cig/d and again we reviewed the critical need to quit smoking completely... She notes that she was unable to tolerate the Topamax (dizzy & nausea) tried last OV for the HAs- but she notes the HAs are diminished & respond to Tramadol/ Tylenol when needed... We discussed referral to [facility] Neurology division to one of their cerebrovasc dis/ stroke specialists for their recommendations in light of her prev issues...     We  reviewed prob list, meds, xrays and labs> see below for updates >> Tramadol & Mirapex refilled today per request...  CT Brain 07/27/13 [facility] showed prior basilar tip aneurysm repair, post surg changes in the right mastoid region, NAD...  MRI Brain 2/15 showed sm areas of acute infarct in right occipital lobe (new from 9/14 MRI); chr infarcts in the cerebellum bilat, basilar art stent & coiling of basilar tip aneurysm...  MRA Brain 2/15 showed no evid for recurrent basilar tip aneurysm, mild to mod narrowing of the right P1 segment, left P1 segment stented...  CDopplers 2/15 showed some plaque in the bulbs and ICAs bilat, <50% stenoses bilat noted...   EKG 2/15 showed NSR, rate63, poor R prog V1-3, NAD...  LABS 2/15 at [facility] showed> Chems- wnl w/ Cr=1.0;  CBC- wnl w/ Hg=13.5;  Abn UA w/ UTI evident...   ~  Oct 15, 2013:  68mo ROV & post ER visit> Ana Santos went to the ER 08/26/13 with another TIA characterized by diplopia & ataxia that last 24min or so; assoc mild HA, no focal neuro findings in ER; already on ASA325/ Plavix75 w/ good compliance; SEE PREV EVAL 2/15... They did CTA via ER (results below- NAD) and she was disch on same meds... She had f/u Neuro eval at [facility] 08/20/13 (prior to this ER visit)> DrGomadam NOTE REVIEWED, they ordered CTA (subseq done via [facility]), & and Echo w/ bubble study... She denies  any additional cerebral ischemic symptoms since the 3/15 ER visit; unfortunately still smoking; review of chart shows NO documented AFib in her past... She has a f/u Neuro appt in [place] in June...    She states breathing is good, still smoking 3-4cig/d, denies cough/ sput/ hemoptysis/ SOB; BP is 140/70 & she denies CP, palpit, dizzy, edema...     She remains on Pravastatin40 for her Chol; FLP 5/15 showed TChol 164, TG 120, HDL 49, LDL 91...    She is clinically stable on her Levothyroid25mcg/d but last 5/15 showed TSH= 0.25 & she is rec to decr dose to 43mcg/d (we will f/u TSH on return)... We reviewed prob list, meds, xrays and labs> see below for updates >>   EKG 3/15 showed NSR, rate67, poor R prog V1-3, otherw wnl...  CT Head 3/15 showed evid prior vertebral art aneurysm surg & post-op changes in right mastoid, NAD...  CTA Head & Neck 3/15 showed prev basilar tip aneurysm coiling & basilar stent, prior right mastoidectomy, vessels are patent & no recurrent aneurysm; neck showed apical scarring & emphysema, 40% left subclav stenosis, 40-50% bilat ICA stenoses, mild stenosis at the origin of the vertebral as well...  LABS 5/15:  FLP- at goals on Prav40;  Chems & LFTs- wnl;  TSH= 0.25 on Levothy75 & rec to decr to 71mcg/d...   ~  April 17, 2014:  37mo ROV & Ana Santos is c/o HA- sharp pains in her temples, assoc w/ occas weak spells ("don't feel like getting OOB"), poor appetite, and occas falling (no known injury); she remains on ASA325 & Plavix75> she voiced discontent w/ Neuro eval at [facility], states she never received call from her doctor (Neuro- DrHusseini);  I reviewed Care Everywhere records- summary & last note 03/11/14 from DrHusseini> extensive note reviewed, pt stable on ASA/ Plavix and no changes made, she was asked to f/u prn;  [place]'s BP, Chol, BS- all well controlled but she is still smoking and can't vs won't quit;  She requests 2nd opinion from local [facility] due to her  temporal HAs,  falling, weak spells...  We reviewed the following medical problems during today's office visit >>     COPD> still smoking 1/2ppd & can't vs won't quit; not on meds, min cough/ sputum, denies hemoptysis/ SOB/ ch in DOE, etc; CXR 1/15= COPD, NAD; she MUST quit all smoking!    Palpit> new complaint 1/15> w/u in progress & asked to STOP SMOKING & avoid caffeine etc; Care Everywhere shows 30d Holter w/ PACs/ PVCs/ no AFib...    Cerebrovasc dis/ Aneurysm> on ASA325, Plavix75; she sees DrMorris IR at [facility] 8/12 w/ CTA that looked good- no resid aneurysm, distal flow appeared good & he didn't rec any changes; he requested that we do her f/u CTA & MRI Brain in [place] 9/14=> good flow thru the stented basilar art, no recurrent aneurysm, no recurrent acoustic neuroma;  Then she was [facility] 2/15 w/ TIA & MRI/MRA Brain showed acute infarct in right occipital lobe, otherw stable;  Subsequent f/u by [facility] Neurology- no change in meds, rec quit smoking and control of BP, BS, Chol...    Hyperlipid> on Prav40; tol well & FLP 11/15 shows TChol 168, TG 129, HDL 43, LDL 99; continue same + diet...    Hypothyroid> on Levothy50 now; labs 11/15 shows TSH= 9.91 and she is rec to increase Synthroid back to 30mcg/d...    DJD/ FM> she is managing quite well w/ prn Tramadol & Tylenol; prev CWP resolved...    Osteopenia> she had min osteopenia on BMD several yrs ago & takes Calcium, MVI, Vit D 1000u daily; f/u BMD 2/15 showed TScores -0.6 in Spine, and -1.6 in right FemNeck; rec to stay on supplements & wt bearing exercise...    RLS> on Mirapex 0.25mg  Qhs as needed...     Acoustic Neuroma> this was resected by DrBrowne [facility] 2002; MRI 2/11 continued to look good- post op changes, no recurrent tumor seen; f/u MRI here 9/14 (& [facility] 2/15) continues to look good- no evid for recurrent or resid tumor...    Anxiety> on Tranxene as needed... We reviewed prob list, meds, xrays and labs> see below for updates >> OK Flu shot  today...  LABS 11/15:  FLP- at goals on Prav40;  Chems- wnl;  CBC- wnl;  TSH=9.91;  Sed=36... PLAN>>  She desperately need to quit smoking, declines smoking cessation help;  TSH is elev on Synthroid50 & she confirms daily dosing, therefore incr back to Synthroid75;  We will refer to [facility] for 2nd opinion regarding her cerebrovasc dis, HAs, falling, weak spells...   ~  August 03, 2014:  44mo ROV & acute add-on visit requested by Neuro-DrJaffe due to CP & irreg heartbeat> Ana Santos was at [facility] office today (for f/u of headaches- improved w/ Neurontin but intol she said due to incr palpit) & mentioned left CP & palpit, he noted skips on exam & called for a work-in appt here;  She has a cardiac hx- followed for yrs by DrStuckey w/ cath 1995 showing min nonobstructive CAD & mild MVP; she developed palpit in 2015 w/ abn EKG (poor R prog, NSTTWA)) and 2DEcho showing norm LVF, no regional wall motion abn, Gr1DD, normal valves & RV; Holter monitor revealed NSR w/ some PACs & PVCs but no dangerous arrhythmias- she was asked to avoid caffeine, pseudophed, and QUIT SMOKING (she still smokes ~1/2ppd)... As noted she has severe cerebrovasc dis & hx Acoustic Neuroma w/ prev gamma knife surg at [facility] in 2002; a 30d Event monitor  at [facility] was said to reveal PACs, PVCs, but no AFib... Current symptoms sound similar to prev w/ skipping sensation, no racing, sl SOB, mild chest discomfort on & off daily x 3-4wks (sharp, not sore to touch, sl worse w/ movement); she does not exercise & her most strenuous activity is housework...    We reviewed prob list, meds, xrays and labs> see below for updates >>   CXR 2/16 showed norm heart size, linear atx vs scarring left base, otherw neg- NAD.Marland KitchenMarland Kitchen  EKG today showed NSR, rate80, occ PVCs noted, poor R prog V1=>3, NSSTTWA... PLAN>>  We decided to start METOPROLOL 25mg - 1/2 tab Bid for now & refer to Cards for f/u eval; reminded about smoking cessation but she declines additional  counseling; her husb sees DrMcLean...  ~  Oct 20, 2014:  15mo ROV & Ana Santos reports improved after cardiac eval by DrArida (see below)- neg cath, neg 2DEcho, palpit improved w/ Metoprolol & decr caffeine etc; unfortunately she continues to smoke & will not quit, refuses smoking cessation meds etc... She is in the middle of bilat cat surg by DrBevis... No new complaints or concerns- denies cough, sput, hemoptysis, SOB, CP, f/c/s, etc; she states her main exercise is yard work... We reviewed prob list, meds, xrays and labs> see below for updates >>  PLAN>> she reports improved on current med regimen; we again discussed the need for smoking cessation, take meds regularly; she will f/u in 47mo w/ Fasting labs & call in the interim prn...          Problem List:   COPD (ICD-496) - she has a min smoker's cough, sm amt beige sputum... ~  CXR 5/08 w/ chr changes, LLL scar, NAD. ~  CXR 7/09 showed norm hrt size, clear lungs, NAD. ~  CXR 9/10 showed NAD. ~  CXR 10/11 showed chr changes, NAD. ~  CXR 12/12 showed COPD, NAD. ~  CXR 4/13 showed normal heart size, incr interstitial markings, NAD. ~  CXR 1/15 showed norm heart size, COPD/Emphysema w/ scarring in lingula, NAD. ~  CXR 2/16 showed norm heart size, linear atx vs scarring left base, otherw neg- NAD  CIGARETTE SMOKER (ICD-305.1) - can't vs won't quit and not interested in smoking cessation help... now she states she's decr to ~1/4-1/2 ppd but can't seem to improve from there... ~  She understands the critical need to quit smoking from the COPD/Pulm, Cardiac, & Vascular/Stroke standpoints;  She declines offers for smoking cessation help, chantix, nicotine replacement, etc... ~  2/15:  She was hosp at [facility] w/ Diplopia & found to have a right occipital stroke=> she has decr smoking to <1cig/d & encouraged to quit completely!!! ~  5/15 to 11/15:  She is still smoking 3-4cig/d she says & I have begged her to quit, discussed nicotine replacement rx  & alternatives... ~  2/16: she is still smoking <1/2ppd she says but again declines smoking cessation help...  ?MITRAL VALVE PROLAPSE (ICD-424.0) >> mild MVP seen on cath 1995 by DrStuckey... Hx of CHEST PAIN (ICD-786.50) >> she had CP in the past and a neg cath 1995 (min coronary irregularities)...  Hx PALPITATIONS >> PVCs and PACs seen on EKG & Holter monitor in 2015; asked to stop all caffeine & nicotine... ~  CP eval 1995 w/ cath showing min 10% luminal irreg RCA & mild MVP, norm LVF... ~  [place] was neg- no ischemia, no infarct, EF=66%... ~  baseline EKG showed NSR, NSSTTWA (poor R prog V1-V3)... ~  EKG 1/15 showed NSR, rate60, poor R prog V1-3, NSSTTWA, NAD... ~  2DEcho 2/15 showed normal LV size & function w/ EF=55-60%, no regional wall motion abnormalities, Gr1DD, norm valves and RV ~  Holter Monitor 2/15 showed NSR, PACs, PVCs, and no pathologic arrhythmias (the skips correspond to her subjective SOB/palpit)...  ~  She had another 30d Holter per [facility] w/ Care Everywhere report indicating PACs & PVCs, no AFib... ~  EKG 2/16 showed NSR, rate80, PVCs noted, poor R prog V1=>3, NSSTTWA... She noted incr palpit & CP => referred to Cards for their review... ~  Seen by DrArida 2/16> Cath showed minor luminal irregularities w/o obstructive CAD, norm LVF w/ EF=55%, no signif MR; symptoms felt to be due to PVCs & Rx w/ Metoprolol improved her symptoms...  ~  2DEcho 3/16 showed norm LV size & function w/ EF=65-70%, no regional wall motion abn, Gr1DD, norm valves, norm RV & PAsys...  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 referred to [facility] w/ Angiogram confirmation & subseq stent assisted coiling of the aneurysm 11/10... subseq f/u angiogram 2/11 showed complete angiographic obliteration of the aneurysm, & patent stent in distal basilar art (there was a new 50%stenosis in the right PCA)... on ASA  325mg /d & PLAVIX restarted 2/11 w/ new gait abn & cbll infarct on MRI. ~  she saw DrMorris [facility]- interventional neuroradiology- w/ another arteriogram performed 8/11- it showed satis appearance of the basilar aneurysm stent & coiling w/ some prolapse of the coils into the P1 segm w/ mild compromise of the post cerebral art (flow looks adeq & he rec continued ASA/ Plavix therapy & f/u 57yr). ~  She had f/u DrMorris 8/12 w/ CTA that looked good- no resid aneurysm, distal flow appeared good & he didn't rec any changes- f/u planned 12yrs. ~  7/14: she is due for her f/u CTA & we will call [facility] regarding a follow up appt w/ DrMorris.. ~  9/14:  on BSJ628, Plavix75; last f/u DrMorris IR at [facility] 8/12 w/ CTA that looked good- no resid aneurysm, distal flow appeared good & he didn't rec any changes; he requested that we do her f/u CTA & MRI Brain here in [place] 9/14=> good flow thru the stented basilar art, no recurrent aneurysm, no recurrent acoustic neuroma...  ~  CTA Head 9/14 showed s/p stent assisted coiling of basilar tip aneurysm, no aneurysm recurrence, patent basilar art & left post cerebral art, mild atherosclerotic calcif in cavernous carotids w/o stenosis, post op right mastoid changes w/o recurrent tumor. ~  2/15: Adm to [facility] w/ Dip[lopia & eval revealed right occipital lobe infarct, everything else was stable, continued on her ASA325/ Plavix75=> we will refer to [facility] for their review of her situation... ~  3/15: she had a busy March> f/u w/ Neuro in [place] then had another TIA & presented to the ER; CTA Head & Neck showed similar to prev, no recurrent aneurysm, & they disch her on same ASA325 + Plavix75... ~  9/15: she had [facility]usseini [facility], note reviewed on Care Everywhere- felt to be stable & no changes made> rec to quit smoking & control BP, BS, Chol; they released her to f/u prn... ~  11/15: pt upset that Neuro didn't address her c/o temporal HAs, falling, weak spells- we will refer to  [place] [facility] per her request...  ~  2/16: pt seen by DrJaffe> Hx basilar  tip aneurysm s/p coiling 2010, right cbll infarct, right vestib schwannoma s/p surg 2002, recurrent TIA, & HAs; they reied Gabapentin=> HA improved but she developed CP & thought it was from this med therefore stopped it; she was advised to try MelatoninQhs & limit her Tramadol rx, f/u 39mo...  VENOUS INSUFFICIENCY (ICD-459.81) - she tells me that she saw DrKrush for vein surgery and treatment ("it's covered by my husb's insurance").  HYPERLIPIDEMIA (ICD-272.4) - now on PRAVASTATIN 40mg /d & tol well... we reviewed low chol/ low fat diet. ~  chart review shows TChol ~ 200 range in the 80's and early 90's. ~  then TChol incr to ~250 range in late 90's and Lipitor started-  ~  FLP 5/08 on Lipitor 10mg /d showed TChol 164, TG 153, HDL 45, LDL 89. ~  in 2009 c/o no energy & arm discomfort she felt was from the Lip10- therefore [place]. ~  [place] 7/09 on diet alone showed TChol 234, TG 218, HDL 40, LDL 139... rec- start Prav40. ~  FLP 3/10 on Prav40 showed TChol 182, Tg 137, HDL 47, LDL 107 ~  FLP 6/11 on Prav40 showed TChol 162, TG 230, HDL 37, LDL 88 ~  FLP 6/12 on Prav40 showed TChol 177, TG 139, HDL 44, LDL 105... Contin diet, take med every day. ~  FLP 6/13 on Prav40 showed TChol 151, TG 138, HDL 42, LDL 81  ~  FLP 7/14 on Prav40 showed TChol 174, TG 68, HDL 58, LDL 103 ~  FLP 5/15 on Prav40 showed TChol 164, TG 120, HDL 49, LDL 91 ~  FLP 11/15 on Prav40 showed TChol 168, TG 129, HDL 43, LDL 99   HYPOTHYROIDISM (ICD-244.9) - stable on LEVOTHYROID 73mcg/d now... hx Hashimoto's thyroiditis in 1989 w/ markedly pos antimicrosomal antibodies and transient hyperthy labs...  ~  labs 5/08 showed TSH= 0.53... rec- continue Levo100/d. ~  labs 7/09 showed TSH= 0.07... rec- decr Levothy100 to 1/2 daily. ~  labs 3/10 showed TSH= 17.71... rec> incr back to 169mcg/d. ~  labs 9/10 showed TSH= 0.21... continue same. ~  labs 6/11 showed TSH=  0.10... rec decr Levo100 to 1/2 daily. ~  labs 10/11 on Levo50 showed TSH= 1.25 (FreeT3 & FreeT4 normal) ~  Labs 6/12 on Levo50 showed TSH= 12.56 ==> we will contact pt & pharm to determine if taking Levo100-1/2tab daily every day? & adjust. ~  Labs 12/12 on Levo75 showed TSH= 0.88... Continue same. ~  Labs 6/13 on Levo75 showed TSH= 0.71... Continue same. ~  Labs 7/14 on Levo75 showed TSH= 2.47 ~  Labs 5/15 on Levo75 showed TSH= 0.25 and we rec decr to Levothy50... ~  Labs 11/15 on Levo50 showed TSH= 9.91 and she is rec to go back on Synthroid75  IRRITABLE BOWEL SYNDROME (ICD-564.1) - had colonoscopy by [facility] 11/06 that was WNL.Marland Kitchen. she notes some constipation & Rx w/ colase/ senakot-S OTC...  NEPHROLITHIASIS (ICD-592.0) - last stone passed in 1993, no known recurrence since then...  Hx of TENDINITIS, RIGHT ELBOW (ICD-727.09) Hx right shoulder pain w/ eval by DrNorris (MRI was planned butnever done & symptoms improved spont)... FIBROMYALGIA (ICD-729.1) - prev severe symptoms may have reflected a flair in her FM> but much improved on Pred Rx. ~  labs 9/10 showed Sed= 24 ~  labs 6/11 showed Sed= 30 ~  labs 10/11 showed CBC=norm, Chems=norm, TFTs=norm, Sed=21, RF=neg, ANA+1:40 speckled... ~  12/11:  pt saw DrTruslow & his note is pending- she indicates he stopped her Pred, & gave  her shot in shoulder.  OSTEOPENIA (ICD-733.90) - BMD here 7/09 showed TScores -0.8 in Spine, and -1.2 in right FemNeck... rec to take Calcium, MVI, Vit D... ~  BMD repeated 2/15 & showed TScores -0.6 in Spine, and -1.6 in right FemNeck; rec to stay on Calcium, MVI, VitD, & wt bearing exercise...  VITAMIN D DEFICIENCY (ICD-268.9) ~  labs 7/09 showed Vit D level = 16... rec- start Vit D 50K weekly. ~  labs 3/10 showed Vit D level = 69... rec> change to 1000 u daily. ~  labs 6/11 showed Vit D level = 55... Continue same. ~  Labs 6/13 showed Vit D level = 53... Continue same. ~  Labs 7/14 showed Vit D level =  67  Hx of ACOUSTIC NEUROMA (ICD-225.1) - s/p surg for this tumor 10/02 at [facility] DrBrowne... no known recurrence... ~  MRI Brain 2/07 showed s/p translabyrinthine approach to right acoustic neuroma w/o recurrence, NAD... ~  MRI Brain 10/10 showed no recurrence of tumor, but 4-80mm basilar tip aneurysm detected & Rx as above. ~  MRI Brain 2/11 showed no recurrence/ post op changes, endovasc oblit of basilar aneurysm, cerebellar lacunar infarct & 50% stenosis of right PCA... PLAVIX restarted... she had review by [facility] IR & Neurology (as above)... ~  We discussed f/u MRI since DrBrowne hasn't seen her since 2011 (?he turned her over to DrMorris)... ~  9/14:  MRI Brain showed no evid of resid or recurrent vestib schwannoma on the right, post surg changes w/ scarring in the int auditory canal, ols sm vessel cerebellar infarctions are stable, mild atrophy & sm vessel dis, prev coiled basilar tip aneurysm, no acute insults...  ~  MRI/ MRA Brain 2/15 at [facility] showed acute infarct in right occipital lobe, chronic infarcts in cerebellum bilat, aneurysm coiling of basilar art tip and stent in basilar art.. ~  CT Angio Head & Neck 3/15 in [place] showed 40% narrowing of prox left subclavian art; 40% RICAstenosis & 47% LICAstenosis; aneurysm coiling & stenting of basilar tip aneurysm...  RLS symptoms >> she complained 1/14 about new onset RLS symptoms- discussed trial Mirapex 0.25mg  prn...  ANXIETY (ICD-300.00) - uses CHLORAZEPATE 7.5mg Tid... she gets claustrophobic and needs sedation before MRIs.   Past Surgical History  Procedure Laterality Date  . Translabyrinthine procedure  2002    [facility] Dr. Vicie Mutters  . Total abdominal hysterectomy    . Breast lumpectomy    . Aneurysm coiling  11-10    Dr. Estanislado Pandy  . Left heart catheterization with coronary angiogram N/A 08/05/2014    Procedure: LEFT HEART CATHETERIZATION WITH CORONARY ANGIOGRAM;  Surgeon: Wellington Hampshire, MD;  Location: [place] CATH LAB;  Service:  Cardiovascular;  Laterality: N/A;    Outpatient Encounter Prescriptions as of 10/20/2014  Medication Sig  . acetaminophen (TYLENOL) 500 MG tablet Take 500 mg by mouth every 6 (six) hours as needed for mild pain.   Marland Kitchen aspirin 325 MG tablet Take 325 mg by mouth daily.    . calcium gluconate 500 MG tablet Take 1 tablet by mouth daily.  . cholecalciferol (VITAMIN D) 1000 UNITS tablet Take 1,000 Units by mouth daily.    . clopidogrel (PLAVIX) 75 MG tablet TAKE 1 TABLET DAILY  . clorazepate (TRANXENE) 7.5 MG tablet Take 1 tablet (7.5 mg total) by mouth 3 (three) times daily as needed for anxiety.  . hydroxypropyl methylcellulose / hypromellose (ISOPTO TEARS / GONIOVISC) 2.5 % ophthalmic solution Place 1 drop into both eyes as needed for  dry eyes.  Marland Kitchen levothyroxine (SYNTHROID) 75 MCG tablet Take 1 tablet (75 mcg total) by mouth daily before breakfast.  . metoprolol tartrate (LOPRESSOR) 25 MG tablet Take 1 tablet (25 mg total) by mouth 2 (two) times daily.  . pramipexole (MIRAPEX) 0.25 MG tablet TAKE 1 TABLET AT BEDTIME FOR RESTLESS LEG SYMPTOMS  . pravastatin (PRAVACHOL) 40 MG tablet TAKE 1 TABLET DAILY  . senna-docusate (SENOKOT-S) 8.6-50 MG per tablet Take 1 tablet by mouth at bedtime.   . traMADol (ULTRAM) 50 MG tablet Take 1 tablet (50 mg total) by mouth 3 (three) times daily as needed for moderate pain.   No facility-administered encounter medications on file as of 10/20/2014.    Allergies  Allergen Reactions  . Atorvastatin Other (See Comments)    REACTION: INTOL to Lipitor w/ arm pain (3/09)  . Hydrocodone-Acetaminophen Other (See Comments)    REACTION: hallucinations  . Morphine Other (See Comments)    REACTION: hallucinations  . Topamax [Topiramate] Nausea And Vomiting    *dizziness*    Current Medications, Allergies, Past Medical History, Past Surgical History, Family History, and Social History were reviewed in [employer] record.   Review of Systems          See HPI - all other systems neg except as noted... The patient complains of dyspnea on exertion, recent palpitations & HAs.  The patient denies anorexia, fever, weight loss, weight gain, vision loss, decreased hearing, hoarseness, chest pain, syncope, peripheral edema, prolonged cough, hemoptysis, abdominal pain, melena, hematochezia, severe indigestion/heartburn, hematuria, incontinence, muscle weakness, suspicious skin lesions, transient blindness, difficulty walking, depression, unusual weight change, abnormal bleeding, enlarged lymph nodes, and angioedema.     Objective:   Physical Exam     WD, WN, 74 y/o WF in NAD... GENERAL:  Alert & oriented; pleasant & cooperative... HEENT:  [place]/AT, EOM-wnl, PERRLA, EACs-clear, TMs- some scarring on right, NOSE-clear, THROAT-clear & wnl. NECK:  Supple w/ fairROM; no JVD; normal carotid impulses w/o bruits; palp thyroid, no nodules felt; no lymphadenopathy. CHEST:  Clear to P & A; without wheezes/ rales/ or rhonchi heard... HEART:  Regular Rhythm; gr1/6 SEM,  no irregularity, rubs or gallops heard... ABDOMEN:  Soft & nontender; normal bowel sounds; no organomegaly or masses detected. EXT: without deformities, mild arthritic changes; no varicose veins/ venous insuffic/ or edema... +trigger points about the neck/ shoulders w/ decr ROM right shoulder w/ pain... NEURO:  CN's intact; no focal neuro deficits... gait & station are normal. DERM:  No lesions noted; no rash etc...  RADIOLOGY DATA:  Reviewed in the EPIC EMR & discussed w/ the patient...  LABORATORY DATA:  Reviewed in the EPIC EMR & discussed w/ the patient...   Assessment & Plan:    COPD/ Smoker>  She notes min cough/ sputum/ etc; desperately needs to quit smokng- offered counseling, medications, etc but she declines...  Hx mild MVP>  No MVP seen on 2DEcho 2/15, mild MVP was noted on cath 1995 by DrStuckey...  PALPITATIONS>> Prev eval w/ CXR, EKG, 2DEcho & Holter monitor=> all studies  OK/NEG as above (x PACs/ PVCs); we stressed the importance of no nicotine, no caffeine, etc... 2/16> she presented w/ incr symptoms over the last month; rec to start Metop25- 1/2Bid & refer to Cards for their review... 2/16> seen by DrArida w/ Cath, repeat 2DEcho- both neg & palpit/ PVCs improved w/ Metoprolol rx...  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 Right Occipital Lobe Infarct 2/15>  As above, she remains on ASA325 & Plavix75, we will refer to [facility] Neurology division for their recommendations... Another TIA 3/15>  eval via ER w/ CTA Head & Neck- stable, no changes made to meds- rec to continue ASA/ Plavix, AND QUIT SMOKING.... She was released by [facility] Neuro- DrHusseini & asked to quit smoking, continue ASA/Plavix, keep BP/ BS/ Chol under control... 11/15> pt c/o HAs, falls, weakness & requests Neuro 2nd opinion at [facility]... 2/16> seen by [facility] for HAs & neuro 2nd opinion...  HYPERLIPID>  On Prav40 + diet;  FLP looks OK but needs better diet & take med Qhs...  HYPOTHYROID>  Back on Levothy 14mcg/d now w/ TSH= 9.91 last OV on 28mcg/d...  IBS w/ Constip>  She uses OTC laxatives as needed & we discussed Miralax/ Senakot-S/ etc...  ORTHO>  Prev right shoulder discomfort improved spont & MRI was never done; currently uses Tramadol Prn...  OSTEOPENIA/ Vit D defic>  Stable  on Calcium, MVI, Vit D supplement; due for f/u BMD=> pending...  RLS symptoms>  trial Mirapex 0.25mg  hs prn...  Hx Chronic daily HAs>>  HAs improved spontaneously- on Tramadol & Tylenol w/ some relief of pain; she tried Topamax but intol w/ dizzy & nausea... 11/15> presented w/ incr HAs and referred to Neuro; they tried Neurontin w/ decr HAs but intol she says...  ANXIETY>  Uses Tranxene Prn (she has some claustrophobia & needs sedation before MRIs- she doesn't remember the 2/11 MRI being done!).Marland KitchenMarland Kitchen   Patient's Medications  New Prescriptions   No medications on file  Previous Medications   ACETAMINOPHEN (TYLENOL) 500 MG TABLET    Take 500 mg by mouth every 6 (six) hours as needed for mild pain.    ASPIRIN 325 MG TABLET    Take 325 mg by mouth daily.     CALCIUM GLUCONATE 500 MG TABLET    Take 1 tablet by mouth daily.   CHOLECALCIFEROL (VITAMIN D) 1000 UNITS TABLET    Take 1,000 Units by mouth daily.     CLOPIDOGREL (PLAVIX) 75 MG TABLET    TAKE 1 TABLET DAILY   CLORAZEPATE (TRANXENE) 7.5 MG TABLET    Take 1 tablet (7.5 mg total) by mouth 3 (three) times daily as needed for anxiety.   HYDROXYPROPYL METHYLCELLULOSE / HYPROMELLOSE (ISOPTO TEARS / GONIOVISC) 2.5 % OPHTHALMIC SOLUTION    Place 1 drop into both eyes as needed for dry eyes.   LEVOTHYROXINE (SYNTHROID) 75 MCG TABLET    Take 1 tablet (75 mcg total) by mouth daily before breakfast.   METOPROLOL TARTRATE (LOPRESSOR) 25 MG TABLET    Take 1 tablet (25 mg total) by mouth 2 (two) times daily.   PRAMIPEXOLE (MIRAPEX) 0.25 MG TABLET    TAKE 1 TABLET AT BEDTIME FOR RESTLESS LEG SYMPTOMS   PRAVASTATIN (PRAVACHOL) 40 MG TABLET  TAKE 1 TABLET DAILY   SENNA-DOCUSATE (SENOKOT-S) 8.6-50 MG PER TABLET    Take 1 tablet by mouth at bedtime.    TRAMADOL (ULTRAM) 50 MG TABLET    Take 1 tablet (50 mg total) by mouth 3 (three) times daily as needed for moderate pain.  Modified Medications   No medications on file  Discontinued Medications   No  medications on file

## 2014-10-20 NOTE — Patient Instructions (Signed)
Today we updated your med list in our EPIC system...    Continue your current medications the same...  Work on smoking cessation, and continue your exercise program...  Call for any questions...  Let's plan a follow up visit in 22mo w/ fasting blood work.Marland KitchenMarland Kitchen

## 2014-10-26 DIAGNOSIS — H25812 Combined forms of age-related cataract, left eye: Secondary | ICD-10-CM | POA: Diagnosis not present

## 2014-10-26 DIAGNOSIS — H2512 Age-related nuclear cataract, left eye: Secondary | ICD-10-CM | POA: Diagnosis not present

## 2014-11-03 ENCOUNTER — Ambulatory Visit: Payer: Medicare Other | Admitting: Neurology

## 2014-12-21 ENCOUNTER — Other Ambulatory Visit: Payer: Self-pay | Admitting: Pulmonary Disease

## 2015-01-04 ENCOUNTER — Other Ambulatory Visit (INDEPENDENT_AMBULATORY_CARE_PROVIDER_SITE_OTHER): Payer: Medicare Other

## 2015-01-04 ENCOUNTER — Ambulatory Visit (INDEPENDENT_AMBULATORY_CARE_PROVIDER_SITE_OTHER): Payer: Medicare Other | Admitting: Adult Health

## 2015-01-04 ENCOUNTER — Encounter: Payer: Self-pay | Admitting: Adult Health

## 2015-01-04 VITALS — BP 132/70 | HR 58 | Temp 98.5°F | Ht 62.0 in | Wt 127.0 lb

## 2015-01-04 DIAGNOSIS — E038 Other specified hypothyroidism: Secondary | ICD-10-CM | POA: Diagnosis not present

## 2015-01-04 DIAGNOSIS — R3915 Urgency of urination: Secondary | ICD-10-CM

## 2015-01-04 DIAGNOSIS — N3000 Acute cystitis without hematuria: Secondary | ICD-10-CM

## 2015-01-04 LAB — URINALYSIS, ROUTINE W REFLEX MICROSCOPIC
Bilirubin Urine: NEGATIVE
Ketones, ur: NEGATIVE
Nitrite: NEGATIVE
Specific Gravity, Urine: 1.01 (ref 1.000–1.030)
Total Protein, Urine: NEGATIVE
URINE GLUCOSE: NEGATIVE
Urobilinogen, UA: 0.2 (ref 0.0–1.0)
pH: 6 (ref 5.0–8.0)

## 2015-01-04 MED ORDER — CIPROFLOXACIN HCL 250 MG PO TABS: 250.0000 mg | ORAL_TABLET | Freq: Two times a day (BID) | ORAL | Status: AC

## 2015-01-04 NOTE — Assessment & Plan Note (Signed)
On return needs repeat TSH

## 2015-01-04 NOTE — Progress Notes (Signed)
   Subjective:    Patient ID: Ana Santos, female    DOB: 10/04/1940, 74 y.o.   MRN: [id number]  HPI 74 year old female, smoker with a known history of hyperlipidemia, hypothyroidism, osteoarthritis  01/04/2015 acute office visit Patient presents for an acute office visit Patient complains of 2-3 week history of weakness, loss of appetite, urinary urgency and frequency. She denies any back pain, nausea, vomiting, diarrhea, hematuria, chest pain, orthopnea, PND or leg swelling. Feels that she has to go the bathroom but does not empty her bladder. Has been going to the bathroom multiple times to urinate especially at night. Feels weak with low energy.  Review of Systems Constitutional:   No  weight loss, night sweats,  Fevers, chills, fatigue, or  lassitude.  HEENT:   No headaches,  Difficulty swallowing,  Tooth/dental problems, or  Sore throat,                No sneezing, itching, ear ache, nasal congestion, post nasal drip,   CV:  No chest pain,  Orthopnea, PND, swelling in lower extremities, anasarca, dizziness, palpitations, syncope.   GI  No heartburn, indigestion, abdominal pain, nausea, vomiting, diarrhea, change in bowel habits, loss of appetite, bloody stools.   Resp: No shortness of breath with exertion or at rest.  No excess mucus, no productive cough,  No non-productive cough,  No coughing up of blood.  No change in color of mucus.  No wheezing.  No chest wall deformity  Skin: no rash or lesions.  GU: no dysuria, +  urgency  frequency.  No flank pain, no hematuria   MS:  No joint pain or swelling.  No decreased range of motion.  No back pain.  Psych:  No change in mood or affect. No depression or anxiety.  No memory loss.         Objective:   Physical Exam GEN: A/Ox3; pleasant , NAD, well nourished   HEENT:  [place]/AT,  EACs-clear, TMs-wnl, NOSE-clear, THROAT-clear, no lesions, no postnasal drip or exudate noted.   NECK:  Supple w/ fair ROM; no JVD; normal  carotid impulses w/o bruits; no thyromegaly or nodules palpated; no lymphadenopathy.  RESP  Clear  P & A; w/o, wheezes/ rales/ or rhonchi.no accessory muscle use, no dullness to percussion  CARD:  RRR, no m/r/g  , no peripheral edema, pulses intact, no cyanosis or clubbing.  GI:   Soft & nt; nml bowel sounds; no organomegaly or masses detected.Negative CVA tenderness No guarding or rebound [place]: Warm bil, no deformities or joint swelling noted.   Neuro: alert, no focal deficits noted.    Skin: Warm, no lesions or rashes   Urine was positive for hemoglobin, leukocytes, 0-2 WBCs and RBCs Urine culture is pending      Assessment & Plan:

## 2015-01-04 NOTE — Assessment & Plan Note (Signed)
UTI  Plan Cipro 250mg  Twice daily  For 7 days .  Push fluids  Try to empty bladder fully.  Please contact office for sooner follow up if symptoms do not improve or worsen or seek emergency care  follow up Dr. Lenna Gilford  As planned

## 2015-01-04 NOTE — Patient Instructions (Signed)
Cipro 250mg  Twice daily  For 7 days .  Push fluids  Try to empty bladder fully.  Please contact office for sooner follow up if symptoms do not improve or worsen or seek emergency care  follow up Dr. Lenna Gilford  As planned

## 2015-01-05 LAB — URINE CULTURE: Colony Count: 10000

## 2015-01-08 NOTE — Progress Notes (Signed)
Quick Note:  Called and spoke with the pt. Reviewed results and recs. Pt stated symptoms have improved. She voiced understanding and had no further questions. ______

## 2015-01-19 ENCOUNTER — Encounter: Payer: Self-pay | Admitting: Internal Medicine

## 2015-02-15 ENCOUNTER — Other Ambulatory Visit: Payer: Self-pay | Admitting: Pulmonary Disease

## 2015-02-19 ENCOUNTER — Ambulatory Visit: Payer: Medicare Other | Admitting: Neurology

## 2015-02-22 ENCOUNTER — Ambulatory Visit (INDEPENDENT_AMBULATORY_CARE_PROVIDER_SITE_OTHER): Payer: Medicare Other | Admitting: Neurology

## 2015-02-22 ENCOUNTER — Encounter: Payer: Self-pay | Admitting: Neurology

## 2015-02-22 VITALS — BP 122/80 | HR 79 | Temp 98.2°F | Resp 16 | Ht 62.0 in | Wt 125.0 lb

## 2015-02-22 DIAGNOSIS — I200 Unstable angina: Secondary | ICD-10-CM

## 2015-02-22 DIAGNOSIS — R51 Headache: Secondary | ICD-10-CM | POA: Diagnosis not present

## 2015-02-22 DIAGNOSIS — R519 Headache, unspecified: Secondary | ICD-10-CM

## 2015-02-22 DIAGNOSIS — G44201 Tension-type headache, unspecified, intractable: Secondary | ICD-10-CM

## 2015-02-22 DIAGNOSIS — I671 Cerebral aneurysm, nonruptured: Secondary | ICD-10-CM | POA: Diagnosis not present

## 2015-02-22 DIAGNOSIS — G8929 Other chronic pain: Secondary | ICD-10-CM | POA: Insufficient documentation

## 2015-02-22 DIAGNOSIS — I725 Aneurysm of other precerebral arteries: Secondary | ICD-10-CM

## 2015-02-22 DIAGNOSIS — G4485 Primary stabbing headache: Secondary | ICD-10-CM | POA: Diagnosis not present

## 2015-02-22 MED ORDER — PREDNISONE 10 MG PO TABS: ORAL_TABLET | ORAL | Status: DC

## 2015-02-22 NOTE — Patient Instructions (Signed)
1.  Take prednisone 10mg  taper.  Take 6tabs x1day, then 5tabs x1day, then 4tabs x1day, then 3tabs x1day, then 2tabs x1day, then 1tab x1day, then STOP 2.  You may increase melatonin up to 12mg  daily 3.  Limit use of tramadol to no more than 2 days out of the week 4.  Will check MRI and MRA of head 5.  Will check Sed Rate 6.  Follow up in 3 months.

## 2015-02-22 NOTE — Progress Notes (Signed)
NEUROLOGY FOLLOW UP OFFICE NOTE  [name] [id number]  HISTORY OF PRESENT ILLNESS: Ana Santos is a 74 year old right-handed woman with tobacco abuse, hypertension, hypothyroidism, restless leg, and history of basilar tip aneurysm status post coiling in 2010, right cerebellar infarct (incidental finding), right vestibular schwannoma status post resection 2002, and recurrent TIA who follows up for primary stabbing headache and chronic tension type headache.  History obtained from patient and PCP notes.  UPDATE: She is taking melatonin but cannot remember the dose.  Since last visit, she has been treated for symptomatic PVCs, causing chest pressure.  She was also treated for UTI.  She reports generalized aches and fatigue.  Over the past month, the headaches have gotten worse.  They occur daily.  In addition, she developed a new headache, described as a severe vice-like pressure, 8/10, across her forehead.  She does take tramadol three times daily for quite some time.  She takes Tylenol once in a while.  HISTORY: In 2010, she exhibited dizziness and weakness in the arms.  She was found to have a basilar tip aneurysm, which was coiled.  She has a history of recurrent stroke and TIAs.  She had a right occipital stroke on 07/27/13, presenting with diplopia, blurred vision and gait instability.  She was hospitalized and treated in [place].  MRI of the brain from 07/28/13 showed small infarcts in the right occipital lobe.  MRA showed "no recurrence of aneurysm, moderate focal narrowing R P1 segment, stented L P1 segment".  Carotid duplex showed bilateral carotid bulb and proximal ICA plaque with less than 50% stenosis.  TTE showed EF 55-60^ with mild LA dilatation.  Holter monitor showed NSR with PACs and PVCs.  She continued to have persistent dizziness.  She followed up with Dr. Marisue Brooklyn at [facility].  LDL from 08/20/13 was 85 with cholesterol of 164, TG 128 and HDL of 51.  Sed Rate was  30.  She had recurrent TIA symptoms on 08/26/13 with vertical diplopia and generalized weakness, lasting 1 hour.  She was brought to [facility] where EMS reported right dilated pupil.  Systolic blood pressure was in the 200s.  Glucose was 107-110.  CT of the brain with and without contrast and CTA of the head and neck was performed and personally reviewed, showing aneurysm tip coiling and stenting of basilar tip aneurysm with no recurrent aneurysm, no intracranial stenosis, carotid bifurcation atherosclerotic disease with mild bilateral ICA stenosis (40%).  She followed up with Dr. Marisue Brooklyn, who ordered a TTE with bubble study, which was performed on 12/02/13.  It revealed LVEF 55-60% with no right-to-left shunt.  A 30 day cardiac monitor placed 01/11/14 showed sinus rhythm with PAC and PVC.  It was noted that her symptoms were associated with PVCs.  Labs from 04/17/14 showed LDL of 99 with cholesterol of 168, TG 129 and HDL 43.10.  Sed rate was 36.  TSH was 9.91.  Glucose was 77.  In November, she began experiencing a new type of headache.  It is a stabbing pain just above the right eye, lasting up to one minute. There is associated lightheadedness, but no visual disturbance, focal numbness or weakness.  It initially occurred anywhere from once every other day to twice a day.  Since it is so brief, she doesn't take anything for it, but it is scary due to her concern of aneurysm.  She did not respond well to gabapentin.  She has chronic tinnitus in the  right ear since her vestibular schwannoma.  PAST MEDICAL HISTORY: Past Medical History  Diagnosis Date  . COPD (chronic obstructive pulmonary disease)   . Cigarette smoker   . Mitral valve prolapse   . Palpitations   . Cerebrovascular disease   . Aneurysm   . Venous insufficiency   . Hyperlipidemia   . Hypothyroidism   . IBS (irritable bowel syndrome)   . Nephrolithiasis   . Tendinitis of right elbow   . Fibromyalgia   . Osteopenia   . Vitamin D  deficiency   . AN (acoustic neuroma)   . Anxiety   . Insomnia   . Coronary artery disease     MEDICATIONS: Current Outpatient Prescriptions on File Prior to Visit  Medication Sig Dispense Refill  . acetaminophen (TYLENOL) 500 MG tablet Take 500 mg by mouth every 6 (six) hours as needed for mild pain.     Marland Kitchen aspirin 325 MG tablet Take 325 mg by mouth daily.      . calcium gluconate 500 MG tablet Take 1 tablet by mouth daily.    . cholecalciferol (VITAMIN D) 1000 UNITS tablet Take 1,000 Units by mouth daily.      . clopidogrel (PLAVIX) 75 MG tablet TAKE 1 TABLET DAILY 90 tablet 0  . clorazepate (TRANXENE) 7.5 MG tablet Take 1 tablet (7.5 mg total) by mouth 3 (three) times daily as needed for anxiety. 90 tablet 5  . clorazepate (TRANXENE) 7.5 MG tablet Take 7.5 mg by mouth as needed.    . hydroxypropyl methylcellulose / hypromellose (ISOPTO TEARS / GONIOVISC) 2.5 % ophthalmic solution Place 1 drop into both eyes as needed for dry eyes.    Marland Kitchen levothyroxine (SYNTHROID) 75 MCG tablet Take 1 tablet (75 mcg total) by mouth daily before breakfast. 90 tablet 3  . metoprolol tartrate (LOPRESSOR) 25 MG tablet Take 1 tablet (25 mg total) by mouth 2 (two) times daily. 180 tablet 3  . pramipexole (MIRAPEX) 0.25 MG tablet TAKE 1 TABLET AT BEDTIME FOR RESTLESS LEG SYMPTOMS 90 tablet 6  . pramipexole (MIRAPEX) 0.25 MG tablet Take 0.25 mg by mouth as needed.    . pravastatin (PRAVACHOL) 40 MG tablet TAKE 1 TABLET DAILY 90 tablet 0  . senna-docusate (SENOKOT-S) 8.6-50 MG per tablet Take 1 tablet by mouth at bedtime.     . traMADol (ULTRAM) 50 MG tablet Take 1 tablet (50 mg total) by mouth 3 (three) times daily as needed for moderate pain. 90 tablet 5   No current facility-administered medications on file prior to visit.    ALLERGIES: Allergies  Allergen Reactions  . Atorvastatin Other (See Comments)    REACTION: INTOL to Lipitor w/ arm pain (3/09)  . Hydrocodone-Acetaminophen Other (See Comments)     REACTION: hallucinations  . Morphine Other (See Comments)    REACTION: hallucinations  . Topamax [Topiramate] Nausea And Vomiting    *dizziness*    FAMILY HISTORY: Family History  Problem Relation Age of Onset  . Heart attack Mother   . Diabetes Mother   . Cancer Father   . Diabetes Sister   . Diabetes Brother   . Cancer Sister     LUNG  . Cancer Brother     Lung    SOCIAL HISTORY: Social History   Social History  . Marital Status: Married    Spouse Name: N/A  . Number of Children: N/A  . Years of Education: N/A   Occupational History  . alterations    Social History Main Topics  .  Smoking status: Current Every Day Smoker -- 0.25 packs/day for 50 years    Types: Cigarettes  . Smokeless tobacco: Never Used     Comment: 4-5 cigs daily  is aware she needs to quit  . Alcohol Use: No  . Drug Use: No  . Sexual Activity: No   Other Topics Concern  . Not on file   Social History Narrative    REVIEW OF SYSTEMS: Constitutional: Fatigue.  No fevers, chills, or sweats,, change in appetite Eyes: No visual changes, double vision, eye pain Ear, nose and throat: No hearing loss, ear pain, nasal congestion, sore throat Cardiovascular: no chest discomfort or palpitations at this time Respiratory:  No shortness of breath at rest or with exertion, wheezes GastrointestinaI: No nausea, vomiting, diarrhea, abdominal pain, fecal incontinence Genitourinary:  No dysuria, urinary retention or frequency Musculoskeletal:  Generalized aches Integumentary: No rash, pruritus, skin lesions Neurological: as above Psychiatric: No depression, insomnia, anxiety Endocrine: No palpitations, fatigue, diaphoresis, mood swings, change in appetite, change in weight, increased thirst Hematologic/Lymphatic:  No anemia, purpura, petechiae. Allergic/Immunologic: no itchy/runny eyes, nasal congestion, recent allergic reactions, rashes  PHYSICAL EXAM: Filed Vitals:   02/22/15 1057  BP: 122/80    Pulse: 79  Temp: 98.2 F (36.8 C)  Resp: 16   General: No acute distress.  Patient appears well-groomed.   Head:  Normocephalic/atraumatic Eyes:  Fundoscopic exam unremarkable without vessel changes, exudates, hemorrhages or papilledema. Neck: supple, no paraspinal tenderness, full range of motion Heart:  Regular rate and rhythm Lungs:  Clear to auscultation bilaterally Back: No paraspinal tenderness Neurological Exam: alert and oriented to person, place, and time. Attention span and concentration intact, recent and remote memory intact, fund of knowledge intact.  Speech fluent and not dysarthric, language intact.  CN II-XII intact. Fundoscopic exam unremarkable without vessel changes, exudates, hemorrhages or papilledema.  Bulk and tone normal, muscle strength 5/5 throughout.  Sensation to light touch, temperature and vibration intact.  Deep tendon reflexes 2+ throughout.  Finger to nose and heel to shin testing intact.  Gait normal.  IMPRESSION: Primary stabbing headache, worse New severe headache.  It sounds like tension-type headache, however she says it is different and severe from her other headaches Cerebrovascular disease with history of TIAs and basilar artery coil.  PLAN: 1.  Since these are new severe headaches in a 74 year old woman with history of ruptured aneurysm, will get MRI and MRA of head 2.  Will also check another Sed Rate 3.  Prescribed prednisone taper 4.  Advised that melatonin can be increased up to 12mg  daily 5.  Try to limit use of tramadol to no more than 2 days out of the week 6.  Continue ASA and Plavix 7.  Follow up in 3 months.  15 minutes spent face to face with patient, over 50% spent discussing diagnosis and management.  Ana Clines, DO  CC:  Teressa Lower, MD

## 2015-02-23 ENCOUNTER — Telehealth: Payer: Self-pay

## 2015-02-23 LAB — SEDIMENTATION RATE: Sed Rate: 13 mm/hr (ref 0–30)

## 2015-02-23 NOTE — Telephone Encounter (Signed)
-----   Message from Pieter Partridge, DO sent at 02/23/2015  7:41 AM EDT ----- The Sed Rate is normal.

## 2015-02-23 NOTE — Telephone Encounter (Signed)
Spoke with pt. Pt. Advised of all results and verbalized understanding. MAH 

## 2015-03-02 ENCOUNTER — Other Ambulatory Visit: Payer: Medicare Other

## 2015-03-02 ENCOUNTER — Inpatient Hospital Stay: Admission: RE | Admit: 2015-03-02 | Payer: Medicare Other | Source: Ambulatory Visit

## 2015-03-10 ENCOUNTER — Other Ambulatory Visit: Payer: Self-pay | Admitting: Pulmonary Disease

## 2015-03-14 ENCOUNTER — Ambulatory Visit
Admission: RE | Admit: 2015-03-14 | Discharge: 2015-03-14 | Disposition: A | Discharge status: Home / Self Care | Payer: Medicare Other | Source: Ambulatory Visit | Attending: Neurology | Admitting: Neurology

## 2015-03-14 DIAGNOSIS — R51 Headache: Secondary | ICD-10-CM | POA: Diagnosis not present

## 2015-03-18 ENCOUNTER — Telehealth: Payer: Self-pay | Admitting: *Deleted

## 2015-03-18 NOTE — Telephone Encounter (Signed)
Notes Recorded by Oda Kilts, CMA on 03/18/2015 at 3:00 PM Patient aware of results , patient has no further questions ------  Notes Recorded by Pieter Partridge, DO on 03/15/2015 at 2:06 PM MRI shows no new strokes or aneurysms. Incidentally, it shows tightening of the spinal canal around the spinal cord in the neck due to arthritis. I think it is an incidental finding and I don't think it is causing the headaches, however.

## 2015-03-19 ENCOUNTER — Other Ambulatory Visit: Payer: Self-pay | Admitting: Pulmonary Disease

## 2015-04-07 ENCOUNTER — Other Ambulatory Visit: Payer: Self-pay | Admitting: Neurology

## 2015-04-07 DIAGNOSIS — G44201 Tension-type headache, unspecified, intractable: Secondary | ICD-10-CM

## 2015-04-07 NOTE — Telephone Encounter (Signed)
Pt requesting refill on prednisone. Unable to reach patient to discuss need. Last had prednisone taper less than 1 month ago. Refill refused at this time.

## 2015-04-22 ENCOUNTER — Encounter: Payer: Self-pay | Admitting: Pulmonary Disease

## 2015-04-22 ENCOUNTER — Other Ambulatory Visit (INDEPENDENT_AMBULATORY_CARE_PROVIDER_SITE_OTHER): Payer: Medicare Other

## 2015-04-22 ENCOUNTER — Ambulatory Visit (INDEPENDENT_AMBULATORY_CARE_PROVIDER_SITE_OTHER): Payer: Medicare Other | Admitting: Pulmonary Disease

## 2015-04-22 VITALS — BP 122/68 | HR 62 | Temp 98.1°F | Wt 126.4 lb

## 2015-04-22 DIAGNOSIS — K581 Irritable bowel syndrome with constipation: Secondary | ICD-10-CM

## 2015-04-22 DIAGNOSIS — M810 Age-related osteoporosis without current pathological fracture: Secondary | ICD-10-CM

## 2015-04-22 DIAGNOSIS — I200 Unstable angina: Secondary | ICD-10-CM | POA: Diagnosis not present

## 2015-04-22 DIAGNOSIS — R51 Headache: Secondary | ICD-10-CM

## 2015-04-22 DIAGNOSIS — E559 Vitamin D deficiency, unspecified: Secondary | ICD-10-CM

## 2015-04-22 DIAGNOSIS — R519 Headache, unspecified: Secondary | ICD-10-CM

## 2015-04-22 DIAGNOSIS — I6350 Cerebral infarction due to unspecified occlusion or stenosis of unspecified cerebral artery: Secondary | ICD-10-CM | POA: Diagnosis not present

## 2015-04-22 DIAGNOSIS — D333 Benign neoplasm of cranial nerves: Secondary | ICD-10-CM

## 2015-04-22 DIAGNOSIS — J4489 Other specified chronic obstructive pulmonary disease: Secondary | ICD-10-CM

## 2015-04-22 DIAGNOSIS — J449 Chronic obstructive pulmonary disease, unspecified: Secondary | ICD-10-CM | POA: Diagnosis not present

## 2015-04-22 DIAGNOSIS — I679 Cerebrovascular disease, unspecified: Secondary | ICD-10-CM

## 2015-04-22 DIAGNOSIS — E039 Hypothyroidism, unspecified: Secondary | ICD-10-CM | POA: Diagnosis not present

## 2015-04-22 DIAGNOSIS — F419 Anxiety disorder, unspecified: Secondary | ICD-10-CM

## 2015-04-22 DIAGNOSIS — F411 Generalized anxiety disorder: Secondary | ICD-10-CM

## 2015-04-22 DIAGNOSIS — Z23 Encounter for immunization: Secondary | ICD-10-CM | POA: Diagnosis not present

## 2015-04-22 DIAGNOSIS — E785 Hyperlipidemia, unspecified: Secondary | ICD-10-CM

## 2015-04-22 DIAGNOSIS — G8929 Other chronic pain: Secondary | ICD-10-CM

## 2015-04-22 LAB — CBC WITH DIFFERENTIAL/PLATELET
Basophils Absolute: 0.1 10*3/uL (ref 0.0–0.1)
Basophils Relative: 0.6 % (ref 0.0–3.0)
EOS PCT: 4.2 % (ref 0.0–5.0)
Eosinophils Absolute: 0.4 10*3/uL (ref 0.0–0.7)
HCT: 40.3 % (ref 36.0–46.0)
HEMOGLOBIN: 13.3 g/dL (ref 12.0–15.0)
LYMPHS ABS: 2.4 10*3/uL (ref 0.7–4.0)
Lymphocytes Relative: 26.5 % (ref 12.0–46.0)
MCHC: 33 g/dL (ref 30.0–36.0)
MCV: 87.2 fl (ref 78.0–100.0)
MONOS PCT: 6.6 % (ref 3.0–12.0)
Monocytes Absolute: 0.6 10*3/uL (ref 0.1–1.0)
Neutro Abs: 5.6 10*3/uL (ref 1.4–7.7)
Neutrophils Relative %: 62.1 % (ref 43.0–77.0)
Platelets: 338 10*3/uL (ref 150.0–400.0)
RBC: 4.63 Mil/uL (ref 3.87–5.11)
RDW: 13.6 % (ref 11.5–15.5)
WBC: 9.1 10*3/uL (ref 4.0–10.5)

## 2015-04-22 LAB — BASIC METABOLIC PANEL
BUN: 15 mg/dL (ref 6–23)
CALCIUM: 9.7 mg/dL (ref 8.4–10.5)
CO2: 26 mEq/L (ref 19–32)
Chloride: 108 mEq/L (ref 96–112)
Creatinine, Ser: 0.73 mg/dL (ref 0.40–1.20)
GFR: 82.76 mL/min (ref >60.00)
GLUCOSE: 89 mg/dL (ref 70–99)
Potassium: 4.2 mEq/L (ref 3.5–5.1)
SODIUM: 142 meq/L (ref 135–145)

## 2015-04-22 LAB — TSH: TSH: 0.22 u[IU]/mL — ABNORMAL LOW (ref 0.35–4.50)

## 2015-04-22 LAB — LIPID PANEL
CHOLESTEROL: 140 mg/dL (ref 0–200)
HDL: 42.7 mg/dL (ref >39.00)
LDL CALC: 75 mg/dL (ref 0–99)
NonHDL: 97.73
Total CHOL/HDL Ratio: 3
Triglycerides: 112 mg/dL (ref 0.0–149.0)
VLDL: 22.4 mg/dL (ref 0.0–40.0)

## 2015-04-22 LAB — HEPATIC FUNCTION PANEL
ALBUMIN: 4.1 g/dL (ref 3.5–5.2)
ALK PHOS: 111 U/L (ref 39–117)
ALT: 9 U/L (ref 0–35)
AST: 14 U/L (ref 0–37)
Bilirubin, Direct: 0.1 mg/dL (ref 0.0–0.3)
Total Bilirubin: 0.4 mg/dL (ref 0.2–1.2)
Total Protein: 7.3 g/dL (ref 6.0–8.3)

## 2015-04-22 LAB — VITAMIN D 25 HYDROXY (VIT D DEFICIENCY, FRACTURES): VITD: 48 ng/mL (ref 30.00–100.00)

## 2015-04-22 NOTE — Progress Notes (Signed)
Subjective:    Patient ID: Ana Santos, female    DOB: Jan 18, 1941, 74 y.o.   MRN: [id number]  HPI 74 y/o WF here for a follow up visit... she has multiple medical problems as reviewed below...   ~  SEE PREV EPIC NOTES FOR EARLIER DATA >>   LABS 6/13:  FLP- at goals on Prav40;  Chems- wnl;  CBC- wnl;  TSH=0.71;  VitD=53...  CXR 7/14> she forgot to go to the [place] for this film...   LABS 7/14:  FLP- looks ok on Prav40;  Chems- wnl;  CBC- wnl;  TSH=2.47;  VitD=67... ADDENDUM> DrMorris ([facility], Interventional radiology) insists that we obtain her f/u CTAngiogram & send it to him, we will also obtain f/u MRI Brain to assess her acoustic neuroma...  CTA Head 9/14 showed s/p stent assisted coiling of basilar tip aneurysm, no aneurysm recurrence, patent basilar art & left post cerebral art, mild atherosclerotic calcif in cavernous carotids w/o stenosis, post op right mastoid changes w/o recurrent tumor  MRI Brain 9/14 showed no evid of resid or recurrent vestib schwannoma on the right, post surg changes w/ scarring in the int auditory canal, ols sm vessel cerebellar infarctions are stable, mild atrophy & sm vessel dis, prev coiled basilar tip aneurysm, no acute insults... ADDENDUM> Letter from DrMorris 9/14 after review of the CTA indicates that everything is stable and flow is satisfactory (he is leaving [place] can assist in the future if needed).  CXR 1/15 showed norm heart size, COPD/Emphysema w/ scarring in lingula, NAD.Marland KitchenMarland Kitchen  EKG 1/15 showed NSR, rate60, poor R prog V1-3, NSSTTWA, NAD...  2DEcho 2/15 showed normal LV size & function w/ EF=55-60%, no regional wall motion abnormalities, Gr1DD, norm valves and RV...  Holter Monitor 2/15 showed NSR, PACs, PVCs, and no pathologic arrhythmias (the skips correspond to her subjective SOB/palpit)...   August 12, 2013:  add-on visit post hospital check>  Ana Santos developed diplopia & blurry vision and was adm to [facility] 2/15 - 07/30/13>  review of records indicated that her neuro exam was otherw WNL, MRI Brain showed a right occipital lobe infarct, and MRA showed no new abnormality- no recurrent aneurysm, patent sent & good flow in the PCA; symptom resolved spont & they continued her same meds [id number] & [id number]); she has been able to cut down to 1cig/d and again we reviewed the critical need to quit smoking completely... CT Brain 07/27/13 [facility] showed prior basilar tip aneurysm repair, post surg changes in the right mastoid region, NAD...  MRI Brain 2/15 showed sm areas of acute infarct in right occipital lobe (new from 9/14 MRI); chr infarcts in the cerebellum bilat, basilar art stent & coiling of basilar tip aneurysm...  MRA Brain 2/15 showed no evid for recurrent basilar tip aneurysm, mild to mod narrowing of the right P1 segment, left P1 segment stented...  CDopplers 2/15 showed some plaque in the bulbs and ICAs bilat, <50% stenoses bilat noted...   EKG 2/15 showed NSR, rate63, poor R prog V1-3, NAD...  LABS 2/15 at [facility] showed> Chems- wnl w/ Cr=1.0;  CBC- wnl w/ Hg=13.5;  Abn UA w/ UTI evident...   ~  Oct 15, 2013:  69mo ROV & post ER visit> Ana Santos went to the ER 08/26/13 with another TIA characterized by diplopia & ataxia that last 11min or so; assoc mild HA, no focal neuro findings in ER; already on ASA325/ Plavix75 w/ good compliance; SEE PREV EVAL 2/15... They did CTA via  ER (results below- NAD) and she was disch on same meds... She had f/u Neuro eval at [facility] 08/20/13 (prior to this ER visit)> DrGomadam NOTE REVIEWED, they ordered CTA (subseq done via [facility]), & and Echo w/ bubble study... She denies any additional cerebral ischemic symptoms since the 3/15 ER visit; unfortunately still smoking; review of chart shows NO documented AFib in her past... She has a f/u Neuro appt in [place] in June...    She states breathing is good, still smoking 3-4cig/d, denies cough/ sput/ hemoptysis/ SOB; BP is 140/70 & she denies CP,  palpit, dizzy, edema...     She remains on Pravastatin40 for her Chol; FLP 5/15 showed TChol 164, TG 120, HDL 49, LDL 91...    She is clinically stable on her Levothyroid62mcg/d but last 5/15 showed TSH= 0.25 & she is rec to decr dose to 81mcg/d (we will f/u TSH on return)... We reviewed prob list, meds, xrays and labs> see below for updates >>   EKG 3/15 showed NSR, rate67, poor R prog V1-3, otherw wnl...  CT Head 3/15 showed evid prior vertebral art aneurysm surg & post-op changes in right mastoid, NAD...  CTA Head & Neck 3/15 showed prev basilar tip aneurysm coiling & basilar stent, prior right mastoidectomy, vessels are patent & no recurrent aneurysm; neck showed apical scarring & emphysema, 40% left subclav stenosis, 40-50% bilat ICA stenoses, mild stenosis at the origin of the vertebral as well...  LABS 5/15:  FLP- at goals on Prav40;  Chems & LFTs- wnl;  TSH= 0.25 on Levothy75 & rec to decr to 76mcg/d...   ~  April 17, 2014:  38mo ROV & Ana Santos is c/o HA- sharp pains in her temples, assoc w/ occas weak spells ("don't feel like getting OOB"), poor appetite, and occas falling (no known injury); she remains on ASA325 & Plavix75> she voiced discontent w/ Neuro eval at [facility] states she never received call from her doctor (Neuro- DrHusseini);  I reviewed Care Everywhere records- summary & last note 03/11/14 from DrHusseini> extensive note reviewed, pt stable on ASA/ Plavix and no changes made, she was asked to f/u prn;  Ana Santos's BP, Chol, BS- all well controlled but she is still smoking and can't vs won't quit;  She requests 2nd opinion from local [facility] due to her temporal HAs, falling, weak spells...  We reviewed the following medical problems during today's office visit >>     COPD> still smoking 1/2ppd & can't vs won't quit; not on meds, min cough/ sputum, denies hemoptysis/ SOB/ ch in DOE, etc; CXR 1/15= COPD, NAD; she MUST quit all smoking!    Palpit> new complaint 1/15> w/u in progress &  asked to STOP SMOKING & avoid caffeine etc; Care Everywhere shows 30d Holter w/ PACs/ PVCs/ no AFib...    Cerebrovasc dis/ Aneurysm> on ASA325, Plavix75; she sees DrMorris IR at [facility] 8/12 w/ CTA that looked good- no resid aneurysm, distal flow appeared good & he didn't rec any changes; he requested that we do her f/u CTA & MRI Brain in [place] 9/14=> good flow thru the stented basilar art, no recurrent aneurysm, no recurrent acoustic neuroma;  Then she was [facility] 2/15 w/ TIA & MRI/MRA Brain showed acute infarct in right occipital lobe, otherw stable;  Subsequent f/u by [facility] Neurology- no change in meds, rec quit smoking and control of BP, BS, Chol...    Hyperlipid> on Prav40; tol well & FLP 11/15 shows TChol 168, TG 129, HDL 43, LDL  99; continue same + diet...    Hypothyroid> on Levothy50 now; labs 11/15 shows TSH= 9.91 and she is rec to increase Synthroid back to 48mcg/d...    DJD/ FM> she is managing quite well w/ prn Tramadol & Tylenol; prev CWP resolved...    Osteopenia> she had min osteopenia on BMD several yrs ago & takes Calcium, MVI, Vit D 1000u daily; f/u BMD 2/15 showed TScores -0.6 in Spine, and -1.6 in right FemNeck; rec to stay on supplements & wt bearing exercise...    RLS> on Mirapex 0.25mg  Qhs as needed...     Acoustic Neuroma> this was resected by DrBrowne [facility] 2002; MRI 2/11 continued to look good- post op changes, no recurrent tumor seen; f/u MRI here 9/14 (& [facility] 2/15) continues to look good- no evid for recurrent or resid tumor...    Anxiety> on Tranxene as needed... We reviewed prob list, meds, xrays and labs> see below for updates >> OK Flu shot today...  LABS 11/15:  FLP- at goals on Prav40;  Chems- wnl;  CBC- wnl;  TSH=9.91;  Sed=36... PLAN>>  She desperately need to quit smoking, declines smoking cessation help;  TSH is elev on Synthroid50 & she confirms daily dosing, therefore incr back to Synthroid75;  We will refer to [facility] for 2nd opinion regarding  her cerebrovasc dis, HAs, falling, weak spells...   ~  August 03, 2014:  41mo ROV & acute add-on visit requested by Neuro-DrJaffe due to CP & irreg heartbeat> Ellory was at [facility] office today (for f/u of headaches- improved w/ Neurontin but intol she said due to incr palpit) & mentioned left CP & palpit, he noted skips on exam & called for a work-in appt here;  She has a cardiac hx- followed for yrs by DrStuckey w/ cath 1995 showing min nonobstructive CAD & mild MVP; she developed palpit in 2015 w/ abn EKG (poor R prog, NSTTWA)) and 2DEcho showing norm LVF, no regional wall motion abn, Gr1DD, normal valves & RV; Holter monitor revealed NSR w/ some PACs & PVCs but no dangerous arrhythmias- she was asked to avoid caffeine, pseudophed, and QUIT SMOKING (she still smokes ~1/2ppd)... As noted she has severe cerebrovasc dis & hx Acoustic Neuroma w/ prev gamma knife surg at [facility] in 2002; a 30d Event monitor at [facility] was said to reveal PACs, PVCs, but no AFib... Current symptoms sound similar to prev w/ skipping sensation, no racing, sl SOB, mild chest discomfort on & off daily x 3-4wks (sharp, not sore to touch, sl worse w/ movement); she does not exercise & her most strenuous activity is housework...    We reviewed prob list, meds, xrays and labs> see below for updates >>   CXR 2/16 showed norm heart size, linear atx vs scarring left base, otherw neg- NAD.Marland KitchenMarland Kitchen  EKG today showed NSR, rate80, occ PVCs noted, poor R prog V1=>3, NSSTTWA... PLAN>>  We decided to start METOPROLOL 25mg - 1/2 tab Bid for now & refer to Cards for f/u eval; reminded about smoking cessation but she declines additional counseling; her husb sees DrMcLean...  ~  Oct 20, 2014:  24mo ROV & Valori reports improved after cardiac eval by DrArida (see below)- neg cath, neg 2DEcho, palpit improved w/ Metoprolol & decr caffeine etc; unfortunately she continues to smoke & will not quit, refuses smoking cessation meds etc... She is in the middle of  bilat cat surg by DrBevis... No new complaints or concerns- denies cough, sput, hemoptysis, SOB, CP, f/c/s, etc; she  states her main exercise is yard work... We reviewed prob list, meds, xrays and labs> see below for updates >>  PLAN>> she reports improved on current med regimen; we again discussed the need for smoking cessation, take meds regularly; she will f/u in 87mo w/ Fasting labs & call in the interim prn...  ~  April 22, 2015:  64mo ROV & Ana Santos has been having some increased HAs- went to see DrJaffe in Neuro w/ f/u MRI/MRA (no acute changes) and treated transiently w/ Pred taper & improved; she has Tramadol/ Tylenol to use as needed;  We reviewed the following medical problems during today's office visit >>     COPD> still smoking 5-6cig/d she says & can't vs won't quit; not on meds, denies cough/ sputum/ hemoptysis/ SOB/ ch in DOE, etc; CXR 2/16= COPD, mild basilar atx, NAD; Spirometry shows mild obstructive dis- must quit all smoking!     Palpit> this was a new complaint 1/15> w/u DrArida was neg (2DEcho, Cath) & symptoms improved w/ Metoprolol- asked to STOP SMOKING & avoid caffeine etc; Care Everywhere shows 30d Holter w/ PACs/ PVCs/ no AFib...    Cerebrovasc dis/ Aneurysm> on ASA325, Plavix75; she sees DrMorris IR at [facility] 8/12 w/ CTA that looked good- no resid aneurysm, distal flow appeared good & he didn't rec any changes; he requested that we do her f/u CTA & MRI Brain in [place] 9/14=> good flow thru the stented basilar art, no recurrent aneurysm, no recurrent acoustic neuroma;  Then she was [facility] 2/15 w/ TIA & MRI/MRA Brain showed acute infarct in right occipital lobe, otherw stable;  Subsequent f/u by [facility] Neurology- no change in meds, rec quit smoking and control of BP, BS, Chol...    Hyperlipid> on Prav40; tol well & FLP 11/16 shows TChol 140, TG 112, HDL 43, LDL 75; continue same + diet...    Hypothyroid> on Levothy75 now; labs 11/15 shows TSH= 9.91 and she was rec to  increase Synthroid back to 83mcg/d; Labs 11/16 showed TSH=0.22, continue same for now...    DJD/ FM> she is managing quite well w/ prn Tramadol & Tylenol; prev CWP resolved...    Osteopenia> she had min osteopenia on BMD several yrs ago & takes Calcium, MVI, Vit D 1000u daily; f/u BMD 2/15 showed TScores -0.6 in Spine, and -1.6 in right FemNeck; rec to stay on supplements & wt bearing exercise...    RLS> on Mirapex 0.25mg  Qhs as needed...     Acoustic Neuroma> resected by DrBrowne [facility] 2002; MRI 2/11 continued to look good- post op changes, no recurrent tumor seen; f/u MRI here 9/14 (& [facility] 2/15) continues to look good- no evid for recurrent or resid tumor.    Headaches> s/p eval by Neuro, DrJaffe- 9/16 note reviewed, MRI w/o acute changes, Cspine spondy noted w/ some spinal stenosis, treated w/ Pred taper & improved...    Anxiety> on Tranxene as needed... We reviewed prob list, meds, xrays and labs> OK 2016 FLU vaccine today...  MRI/MRA 02/22/15>  No acute infarct, tiny cbll blood breakdown products unchanged w/o new areas of intracranial hemorrhage detected, remote right cbll infarcts & sm vessel dis, postop right vestib schwannoma surg- no acute changes, cerv spondylosis w/ sp stenosis C3-4 & C4-5, s/p coiling basilar tip aneurysm, mild irregularity of basilar art, mod narrowing of P1 segm left p[ost cerebral art (mild narrowing on right)...   Spirometry 04/22/15>  FVC=2.46 (97%), FEV1=1.69 (88%), %1sec=69%, mid-flows reduced at 64% predicted;  This is c/w mild airflow obstruction and GOLD Stage 1 COPD  LABS 04/22/15>  FLP- at goals on Prav40;  Chems- wnl;  CBC- wnl;  TSH= 0.22 on Synth75;  VitD= 48 on 1000u/d... IMP/PLAN>>  Ana Santos is stable overall w/ her mult serious medical issues;  We again discussed smoking cessation;  She continues to do alterations as she has done for 50+yrs;  We refilled her Tramadol & Tranxene...          Problem List:   COPD (ICD-496) - she has a min smoker's  cough, sm amt beige sputum... ~  CXR 5/08 w/ chr changes, LLL scar, NAD. ~  CXR 7/09 showed norm hrt size, clear lungs, NAD. ~  CXR 9/10 showed NAD. ~  CXR 10/11 showed chr changes, NAD. ~  CXR 12/12 showed COPD, NAD. ~  CXR 4/13 showed normal heart size, incr interstitial markings, NAD. ~  CXR 1/15 showed norm heart size, COPD/Emphysema w/ scarring in lingula, NAD. ~  CXR 2/16 showed norm heart size, linear atx vs scarring left base, otherw neg- NAD  CIGARETTE SMOKER (ICD-305.1) - can't vs won't quit and not interested in smoking cessation help... now she states she's decr to ~1/4-1/2 ppd but can't seem to improve from there... ~  She understands the critical need to quit smoking from the COPD/Pulm, Cardiac, & Vascular/Stroke standpoints;  She declines offers for smoking cessation help, chantix, nicotine replacement, etc... ~  2/15:  She was hosp at [facility] w/ Diplopia & found to have a right occipital stroke=> she has decr smoking to <1cig/d & encouraged to quit completely!!! ~  5/15 to 11/15:  She is still smoking 3-4cig/d she says & I have begged her to quit, discussed nicotine replacement rx & alternatives... ~  2/16: she is still smoking <1/2ppd she says but again declines smoking cessation help...  ?MITRAL VALVE PROLAPSE (ICD-424.0) >> mild MVP seen on cath 1995 by DrStuckey... Hx of CHEST PAIN (ICD-786.50) >> she had CP in the past and a neg cath 1995 (min coronary irregularities)...  Hx PALPITATIONS >> PVCs and PACs seen on EKG & Holter monitor in 2015; asked to stop all caffeine & nicotine... ~  CP eval 1995 w/ cath showing min 10% luminal irreg RCA & mild MVP, norm LVF... ~  [place] was neg- no ischemia, no infarct, EF=66%... ~  baseline EKG showed NSR, NSSTTWA (poor R prog V1-V3)... ~  EKG 1/15 showed NSR, rate60, poor R prog V1-3, NSSTTWA, NAD... ~  2DEcho 2/15 showed normal LV size & function w/ EF=55-60%, no regional wall motion abnormalities, Gr1DD, norm valves  and RV ~  Holter Monitor 2/15 showed NSR, PACs, PVCs, and no pathologic arrhythmias (the skips correspond to her subjective SOB/palpit)...  ~  She had another 30d Holter per [facility] Neurology w/ Care Everywhere report indicating PACs & PVCs, no AFib... ~  EKG 2/16 showed NSR, rate80, PVCs noted, poor R prog V1=>3, NSSTTWA... She noted incr palpit & CP => referred to Cards for their review... ~  Seen by DrArida 2/16> Cath showed minor luminal irregularities w/o obstructive CAD, norm LVF w/ EF=55%, no signif MR; symptoms felt to be due to PVCs & Rx w/ Metoprolol improved her symptoms...  ~  2DEcho 3/16 showed norm LV size & function w/ EF=65-70%, no regional wall motion abn, Gr1DD, norm valves, norm RV & PAsys...  CEREBROVASCULAR DISEASE (ICD-437.9), & ANEURYSM (ICD-442.9) - found to have a 4-81mm basilar tip aneurysm on MRI Br 10/10... referred to  DrDeveshwar w/ Angiogram confirmation & subseq stent assisted coiling of the aneurysm 11/10... subseq f/u angiogram 2/11 showed complete angiographic obliteration of the aneurysm, & patent stent in distal basilar art (there was a new 50%stenosis in the right PCA)... on ASA 325mg /d & PLAVIX restarted 2/11 w/ new gait abn & cbll infarct on MRI. ~  she saw DrMorris [facility]- interventional neuroradiology- w/ another arteriogram performed 8/11- it showed satis appearance of the basilar aneurysm stent & coiling w/ some prolapse of the coils into the P1 segm w/ mild compromise of the post cerebral art (flow looks adeq & he rec continued ASA/ Plavix therapy & f/u 75yr). ~  She had f/u DrMorris 8/12 w/ CTA that looked good- no resid aneurysm, distal flow appeared good & he didn't rec any changes- f/u planned 79yrs. ~  7/14: she is due for her f/u CTA & we will call [facility] regarding a follow up appt w/ DrMorris.. ~  9/14:  on [id number], Plavix75; last f/u DrMorris IR at [facility] 8/12 w/ CTA that looked good- no resid aneurysm, distal flow appeared good & he didn't rec any changes; he requested  that we do her f/u CTA & MRI Brain here in [place] 9/14=> good flow thru the stented basilar art, no recurrent aneurysm, no recurrent acoustic neuroma...  ~  CTA Head 9/14 showed s/p stent assisted coiling of basilar tip aneurysm, no aneurysm recurrence, patent basilar art & left post cerebral art, mild atherosclerotic calcif in cavernous carotids w/o stenosis, post op right mastoid changes w/o recurrent tumor. ~  2/15: Adm to [facility] w/ Dip[lopia & eval revealed right occipital lobe infarct, everything else was stable, continued on her ASA325/ Plavix75=> we will refer to [facility] Neurology for their review of her situation... ~  3/15: she had a busy March> f/u w/ Neuro in [place] then had another TIA & presented to the ER; CTA Head & Neck showed similar to prev, no recurrent aneurysm, & they disch her on same ASA325 + Plavix75... ~  9/15: she had f/u DrHusseini at [facility], note reviewed on Care Everywhere- felt to be stable & no changes made> rec to quit smoking & control BP, BS, Chol; they released her to f/u prn... ~  11/15: pt upset that Neuro didn't address her c/o temporal HAs, falling, weak spells- we will refer to [place] [facility] per her request...  ~  2/16: pt seen by DrJaffe> Hx basilar tip aneurysm s/p coiling 2010, right cbll infarct, right vestib schwannoma s/p surg 2002, recurrent TIA, & HAs; they reied Gabapentin=> HA improved but she developed CP & thought it was from this med therefore stopped it; she was advised to try MelatoninQhs & limit her Tramadol rx, f/u 52mo...  VENOUS INSUFFICIENCY (ICD-459.81) - she tells me that she saw DrKrush for vein surgery and treatment ("it's covered by my husb's insurance").  HYPERLIPIDEMIA (ICD-272.4) - now on PRAVASTATIN 40mg /d & tol well... we reviewed low chol/ low fat diet. ~  chart review shows TChol ~ 200 range in the 80's and early 90's. ~  then TChol incr to ~250 range in late 90's and Lipitor started-  ~  FLP 5/08 on Lipitor 10mg /d showed TChol 164, TG  153, HDL 45, LDL 89. ~  in 2009 c/o no energy & arm discomfort she felt was from the Lip10- therefore [place]. ~  [place] 7/09 on diet alone showed TChol 234, TG 218, HDL 40, LDL 139... rec- start Prav40. ~  FLP 3/10 on Prav40 showed TChol 182,  Tg 137, HDL 47, LDL 107 ~  FLP 6/11 on Prav40 showed TChol 162, TG 230, HDL 37, LDL 88 ~  FLP 6/12 on Prav40 showed TChol 177, TG 139, HDL 44, LDL 105... Contin diet, take med every day. ~  FLP 6/13 on Prav40 showed TChol 151, TG 138, HDL 42, LDL 81  ~  FLP 7/14 on Prav40 showed TChol 174, TG 68, HDL 58, LDL 103 ~  FLP 5/15 on Prav40 showed TChol 164, TG 120, HDL 49, LDL 91 ~  FLP 11/15 on Prav40 showed TChol 168, TG 129, HDL 43, LDL 99   HYPOTHYROIDISM (ICD-244.9) - stable on LEVOTHYROID 38mcg/d now... hx Hashimoto's thyroiditis in 1989 w/ markedly pos antimicrosomal antibodies and transient hyperthy labs...  ~  labs 5/08 showed TSH= 0.53... rec- continue Levo100/d. ~  labs 7/09 showed TSH= 0.07... rec- decr Levothy100 to 1/2 daily. ~  labs 3/10 showed TSH= 17.71... rec> incr back to 115mcg/d. ~  labs 9/10 showed TSH= 0.21... continue same. ~  labs 6/11 showed TSH= 0.10... rec decr Levo100 to 1/2 daily. ~  labs 10/11 on Levo50 showed TSH= 1.25 (FreeT3 & FreeT4 normal) ~  Labs 6/12 on Levo50 showed TSH= 12.56 ==> we will contact pt & pharm to determine if taking Levo100-1/2tab daily every day? & adjust. ~  Labs 12/12 on Levo75 showed TSH= 0.88... Continue same. ~  Labs 6/13 on Levo75 showed TSH= 0.71... Continue same. ~  Labs 7/14 on Levo75 showed TSH= 2.47 ~  Labs 5/15 on Levo75 showed TSH= 0.25 and we rec decr to Levothy50... ~  Labs 11/15 on Levo50 showed TSH= 9.91 and she is rec to go back on Synthroid75  IRRITABLE BOWEL SYNDROME (ICD-564.1) - had colonoscopy by [facility] 11/06 that was WNL.Marland Kitchen. she notes some constipation & Rx w/ colase/ senakot-S OTC...  NEPHROLITHIASIS (ICD-592.0) - last stone passed in 1993, no known recurrence since then...  Hx  of TENDINITIS, RIGHT ELBOW (ICD-727.09) Hx right shoulder pain w/ eval by DrNorris (MRI was planned butnever done & symptoms improved spont)... FIBROMYALGIA (ICD-729.1) - prev severe symptoms may have reflected a flair in her FM> but much improved on Pred Rx. ~  labs 9/10 showed Sed= 24 ~  labs 6/11 showed Sed= 30 ~  labs 10/11 showed CBC=norm, Chems=norm, TFTs=norm, Sed=21, RF=neg, ANA+1:40 speckled... ~  12/11:  pt saw DrTruslow & his note is pending- she indicates he stopped her Pred, & gave her shot in shoulder.  OSTEOPENIA (ICD-733.90) - BMD here 7/09 showed TScores -0.8 in Spine, and -1.2 in right FemNeck... rec to take Calcium, MVI, Vit D... ~  BMD repeated 2/15 & showed TScores -0.6 in Spine, and -1.6 in right FemNeck; rec to stay on Calcium, MVI, VitD, & wt bearing exercise...  VITAMIN D DEFICIENCY (ICD-268.9) ~  labs 7/09 showed Vit D level = 16... rec- start Vit D 50K weekly. ~  labs 3/10 showed Vit D level = 69... rec> change to 1000 u daily. ~  labs 6/11 showed Vit D level = 55... Continue same. ~  Labs 6/13 showed Vit D level = 53... Continue same. ~  Labs 7/14 showed Vit D level = 67  Hx of ACOUSTIC NEUROMA (ICD-225.1) - s/p surg for this tumor 10/02 at [facility] DrBrowne... no known recurrence... ~  MRI Brain 2/07 showed s/p translabyrinthine approach to right acoustic neuroma w/o recurrence, NAD... ~  MRI Brain 10/10 showed no recurrence of tumor, but 4-26mm basilar tip aneurysm detected & Rx as above. ~  MRI Brain 2/11 showed no recurrence/ post op changes, endovasc oblit of basilar aneurysm, cerebellar lacunar infarct & 50% stenosis of right PCA... PLAVIX restarted... she had review by [facility] IR & Neurology (as above)... ~  We discussed f/u MRI since DrBrowne hasn't seen her since 2011 (?he turned her over to DrMorris)... ~  9/14:  MRI Brain showed no evid of resid or recurrent vestib schwannoma on the right, post surg changes w/ scarring in the int auditory canal, ols sm vessel  cerebellar infarctions are stable, mild atrophy & sm vessel dis, prev coiled basilar tip aneurysm, no acute insults...  ~  MRI/ MRA Brain 2/15 at [facility] showed acute infarct in right occipital lobe, chronic infarcts in cerebellum bilat, aneurysm coiling of basilar art tip and stent in basilar art.. ~  CT Angio Head & Neck 3/15 in [place] showed 40% narrowing of prox left subclavian art; 40% RICAstenosis & [id number] LICAstenosis; aneurysm coiling & stenting of basilar tip aneurysm...  RLS symptoms >> she complained 1/14 about new onset RLS symptoms- discussed trial Mirapex 0.25mg  prn...  ANXIETY (ICD-300.00) - uses CHLORAZEPATE 7.5mg Tid... she gets claustrophobic and needs sedation before MRIs.   Past Surgical History  Procedure Laterality Date  . Translabyrinthine procedure  2002    [facility] Dr. Vicie Mutters  . Total abdominal hysterectomy    . Breast lumpectomy    . Aneurysm coiling  11-10    Dr. Estanislado Pandy  . Left heart catheterization with coronary angiogram N/A 08/05/2014    Procedure: LEFT HEART CATHETERIZATION WITH CORONARY ANGIOGRAM;  Surgeon: Wellington Hampshire, MD;  Location: [facility] CATH [facility];  Service: Cardiovascular;  Laterality: N/A;    Outpatient Encounter Prescriptions as of 04/22/2015  Medication Sig  . acetaminophen (TYLENOL) 500 MG tablet Take 500 mg by mouth every 6 (six) hours as needed for mild pain.   Marland Kitchen aspirin 325 MG tablet Take 325 mg by mouth daily.    . calcium gluconate 500 MG tablet Take 1 tablet by mouth daily.  . cholecalciferol (VITAMIN D) 1000 UNITS tablet Take 1,000 Units by mouth daily.    . clopidogrel (PLAVIX) 75 MG tablet TAKE 1 TABLET DAILY  . clorazepate (TRANXENE) 7.5 MG tablet Take 1 tablet (7.5 mg total) by mouth 3 (three) times daily as needed for anxiety.  . hydroxypropyl methylcellulose / hypromellose (ISOPTO TEARS / GONIOVISC) 2.5 % ophthalmic solution Place 1 drop into both eyes as needed for dry eyes.  Marland Kitchen levothyroxine (SYNTHROID, LEVOTHROID) 75 MCG tablet TAKE  1 TABLET DAILY BEFORE BREAKFAST  . metoprolol tartrate (LOPRESSOR) 25 MG tablet Take 1 tablet (25 mg total) by mouth 2 (two) times daily.  . pramipexole (MIRAPEX) 0.25 MG tablet TAKE 1 TABLET AT BEDTIME FOR RESTLESS LEG SYMPTOMS  . pravastatin (PRAVACHOL) 40 MG tablet TAKE 1 TABLET DAILY  . senna-docusate (SENOKOT-S) 8.6-50 MG per tablet Take 1 tablet by mouth at bedtime.   . traMADol (ULTRAM) 50 MG tablet Take 1 tablet (50 mg total) by mouth 3 (three) times daily as needed for moderate pain.  . [DISCONTINUED] predniSONE (DELTASONE) 10 MG tablet Take 6tabs x1day, then 5tabs x1day, then 4tabs x1day, then 3tabs x1day, then 2tabs x1day, then 1tab x1day, then STOP    Allergies  Allergen Reactions  . Atorvastatin Other (See Comments)    REACTION: INTOL to Lipitor w/ arm pain (3/09)  . Hydrocodone-Acetaminophen Other (See Comments)    REACTION: hallucinations  . Morphine Other (See Comments)    REACTION: hallucinations  . Topamax [Topiramate] Nausea And  Vomiting    *dizziness*    Current Medications, Allergies, Past Medical History, Past Surgical History, Family History, and Social History were reviewed in [employer] record.   Review of Systems         See HPI - all other systems neg except as noted... The patient complains of dyspnea on exertion, recent palpitations & HAs.  The patient denies anorexia, fever, weight loss, weight gain, vision loss, decreased hearing, hoarseness, chest pain, syncope, peripheral edema, prolonged cough, hemoptysis, abdominal pain, melena, hematochezia, severe indigestion/heartburn, hematuria, incontinence, muscle weakness, suspicious skin lesions, transient blindness, difficulty walking, depression, unusual weight change, abnormal bleeding, enlarged lymph nodes, and angioedema.     Objective:   Physical Exam     WD, WN, 74 y/o WF in NAD... GENERAL:  Alert & oriented; pleasant & cooperative... HEENT:  [place]/AT, EOM-wnl, PERRLA,  EACs-clear, TMs- some scarring on right, NOSE-clear, THROAT-clear & wnl. NECK:  Supple w/ fairROM; no JVD; normal carotid impulses w/o bruits; palp thyroid, no nodules felt; no lymphadenopathy. CHEST:  Clear to P & A; without wheezes/ rales/ or rhonchi heard... HEART:  Regular Rhythm; gr1/6 SEM,  no irregularity, rubs or gallops heard... ABDOMEN:  Soft & nontender; normal bowel sounds; no organomegaly or masses detected. EXT: without deformities, mild arthritic changes; no varicose veins/ venous insuffic/ or edema... +trigger points about the neck/ shoulders w/ decr ROM right shoulder w/ pain... NEURO:  CN's intact; no focal neuro deficits... gait & station are normal. DERM:  No lesions noted; no rash etc...  RADIOLOGY DATA:  Reviewed in the EPIC EMR & discussed w/ the patient...  LABORATORY DATA:  Reviewed in the EPIC EMR & discussed w/ the patient...   Assessment & Plan:    COPD/ Smoker>  She notes min cough/ sputum/ etc; desperately needs to quit smokng- offered counseling, medications, etc but she declines...  Hx mild MVP>  No MVP seen on 2DEcho 2/15, mild MVP was noted on cath 1995 by DrStuckey...  PALPITATIONS>> Prev eval w/ CXR, EKG, 2DEcho & Holter monitor=> all studies OK/NEG as above (x PACs/ PVCs); we stressed the importance of no nicotine, no caffeine, etc... 2/16> she presented w/ incr symptoms over the last month; rec to start Metop25- 1/2Bid & refer to Cards for their review... 2/16> seen by DrArida w/ Cath, repeat 2DEcho- both neg & palpit/ PVCs improved w/ Metoprolol rx...  Hx right sided Acoustic Neuroma- s/p surg 2002 [facility]> no known recurrence to date including MRI 9/14...  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 Another TIA 3/15>  eval via ER w/ CTA Head & Neck- stable, no changes made to meds- rec to continue ASA/ Plavix, AND QUIT SMOKING.... She was released by [facility]- DrHusseini & asked to quit smoking,  continue ASA/Plavix, keep BP/ BS/ Chol under control... 11/15> pt c/o HAs, falls, weakness & requests Neuro 2nd opinion at [facility]... 2/16> seen by [facility] for HAs & neuro 2nd opinion...  HYPERLIPID>  On Prav40 + diet;  FLP looks OK but needs better diet & take med Qhs...  HYPOTHYROID>  Back on Levothy 48mcg/d now w/ TSH= 9.91 last OV on 24mcg/d...  IBS w/ Constip>  She uses OTC laxatives as needed & we discussed Miralax/ Senakot-S/ etc...  ORTHO>  Prev right shoulder discomfort improved spont & MRI was never done; currently uses Tramadol Prn...  OSTEOPENIA/ Vit D defic>  Stable on Calcium, MVI, Vit D supplement; due for f/u BMD=> pending...  RLS symptoms>  trial Mirapex 0.25mg  hs prn...  Hx Chronic daily HAs>>  HAs improved spontaneously- on Tramadol & Tylenol w/ some relief of pain; she tried Topamax but intol w/ dizzy & nausea... 11/15> presented w/ incr HAs and referred to Neuro; they tried Neurontin w/ decr HAs but intol she says...  ANXIETY>  Uses Tranxene Prn (she has some claustrophobia & needs sedation before MRIs- she doesn't remember the 2/11 MRI being done!).Marland KitchenMarland Kitchen

## 2015-04-22 NOTE — Patient Instructions (Signed)
Today we updated your med list in our EPIC system...    Continue your current medications the same...    We refilled the meds you requested...  Today we did a spirometry breathing test, and your FASTING blood work...    We will contact you w/ the results when available...   We gave you the 2016 FLU vaccine today...  We again reviewed the imperative to [place]!!!  Call for any questions...  Let's plan a follow up visit in 50mo, sooner if needed for problems.Marland KitchenMarland Kitchen

## 2015-04-28 ENCOUNTER — Telehealth: Payer: Self-pay | Admitting: Pulmonary Disease

## 2015-04-28 MED ORDER — TRAMADOL HCL 50 MG PO TABS: 50.0000 mg | ORAL_TABLET | Freq: Three times a day (TID) | ORAL | Status: DC | PRN

## 2015-04-28 NOTE — Telephone Encounter (Signed)
Called and spoke to pt. Pt is requesting refill on Tramadol 50mg . Last filled on 09/29/14 with #90 with 5 refills. Pt last seen on 04/22/15 by SN.   Dr. Lenna Gilford, please advise. Thanks.   Allergies  Allergen Reactions  . Atorvastatin Other (See Comments)    REACTION: INTOL to Lipitor w/ arm pain (3/09)  . Hydrocodone-Acetaminophen Other (See Comments)    REACTION: hallucinations  . Morphine Other (See Comments)    REACTION: hallucinations  . Topamax [Topiramate] Nausea And Vomiting    *dizziness*    Current Outpatient Prescriptions on File Prior to Visit  Medication Sig Dispense Refill  . acetaminophen (TYLENOL) 500 MG tablet Take 500 mg by mouth every 6 (six) hours as needed for mild pain.     Marland Kitchen aspirin 325 MG tablet Take 325 mg by mouth daily.      . calcium gluconate 500 MG tablet Take 1 tablet by mouth daily.    . cholecalciferol (VITAMIN D) 1000 UNITS tablet Take 1,000 Units by mouth daily.      . clopidogrel (PLAVIX) 75 MG tablet TAKE 1 TABLET DAILY 90 tablet 0  . clorazepate (TRANXENE) 7.5 MG tablet Take 1 tablet (7.5 mg total) by mouth 3 (three) times daily as needed for anxiety. 90 tablet 5  . hydroxypropyl methylcellulose / hypromellose (ISOPTO TEARS / GONIOVISC) 2.5 % ophthalmic solution Place 1 drop into both eyes as needed for dry eyes.    Marland Kitchen levothyroxine (SYNTHROID, LEVOTHROID) 75 MCG tablet TAKE 1 TABLET DAILY BEFORE BREAKFAST 90 tablet 2  . metoprolol tartrate (LOPRESSOR) 25 MG tablet Take 1 tablet (25 mg total) by mouth 2 (two) times daily. 180 tablet 3  . pramipexole (MIRAPEX) 0.25 MG tablet TAKE 1 TABLET AT BEDTIME FOR RESTLESS LEG SYMPTOMS 90 tablet 6  . pravastatin (PRAVACHOL) 40 MG tablet TAKE 1 TABLET DAILY 90 tablet 0  . senna-docusate (SENOKOT-S) 8.6-50 MG per tablet Take 1 tablet by mouth at bedtime.     . traMADol (ULTRAM) 50 MG tablet Take 1 tablet (50 mg total) by mouth 3 (three) times daily as needed for moderate pain. 90 tablet 5   No current  facility-administered medications on file prior to visit.

## 2015-04-28 NOTE — Telephone Encounter (Signed)
Per SN>>Ok to send 2 week refill of tramadol to [facility] and regular 90 day supply to [facility] and spoke with pt and informed of refill Pharmacies verified with pt  Nothing further is needed

## 2015-05-04 ENCOUNTER — Other Ambulatory Visit: Payer: Self-pay | Admitting: Pulmonary Disease

## 2015-05-11 ENCOUNTER — Telehealth: Payer: Self-pay | Admitting: Pulmonary Disease

## 2015-05-11 MED ORDER — TRAMADOL HCL 50 MG PO TABS: 50.0000 mg | ORAL_TABLET | Freq: Three times a day (TID) | ORAL | Status: DC | PRN

## 2015-05-11 NOTE — Telephone Encounter (Signed)
Called and spoke to pt. Pt requesting tramadol refill. Per last phone note from 11/16 SN approved [facility] and [facility] supply to [facility]. [facility] and was informed the 90 day rx was not sent in but the 2 week rx was sent to the local pharmacy. Pt is requesting a new rx sent to local pharmacy to last her [facility]. [facility]11/2015.   Dr. Lenna Gilford, please advise. Thanks.   Allergies  Allergen Reactions  . Atorvastatin Other (See Comments)    REACTION: INTOL to Lipitor w/ arm pain (3/09)  . Hydrocodone-Acetaminophen Other (See Comments)    REACTION: hallucinations  . Morphine Other (See Comments)    REACTION: hallucinations  . Topamax [Topiramate] Nausea And Vomiting    *dizziness*    Current Outpatient Prescriptions on File Prior to Visit  Medication Sig Dispense Refill  . acetaminophen (TYLENOL) 500 MG tablet Take 500 mg by mouth every 6 (six) hours as needed for mild pain.     Marland Kitchen aspirin 325 MG tablet Take 325 mg by mouth daily.      . calcium gluconate 500 MG tablet Take 1 tablet by mouth daily.    . cholecalciferol (VITAMIN D) 1000 UNITS tablet Take 1,000 Units by mouth daily.      . clopidogrel (PLAVIX) 75 MG tablet TAKE 1 TABLET DAILY 90 tablet 0  . clorazepate (TRANXENE) 7.5 MG tablet Take 1 tablet (7.5 mg total) by mouth 3 (three) times daily as needed for anxiety. 90 tablet 5  . hydroxypropyl methylcellulose / hypromellose (ISOPTO TEARS / GONIOVISC) 2.5 % ophthalmic solution Place 1 drop into both eyes as needed for dry eyes.    Marland Kitchen levothyroxine (SYNTHROID, LEVOTHROID) 75 MCG tablet TAKE 1 TABLET DAILY BEFORE BREAKFAST 90 tablet 2  . metoprolol tartrate (LOPRESSOR) 25 MG tablet Take 1 tablet (25 mg total) by mouth 2 (two) times daily. 180 tablet 3  . pramipexole (MIRAPEX) 0.25 MG tablet TAKE 1 TABLET AT BEDTIME FOR RESTLESS LEG SYMPTOMS 90 tablet 2  . pravastatin (PRAVACHOL) 40 MG tablet TAKE  1 TABLET DAILY 90 tablet 0  . senna-docusate (SENOKOT-S) 8.6-50 MG per tablet Take 1 tablet by mouth at bedtime.      No current facility-administered medications on file prior to visit.

## 2015-05-11 NOTE — Telephone Encounter (Signed)
Per SN>> Ok to refill 2 week supply to pt's local pharmacy. Call pt to inform   Refill called into pt's pharmacy  Pt notified of refill  Nothing further is needed

## 2015-05-15 ENCOUNTER — Other Ambulatory Visit: Payer: Self-pay | Admitting: Pulmonary Disease

## 2015-05-31 ENCOUNTER — Ambulatory Visit (INDEPENDENT_AMBULATORY_CARE_PROVIDER_SITE_OTHER): Payer: Medicare Other | Admitting: Neurology

## 2015-05-31 ENCOUNTER — Encounter: Payer: Self-pay | Admitting: Neurology

## 2015-05-31 VITALS — BP 126/70 | HR 76 | Ht 62.0 in | Wt 126.0 lb

## 2015-05-31 DIAGNOSIS — I200 Unstable angina: Secondary | ICD-10-CM | POA: Diagnosis not present

## 2015-05-31 DIAGNOSIS — G44229 Chronic tension-type headache, not intractable: Secondary | ICD-10-CM | POA: Insufficient documentation

## 2015-05-31 DIAGNOSIS — M4802 Spinal stenosis, cervical region: Secondary | ICD-10-CM | POA: Diagnosis not present

## 2015-05-31 DIAGNOSIS — Z720 Tobacco use: Secondary | ICD-10-CM

## 2015-05-31 MED ORDER — TIZANIDINE HCL 2 MG PO TABS
ORAL_TABLET | ORAL | Status: DC
Start: 2015-05-31 — End: 2015-12-28

## 2015-05-31 NOTE — Patient Instructions (Signed)
1.  Start muscle relaxant tizanidine 2mg .  Take 1 tablet at bedtime for 7 days, then 1 tablet in morning and 1 tablet at bedtime.  Caution for drowsiness.  Call for refill and we can increase dose further if needed. 2.  Limit tramadol to no more than 2 days out of the week 3.  If needed, may need to refer you to a pain specialist for neck injections 4.  Follow up in 3 months.

## 2015-05-31 NOTE — Progress Notes (Signed)
NEUROLOGY FOLLOW UP OFFICE NOTE  Ana Santos [id number]  HISTORY OF PRESENT ILLNESS: Ana Santos is a 74 year old right-handed woman with tobacco abuse, hypertension, hypothyroidism, restless leg, and history of basilar tip aneurysm status post coiling in 2010, right cerebellar infarct (incidental finding), right vestibular schwannoma status post resection 2002, and recurrent TIA who follows up for primary stabbing headache and tension-type headache.  Imaging of head MRI/MRA from October reviewed.    UPDATE: To evaluate the new severe headaches, MRI of the head was performed on 03/14/15 showed chronic cerebrovascular disease but no acute intracranial process.  It did reveal spinal stenosis at C3-4 and C4-5 with cord flattening but no signal abnormality.  She denies gait instability, urinary retention or extremity weakness.  MRA of the head showed post coiling basilar tip aneurysm without evidence of recurrence.  Sed Rate was 13.  Recent LDL was 75.  She is taking pravastatin 40mg  daily.  She no longer has stabbing headaches (just seldom).  She still has a constant non-throbbing headache from back of head and temples.  It fluctuates in intensity.  She is still taking tramadol daily.  HISTORY: In 2010, she exhibited dizziness and weakness in the arms.  She was found to have a basilar tip aneurysm, which was coiled.  She has a history of recurrent stroke and TIAs.  She had a right occipital stroke on 07/27/13, presenting with diplopia, blurred vision and gait instability.  She had recurrent TIA symptoms on 08/26/13 with vertical diplopia and generalized weakness, lasting 1 hour.  She was brought to [facility] where EMS reported right dilated pupil.  CT of the brain with and without contrast and CTA of the head and neck showed aneurysm tip coiling and stenting of basilar tip aneurysm with no recurrent aneurysm, no intracranial stenosis, carotid bifurcation atherosclerotic disease with mild  bilateral ICA stenosis (40%).    In November 2015, she began experiencing a new type of headache.  It is a stabbing pain just above the right eye, lasting up to one minute. There is associated lightheadedness, but no visual disturbance, focal numbness or weakness.  It initially occurred anywhere from once every other day to twice a day.  Since it is so brief, she doesn't take anything for it, but it is scary due to her concern of aneurysm.  She did not respond well to gabapentin.  She has chronic tinnitus in the right ear since her vestibular schwannoma.  PAST MEDICAL HISTORY: Past Medical History  Diagnosis Date  . COPD (chronic obstructive pulmonary disease) ([place])   . Cigarette smoker   . Mitral valve prolapse   . Palpitations   . Cerebrovascular disease   . Aneurysm ([place])   . Venous insufficiency   . Hyperlipidemia   . Hypothyroidism   . IBS (irritable bowel syndrome)   . Nephrolithiasis   . Tendinitis of right elbow   . Fibromyalgia   . Osteopenia   . Vitamin D deficiency   . AN (acoustic neuroma) ([place])   . Anxiety   . Insomnia   . Coronary artery disease     MEDICATIONS: Current Outpatient Prescriptions on File Prior to Visit  Medication Sig Dispense Refill  . acetaminophen (TYLENOL) 500 MG tablet Take 500 mg by mouth every 6 (six) hours as needed for mild pain.     Marland Kitchen aspirin 325 MG tablet Take 325 mg by mouth daily.      . calcium gluconate 500 MG tablet Take 1 tablet by  mouth daily.    . cholecalciferol (VITAMIN D) 1000 UNITS tablet Take 1,000 Units by mouth daily.      . clopidogrel (PLAVIX) 75 MG tablet TAKE 1 TABLET DAILY 90 tablet 0  . clorazepate (TRANXENE) 7.5 MG tablet Take 1 tablet (7.5 mg total) by mouth 3 (three) times daily as needed for anxiety. 90 tablet 5  . hydroxypropyl methylcellulose / hypromellose (ISOPTO TEARS / GONIOVISC) 2.5 % ophthalmic solution Place 1 drop into both eyes as needed for dry eyes.    Marland Kitchen levothyroxine (SYNTHROID, LEVOTHROID) 75 MCG  tablet TAKE 1 TABLET DAILY BEFORE BREAKFAST 90 tablet 2  . metoprolol tartrate (LOPRESSOR) 25 MG tablet Take 1 tablet (25 mg total) by mouth 2 (two) times daily. 180 tablet 3  . pramipexole (MIRAPEX) 0.25 MG tablet TAKE 1 TABLET AT BEDTIME FOR RESTLESS LEG SYMPTOMS 90 tablet 2  . pravastatin (PRAVACHOL) 40 MG tablet TAKE 1 TABLET DAILY 90 tablet 2  . senna-docusate (SENOKOT-S) 8.6-50 MG per tablet Take 1 tablet by mouth at bedtime.     . traMADol (ULTRAM) 50 MG tablet Take 1 tablet (50 mg total) by mouth 3 (three) times daily as needed for moderate pain. 45 tablet 0   No current facility-administered medications on file prior to visit.    ALLERGIES: Allergies  Allergen Reactions  . Atorvastatin Other (See Comments)    REACTION: INTOL to Lipitor w/ arm pain (3/09)  . Hydrocodone-Acetaminophen Other (See Comments)    REACTION: hallucinations  . Morphine Other (See Comments)    REACTION: hallucinations  . Topamax [Topiramate] Nausea And Vomiting    *dizziness*    FAMILY HISTORY: Family History  Problem Relation Age of Onset  . Heart attack Mother   . Diabetes Mother   . Cancer Father   . Diabetes Sister   . Diabetes Brother   . Cancer Sister     LUNG  . Cancer Brother     Lung    SOCIAL HISTORY: Social History   Social History  . Marital Status: Married    Spouse Name: N/A  . Number of Children: N/A  . Years of Education: N/A   Occupational History  . alterations    Social History Main Topics  . Smoking status: Current Every Day Smoker -- 0.25 packs/day for 50 years    Types: Cigarettes  . Smokeless tobacco: Never Used     Comment: 4-5 cigs daily  is aware she needs to quit  . Alcohol Use: No  . Drug Use: No  . Sexual Activity: No   Other Topics Concern  . Not on file   Social History Narrative    REVIEW OF SYSTEMS: Constitutional: No fevers, chills, or sweats, no generalized fatigue, change in appetite Eyes: No visual changes, double vision, eye  pain Ear, nose and throat: No hearing loss, ear pain, nasal congestion, sore throat Cardiovascular: No chest pain, palpitations Respiratory:  No shortness of breath at rest or with exertion, wheezes GastrointestinaI: No nausea, vomiting, diarrhea, abdominal pain, fecal incontinence Genitourinary:  No dysuria, urinary retention or frequency Musculoskeletal:  Neck pain Integumentary: No rash, pruritus, skin lesions Neurological: as above Psychiatric: No depression, insomnia, anxiety Endocrine: No palpitations, fatigue, diaphoresis, mood swings, change in appetite, change in weight, increased thirst Hematologic/Lymphatic:  No anemia, purpura, petechiae. Allergic/Immunologic: no itchy/runny eyes, nasal congestion, recent allergic reactions, rashes  PHYSICAL EXAM: Filed Vitals:   05/31/15 0740  BP: 126/70  Pulse: 76   General: No acute distress.  Patient appears well-groomed.  normal body habitus. Head:  Normocephalic/atraumatic Eyes:  Fundoscopic exam unremarkable without vessel changes, exudates, hemorrhages or papilledema. Neck: supple, bilateral paraspinal tenderness, full range of motion Heart:  Regular rate and rhythm Lungs:  Clear to auscultation bilaterally Back: No paraspinal tenderness Neurological Exam: alert and oriented to person, place, and time. Attention span and concentration intact, recent and remote memory intact, fund of knowledge intact.  Speech fluent and not dysarthric, language intact.  CN II-XII intact. Fundoscopic exam unremarkable without vessel changes, exudates, hemorrhages or papilledema.  Bulk and tone normal, muscle strength 5/5 throughout.  Sensation to light touch, temperature and vibration intact.  Deep tendon reflexes 2+ throughout.  Finger to nose and heel to shin testing intact.  Gait normal.  IMPRESSION: Chronic daily tension type headache, likely secondary to cervical spinal stenosis and medication overuse Cervical spinal stenosis without  myelopathy Primary stabbing headache, improved New severe headache.  It sounds like tension-type headache, however she says it is different and severe from her other headaches Cerebrovascular disease with history of TIAs and basilar artery coil.  PLAN: Try tizanidine, starting at 2mg  at bedtime and titrating to twice daily.  Cautioned her about drowsiness Limit tramadol to no more than 2 days out of the week Discussed looking out for symptoms of myelopathy.  Also discussed considering interventional pain specialist for cervical epidural injections ASA and Plavix Pravastatin.  LDL is 75 which is good, but ideally should be less than 70.  Defer to PCP about any statin adjustment Smoking cessation Follow up in 3 months.  Metta Clines, DO  CC:  Teressa Lower, MD

## 2015-06-09 ENCOUNTER — Encounter: Payer: Self-pay | Admitting: *Deleted

## 2015-06-30 ENCOUNTER — Telehealth: Payer: Self-pay | Admitting: Pulmonary Disease

## 2015-06-30 NOTE — Telephone Encounter (Signed)
ATC pt, line rang busy x 3 [facility]

## 2015-07-01 MED ORDER — CLORAZEPATE DIPOTASSIUM 7.5 MG PO TABS: 7.5000 mg | ORAL_TABLET | Freq: Three times a day (TID) | ORAL | Status: DC | PRN

## 2015-07-01 NOTE — Telephone Encounter (Signed)
Patient returned call [phone].

## 2015-07-01 NOTE — Telephone Encounter (Signed)
Per SN okay to refill 1/2-1 tab po TID prn #90 x 5 refills

## 2015-07-01 NOTE — Telephone Encounter (Signed)
rx printed and faxed to [facility] line at [phone] lmtcb X1 for pt to make aware that rx has been sent to pharmacy.

## 2015-07-01 NOTE — Telephone Encounter (Signed)
Spoke with pt, states that she needs a refill on clorazepate-[facility]. Last refill 09/29/14 #90 with 5 refills- take 1 tablet tid prn anxiety.  SN please advise if ok to refill.  Thanks!

## 2015-07-01 NOTE — Telephone Encounter (Signed)
lmtcb x1 for pt. 

## 2015-07-02 NOTE — Telephone Encounter (Signed)
Spoke with pt, aware of rx refill.  Nothing further needed.

## 2015-07-08 ENCOUNTER — Telehealth: Payer: Self-pay

## 2015-07-08 DIAGNOSIS — M4802 Spinal stenosis, cervical region: Secondary | ICD-10-CM

## 2015-07-08 MED ORDER — GABAPENTIN 300 MG PO CAPS: 300.0000 mg | ORAL_CAPSULE | Freq: Every day | ORAL | Status: DC

## 2015-07-08 MED ORDER — DIAZEPAM 5 MG PO TABS: 5.0000 mg | ORAL_TABLET | Freq: Once | ORAL | Status: DC

## 2015-07-08 NOTE — Telephone Encounter (Signed)
Valium 5mg  to take 30 minutes prior to scan.  Will need a driver

## 2015-07-08 NOTE — Addendum Note (Signed)
Addended by: Gerda Diss A on: 07/08/2015 03:28 PM   Modules accepted: Orders

## 2015-07-08 NOTE — Telephone Encounter (Signed)
We can start gabapentin 300mg  at bedtime to help with pain.  As I discussed, she has disc bulges in the neck which is pressing on the spinal cord.  This was seen on MRI of the brain. I would like to get MRI of cervical spine to assess cervical stenosis.  I would like to try and get this done as soon as possible.

## 2015-07-08 NOTE — Telephone Encounter (Signed)
MRI scheduled for 07/14/15 @ 12 - [facility] [place]. Pt aware. No authorization needed.

## 2015-07-08 NOTE — Telephone Encounter (Signed)
Pt states she is very claustrophobic and will need something to take day of exam.

## 2015-07-08 NOTE — Telephone Encounter (Signed)
Patient called to report numbness, tingling, and pain in both legs and her left arm. Has been going on since Tuesday. It is constant. Has been keeping pt up at night, no sleep last two nights. Pt stated she was told to call if she noticed any symptoms in her extremities. Please advise  [id number]

## 2015-07-14 ENCOUNTER — Ambulatory Visit
Admission: RE | Admit: 2015-07-14 | Discharge: 2015-07-14 | Disposition: A | Discharge status: Home / Self Care | Payer: Medicare Other | Source: Ambulatory Visit | Attending: Neurology | Admitting: Neurology

## 2015-07-14 DIAGNOSIS — M4802 Spinal stenosis, cervical region: Secondary | ICD-10-CM

## 2015-07-14 MED ORDER — GADOBENATE DIMEGLUMINE 529 MG/ML IV SOLN
11.0000 mL | Freq: Once | INTRAVENOUS | Status: AC | PRN
  Administered 2015-07-14: 11 mL via INTRAVENOUS

## 2015-07-15 ENCOUNTER — Telehealth: Payer: Self-pay

## 2015-07-15 NOTE — Telephone Encounter (Signed)
-----   Message from Pieter Partridge, DO sent at 07/15/2015  7:27 AM EST ----- MRI of cervical spine shows significant arthritic changes which are probably pressing on some nerves and touching the spinal cord but the spinal cord itself shows no evidence of damage.  Normally, I would continue with gabapentin for pain control.  If needed, I can refer to interventional pain specialist as well.

## 2015-07-15 NOTE — Telephone Encounter (Signed)
Message relayed to patient. Verbalized understanding and denied questions.   

## 2015-07-15 NOTE — Telephone Encounter (Signed)
Left message on machine for pt to return call to the office.  

## 2015-08-10 ENCOUNTER — Other Ambulatory Visit: Payer: Self-pay | Admitting: Pulmonary Disease

## 2015-08-25 ENCOUNTER — Telehealth: Payer: Self-pay | Admitting: Pulmonary Disease

## 2015-08-25 MED ORDER — TRAMADOL HCL 50 MG PO TABS: 50.0000 mg | ORAL_TABLET | Freq: Three times a day (TID) | ORAL | Status: DC | PRN

## 2015-08-25 NOTE — Telephone Encounter (Signed)
Pt calling requesting refill on tramadol. [facility] Pt last had refill 05/11/15  #45 tabs x 0 refills 1 po tid prn pain Please advise SN thanks

## 2015-08-25 NOTE — Telephone Encounter (Signed)
Per SN >> okay to refill Tramadol #270 1 TID prn, no refill  Rx printed and given to SN to sign. Rx will need to be faxed to [facility] at [phone]. Spoke with Daneil Dan, she will fax this once it's signed.

## 2015-09-06 ENCOUNTER — Other Ambulatory Visit: Payer: Self-pay

## 2015-09-06 ENCOUNTER — Telehealth: Payer: Self-pay

## 2015-09-06 MED ORDER — GABAPENTIN 300 MG PO CAPS: 300.0000 mg | ORAL_CAPSULE | Freq: Every day | ORAL | Status: DC

## 2015-09-06 MED ORDER — GABAPENTIN 300 MG PO CAPS
300.0000 mg | ORAL_CAPSULE | Freq: Every day | ORAL | Status: DC
Start: 2015-09-06 — End: 2015-11-18

## 2015-09-06 NOTE — Telephone Encounter (Signed)
Pt called for refill. While on the phone, but stated that her left arm and left leg has been having severe pain in the muscles for about 3 weeks. Pain in constant. Pt has not seen PCP for pain. Has f/u with you on 09/30/15. Please advise.

## 2015-09-06 NOTE — Telephone Encounter (Signed)
She has a lot of cervical disc disease causing pinched nerves in the neck.  We can increase gabapentin to 600mg  at bedtime.  Otherwise, she may need to see a spine specialist.

## 2015-09-07 ENCOUNTER — Ambulatory Visit (INDEPENDENT_AMBULATORY_CARE_PROVIDER_SITE_OTHER): Payer: Medicare Other | Admitting: Neurology

## 2015-09-07 ENCOUNTER — Encounter: Payer: Self-pay | Admitting: Neurology

## 2015-09-07 ENCOUNTER — Ambulatory Visit
Admission: RE | Admit: 2015-09-07 | Discharge: 2015-09-07 | Disposition: A | Discharge status: Home / Self Care | Payer: Medicare Other | Source: Ambulatory Visit | Attending: Neurology | Admitting: Neurology

## 2015-09-07 ENCOUNTER — Telehealth: Payer: Self-pay

## 2015-09-07 VITALS — BP 130/60 | HR 67 | Ht 63.0 in | Wt 125.0 lb

## 2015-09-07 DIAGNOSIS — R200 Anesthesia of skin: Secondary | ICD-10-CM

## 2015-09-07 DIAGNOSIS — M25512 Pain in left shoulder: Secondary | ICD-10-CM

## 2015-09-07 DIAGNOSIS — M19012 Primary osteoarthritis, left shoulder: Secondary | ICD-10-CM | POA: Diagnosis not present

## 2015-09-07 DIAGNOSIS — R51 Headache: Secondary | ICD-10-CM

## 2015-09-07 DIAGNOSIS — G4761 Periodic limb movement disorder: Secondary | ICD-10-CM | POA: Diagnosis not present

## 2015-09-07 DIAGNOSIS — M5010 Cervical disc disorder with radiculopathy, unspecified cervical region: Secondary | ICD-10-CM

## 2015-09-07 DIAGNOSIS — R208 Other disturbances of skin sensation: Secondary | ICD-10-CM | POA: Diagnosis not present

## 2015-09-07 DIAGNOSIS — G4486 Cervicogenic headache: Secondary | ICD-10-CM

## 2015-09-07 DIAGNOSIS — F17200 Nicotine dependence, unspecified, uncomplicated: Secondary | ICD-10-CM

## 2015-09-07 MED ORDER — GABAPENTIN 300 MG PO CAPS: ORAL_CAPSULE | ORAL | Status: DC

## 2015-09-07 NOTE — Telephone Encounter (Signed)
Message relayed to patient. Verbalized understanding and denied questions.   

## 2015-09-07 NOTE — Telephone Encounter (Signed)
-----   Message from Pieter Partridge, DO sent at 09/07/2015 12:34 PM EDT ----- Regarding: FW: X-ray doesn't show anything significant ----- Message -----    From: Rad Results In Interface    Sent: 09/07/2015  10:47 AM      To: Pieter Partridge, DO

## 2015-09-07 NOTE — Patient Instructions (Signed)
1.  Increase gabapentin to 1 pill in morning and 1 pill at bedtime for 7 days, then increase to 1 pill in morning and 2 pills at bedtime.  Call in 4 weeks with update. 2.  Will schedule nerve study of left upper and lower extremities 3.  Will get X-ray of left shoulder 4.  Follow up

## 2015-09-07 NOTE — Progress Notes (Signed)
NEUROLOGY FOLLOW UP OFFICE NOTE  Ana Santos [id number]  HISTORY OF PRESENT ILLNESS: Ana Santos is a 75 year old right-handed woman with tobacco abuse, hypertension, hypothyroidism, restless leg, and history of basilar tip aneurysm status post coiling in 2010, right cerebellar infarct (incidental finding), right vestibular schwannoma status post resection 2002, and recurrent TIA who follows up for cervical spinal stenosis  and tension-type headache.  History also obtained by husband.  MRI of cervical spine reviewed.  UPDATE: In January, she began exhibiting numbness, tingling and pain in both legs and left arm.  She was started on gabapentin 300mg  at bedtime.  MRI of the cervical spine was performed on 07/14/15, which showed multi-level degenerative disc disease with severe left foraminal stenosis at C3-4, severe bilateral foraminal stenosis and moderate central canal stenosis at C4-5, severe bilateral foraminal stenosis and mild spinal stenosis at C5-6, and C6-7 central disc protrusion deforming ventral cervical spinal cord with moderate bilateral foraminal stenosis.  Over the past couple of months, she has had increased pain in the left upper arm, sometimes radiating into the forearm.  She also reports an ice-cold sensation of her left shin.  At night in bed, her leg will jerk.  She continues to have the headache. She limits tramadol to twice a week.  HISTORY: In 2010, she exhibited dizziness and weakness in the arms.  She was found to have a basilar tip aneurysm, which was coiled.  She has a history of recurrent stroke and TIAs.  She had a right occipital stroke on 07/27/13, presenting with diplopia, blurred vision and gait instability.  She had recurrent TIA symptoms on 08/26/13 with vertical diplopia and generalized weakness, lasting 1 hour.  She was brought to [facility] where EMS reported right dilated pupil.  CT of the brain with and without contrast and CTA of the head and neck  showed aneurysm tip coiling and stenting of basilar tip aneurysm with no recurrent aneurysm, no intracranial stenosis, carotid bifurcation atherosclerotic disease with mild bilateral ICA stenosis (40%).    Last year, she developed a headache, described as a severe vice-like pressure, 8/10, across her forehead. MRI of the head was performed on 03/14/15 showed chronic cerebrovascular disease but no acute intracranial process.  It did reveal spinal stenosis at C3-4 and C4-5 with cord flattening but no signal abnormality.    She has history of primary stabbing headache.  It is a stabbing pain just above the right eye, lasting up to one minute. There is associated lightheadedness, but no visual disturbance, focal numbness or weakness.  It has since subsided.  She takes tramadol.  She has chronic tinnitus in the right ear since her vestibular schwannoma.  PAST MEDICAL HISTORY: Past Medical History  Diagnosis Date  . COPD (chronic obstructive pulmonary disease) ([place])   . Cigarette smoker   . Mitral valve prolapse   . Palpitations   . Cerebrovascular disease   . Aneurysm ([place])   . Venous insufficiency   . Hyperlipidemia   . Hypothyroidism   . IBS (irritable bowel syndrome)   . Nephrolithiasis   . Tendinitis of right elbow   . Fibromyalgia   . Osteopenia   . Vitamin D deficiency   . AN (acoustic neuroma) ([place])   . Anxiety   . Insomnia   . Coronary artery disease     MEDICATIONS: Current Outpatient Prescriptions on File Prior to Visit  Medication Sig Dispense Refill  . acetaminophen (TYLENOL) 500 MG tablet Take 500 mg by mouth  every 6 (six) hours as needed for mild pain.     Marland Kitchen aspirin 325 MG tablet Take 325 mg by mouth daily.      . calcium gluconate 500 MG tablet Take 1 tablet by mouth daily.    . cholecalciferol (VITAMIN D) 1000 UNITS tablet Take 1,000 Units by mouth daily.      . clopidogrel (PLAVIX) 75 MG tablet TAKE 1 TABLET DAILY 90 tablet 0  . clorazepate (TRANXENE) 7.5 MG tablet  Take 1 tablet (7.5 mg total) by mouth 3 (three) times daily as needed for anxiety. 90 tablet 5  . diazepam (VALIUM) 5 MG tablet Take 1 tablet (5 mg total) by mouth once. Take 20 - 30 minutes before scan. 1 tablet 0  . hydroxypropyl methylcellulose / hypromellose (ISOPTO TEARS / GONIOVISC) 2.5 % ophthalmic solution Place 1 drop into both eyes as needed for dry eyes.    Marland Kitchen levothyroxine (SYNTHROID, LEVOTHROID) 75 MCG tablet TAKE 1 TABLET DAILY BEFORE BREAKFAST 90 tablet 2  . metoprolol tartrate (LOPRESSOR) 25 MG tablet TAKE 1 TABLET TWICE A DAY 180 tablet 2  . pramipexole (MIRAPEX) 0.25 MG tablet TAKE 1 TABLET AT BEDTIME FOR RESTLESS LEG SYMPTOMS 90 tablet 2  . pravastatin (PRAVACHOL) 40 MG tablet TAKE 1 TABLET DAILY 90 tablet 2  . senna-docusate (SENOKOT-S) 8.6-50 MG per tablet Take 1 tablet by mouth at bedtime.     Marland Kitchen tiZANidine (ZANAFLEX) 2 MG tablet Take 1tab at bedtime for 7 days, then 1 tab twice daily.  Call for refills. 60 tablet 0  . traMADol (ULTRAM) 50 MG tablet Take 1 tablet (50 mg total) by mouth 3 (three) times daily as needed for moderate pain. 270 tablet 0   No current facility-administered medications on file prior to visit.    ALLERGIES: Allergies  Allergen Reactions  . Atorvastatin Other (See Comments)    REACTION: INTOL to Lipitor w/ arm pain (3/09)  . Hydrocodone-Acetaminophen Other (See Comments)    REACTION: hallucinations  . Morphine Other (See Comments)    REACTION: hallucinations  . Topamax [Topiramate] Nausea And Vomiting    *dizziness*    FAMILY HISTORY: Family History  Problem Relation Age of Onset  . Heart attack Mother   . Diabetes Mother   . Cancer Father   . Diabetes Sister   . Diabetes Brother   . Cancer Sister     LUNG  . Cancer Brother     Lung    SOCIAL HISTORY: Social History   Social History  . Marital Status: Married    Spouse Name: N/A  . Number of Children: N/A  . Years of Education: N/A   Occupational History  . alterations     Social History Main Topics  . Smoking status: Current Every Day Smoker -- 0.25 packs/day for 50 years    Types: Cigarettes  . Smokeless tobacco: Never Used     Comment: 4-5 cigs daily  is aware she needs to quit  . Alcohol Use: No  . Drug Use: No  . Sexual Activity: No   Other Topics Concern  . Not on file   Social History Narrative    REVIEW OF SYSTEMS: Constitutional: No fevers, chills, or sweats, no generalized fatigue, change in appetite Eyes: No visual changes, double vision, eye pain Ear, nose and throat: No hearing loss, ear pain, nasal congestion, sore throat Cardiovascular: No chest pain, palpitations Respiratory:  No shortness of breath at rest or with exertion, wheezes GastrointestinaI: No nausea, vomiting, diarrhea, abdominal  pain, fecal incontinence Genitourinary:  No dysuria, urinary retention or frequency Musculoskeletal:  No neck pain, back pain Integumentary: No rash, pruritus, skin lesions Neurological: as above Psychiatric: No depression, insomnia, anxiety Endocrine: No palpitations, fatigue, diaphoresis, mood swings, change in appetite, change in weight, increased thirst Hematologic/Lymphatic:  No anemia, purpura, petechiae. Allergic/Immunologic: no itchy/runny eyes, nasal congestion, recent allergic reactions, rashes  PHYSICAL EXAM: Filed Vitals:   09/07/15 0918  BP: 130/60  Pulse: 67   General: No acute distress.  Patient appears well-groomed.  normal body habitus. Head:  Normocephalic/atraumatic Eyes:  Fundoscopic exam unremarkable without vessel changes, exudates, hemorrhages or papilledema. Neck: supple, no paraspinal tenderness, full range of motion Heart:  Regular rate and rhythm Lungs:  Clear to auscultation bilaterally Back: No paraspinal tenderness Neurological Exam: alert and oriented to person, place, and time. Attention span and concentration intact, recent and remote memory intact, fund of knowledge intact.  Speech fluent and not  dysarthric, language intact.  CN II-XII intact. Fundoscopic exam unremarkable without vessel changes, exudates, hemorrhages or papilledema.  Bulk and tone normal, muscle strength 5/5 throughout.  Pain to palpation of left acromion humeral joint.  Decreased pinprick sensation over the left arm, dorsum of left hand and index finger, as well as the dorsum of left foot and shin.  Decreased vibration sensation in toes of left foot.  Deep tendon reflexes brisk, toes downgoing.  Finger to nose and heel to shin testing intact.  Gait briefly unsteady but overall normal stride.  Romberg with sway.  IMPRESSION: Chronic cervicogenic headache Cervical spinal stenosis with left cervical radiculopathy Left leg discomfort with period limb movements - rule out lumbar radiculopathy or from cervical stenosis Tobacco use  PLAN: 1.  Titrate gabapentin to 300mg  in AM and 600mg  at night 2.  NCV-EMG of left upper and lower extremities 3.  XR of left shoulder to evaluate acromion humeral joint 4.  Further management pending results 5.  Smoking cessation  Metta Clines, DO  CC:  Teressa Lower, MD

## 2015-09-07 NOTE — Telephone Encounter (Deleted)
-----   Message from Pieter Partridge, DO sent at 09/07/2015 12:34 PM EDT ----- Regarding: FW: X-ray doesn't show anything significant ----- Message -----    From: Rad Results In Interface    Sent: 09/07/2015  10:47 AM      To: Pieter Partridge, DO

## 2015-09-09 ENCOUNTER — Telehealth: Payer: Self-pay | Admitting: Neurology

## 2015-09-09 NOTE — Telephone Encounter (Signed)
Answered to the best of my ability.

## 2015-09-09 NOTE — Telephone Encounter (Signed)
Ana Santos 2041-02-04 called and has a question about her medication. Her call back number is [phone]. Thank You

## 2015-09-14 ENCOUNTER — Ambulatory Visit (INDEPENDENT_AMBULATORY_CARE_PROVIDER_SITE_OTHER): Payer: Medicare Other | Admitting: Neurology

## 2015-09-14 DIAGNOSIS — M5010 Cervical disc disorder with radiculopathy, unspecified cervical region: Secondary | ICD-10-CM

## 2015-09-14 NOTE — Procedures (Signed)
[facility]  [address], [address]  [place], [place] [address] Tel: [phone] Fax:  [phone] Test Date:  09/14/2015  Patient: Ana Santos DOB: 03-14-1941 Physician: Narda Amber, DO  Sex: Female Height: 5\' 2"  Ref Phys: Metta Clines  ID#: [id number] Temp: 33.4C Technician: Jerilynn Mages. Dean   Patient Complaints: This is a 75 year old female referred for evaluation of26 years old with chief complaint of left arm and leg pain, tingling and numbness.  NCV & EMG Findings: Extensive electrodiagnostic testing of the left upper and lower extremity shows: 1. All sensory responses including the left median, ulnar, sural, and superficial peroneal nerves are within normal limits. Mixed palmar sensory responses are also within normal limits. 2. All motor responses including the left median, ulnar, peroneal, and tibial nerves are within normal limits. 3. There is no evidence of active or chronic motor axon loss changes affecting any of the tested muscles. Motor unit configuration and recruitment pattern is within normal limits.  Impression: This is a normal study of the left upper and lower extremities. In particular, there is no evidence of a generalized sensorimotor polyneuropathy, cervical/lumbosacral radiculopathy, or carpal tunnel syndrome.     ___________________________ Narda Amber, DO    Nerve Conduction Studies Anti Sensory Summary Table   Site NR Peak (ms) Norm Peak (ms) P-T Amp (V) Norm P-T Amp  Left Median Anti Sensory (2nd Digit)  33.4C  Wrist    3.8 <3.8 36.1 >10  Left Sup Peroneal Anti Sensory (Ant Lat Mall)  33.4C  12 cm    3.1 <4.6 6.0 >3  Left Sural Anti Sensory (Lat Mall)  33.4C  Calf    3.0 <4.6 10.4 >3  Left Ulnar Anti Sensory (5th Digit)  33.4C  Wrist    2.9 <3.2 21.4 >5   Motor Summary Table   Site NR Onset (ms) Norm Onset (ms) O-P Amp (mV) Norm O-P Amp Site1 Site2 Delta-0 (ms) Dist (cm) Vel (m/s) Norm Vel (m/s)  Left Median Motor (Abd Poll  Brev)  33.4C  Wrist    3.1 <4.0 6.0 >5 Elbow Wrist 5.3 27.0 51 >50  Elbow    8.4  6.0         Left Peroneal Motor (Ext Dig Brev)  33.4C  Ankle    3.0 <6.0 4.3 >2.5 B Fib Ankle 6.4 30.0 47 >40  B Fib    9.4  4.3  Poplt B Fib 1.9 10.0 53 >40  Poplt    11.3  4.3         Left Tibial Motor (Abd Hall Brev)  33.4C  Ankle    3.1 <6.0 8.1 >4 Knee Ankle 8.5 37.0 44 >40  Knee    11.6  4.6         Left Ulnar Motor (Abd Dig Minimi)  33.4C  Wrist    2.7 <3.1 7.8 >7 B Elbow Wrist 3.6 20.0 56 >50  B Elbow    6.3  7.6  A Elbow B Elbow 1.7 10.0 59 >50  A Elbow    8.0  6.8          Comparison Summary Table   Site NR Peak (ms) Norm Peak (ms) P-T Amp (V) Site1 Site2 Delta-P (ms) Norm Delta (ms)  Left Median/Ulnar Palm Comparison (Wrist - 8cm)  33.4C  Median Palm    2.1 <2.2 79.2 Median Palm Ulnar Palm 0.1   Ulnar Palm    2.0 <2.2 30.8       EMG  Side Muscle Ins Act Fibs Psw Fasc Number Recrt Dur Dur. Amp Amp. Poly Poly. Comment  Left 1stDorInt Nml Nml Nml Nml Nml Nml Nml Nml Nml Nml Nml Nml N/A  Left Ext Indicis Nml Nml Nml Nml Nml Nml Nml Nml Nml Nml Nml Nml N/A  Left PronatorTeres Nml Nml Nml Nml Nml Nml Nml Nml Nml Nml Nml Nml N/A  Left Biceps Nml Nml Nml Nml Nml Nml Nml Nml Nml Nml Nml Nml N/A  Left Triceps Nml Nml Nml Nml Nml Nml Nml Nml Nml Nml Nml Nml N/A  Left Deltoid Nml Nml Nml Nml Nml Nml Nml Nml Nml Nml Nml Nml N/A  Left AntTibialis Nml Nml Nml Nml Nml Nml Nml Nml Nml Nml Nml Nml N/A  Left Gastroc Nml Nml Nml Nml Nml Nml Nml Nml Nml Nml Nml Nml N/A  Left Flex Dig Long Nml Nml Nml Nml Nml Nml Nml Nml Nml Nml Nml Nml N/A  Left RectFemoris Nml Nml Nml Nml Nml Nml Nml Nml Nml Nml Nml Nml N/A  Left GluteusMed Nml Nml Nml Nml Nml Nml Nml Nml Nml Nml Nml Nml N/A  Left BicepsFemS Nml Nml Nml Nml Nml Nml Nml Nml Nml Nml Nml Nml N/A      Waveforms:

## 2015-09-15 ENCOUNTER — Telehealth: Payer: Self-pay

## 2015-09-15 NOTE — Telephone Encounter (Signed)
Left message w/ husband for pt to call back.

## 2015-09-15 NOTE — Telephone Encounter (Signed)
-----   Message from Pieter Partridge, DO sent at 09/15/2015 12:27 PM EDT ----- The nerve study was normal.  At this point, I would recommend that she follow up with her spine surgeon to evaluate if the arthritic changes in the neck could be causing her symptoms.

## 2015-09-23 ENCOUNTER — Telehealth: Payer: Self-pay | Admitting: Neurology

## 2015-09-23 NOTE — Telephone Encounter (Signed)
Ana Santos [birth year]. She called today wanting to speak with you regarding being referred to a Spine Specialist . Will you please return her call at [phone]. Thank you

## 2015-09-23 NOTE — Telephone Encounter (Signed)
Called pt. She was given [facility] contact info.

## 2015-09-27 ENCOUNTER — Other Ambulatory Visit: Payer: Self-pay | Admitting: Neurosurgery

## 2015-09-27 DIAGNOSIS — M5412 Radiculopathy, cervical region: Secondary | ICD-10-CM | POA: Diagnosis not present

## 2015-09-27 DIAGNOSIS — I10 Essential (primary) hypertension: Secondary | ICD-10-CM | POA: Diagnosis not present

## 2015-09-27 DIAGNOSIS — M542 Cervicalgia: Secondary | ICD-10-CM | POA: Diagnosis not present

## 2015-09-30 ENCOUNTER — Ambulatory Visit: Payer: Medicare Other | Admitting: Neurology

## 2015-10-06 ENCOUNTER — Ambulatory Visit
Admission: RE | Admit: 2015-10-06 | Discharge: 2015-10-06 | Disposition: A | Discharge status: Home / Self Care | Payer: Medicare Other | Source: Ambulatory Visit | Attending: Neurosurgery | Admitting: Neurosurgery

## 2015-10-06 ENCOUNTER — Other Ambulatory Visit: Payer: Self-pay | Admitting: Neurosurgery

## 2015-10-06 DIAGNOSIS — M5412 Radiculopathy, cervical region: Secondary | ICD-10-CM

## 2015-10-06 DIAGNOSIS — M4806 Spinal stenosis, lumbar region: Secondary | ICD-10-CM | POA: Diagnosis not present

## 2015-10-06 DIAGNOSIS — M47816 Spondylosis without myelopathy or radiculopathy, lumbar region: Secondary | ICD-10-CM | POA: Diagnosis not present

## 2015-10-11 DIAGNOSIS — M5416 Radiculopathy, lumbar region: Secondary | ICD-10-CM | POA: Diagnosis not present

## 2015-10-12 ENCOUNTER — Other Ambulatory Visit: Payer: Self-pay | Admitting: Neurosurgery

## 2015-10-12 DIAGNOSIS — M5416 Radiculopathy, lumbar region: Secondary | ICD-10-CM

## 2015-10-18 ENCOUNTER — Telehealth: Payer: Self-pay | Admitting: Pulmonary Disease

## 2015-10-18 NOTE — Telephone Encounter (Signed)
Attempted to call Chrys Racer back from [facility].  Office is currently closed, will call back tomorrow.

## 2015-10-19 NOTE — Telephone Encounter (Signed)
Dr. Lenna Gilford completed form stating that ok to stop Plavix for 5 days for Epidural shot, needs to stop smoking. Faxed to [facility]. Nothing further needed.

## 2015-10-19 NOTE — Telephone Encounter (Signed)
Spoke with Ana Santos at [facility]. Pt is to have an epidural injection in her lumbar spine. She will need surgical clearance before this can be done. Ana Santos is going to refax the clearance form. Pt has not been seen since 04/2015.  SN - can you do clearance without an appointment or pt need to be seen? Thanks.

## 2015-10-19 NOTE — Telephone Encounter (Signed)
Per SN >> pt does not need an appointment, when form is received I will fill out.  Form was received. It is actually for permission for pt to stop her Plavix 5 days prior to procedure. This has been placed on SN's cart per his request. He wants to review the pt's chart before he agrees to this.

## 2015-10-20 ENCOUNTER — Ambulatory Visit (INDEPENDENT_AMBULATORY_CARE_PROVIDER_SITE_OTHER): Payer: Medicare Other | Admitting: Pulmonary Disease

## 2015-10-20 ENCOUNTER — Other Ambulatory Visit (INDEPENDENT_AMBULATORY_CARE_PROVIDER_SITE_OTHER): Payer: Medicare Other

## 2015-10-20 ENCOUNTER — Encounter: Payer: Self-pay | Admitting: Pulmonary Disease

## 2015-10-20 VITALS — BP 136/70 | HR 55 | Temp 98.7°F | Ht 62.0 in | Wt 127.0 lb

## 2015-10-20 DIAGNOSIS — E785 Hyperlipidemia, unspecified: Secondary | ICD-10-CM | POA: Diagnosis not present

## 2015-10-20 DIAGNOSIS — E039 Hypothyroidism, unspecified: Secondary | ICD-10-CM

## 2015-10-20 DIAGNOSIS — R519 Headache, unspecified: Secondary | ICD-10-CM

## 2015-10-20 DIAGNOSIS — D333 Benign neoplasm of cranial nerves: Secondary | ICD-10-CM

## 2015-10-20 DIAGNOSIS — I679 Cerebrovascular disease, unspecified: Secondary | ICD-10-CM | POA: Diagnosis not present

## 2015-10-20 DIAGNOSIS — J449 Chronic obstructive pulmonary disease, unspecified: Secondary | ICD-10-CM

## 2015-10-20 DIAGNOSIS — I519 Heart disease, unspecified: Principal | ICD-10-CM

## 2015-10-20 DIAGNOSIS — I6350 Cerebral infarction due to unspecified occlusion or stenosis of unspecified cerebral artery: Secondary | ICD-10-CM

## 2015-10-20 DIAGNOSIS — F17200 Nicotine dependence, unspecified, uncomplicated: Secondary | ICD-10-CM

## 2015-10-20 DIAGNOSIS — R51 Headache: Secondary | ICD-10-CM

## 2015-10-20 DIAGNOSIS — G8929 Other chronic pain: Secondary | ICD-10-CM

## 2015-10-20 LAB — TSH: TSH: 0.48 u[IU]/mL (ref 0.35–4.50)

## 2015-10-20 MED ORDER — CLOPIDOGREL BISULFATE 75 MG PO TABS: 75.0000 mg | ORAL_TABLET | Freq: Every day | ORAL | Status: DC

## 2015-10-20 NOTE — Patient Instructions (Signed)
Today we updated your med list in our EPIC system...    Continue your current medications the same...    We refilled your Plavix per request...  Today we rechecked your Thyroid function on the SYNTHROID 78mcg/d dose...    We will contact you w/ the results when available...   Good luck w/ the pain shot sched for next week...  Call for any questions...  Let's plan a follow up visit in 30mo, sooner if needed for problems.Marland KitchenMarland Kitchen

## 2015-10-21 ENCOUNTER — Encounter: Payer: Self-pay | Admitting: Pulmonary Disease

## 2015-10-21 NOTE — Progress Notes (Signed)
Subjective:    Patient ID: Ana Santos, female    DOB: 28-Jun-1940, 75 y.o.   MRN: [id number]  HPI 75 y/o WF here for a follow up visit... she has multiple medical problems as reviewed below...   ~  SEE PREV EPIC NOTES FOR EARLIER DATA >>   LABS 6/13:  FLP- at goals on Prav40;  Chems- wnl;  CBC- wnl;  TSH=0.71;  VitD=53...  CXR 7/14> she forgot to go to the [employer] for this film...   LABS 7/14:  FLP- looks ok on Prav40;  Chems- wnl;  CBC- wnl;  TSH=2.47;  VitD=67... ADDENDUM> DrMorris ([facility], Interventional radiology) insists that we obtain her f/u CTAngiogram & send it to him, we will also obtain f/u MRI Brain to assess her acoustic neuroma...  CTA Head 9/14 showed s/p stent assisted coiling of basilar tip aneurysm, no aneurysm recurrence, patent basilar art & left post cerebral art, mild atherosclerotic calcif in cavernous carotids w/o stenosis, post op right mastoid changes w/o recurrent tumor  MRI Brain 9/14 showed no evid of resid or recurrent vestib schwannoma on the right, post surg changes w/ scarring in the int auditory canal, ols sm vessel cerebellar infarctions are stable, mild atrophy & sm vessel dis, prev coiled basilar tip aneurysm, no acute insults... ADDENDUM> Letter from DrMorris 9/14 after review of the CTA indicates that everything is stable and flow is satisfactory (he is leaving [place] can assist in the future if needed).  CXR 1/15 showed norm heart size, COPD/Emphysema w/ scarring in lingula, NAD.Marland KitchenMarland Kitchen  EKG 1/15 showed NSR, rate60, poor R prog V1-3, NSSTTWA, NAD...  2DEcho 2/15 showed normal LV size & function w/ EF=55-60%, no regional wall motion abnormalities, Gr1DD, norm valves and RV...  Holter Monitor 2/15 showed NSR, PACs, PVCs, and no pathologic arrhythmias (the skips correspond to her subjective SOB/palpit)...  ~   August 12, 2013:  add-on visit post hospital check>  Ana Santos developed diplopia & blurry vision and was adm to [facility] 2/15 -  07/30/13> review of records indicated that her neuro exam was otherw WNL, MRI Brain showed a right occipital lobe infarct, and MRA showed no new abnormality- no recurrent aneurysm, patent sent & good flow in the PCA; symptom resolved spont & they continued her same meds [id number] & [id number]); she has been able to cut down to 1cig/d and again we reviewed the critical need to quit smoking completely... CT Brain 07/27/13 [facility] showed prior basilar tip aneurysm repair, post surg changes in the right mastoid region, NAD...  MRI Brain 2/15 showed sm areas of acute infarct in right occipital lobe (new from 9/14 MRI); chr infarcts in the cerebellum bilat, basilar art stent & coiling of basilar tip aneurysm...  MRA Brain 2/15 showed no evid for recurrent basilar tip aneurysm, mild to mod narrowing of the right P1 segment, left P1 segment stented...  CDopplers 2/15 showed some plaque in the bulbs and ICAs bilat, <50% stenoses bilat noted...   EKG 2/15 showed NSR, rate63, poor R prog V1-3, NAD...  LABS 2/15 at [facility] showed> Chems- wnl w/ Cr=1.0;  CBC- wnl w/ Hg=13.5;  Abn UA w/ UTI evident...  ~  Ana Santos went to the ER 08/26/13 with another TIA characterized by diplopia & ataxia that last 22min or so; assoc mild HA, no focal neuro findings in ER; already on ASA325/ Plavix75 w/ good compliance; She denies any additional cerebral ischemic symptoms since the 3/15 ER visit; unfortunately still smoking; review of  chart shows NO documented AFib in her past...  EKG 3/15 showed NSR, rate67, poor R prog V1-3, otherw wnl...  CT Head 3/15 showed evid prior vertebral art aneurysm surg & post-op changes in right mastoid, NAD...  CTA Head & Neck 3/15 showed prev basilar tip aneurysm coiling & basilar stent, prior right mastoidectomy, vessels are patent & no recurrent aneurysm; neck showed apical scarring & emphysema, 40% left subclav stenosis, 40-50% bilat ICA stenoses, mild stenosis at the origin of the  vertebral as well...  LABS 5/15:  FLP- at goals on Prav40;  Chems & LFTs- wnl;  TSH= 0.25 on Levothy75 & rec to decr to 40mcg/d...   ~  April 17, 2014:  85mo ROV & Ana Santos is c/o HA- sharp pains in her temples, assoc w/ occas weak spells ("don't feel like getting OOB"), poor appetite, and occas falling (no known injury); she remains on ASA325 & Plavix75> she voiced discontent w/ Neuro eval at [facility], states she never received call from her doctor (Neuro- DrHusseini);  I reviewed Care Everywhere records- summary & last note 03/11/14 from DrHusseini> extensive note reviewed, pt stable on ASA/ Plavix and no changes made, she was asked to f/u prn;  Ana Santos's BP, Chol, BS- all well controlled but she is still smoking and can't vs won't quit;  She requests 2nd opinion from local [facility] due to her temporal HAs, falling, weak spells...  We reviewed the following medical problems during today's office visit >>     COPD> still smoking 1/2ppd & can't vs won't quit; not on meds, min cough/ sputum, denies hemoptysis/ SOB/ ch in DOE, etc; CXR 1/15= COPD, NAD; she MUST quit all smoking!    Palpit> new complaint 1/15> w/u in progress & asked to STOP SMOKING & avoid caffeine etc; Care Everywhere shows 30d Holter w/ PACs/ PVCs/ no AFib...    Cerebrovasc dis/ Aneurysm> on ASA325, Plavix75; she sees DrMorris IR at [facility] 8/12 w/ CTA that looked good- no resid aneurysm, distal flow appeared good & he didn't rec any changes; he requested that we do her f/u CTA & MRI Brain in [place] 9/14=> good flow thru the stented basilar art, no recurrent aneurysm, no recurrent acoustic neuroma;  Then she was [facility] at [facility] 2/15 w/ TIA & MRI/MRA Brain showed acute infarct in right occipital lobe, otherw stable;  Subsequent f/u by [facility] Neurology- no change in meds, rec quit smoking and control of BP, BS, Chol...    Hyperlipid> on Prav40; tol well & FLP 11/15 shows TChol 168, TG 129, HDL 43, LDL 99; continue same + diet...    Hypothyroid>  on Levothy50 now; labs 11/15 shows TSH= 9.91 and she is rec to increase Synthroid back to 73mcg/d...    DJD/ FM> she is managing quite well w/ prn Tramadol & Tylenol; prev CWP resolved...    Osteopenia> she had min osteopenia on BMD several yrs ago & takes Calcium, MVI, Vit D 1000u daily; f/u BMD 2/15 showed TScores -0.6 in Spine, and -1.6 in right FemNeck; rec to stay on supplements & wt bearing exercise...    RLS> on Mirapex 0.25mg  Qhs as needed...     Acoustic Neuroma> this was resected by DrBrowne [facility] 2002; MRI 2/11 continued to look good- post op changes, no recurrent tumor seen; f/u MRI here 9/14 (& [facility] 2/15) continues to look good- no evid for recurrent or resid tumor...    Anxiety> on Tranxene as needed... We reviewed prob list, meds, xrays and labs> see  below for updates >> OK Flu shot today...  LABS 11/15:  FLP- at goals on Prav40;  Chems- wnl;  CBC- wnl;  TSH=9.91;  Sed=36... PLAN>>  She desperately need to quit smoking, declines smoking cessation help;  TSH is elev on Synthroid50 & she confirms daily dosing, therefore incr back to Synthroid75;  We will refer to [facility] for 2nd opinion regarding her cerebrovasc dis, HAs, falling, weak spells...   ~  August 03, 2014:  33mo ROV & acute add-on visit requested by Neuro-DrJaffe due to CP & irreg heartbeat> Ana Santos was at [facility] office today (for f/u of headaches- improved w/ Neurontin but intol she said due to incr palpit) & mentioned left CP & palpit, he noted skips on exam & called for a work-in appt here;  She has a cardiac hx- followed for yrs by DrStuckey w/ cath 1995 showing min nonobstructive CAD & mild MVP; she developed palpit in 2015 w/ abn EKG (poor R prog, NSTTWA)) and 2DEcho showing norm LVF, no regional wall motion abn, Gr1DD, normal valves & RV; Holter monitor revealed NSR w/ some PACs & PVCs but no dangerous arrhythmias- she was asked to avoid caffeine, pseudophed, and QUIT SMOKING (she still smokes ~1/2ppd)... As  noted she has severe cerebrovasc dis & hx Acoustic Neuroma w/ prev gamma knife surg at [facility] in 2002; a 30d Event monitor at [facility] was said to reveal PACs, PVCs, but no AFib... Current symptoms sound similar to prev w/ skipping sensation, no racing, sl SOB, mild chest discomfort on & off daily x 3-4wks (sharp, not sore to touch, sl worse w/ movement); she does not exercise & her most strenuous activity is housework...    We reviewed prob list, meds, xrays and labs> see below for updates >>   CXR 2/16 showed norm heart size, linear atx vs scarring left base, otherw neg- NAD.Marland KitchenMarland Kitchen  EKG today showed NSR, rate80, occ PVCs noted, poor R prog V1=>3, NSSTTWA... PLAN>>  We decided to start METOPROLOL 25mg - 1/2 tab Bid for now & refer to Cards for f/u eval; reminded about smoking cessation but she declines additional counseling; her husb sees DrMcLean...  ~  Oct 20, 2014:  37mo ROV & Ana Santos reports improved after cardiac eval by DrArida (see below)- neg cath, neg 2DEcho, palpit improved w/ Metoprolol & decr caffeine etc; unfortunately she continues to smoke & will not quit, refuses smoking cessation meds etc... She is in the middle of bilat cat surg by DrBevis... No new complaints or concerns- denies cough, sput, hemoptysis, SOB, CP, f/c/s, etc; she states her main exercise is yard work... We reviewed prob list, meds, xrays and labs> see below for updates >>  PLAN>> she reports improved on current med regimen; we again discussed the need for smoking cessation, take meds regularly; she will f/u in 76mo w/ Fasting labs & call in the interim prn...  ~  April 22, 2015:  66mo ROV & Ana Santos has been having some increased HAs- went to see DrJaffe in Neuro w/ f/u MRI/MRA (no acute changes) and treated transiently w/ Pred taper & improved; she has Tramadol/ Tylenol to use as needed;  We reviewed the following medical problems during today's office visit >>     COPD> still smoking 5-6cig/d she says & can't vs won't quit; not on  meds, denies cough/ sputum/ hemoptysis/ SOB/ ch in DOE, etc; CXR 2/16= COPD, mild basilar atx, NAD; Spirometry shows mild obstructive dis- must quit all smoking!     Palpit> this  was a new complaint 1/15> w/u DrArida was neg (2DEcho, Cath) & symptoms improved w/ Metoprolol- asked to STOP SMOKING & avoid caffeine etc; Care Everywhere shows 30d Holter w/ PACs/ PVCs/ no AFib...    Cerebrovasc dis/ Aneurysm> on ASA325, Plavix75; she sees DrMorris IR at [facility] 8/12 w/ CTA that looked good- no resid aneurysm, distal flow appeared good & he didn't rec any changes; he requested that we do her f/u CTA & MRI Brain in [place] 9/14=> good flow thru the stented basilar art, no recurrent aneurysm, no recurrent acoustic neuroma;  Then she was [facility] at [facility] 2/15 w/ TIA & MRI/MRA Brain showed acute infarct in right occipital lobe, otherw stable;  Subsequent f/u by [facility] Neurology- no change in meds, rec quit smoking and control of BP, BS, Chol...    Hyperlipid> on Prav40; tol well & FLP 11/16 shows TChol 140, TG 112, HDL 43, LDL 75; continue same + diet...    Hypothyroid> on Levothy75 now; labs 11/15 shows TSH= 9.91 and she was rec to increase Synthroid back to 11mcg/d; Labs 11/16 showed TSH=0.22, continue same for now...    DJD/ FM> she is managing quite well w/ prn Tramadol & Tylenol; prev CWP resolved...    Osteopenia> she had min osteopenia on BMD several yrs ago & takes Calcium, MVI, Vit D 1000u daily; f/u BMD 2/15 showed TScores -0.6 in Spine, and -1.6 in right FemNeck; rec to stay on supplements & wt bearing exercise...    RLS> on Mirapex 0.25mg  Qhs as needed...     Acoustic Neuroma> resected by DrBrowne [facility] 2002; MRI 2/11 continued to look good- post op changes, no recurrent tumor seen; f/u MRI here 9/14 (& [facility] 2/15) continues to look good- no evid for recurrent or resid tumor.    Headaches> s/p eval by Neuro, DrJaffe- 9/16 note reviewed, MRI w/o acute changes, Cspine spondy noted w/ some spinal stenosis,  treated w/ Pred taper & improved...    Anxiety> on Tranxene as needed... We reviewed prob list, meds, xrays and labs> OK 2016 FLU vaccine today...  MRI/MRA 02/22/15>  No acute infarct, tiny cbll blood breakdown products unchanged w/o new areas of intracranial hemorrhage detected, remote right cbll infarcts & sm vessel dis, postop right vestib schwannoma surg- no acute changes, cerv spondylosis w/ sp stenosis C3-4 & C4-5, s/p coiling basilar tip aneurysm, mild irregularity of basilar art, mod narrowing of P1 segm left p[ost cerebral art (mild narrowing on right)...   Spirometry 04/22/15>  FVC=2.46 (97%), FEV1=1.69 (88%), %1sec=69%, mid-flows reduced at 64% predicted;  This is c/w mild airflow obstruction and GOLD Stage 1 COPD  LABS 04/22/15>  FLP- at goals on Prav40;  Chems- wnl;  CBC- wnl;  TSH= 0.22 on Synth75;  VitD= 48 on 1000u/d... IMP/PLAN>>  Ana Santos is stable overall w/ her mult serious medical issues;  We again discussed smoking cessation;  She continues to do alterations as she has done for 50+yrs;  We refilled her Tramadol & Tranxene...  ~  Oct 20, 2015:  45mo ROV & Ana Santos has been stable over the interval, c/o incr pain/ discomfort in neck & back (sched for ?neck injections next week, DrJaffe, DrBotero);  She is still smoking but now down to 2cig/d she says, breathing is OK 7 she denies cough/ sput/ hemoptysis/ SOB/ or CP;  She has been walking some & doing yard work- says she is NOT limited by her breathing & she has declined breathing meds in the past... We reviewed the  following medical problems during today's office visit >>     COPD> still smoking 1-2cig/d she says & can't vs won't quit; not on meds, denies cough/ sputum/ hemoptysis/ SOB/ ch in DOE, etc; CXR 2/16= COPD, mild basilar atx, NAD; Spirometry shows mild obstructive dis- must quit all smoking!     Palpit> this was a new complaint 1/15> w/u DrArida was neg (2DEcho, Cath) & symptoms improved w/ Metoprolol- asked to STOP SMOKING &  avoid caffeine etc; Care Everywhere shows 30d Holter w/ PACs/ PVCs/ no AFib...    Cerebrovasc dis/ Aneurysm> on ASA325, Plavix75; she sees DrMorris IR at [facility] 8/12 w/ CTA that looked good- no resid aneurysm, distal flow appeared good & he didn't rec any changes; he requested that we do her f/u CTA & MRI Brain in [place] 9/14=> good flow thru the stented basilar art, no recurrent aneurysm, no recurrent acoustic neuroma;  Then she was [facility] at [facility] 2/15 w/ TIA & MRI/MRA Brain showed acute infarct in right occipital lobe, otherw stable;  Subsequent f/u by [facility] Neurology- no change in meds, rec quit smoking and control of BP, BS, Chol...    Hyperlipid> on Prav40; tol well & FLP 11/16 shows TChol 140, TG 112, HDL 43, LDL 75; continue same + diet...    Hypothyroid> on Levothy75 now; labs 11/15 shows TSH= 9.91 and she was rec to increase Synthroid back to 68mcg/d; Labs 11/16 showed TSH=0.22, continue same for now...    DJD/ FM> she is managing quite well w/ prn Tramadol & Tylenol; prev CWP resolved...    Osteopenia> she had min osteopenia on BMD several yrs ago & takes Calcium, MVI, Vit D 1000u daily; f/u BMD 2/15 showed TScores -0.6 in Spine, and -1.6 in right FemNeck; rec to stay on supplements & wt bearing exercise...    RLS> on Mirapex 0.25mg  Qhs as needed...     Acoustic Neuroma> resected by DrBrowne [facility] 2002; MRI 2/11 continued to look good- post op changes, no recurrent tumor seen; f/u MRI here 9/14 (& [facility] 2/15) continues to look good- no evid for recurrent or resid tumor.    Headaches> s/p eval by Neuro, DrJaffe- 9/16 note reviewed, MRI w/o acute changes, Cspine spondy noted w/ some spinal stenosis, treated w/ Pred taper & improved...    Anxiety> on Tranxene as needed... EXAM shows Afeb, VSS, O2sat=100% on RA;  HEENT- neg, mallampati1;  Chest- clear w/o w/r/r;  Heart- RR w/o m/r/g;  Abd- soft, nontender, neg;  Ext- VI w/o c/c/e;  Neuro- no focal neuro deficits...  LAB 10/20/15:  TSH=0.48 on  Synthroid 49mcg/d- continue same... IMP/PLAN>>  Mult medical issues as noted; Ana Santos is still smoking although she has cut down to 1-2cig/d she is reminded of the need to quit completely; REC to continue same meds regularly; she is sched for shots in her back next week... We plan ROV w/ CXR & fasting labs in 61mo.          Problem List:   COPD (ICD-496) - she has a min smoker's cough, sm amt beige sputum... ~  CXR 5/08 w/ chr changes, LLL scar, NAD. ~  CXR 7/09 showed norm hrt size, clear lungs, NAD. ~  CXR 9/10 showed NAD. ~  CXR 10/11 showed chr changes, NAD. ~  CXR 12/12 showed COPD, NAD. ~  CXR 4/13 showed normal heart size, incr interstitial markings, NAD. ~  CXR 1/15 showed norm heart size, COPD/Emphysema w/ scarring in lingula, NAD. ~  CXR 2/16  showed norm heart size, linear atx vs scarring left base, otherw neg- NAD ~  Spirometry 04/22/15>  FVC=2.46 (97%), FEV1=1.69 (88%), %1sec=69%, mid-flows reduced at 64% predicted;  This is c/w mild airflow obstruction and GOLD Stage 1 COPD  CIGARETTE SMOKER (ICD-305.1) - can't vs won't quit and not interested in smoking cessation help... now she states she's decr to ~1/4-1/2 ppd but can't seem to improve from there... ~  She understands the critical need to quit smoking from the COPD/Pulm, Cardiac, & Vascular/Stroke standpoints;  She declines offers for smoking cessation help, chantix, nicotine replacement, etc... ~  2/15:  She was hosp at [facility] w/ Diplopia & found to have a right occipital stroke=> she has decr smoking to <1cig/d & encouraged to quit completely!!! ~  5/15 to 11/15:  She is still smoking 3-4cig/d she says & I have begged her to quit, discussed nicotine replacement rx & alternatives... ~  2/16: she is still smoking <1/2ppd she says but again declines smoking cessation help... ~  5/17: she has decr to 1-2 cig/d she says, reminded of the need to quit completely!  ?MITRAL VALVE PROLAPSE (ICD-424.0) >> mild MVP seen on cath  1995 by DrStuckey... Hx of CHEST PAIN (ICD-786.50) >> she had CP in the past and a neg cath 1995 (min coronary irregularities)...  Hx PALPITATIONS >> PVCs and PACs seen on EKG & Holter monitor in 2015; asked to stop all caffeine & nicotine... ~  CP eval 1995 w/ cath showing min 10% luminal irreg RCA & mild MVP, norm LVF... ~  [place] was neg- no ischemia, no infarct, EF=66%... ~  baseline EKG showed NSR, NSSTTWA (poor R prog V1-V3)... ~  EKG 1/15 showed NSR, rate60, poor R prog V1-3, NSSTTWA, NAD... ~  2DEcho 2/15 showed normal LV size & function w/ EF=55-60%, no regional wall motion abnormalities, Gr1DD, norm valves and RV ~  Holter Monitor 2/15 showed NSR, PACs, PVCs, and no pathologic arrhythmias (the skips correspond to her subjective SOB/palpit)...  ~  She had another 30d Holter per [facility] Neurology w/ Care Everywhere report indicating PACs & PVCs, no AFib... ~  EKG 2/16 showed NSR, rate80, PVCs noted, poor R prog V1=>3, NSSTTWA... She noted incr palpit & CP => referred to Cards for their review... ~  Seen by DrArida 2/16> Cath showed minor luminal irregularities w/o obstructive CAD, norm LVF w/ EF=55%, no signif MR; symptoms felt to be due to PVCs & Rx w/ Metoprolol improved her symptoms...  ~  2DEcho 3/16 showed norm LV size & function w/ EF=65-70%, no regional wall motion abn, Gr1DD, norm valves, norm RV & PAsys...  CEREBROVASCULAR DISEASE (ICD-437.9), & ANEURYSM (ICD-442.9) - found to have a 4-64mm basilar tip aneurysm on MRI Br 10/10... referred to [facility] w/ Angiogram confirmation & subseq stent assisted coiling of the aneurysm 11/10... subseq f/u angiogram 2/11 showed complete angiographic obliteration of the aneurysm, & patent stent in distal basilar art (there was a new 50%stenosis in the right PCA)... on ASA 325mg /d & PLAVIX restarted 2/11 w/ new gait abn & cbll infarct on MRI. ~  she saw DrMorris [facility]- interventional neuroradiology- w/ another arteriogram performed 8/11- it  showed satis appearance of the basilar aneurysm stent & coiling w/ some prolapse of the coils into the P1 segm w/ mild compromise of the post cerebral art (flow looks adeq & he rec continued ASA/ Plavix therapy & f/u 55yr). ~  She had f/u DrMorris 8/12 w/ CTA that looked good- no resid aneurysm,  distal flow appeared good & he didn't rec any changes- f/u planned 37yrs. ~  7/14: she is due for her f/u CTA & we will call [facility] regarding a follow up appt w/ DrMorris.. ~  9/14:  on [id number], Plavix75; last f/u DrMorris IR at [facility] 8/12 w/ CTA that looked good- no resid aneurysm, distal flow appeared good & he didn't rec any changes; he requested that we do her f/u CTA & MRI Brain here in [place] 9/14=> good flow thru the stented basilar art, no recurrent aneurysm, no recurrent acoustic neuroma...  ~  CTA Head 9/14 showed s/p stent assisted coiling of basilar tip aneurysm, no aneurysm recurrence, patent basilar art & left post cerebral art, mild atherosclerotic calcif in cavernous carotids w/o stenosis, post op right mastoid changes w/o recurrent tumor. ~  2/15: Adm to [facility] w/ Dip[lopia & eval revealed right occipital lobe infarct, everything else was stable, continued on her ASA325/ Plavix75=> we will refer to [facility] Neurology for their review of her situation... ~  3/15: she had a busy March> f/u w/ Neuro in [place] then had another TIA & presented to the ER; CTA Head & Neck showed similar to prev, no recurrent aneurysm, & they disch her on same ASA325 + Plavix75... ~  9/15: she had f/u DrHusseini at [facility], note reviewed on Care Everywhere- felt to be stable & no changes made> rec to quit smoking & control BP, BS, Chol; they released her to f/u prn... ~  11/15: pt upset that Neuro didn't address her c/o temporal HAs, falling, weak spells- we will refer to [place] [facility] per her request...  ~  2/16: pt seen by DrJaffe> Hx basilar tip aneurysm s/p coiling 2010, right cbll infarct, right vestib schwannoma s/p surg 2002,  recurrent TIA, & HAs; they reied Gabapentin=> HA improved but she developed CP & thought it was from this med therefore stopped it; she was advised to try MelatoninQhs & limit her Tramadol rx, f/u 27mo...  VENOUS INSUFFICIENCY (ICD-459.81) - she tells me that she saw DrKrush for vein surgery and treatment ("it's covered by my husb's insurance").  HYPERLIPIDEMIA (ICD-272.4) - now on PRAVASTATIN 40mg /d & tol well... we reviewed low chol/ low fat diet. ~  chart review shows TChol ~ 200 range in the 80's and early 90's. ~  then TChol incr to ~250 range in late 90's and Lipitor started-  ~  FLP 5/08 on Lipitor 10mg /d showed TChol 164, TG 153, HDL 45, LDL 89. ~  in 2009 c/o no energy & arm discomfort she felt was from the Lip10- therefore [place]. ~  [place] 7/09 on diet alone showed TChol 234, TG 218, HDL 40, LDL 139... rec- start Prav40. ~  FLP 3/10 on Prav40 showed TChol 182, Tg 137, HDL 47, LDL 107 ~  FLP 6/11 on Prav40 showed TChol 162, TG 230, HDL 37, LDL 88 ~  FLP 6/12 on Prav40 showed TChol 177, TG 139, HDL 44, LDL 105... Contin diet, take med every day. ~  FLP 6/13 on Prav40 showed TChol 151, TG 138, HDL 42, LDL 81  ~  FLP 7/14 on Prav40 showed TChol 174, TG 68, HDL 58, LDL 103 ~  FLP 5/15 on Prav40 showed TChol 164, TG 120, HDL 49, LDL 91 ~  FLP 11/15 on Prav40 showed TChol 168, TG 129, HDL 43, LDL 99  ~  FLP 11/16 on Prav40 showed TChol 140, TG 112, HDL 43, LDL 75  HYPOTHYROIDISM (ICD-244.9) - stable on LEVOTHYROID 6mcg/d  now... hx Hashimoto's thyroiditis in 1989 w/ markedly pos antimicrosomal antibodies and transient hyperthy labs...  ~  labs 5/08 showed TSH= 0.53... rec- continue Levo100/d. ~  labs 7/09 showed TSH= 0.07... rec- decr Levothy100 to 1/2 daily. ~  labs 3/10 showed TSH= 17.71... rec> incr back to 176mcg/d. ~  labs 9/10 showed TSH= 0.21... continue same. ~  labs 6/11 showed TSH= 0.10... rec decr Levo100 to 1/2 daily. ~  labs 10/11 on Levo50 showed TSH= 1.25 (FreeT3 & FreeT4  normal) ~  Labs 6/12 on Levo50 showed TSH= 12.56 ==> we will contact pt & pharm to determine if taking Levo100-1/2tab daily every day? & adjust. ~  Labs 12/12 on Levo75 showed TSH= 0.88... Continue same. ~  Labs 6/13 on Levo75 showed TSH= 0.71... Continue same. ~  Labs 7/14 on Levo75 showed TSH= 2.47 ~  Labs 5/15 on Levo75 showed TSH= 0.25 and we rec decr to Levothy50... ~  Labs 11/15 on Levo50 showed TSH= 9.91 and she is rec to go back on Synthroid75 ~  Labs 11/16 on Levo75 showed TSH=0.22 and rec to continue same for now... ~  Labs 5/17 on Levo75 showed TSH= 0.48  IRRITABLE BOWEL SYNDROME (ICD-564.1) - had colonoscopy by [facility] 11/06 that was WNL.Marland Kitchen. she notes some constipation & Rx w/ colase/ senakot-S OTC...  NEPHROLITHIASIS (ICD-592.0) - last stone passed in 1993, no known recurrence since then...  Hx of TENDINITIS, RIGHT ELBOW (ICD-727.09) Hx right shoulder pain w/ eval by DrNorris (MRI was planned butnever done & symptoms improved spont)... FIBROMYALGIA (ICD-729.1) - prev severe symptoms may have reflected a flair in her FM> but much improved on Pred Rx. ~  labs 9/10 showed Sed= 24 ~  labs 6/11 showed Sed= 30 ~  labs 10/11 showed CBC=norm, Chems=norm, TFTs=norm, Sed=21, RF=neg, ANA+1:40 speckled... ~  12/11:  pt saw DrTruslow & his note is pending- she indicates he stopped her Pred, & gave her shot in shoulder.  OSTEOPENIA (ICD-733.90) - BMD here 7/09 showed TScores -0.8 in Spine, and -1.2 in right FemNeck... rec to take Calcium, MVI, Vit D... ~  BMD repeated 2/15 & showed TScores -0.6 in Spine, and -1.6 in right FemNeck; rec to stay on Calcium, MVI, VitD, & wt bearing exercise...  VITAMIN D DEFICIENCY (ICD-268.9) ~  labs 7/09 showed Vit D level = 16... rec- start Vit D 50K weekly. ~  labs 3/10 showed Vit D level = 69... rec> change to 1000 u daily. ~  labs 6/11 showed Vit D level = 55... Continue same. ~  Labs 6/13 showed Vit D level = 53... Continue same. 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 no known recurrence... ~  MRI Brain 2/07 showed s/p translabyrinthine approach to right acoustic neuroma w/o recurrence, NAD... 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 ~  MRI Brain 2/11 showed no recurrence/ post op changes, endovasc oblit of basilar aneurysm, cerebellar lacunar infarct & 50% stenosis of right PCA... PLAVIX restarted... she had review by [facility] IR & Neurology (as above)... ~  We discussed f/u MRI since DrBrowne hasn't seen her since 2011 (?he turned her over to DrMorris)... ~  9/14:  MRI Brain showed no evid of resid or recurrent vestib schwannoma on the right, post surg changes w/ scarring in the int auditory canal, ols sm vessel cerebellar infarctions  are stable, mild atrophy & sm vessel dis, prev coiled basilar tip aneurysm, no acute insults...  ~  MRI/ MRA Brain 2/15 at [facility] showed acute infarct in right occipital lobe, chronic infarcts in cerebellum bilat, aneurysm coiling of basilar art tip and stent in basilar art.. ~  CT Angio Head & Neck 3/15 in [place] showed 40% narrowing of prox left subclavian art; 40% RICAstenosis & [id number] LICAstenosis; aneurysm coiling & stenting of basilar tip aneurysm...  RLS symptoms >> she complained 1/14 about new onset RLS symptoms- discussed trial Mirapex 0.25mg  prn...  ANXIETY (ICD-300.00) - uses CHLORAZEPATE 7.5mg Tid... she gets claustrophobic and needs sedation before MRIs.   Past Surgical History  Procedure Laterality Date  . Translabyrinthine procedure  2002    [facility] Dr. Vicie Mutters  . Total abdominal hysterectomy    . Breast lumpectomy    . Aneurysm coiling  11-10    Dr. Estanislado Pandy  . Left heart catheterization with coronary angiogram N/A 08/05/2014    Procedure: LEFT HEART CATHETERIZATION WITH CORONARY ANGIOGRAM;  Surgeon: Wellington Hampshire, MD;  Location: [place] CATH  LAB;  Service: Cardiovascular;  Laterality: N/A;    Outpatient Encounter Prescriptions as of 10/20/2015  Medication Sig  . acetaminophen (TYLENOL) 500 MG tablet Take 500 mg by mouth every 6 (six) hours as needed for mild pain.   Marland Kitchen aspirin 325 MG tablet Take 325 mg by mouth daily.    . calcium gluconate 500 MG tablet Take 1 tablet by mouth daily.  . cholecalciferol (VITAMIN D) 1000 UNITS tablet Take 1,000 Units by mouth daily.    . clopidogrel (PLAVIX) 75 MG tablet Take 1 tablet (75 mg total) by mouth daily.  . clorazepate (TRANXENE) 7.5 MG tablet Take 1 tablet (7.5 mg total) by mouth 3 (three) times daily as needed for anxiety.  . gabapentin (NEURONTIN) 300 MG capsule Increase to 1 capsule twice daily for 7 days, then 1 capsule in morning and 2 capsules at bedtime.  . hydroxypropyl methylcellulose / hypromellose (ISOPTO TEARS / GONIOVISC) 2.5 % ophthalmic solution Place 1 drop into both eyes as needed for dry eyes.  Marland Kitchen levothyroxine (SYNTHROID, LEVOTHROID) 75 MCG tablet TAKE 1 TABLET DAILY BEFORE BREAKFAST  . metoprolol tartrate (LOPRESSOR) 25 MG tablet TAKE 1 TABLET TWICE A DAY  . pramipexole (MIRAPEX) 0.25 MG tablet TAKE 1 TABLET AT BEDTIME FOR RESTLESS LEG SYMPTOMS  . pravastatin (PRAVACHOL) 40 MG tablet TAKE 1 TABLET DAILY  . senna-docusate (SENOKOT-S) 8.6-50 MG per tablet Take 1 tablet by mouth at bedtime.   . traMADol (ULTRAM) 50 MG tablet Take 1 tablet (50 mg total) by mouth 3 (three) times daily as needed for moderate pain.  . [DISCONTINUED] clopidogrel (PLAVIX) 75 MG tablet TAKE 1 TABLET DAILY  . diazepam (VALIUM) 5 MG tablet Take 1 tablet (5 mg total) by mouth once. Take 20 - 30 minutes before scan. (Patient not taking: Reported on 10/20/2015)  . tiZANidine (ZANAFLEX) 2 MG tablet Take 1tab at bedtime for 7 days, then 1 tab twice daily.  Call for refills. (Patient not taking: Reported on 10/20/2015)   No facility-administered encounter medications on file as of 10/20/2015.    Allergies   Allergen Reactions  . Atorvastatin Other (See Comments)    REACTION: INTOL to Lipitor w/ arm pain (3/09)  . Hydrocodone-Acetaminophen Other (See Comments)    REACTION: hallucinations  . Morphine Other (See Comments)    REACTION: hallucinations  . Topamax [Topiramate] Nausea And Vomiting    *dizziness*    Current  Medications, Allergies, Past Medical History, Past Surgical History, Family History, and Social History were reviewed in [employer] record.   Review of Systems         See HPI - all other systems neg except as noted... The patient complains of dyspnea on exertion, recent palpitations & HAs.  The patient denies anorexia, fever, weight loss, weight gain, vision loss, decreased hearing, hoarseness, chest pain, syncope, peripheral edema, prolonged cough, hemoptysis, abdominal pain, melena, hematochezia, severe indigestion/heartburn, hematuria, incontinence, muscle weakness, suspicious skin lesions, transient blindness, difficulty walking, depression, unusual weight change, abnormal bleeding, enlarged lymph nodes, and angioedema.     Objective:   Physical Exam     WD, WN, 75 y/o WF in NAD... GENERAL:  Alert & oriented; pleasant & cooperative... HEENT:  [place]/AT, EOM-wnl, PERRLA, EACs-clear, TMs- some scarring on right, NOSE-clear, THROAT-clear & wnl. NECK:  Supple w/ fairROM; no JVD; normal carotid impulses w/o bruits; palp thyroid, no nodules felt; no lymphadenopathy. CHEST:  Clear to P & A; without wheezes/ rales/ or rhonchi heard... HEART:  Regular Rhythm; gr1/6 SEM,  no irregularity, rubs or gallops heard... ABDOMEN:  Soft & nontender; normal bowel sounds; no organomegaly or masses detected. EXT: without deformities, mild arthritic changes; no varicose veins/ venous insuffic/ or edema... +trigger points about the neck/ shoulders w/ decr ROM right shoulder w/ pain... NEURO:  CN's intact; no focal neuro deficits... gait & station are normal. DERM:  No  lesions noted; no rash etc...  RADIOLOGY DATA:  Reviewed in the EPIC EMR & discussed w/ the patient...  LABORATORY DATA:  Reviewed in the EPIC EMR & discussed w/ the patient...   Assessment & Plan:    COPD/ Smoker>  She notes min cough/ sputum/ etc; desperately needs to quit smokng- offered counseling, medications, etc but she declines...  Hx mild MVP>  No MVP seen on 2DEcho 2/15, mild MVP was noted on cath 1995 by DrStuckey...  PALPITATIONS>> Prev eval w/ CXR, EKG, 2DEcho & Holter monitor=> all studies OK/NEG as above (x PACs/ PVCs); we stressed the importance of no nicotine, no caffeine, etc... 2/16> she presented w/ incr symptoms over the last month; rec to start Metop25- 1/2Bid & refer to Cards for their review... 2/16> seen by DrArida w/ Cath, repeat 2DEcho- both neg & palpit/ PVCs improved w/ Metoprolol rx...  Hx right sided Acoustic Neuroma- s/p surg 2002 at [facility] DrBrowne> no known recurrence to date including MRI 9/14...  CEREBROVASC Dis w/ basilar tip aneurysm stent & coiling 11/10 by DrDeveshwar; f/u arteriogram 2/11 at [facility] & 8/11 at [facility] showed some compromise of the post circ w/ prolapse of the coils into the P1 segm;  Note: MRI 2/11 done for new gait abn showed cbll infarct & ASA/ Plavix restarted and she did home phys therapy w/ improvement;  Repeat IR eval 8/12 w/ CTA showed oblit of the aneurysm & distal flow appears good as well- they rec repeat studies in 2 yrs=> Done 9/14 in [place] & no aneurysm seen, stent ok w/ good flow thru the area, felt to be stable... Right Occipital Lobe Infarct 2/15>  As above, she remains on ASA325 & Plavix75, we will refer to [facility] Neurology division for their recommendations... Another TIA 3/15>  eval via ER w/ CTA Head & Neck- stable, no changes made to meds- rec to continue ASA/ Plavix, AND QUIT SMOKING.... She was released by [facility] Neuro- DrHusseini & asked to quit smoking, continue ASA/Plavix, keep BP/ BS/ Chol under control... 11/15> pt  c/o HAs,  falls, weakness & requests Neuro 2nd opinion at [facility]... 2/16> seen by [facility] for HAs & neuro 2nd opinion...  HYPERLIPID>  On Prav40 + diet;  FLP looks OK but needs better diet & take med Qhs...  HYPOTHYROID>  Back on Levothy 68mcg/d now w/ TSH= 9.91 last OV on 30mcg/d...  IBS w/ Constip>  She uses OTC laxatives as needed & we discussed Miralax/ Senakot-S/ etc...  ORTHO>  Prev right shoulder discomfort improved spont & MRI was never done; currently uses Tramadol Prn... ~  2017> c/o neck & back pain; DrJaffe sent her to [facility] & he has sched back injections...  OSTEOPENIA/ Vit D defic>  Stable on Calcium, MVI, Vit D supplement; due for f/u BMD=> pending...  RLS symptoms>  trial Mirapex 0.25mg  hs prn...  Hx Chronic daily HAs>>  HAs improved spontaneously- on Tramadol & Tylenol w/ some relief of pain; she tried Topamax but intol w/ dizzy & nausea... 11/15> presented w/ incr HAs and referred to Neuro; they tried Neurontin w/ decr HAs but intol she says...  ANXIETY>  Uses Tranxene Prn (she has some claustrophobia & needs sedation before MRIs- she doesn't remember the 2/11 MRI being done!).Marland KitchenMarland Kitchen   Patient's Medications  New Prescriptions   No medications on file  Previous Medications   ACETAMINOPHEN (TYLENOL) 500 MG TABLET    Take 500 mg by mouth every 6 (six) hours as needed for mild pain.    ASPIRIN 325 MG TABLET    Take 325 mg by mouth daily.     CALCIUM GLUCONATE 500 MG TABLET    Take 1 tablet by mouth daily.   CHOLECALCIFEROL (VITAMIN D) 1000 UNITS TABLET    Take 1,000 Units by mouth daily.     CLORAZEPATE (TRANXENE) 7.5 MG TABLET    Take 1 tablet (7.5 mg total) by mouth 3 (three) times daily as needed for anxiety.   DIAZEPAM (VALIUM) 5 MG TABLET    Take 1 tablet (5 mg total) by mouth once. Take 20 - 30 minutes before scan.   GABAPENTIN (NEURONTIN) 300 MG CAPSULE    Increase to 1 capsule twice daily for 7 days, then 1 capsule in morning and 2 capsules at bedtime.   HYDROXYPROPYL  METHYLCELLULOSE / HYPROMELLOSE (ISOPTO TEARS / GONIOVISC) 2.5 % OPHTHALMIC SOLUTION    Place 1 drop into both eyes as needed for dry eyes.   LEVOTHYROXINE (SYNTHROID, LEVOTHROID) 75 MCG TABLET    TAKE 1 TABLET DAILY BEFORE BREAKFAST   METOPROLOL TARTRATE (LOPRESSOR) 25 MG TABLET    TAKE 1 TABLET TWICE A DAY   PRAMIPEXOLE (MIRAPEX) 0.25 MG TABLET    TAKE 1 TABLET AT BEDTIME FOR RESTLESS LEG SYMPTOMS   PRAVASTATIN (PRAVACHOL) 40 MG TABLET    TAKE 1 TABLET DAILY   SENNA-DOCUSATE (SENOKOT-S) 8.6-50 MG PER TABLET    Take 1 tablet by mouth at bedtime.    TIZANIDINE (ZANAFLEX) 2 MG TABLET    Take 1tab at bedtime for 7 days, then 1 tab twice daily.  Call for refills.   TRAMADOL (ULTRAM) 50 MG TABLET    Take 1 tablet (50 mg total) by mouth 3 (three) times daily as needed for moderate pain.  Modified Medications   Modified Medication Previous Medication   CLOPIDOGREL (PLAVIX) 75 MG TABLET clopidogrel (PLAVIX) 75 MG tablet      Take 1 tablet (75 mg total) by mouth daily.    TAKE 1 TABLET DAILY  Discontinued Medications   No medications on file

## 2015-10-26 ENCOUNTER — Other Ambulatory Visit: Payer: Self-pay | Admitting: Neurosurgery

## 2015-10-26 ENCOUNTER — Ambulatory Visit
Admission: RE | Admit: 2015-10-26 | Discharge: 2015-10-26 | Disposition: A | Discharge status: Home / Self Care | Payer: Medicare Other | Source: Ambulatory Visit | Attending: Neurosurgery | Admitting: Neurosurgery

## 2015-10-26 DIAGNOSIS — M5416 Radiculopathy, lumbar region: Secondary | ICD-10-CM

## 2015-10-26 MED ORDER — METHYLPREDNISOLONE ACETATE 40 MG/ML INJ SUSP (RADIOLOG
120.0000 mg | Freq: Once | INTRAMUSCULAR | Status: AC
  Administered 2015-10-26: 120 mg via EPIDURAL

## 2015-10-26 MED ORDER — IOPAMIDOL (ISOVUE-M 200) INJECTION 41%
1.0000 mL | Freq: Once | INTRAMUSCULAR | Status: AC
  Administered 2015-10-26: 1 mL via EPIDURAL

## 2015-10-26 NOTE — Discharge Instructions (Signed)

## 2015-10-26 NOTE — Progress Notes (Signed)
Quick Note:  Attempted to call pt. Unable to LVM as VM was full. ______

## 2015-10-29 NOTE — Progress Notes (Signed)
Quick Note:  Called spoke with patient, advised of lab results / recs as stated by SN. Pt verbalized her understanding and denied any questions. ______

## 2015-11-18 ENCOUNTER — Other Ambulatory Visit: Payer: Self-pay | Admitting: Neurology

## 2015-11-18 NOTE — Telephone Encounter (Signed)
1. Titrate gabapentin to 300mg  in AM and 600mg  at night  12/28/15

## 2015-12-05 ENCOUNTER — Other Ambulatory Visit: Payer: Self-pay | Admitting: Pulmonary Disease

## 2015-12-17 ENCOUNTER — Emergency Department (HOSPITAL_COMMUNITY)
Admission: EM | Admit: 2015-12-17 | Discharge: 2015-12-17 | Disposition: A | Discharge status: Home / Self Care | Payer: Medicare Other | Attending: Emergency Medicine | Admitting: Emergency Medicine

## 2015-12-17 ENCOUNTER — Encounter (HOSPITAL_COMMUNITY): Payer: Self-pay | Admitting: Emergency Medicine

## 2015-12-17 ENCOUNTER — Telehealth: Payer: Self-pay | Admitting: Pulmonary Disease

## 2015-12-17 ENCOUNTER — Emergency Department (HOSPITAL_COMMUNITY): Payer: Medicare Other

## 2015-12-17 DIAGNOSIS — I2510 Atherosclerotic heart disease of native coronary artery without angina pectoris: Secondary | ICD-10-CM | POA: Diagnosis not present

## 2015-12-17 DIAGNOSIS — Z8673 Personal history of transient ischemic attack (TIA), and cerebral infarction without residual deficits: Secondary | ICD-10-CM | POA: Diagnosis not present

## 2015-12-17 DIAGNOSIS — E785 Hyperlipidemia, unspecified: Secondary | ICD-10-CM | POA: Insufficient documentation

## 2015-12-17 DIAGNOSIS — J40 Bronchitis, not specified as acute or chronic: Secondary | ICD-10-CM

## 2015-12-17 DIAGNOSIS — Z7982 Long term (current) use of aspirin: Secondary | ICD-10-CM | POA: Insufficient documentation

## 2015-12-17 DIAGNOSIS — R05 Cough: Secondary | ICD-10-CM | POA: Diagnosis not present

## 2015-12-17 DIAGNOSIS — Z79899 Other long term (current) drug therapy: Secondary | ICD-10-CM | POA: Diagnosis not present

## 2015-12-17 DIAGNOSIS — F17210 Nicotine dependence, cigarettes, uncomplicated: Secondary | ICD-10-CM | POA: Insufficient documentation

## 2015-12-17 LAB — COMPREHENSIVE METABOLIC PANEL
ALK PHOS: 105 U/L (ref 38–126)
ALT: 12 U/L — AB (ref 14–54)
AST: 20 U/L (ref 15–41)
Albumin: 3.8 g/dL (ref 3.5–5.0)
Anion gap: 8 (ref 5–15)
BILIRUBIN TOTAL: 0.8 mg/dL (ref 0.3–1.2)
BUN: 11 mg/dL (ref 6–20)
CALCIUM: 9.6 mg/dL (ref 8.9–10.3)
CO2: 24 mmol/L (ref 22–32)
CREATININE: 0.71 mg/dL (ref 0.44–1.00)
Chloride: 110 mmol/L (ref 101–111)
GFR calc non Af Amer: >60 mL/min (ref >60)
Glucose, Bld: 95 mg/dL (ref 65–99)
Potassium: 3.3 mmol/L — ABNORMAL LOW (ref 3.5–5.1)
SODIUM: 142 mmol/L (ref 135–145)
TOTAL PROTEIN: 7.4 g/dL (ref 6.5–8.1)

## 2015-12-17 LAB — CBC
HCT: 44.3 % (ref 36.0–46.0)
Hemoglobin: 14.1 g/dL (ref 12.0–15.0)
MCH: 28.8 pg (ref 26.0–34.0)
MCHC: 31.8 g/dL (ref 30.0–36.0)
MCV: 90.6 fL (ref 78.0–100.0)
PLATELETS: 297 10*3/uL (ref 150–400)
RBC: 4.89 MIL/uL (ref 3.87–5.11)
RDW: 13.2 % (ref 11.5–15.5)
WBC: 10.9 10*3/uL — ABNORMAL HIGH (ref 4.0–10.5)

## 2015-12-17 LAB — I-STAT CG4 LACTIC ACID, ED: LACTIC ACID, VENOUS: 1.45 mmol/L (ref 0.5–1.9)

## 2015-12-17 LAB — LIPASE, BLOOD: Lipase: 23 U/L (ref 11–51)

## 2015-12-17 MED ORDER — LEVOFLOXACIN 250 MG PO TABS: 250.0000 mg | ORAL_TABLET | Freq: Every day | ORAL | Status: DC

## 2015-12-17 NOTE — ED Notes (Signed)
Pt sts nausea and vomiting and generalized weakness x 3 days; pt sts productive cough with green sputum

## 2015-12-17 NOTE — Telephone Encounter (Signed)
Spoke with SN regarding pt's symptoms and complaints. He recommends pt proceed to ED immediately for evaluation. Spoke with pt and gave recommendations. She will have her husband take her to the ED. Nothing further needed at this time.

## 2015-12-17 NOTE — ED Provider Notes (Signed)
CSN: [id number]     Arrival date & time 12/17/15  1145 History   First MD Initiated Contact with Patient 12/17/15 1529     Chief Complaint  Patient presents with  . Weakness  . Nausea  . Cough     (Consider location/radiation/quality/duration/timing/severity/associated sxs/prior Treatment) Patient is a 75 y.o. female presenting with cough. The history is provided by the patient (Patient complains of cough and weakness for a number days. She's had a yellow-green sputum production).  Cough Cough characteristics:  Productive Sputum characteristics:  Nondescript Severity:  Moderate Onset quality:  Gradual Timing:  Constant Progression:  Waxing and waning Chronicity:  New Smoker: no   Context: not animal exposure   Associated symptoms: no chest pain, no eye discharge, no headaches and no rash     Past Medical History  Diagnosis Date  . COPD (chronic obstructive pulmonary disease) ([place])   . Cigarette smoker   . Mitral valve prolapse   . Palpitations   . Cerebrovascular disease   . Aneurysm ([place])   . Venous insufficiency   . Hyperlipidemia   . Hypothyroidism   . IBS (irritable bowel syndrome)   . Nephrolithiasis   . Tendinitis of right elbow   . Fibromyalgia   . Osteopenia   . Vitamin D deficiency   . AN (acoustic neuroma) ([place])   . Anxiety   . Insomnia   . Coronary artery disease    Past Surgical History  Procedure Laterality Date  . Translabyrinthine procedure  2002    [facility] Dr. Vicie Mutters  . Total abdominal hysterectomy    . Breast lumpectomy    . Aneurysm coiling  11-10    Dr. Estanislado Pandy  . Left heart catheterization with coronary angiogram N/A 08/05/2014    Procedure: LEFT HEART CATHETERIZATION WITH CORONARY ANGIOGRAM;  Surgeon: Wellington Hampshire, MD;  Location: [place] CATH LAB;  Service: Cardiovascular;  Laterality: N/A;   Family History  Problem Relation Age of Onset  . Heart attack Mother   . Diabetes Mother   . Cancer Father   . Diabetes Sister   . Diabetes  Brother   . Cancer Sister     LUNG  . Cancer Brother     Lung   Social History  Substance Use Topics  . Smoking status: Current Every Day Smoker -- 0.25 packs/day for 50 years    Types: Cigarettes  . Smokeless tobacco: Never Used     Comment: 4-5 cigs daily  is aware she needs to quit  . Alcohol Use: No   OB History    No data available     Review of Systems  Constitutional: Negative for appetite change and fatigue.  HENT: Negative for congestion, ear discharge and sinus pressure.   Eyes: Negative for discharge.  Respiratory: Positive for cough.   Cardiovascular: Negative for chest pain.  Gastrointestinal: Negative for abdominal pain and diarrhea.  Genitourinary: Negative for frequency and hematuria.  Musculoskeletal: Negative for back pain.  Skin: Negative for rash.  Neurological: Negative for seizures and headaches.  Psychiatric/Behavioral: Negative for hallucinations.      Allergies  Atorvastatin; Hydrocodone-acetaminophen; Morphine; and Topamax  Home Medications   Prior to Admission medications   Medication Sig Start Date End Date Taking? Authorizing Provider  acetaminophen (TYLENOL) 500 MG tablet Take 500 mg by mouth every 6 (six) hours as needed for mild pain.     Historical Provider, MD  aspirin 325 MG tablet Take 325 mg by mouth daily.  Historical Provider, MD  calcium gluconate 500 MG tablet Take 1 tablet by mouth daily.    Historical Provider, MD  cholecalciferol (VITAMIN D) 1000 UNITS tablet Take 1,000 Units by mouth daily.      Historical Provider, MD  clopidogrel (PLAVIX) 75 MG tablet Take 1 tablet (75 mg total) by mouth daily. 10/20/15   Noralee Space, MD  clorazepate (TRANXENE) 7.5 MG tablet Take 1 tablet (7.5 mg total) by mouth 3 (three) times daily as needed for anxiety. 07/01/15   Noralee Space, MD  diazepam (VALIUM) 5 MG tablet Take 1 tablet (5 mg total) by mouth once. Take 20 - 30 minutes before scan. Patient not taking: Reported on 10/20/2015  07/08/15   Pieter Partridge, DO  gabapentin (NEURONTIN) 300 MG capsule Increase to 1 capsule twice daily for 7 days, then 1 capsule in morning and 2 capsules at bedtime. 09/07/15   Pieter Partridge, DO  gabapentin (NEURONTIN) 300 MG capsule Take 300mg  in AM and 600mg  at night 11/18/15   Pieter Partridge, DO  hydroxypropyl methylcellulose / hypromellose (ISOPTO TEARS / GONIOVISC) 2.5 % ophthalmic solution Place 1 drop into both eyes as needed for dry eyes.    Historical Provider, MD  levofloxacin (LEVAQUIN) 250 MG tablet Take 1 tablet (250 mg total) by mouth daily. 12/17/15   Milton Ferguson, MD  levothyroxine (SYNTHROID, LEVOTHROID) 75 MCG tablet TAKE 1 TABLET DAILY BEFORE BREAKFAST 12/08/15   Noralee Space, MD  metoprolol tartrate (LOPRESSOR) 25 MG tablet TAKE 1 TABLET TWICE A DAY 08/11/15   Noralee Space, MD  pramipexole (MIRAPEX) 0.25 MG tablet TAKE 1 TABLET AT BEDTIME FOR RESTLESS LEG SYMPTOMS 05/04/15   Noralee Space, MD  pravastatin (PRAVACHOL) 40 MG tablet TAKE 1 TABLET DAILY 05/17/15   Noralee Space, MD  senna-docusate (SENOKOT-S) 8.6-50 MG per tablet Take 1 tablet by mouth at bedtime.     Historical Provider, MD  tiZANidine (ZANAFLEX) 2 MG tablet Take 1tab at bedtime for 7 days, then 1 tab twice daily.  Call for refills. Patient not taking: Reported on 10/20/2015 05/31/15   Pieter Partridge, DO  traMADol (ULTRAM) 50 MG tablet Take 1 tablet (50 mg total) by mouth 3 (three) times daily as needed for moderate pain. 08/25/15   Noralee Space, MD   BP 156/67 mmHg  Pulse 66  Temp(Src) 98.1 F (36.7 C) (Oral)  Resp 16  Ht 5\' 2"  (1.575 m)  Wt 121 lb (54.885 kg)  BMI 22.13 kg/m2  SpO2 100% Physical Exam  Constitutional: She is oriented to person, place, and time. She appears well-developed.  HENT:  Head: Normocephalic.  Eyes: Conjunctivae and EOM are normal. No scleral icterus.  Neck: Neck supple. No thyromegaly present.  Cardiovascular: Normal rate and regular rhythm.  Exam reveals no gallop and no friction rub.    No murmur heard. Pulmonary/Chest: No stridor. She has no wheezes. She has no rales. She exhibits no tenderness.  Abdominal: She exhibits no distension. There is no tenderness. There is no rebound.  Musculoskeletal: Normal range of motion. She exhibits no edema.  Lymphadenopathy:    She has no cervical adenopathy.  Neurological: She is oriented to person, place, and time. She exhibits normal muscle tone. Coordination normal.  Skin: No rash noted. No erythema.  Psychiatric: She has a normal mood and affect. Her behavior is normal.    ED Course  Procedures (including critical care time) Labs Review Labs Reviewed  COMPREHENSIVE METABOLIC PANEL -  Abnormal; Notable for the following:    Potassium 3.3 (*)    ALT 12 (*)    All other components within normal limits  CBC - Abnormal; Notable for the following:    WBC 10.9 (*)    All other components within normal limits  LIPASE, BLOOD  URINALYSIS, ROUTINE W REFLEX MICROSCOPIC (NOT AT [facility])  I-STAT CG4 LACTIC ACID, ED  I-STAT CG4 LACTIC ACID, ED    Imaging Review Dg Chest 2 View  12/17/2015  CLINICAL DATA:  Four days of cough and weakness ; history of COPD, current smoker ; coronary artery and peripheral vascular disease. EXAM: CHEST  2 VIEW COMPARISON:  Chest x-ray of August 04, 2014 FINDINGS: The lungs are mildly hyperinflated with hemidiaphragm flattening. There is stable scarring at the left lung base. The heart and mediastinal structures are normal. There is calcification in the wall of the aortic arch. The mediastinum is normal in width. The bony thorax exhibits no acute abnormality. IMPRESSION: COPD no acute cardiopulmonary abnormality. Aortic atherosclerosis. Electronically Signed   By: David  Martinique M.D.   On: 12/17/2015 13:12   I have personally reviewed and evaluated these images and lab results as part of my medical decision-making.   EKG Interpretation None      MDM   Final diagnoses:  Bronchitis   Labs and chest x-ray  unremarkable. Patient will be treated for bronchitis with Levaquin and will follow-up with her PCP if not improving    Milton Ferguson, MD 12/17/15 1550

## 2015-12-17 NOTE — Discharge Instructions (Signed)
Follow up with your md next week if not improving °

## 2015-12-17 NOTE — ED Notes (Signed)
Pt is in stable condition upon d/c and ambulates from ED. 

## 2015-12-17 NOTE — Telephone Encounter (Signed)
Spoke with pt and she states that for the past 2 weeks she has had increasing fatigue, weakness, loss of appetite with nausea/vomiting that is worse when she lays down. Pt also reports pain down her left arm. Pt denies CP/tightness/cough/wheeze. Pt states that she "has a feeling something is wrong".  SN Please advise. Thanks!    Allergies  Allergen Reactions  . Atorvastatin Other (See Comments)     Lipitor caused arm pain (3/09)  . Hydrocodone-Acetaminophen Other (See Comments)    hallucinations  . Morphine Other (See Comments)    hallucinations  . Topamax [Topiramate] Nausea And Vomiting    *dizziness*    Current Outpatient Prescriptions on File Prior to Visit  Medication Sig Dispense Refill  . acetaminophen (TYLENOL) 500 MG tablet Take 500 mg by mouth every 6 (six) hours as needed for mild pain.     Marland Kitchen aspirin 325 MG tablet Take 325 mg by mouth daily.      . calcium gluconate 500 MG tablet Take 1 tablet by mouth daily.    . cholecalciferol (VITAMIN D) 1000 UNITS tablet Take 1,000 Units by mouth daily.      . clopidogrel (PLAVIX) 75 MG tablet Take 1 tablet (75 mg total) by mouth daily. 90 tablet 3  . clorazepate (TRANXENE) 7.5 MG tablet Take 1 tablet (7.5 mg total) by mouth 3 (three) times daily as needed for anxiety. 90 tablet 5  . diazepam (VALIUM) 5 MG tablet Take 1 tablet (5 mg total) by mouth once. Take 20 - 30 minutes before scan. (Patient not taking: Reported on 10/20/2015) 1 tablet 0  . gabapentin (NEURONTIN) 300 MG capsule Increase to 1 capsule twice daily for 7 days, then 1 capsule in morning and 2 capsules at bedtime. 90 capsule 3  . gabapentin (NEURONTIN) 300 MG capsule Take 300mg  in AM and 600mg  at night 270 capsule 0  . hydroxypropyl methylcellulose / hypromellose (ISOPTO TEARS / GONIOVISC) 2.5 % ophthalmic solution Place 1 drop into both eyes as needed for dry eyes.    Marland Kitchen levothyroxine (SYNTHROID, LEVOTHROID) 75 MCG tablet TAKE 1 TABLET DAILY BEFORE BREAKFAST 90 tablet 3   . metoprolol tartrate (LOPRESSOR) 25 MG tablet TAKE 1 TABLET TWICE A DAY 180 tablet 2  . pramipexole (MIRAPEX) 0.25 MG tablet TAKE 1 TABLET AT BEDTIME FOR RESTLESS LEG SYMPTOMS 90 tablet 2  . pravastatin (PRAVACHOL) 40 MG tablet TAKE 1 TABLET DAILY 90 tablet 2  . senna-docusate (SENOKOT-S) 8.6-50 MG per tablet Take 1 tablet by mouth at bedtime.     Marland Kitchen tiZANidine (ZANAFLEX) 2 MG tablet Take 1tab at bedtime for 7 days, then 1 tab twice daily.  Call for refills. (Patient not taking: Reported on 10/20/2015) 60 tablet 0  . traMADol (ULTRAM) 50 MG tablet Take 1 tablet (50 mg total) by mouth 3 (three) times daily as needed for moderate pain. 270 tablet 0   No current facility-administered medications on file prior to visit.

## 2015-12-20 ENCOUNTER — Ambulatory Visit: Payer: Medicare Other | Admitting: Pulmonary Disease

## 2015-12-28 ENCOUNTER — Encounter: Payer: Self-pay | Admitting: Neurology

## 2015-12-28 ENCOUNTER — Ambulatory Visit (INDEPENDENT_AMBULATORY_CARE_PROVIDER_SITE_OTHER): Payer: Medicare Other | Admitting: Neurology

## 2015-12-28 VITALS — BP 134/68 | HR 76 | Ht 62.0 in | Wt 123.0 lb

## 2015-12-28 DIAGNOSIS — G44201 Tension-type headache, unspecified, intractable: Secondary | ICD-10-CM

## 2015-12-28 DIAGNOSIS — I6350 Cerebral infarction due to unspecified occlusion or stenosis of unspecified cerebral artery: Secondary | ICD-10-CM | POA: Diagnosis not present

## 2015-12-28 DIAGNOSIS — M5010 Cervical disc disorder with radiculopathy, unspecified cervical region: Secondary | ICD-10-CM | POA: Diagnosis not present

## 2015-12-28 DIAGNOSIS — G450 Vertebro-basilar artery syndrome: Secondary | ICD-10-CM

## 2015-12-28 DIAGNOSIS — M5417 Radiculopathy, lumbosacral region: Secondary | ICD-10-CM | POA: Diagnosis not present

## 2015-12-28 DIAGNOSIS — G4485 Primary stabbing headache: Secondary | ICD-10-CM

## 2015-12-28 DIAGNOSIS — Z720 Tobacco use: Secondary | ICD-10-CM

## 2015-12-28 MED ORDER — ZONISAMIDE 50 MG PO CAPS: 50.0000 mg | ORAL_CAPSULE | Freq: Every day | ORAL | Status: DC

## 2015-12-28 NOTE — Progress Notes (Signed)
NEUROLOGY FOLLOW UP OFFICE NOTE  Ana Santos [id number]  HISTORY OF PRESENT ILLNESS: Ana Santos is a 75 year old right-handed woman with tobacco abuse, hypertension, hypothyroidism, restless leg, and history of basilar tip aneurysm status post coiling in 2010, right cerebellar infarct (incidental finding), right vestibular schwannoma status post resection 2002, and recurrent TIA who follows up for cervical spinal stenosis  and tension-type headache.  History also obtained by husband.    UPDATE: NCV-EMG performed on 09/14/15 was normal.  MRI of lumbar spine from 10/06/15 was personally reviewed and revealed multilevel degenerative disc disease with mild left lateral recess stenosis at L4-5, as well as mild neural foraminal stenosis on the left at L3-4 and bilaterally at L4-5.  She was referred to neurosurgery for evaluation and possible surgical intervention.  She saw Dr. Joya Santos, who sent her for left L4-5 nerve root injection.  It has helped with the leg pain.  However, she reports recurrence of stabbing headache, occuring 3 to 4 times a day, as well as tension-type headache.  She has been taking Tylenol.  She takes gabapentin 365m in AM and 6090mat night, which is ineffective.  Labs from 12/17/15:  Na 142, K 3.3, Cl 110, glucose 95, BUN 11, Cr 0.71, TB 0.8, alk phos 105, AST 20, ALT 12.  HISTORY: In 2010, she exhibited dizziness and weakness in the arms.  She was found to have a basilar tip aneurysm, which was coiled.  She has a history of recurrent stroke and TIAs.  She had a right occipital stroke on 07/27/13, presenting with diplopia, blurred vision and gait instability.  She had recurrent TIA symptoms on 08/26/13 with vertical diplopia and generalized weakness, lasting 1 hour.  [facility] right dilated pupil.  CT of the brain with and without contrast and CTA of the head and neck showed aneurysm tip coiling and stenting of basilar tip aneurysm with  no recurrent aneurysm, no intracranial stenosis, carotid bifurcation atherosclerotic disease with mild bilateral ICA stenosis (40%).    Last year, she developed a headache, described as a severe vice-like pressure, 8/10, across her forehead. MRI of the head was performed on 03/14/15 showed chronic cerebrovascular disease but no acute intracranial process.  It did reveal spinal stenosis at C3-4 and C4-5 with cord flattening but no signal abnormality.    In January, she began exhibiting numbness, tingling and pain in both legs and left arm.  She was started on gabapentin 30030mt bedtime.  MRI of the cervical spine was performed on 07/14/15, which showed multi-level degenerative disc disease with severe left foraminal stenosis at C3-4, severe bilateral foraminal stenosis and moderate central canal stenosis at C4-5, severe bilateral foraminal stenosis and mild spinal stenosis at C5-6, and C6-7 central disc protrusion deforming ventral cervical spinal cord with moderate bilateral foraminal stenosis.  Over the past couple of months, she has had increased pain in the left upper arm, sometimes radiating into the forearm.  She also reports an ice-cold sensation of her left shin.  At night in bed, her leg will jerk.  She continues to have the headache. She limits tramadol to twice a week.  She has history of primary stabbing headache.  It is a stabbing pain just above the right eye, lasting up to one minute. There is associated lightheadedness, but no visual disturbance, focal numbness or weakness.  It has since subsided.  She takes tramadol.  She has chronic tinnitus in the right ear  since her vestibular schwannoma.  She previously had side effects to topiramate.  PAST MEDICAL HISTORY: Past Medical History  Diagnosis Date  . COPD (chronic obstructive pulmonary disease) ([place])   . Cigarette smoker   . Mitral valve prolapse   . Palpitations   . Cerebrovascular disease   . Aneurysm ([place])   . Venous  insufficiency   . Hyperlipidemia   . Hypothyroidism   . IBS (irritable bowel syndrome)   . Nephrolithiasis   . Tendinitis of right elbow   . Fibromyalgia   . Osteopenia   . Vitamin D deficiency   . AN (acoustic neuroma) ([place])   . Anxiety   . Insomnia   . Coronary artery disease     MEDICATIONS: Current Outpatient Prescriptions on File Prior to Visit  Medication Sig Dispense Refill  . acetaminophen (TYLENOL) 500 MG tablet Take 500 mg by mouth every 6 (six) hours as needed for mild pain.     Marland Kitchen aspirin 325 MG tablet Take 325 mg by mouth daily.      . calcium gluconate 500 MG tablet Take 1 tablet by mouth daily.    . cholecalciferol (VITAMIN D) 1000 UNITS tablet Take 1,000 Units by mouth daily.      . clopidogrel (PLAVIX) 75 MG tablet Take 1 tablet (75 mg total) by mouth daily. 90 tablet 3  . clorazepate (TRANXENE) 7.5 MG tablet Take 1 tablet (7.5 mg total) by mouth 3 (three) times daily as needed for anxiety. 90 tablet 5  . gabapentin (NEURONTIN) 300 MG capsule Take 328m in AM and 603mat night 270 capsule 0  . hydroxypropyl methylcellulose / hypromellose (ISOPTO TEARS / GONIOVISC) 2.5 % ophthalmic solution Place 1 drop into both eyes as needed for dry eyes.    . Marland Kitchenevofloxacin (LEVAQUIN) 250 MG tablet Take 1 tablet (250 mg total) by mouth daily. 10 tablet 0  . levothyroxine (SYNTHROID, LEVOTHROID) 75 MCG tablet TAKE 1 TABLET DAILY BEFORE BREAKFAST 90 tablet 3  . metoprolol tartrate (LOPRESSOR) 25 MG tablet TAKE 1 TABLET TWICE A DAY 180 tablet 2  . pramipexole (MIRAPEX) 0.25 MG tablet TAKE 1 TABLET AT BEDTIME FOR RESTLESS LEG SYMPTOMS 90 tablet 2  . pravastatin (PRAVACHOL) 40 MG tablet TAKE 1 TABLET DAILY 90 tablet 2  . senna-docusate (SENOKOT-S) 8.6-50 MG per tablet Take 1 tablet by mouth at bedtime.     . traMADol (ULTRAM) 50 MG tablet Take 1 tablet (50 mg total) by mouth 3 (three) times daily as needed for moderate pain. 270 tablet 0   No current facility-administered medications on  file prior to visit.    ALLERGIES: Allergies  Allergen Reactions  . Atorvastatin Other (See Comments)     Lipitor caused arm pain (3/09)  . Hydrocodone-Acetaminophen Other (See Comments)    hallucinations  . Morphine Other (See Comments)    hallucinations  . Topamax [Topiramate] Nausea And Vomiting    *dizziness*    FAMILY HISTORY: Family History  Problem Relation Age of Onset  . Heart attack Mother   . Diabetes Mother   . Cancer Father   . Diabetes Sister   . Diabetes Brother   . Cancer Sister     LUNG  . Cancer Brother     Lung    SOCIAL HISTORY: Social History   Social History  . Marital Status: Married    Spouse Name: N/A  . Number of Children: N/A  . Years of Education: N/A   Occupational History  . alterations  Social History Main Topics  . Smoking status: Current Every Day Smoker -- 0.25 packs/day for 50 years    Types: Cigarettes  . Smokeless tobacco: Never Used     Comment: 4-5 cigs daily  is aware she needs to quit  . Alcohol Use: No  . Drug Use: No  . Sexual Activity: No   Other Topics Concern  . Not on file   Social History Narrative    REVIEW OF SYSTEMS: Constitutional: No fevers, chills, or sweats, no generalized fatigue, change in appetite Eyes: No visual changes, double vision, eye pain Ear, nose and throat: No hearing loss, ear pain, nasal congestion, sore throat Cardiovascular: No chest pain, palpitations Respiratory:  No shortness of breath at rest or with exertion, wheezes GastrointestinaI: No nausea, vomiting, diarrhea, abdominal pain, fecal incontinence Genitourinary:  No dysuria, urinary retention or frequency Musculoskeletal:  No neck pain, back pain Integumentary: No rash, pruritus, skin lesions Neurological: as above Psychiatric: No depression, insomnia, anxiety Endocrine: No palpitations, fatigue, diaphoresis, mood swings, change in appetite, change in weight, increased thirst Hematologic/Lymphatic:  No purpura,  petechiae. Allergic/Immunologic: no itchy/runny eyes, nasal congestion, recent allergic reactions, rashes  PHYSICAL EXAM: Filed Vitals:   12/28/15 0843  BP: 134/68  Pulse: 76   General: No acute distress.  Patient appears well-groomed.  normal body habitus. Head:  Normocephalic/atraumatic Eyes:  Fundi examined but not visualized Neck: supple, no paraspinal tenderness, full range of motion Heart:  Regular rate and rhythm Lungs:  Clear to auscultation bilaterally Back: No paraspinal tenderness Neurological Exam: alert and oriented to person, place, and time. Attention span and concentration intact, recent and remote memory intact, fund of knowledge intact.  Speech fluent and not dysarthric, language intact.  CN II-XII intact. Bulk and tone normal, muscle strength 5/5 throughout.  Deep tendon reflexes brisk, toes downgoing.  Finger to nose and heel to shin testing intact.  Gait briefly unsteady but overall normal stride  IMPRESSION: Primary Stabbing Headache Chronic tension type headache Cervical stenosis Lumbar radiculopathy Vertebrobasilar insufficiency Tobacco use   PLAN: 1.  Will start zonisamide 30m at bedtime for headache.   Will contact [place] 4 weeks with update. 2.  Dual antiplatelet therapy for vertebrobasilar insufficiency/history of stroke 3.  Statin therapy as managed by PCP (LDL goal should be less than 70) 4.  Blood pressure control as per PCP 5.  Will taper off gabapentin 6.  Follow up with Dr. BJoya Salmregarding cervical stenosis and lumbar radiculopathy. 7.  Smoking cessation 8.  Follow up in 3 months.  25 minutes spent face to face with patient, over 50% spent discussing management.  AMetta Clines DO  CC:  STeressa Lower MD

## 2015-12-28 NOTE — Patient Instructions (Signed)
1.  Start zonisamide 50mg  at bedtime.  Contact me in 4 weeks with update and we can adjust dose if needed. 2.  Decrease gabapentin to 300mg  twice daily for 7 days, then 300mg  at bedtime for 7 days, then stop 3.  Limit use of Tylenol to no more than 2 days out of the week 4.  Follow up in 3 months.

## 2016-01-24 ENCOUNTER — Telehealth: Payer: Self-pay | Admitting: Neurology

## 2016-01-24 ENCOUNTER — Telehealth: Payer: Self-pay | Admitting: Pulmonary Disease

## 2016-01-24 MED ORDER — TRAMADOL HCL 50 MG PO TABS: 50.0000 mg | ORAL_TABLET | Freq: Three times a day (TID) | ORAL | 0 refills | Status: DC | PRN

## 2016-01-24 MED ORDER — ZONISAMIDE 50 MG PO CAPS: 100.0000 mg | ORAL_CAPSULE | Freq: Every day | ORAL | 1 refills | Status: DC

## 2016-01-24 NOTE — Telephone Encounter (Signed)
I would increase zonisamide to 100mg  at bedtime.  Again, we must give this dose 4 weeks before making any further adjustments.  Also, she should limit use of tramadol to no more than 2 days out of the week.

## 2016-01-24 NOTE — Telephone Encounter (Signed)
Pt aware. Will call back in 4 weeks with update. RX sent in.

## 2016-01-24 NOTE — Telephone Encounter (Signed)
Called and spoke with patient. Pt is calling in regards to her zonisamide 50mg  that she is taking at bedtime for headache. Pt states it is not helping. She is waking almost daily with headaches. Pt states she is having to nap almost daily about 1 p.m. Because her headache becomes too severe. Pt stated she is taking tramadol, "probably more than I should". Pt stated that "he's trying to get me off of the tramadol", but that she had to take something because pain was too great for her to tolerate without. Pt has about 1 week supply left of her zonisamide. Please advise.

## 2016-01-24 NOTE — Telephone Encounter (Signed)
PT left a message that she needed a call back about her medication/Dawn [phone]

## 2016-01-24 NOTE — Telephone Encounter (Signed)
rx has been printed out and placed on SN cart to be signed.  [facility] once done per pts request.

## 2016-01-24 NOTE — Telephone Encounter (Signed)
Pt is requesting refill on tramadol Last refilled 08/25/15 #270 x 0 refills Take 1 tablet (50 mg total) by mouth 3 (three) times daily as needed for moderate pain  Please advise Dr. Lenna Gilford thanks

## 2016-01-24 NOTE — Telephone Encounter (Signed)
rx has been signed by [facility] and faxed to the mail order pharmacy.  Pt is aware. Nothing further is needed.

## 2016-01-29 ENCOUNTER — Other Ambulatory Visit: Payer: Self-pay | Admitting: Pulmonary Disease

## 2016-02-11 ENCOUNTER — Other Ambulatory Visit: Payer: Self-pay | Admitting: Pulmonary Disease

## 2016-02-16 ENCOUNTER — Other Ambulatory Visit: Payer: Self-pay | Admitting: Neurology

## 2016-02-16 NOTE — Telephone Encounter (Signed)
12/18/15 OV A&P states, "5.  Will taper off gabapentin"  Patient instructions states "2.  Decrease gabapentin to 300mg  twice daily for 7 days, then 300mg  at bedtime for 7 days, then stop"  Will deny refill since taper was to last 2 weeks and that was 2 months ago.

## 2016-02-24 ENCOUNTER — Telehealth: Payer: Self-pay | Admitting: Neurology

## 2016-02-24 MED ORDER — GABAPENTIN 300 MG PO CAPS: ORAL_CAPSULE | ORAL | 2 refills | Status: DC

## 2016-02-24 NOTE — Telephone Encounter (Signed)
RX sent in

## 2016-02-24 NOTE — Telephone Encounter (Signed)
PT left a message that she needed a medication refill/Dawn CB# [phone]

## 2016-03-17 ENCOUNTER — Ambulatory Visit: Payer: Medicare Other

## 2016-03-17 ENCOUNTER — Ambulatory Visit (INDEPENDENT_AMBULATORY_CARE_PROVIDER_SITE_OTHER): Payer: Medicare Other

## 2016-03-17 ENCOUNTER — Telehealth: Payer: Self-pay | Admitting: Pulmonary Disease

## 2016-03-17 DIAGNOSIS — Z23 Encounter for immunization: Secondary | ICD-10-CM

## 2016-03-17 NOTE — Telephone Encounter (Signed)
Pt is here for flu vaccine and PNA vaccine that was scheduled for the pt by someone in the system.  Pt had the PNA 23 back in 2009.  SN please advise of which PNA vaccine the pt can get today if needed.   Thanks  Pt is here in the lobby now.

## 2016-03-17 NOTE — Telephone Encounter (Signed)
Per SN---  She will need the prevnar 13 but we will do this at her next [facility] for her to get the flu vaccine today.   I advised the pt of this and nothing further is needed.

## 2016-03-31 ENCOUNTER — Encounter: Payer: Self-pay | Admitting: Neurology

## 2016-03-31 ENCOUNTER — Ambulatory Visit (INDEPENDENT_AMBULATORY_CARE_PROVIDER_SITE_OTHER): Payer: Medicare Other | Admitting: Neurology

## 2016-03-31 VITALS — BP 120/62 | HR 99 | Ht 61.0 in | Wt 124.0 lb

## 2016-03-31 DIAGNOSIS — I6350 Cerebral infarction due to unspecified occlusion or stenosis of unspecified cerebral artery: Secondary | ICD-10-CM

## 2016-03-31 DIAGNOSIS — F17200 Nicotine dependence, unspecified, uncomplicated: Secondary | ICD-10-CM | POA: Diagnosis not present

## 2016-03-31 DIAGNOSIS — G44229 Chronic tension-type headache, not intractable: Secondary | ICD-10-CM | POA: Diagnosis not present

## 2016-03-31 DIAGNOSIS — M4802 Spinal stenosis, cervical region: Secondary | ICD-10-CM | POA: Diagnosis not present

## 2016-03-31 DIAGNOSIS — R51 Headache: Secondary | ICD-10-CM | POA: Diagnosis not present

## 2016-03-31 DIAGNOSIS — G4486 Cervicogenic headache: Secondary | ICD-10-CM

## 2016-03-31 MED ORDER — NORTRIPTYLINE HCL 10 MG PO CAPS: 10.0000 mg | ORAL_CAPSULE | Freq: Every day | ORAL | 3 refills | Status: DC

## 2016-03-31 NOTE — Patient Instructions (Signed)
1.  Stop zonisamide 2.  Instead, start nortriptyline 10mg  at bedtime.  Contact me in 4 weeks with update and we can increase dose if needed.  Side effects may include sleepiness, dizziness or dry mouth.  That is why we recommend taking it at bedtime and be sure to keep hydrated. 3.  Follow up in 3 months.

## 2016-03-31 NOTE — Progress Notes (Signed)
NEUROLOGY FOLLOW UP OFFICE NOTE  AJAE STFLEUR [id number]  HISTORY OF PRESENT ILLNESS: Ana Santos is a 75 year old right-handed woman with tobacco abuse, hypertension, hypothyroidism, restless leg, and history of basilar tip aneurysm status post coiling in 2010, right cerebellar infarct (incidental finding), right vestibular schwannoma status post resection 2002, and recurrent TIA who follows up for cervical spinal stenosis  and tension-type headache.  History also obtained by husband.     UPDATE: Stabbing headaches are seldom, but she continues to have daily tension-type headache.  Zonisamide 100mg  at bedtime is ineffective. She no longer takes tramadol.   HISTORY: In 2010, she exhibited dizziness and weakness in the arms.  She was found to have a basilar tip aneurysm, which was coiled.   Cerebrovascular Disease: She has a history of recurrent stroke and TIAs.  She had a right occipital stroke on 07/27/13, presenting with diplopia, blurred vision and gait instability.  She had recurrent TIA symptoms on 08/26/13 with vertical diplopia and generalized weakness, lasting 1 hour.  She was brought to [facility] where EMS reported right dilated pupil.  CT of the brain with and without contrast and CTA of the head and neck showed aneurysm tip coiling and stenting of basilar tip aneurysm with no recurrent aneurysm, no intracranial stenosis, carotid bifurcation atherosclerotic disease with mild bilateral ICA stenosis (40%).     Headaches: Primary Stabbing Headache:  It is a stabbing pain just above the right eye, lasting up to one minute. There is associated lightheadedness, but no visual disturbance, focal numbness or weakness.  It has since subsided.  She takes tramadol.  Chronic tension-type headache:  described as a severe vice-like pressure, 8/10, across her forehead. MRI of the head was performed on 03/14/15 showed chronic cerebrovascular disease but no acute intracranial process.  It did  reveal spinal stenosis at C3-4 and C4-5 with cord flattening but no signal abnormality.    Past medication:  Topiramate, gabapentin  Cervical and lumbar stenosis: In January 2017, she began exhibiting numbness, tingling and pain in both legs and left arm.  She was started on gabapentin 300mg  at bedtime.  MRI of the cervical spine was performed on 07/14/15, which showed multi-level degenerative disc disease with severe left foraminal stenosis at C3-4, severe bilateral foraminal stenosis and moderate central canal stenosis at C4-5, severe bilateral foraminal stenosis and mild spinal stenosis at C5-6, and C6-7 central disc protrusion deforming ventral cervical spinal cord with moderate bilateral foraminal stenosis.  Over the past couple of months, she has had increased pain in the left upper arm, sometimes radiating into the forearm.  She also reports an ice-cold sensation of her left shin.  At night in bed, her leg will jerk.  She continues to have the headache. She limits tramadol to twice a week.  NCV-EMG performed on 09/14/15 was normal.  MRI of lumbar spine from 10/06/15 was personally reviewed and revealed multilevel degenerative disc disease with mild left lateral recess stenosis at L4-5, as well as mild neural foraminal stenosis on the left at L3-4 and bilaterally at L4-5.  She was referred to neurosurgery for evaluation and possible surgical intervention.  She saw Dr. Joya Salm, who sent her for left L4-5 nerve root injection, which helped with leg pain.     She has chronic tinnitus in the right ear since her vestibular schwannoma. PAST MEDICAL HISTORY: Past Medical History:  Diagnosis Date  . AN (acoustic neuroma) ([place])   . Aneurysm ([place])   . Anxiety   .  Cerebrovascular disease   . Cigarette smoker   . COPD (chronic obstructive pulmonary disease) ([place])   . Coronary artery disease   . Fibromyalgia   . Hyperlipidemia   . Hypothyroidism   . IBS (irritable bowel syndrome)   . Insomnia   . Mitral  valve prolapse   . Nephrolithiasis   . Osteopenia   . Palpitations   . Tendinitis of right elbow   . Venous insufficiency   . Vitamin D deficiency     MEDICATIONS: Current Outpatient Prescriptions on File Prior to Visit  Medication Sig Dispense Refill  . acetaminophen (TYLENOL) 500 MG tablet Take 500 mg by mouth every 6 (six) hours as needed for mild pain.     Marland Kitchen aspirin 325 MG tablet Take 325 mg by mouth daily.      . calcium gluconate 500 MG tablet Take 1 tablet by mouth daily.    . cholecalciferol (VITAMIN D) 1000 UNITS tablet Take 1,000 Units by mouth daily.      . clopidogrel (PLAVIX) 75 MG tablet Take 1 tablet (75 mg total) by mouth daily. 90 tablet 3  . clorazepate (TRANXENE) 7.5 MG tablet Take 1 tablet (7.5 mg total) by mouth 3 (three) times daily as needed for anxiety. 90 tablet 5  . hydroxypropyl methylcellulose / hypromellose (ISOPTO TEARS / GONIOVISC) 2.5 % ophthalmic solution Place 1 drop into both eyes as needed for dry eyes.    Marland Kitchen levothyroxine (SYNTHROID, LEVOTHROID) 75 MCG tablet TAKE 1 TABLET DAILY BEFORE BREAKFAST 90 tablet 3  . metoprolol tartrate (LOPRESSOR) 25 MG tablet TAKE 1 TABLET TWICE A DAY 180 tablet 2  . pramipexole (MIRAPEX) 0.25 MG tablet TAKE 1 TABLET AT BEDTIME FOR RESTLESS LEG SYMPTOMS 90 tablet 1  . pravastatin (PRAVACHOL) 40 MG tablet TAKE 1 TABLET DAILY 90 tablet 2  . senna-docusate (SENOKOT-S) 8.6-50 MG per tablet Take 1 tablet by mouth at bedtime.      No current facility-administered medications on file prior to visit.     ALLERGIES: Allergies  Allergen Reactions  . Atorvastatin Other (See Comments)     Lipitor caused arm pain (3/09)  . Hydrocodone-Acetaminophen Other (See Comments)    hallucinations  . Morphine Other (See Comments)    hallucinations  . Topamax [Topiramate] Nausea And Vomiting    *dizziness*    FAMILY HISTORY: Family History  Problem Relation Age of Onset  . Heart attack Mother   . Diabetes Mother   . Cancer Father     . Diabetes Sister   . Diabetes Brother   . Cancer Sister     LUNG  . Cancer Brother     Lung    SOCIAL HISTORY: Social History   Social History  . Marital status: Married    Spouse name: N/A  . Number of children: N/A  . Years of education: N/A   Occupational History  . alterations    Social History Main Topics  . Smoking status: Current Every Day Smoker    Packs/day: 0.25    Years: 50.00    Types: Cigarettes  . Smokeless tobacco: Never Used     Comment: 4-5 cigs daily  is aware she needs to quit  . Alcohol use No  . Drug use: No  . Sexual activity: No   Other Topics Concern  . Not on file   Social History Narrative  . No narrative on file    REVIEW OF SYSTEMS: Constitutional: No fevers, chills, or sweats, no generalized fatigue, change in  appetite Eyes: No visual changes, double vision, eye pain Ear, nose and throat: No hearing loss, ear pain, nasal congestion, sore throat Cardiovascular: No chest pain, palpitations Respiratory:  No shortness of breath at rest or with exertion, wheezes GastrointestinaI: No nausea, vomiting, diarrhea, abdominal pain, fecal incontinence Genitourinary:  No dysuria, urinary retention or frequency Musculoskeletal:  No neck pain, back pain Integumentary: No rash, pruritus, skin lesions Neurological: as above Psychiatric: No depression, insomnia, anxiety Endocrine: No palpitations, fatigue, diaphoresis, mood swings, change in appetite, change in weight, increased thirst Hematologic/Lymphatic:  No purpura, petechiae. Allergic/Immunologic: no itchy/runny eyes, nasal congestion, recent allergic reactions, rashes  PHYSICAL EXAM: Vitals:   03/31/16 0726  BP: 120/62  Pulse: 99   General: No acute distress.  Patient appears well-groomed.  normal body habitus. Head:  Normocephalic/atraumatic Eyes:  Fundi examined but not visualized Neck: supple, bilateral mid paraspinal tenderness, full range of motion Heart:  Regular rate and  rhythm Lungs:  Clear to auscultation bilaterally Back: No paraspinal tenderness Neurological Exam: alert and oriented to person, place, and time. Attention span and concentration intact, recent and remote memory intact, fund of knowledge intact.  Speech fluent and not dysarthric, language intact.  CN II-XII intact. Bulk and tone normal, muscle strength 5/5 throughout.  Sensation to light touch  intact.  Deep tendon reflexes 2+ throughout.  Finger to nose testing intact.  Gait normal  IMPRESSION: Chronic tension-type headache, secondary to cervical stenosis Primary stabbing headache stable Tobacco use disorder  PLAN: 1.  Will stop zonisamide and start nortriptyline 10mg  at bedtime.  She will contact me in 4 weeks with update and we can increase dose if needed.  If nortriptyline is ineffective, then we can add/try tizanidine.  If medications are not helpful, I recommended that she see a spine specialist for the neck (or interventional pain specialist for possible injections. 2.  Follow up in 3 months. 3.  Smoking cessation  25 minutes spent face to face with patient, over 50% spent counseling.  Metta Clines, DO  CC:  Teressa Lower, MD

## 2016-04-24 ENCOUNTER — Encounter: Payer: Self-pay | Admitting: Pulmonary Disease

## 2016-04-24 ENCOUNTER — Ambulatory Visit (INDEPENDENT_AMBULATORY_CARE_PROVIDER_SITE_OTHER): Payer: Medicare Other | Admitting: Pulmonary Disease

## 2016-04-24 ENCOUNTER — Other Ambulatory Visit (INDEPENDENT_AMBULATORY_CARE_PROVIDER_SITE_OTHER): Payer: Medicare Other

## 2016-04-24 VITALS — BP 120/72 | HR 58 | Temp 98.9°F | Ht 62.0 in | Wt 125.2 lb

## 2016-04-24 DIAGNOSIS — D333 Benign neoplasm of cranial nerves: Secondary | ICD-10-CM

## 2016-04-24 DIAGNOSIS — M899 Disorder of bone, unspecified: Secondary | ICD-10-CM | POA: Diagnosis not present

## 2016-04-24 DIAGNOSIS — F17200 Nicotine dependence, unspecified, uncomplicated: Secondary | ICD-10-CM

## 2016-04-24 DIAGNOSIS — R946 Abnormal results of thyroid function studies: Secondary | ICD-10-CM

## 2016-04-24 DIAGNOSIS — E039 Hypothyroidism, unspecified: Secondary | ICD-10-CM

## 2016-04-24 DIAGNOSIS — E559 Vitamin D deficiency, unspecified: Secondary | ICD-10-CM

## 2016-04-24 DIAGNOSIS — R7989 Other specified abnormal findings of blood chemistry: Secondary | ICD-10-CM

## 2016-04-24 DIAGNOSIS — E782 Mixed hyperlipidemia: Secondary | ICD-10-CM

## 2016-04-24 DIAGNOSIS — J449 Chronic obstructive pulmonary disease, unspecified: Secondary | ICD-10-CM

## 2016-04-24 DIAGNOSIS — I6350 Cerebral infarction due to unspecified occlusion or stenosis of unspecified cerebral artery: Secondary | ICD-10-CM

## 2016-04-24 DIAGNOSIS — I63331 Cerebral infarction due to thrombosis of right posterior cerebral artery: Secondary | ICD-10-CM

## 2016-04-24 DIAGNOSIS — F411 Generalized anxiety disorder: Secondary | ICD-10-CM

## 2016-04-24 DIAGNOSIS — I679 Cerebrovascular disease, unspecified: Secondary | ICD-10-CM | POA: Diagnosis not present

## 2016-04-24 DIAGNOSIS — G2581 Restless legs syndrome: Secondary | ICD-10-CM

## 2016-04-24 DIAGNOSIS — M949 Disorder of cartilage, unspecified: Secondary | ICD-10-CM

## 2016-04-24 DIAGNOSIS — J4489 Other specified chronic obstructive pulmonary disease: Secondary | ICD-10-CM

## 2016-04-24 LAB — CBC WITH DIFFERENTIAL/PLATELET
BASOS ABS: 0.1 10*3/uL (ref 0.0–0.1)
Basophils Relative: 0.5 % (ref 0.0–3.0)
EOS ABS: 0.3 10*3/uL (ref 0.0–0.7)
Eosinophils Relative: 2.9 % (ref 0.0–5.0)
HCT: 39.2 % (ref 36.0–46.0)
Hemoglobin: 12.9 g/dL (ref 12.0–15.0)
LYMPHS ABS: 3 10*3/uL (ref 0.7–4.0)
Lymphocytes Relative: 24.7 % (ref 12.0–46.0)
MCHC: 33 g/dL (ref 30.0–36.0)
MCV: 87.4 fl (ref 78.0–100.0)
MONO ABS: 0.7 10*3/uL (ref 0.1–1.0)
Monocytes Relative: 6.1 % (ref 3.0–12.0)
NEUTROS ABS: 7.9 10*3/uL — AB (ref 1.4–7.7)
NEUTROS PCT: 65.8 % (ref 43.0–77.0)
PLATELETS: 335 10*3/uL (ref 150.0–400.0)
RBC: 4.48 Mil/uL (ref 3.87–5.11)
RDW: 13.4 % (ref 11.5–15.5)
WBC: 12 10*3/uL — ABNORMAL HIGH (ref 4.0–10.5)

## 2016-04-24 LAB — LIPID PANEL
CHOLESTEROL: 169 mg/dL (ref 0–200)
HDL: 45.8 mg/dL (ref >39.00)
LDL Cholesterol: 91 mg/dL (ref 0–99)
NONHDL: 122.85
Total CHOL/HDL Ratio: 4
Triglycerides: 158 mg/dL — ABNORMAL HIGH (ref 0.0–149.0)
VLDL: 31.6 mg/dL (ref 0.0–40.0)

## 2016-04-24 LAB — COMPREHENSIVE METABOLIC PANEL
ALBUMIN: 4.2 g/dL (ref 3.5–5.2)
ALK PHOS: 121 U/L — AB (ref 39–117)
ALT: 11 U/L (ref 0–35)
AST: 17 U/L (ref 0–37)
BILIRUBIN TOTAL: 0.4 mg/dL (ref 0.2–1.2)
BUN: 14 mg/dL (ref 6–23)
CO2: 28 mEq/L (ref 19–32)
CREATININE: 0.83 mg/dL (ref 0.40–1.20)
Calcium: 9.7 mg/dL (ref 8.4–10.5)
Chloride: 106 mEq/L (ref 96–112)
GFR: 71.17 mL/min (ref >60.00)
GLUCOSE: 81 mg/dL (ref 70–99)
POTASSIUM: 4.2 meq/L (ref 3.5–5.1)
SODIUM: 142 meq/L (ref 135–145)
TOTAL PROTEIN: 7.6 g/dL (ref 6.0–8.3)

## 2016-04-24 LAB — TSH: TSH: 0.2 u[IU]/mL — ABNORMAL LOW (ref 0.35–4.50)

## 2016-04-24 LAB — VITAMIN D 25 HYDROXY (VIT D DEFICIENCY, FRACTURES): VITD: 55.64 ng/mL (ref 30.00–100.00)

## 2016-04-24 MED ORDER — CLORAZEPATE DIPOTASSIUM 7.5 MG PO TABS: 7.5000 mg | ORAL_TABLET | Freq: Three times a day (TID) | ORAL | 5 refills | Status: DC | PRN

## 2016-04-24 NOTE — Patient Instructions (Signed)
Today we updated your med list in our EPIC system...    Continue your current medications the same...  Ana Santos,  You need to quit ALL the smoking!!! PLEEEAAASSSEEE...  Today we did your follow up FASTING blood work...    We will contact you w/ the results when available...   We discussed taking the TRANXENE (Chlorazepate) 7.5mg  tabs- 2-3 per day as discussed to see if this will decrease the frequency & severity of your headaches...  Call for any questions...  Let's plan a follow up visit in 29mo, sooner if needed for problems.Marland KitchenMarland Kitchen

## 2016-04-24 NOTE — Progress Notes (Signed)
Subjective:    Patient ID: Hamilton Capri, female    DOB: 1940-10-09, 75 y.o.   MRN: [id number]  HPI 75 y/o WF here for a follow up visit... she has multiple medical problems as reviewed below...   ~  SEE PREV EPIC NOTES FOR EARLIER DATA >>   LABS 6/13:  FLP- at goals on Prav40;  Chems- wnl;  CBC- wnl;  TSH=0.71;  VitD=53...  CXR 7/14> she forgot to go to the [place] for this film...   LABS 7/14:  FLP- looks ok on Prav40;  Chems- wnl;  CBC- wnl;  TSH=2.47;  VitD=67... ADDENDUM> DrMorris ([facility], Interventional radiology) insists that we obtain her f/u CTAngiogram & send it to him, we will also obtain f/u MRI Brain to assess her acoustic neuroma...  CTA Head 9/14 showed s/p stent assisted coiling of basilar tip aneurysm, no aneurysm recurrence, patent basilar art & left post cerebral art, mild atherosclerotic calcif in cavernous carotids w/o stenosis, post op right mastoid changes w/o recurrent tumor  MRI Brain 9/14 showed no evid of resid or recurrent vestib schwannoma on the right, post surg changes w/ scarring in the int auditory canal, ols sm vessel cerebellar infarctions are stable, mild atrophy & sm vessel dis, prev coiled basilar tip aneurysm, no acute insults... ADDENDUM> Letter from DrMorris 9/14 after review of the CTA indicates that everything is stable and flow is satisfactory (he is leaving [place] can assist in the future if needed).  CXR 1/15 showed norm heart size, COPD/Emphysema w/ scarring in lingula, NAD.Marland KitchenMarland Kitchen  EKG 1/15 showed NSR, rate60, poor R prog V1-3, NSSTTWA, NAD...  2DEcho 2/15 showed normal LV size & function w/ EF=55-60%, no regional wall motion abnormalities, Gr1DD, norm valves and RV...  Holter Monitor 2/15 showed NSR, PACs, PVCs, and no pathologic arrhythmias (the skips correspond to her subjective SOB/palpit)...  ~   August 12, 2013:  add-on visit post hospital check>  Yazmyne developed diplopia & blurry vision and was adm to [facility] 2/15 -  07/30/13> review of records indicated that her neuro exam was otherw WNL, MRI Brain showed a right occipital lobe infarct, and MRA showed no new abnormality- no recurrent aneurysm, patent sent & good flow in the PCA; symptom resolved spont & they continued her same meds ([id number] & [id number]); she has been able to cut down to 1cig/d and again we reviewed the critical need to quit smoking completely... CT Brain 07/27/13 [facility] showed prior basilar tip aneurysm repair, post surg changes in the right mastoid region, NAD...  MRI Brain 2/15 showed sm areas of acute infarct in right occipital lobe (new from 9/14 MRI); chr infarcts in the cerebellum bilat, basilar art stent & coiling of basilar tip aneurysm...  MRA Brain 2/15 showed no evid for recurrent basilar tip aneurysm, mild to mod narrowing of the right P1 segment, left P1 segment stented...  CDopplers 2/15 showed some plaque in the bulbs and ICAs bilat, <50% stenoses bilat noted...   EKG 2/15 showed NSR, rate63, poor R prog V1-3, NAD...  LABS 2/15 at [facility] showed> Chems- wnl w/ Cr=1.0;  CBC- wnl w/ Hg=13.5;  Abn UA w/ UTI evident...  ~  Ahlani went to the ER 08/26/13 with another TIA characterized by diplopia & ataxia that last 70min or so; assoc mild HA, no focal neuro findings in ER; already on ASA325/ Plavix75 w/ good compliance; She denies any additional cerebral ischemic symptoms since the 3/15 ER visit; unfortunately still smoking; review of  chart shows NO documented AFib in her past...  EKG 3/15 showed NSR, rate67, poor R prog V1-3, otherw wnl...  CT Head 3/15 showed evid prior vertebral art aneurysm surg & post-op changes in right mastoid, NAD...  CTA Head & Neck 3/15 showed prev basilar tip aneurysm coiling & basilar stent, prior right mastoidectomy, vessels are patent & no recurrent aneurysm; neck showed apical scarring & emphysema, 40% left subclav stenosis, 40-50% bilat ICA stenoses, mild stenosis at the origin of the  vertebral as well...  LABS 5/15:  FLP- at goals on Prav40;  Chems & LFTs- wnl;  TSH= 0.25 on Levothy75 & rec to decr to 40mcg/d...   ~  April 17, 2014:  85mo ROV & Maley is c/o HA- sharp pains in her temples, assoc w/ occas weak spells ("don't feel like getting OOB"), poor appetite, and occas falling (no known injury); she remains on ASA325 & Plavix75> she voiced discontent w/ Neuro eval at [facility], states she never received call from her doctor (Neuro- DrHusseini);  I reviewed Care Everywhere records- summary & last note 03/11/14 from DrHusseini> extensive note reviewed, pt stable on ASA/ Plavix and no changes made, she was asked to f/u prn;  Jonasia's BP, Chol, BS- all well controlled but she is still smoking and can't vs won't quit;  She requests 2nd opinion from local [facility] due to her temporal HAs, falling, weak spells...  We reviewed the following medical problems during today's office visit >>     COPD> still smoking 1/2ppd & can't vs won't quit; not on meds, min cough/ sputum, denies hemoptysis/ SOB/ ch in DOE, etc; CXR 1/15= COPD, NAD; she MUST quit all smoking!    Palpit> new complaint 1/15> w/u in progress & asked to STOP SMOKING & avoid caffeine etc; Care Everywhere shows 30d Holter w/ PACs/ PVCs/ no AFib...    Cerebrovasc dis/ Aneurysm> on ASA325, Plavix75; she sees DrMorris IR at [facility] 8/12 w/ CTA that looked good- no resid aneurysm, distal flow appeared good & he didn't rec any changes; he requested that we do her f/u CTA & MRI Brain in [place] 9/14=> good flow thru the stented basilar art, no recurrent aneurysm, no recurrent acoustic neuroma;  Then she was [facility] 2/15 w/ TIA & MRI/MRA Brain showed acute infarct in right occipital lobe, otherw stable;  Subsequent f/u by [facility] Neurology- no change in meds, rec quit smoking and control of BP, BS, Chol...    Hyperlipid> on Prav40; tol well & FLP 11/15 shows TChol 168, TG 129, HDL 43, LDL 99; continue same + diet...    Hypothyroid>  on Levothy50 now; labs 11/15 shows TSH= 9.91 and she is rec to increase Synthroid back to 73mcg/d...    DJD/ FM> she is managing quite well w/ prn Tramadol & Tylenol; prev CWP resolved...    Osteopenia> she had min osteopenia on BMD several yrs ago & takes Calcium, MVI, Vit D 1000u daily; f/u BMD 2/15 showed TScores -0.6 in Spine, and -1.6 in right FemNeck; rec to stay on supplements & wt bearing exercise...    RLS> on Mirapex 0.25mg  Qhs as needed...     Acoustic Neuroma> this was resected by DrBrowne [facility] 2002; MRI 2/11 continued to look good- post op changes, no recurrent tumor seen; f/u MRI here 9/14 (& [facility] 2/15) continues to look good- no evid for recurrent or resid tumor...    Anxiety> on Tranxene as needed... We reviewed prob list, meds, xrays and labs> see  below for updates >> OK Flu shot today...  LABS 11/15:  FLP- at goals on Prav40;  Chems- wnl;  CBC- wnl;  TSH=9.91;  Sed=36... PLAN>>  She desperately need to quit smoking, declines smoking cessation help;  TSH is elev on Synthroid50 & she confirms daily dosing, therefore incr back to Synthroid75;  We will refer to [facility] for 2nd opinion regarding her cerebrovasc dis, HAs, falling, weak spells...   ~  August 03, 2014:  33mo ROV & acute add-on visit requested by Neuro-DrJaffe due to CP & irreg heartbeat> Zakara was at [facility] office today (for f/u of headaches- improved w/ Neurontin but intol she said due to incr palpit) & mentioned left CP & palpit, he noted skips on exam & called for a work-in appt here;  She has a cardiac hx- followed for yrs by DrStuckey w/ cath 1995 showing min nonobstructive CAD & mild MVP; she developed palpit in 2015 w/ abn EKG (poor R prog, NSTTWA)) and 2DEcho showing norm LVF, no regional wall motion abn, Gr1DD, normal valves & RV; Holter monitor revealed NSR w/ some PACs & PVCs but no dangerous arrhythmias- she was asked to avoid caffeine, pseudophed, and QUIT SMOKING (she still smokes ~1/2ppd)... As  noted she has severe cerebrovasc dis & hx Acoustic Neuroma w/ prev gamma knife surg at [facility] in 2002; a 30d Event monitor at [facility] was said to reveal PACs, PVCs, but no AFib... Current symptoms sound similar to prev w/ skipping sensation, no racing, sl SOB, mild chest discomfort on & off daily x 3-4wks (sharp, not sore to touch, sl worse w/ movement); she does not exercise & her most strenuous activity is housework...    We reviewed prob list, meds, xrays and labs> see below for updates >>   CXR 2/16 showed norm heart size, linear atx vs scarring left base, otherw neg- NAD.Marland KitchenMarland Kitchen  EKG today showed NSR, rate80, occ PVCs noted, poor R prog V1=>3, NSSTTWA... PLAN>>  We decided to start METOPROLOL 25mg - 1/2 tab Bid for now & refer to Cards for f/u eval; reminded about smoking cessation but she declines additional counseling; her husb sees DrMcLean...  ~  Oct 20, 2014:  37mo ROV & Treana reports improved after cardiac eval by DrArida (see below)- neg cath, neg 2DEcho, palpit improved w/ Metoprolol & decr caffeine etc; unfortunately she continues to smoke & will not quit, refuses smoking cessation meds etc... She is in the middle of bilat cat surg by DrBevis... No new complaints or concerns- denies cough, sput, hemoptysis, SOB, CP, f/c/s, etc; she states her main exercise is yard work... We reviewed prob list, meds, xrays and labs> see below for updates >>  PLAN>> she reports improved on current med regimen; we again discussed the need for smoking cessation, take meds regularly; she will f/u in 76mo w/ Fasting labs & call in the interim prn...  ~  April 22, 2015:  66mo ROV & Ethelyne has been having some increased HAs- went to see DrJaffe in Neuro w/ f/u MRI/MRA (no acute changes) and treated transiently w/ Pred taper & improved; she has Tramadol/ Tylenol to use as needed;  We reviewed the following medical problems during today's office visit >>     COPD> still smoking 5-6cig/d she says & can't vs won't quit; not on  meds, denies cough/ sputum/ hemoptysis/ SOB/ ch in DOE, etc; CXR 2/16= COPD, mild basilar atx, NAD; Spirometry shows mild obstructive dis- must quit all smoking!     Palpit> this  was a new complaint 1/15> w/u DrArida was neg (2DEcho, Cath) & symptoms improved w/ Metoprolol- asked to STOP SMOKING & avoid caffeine etc; Care Everywhere shows 30d Holter w/ PACs/ PVCs/ no AFib...    Cerebrovasc dis/ Aneurysm> on ASA325, Plavix75; she sees DrMorris IR at [facility] 8/12 w/ CTA that looked good- no resid aneurysm, distal flow appeared good & he didn't rec any changes; he requested that we do her f/u CTA & MRI Brain in [place] 9/14=> good flow thru the stented basilar art, no recurrent aneurysm, no recurrent acoustic neuroma;  Then she was [facility] 2/15 w/ TIA & MRI/MRA Brain showed acute infarct in right occipital lobe, otherw stable;  Subsequent f/u by [facility] Neurology- no change in meds, rec quit smoking and control of BP, BS, Chol...    Hyperlipid> on Prav40; tol well & FLP 11/16 shows TChol 140, TG 112, HDL 43, LDL 75; continue same + diet...    Hypothyroid> on Levothy75 now; labs 11/15 shows TSH= 9.91 and she was rec to increase Synthroid back to 61mcg/d; Labs 11/16 showed TSH=0.22, continue same for now...    DJD/ FM> she is managing quite well w/ prn Tramadol & Tylenol; prev CWP resolved...    Osteopenia> she had min osteopenia on BMD several yrs ago & takes Calcium, MVI, Vit D 1000u daily; f/u BMD 2/15 showed TScores -0.6 in Spine, and -1.6 in right FemNeck; rec to stay on supplements & wt bearing exercise...    RLS> on Mirapex 0.25mg  Qhs as needed...     Acoustic Neuroma> resected by DrBrowne [facility] 2002; MRI 2/11 continued to look good- post op changes, no recurrent tumor seen; f/u MRI here 9/14 (& [facility] 2/15) continues to look good- no evid for recurrent or resid tumor.    Headaches> s/p eval by Neuro, DrJaffe- 9/16 note reviewed, MRI w/o acute changes, Cspine spondy noted w/ some spinal stenosis,  treated w/ Pred taper & improved...    Anxiety> on Tranxene as needed... We reviewed prob list, meds, xrays and labs> OK 2016 FLU vaccine today...  MRI/MRA 02/22/15>  No acute infarct, tiny cbll blood breakdown products unchanged w/o new areas of intracranial hemorrhage detected, remote right cbll infarcts & sm vessel dis, postop right vestib schwannoma surg- no acute changes, cerv spondylosis w/ sp stenosis C3-4 & C4-5, s/p coiling basilar tip aneurysm, mild irregularity of basilar art, mod narrowing of P1 segm left p[ost cerebral art (mild narrowing on right)...   Spirometry 04/22/15>  FVC=2.46 (97%), FEV1=1.69 (88%), %1sec=69%, mid-flows reduced at 64% predicted;  This is c/w mild airflow obstruction and GOLD Stage 1 COPD  LABS 04/22/15>  FLP- at goals on Prav40;  Chems- wnl;  CBC- wnl;  TSH= 0.22 on Synth75;  VitD= 48 on 1000u/d... IMP/PLAN>>  Abimbola is stable overall w/ her mult serious medical issues;  We again discussed smoking cessation;  She continues to do alterations as she has done for 50+yrs;  We refilled her Tramadol & Tranxene...  ~  Oct 20, 2015:  66mo ROV & Ciearra has been stable over the interval, c/o incr pain/ discomfort in neck & back (sched for ?neck injections next week, DrJaffe, DrBotero);  She is still smoking but now down to 2cig/d she says, breathing is OK 7 she denies cough/ sput/ hemoptysis/ SOB/ or CP;  She has been walking some & doing yard work- says she is NOT limited by her breathing & she has declined breathing meds in the past... We reviewed the  following medical problems during today's office visit >>     COPD> still smoking 1-2cig/d she says & can't vs won't quit; not on meds, denies cough/ sputum/ hemoptysis/ SOB/ ch in DOE, etc; CXR 2/16= COPD, mild basilar atx, NAD; Spirometry shows mild obstructive dis- must quit all smoking!     Palpit> this was a new complaint 1/15> w/u DrArida was neg (2DEcho, Cath) & symptoms improved w/ Metoprolol- asked to STOP SMOKING &  avoid caffeine etc; Care Everywhere shows 30d Holter w/ PACs/ PVCs/ no AFib...    Cerebrovasc dis/ Aneurysm> on ASA325, Plavix75; she sees DrMorris IR at [facility] 8/12 w/ CTA that looked good- no resid aneurysm, distal flow appeared good & he didn't rec any changes; he requested that we do her f/u CTA & MRI Brain in [place] 9/14=> good flow thru the stented basilar art, no recurrent aneurysm, no recurrent acoustic neuroma;  Then she was [facility] 2/15 w/ TIA & MRI/MRA Brain showed acute infarct in right occipital lobe, otherw stable;  Subsequent f/u by [facility] Neurology- no change in meds, rec quit smoking and control of BP, BS, Chol...    Hyperlipid> on Prav40; tol well & FLP 11/16 shows TChol 140, TG 112, HDL 43, LDL 75; continue same + diet...    Hypothyroid> on Levothy75 now; labs 11/15 shows TSH= 9.91 and she was rec to increase Synthroid back to 34mcg/d; Labs 11/16 showed TSH=0.22, continue same for now...    DJD/ FM> she is managing quite well w/ prn Tramadol & Tylenol; prev CWP resolved...    Osteopenia> she had min osteopenia on BMD several yrs ago & takes Calcium, MVI, Vit D 1000u daily; f/u BMD 2/15 showed TScores -0.6 in Spine, and -1.6 in right FemNeck; rec to stay on supplements & wt bearing exercise...    RLS> on Mirapex 0.25mg  Qhs as needed...     Acoustic Neuroma> resected by DrBrowne [facility] 2002; MRI 2/11 continued to look good- post op changes, no recurrent tumor seen; f/u MRI here 9/14 (& [facility] 2/15) continues to look good- no evid for recurrent or resid tumor.    Headaches> s/p eval by Neuro, DrJaffe- 9/16 note reviewed, MRI w/o acute changes, Cspine spondy noted w/ some spinal stenosis, treated w/ Pred taper & improved...    Anxiety> on Tranxene as needed... EXAM shows Afeb, VSS, O2sat=100% on RA;  HEENT- neg, mallampati1;  Chest- clear w/o w/r/r;  Heart- RR w/o m/r/g;  Abd- soft, nontender, neg;  Ext- VI w/o c/c/e;  Neuro- no focal neuro deficits...  LAB 10/20/15:  TSH=0.48 on  Synthroid 105mcg/d- continue same... IMP/PLAN>>  Mult medical issues as noted; Dream is still smoking although she has cut down to 1-2cig/d she is reminded of the need to quit completely; REC to continue same meds regularly; she is sched for shots in her back next week... We plan ROV w/ CXR & fasting labs in 66mo.  ~  April 24, 2016:  27mo ROV & pulm/ medical follow up visit>  Vernia is still smoking 2-4 cig/d, denies cough, sput, SOB, but not doing any exercise (some yard work/ walking & no difficulties reported;  He only issue is occas bronchitic infection- usually Rx w/ Levaquin & resolves;  She denies CP, get rare palpit, no edema, etc;  She has known cerebrovasc dis/ aneurysm & followed by [facility] for this, her hx acoustic neuroma, and chr HAs- Tramadol helps but nothing else (he tried 5 diff meds w/o benefit).Marland KitchenMarland Kitchen  As noted- she is still smoking but denies breathing problems, no on meds but strongly urged to quit all smoking & offered smoking cessation counseling but declines...   She remains on Prav40 + diet for Chol and FLP today shows TChol 169, TG 158, HDL 46, LDL 91...    Hx hypothyroidism on Synthroid26mcg/d but TSH=0.20 & we decr dose today to 77mcg/d...    She saw Neuro, DrJaffe last 03/31/16> extensive note reviewed- hx basilar tip aneurysm- s/p coiling 2010, right vestib schwannoma- s/presection 2002, right cerebelar infact, recurrent TIAs, cervical spinal stenosis, tension-type HAs; she has seen DrBotero & had nerve root injections for LBP... EXAM shows Afeb, VSS, O2sat=99% on RA;  HEENT- neg, mallampati1;  Chest- clear w/o w/r/r;  Heart- RR w/o m/r/g;  Abd- soft, nontender, neg;  Ext- VI w/o c/c/e;  Neuro- no focal neuro deficits...  LABS 04/24/16>  FLP- Chol ok x TG=158;  Chems- ok w/ BS=81, Cr=0.83;  CBC- wnl;  TSH=0.20 on Synthroid75;  VitD=56...  IMP/PLAN>>  Emmagene has mult medica issues and continues to smoke- refusing all smoking cessation help;  Labs ok today x low TSH & we  will dec her Synthroid from63mcg/d to 31mcg/d w/ f/u lab on return;  She is due PREVNAR-13 vaccine;  Her CC is still her chr daily HAs and we discussed trial Tranxene 7.5mg Bid to see if this helps (w/ freq & severity of the HAs) and Tramadol 50mg  prn- monitor use...          Problem List:   COPD (ICD-496) - she has a min smoker's cough, sm amt beige sputum... ~  CXR 5/08 w/ chr changes, LLL scar, NAD. ~  CXR 7/09 showed norm hrt size, clear lungs, NAD. ~  CXR 9/10 showed NAD. ~  CXR 10/11 showed chr changes, NAD. ~  CXR 12/12 showed COPD, NAD. ~  CXR 4/13 showed normal heart size, incr interstitial markings, NAD. ~  CXR 1/15 showed norm heart size, COPD/Emphysema w/ scarring in lingula, NAD. ~  CXR 2/16 showed norm heart size, linear atx vs scarring left base, otherw neg- NAD ~  Spirometry 04/22/15>  FVC=2.46 (97%), FEV1=1.69 (88%), %1sec=69%, mid-flows reduced at 64% predicted;  This is c/w mild airflow obstruction and GOLD Stage 1 COPD  CIGARETTE SMOKER (ICD-305.1) - can't vs won't quit and not interested in smoking cessation help... now she states she's decr to ~1/4-1/2 ppd but can't seem to improve from there... ~  She understands the critical need to quit smoking from the COPD/Pulm, Cardiac, & Vascular/Stroke standpoints;  She declines offers for smoking cessation help, chantix, nicotine replacement, etc... ~  2/15:  She was hosp at [facility] w/ Diplopia & found to have a right occipital stroke=> she has decr smoking to <1cig/d & encouraged to quit completely!!! ~  5/15 to 11/15:  She is still smoking 3-4cig/d she says & I have begged her to quit, discussed nicotine replacement rx & alternatives... ~  2/16: she is still smoking <1/2ppd she says but again declines smoking cessation help... ~  5/17: she has decr to 1-2 cig/d she says, reminded of the need to quit completely!  ?MITRAL VALVE PROLAPSE (ICD-424.0) >> mild MVP seen on cath 1995 by DrStuckey... Hx of CHEST PAIN (ICD-786.50)  >> she had CP in the past and a neg cath 1995 (min coronary irregularities)...  Hx PALPITATIONS >> PVCs and PACs seen on EKG & Holter monitor in 2015; asked to stop all caffeine & nicotine... ~  CP eval 1995  w/ cath showing min 10% luminal irreg RCA & mild MVP, norm LVF... ~  [place] was neg- no ischemia, no infarct, EF=66%... ~  baseline EKG showed NSR, NSSTTWA (poor R prog V1-V3)... ~  EKG 1/15 showed NSR, rate60, poor R prog V1-3, NSSTTWA, NAD... ~  2DEcho 2/15 showed normal LV size & function w/ EF=55-60%, no regional wall motion abnormalities, Gr1DD, norm valves and RV ~  Holter Monitor 2/15 showed NSR, PACs, PVCs, and no pathologic arrhythmias (the skips correspond to her subjective SOB/palpit)...  ~  She had another 30d Holter per [facility] w/ Care Everywhere report indicating PACs & PVCs, no AFib... ~  EKG 2/16 showed NSR, rate80, PVCs noted, poor R prog V1=>3, NSSTTWA... She noted incr palpit & CP => referred to Cards for their review... ~  Seen by DrArida 2/16> Cath showed minor luminal irregularities w/o obstructive CAD, norm LVF w/ EF=55%, no signif MR; symptoms felt to be due to PVCs & Rx w/ Metoprolol improved her symptoms...  ~  2DEcho 3/16 showed norm LV size & function w/ EF=65-70%, no regional wall motion abn, Gr1DD, norm valves, norm RV & PAsys...  CEREBROVASCULAR DISEASE (ICD-437.9), & ANEURYSM (ICD-442.9) - found to have a 4-91mm basilar tip aneurysm on MRI Br 10/10... referred to [facility] w/ Angiogram confirmation & subseq stent assisted coiling of the aneurysm 11/10... subseq f/u angiogram 2/11 showed complete angiographic obliteration of the aneurysm, & patent stent in distal basilar art (there was a new 50%stenosis in the right PCA)... on ASA 325mg /d & PLAVIX restarted 2/11 w/ new gait abn & cbll infarct on MRI. ~  she saw DrMorris [facility]- interventional neuroradiology- w/ another arteriogram performed 8/11- it showed satis appearance of the basilar aneurysm stent &  coiling w/ some prolapse of the coils into the P1 segm w/ mild compromise of the post cerebral art (flow looks adeq & he rec continued ASA/ Plavix therapy & f/u 85yr). ~  She had f/u DrMorris 8/12 w/ CTA that looked good- no resid aneurysm, distal flow appeared good & he didn't rec any changes- f/u planned 33yrs. ~  7/14: she is due for her f/u CTA & we will call [facility] regarding a follow up appt w/ DrMorris.. ~  9/14:  on [id number], Plavix75; last f/u DrMorris IR at [facility] 8/12 w/ CTA that looked good- no resid aneurysm, distal flow appeared good & he didn't rec any changes; he requested that we do her f/u CTA & MRI Brain here in [place] 9/14=> good flow thru the stented basilar art, no recurrent aneurysm, no recurrent acoustic neuroma...  ~  CTA Head 9/14 showed s/p stent assisted coiling of basilar tip aneurysm, no aneurysm recurrence, patent basilar art & left post cerebral art, mild atherosclerotic calcif in cavernous carotids w/o stenosis, post op right mastoid changes w/o recurrent tumor. ~  2/15: Adm to [facility] w/ Dip[lopia & eval revealed right occipital lobe infarct, everything else was stable, continued on her ASA325/ Plavix75=> we will refer to [facility] for their review of her situation... ~  3/15: she had a busy March> f/u w/ Neuro in [place] then had another TIA & presented to the ER; CTA Head & Neck showed similar to prev, no recurrent aneurysm, & they disch her on same ASA325 + Plavix75... ~  9/15: she had f/u DrHusseini at [facility], note reviewed on Care Everywhere- felt to be stable & no changes made> rec to quit smoking & control BP, BS, Chol; they released her to f/u  prn... ~  11/15: pt upset that Neuro didn't address her c/o temporal HAs, falling, weak spells- we will refer to [place] [facility] per her request...  ~  2/16: pt seen by DrJaffe> Hx basilar tip aneurysm s/p coiling 2010, right cbll infarct, right vestib schwannoma s/p surg 2002, recurrent TIA, & HAs; they reied Gabapentin=> HA improved  but she developed CP & thought it was from this med therefore stopped it; she was advised to try MelatoninQhs & limit her Tramadol rx, f/u 82mo...  VENOUS INSUFFICIENCY (ICD-459.81) - she tells me that she saw DrKrush for vein surgery and treatment ("it's covered by my husb's insurance").  HYPERLIPIDEMIA (ICD-272.4) - now on PRAVASTATIN 40mg /d & tol well... we reviewed low chol/ low fat diet. ~  chart review shows TChol ~ 200 range in the 80's and early 90's. ~  then TChol incr to ~250 range in late 90's and Lipitor started-  ~  FLP 5/08 on Lipitor 10mg /d showed TChol 164, TG 153, HDL 45, LDL 89. ~  in 2009 c/o no energy & arm discomfort she felt was from the Lip10- therefore [place]. ~  [place] 7/09 on diet alone showed TChol 234, TG 218, HDL 40, LDL 139... rec- start Prav40. ~  FLP 3/10 on Prav40 showed TChol 182, Tg 137, HDL 47, LDL 107 ~  FLP 6/11 on Prav40 showed TChol 162, TG 230, HDL 37, LDL 88 ~  FLP 6/12 on Prav40 showed TChol 177, TG 139, HDL 44, LDL 105... Contin diet, take med every day. ~  FLP 6/13 on Prav40 showed TChol 151, TG 138, HDL 42, LDL 81  ~  FLP 7/14 on Prav40 showed TChol 174, TG 68, HDL 58, LDL 103 ~  FLP 5/15 on Prav40 showed TChol 164, TG 120, HDL 49, LDL 91 ~  FLP 11/15 on Prav40 showed TChol 168, TG 129, HDL 43, LDL 99  ~  FLP 11/16 on Prav40 showed TChol 140, TG 112, HDL 43, LDL 75  HYPOTHYROIDISM (ICD-244.9) - stable on LEVOTHYROID 80mcg/d now... hx Hashimoto's thyroiditis in 1989 w/ markedly pos antimicrosomal antibodies and transient hyperthy labs...  ~  labs 5/08 showed TSH= 0.53... rec- continue Levo100/d. ~  labs 7/09 showed TSH= 0.07... rec- decr Levothy100 to 1/2 daily. ~  labs 3/10 showed TSH= 17.71... rec> incr back to 119mcg/d. ~  labs 9/10 showed TSH= 0.21... continue same. ~  labs 6/11 showed TSH= 0.10... rec decr Levo100 to 1/2 daily. ~  labs 10/11 on Levo50 showed TSH= 1.25 (FreeT3 & FreeT4 normal) ~  Labs 6/12 on Levo50 showed TSH= 12.56 ==> we will  contact pt & pharm to determine if taking Levo100-1/2tab daily every day? & adjust. ~  Labs 12/12 on Levo75 showed TSH= 0.88... Continue same. ~  Labs 6/13 on Levo75 showed TSH= 0.71... Continue same. ~  Labs 7/14 on Levo75 showed TSH= 2.47 ~  Labs 5/15 on Levo75 showed TSH= 0.25 and we rec decr to Levothy50... ~  Labs 11/15 on Levo50 showed TSH= 9.91 and she is rec to go back on Synthroid75 ~  Labs 11/16 on Levo75 showed TSH=0.22 and rec to continue same for now... ~  Labs 5/17 on Levo75 showed TSH= 0.48  IRRITABLE BOWEL SYNDROME (ICD-564.1) - had colonoscopy by [facility] 11/06 that was WNL.Marland Kitchen. she notes some constipation & Rx w/ colase/ senakot-S OTC...  NEPHROLITHIASIS (ICD-592.0) - last stone passed in 1993, no known recurrence since then...  Hx of TENDINITIS, RIGHT ELBOW (ICD-727.09) Hx right shoulder pain w/ eval by  DrNorris (MRI was planned butnever done & symptoms improved spont)... FIBROMYALGIA (ICD-729.1) - prev severe symptoms may have reflected a flair in her FM> but much improved on Pred Rx. ~  labs 9/10 showed Sed= 24 ~  labs 6/11 showed Sed= 30 ~  labs 10/11 showed CBC=norm, Chems=norm, TFTs=norm, Sed=21, RF=neg, ANA+1:40 speckled... ~  12/11:  pt saw DrTruslow & his note is pending- she indicates he stopped her Pred, & gave her shot in shoulder.  OSTEOPENIA (ICD-733.90) - BMD here 7/09 showed TScores -0.8 in Spine, and -1.2 in right FemNeck... rec to take Calcium, MVI, Vit D... ~  BMD repeated 2/15 & showed TScores -0.6 in Spine, and -1.6 in right FemNeck; rec to stay on Calcium, MVI, VitD, & wt bearing exercise...  VITAMIN D DEFICIENCY (ICD-268.9) ~  labs 7/09 showed Vit D level = 16... rec- start Vit D 50K weekly. ~  labs 3/10 showed Vit D level = 69... rec> change to 1000 u daily. ~  labs 6/11 showed Vit D level = 55... Continue same. ~  Labs 6/13 showed Vit D level = 53... Continue same. ~  Labs 7/14 showed Vit D level = 67  Hx of ACOUSTIC NEUROMA (ICD-225.1) - s/p  surg for this tumor 10/02 at [facility] DrBrowne... no known recurrence... ~  MRI Brain 2/07 showed s/p translabyrinthine approach to right acoustic neuroma w/o recurrence, NAD... 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 ~  MRI Brain 2/11 showed no recurrence/ post op changes, endovasc oblit of basilar aneurysm, cerebellar lacunar infarct & 50% stenosis of right PCA... PLAVIX restarted... she had review by [facility] Neurology (as above)... ~  We discussed f/u MRI since DrBrowne hasn't seen her since 2011 (?he turned her over to DrMorris)... ~  9/14:  MRI Brain showed no evid of resid or recurrent vestib schwannoma on the right, post surg changes w/ scarring in the int auditory canal, ols sm vessel cerebellar infarctions are stable, mild atrophy & sm vessel dis, prev coiled basilar tip aneurysm, no acute insults...  ~  MRI/ MRA Brain 2/15 at [facility] showed acute infarct in right occipital lobe, chronic infarcts in cerebellum bilat, aneurysm coiling of basilar art tip and stent in basilar art.. ~  CT Angio Head & Neck 3/15 in [place] showed 40% narrowing of prox left subclavian art; 40% RICAstenosis & [id number] LICAstenosis; aneurysm coiling & stenting of basilar tip aneurysm...  RLS symptoms >> she complained 1/14 about new onset RLS symptoms- discussed trial Mirapex 0.25mg  prn...  ANXIETY (ICD-300.00) - uses CHLORAZEPATE 7.5mg Tid... she gets claustrophobic and needs sedation before MRIs.   Past Surgical History:  Procedure Laterality Date  . ANEURYSM COILING  11-10   Dr. Estanislado Pandy  . BREAST LUMPECTOMY    . LEFT HEART CATHETERIZATION WITH CORONARY ANGIOGRAM N/A 08/05/2014   Procedure: LEFT HEART CATHETERIZATION WITH CORONARY ANGIOGRAM;  Surgeon: Wellington Hampshire, MD;  Location: [facility] CATH [facility];  Service: Cardiovascular;  Laterality: N/A;  . TOTAL ABDOMINAL HYSTERECTOMY    . TRANSLABYRINTHINE PROCEDURE  2002   [facility] Dr. Vicie Mutters    Outpatient  Encounter Prescriptions as of 04/24/2016  Medication Sig  . acetaminophen (TYLENOL) 500 MG tablet Take 500 mg by mouth every 6 (six) hours as needed for mild pain.   Marland Kitchen aspirin 325 MG tablet Take 325 mg by mouth daily.    . calcium gluconate 500 MG tablet Take 1 tablet by mouth daily.  Marland Kitchen  cholecalciferol (VITAMIN D) 1000 UNITS tablet Take 1,000 Units by mouth daily.    . clopidogrel (PLAVIX) 75 MG tablet Take 1 tablet (75 mg total) by mouth daily.  . clorazepate (TRANXENE) 7.5 MG tablet Take 1 tablet (7.5 mg total) by mouth 3 (three) times daily as needed for anxiety.  . hydroxypropyl methylcellulose / hypromellose (ISOPTO TEARS / GONIOVISC) 2.5 % ophthalmic solution Place 1 drop into both eyes as needed for dry eyes.  Marland Kitchen levothyroxine (SYNTHROID, LEVOTHROID) 75 MCG tablet TAKE 1 TABLET DAILY BEFORE BREAKFAST  . metoprolol tartrate (LOPRESSOR) 25 MG tablet TAKE 1 TABLET TWICE A DAY  . pramipexole (MIRAPEX) 0.25 MG tablet TAKE 1 TABLET AT BEDTIME FOR RESTLESS LEG SYMPTOMS  . pravastatin (PRAVACHOL) 40 MG tablet TAKE 1 TABLET DAILY  . senna-docusate (SENOKOT-S) 8.6-50 MG per tablet Take 1 tablet by mouth at bedtime.   . [DISCONTINUED] clorazepate (TRANXENE) 7.5 MG tablet Take 1 tablet (7.5 mg total) by mouth 3 (three) times daily as needed for anxiety.  . [DISCONTINUED] nortriptyline (PAMELOR) 10 MG capsule Take 1 capsule (10 mg total) by mouth at bedtime. (Patient not taking: Reported on 04/24/2016)   No facility-administered encounter medications on file as of 04/24/2016.     Allergies  Allergen Reactions  . Atorvastatin Other (See Comments)     Lipitor caused arm pain (3/09)  . Hydrocodone-Acetaminophen Other (See Comments)    hallucinations  . Morphine Other (See Comments)    hallucinations  . Nortriptyline     Caused nausea and vomiting and shaking, kept her up all night  . Topamax [Topiramate] Nausea And Vomiting    *dizziness*    Current Medications, Allergies, Past Medical  History, Past Surgical History, Family History, and Social History were reviewed in [employer] record.   Review of Systems         See HPI - all other systems neg except as noted... The patient complains of dyspnea on exertion, recent palpitations & HAs.  The patient denies anorexia, fever, weight loss, weight gain, vision loss, decreased hearing, hoarseness, chest pain, syncope, peripheral edema, prolonged cough, hemoptysis, abdominal pain, melena, hematochezia, severe indigestion/heartburn, hematuria, incontinence, muscle weakness, suspicious skin lesions, transient blindness, difficulty walking, depression, unusual weight change, abnormal bleeding, enlarged lymph nodes, and angioedema.     Objective:   Physical Exam     WD, WN, 75 y/o WF in NAD... GENERAL:  Alert & oriented; pleasant & cooperative... HEENT:  [place]/AT, EOM-wnl, PERRLA, EACs-clear, TMs- some scarring on right, NOSE-clear, THROAT-clear & wnl. NECK:  Supple w/ fairROM; no JVD; normal carotid impulses w/o bruits; palp thyroid, no nodules felt; no lymphadenopathy. CHEST:  Clear to P & A; without wheezes/ rales/ or rhonchi heard... HEART:  Regular Rhythm; gr1/6 SEM,  no irregularity, rubs or gallops heard... ABDOMEN:  Soft & nontender; normal bowel sounds; no organomegaly or masses detected. EXT: without deformities, mild arthritic changes; no varicose veins/ venous insuffic/ or edema... +trigger points about the neck/ shoulders w/ decr ROM right shoulder w/ pain... NEURO:  CN's intact; no focal neuro deficits... gait & station are normal. DERM:  No lesions noted; no rash etc...  RADIOLOGY DATA:  Reviewed in the EPIC EMR & discussed w/ the patient...  LABORATORY DATA:  Reviewed in the EPIC EMR & discussed w/ the patient...   Assessment & Plan:    COPD/ Smoker>  She notes min cough/ sputum/ etc; desperately needs to quit smokng- offered counseling, medications, etc but she declines...  Hx mild MVP>  No MVP seen on 2DEcho 2/15, mild MVP was noted on cath 1995 by DrStuckey...  PALPITATIONS>> Prev eval w/ CXR, EKG, 2DEcho & Holter monitor=> all studies OK/NEG as above (x PACs/ PVCs); we stressed the importance of no nicotine, no caffeine, etc... 2/16> she presented w/ incr symptoms over the last month; rec to start Metop25- 1/2Bid & refer to Cards for their review... 2/16> seen by DrArida w/ Cath, repeat 2DEcho- both neg & palpit/ PVCs improved w/ Metoprolol rx...  Hx right sided Acoustic Neuroma- s/p surg 2002 at [facility] Dr[facility]row[facility]> no known recurrence to date including MRI 9/14...  CEREBROVASC Dis w/ basilar tip aneurysm stent & coiling 11/10 by DrDeveshwar; f/u arteriogram 2/11 at [facility] & 8/11 at [facility] showed some compromise of the post circ w/ prolapse of the coils into the P1 segm;  Note: MRI 2/11 done for new gait abn showed cbll infarct & ASA/ Plavix restarted and she did home phys therapy w/ improvement;  Repeat IR eval 8/12 w/ CTA showed oblit of the aneurysm & distal flow appears good as well- they rec repeat studies in 2 yrs=> Done 9/14 in [place] & no aneurysm seen, stent ok w/ good flow thru the area, felt to be stable... Right Occipital Lobe Infarct 2/15>  As above, she remains on ASA325 & Plavix75, we will refer to [facility] Neurology division for their recommendations... Another TIA 3/15>  eval via ER w/ CTA Head & Neck- stable, no changes made to meds- rec to continue ASA/ Plavix, AND QUIT SMOKING.... She was released by [facility] Neuro- DrHusseini & asked to quit smoking, continue ASA/Plavix, keep BP/ BS/ Chol under control... 11/15> pt c/o HAs, falls, weakness & [facility]... 2/16> seen by [facility] for HAs & neuro 2nd opinion...  HYPERLIPID>  On Prav40 + diet;  FLP looks OK but needs better diet & take med Qhs...  HYPOTHYROID>  Back on Levothy 56mcg/d now w/ TSH= 9.91 last OV on 51mcg/d...  IBS w/ Constip>  She uses OTC laxatives as needed & we discussed Miralax/ Senakot-S/  etc...  ORTHO>  Prev right shoulder discomfort improved spont & MRI was never done; currently uses Tramadol Prn... ~  2017> c/o neck & back pain; DrJaffe [facility] & he has sched back injections...  OSTEOPENIA/ Vit D defic>  Stable on Calcium, MVI, Vit D supplement; due for f/u BMD=> pending...  RLS symptoms>  trial Mirapex 0.25mg  hs prn...  Hx Chronic daily HAs>>  HAs improved spontaneously- on Tramadol & Tylenol w/ some relief of pain; she tried Topamax but intol w/ dizzy & nausea... 11/15> presented w/ incr HAs and referred to Neuro; they tried Neurontin w/ decr HAs but intol she says...  ANXIETY>  Uses Tranxene Prn (she has some claustrophobia & needs sedation before MRIs- she doesn't remember the 2/11 MRI being done!).Marland KitchenMarland Kitchen   Patient's Medications  New Prescriptions   No medications on file  Previous Medications   ACETAMINOPHEN (TYLENOL) 500 MG TABLET    Take 500 mg by mouth every 6 (six) hours as needed for mild pain.    ASPIRIN 325 MG TABLET    Take 325 mg by mouth daily.     CALCIUM GLUCONATE 500 MG TABLET    Take 1 tablet by mouth daily.   CHOLECALCIFEROL (VITAMIN D) 1000 UNITS TABLET    Take 1,000 Units by mouth daily.     CLOPIDOGREL (PLAVIX) 75 MG TABLET    Take 1 tablet (75 mg total) by mouth  daily.   HYDROXYPROPYL METHYLCELLULOSE / HYPROMELLOSE (ISOPTO TEARS / GONIOVISC) 2.5 % OPHTHALMIC SOLUTION    Place 1 drop into both eyes as needed for dry eyes.   LEVOTHYROXINE (SYNTHROID, LEVOTHROID) 75 MCG TABLET    TAKE 1 TABLET DAILY BEFORE BREAKFAST   METOPROLOL TARTRATE (LOPRESSOR) 25 MG TABLET    TAKE 1 TABLET TWICE A DAY   PRAMIPEXOLE (MIRAPEX) 0.25 MG TABLET    TAKE 1 TABLET AT BEDTIME FOR RESTLESS LEG SYMPTOMS   PRAVASTATIN (PRAVACHOL) 40 MG TABLET    TAKE 1 TABLET DAILY   SENNA-DOCUSATE (SENOKOT-S) 8.6-50 MG PER TABLET    Take 1 tablet by mouth at bedtime.   Modified Medications   Modified Medication Previous Medication   CLORAZEPATE (TRANXENE) 7.5 MG TABLET  clorazepate (TRANXENE) 7.5 MG tablet      Take 1 tablet (7.5 mg total) by mouth 3 (three) times daily as needed for anxiety.    Take 1 tablet (7.5 mg total) by mouth 3 (three) times daily as needed for anxiety.  Discontinued Medications   NORTRIPTYLINE (PAMELOR) 10 MG CAPSULE    Take 1 capsule (10 mg total) by mouth at bedtime.

## 2016-05-01 ENCOUNTER — Other Ambulatory Visit: Payer: Self-pay | Admitting: Pulmonary Disease

## 2016-05-01 MED ORDER — LEVOTHYROXINE SODIUM 50 MCG PO TABS: 50.0000 ug | ORAL_TABLET | Freq: Every day | ORAL | 3 refills | Status: DC

## 2016-05-07 ENCOUNTER — Other Ambulatory Visit: Payer: Self-pay | Admitting: Pulmonary Disease

## 2016-05-16 ENCOUNTER — Telehealth: Payer: Self-pay | Admitting: Pulmonary Disease

## 2016-05-16 MED ORDER — LEVOTHYROXINE SODIUM 50 MCG PO TABS: 50.0000 ug | ORAL_TABLET | Freq: Every day | ORAL | 3 refills | Status: DC

## 2016-05-16 NOTE — Telephone Encounter (Signed)
Spoke with pt. And she wanted [facility] I see that Leigh printed a rx on 05/01/2016. I will forward to her to see where she sent this rx and follow up with the pt.

## 2016-05-16 NOTE — Telephone Encounter (Signed)
Called and spoke with pt and she is aware of rx for the synthroid to the mail order pharmacy.

## 2016-07-13 ENCOUNTER — Ambulatory Visit: Payer: Medicare Other | Admitting: Neurology

## 2016-07-17 ENCOUNTER — Telehealth: Payer: Self-pay | Admitting: Pulmonary Disease

## 2016-07-17 MED ORDER — TRAMADOL HCL 50 MG PO TABS: 50.0000 mg | ORAL_TABLET | Freq: Three times a day (TID) | ORAL | 0 refills | Status: DC | PRN

## 2016-07-17 NOTE — Telephone Encounter (Signed)
Called and spoke to pt. Pt requesting refill of Tramadol sent to [facility]. Per Leigh, CMA, ok per SN. Rx has been called into [facility].  Pt verbalized understanding and denied any further questions or concerns at this time.

## 2016-07-19 ENCOUNTER — Telehealth: Payer: Self-pay | Admitting: Pulmonary Disease

## 2016-07-19 NOTE — Telephone Encounter (Signed)
[facility]ed [facility] and [facility] close at 4:30.  Will have to call back tomorrow to let them know that it is ok to refill the tramadol for #135 per SN and the pt.

## 2016-07-19 NOTE — Telephone Encounter (Signed)
Spoke with Marzetta Board at [facility]. States that they received the pt's Tramadol prescription on 07/17/16. Their records show that the pt last had this filled in August 2017. We wrote her prescription for #270 tablets for a 90 day supply. Marzetta Board stated, "Obviously #270 tablets is lasting the pt longer than 90 days and with the opioid epidemic we don't want these extra pills to fall in to the wrong hands." [facility] is wanting the quantity changed to #135 tablets.  SN - please advise if you are okay with this. Thanks.

## 2016-07-20 NOTE — Telephone Encounter (Signed)
Called and spoke with [facility]. They are going to change the pt's quantity to #135 tablets. Nothing further was needed.

## 2016-07-29 ENCOUNTER — Other Ambulatory Visit: Payer: Self-pay | Admitting: Pulmonary Disease

## 2016-10-12 ENCOUNTER — Other Ambulatory Visit: Payer: Self-pay | Admitting: Pulmonary Disease

## 2016-10-23 ENCOUNTER — Encounter: Payer: Self-pay | Admitting: Pulmonary Disease

## 2016-10-23 ENCOUNTER — Other Ambulatory Visit (INDEPENDENT_AMBULATORY_CARE_PROVIDER_SITE_OTHER): Payer: Medicare Other

## 2016-10-23 ENCOUNTER — Ambulatory Visit (INDEPENDENT_AMBULATORY_CARE_PROVIDER_SITE_OTHER): Payer: Medicare Other | Admitting: Pulmonary Disease

## 2016-10-23 VITALS — BP 124/70 | HR 52 | Temp 98.2°F | Ht 62.0 in | Wt 123.5 lb

## 2016-10-23 DIAGNOSIS — G459 Transient cerebral ischemic attack, unspecified: Secondary | ICD-10-CM | POA: Diagnosis not present

## 2016-10-23 DIAGNOSIS — D333 Benign neoplasm of cranial nerves: Secondary | ICD-10-CM

## 2016-10-23 DIAGNOSIS — I679 Cerebrovascular disease, unspecified: Secondary | ICD-10-CM

## 2016-10-23 DIAGNOSIS — E039 Hypothyroidism, unspecified: Secondary | ICD-10-CM

## 2016-10-23 DIAGNOSIS — I6350 Cerebral infarction due to unspecified occlusion or stenosis of unspecified cerebral artery: Secondary | ICD-10-CM

## 2016-10-23 DIAGNOSIS — F411 Generalized anxiety disorder: Secondary | ICD-10-CM | POA: Diagnosis not present

## 2016-10-23 DIAGNOSIS — J449 Chronic obstructive pulmonary disease, unspecified: Secondary | ICD-10-CM

## 2016-10-23 DIAGNOSIS — Z23 Encounter for immunization: Secondary | ICD-10-CM

## 2016-10-23 DIAGNOSIS — F17200 Nicotine dependence, unspecified, uncomplicated: Secondary | ICD-10-CM | POA: Diagnosis not present

## 2016-10-23 LAB — TSH: TSH: 6.28 u[IU]/mL — ABNORMAL HIGH (ref 0.35–4.50)

## 2016-10-23 NOTE — Patient Instructions (Signed)
Today we updated your med list in our EPIC system...    Continue your current medications the same...  Today we checked a follow up thyroid blood test...    We will contact you w/ the results when available...   We also gave you the 2nd of the pneumonia vaccines- the PREVNAR-13 shot ...  Keep up the good work w/ diet & exercise...    But do a better job on smoking cessation!!!  Call for any questions...  Let's plan a follow up visit in 88mo, sooner if needed for problems.Marland KitchenMarland Kitchen

## 2016-10-23 NOTE — Progress Notes (Signed)
Subjective:    Patient ID: Ana Santos, female    DOB: 1940-10-09, 76 y.o.   MRN: [id number]  HPI 76 y/o WF here for a follow up visit... she has multiple medical problems as reviewed below...   ~  SEE PREV EPIC NOTES FOR EARLIER DATA >>   LABS 6/13:  FLP- at goals on Prav40;  Chems- wnl;  CBC- wnl;  TSH=0.71;  VitD=53...  CXR 7/14> she forgot to go to the [place] for this film...   LABS 7/14:  FLP- looks ok on Prav40;  Chems- wnl;  CBC- wnl;  TSH=2.47;  VitD=67... ADDENDUM> DrMorris ([facility], Interventional radiology) insists that we obtain her f/u CTAngiogram & send it to him, we will also obtain f/u MRI Brain to assess her acoustic neuroma...  CTA Head 9/14 showed s/p stent assisted coiling of basilar tip aneurysm, no aneurysm recurrence, patent basilar art & left post cerebral art, mild atherosclerotic calcif in cavernous carotids w/o stenosis, post op right mastoid changes w/o recurrent tumor  MRI Brain 9/14 showed no evid of resid or recurrent vestib schwannoma on the right, post surg changes w/ scarring in the int auditory canal, ols sm vessel cerebellar infarctions are stable, mild atrophy & sm vessel dis, prev coiled basilar tip aneurysm, no acute insults... ADDENDUM> Letter from DrMorris 9/14 after review of the CTA indicates that everything is stable and flow is satisfactory (he is leaving [place] can assist in the future if needed).  CXR 1/15 showed norm heart size, COPD/Emphysema w/ scarring in lingula, NAD.Marland KitchenMarland Kitchen  EKG 1/15 showed NSR, rate60, poor R prog V1-3, NSSTTWA, NAD...  2DEcho 2/15 showed normal LV size & function w/ EF=55-60%, no regional wall motion abnormalities, Gr1DD, norm valves and RV...  Holter Monitor 2/15 showed NSR, PACs, PVCs, and no pathologic arrhythmias (the skips correspond to her subjective SOB/palpit)...  ~   August 12, 2013:  add-on visit post hospital check>  Ana Santos developed diplopia & blurry vision and was adm to [facility] 2/15 -  07/30/13> review of records indicated that her neuro exam was otherw WNL, MRI Brain showed a right occipital lobe infarct, and MRA showed no new abnormality- no recurrent aneurysm, patent sent & good flow in the PCA; symptom resolved spont & they continued her same meds (YYT035 & Plavix75); she has been able to cut down to 1cig/d and again we reviewed the critical need to quit smoking completely... CT Brain 07/27/13 [facility] showed prior basilar tip aneurysm repair, post surg changes in the right mastoid region, NAD...  MRI Brain 2/15 showed sm areas of acute infarct in right occipital lobe (new from 9/14 MRI); chr infarcts in the cerebellum bilat, basilar art stent & coiling of basilar tip aneurysm...  MRA Brain 2/15 showed no evid for recurrent basilar tip aneurysm, mild to mod narrowing of the right P1 segment, left P1 segment stented...  CDopplers 2/15 showed some plaque in the bulbs and ICAs bilat, <50% stenoses bilat noted...   EKG 2/15 showed NSR, rate63, poor R prog V1-3, NAD...  LABS 2/15 at [facility] showed> Chems- wnl w/ Cr=1.0;  CBC- wnl w/ Hg=13.5;  Abn UA w/ UTI evident...  ~  Ana Santos went to the ER 08/26/13 with another TIA characterized by diplopia & ataxia that last 70min or so; assoc mild HA, no focal neuro findings in ER; already on ASA325/ Plavix75 w/ good compliance; She denies any additional cerebral ischemic symptoms since the 3/15 ER visit; unfortunately still smoking; review of  chart shows NO documented AFib in her past...  EKG 3/15 showed NSR, rate67, poor R prog V1-3, otherw wnl...  CT Head 3/15 showed evid prior vertebral art aneurysm surg & post-op changes in right mastoid, NAD...  CTA Head & Neck 3/15 showed prev basilar tip aneurysm coiling & basilar stent, prior right mastoidectomy, vessels are patent & no recurrent aneurysm; neck showed apical scarring & emphysema, 40% left subclav stenosis, 40-50% bilat ICA stenoses, mild stenosis at the origin of the  vertebral as well...  LABS 5/15:  FLP- at goals on Prav40;  Chems & LFTs- wnl;  TSH= 0.25 on Levothy75 & rec to decr to 40mcg/d...   ~  April 17, 2014:  85mo ROV & Ana Santos is c/o HA- sharp pains in her temples, assoc w/ occas weak spells ("don't feel like getting OOB"), poor appetite, and occas falling (no known injury); she remains on ASA325 & Plavix75> she voiced discontent w/ Neuro eval at [facility], states she never received call from her doctor (Neuro- DrHusseini);  I reviewed Care Everywhere records- summary & last note 03/11/14 from DrHusseini> extensive note reviewed, pt stable on ASA/ Plavix and no changes made, she was asked to f/u prn;  Ana Santos's BP, Chol, BS- all well controlled but she is still smoking and can't vs won't quit;  She requests 2nd opinion from local [facility] due to her temporal HAs, falling, weak spells...  We reviewed the following medical problems during today's office visit >>     COPD> still smoking 1/2ppd & can't vs won't quit; not on meds, min cough/ sputum, denies hemoptysis/ SOB/ ch in DOE, etc; CXR 1/15= COPD, NAD; she MUST quit all smoking!    Palpit> new complaint 1/15> w/u in progress & asked to STOP SMOKING & avoid caffeine etc; Care Everywhere shows 30d Holter w/ PACs/ PVCs/ no AFib...    Cerebrovasc dis/ Aneurysm> on ASA325, Plavix75; she sees DrMorris IR at [facility] 8/12 w/ CTA that looked good- no resid aneurysm, distal flow appeared good & he didn't rec any changes; he requested that we do her f/u CTA & MRI Brain in [place] 9/14=> good flow thru the stented basilar art, no recurrent aneurysm, no recurrent acoustic neuroma;  Then she was [facility] at [facility] 2/15 w/ TIA & MRI/MRA Brain showed acute infarct in right occipital lobe, otherw stable;  Subsequent f/u by [facility] Neurology- no change in meds, rec quit smoking and control of BP, BS, Chol...    Hyperlipid> on Prav40; tol well & FLP 11/15 shows TChol 168, TG 129, HDL 43, LDL 99; continue same + diet...    Hypothyroid>  on Levothy50 now; labs 11/15 shows TSH= 9.91 and she is rec to increase Synthroid back to 73mcg/d...    DJD/ FM> she is managing quite well w/ prn Tramadol & Tylenol; prev CWP resolved...    Osteopenia> she had min osteopenia on BMD several yrs ago & takes Calcium, MVI, Vit D 1000u daily; f/u BMD 2/15 showed TScores -0.6 in Spine, and -1.6 in right FemNeck; rec to stay on supplements & wt bearing exercise...    RLS> on Mirapex 0.25mg  Qhs as needed...     Acoustic Neuroma> this was resected by DrBrowne [facility] 2002; MRI 2/11 continued to look good- post op changes, no recurrent tumor seen; f/u MRI here 9/14 (& [facility] 2/15) continues to look good- no evid for recurrent or resid tumor...    Anxiety> on Tranxene as needed... We reviewed prob list, meds, xrays and labs> see  below for updates >> OK Flu shot today...  LABS 11/15:  FLP- at goals on Prav40;  Chems- wnl;  CBC- wnl;  TSH=9.91;  Sed=36... PLAN>>  She desperately need to quit smoking, declines smoking cessation help;  TSH is elev on Synthroid50 & she confirms daily dosing, therefore incr back to Synthroid75;  We will refer to [facility] for 2nd opinion regarding her cerebrovasc dis, HAs, falling, weak spells...   ~  August 03, 2014:  33mo ROV & acute add-on visit requested by Neuro-DrJaffe due to CP & irreg heartbeat> Zakara was at [facility] office today (for f/u of headaches- improved w/ Neurontin but intol she said due to incr palpit) & mentioned left CP & palpit, he noted skips on exam & called for a work-in appt here;  She has a cardiac hx- followed for yrs by DrStuckey w/ cath 1995 showing min nonobstructive CAD & mild MVP; she developed palpit in 2015 w/ abn EKG (poor R prog, NSTTWA)) and 2DEcho showing norm LVF, no regional wall motion abn, Gr1DD, normal valves & RV; Holter monitor revealed NSR w/ some PACs & PVCs but no dangerous arrhythmias- she was asked to avoid caffeine, pseudophed, and QUIT SMOKING (she still smokes ~1/2ppd)... As  noted she has severe cerebrovasc dis & hx Acoustic Neuroma w/ prev gamma knife surg at [facility] in 2002; a 30d Event monitor at [facility] was said to reveal PACs, PVCs, but no AFib... Current symptoms sound similar to prev w/ skipping sensation, no racing, sl SOB, mild chest discomfort on & off daily x 3-4wks (sharp, not sore to touch, sl worse w/ movement); she does not exercise & her most strenuous activity is housework...    We reviewed prob list, meds, xrays and labs> see below for updates >>   CXR 2/16 showed norm heart size, linear atx vs scarring left base, otherw neg- NAD.Marland KitchenMarland Kitchen  EKG today showed NSR, rate80, occ PVCs noted, poor R prog V1=>3, NSSTTWA... PLAN>>  We decided to start METOPROLOL 25mg - 1/2 tab Bid for now & refer to Cards for f/u eval; reminded about smoking cessation but she declines additional counseling; her husb sees DrMcLean...  ~  Oct 20, 2014:  37mo ROV & Treana reports improved after cardiac eval by DrArida (see below)- neg cath, neg 2DEcho, palpit improved w/ Metoprolol & decr caffeine etc; unfortunately she continues to smoke & will not quit, refuses smoking cessation meds etc... She is in the middle of bilat cat surg by DrBevis... No new complaints or concerns- denies cough, sput, hemoptysis, SOB, CP, f/c/s, etc; she states her main exercise is yard work... We reviewed prob list, meds, xrays and labs> see below for updates >>  PLAN>> she reports improved on current med regimen; we again discussed the need for smoking cessation, take meds regularly; she will f/u in 76mo w/ Fasting labs & call in the interim prn...  ~  April 22, 2015:  66mo ROV & Ana Santos has been having some increased HAs- went to see DrJaffe in Neuro w/ f/u MRI/MRA (no acute changes) and treated transiently w/ Pred taper & improved; she has Tramadol/ Tylenol to use as needed;  We reviewed the following medical problems during today's office visit >>     COPD> still smoking 5-6cig/d she says & can't vs won't quit; not on  meds, denies cough/ sputum/ hemoptysis/ SOB/ ch in DOE, etc; CXR 2/16= COPD, mild basilar atx, NAD; Spirometry shows mild obstructive dis- must quit all smoking!     Palpit> this  was a new complaint 1/15> w/u DrArida was neg (2DEcho, Cath) & symptoms improved w/ Metoprolol- asked to STOP SMOKING & avoid caffeine etc; Care Everywhere shows 30d Holter w/ PACs/ PVCs/ no AFib...    Cerebrovasc dis/ Aneurysm> on ASA325, Plavix75; she sees DrMorris IR at [facility] 8/12 w/ CTA that looked good- no resid aneurysm, distal flow appeared good & he didn't rec any changes; he requested that we do her f/u CTA & MRI Brain in [place] 9/14=> good flow thru the stented basilar art, no recurrent aneurysm, no recurrent acoustic neuroma;  Then she was [facility] at [facility] 2/15 w/ TIA & MRI/MRA Brain showed acute infarct in right occipital lobe, otherw stable;  Subsequent f/u by [facility] Neurology- no change in meds, rec quit smoking and control of BP, BS, Chol...    Hyperlipid> on Prav40; tol well & FLP 11/16 shows TChol 140, TG 112, HDL 43, LDL 75; continue same + diet...    Hypothyroid> on Levothy75 now; labs 11/15 shows TSH= 9.91 and she was rec to increase Synthroid back to 46mcg/d; Labs 11/16 showed TSH=0.22, continue same for now...    DJD/ FM> she is managing quite well w/ prn Tramadol & Tylenol; prev CWP resolved...    Osteopenia> she had min osteopenia on BMD several yrs ago & takes Calcium, MVI, Vit D 1000u daily; f/u BMD 2/15 showed TScores -0.6 in Spine, and -1.6 in right FemNeck; rec to stay on supplements & wt bearing exercise...    RLS> on Mirapex 0.25mg  Qhs as needed...     Acoustic Neuroma> resected by DrBrowne [facility] 2002; MRI 2/11 continued to look good- post op changes, no recurrent tumor seen; f/u MRI here 9/14 (& [facility] 2/15) continues to look good- no evid for recurrent or resid tumor.    Headaches> s/p eval by Neuro, DrJaffe- 9/16 note reviewed, MRI w/o acute changes, Cspine spondy noted w/ some spinal stenosis,  treated w/ Pred taper & improved...    Anxiety> on Tranxene as needed... We reviewed prob list, meds, xrays and labs> OK 2016 FLU vaccine today...  MRI/MRA 02/22/15>  No acute infarct, tiny cbll blood breakdown products unchanged w/o new areas of intracranial hemorrhage detected, remote right cbll infarcts & sm vessel dis, postop right vestib schwannoma surg- no acute changes, cerv spondylosis w/ sp stenosis C3-4 & C4-5, s/p coiling basilar tip aneurysm, mild irregularity of basilar art, mod narrowing of P1 segm left p[ost cerebral art (mild narrowing on right)...   Spirometry 04/22/15>  FVC=2.46 (97%), FEV1=1.69 (88%), %1sec=69%, mid-flows reduced at 64% predicted;  This is c/w mild airflow obstruction and GOLD Stage 1 COPD  LABS 04/22/15>  FLP- at goals on Prav40;  Chems- wnl;  CBC- wnl;  TSH= 0.22 on Synth75;  VitD= 48 on 1000u/d... IMP/PLAN>>  Ana Santos is stable overall w/ her mult serious medical issues;  We again discussed smoking cessation;  She continues to do alterations as she has done for 50+yrs;  We refilled her Tramadol & Tranxene...  ~  Oct 20, 2015:  66mo ROV & Ana Santos has been stable over the interval, c/o incr pain/ discomfort in neck & back (sched for ?neck injections next week, DrJaffe, DrBotero);  She is still smoking but now down to 2cig/d she says, breathing is OK & she denies cough/ sput/ hemoptysis/ SOB/ or CP;  She has been walking some & doing yard work- says she is NOT limited by her breathing & she has declined breathing meds in the past... We reviewed the  following medical problems during today's office visit >>     COPD> still smoking 1-2cig/d she says & can't vs won't quit; not on meds, denies cough/ sputum/ hemoptysis/ SOB/ ch in DOE, etc; CXR 2/16= COPD, mild basilar atx, NAD; Spirometry shows mild obstructive dis- must quit all smoking!     Palpit> this was a new complaint 1/15> w/u DrArida was neg (2DEcho, Cath) & symptoms improved w/ Metoprolol- asked to STOP SMOKING &  avoid caffeine etc; Care Everywhere shows 30d Holter w/ PACs/ PVCs/ no AFib...    Cerebrovasc dis/ Aneurysm> on ASA325, Plavix75; she sees DrMorris IR at [facility] 8/12 w/ CTA that looked good- no resid aneurysm, distal flow appeared good & he didn't rec any changes; he requested that we do her f/u CTA & MRI Brain in [place] 9/14=> good flow thru the stented basilar art, no recurrent aneurysm, no recurrent acoustic neuroma;  Then she was [facility] 2/15 w/ TIA & MRI/MRA Brain showed acute infarct in right occipital lobe, otherw stable;  Subsequent f/u by [facility] Neurology- no change in meds, rec quit smoking and control of BP, BS, Chol...    Hyperlipid> on Prav40; tol well & FLP 11/16 shows TChol 140, TG 112, HDL 43, LDL 75; continue same + diet...    Hypothyroid> on Levothy75 now; labs 11/15 shows TSH= 9.91 and she was rec to increase Synthroid back to 34mcg/d; Labs 11/16 showed TSH=0.22, continue same for now...    DJD/ FM> she is managing quite well w/ prn Tramadol & Tylenol; prev CWP resolved...    Osteopenia> she had min osteopenia on BMD several yrs ago & takes Calcium, MVI, Vit D 1000u daily; f/u BMD 2/15 showed TScores -0.6 in Spine, and -1.6 in right FemNeck; rec to stay on supplements & wt bearing exercise...    RLS> on Mirapex 0.25mg  Qhs as needed...     Acoustic Neuroma> resected by DrBrowne [facility] 2002; MRI 2/11 continued to look good- post op changes, no recurrent tumor seen; f/u MRI here 9/14 (& [facility] 2/15) continues to look good- no evid for recurrent or resid tumor.    Headaches> s/p eval by Neuro, DrJaffe- 9/16 note reviewed, MRI w/o acute changes, Cspine spondy noted w/ some spinal stenosis, treated w/ Pred taper & improved...    Anxiety> on Tranxene as needed... EXAM shows Afeb, VSS, O2sat=100% on RA;  HEENT- neg, mallampati1;  Chest- clear w/o w/r/r;  Heart- RR w/o m/r/g;  Abd- soft, nontender, neg;  Ext- VI w/o c/c/e;  Neuro- no focal neuro deficits...  LAB 10/20/15:  TSH=0.48 on  Synthroid 105mcg/d- continue same... IMP/PLAN>>  Mult medical issues as noted; Ana Santos is still smoking although she has cut down to 1-2cig/d she is reminded of the need to quit completely; REC to continue same meds regularly; she is sched for shots in her back next week... We plan ROV w/ CXR & fasting labs in 66mo.  ~  April 24, 2016:  27mo ROV & pulm/ medical follow up visit>  Ana Santos is still smoking 2-4 cig/d, denies cough, sput, SOB, but not doing any exercise (some yard work/ walking & no difficulties reported;  He only issue is occas bronchitic infection- usually Rx w/ Levaquin & resolves;  She denies CP, get rare palpit, no edema, etc;  She has known cerebrovasc dis/ aneurysm & followed by [facility] for this, her hx acoustic neuroma, and chr HAs- Tramadol helps but nothing else (he tried 5 diff meds w/o benefit).Marland KitchenMarland Kitchen  As noted- she is still smoking but denies breathing problems, no on meds but strongly urged to quit all smoking & offered smoking cessation counseling but declines...   She remains on Prav40 + diet for Chol and FLP today shows TChol 169, TG 158, HDL 46, LDL 91...    Hx hypothyroidism on Synthroid68mcg/d but TSH=0.20 & we decr dose today to 73mcg/d...    She saw Neuro, DrJaffe last 03/31/16> extensive note reviewed- hx basilar tip aneurysm- s/p coiling 2010, right vestib schwannoma- s/presection 2002, right cerebelar infact, recurrent TIAs, cervical spinal stenosis, tension-type HAs; she has seen DrBotero & had nerve root injections for LBP... EXAM shows Afeb, VSS, O2sat=99% on RA;  HEENT- neg, mallampati1;  Chest- clear w/o w/r/r;  Heart- RR w/o m/r/g;  Abd- soft, nontender, neg;  Ext- VI w/o c/c/e;  Neuro- no focal neuro deficits...  Last CXR was 12/17/15>  Norm heart size, Ao atherosclerosis, hyperinflated w/ flat diaph c/w COPD, scarring left base, NAD...   LABS 04/24/16>  FLP- Chol ok x TG=158;  Chems- ok w/ BS=81, Cr=0.83;  CBC- wnl;  TSH=0.20 on Synthroid75;  VitD=56...   IMP/PLAN>>  Ana Santos has mult medica issues and continues to smoke- refusing all smoking cessation help;  Labs ok today x low TSH & we will dec her Synthroid from96mcg/d to 34mcg/d w/ f/u lab on return;  She is due PREVNAR-13 vaccine;  Her CC is still her chr daily HAs and we discussed trial Tranxene 7.5mg Bid to see if this helps (w/ freq & severity of the HAs) and Tramadol 50mg  prn- monitor use...   ~  Oct 23, 2016:  47mo ROV & Ana Santos reports that she is still smoking 4 cig/d despite all her cerebrovasc dis- again advised to quit all smoking in light of her prev cerebral infarction and severe vasc disease;  She has similarly declined inhaled meds & is proud that she made it thru the winter & spring so far w/o a bronchitic exac or upper resp infection...  We reviewed the following medical problems during today's office visit >>     COPD> still smoking 4cig/d she says & can't vs won't quit; not on meds, denies cough/ sputum/ hemoptysis/ SOB/ ch in DOE, etc; CXR 7/17= COPD, mild basilar atx, NAD; Spirometry 11/16 shows mild obstructive dis (FEV1=1.69, 88%)- must quit all smoking!     Palpit> this was a new complaint 1/15> w/u DrArida was neg (2DEcho, Cath) & symptoms improved w/ Metoprolol25Bid- asked to STOP SMOKING & avoid caffeine etc; Care Everywhere shows 30d Holter w/ PACs/ PVCs/ no AFib.    Cerebrovasc dis/ Aneurysm> on ASA325, Plavix75; she sees DrMorris IR at [facility] 8/12 w/ CTA that looked good- no resid aneurysm, distal flow appeared good & he didn't rec any changes; he requested that we do her f/u CTA & MRI Brain in [place] 9/14=> good flow thru the stented basilar art, no recurrent aneurysm, no recurrent acoustic neuroma;  Then she was [facility] at [facility] 2/15 w/ TIA & MRI/MRA Brain showed acute infarct in right occipital lobe, otherw stable;  Subsequent f/u by [facility] Neurology- no change in meds, rec quit smoking and control of BP, BS, Chol...    Hyperlipid> on Prav40; tol well & FLP 11/17 shows TChol  169, TG 158, HDL 46, LDL 91; continue same + better diet...    Hypothyroid> on Levothy50 now;  Labs 11/16 on Levothy75 showed TSH=0.22, Labs 11/17 showed TSH=0.20 so we decr her to 40mcg/d;  Labs 10/23/16 shows TSH=6.28 &  reminded to take it Qam by itself & don't eat for 81min.    DJD/ FM> she is managing quite well w/ prn Tramadol & Tylenol; prev CWP resolved, now c/o intermittent left arm discomfort...    Osteopenia> she had min osteopenia on BMD several yrs ago & takes Calcium, MVI, Vit D 1000u daily; f/u BMD 2/15 showed TScores -0.6 in Spine, and -1.6 in right FemNeck; rec to stay on supplements & wt bearing exercise...    RLS> on Mirapex 0.25mg  Qhs as needed which continues to help she says...     Acoustic Neuroma> resected by DrBrowne [facility] 2002; MRI 2/11 continued to look good- post op changes, no recurrent tumor seen; f/u MRI here 9/14 (& [facility] 2/15) continues to look good- no evid for recurrent or resid tumor.    Headaches> s/p eval by Neuro, DrJaffe- 9/16 note reviewed, MRI w/o acute changes, Cspine spondy noted w/ some spinal stenosis, treated w/ Pred taper & improved; she tells me that she has stopped seeing Neuro because she was INTOL to all his meds uses to rx her HAs, she notes only the Tramadol+Tylenol regimen seems to help...    Anxiety> on Tranxene as needed... EXAM shows Afeb, VSS, O2sat=98% on RA;  HEENT- neg, mallampati1;  Chest- clear w/o w/r/r;  Heart- RR w/o m/r/g;  Abd- soft, nontender, neg;  Ext- VI w/o c/c/e;  Neuro- no focal neuro deficits...  LABS 10/23/16>  TSH= 6.28 & she is reminded to take it ist thing in the AM on empty stomach w/o other meds, & don't eat/ drink for 45 min... IMP/PLAN>>  Ana Santos is stable given her severe underlying dis & continued smoking habit;  She is asked to quit all smoking, take meds regularly as discussed & we gave her the PREVNAR-13 vaccination today;  We plan ROV recheck in 79mo w/ CXR, EKG, FASTING blood work...          Problem List:    COPD (ICD-496) - she has a min smoker's cough, sm amt beige sputum... ~  CXR 5/08 w/ chr changes, LLL scar, NAD. ~  CXR 7/09 showed norm hrt size, clear lungs, NAD. ~  CXR 9/10 showed NAD. ~  CXR 10/11 showed chr changes, NAD. ~  CXR 12/12 showed COPD, NAD. ~  CXR 4/13 showed normal heart size, incr interstitial markings, NAD. ~  CXR 1/15 showed norm heart size, COPD/Emphysema w/ scarring in lingula, NAD. ~  CXR 2/16 showed norm heart size, linear atx vs scarring left base, otherw neg- NAD ~  Spirometry 04/22/15>  FVC=2.46 (97%), FEV1=1.69 (88%), %1sec=69%, mid-flows reduced at 64% predicted;  This is c/w mild airflow obstruction and GOLD Stage 1 COPD  CIGARETTE SMOKER (ICD-305.1) - can't vs won't quit and not interested in smoking cessation help... now she states she's decr to ~1/4-1/2 ppd but can't seem to improve from there... ~  She understands the critical need to quit smoking from the COPD/Pulm, Cardiac, & Vascular/Stroke standpoints;  She declines offers for smoking cessation help, chantix, nicotine replacement, etc... ~  2/15:  She was hosp at [facility] w/ Diplopia & found to have a right occipital stroke=> she has decr smoking to <1cig/d & encouraged to quit completely!!! ~  5/15 to 11/15:  She is still smoking 3-4cig/d she says & I have begged her to quit, discussed nicotine replacement rx & alternatives... ~  2/16: she is still smoking <1/2ppd she says but again declines smoking cessation help... ~  5/17: she has decr  to 1-2 cig/d she says, reminded of the need to quit completely!  ?MITRAL VALVE PROLAPSE (ICD-424.0) >> mild MVP seen on cath 1995 by DrStuckey... Hx of CHEST PAIN (ICD-786.50) >> she had CP in the past and a neg cath 1995 (min coronary irregularities)...  Hx PALPITATIONS >> PVCs and PACs seen on EKG & Holter monitor in 2015; asked to stop all caffeine & nicotine... ~  CP eval 1995 w/ cath showing min 10% luminal irreg RCA & mild MVP, norm LVF... ~  [address] was neg- no ischemia, no infarct, EF=66%... ~  baseline EKG showed NSR, NSSTTWA (poor R prog V1-V3)... ~  EKG 1/15 showed NSR, rate60, poor R prog V1-3, NSSTTWA, NAD... ~  2DEcho 2/15 showed normal LV size & function w/ EF=55-60%, no regional wall motion abnormalities, Gr1DD, norm valves and RV ~  Holter Monitor 2/15 showed NSR, PACs, PVCs, and no pathologic arrhythmias (the skips correspond to her subjective SOB/palpit)...  ~  She had another 30d Holter per [facility]rology w/ Care Everywhere report indicating PACs & PVCs, no AFib... ~  EKG 2/16 showed NSR, rate80, PVCs noted, poor R prog V1=>3, NSSTTWA... She noted incr palpit & CP => referred to Cards for their review... ~  Seen by DrArida 2/16> Cath showed minor luminal irregularities w/o obstructive CAD, norm LVF w/ EF=55%, no signif MR; symptoms felt to be due to PVCs & Rx w/ Metoprolol improved her symptoms...  ~  2DEcho 3/16 showed norm LV size & function w/ EF=65-70%, no regional wall motion abn, Gr1DD, norm valves, norm RV & PAsys...  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 referred to [facility] w/ Angiogram confirmation & subseq stent assisted coiling of the aneurysm 11/10... subseq f/u angiogram 2/11 showed complete angiographic obliteration of the aneurysm, & patent stent in distal basilar art (there was a new 50%stenosis in the right PCA)... on ASA 325mg /d & PLAVIX restarted 2/11 w/ new gait abn & cbll infarct on MRI. ~  she saw DrMorris [facility]- interventional neuroradiology- w/ another arteriogram performed 8/11- it showed satis appearance of the basilar aneurysm stent & coiling w/ some prolapse of the coils into the P1 segm w/ mild compromise of the post cerebral art (flow looks adeq & he rec continued ASA/ Plavix therapy & f/u 71yr). ~  She had f/u DrMorris 8/12 w/ CTA that looked good- no resid aneurysm, distal flow appeared good & he didn't rec any  changes- f/u planned 73yrs. ~  7/14: she is due for her f/u CTA & we will call [facility] regarding a follow up appt w/ DrMorris.. ~  9/14:  on [id number], Plavix75; last f/u DrMorris IR at [facility] 8/12 w/ CTA that looked good- no resid aneurysm, distal flow appeared good & he didn't rec any changes; he requested that we do her f/u CTA & MRI Brain here in [place] 9/14=> good flow thru the stented basilar art, no recurrent aneurysm, no recurrent acoustic neuroma...  ~  CTA Head 9/14 showed s/p stent assisted coiling of basilar tip aneurysm, no aneurysm recurrence, patent basilar art & left post cerebral art, mild atherosclerotic calcif in cavernous carotids w/o stenosis, post op right mastoid changes w/o recurrent tumor. ~  2/15: Adm to [facility] w/ Dip[lopia & eval revealed right occipital lobe infarct, everything else was stable, continued on her ASA325/ Plavix75=> we will refer to [facility] for their review of her situation... ~  3/15: she  had a busy March> f/u w/ Neuro in [place] then had another TIA & presented to the ER; CTA Head & Neck showed similar to prev, no recurrent aneurysm, & they disch her on same ASA325 + Plavix75... ~  9/15: she had f/u DrHusseini at [facility], note reviewed on Care Everywhere- felt to be stable & no changes made> rec to quit smoking & control BP, BS, Chol; they released her to f/u prn... ~  11/15: pt upset that Neuro didn't address her c/o temporal HAs, falling, weak spells- we will refer to [facility] per her request...  ~  2/16: pt seen by DrJaffe> Hx basilar tip aneurysm s/p coiling 2010, right cbll infarct, right vestib schwannoma s/p surg 2002, recurrent TIA, & HAs; they reied Gabapentin=> HA improved but she developed CP & thought it was from this med therefore stopped it; she was advised to try MelatoninQhs & limit her Tramadol rx, f/u 5mo...  VENOUS INSUFFICIENCY (ICD-459.81) - she tells me that she saw DrKrush for vein surgery and treatment ("it's covered by my husb's  insurance").  HYPERLIPIDEMIA (ICD-272.4) - now on PRAVASTATIN 40mg /d & tol well... we reviewed low chol/ low fat diet. ~  chart review shows TChol ~ 200 range in the 80's and early 90's. ~  then TChol incr to ~250 range in late 90's and Lipitor started-  ~  FLP 5/08 on Lipitor 10mg /d showed TChol 164, TG 153, HDL 45, LDL 89. ~  in 2009 c/o no energy & arm discomfort she felt was from the Lip10- therefore DC'd. ~  [place] 7/09 on diet alone showed TChol 234, TG 218, HDL 40, LDL 139... rec- start Prav40. ~  FLP 3/10 on Prav40 showed TChol 182, Tg 137, HDL 47, LDL 107 ~  FLP 6/11 on Prav40 showed TChol 162, TG 230, HDL 37, LDL 88 ~  FLP 6/12 on Prav40 showed TChol 177, TG 139, HDL 44, LDL 105... Contin diet, take med every day. ~  FLP 6/13 on Prav40 showed TChol 151, TG 138, HDL 42, LDL 81  ~  FLP 7/14 on Prav40 showed TChol 174, TG 68, HDL 58, LDL 103 ~  FLP 5/15 on Prav40 showed TChol 164, TG 120, HDL 49, LDL 91 ~  FLP 11/15 on Prav40 showed TChol 168, TG 129, HDL 43, LDL 99  ~  FLP 11/16 on Prav40 showed TChol 140, TG 112, HDL 43, LDL 75  HYPOTHYROIDISM (ICD-244.9) - stable on LEVOTHYROID 73mcg/d now... hx Hashimoto's thyroiditis in 1989 w/ markedly pos antimicrosomal antibodies and transient hyperthy labs...  ~  labs 5/08 showed TSH= 0.53... rec- continue Levo100/d. ~  labs 7/09 showed TSH= 0.07... rec- decr Levothy100 to 1/2 daily. ~  labs 3/10 showed TSH= 17.71... rec> incr back to 129mcg/d. ~  labs 9/10 showed TSH= 0.21... continue same. ~  labs 6/11 showed TSH= 0.10... rec decr Levo100 to 1/2 daily. ~  labs 10/11 on Levo50 showed TSH= 1.25 (FreeT3 & FreeT4 normal) ~  Labs 6/12 on Levo50 showed TSH= 12.56 ==> we will contact pt & pharm to determine if taking Levo100-1/2tab daily every day? & adjust. ~  Labs 12/12 on Levo75 showed TSH= 0.88... Continue same. ~  Labs 6/13 on Levo75 showed TSH= 0.71... Continue same. ~  Labs 7/14 on Levo75 showed TSH= 2.47 ~  Labs 5/15 on Levo75 showed TSH=  0.25 and we rec decr to Levothy50... ~  Labs 11/15 on Levo50 showed TSH= 9.91 and she is rec to go back on Synthroid75 ~  Labs 11/16 on Levo75 showed TSH=0.22 and rec to continue same for now... ~  Labs 5/17 on Levo75 showed TSH= 0.48  IRRITABLE BOWEL SYNDROME (ICD-564.1) - had colonoscopy by [facility] 11/06 that was WNL.Marland Kitchen. she notes some constipation & Rx w/ colase/ senakot-S OTC...  NEPHROLITHIASIS (ICD-592.0) - last stone passed in 1993, no known recurrence since then...  Hx of TENDINITIS, RIGHT ELBOW (ICD-727.09) Hx right shoulder pain w/ eval by DrNorris (MRI was planned butnever done & symptoms improved spont)... FIBROMYALGIA (ICD-729.1) - prev severe symptoms may have reflected a flair in her FM> but much improved on Pred Rx. ~  labs 9/10 showed Sed= 24 ~  labs 6/11 showed Sed= 30 ~  labs 10/11 showed CBC=norm, Chems=norm, TFTs=norm, Sed=21, RF=neg, ANA+1:40 speckled... ~  12/11:  pt saw DrTruslow & his note is pending- she indicates he stopped her Pred, & gave her shot in shoulder.  OSTEOPENIA (ICD-733.90) - BMD here 7/09 showed TScores -0.8 in Spine, and -1.2 in right FemNeck... rec to take Calcium, MVI, Vit D... ~  BMD repeated 2/15 & showed TScores -0.6 in Spine, and -1.6 in right FemNeck; rec to stay on Calcium, MVI, VitD, & wt bearing exercise...  VITAMIN D DEFICIENCY (ICD-268.9) ~  labs 7/09 showed Vit D level = 16... rec- start Vit D 50K weekly. ~  labs 3/10 showed Vit D level = 69... rec> change to 1000 u daily. ~  labs 6/11 showed Vit D level = 55... Continue same. ~  Labs 6/13 showed Vit D level = 53... Continue same. 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 no known recurrence... ~  MRI Brain 2/07 showed s/p translabyrinthine approach to right acoustic neuroma w/o recurrence, NAD... ~  MRI Brain 10/10 showed no recurrence of tumor, but 4-29mm basilar tip aneurysm detected & Rx as  above. ~  MRI Brain 2/11 showed no recurrence/ post op changes, endovasc oblit of basilar aneurysm, cerebellar lacunar infarct & 50% stenosis of right PCA... PLAVIX restarted... she had review by [facility] IR & Neurology (as above)... ~  We discussed f/u MRI since DrBrowne hasn't seen her since 2011 (?he turned her over to DrMorris)... ~  9/14:  MRI Brain showed no evid of resid or recurrent vestib schwannoma on the right, post surg changes w/ scarring in the int auditory canal, ols sm vessel cerebellar infarctions are stable, mild atrophy & sm vessel dis, prev coiled basilar tip aneurysm, no acute insults...  ~  MRI/ MRA Brain 2/15 at [facility] showed acute infarct in right occipital lobe, chronic infarcts in cerebellum bilat, aneurysm coiling of basilar art tip and stent in basilar art.. ~  CT Angio Head & Neck 3/15 in [place] showed 40% narrowing of prox left subclavian art; 40% RICAstenosis & 09% LICAstenosis; aneurysm coiling & stenting of basilar tip aneurysm...  RLS symptoms >> she complained 1/14 about new onset RLS symptoms- discussed trial Mirapex 0.25mg  prn...  ANXIETY (ICD-300.00) - uses CHLORAZEPATE 7.5mg Tid... she gets claustrophobic and needs sedation before MRIs.   Past Surgical History:  Procedure Laterality Date  . ANEURYSM COILING  11-10   Dr. Estanislado Pandy  . BREAST LUMPECTOMY    . LEFT HEART CATHETERIZATION WITH CORONARY ANGIOGRAM N/A 08/05/2014   Procedure: LEFT HEART CATHETERIZATION WITH CORONARY ANGIOGRAM;  Surgeon: Wellington Hampshire, MD;  Location: [place] CATH LAB;  Service: Cardiovascular;  Laterality: N/A;  . TOTAL ABDOMINAL HYSTERECTOMY    .  TRANSLABYRINTHINE PROCEDURE  2002   [facility] Dr. Vicie Mutters    Outpatient Encounter Prescriptions as of 10/23/2016  Medication Sig  . acetaminophen (TYLENOL) 500 MG tablet Take 500 mg by mouth every 6 (six) hours as needed for mild pain.   Marland Kitchen aspirin 325 MG tablet Take 325 mg by mouth daily.    . calcium gluconate 500 MG tablet Take 1 tablet by  mouth daily.  . cholecalciferol (VITAMIN D) 1000 UNITS tablet Take 1,000 Units by mouth daily.    . clopidogrel (PLAVIX) 75 MG tablet TAKE 1 TABLET DAILY  . clorazepate (TRANXENE) 7.5 MG tablet Take 1 tablet (7.5 mg total) by mouth 3 (three) times daily as needed for anxiety.  . hydroxypropyl methylcellulose / hypromellose (ISOPTO TEARS / GONIOVISC) 2.5 % ophthalmic solution Place 1 drop into both eyes as needed for dry eyes.  Marland Kitchen levothyroxine (SYNTHROID) 50 MCG tablet Take 1 tablet (50 mcg total) by mouth daily before breakfast.  . metoprolol tartrate (LOPRESSOR) 25 MG tablet TAKE 1 TABLET TWICE A DAY  . pramipexole (MIRAPEX) 0.25 MG tablet TAKE 1 TABLET AT BEDTIME FOR RESTLESS LEG SYMPTOMS  . pravastatin (PRAVACHOL) 40 MG tablet TAKE 1 TABLET DAILY  . traMADol (ULTRAM) 50 MG tablet Take 1 tablet (50 mg total) by mouth 3 (three) times daily as needed for moderate pain.  . [DISCONTINUED] senna-docusate (SENOKOT-S) 8.6-50 MG per tablet Take 1 tablet by mouth at bedtime.    No facility-administered encounter medications on file as of 10/23/2016.     Allergies  Allergen Reactions  . Atorvastatin Other (See Comments)     Lipitor caused arm pain (3/09)  . Hydrocodone-Acetaminophen Other (See Comments)    hallucinations  . Morphine Other (See Comments)    hallucinations  . Nortriptyline     Caused nausea and vomiting and shaking, kept her up all night  . Topamax [Topiramate] Nausea And Vomiting    *dizziness*    Immunization History  Administered Date(s) Administered  . Influenza Split 06/09/2011, 03/29/2012  . Influenza Whole 03/12/2009, 03/29/2010  . Influenza, High Dose Seasonal PF 03/17/2016  . Influenza,inj,Quad PF,36+ Mos 05/22/2013, 04/17/2014, 04/22/2015  . Pneumococcal Conjugate-13 10/23/2016  . Pneumococcal Polysaccharide-23 12/18/2007    Current Medications, Allergies, Past Medical History, Past Surgical History, Family History, and Social History were reviewed in [employer] record.   Review of Systems         See HPI - all other systems neg except as noted... The patient complains of dyspnea on exertion, recent palpitations & HAs.  The patient denies anorexia, fever, weight loss, weight gain, vision loss, decreased hearing, hoarseness, chest pain, syncope, peripheral edema, prolonged cough, hemoptysis, abdominal pain, melena, hematochezia, severe indigestion/heartburn, hematuria, incontinence, muscle weakness, suspicious skin lesions, transient blindness, difficulty walking, depression, unusual weight change, abnormal bleeding, enlarged lymph nodes, and angioedema.     Objective:   Physical Exam     WD, WN, 76 y/o WF in NAD... GENERAL:  Alert & oriented; pleasant & cooperative... HEENT:  [place]/AT, EOM-wnl, PERRLA, EACs-clear, TMs- some scarring on right, NOSE-clear, THROAT-clear & wnl. NECK:  Supple w/ fairROM; no JVD; normal carotid impulses w/o bruits; palp thyroid, no nodules felt; no lymphadenopathy. CHEST:  Clear to P & A; without wheezes/ rales/ or rhonchi heard... HEART:  Regular Rhythm; gr1/6 SEM,  no irregularity, rubs or gallops heard... ABDOMEN:  Soft & nontender; normal bowel sounds; no organomegaly or masses detected. EXT: without deformities, mild arthritic changes; no varicose veins/ venous insuffic/ or  edema... +trigger points about the neck/ shoulders w/ decr ROM right shoulder w/ pain... NEURO:  CN's intact; no focal neuro deficits... gait & station are normal. DERM:  No lesions noted; no rash etc...  RADIOLOGY DATA:  Reviewed in the EPIC EMR & discussed w/ the patient...  LABORATORY DATA:  Reviewed in the EPIC EMR & discussed w/ the patient...   Assessment & Plan:    10/23/16>   Ana Santos is stable given her severe underlying dis & continued smoking habit;  She is asked to quit all smoking, take meds regularly as discussed & we gave her the PREVNAR-13 vaccination today;  We plan ROV recheck in 25mo w/ CXR, EKG, FASTING  blood work   COPD/ Smoker>  She notes min cough/ sputum/ etc; desperately needs to quit smokng- offered counseling, medications, etc but she declines...  Hx mild MVP>  No MVP seen on 2DEcho 2/15, mild MVP was noted on cath 1995 by DrStuckey...  PALPITATIONS>> Prev eval w/ CXR, EKG, 2DEcho & Holter monitor=> all studies OK/NEG as above (x PACs/ PVCs); we stressed the importance of no nicotine, no caffeine, etc... 2/16> she presented w/ incr symptoms over the last month; rec to start Metop25- 1/2Bid & refer to Cards for their review... 2/16> seen by DrArida w/ Cath, repeat 2DEcho- both neg & palpit/ PVCs improved w/ Metoprolol rx...  Hx right sided Acoustic Neuroma- s/p surg 2002 at [facility] DrBrowne> no known recurrence to date including MRI 9/14...  CEREBROVASC Dis w/ basilar tip aneurysm stent & coiling 11/10 by DrDeveshwar; f/u arteriogram 2/11 at [facility] & 8/11 at [facility] showed some compromise of the post circ w/ prolapse of the coils into the P1 segm;  Note: MRI 2/11 done for new gait abn showed cbll infarct & ASA/ Plavix restarted and she did home phys therapy w/ improvement;  Repeat IR eval 8/12 w/ CTA showed oblit of the aneurysm & distal flow appears good as well- they rec repeat studies in 2 yrs=> Done 9/14 in [place] & no aneurysm seen, stent ok w/ good flow thru the area, felt to be stable... Right Occipital Lobe Infarct 2/15>  As above, she remains on ASA325 & Plavix75, we will refer to [facility] Neurology division for their recommendations... Another TIA 3/15>  eval via ER w/ CTA Head & Neck- stable, no changes made to meds- rec to continue ASA/ Plavix, AND QUIT SMOKING.... She was released by [facility] Neuro- DrHusseini & asked to quit smoking, continue ASA/Plavix, keep BP/ BS/ Chol under control... 11/15> pt c/o HAs, falls, weakness & requests Neuro 2nd opinion at [facility]... 2/16> seen by [facility] for HAs & neuro 2nd opinion...  HYPERLIPID>  On Prav40 + diet;  FLP looks OK but needs better diet & take med  Qhs...  HYPOTHYROID>  Back on Levothy 30mcg/d now w/ TSH= 9.91 last OV on 48mcg/d...  IBS w/ Constip>  She uses OTC laxatives as needed & we discussed Miralax/ Senakot-S/ etc...  ORTHO>  Prev right shoulder discomfort improved spont & MRI was never done; currently uses Tramadol Prn... ~  2017> c/o neck & back pain; DrJaffe sent her to [facility] & he has sched back injections...  OSTEOPENIA/ Vit D defic>  Stable on Calcium, MVI, Vit D supplement; due for f/u BMD=> pending...  RLS symptoms>  trial Mirapex 0.25mg  hs prn...  Hx Chronic daily HAs>>  HAs improved spontaneously- on Tramadol & Tylenol w/ some relief of pain; she tried Topamax but intol w/ dizzy & nausea... 11/15> presented w/ incr  HAs and referred to Neuro; they tried Neurontin w/ decr HAs but intol she says...  ANXIETY>  Uses Tranxene Prn (she has some claustrophobia & needs sedation before MRIs- she doesn't remember the 2/11 MRI being done!).Marland KitchenMarland Kitchen   Patient's Medications  New Prescriptions   No medications on file  Previous Medications   ACETAMINOPHEN (TYLENOL) 500 MG TABLET    Take 500 mg by mouth every 6 (six) hours as needed for mild pain.    ASPIRIN 325 MG TABLET    Take 325 mg by mouth daily.     CALCIUM GLUCONATE 500 MG TABLET    Take 1 tablet by mouth daily.   CHOLECALCIFEROL (VITAMIN D) 1000 UNITS TABLET    Take 1,000 Units by mouth daily.     CLOPIDOGREL (PLAVIX) 75 MG TABLET    TAKE 1 TABLET DAILY   CLORAZEPATE (TRANXENE) 7.5 MG TABLET    Take 1 tablet (7.5 mg total) by mouth 3 (three) times daily as needed for anxiety.   HYDROXYPROPYL METHYLCELLULOSE / HYPROMELLOSE (ISOPTO TEARS / GONIOVISC) 2.5 % OPHTHALMIC SOLUTION    Place 1 drop into both eyes as needed for dry eyes.   LEVOTHYROXINE (SYNTHROID) 50 MCG TABLET    Take 1 tablet (50 mcg total) by mouth daily before breakfast.   METOPROLOL TARTRATE (LOPRESSOR) 25 MG TABLET    TAKE 1 TABLET TWICE A DAY   PRAMIPEXOLE (MIRAPEX) 0.25 MG TABLET    TAKE 1 TABLET AT BEDTIME  FOR RESTLESS LEG SYMPTOMS   PRAVASTATIN (PRAVACHOL) 40 MG TABLET    TAKE 1 TABLET DAILY   TRAMADOL (ULTRAM) 50 MG TABLET    Take 1 tablet (50 mg total) by mouth 3 (three) times daily as needed for moderate pain.  Modified Medications   No medications on file  Discontinued Medications   SENNA-DOCUSATE (SENOKOT-S) 8.6-50 MG PER TABLET    Take 1 tablet by mouth at bedtime.

## 2017-01-10 ENCOUNTER — Other Ambulatory Visit: Payer: Self-pay | Admitting: Pulmonary Disease

## 2017-03-28 ENCOUNTER — Other Ambulatory Visit: Payer: Self-pay | Admitting: Pulmonary Disease

## 2017-04-10 ENCOUNTER — Other Ambulatory Visit: Payer: Self-pay | Admitting: Pulmonary Disease

## 2017-04-23 ENCOUNTER — Other Ambulatory Visit: Payer: Self-pay | Admitting: Pulmonary Disease

## 2017-04-25 ENCOUNTER — Ambulatory Visit (INDEPENDENT_AMBULATORY_CARE_PROVIDER_SITE_OTHER): Payer: Medicare Other | Admitting: Pulmonary Disease

## 2017-04-25 ENCOUNTER — Other Ambulatory Visit (INDEPENDENT_AMBULATORY_CARE_PROVIDER_SITE_OTHER): Payer: Medicare Other

## 2017-04-25 ENCOUNTER — Ambulatory Visit (INDEPENDENT_AMBULATORY_CARE_PROVIDER_SITE_OTHER)
Admission: RE | Admit: 2017-04-25 | Discharge: 2017-04-25 | Disposition: A | Discharge status: Home / Self Care | Payer: Medicare Other | Source: Ambulatory Visit | Attending: Pulmonary Disease | Admitting: Pulmonary Disease

## 2017-04-25 ENCOUNTER — Encounter: Payer: Self-pay | Admitting: Pulmonary Disease

## 2017-04-25 VITALS — BP 138/78 | HR 74 | Ht 62.5 in | Wt 120.2 lb

## 2017-04-25 DIAGNOSIS — E559 Vitamin D deficiency, unspecified: Secondary | ICD-10-CM

## 2017-04-25 DIAGNOSIS — G459 Transient cerebral ischemic attack, unspecified: Secondary | ICD-10-CM | POA: Diagnosis not present

## 2017-04-25 DIAGNOSIS — I6350 Cerebral infarction due to unspecified occlusion or stenosis of unspecified cerebral artery: Secondary | ICD-10-CM | POA: Diagnosis not present

## 2017-04-25 DIAGNOSIS — F411 Generalized anxiety disorder: Secondary | ICD-10-CM

## 2017-04-25 DIAGNOSIS — I663 Occlusion and stenosis of cerebellar arteries: Secondary | ICD-10-CM | POA: Diagnosis not present

## 2017-04-25 DIAGNOSIS — E782 Mixed hyperlipidemia: Secondary | ICD-10-CM | POA: Diagnosis not present

## 2017-04-25 DIAGNOSIS — Z23 Encounter for immunization: Secondary | ICD-10-CM

## 2017-04-25 DIAGNOSIS — F17200 Nicotine dependence, unspecified, uncomplicated: Secondary | ICD-10-CM | POA: Diagnosis not present

## 2017-04-25 DIAGNOSIS — D333 Benign neoplasm of cranial nerves: Secondary | ICD-10-CM

## 2017-04-25 DIAGNOSIS — I679 Cerebrovascular disease, unspecified: Secondary | ICD-10-CM | POA: Diagnosis not present

## 2017-04-25 DIAGNOSIS — J4489 Other specified chronic obstructive pulmonary disease: Secondary | ICD-10-CM

## 2017-04-25 DIAGNOSIS — G2581 Restless legs syndrome: Secondary | ICD-10-CM | POA: Diagnosis not present

## 2017-04-25 DIAGNOSIS — J449 Chronic obstructive pulmonary disease, unspecified: Secondary | ICD-10-CM

## 2017-04-25 LAB — LIPID PANEL
CHOL/HDL RATIO: 3
CHOLESTEROL: 139 mg/dL (ref 0–200)
HDL: 48.7 mg/dL (ref >39.00)
LDL Cholesterol: 69 mg/dL (ref 0–99)
NonHDL: 90.17
TRIGLYCERIDES: 107 mg/dL (ref 0.0–149.0)
VLDL: 21.4 mg/dL (ref 0.0–40.0)

## 2017-04-25 LAB — COMPREHENSIVE METABOLIC PANEL
ALBUMIN: 4 g/dL (ref 3.5–5.2)
ALK PHOS: 99 U/L (ref 39–117)
ALT: 7 U/L (ref 0–35)
AST: 15 U/L (ref 0–37)
BILIRUBIN TOTAL: 0.4 mg/dL (ref 0.2–1.2)
BUN: 13 mg/dL (ref 6–23)
CALCIUM: 9.7 mg/dL (ref 8.4–10.5)
CO2: 28 meq/L (ref 19–32)
CREATININE: 0.82 mg/dL (ref 0.40–1.20)
Chloride: 106 mEq/L (ref 96–112)
GFR: 71.98 mL/min (ref >60.00)
Glucose, Bld: 87 mg/dL (ref 70–99)
Potassium: 4 mEq/L (ref 3.5–5.1)
Sodium: 142 mEq/L (ref 135–145)
Total Protein: 7.5 g/dL (ref 6.0–8.3)

## 2017-04-25 LAB — CBC WITH DIFFERENTIAL/PLATELET
BASOS ABS: 0.1 10*3/uL (ref 0.0–0.1)
BASOS PCT: 0.7 % (ref 0.0–3.0)
Eosinophils Absolute: 0.2 10*3/uL (ref 0.0–0.7)
Eosinophils Relative: 1.9 % (ref 0.0–5.0)
HEMATOCRIT: 38.8 % (ref 36.0–46.0)
Hemoglobin: 12.4 g/dL (ref 12.0–15.0)
LYMPHS PCT: 27.2 % (ref 12.0–46.0)
Lymphs Abs: 3.1 10*3/uL (ref 0.7–4.0)
MCHC: 32 g/dL (ref 30.0–36.0)
MCV: 90.1 fl (ref 78.0–100.0)
MONOS PCT: 6.1 % (ref 3.0–12.0)
Monocytes Absolute: 0.7 10*3/uL (ref 0.1–1.0)
NEUTROS ABS: 7.4 10*3/uL (ref 1.4–7.7)
Neutrophils Relative %: 64.1 % (ref 43.0–77.0)
PLATELETS: 364 10*3/uL (ref 150.0–400.0)
RBC: 4.31 Mil/uL (ref 3.87–5.11)
RDW: 13.3 % (ref 11.5–15.5)
WBC: 11.6 10*3/uL — AB (ref 4.0–10.5)

## 2017-04-25 LAB — TSH: TSH: 10.82 u[IU]/mL — ABNORMAL HIGH (ref 0.35–4.50)

## 2017-04-25 NOTE — Patient Instructions (Signed)
Today we updated your med list in our EPIC system...    Continue your current medications the same...  Today we checked your follow up FASTING blood work & CXR...    We will contact you w/ the results when available...   We also gave you the 2018 flu vaccine...  By the way-  I just thought I'd let you know-  It is absolutely OK with me if you quit the last of those nasty cigarettes!!!  Call for any questions or if we can be of service in any way...  Let's plan a follow up visit in 29mo, sooner if needed for problems.Marland KitchenMarland Kitchen

## 2017-04-26 MED ORDER — LEVOTHYROXINE SODIUM 75 MCG PO TABS: 75.0000 ug | ORAL_TABLET | Freq: Every day | ORAL | 2 refills | Status: DC

## 2017-04-30 NOTE — Progress Notes (Signed)
Subjective:    Patient ID: Ana Santos, female    DOB: [birth year], 76 y.o.   MRN: [id number]  HPI 76 y/o WF here for a follow up visit... she has multiple medical problems as reviewed below...   ~  SEE PREV EPIC NOTES FOR EARLIER DATA >>   LABS 6/13:  FLP- at goals on Prav40;  Chems- wnl;  CBC- wnl;  TSH=0.71;  VitD=53...  CXR 7/14> she forgot to go to the [place] for this film...   LABS 7/14:  FLP- looks ok on Prav40;  Chems- wnl;  CBC- wnl;  TSH=2.47;  VitD=67... ADDENDUM> DrMorris ([facility], Interventional radiology) insists that we obtain her f/u CTAngiogram & send it to him, we will also obtain f/u MRI Brain to assess her acoustic neuroma...  CTA Head 9/14 showed s/p stent assisted coiling of basilar tip aneurysm, no aneurysm recurrence, patent basilar art & left post cerebral art, mild atherosclerotic calcif in cavernous carotids w/o stenosis, post op right mastoid changes w/o recurrent tumor  MRI Brain 9/14 showed no evid of resid or recurrent vestib schwannoma on the right, post surg changes w/ scarring in the int auditory canal, ols sm vessel cerebellar infarctions are stable, mild atrophy & sm vessel dis, prev coiled basilar tip aneurysm, no acute insults... ADDENDUM> Letter from DrMorris 9/14 after review of the CTA indicates that everything is stable and flow is satisfactory (he is leaving [place] can assist in the future if needed).  CXR 1/15 showed norm heart size, COPD/Emphysema w/ scarring in lingula, NAD.Marland KitchenMarland Kitchen  EKG 1/15 showed NSR, rate60, poor R prog V1-3, NSSTTWA, NAD...  2DEcho 2/15 showed normal LV size & function w/ EF=55-60%, no regional wall motion abnormalities, Gr1DD, norm valves and RV...  Holter Monitor 2/15 showed NSR, PACs, PVCs, and no pathologic arrhythmias (the skips correspond to her subjective SOB/palpit)...  ~   August 12, 2013:  add-on visit post hospital check>  Ana Santos developed diplopia & blurry vision and was adm to [facility] 2/15 -  07/30/13> review of records indicated that her neuro exam was otherw WNL, MRI Brain showed a right occipital lobe infarct, and MRA showed no new abnormality- no recurrent aneurysm, patent sent & good flow in the PCA; symptom resolved spont & they continued her same meds ([id number] & Plavix75); she has been able to cut down to 1cig/d and again we reviewed the critical need to quit smoking completely... CT Brain 07/27/13 [facility] showed prior basilar tip aneurysm repair, post surg changes in the right mastoid region, NAD...  MRI Brain 2/15 showed sm areas of acute infarct in right occipital lobe (new from 9/14 MRI); chr infarcts in the cerebellum bilat, basilar art stent & coiling of basilar tip aneurysm...  MRA Brain 2/15 showed no evid for recurrent basilar tip aneurysm, mild to mod narrowing of the right P1 segment, left P1 segment stented...  CDopplers 2/15 showed some plaque in the bulbs and ICAs bilat, <50% stenoses bilat noted...   EKG 2/15 showed NSR, rate63, poor R prog V1-3, NAD...  LABS 2/15 at [facility] showed> Chems- wnl w/ Cr=1.0;  CBC- wnl w/ Hg=13.5;  Abn UA w/ UTI evident...  ~  Ana Santos went to the ER 08/26/13 with another TIA characterized by diplopia & ataxia that last 69min or so; assoc mild HA, no focal neuro findings in ER; already on ASA325/ Plavix75 w/ good compliance; She denies any additional cerebral ischemic symptoms since the 3/15 ER visit; unfortunately still smoking; review of  chart shows NO documented AFib in her past...  EKG 3/15 showed NSR, rate67, poor R prog V1-3, otherw wnl...  CT Head 3/15 showed evid prior vertebral art aneurysm surg & post-op changes in right mastoid, NAD...  CTA Head & Neck 3/15 showed prev basilar tip aneurysm coiling & basilar stent, prior right mastoidectomy, vessels are patent & no recurrent aneurysm; neck showed apical scarring & emphysema, 40% left subclav stenosis, 40-50% bilat ICA stenoses, mild stenosis at the origin of the  vertebral as well...  LABS 5/15:  FLP- at goals on Prav40;  Chems & LFTs- wnl;  TSH= 0.25 on Levothy75 & rec to decr to 40mcg/d...   ~  April 17, 2014:  85mo ROV & Ana Santos is c/o HA- sharp pains in her temples, assoc w/ occas weak spells ("don't feel like getting OOB"), poor appetite, and occas falling (no known injury); she remains on ASA325 & Plavix75> she voiced discontent w/ Neuro eval at [facility], states she never received call from her doctor (Neuro- DrHusseini);  I reviewed Care Everywhere records- summary & last note 03/11/14 from DrHusseini> extensive note reviewed, pt stable on ASA/ Plavix and no changes made, she was asked to f/u prn;  Ana Santos's BP, Chol, BS- all well controlled but she is still smoking and can't vs won't quit;  She requests 2nd opinion from local [facility] due to her temporal HAs, falling, weak spells...  We reviewed the following medical problems during today's office visit >>     COPD> still smoking 1/2ppd & can't vs won't quit; not on meds, min cough/ sputum, denies hemoptysis/ SOB/ ch in DOE, etc; CXR 1/15= COPD, NAD; she MUST quit all smoking!    Palpit> new complaint 1/15> w/u in progress & asked to STOP SMOKING & avoid caffeine etc; Care Everywhere shows 30d Holter w/ PACs/ PVCs/ no AFib...    Cerebrovasc dis/ Aneurysm> on ASA325, Plavix75; she sees DrMorris IR at [facility] 8/12 w/ CTA that looked good- no resid aneurysm, distal flow appeared good & he didn't rec any changes; he requested that we do her f/u CTA & MRI Brain in [place] 9/14=> good flow thru the stented basilar art, no recurrent aneurysm, no recurrent acoustic neuroma;  Then she was [facility] at [facility] 2/15 w/ TIA & MRI/MRA Brain showed acute infarct in right occipital lobe, otherw stable;  Subsequent f/u by [facility] Neurology- no change in meds, rec quit smoking and control of BP, BS, Chol...    Hyperlipid> on Prav40; tol well & FLP 11/15 shows TChol 168, TG 129, HDL 43, LDL 99; continue same + diet...    Hypothyroid>  on Levothy50 now; labs 11/15 shows TSH= 9.91 and she is rec to increase Synthroid back to 73mcg/d...    DJD/ FM> she is managing quite well w/ prn Tramadol & Tylenol; prev CWP resolved...    Osteopenia> she had min osteopenia on BMD several yrs ago & takes Calcium, MVI, Vit D 1000u daily; f/u BMD 2/15 showed TScores -0.6 in Spine, and -1.6 in right FemNeck; rec to stay on supplements & wt bearing exercise...    RLS> on Mirapex 0.25mg  Qhs as needed...     Acoustic Neuroma> this was resected by DrBrowne [facility] 2002; MRI 2/11 continued to look good- post op changes, no recurrent tumor seen; f/u MRI here 9/14 (& [facility] 2/15) continues to look good- no evid for recurrent or resid tumor...    Anxiety> on Tranxene as needed... We reviewed prob list, meds, xrays and labs> see  below for updates >> OK Flu shot today...  LABS 11/15:  FLP- at goals on Prav40;  Chems- wnl;  CBC- wnl;  TSH=9.91;  Sed=36... PLAN>>  She desperately need to quit smoking, declines smoking cessation help;  TSH is elev on Synthroid50 & she confirms daily dosing, therefore incr back to Synthroid75;  We will refer to [facility] for 2nd opinion regarding her cerebrovasc dis, HAs, falling, weak spells...   ~  August 03, 2014:  33mo ROV & acute add-on visit requested by Neuro-DrJaffe due to CP & irreg heartbeat> Zakara was at [facility] office today (for f/u of headaches- improved w/ Neurontin but intol she said due to incr palpit) & mentioned left CP & palpit, he noted skips on exam & called for a work-in appt here;  She has a cardiac hx- followed for yrs by DrStuckey w/ cath 1995 showing min nonobstructive CAD & mild MVP; she developed palpit in 2015 w/ abn EKG (poor R prog, NSTTWA)) and 2DEcho showing norm LVF, no regional wall motion abn, Gr1DD, normal valves & RV; Holter monitor revealed NSR w/ some PACs & PVCs but no dangerous arrhythmias- she was asked to avoid caffeine, pseudophed, and QUIT SMOKING (she still smokes ~1/2ppd)... As  noted she has severe cerebrovasc dis & hx Acoustic Neuroma w/ prev gamma knife surg at [facility] in 2002; a 30d Event monitor at [facility] was said to reveal PACs, PVCs, but no AFib... Current symptoms sound similar to prev w/ skipping sensation, no racing, sl SOB, mild chest discomfort on & off daily x 3-4wks (sharp, not sore to touch, sl worse w/ movement); she does not exercise & her most strenuous activity is housework...    We reviewed prob list, meds, xrays and labs> see below for updates >>   CXR 2/16 showed norm heart size, linear atx vs scarring left base, otherw neg- NAD.Marland KitchenMarland Kitchen  EKG today showed NSR, rate80, occ PVCs noted, poor R prog V1=>3, NSSTTWA... PLAN>>  We decided to start METOPROLOL 25mg - 1/2 tab Bid for now & refer to Cards for f/u eval; reminded about smoking cessation but she declines additional counseling; her husb sees DrMcLean...  ~  Oct 20, 2014:  37mo ROV & Treana reports improved after cardiac eval by DrArida (see below)- neg cath, neg 2DEcho, palpit improved w/ Metoprolol & decr caffeine etc; unfortunately she continues to smoke & will not quit, refuses smoking cessation meds etc... She is in the middle of bilat cat surg by DrBevis... No new complaints or concerns- denies cough, sput, hemoptysis, SOB, CP, f/c/s, etc; she states her main exercise is yard work... We reviewed prob list, meds, xrays and labs> see below for updates >>  PLAN>> she reports improved on current med regimen; we again discussed the need for smoking cessation, take meds regularly; she will f/u in 76mo w/ Fasting labs & call in the interim prn...  ~  April 22, 2015:  66mo ROV & Ana Santos has been having some increased HAs- went to see DrJaffe in Neuro w/ f/u MRI/MRA (no acute changes) and treated transiently w/ Pred taper & improved; she has Tramadol/ Tylenol to use as needed;  We reviewed the following medical problems during today's office visit >>     COPD> still smoking 5-6cig/d she says & can't vs won't quit; not on  meds, denies cough/ sputum/ hemoptysis/ SOB/ ch in DOE, etc; CXR 2/16= COPD, mild basilar atx, NAD; Spirometry shows mild obstructive dis- must quit all smoking!     Palpit> this  was a new complaint 1/15> w/u DrArida was neg (2DEcho, Cath) & symptoms improved w/ Metoprolol- asked to STOP SMOKING & avoid caffeine etc; Care Everywhere shows 30d Holter w/ PACs/ PVCs/ no AFib...    Cerebrovasc dis/ Aneurysm> on ASA325, Plavix75; she sees DrMorris IR at [facility] 8/12 w/ CTA that looked good- no resid aneurysm, distal flow appeared good & he didn't rec any changes; he requested that we do her f/u CTA & MRI Brain in [place] 9/14=> good flow thru the stented basilar art, no recurrent aneurysm, no recurrent acoustic neuroma;  Then she was [facility] at [facility] 2/15 w/ TIA & MRI/MRA Brain showed acute infarct in right occipital lobe, otherw stable;  Subsequent f/u by [facility] Neurology- no change in meds, rec quit smoking and control of BP, BS, Chol...    Hyperlipid> on Prav40; tol well & FLP 11/16 shows TChol 140, TG 112, HDL 43, LDL 75; continue same + diet...    Hypothyroid> on Levothy75 now; labs 11/15 shows TSH= 9.91 and she was rec to increase Synthroid back to 46mcg/d; Labs 11/16 showed TSH=0.22, continue same for now...    DJD/ FM> she is managing quite well w/ prn Tramadol & Tylenol; prev CWP resolved...    Osteopenia> she had min osteopenia on BMD several yrs ago & takes Calcium, MVI, Vit D 1000u daily; f/u BMD 2/15 showed TScores -0.6 in Spine, and -1.6 in right FemNeck; rec to stay on supplements & wt bearing exercise...    RLS> on Mirapex 0.25mg  Qhs as needed...     Acoustic Neuroma> resected by DrBrowne [facility] 2002; MRI 2/11 continued to look good- post op changes, no recurrent tumor seen; f/u MRI here 9/14 (& [facility] 2/15) continues to look good- no evid for recurrent or resid tumor.    Headaches> s/p eval by Neuro, DrJaffe- 9/16 note reviewed, MRI w/o acute changes, Cspine spondy noted w/ some spinal stenosis,  treated w/ Pred taper & improved...    Anxiety> on Tranxene as needed... We reviewed prob list, meds, xrays and labs> OK 2016 FLU vaccine today...  MRI/MRA 02/22/15>  No acute infarct, tiny cbll blood breakdown products unchanged w/o new areas of intracranial hemorrhage detected, remote right cbll infarcts & sm vessel dis, postop right vestib schwannoma surg- no acute changes, cerv spondylosis w/ sp stenosis C3-4 & C4-5, s/p coiling basilar tip aneurysm, mild irregularity of basilar art, mod narrowing of P1 segm left p[ost cerebral art (mild narrowing on right)...   Spirometry 04/22/15>  FVC=2.46 (97%), FEV1=1.69 (88%), %1sec=69%, mid-flows reduced at 64% predicted;  This is c/w mild airflow obstruction and GOLD Stage 1 COPD  LABS 04/22/15>  FLP- at goals on Prav40;  Chems- wnl;  CBC- wnl;  TSH= 0.22 on Synth75;  VitD= 48 on 1000u/d... IMP/PLAN>>  Kya is stable overall w/ her mult serious medical issues;  We again discussed smoking cessation;  She continues to do alterations as she has done for 50+yrs;  We refilled her Tramadol & Tranxene...  ~  Oct 20, 2015:  66mo ROV & Ana Santos has been stable over the interval, c/o incr pain/ discomfort in neck & back (sched for ?neck injections next week, DrJaffe, DrBotero);  She is still smoking but now down to 2cig/d she says, breathing is OK & she denies cough/ sput/ hemoptysis/ SOB/ or CP;  She has been walking some & doing yard work- says she is NOT limited by her breathing & she has declined breathing meds in the past... We reviewed the  following medical problems during today's office visit >>     COPD> still smoking 1-2cig/d she says & can't vs won't quit; not on meds, denies cough/ sputum/ hemoptysis/ SOB/ ch in DOE, etc; CXR 2/16= COPD, mild basilar atx, NAD; Spirometry shows mild obstructive dis- must quit all smoking!     Palpit> this was a new complaint 1/15> w/u DrArida was neg (2DEcho, Cath) & symptoms improved w/ Metoprolol- asked to STOP SMOKING &  avoid caffeine etc; Care Everywhere shows 30d Holter w/ PACs/ PVCs/ no AFib...    Cerebrovasc dis/ Aneurysm> on ASA325, Plavix75; she sees DrMorris IR at [facility] 8/12 w/ CTA that looked good- no resid aneurysm, distal flow appeared good & he didn't rec any changes; he requested that we do her f/u CTA & MRI Brain in [place] 9/14=> good flow thru the stented basilar art, no recurrent aneurysm, no recurrent acoustic neuroma;  Then she was [facility] 2/15 w/ TIA & MRI/MRA Brain showed acute infarct in right occipital lobe, otherw stable;  Subsequent f/u by [facility] Neurology- no change in meds, rec quit smoking and control of BP, BS, Chol...    Hyperlipid> on Prav40; tol well & FLP 11/16 shows TChol 140, TG 112, HDL 43, LDL 75; continue same + diet...    Hypothyroid> on Levothy75 now; labs 11/15 shows TSH= 9.91 and she was rec to increase Synthroid back to 34mcg/d; Labs 11/16 showed TSH=0.22, continue same for now...    DJD/ FM> she is managing quite well w/ prn Tramadol & Tylenol; prev CWP resolved...    Osteopenia> she had min osteopenia on BMD several yrs ago & takes Calcium, MVI, Vit D 1000u daily; f/u BMD 2/15 showed TScores -0.6 in Spine, and -1.6 in right FemNeck; rec to stay on supplements & wt bearing exercise...    RLS> on Mirapex 0.25mg  Qhs as needed...     Acoustic Neuroma> resected by DrBrowne [facility] 2002; MRI 2/11 continued to look good- post op changes, no recurrent tumor seen; f/u MRI here 9/14 (& [facility] 2/15) continues to look good- no evid for recurrent or resid tumor.    Headaches> s/p eval by Neuro, DrJaffe- 9/16 note reviewed, MRI w/o acute changes, Cspine spondy noted w/ some spinal stenosis, treated w/ Pred taper & improved...    Anxiety> on Tranxene as needed... EXAM shows Afeb, VSS, O2sat=100% on RA;  HEENT- neg, mallampati1;  Chest- clear w/o w/r/r;  Heart- RR w/o m/r/g;  Abd- soft, nontender, neg;  Ext- VI w/o c/c/e;  Neuro- no focal neuro deficits...  LAB 10/20/15:  TSH=0.48 on  Synthroid 105mcg/d- continue same... IMP/PLAN>>  Mult medical issues as noted; Ana Santos is still smoking although she has cut down to 1-2cig/d she is reminded of the need to quit completely; REC to continue same meds regularly; she is sched for shots in her back next week... We plan ROV w/ CXR & fasting labs in 66mo.  ~  April 24, 2016:  27mo ROV & pulm/ medical follow up visit>  Ana Santos is still smoking 2-4 cig/d, denies cough, sput, SOB, but not doing any exercise (some yard work/ walking & no difficulties reported;  He only issue is occas bronchitic infection- usually Rx w/ Levaquin & resolves;  She denies CP, get rare palpit, no edema, etc;  She has known cerebrovasc dis/ aneurysm & followed by [facility] for this, her hx acoustic neuroma, and chr HAs- Tramadol helps but nothing else (he tried 5 diff meds w/o benefit).Marland KitchenMarland Kitchen  As noted- she is still smoking but denies breathing problems, no on meds but strongly urged to quit all smoking & offered smoking cessation counseling but declines...   She remains on Prav40 + diet for Chol and FLP today shows TChol 169, TG 158, HDL 46, LDL 91...    Hx hypothyroidism on Synthroid60mcg/d but TSH=0.20 & we decr dose today to 69mcg/d...    She saw Neuro, DrJaffe last 03/31/16> extensive note reviewed- hx basilar tip aneurysm- s/p coiling 2010, right vestib schwannoma- s/presection 2002, right cerebelar infact, recurrent TIAs, cervical spinal stenosis, tension-type HAs; she has seen DrBotero & had nerve root injections for LBP... EXAM shows Afeb, VSS, O2sat=99% on RA;  HEENT- neg, mallampati1;  Chest- clear w/o w/r/r;  Heart- RR w/o m/r/g;  Abd- soft, nontender, neg;  Ext- VI w/o c/c/e;  Neuro- no focal neuro deficits...  Last CXR was 12/17/15>  Norm heart size, Ao atherosclerosis, hyperinflated w/ flat diaph c/w COPD, scarring left base, NAD...   LABS 04/24/16>  FLP- Chol ok x TG=158;  Chems- ok w/ BS=81, Cr=0.83;  CBC- wnl;  TSH=0.20 on Synthroid75;  VitD=56...    IMP/PLAN>>  Yatzari has mult medica issues and continues to smoke- refusing all smoking cessation help;  Labs ok today x low TSH & we will dec her Synthroid from84mcg/d to 46mcg/d w/ f/u lab on return;  She is due PREVNAR-13 vaccine;  Her CC is still her chr daily HAs and we discussed trial Tranxene 7.5mg Bid to see if this helps (w/ freq & severity of the HAs) and Tramadol 50mg  prn- monitor use...  ~  Oct 23, 2016:  30mo ROV & Ana Santos reports that she is still smoking 4 cig/d despite all her cerebrovasc dis- again advised to quit all smoking in light of her prev cerebral infarction and severe vasc disease;  She has similarly declined inhaled meds & is proud that she made it thru the winter & spring so far w/o a bronchitic exac or upper resp infection...  We reviewed the following medical problems during today's office visit >>     COPD> still smoking 4cig/d she says & can't vs won't quit; not on meds, denies cough/ sputum/ hemoptysis/ SOB/ ch in DOE, etc; CXR 7/17= COPD, mild basilar atx, NAD; Spirometry 11/16 shows mild obstructive dis (FEV1=1.69, 88%)- must quit all smoking!     Palpit> this was a new complaint 1/15> w/u DrArida was neg (2DEcho, Cath) & symptoms improved w/ Metoprolol25Bid- asked to STOP SMOKING & avoid caffeine etc; Care Everywhere shows 30d Holter w/ PACs/ PVCs/ no AFib.    Cerebrovasc dis/ Aneurysm> on ASA325, Plavix75; she sees DrMorris IR at [facility] 8/12 w/ CTA that looked good- no resid aneurysm, distal flow appeared good & he didn't rec any changes; he requested that we do her f/u CTA & MRI Brain in [place] 9/14=> good flow thru the stented basilar art, no recurrent aneurysm, no recurrent acoustic neuroma;  Then she was [facility] at [facility] 2/15 w/ TIA & MRI/MRA Brain showed acute infarct in right occipital lobe, otherw stable;  Subsequent f/u by [facility] Neurology- no change in meds, rec quit smoking and control of BP, BS, Chol...    Hyperlipid> on Prav40; tol well & FLP 11/17 shows TChol  169, TG 158, HDL 46, LDL 91; continue same + better diet...    Hypothyroid> on Levothy50 now;  Labs 11/16 on Levothy75 showed TSH=0.22, Labs 11/17 showed TSH=0.20 so we decr her to 72mcg/d;  Labs 10/23/16 shows TSH=6.28 &  reminded to take it Qam by itself & don't eat for 58min.    DJD/ FM> she is managing quite well w/ prn Tramadol & Tylenol; prev CWP resolved, now c/o intermittent left arm discomfort...    Osteopenia> she had min osteopenia on BMD several yrs ago & takes Calcium, MVI, Vit D 1000u daily; f/u BMD 2/15 showed TScores -0.6 in Spine, and -1.6 in right FemNeck; rec to stay on supplements & wt bearing exercise...    RLS> on Mirapex 0.25mg  Qhs as needed which continues to help she says...     Acoustic Neuroma> resected by DrBrowne [facility] 2002; MRI 2/11 continued to look good- post op changes, no recurrent tumor seen; f/u MRI here 9/14 (& [facility] 2/15) continues to look good- no evid for recurrent or resid tumor.    Headaches> s/p eval by Neuro, DrJaffe- 9/16 note reviewed, MRI w/o acute changes, Cspine spondy noted w/ some spinal stenosis, treated w/ Pred taper & improved; she tells me that she has stopped seeing Neuro because she was INTOL to all his meds uses to rx her HAs, she notes only the Tramadol+Tylenol regimen seems to help...    Anxiety> on Tranxene as needed... EXAM shows Afeb, VSS, O2sat=98% on RA;  HEENT- neg, mallampati1;  Chest- clear w/o w/r/r;  Heart- RR w/o m/r/g;  Abd- soft, nontender, neg;  Ext- VI w/o c/c/e;  Neuro- no focal neuro deficits...  LABS 10/23/16>  TSH= 6.28 & she is reminded to take it ist thing in the AM on empty stomach w/o other meds, & don't eat/ drink for 45 min... IMP/PLAN>>  Sharlette is stable given her severe underlying dis & continued smoking habit;  She is asked to quit all smoking, take meds regularly as discussed & we gave her the PREVNAR-13 vaccination today;  We plan ROV recheck in 56mo w/ CXR, EKG, FASTING blood work...   ~  April 25, 2017:   62mo ROV & Ana Santos reports a good interval- feeling well w/o new complaints or concerns; she notes that intermittent HAs persist but the Tramadol + Tylenol works fine & she reiterates that she was INTOL to ALL preventive meds tried by [facility] (Neuro) previously;  She is still smking ~4cig/d and can't vs won't quit;  She denies cough, sputum, hemoptysis, and states no change in her DOE/ SOB; she exercises w/ yard work pushing wheel barrow all day she says; also denies CP, palpit, dizzy, edema... We reviewed the following medical problems during today's office visit >>     COPD> still smoking 4cig/d she says & can't vs won't quit; not on meds, denies cough/ sputum/ hemoptysis/ SOB/ ch in DOE, etc; CXR today= COPD, sl incr markings, NAD; Spirometry 11/16 shows mild obstructive dis (FEV1=1.69, 88%)- must quit all smoking!     Palpit> this was a new complaint 1/15> w/u DrArida was neg (2DEcho, Cath) & symptoms improved w/ Metoprolol25Bid- asked to STOP SMOKING & avoid caffeine etc; Care Everywhere shows 30d Holter w/ PACs/ PVCs/ no AFib.    Cerebrovasc dis/ Aneurysm> on ASA325, Plavix75; she sees DrMorris IR at [facility] 8/12 w/ CTA that looked good- no resid aneurysm, distal flow appeared good & he didn't rec any changes; he requested that we do her f/u CTA & MRI Brain in [place] 9/14=> good flow thru the stented basilar art, no recurrent aneurysm, no recurrent acoustic neuroma;  Then she was [facility] at [facility] 2/15 w/ TIA & MRI/MRA Brain showed acute infarct in right occipital lobe, otherw stable;  Subsequent f/u by [facility] Neurology- no change in meds, rec quit smoking and control of BP, BS, Chol...    Hyperlipid> on Prav40; tol well & FLP 11/18 shows TChol 139, TG 107, HDL 49, LDL 69; continue same + diet...    Hypothyroid> on Levothy50;  Labs 10/23/16 shows TSH=6.28 & reminded to take it Qam by itself & don't eat for 53min; Labs today shows TSH=10.82 & we rec incr Levothy back to 26mcg/d...    DJD/ FM> she is managing  quite well w/ prn Tramadol & Tylenol; prev CWP resolved, now c/o intermittent left arm discomfort...    Osteopenia> she had min osteopenia on BMD several yrs ago & takes Calcium, MVI, Vit D 1000u daily; f/u BMD 2/15 showed TScores -0.6 in Spine, and -1.6 in right FemNeck; rec to stay on supplements & wt bearing exercise...    RLS> on Mirapex 0.25mg  Qhs as needed which continues to help she says...     Acoustic Neuroma> resected by DrBrowne [facility] 2002; MRI 2/11 continued to look good- post op changes, no recurrent tumor seen; f/u MRI here 9/14 (& [facility] 2/15) continues to look good- no evid for recurrent or resid tumor.    Headaches> s/p eval by Neuro, DrJaffe- 9/16 note reviewed, MRI w/o acute changes, Cspine spondy noted w/ some spinal stenosis, treated w/ Pred taper & improved; she tells me that she has stopped seeing Neuro because she was INTOL to all his meds uses to rx her HAs, she notes only the Tramadol+Tylenol regimen seems to help...    Anxiety> on Tranxene as needed... EXAM shows Afeb, VSS, O2sat=97% on RA;  HEENT- neg, mallampati1;  Chest- clear w/o w/r/r;  Heart- RR w/o m/r/g;  Abd- soft, nontender, neg;  Ext- VI w/o c/c/e;  Neuro- no focal neuro deficits...  CXR 04/25/17 (independently reviewed by me in the PACS system) showed norm heart size, aortic atherosclerosis, coarse interstitial markings bilat- NAD, mild multilevel DDD in Tspine...   LABS 04/25/17>  FLP- at goals on Prev40;  Chems- wnl w/ Cr0.82;  CBC- wnl w/ Hg=12.4;  TSH on Levothy50 =10.82 & we rec incr back to 61mcg/d IMP/PLAN>>  Ana Santos remains stable but continues to smoke ~4cig/d & she is again implored to quit!  Give 2018 FLU vaccine today;  Reminded to stay as active as poss w/ home exercises during the winter; we plan ROV recheck in 93mo, sooner if needed prn...          Problem List:   COPD (ICD-496) - she has a min smoker's cough, sm amt beige sputum... ~  CXR 5/08 w/ chr changes, LLL scar, NAD. ~  CXR 7/09  showed norm hrt size, clear lungs, NAD. ~  CXR 9/10 showed NAD. ~  CXR 10/11 showed chr changes, NAD. ~  CXR 12/12 showed COPD, NAD. ~  CXR 4/13 showed normal heart size, incr interstitial markings, NAD. ~  CXR 1/15 showed norm heart size, COPD/Emphysema w/ scarring in lingula, NAD. ~  CXR 2/16 showed norm heart size, linear atx vs scarring left base, otherw neg- NAD ~  Spirometry 04/22/15>  FVC=2.46 (97%), FEV1=1.69 (88%), %1sec=69%, mid-flows reduced at 64% predicted;  This is c/w mild airflow obstruction and GOLD Stage 1 COPD  CIGARETTE SMOKER (ICD-305.1) - can't vs won't quit and not interested in smoking cessation help... now she states she's decr to ~1/4-1/2 ppd but can't seem to improve from there... ~  She understands the critical need to quit smoking from the COPD/Pulm, Cardiac, &  Vascular/Stroke standpoints;  She declines offers for smoking cessation help, chantix, nicotine replacement, etc... ~  2/15:  She was hosp at [facility] w/ Diplopia & found to have a right occipital stroke=> she has decr smoking to <1cig/d & encouraged to quit completely!!! ~  5/15 to 11/15:  She is still smoking 3-4cig/d she says & I have begged her to quit, discussed nicotine replacement rx & alternatives... ~  2/16: she is still smoking <1/2ppd she says but again declines smoking cessation help... ~  5/17: she has decr to 1-2 cig/d she says, reminded of the need to quit completely!  ?MITRAL VALVE PROLAPSE (ICD-424.0) >> mild MVP seen on cath 1995 by DrStuckey... Hx of CHEST PAIN (ICD-786.50) >> she had CP in the past and a neg cath 1995 (min coronary irregularities)...  Hx PALPITATIONS >> PVCs and PACs seen on EKG & Holter monitor in 2015; asked to stop all caffeine & nicotine... ~  CP eval 1995 w/ cath showing min 10% luminal irreg RCA & mild MVP, norm LVF... ~  T[place]bey was neg- no ischemia, no infarct, EF=66%... ~  baseline EKG showed NSR, NSSTTWA (poor R prog V1-V3)... ~  EKG 1/15 showed NSR,  rate60, poor R prog V1-3, NSSTTWA, NAD... ~  2DEcho 2/15 showed normal LV size & function w/ EF=55-60%, no regional wall motion abnormalities, Gr1DD, norm valves and RV ~  Holter Monitor 2/15 showed NSR, PACs, PVCs, and no pathologic arrhythmias (the skips correspond to her subjective SOB/palpit)...  ~  She had another 30d Holter per [facility] w/ Care Everywhere report indicating PACs & PVCs, no AFib... ~  EKG 2/16 showed NSR, rate80, PVCs noted, poor R prog V1=>3, NSSTTWA... She noted incr palpit & CP => referred to Cards for their review... ~  Seen by DrArida 2/16> Cath showed minor luminal irregularities w/o obstructive CAD, norm LVF w/ EF=55%, no signif MR; symptoms felt to be due to PVCs & Rx w/ Metoprolol improved her symptoms...  ~  2DEcho 3/16 showed norm LV size & function w/ EF=65-70%, no regional wall motion abn, Gr1DD, norm valves, norm RV & PAsys...  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 referred to [facility] w/ Angiogram confirmation & subseq stent assisted coiling of the aneurysm 11/10... subseq f/u angiogram 2/11 showed complete angiographic obliteration of the aneurysm, & patent stent in distal basilar art (there was a new 50%stenosis in the right PCA)... on ASA 325mg /d & PLAVIX restarted 2/11 w/ new gait abn & cbll infarct on MRI. ~  she saw DrMorris [facility]- interventional neuroradiology- w/ another arteriogram performed 8/11- it showed satis appearance of the basilar aneurysm stent & coiling w/ some prolapse of the coils into the P1 segm w/ mild compromise of the post cerebral art (flow looks adeq & he rec continued ASA/ Plavix therapy & f/u 30yr). ~  She had f/u DrMorris 8/12 w/ CTA that looked good- no resid aneurysm, distal flow appeared good & he didn't rec any changes- f/u planned 80yrs. ~  7/14: she is due for her f/u CTA & we will call [facility] regarding a follow up appt w/ DrMorris.. ~  9/14:  on  [id number], Plavix75; last f/u DrMorris IR at [facility] 8/12 w/ CTA that looked good- no resid aneurysm, distal flow appeared good & he didn't rec any changes; he requested that we do her f/u CTA & MRI Brain here in [place] 9/14=> good flow thru the  stented basilar art, no recurrent aneurysm, no recurrent acoustic neuroma...  ~  CTA Head 9/14 showed s/p stent assisted coiling of basilar tip aneurysm, no aneurysm recurrence, patent basilar art & left post cerebral art, mild atherosclerotic calcif in cavernous carotids w/o stenosis, post op right mastoid changes w/o recurrent tumor. ~  2/15: Adm to [facility] w/ Dip[lopia & eval revealed right occipital lobe infarct, everything else was stable, continued on her ASA325/ Plavix75=> we will refer to [facility] for their review of her situation... ~  3/15: she had a busy March> f/u w/ Neuro in [place] then had another TIA & presented to the ER; CTA Head & Neck showed similar to prev, no recurrent aneurysm, & they disch her on same ASA325 + Plavix75... ~  9/15: she had f/u DrHusseini at [facility], note reviewed on Care Everywhere- felt to be stable & no changes made> rec to quit smoking & control BP, BS, Chol; they released her to f/u prn... ~  11/15: pt upset that Neuro didn't address her c/o temporal HAs, falling, weak spells- we will refer to [facility] per her request...  ~  2/16: pt seen by DrJaffe> Hx basilar tip aneurysm s/p coiling 2010, right cbll infarct, right vestib schwannoma s/p surg 2002, recurrent TIA, & HAs; they reied Gabapentin=> HA improved but she developed CP & thought it was from this med therefore stopped it; she was advised to try MelatoninQhs & limit her Tramadol rx, f/u 28mo...  VENOUS INSUFFICIENCY (ICD-459.81) - she tells me that she saw DrKrush for vein surgery and treatment ("it's covered by my husb's insurance").  HYPERLIPIDEMIA (ICD-272.4) - now on PRAVASTATIN 40mg /d & tol well... we reviewed low chol/ low fat diet. ~  chart review shows TChol  ~ 200 range in the 80's and early 90's. ~  then TChol incr to ~250 range in late 90's and Lipitor started-  ~  FLP 5/08 on Lipitor 10mg /d showed TChol 164, TG 153, HDL 45, LDL 89. ~  in 2009 c/o no energy & arm discomfort she felt was from the Lip10- therefore [place]. ~  [place] 7/09 on diet alone showed TChol 234, TG 218, HDL 40, LDL 139... rec- start Prav40. ~  FLP 3/10 on Prav40 showed TChol 182, Tg 137, HDL 47, LDL 107 ~  FLP 6/11 on Prav40 showed TChol 162, TG 230, HDL 37, LDL 88 ~  FLP 6/12 on Prav40 showed TChol 177, TG 139, HDL 44, LDL 105... Contin diet, take med every day. ~  FLP 6/13 on Prav40 showed TChol 151, TG 138, HDL 42, LDL 81  ~  FLP 7/14 on Prav40 showed TChol 174, TG 68, HDL 58, LDL 103 ~  FLP 5/15 on Prav40 showed TChol 164, TG 120, HDL 49, LDL 91 ~  FLP 11/15 on Prav40 showed TChol 168, TG 129, HDL 43, LDL 99  ~  FLP 11/16 on Prav40 showed TChol 140, TG 112, HDL 43, LDL 75  HYPOTHYROIDISM (ICD-244.9) - stable on LEVOTHYROID 27mcg/d now... hx Hashimoto's thyroiditis in 1989 w/ markedly pos antimicrosomal antibodies and transient hyperthy labs...  ~  labs 5/08 showed TSH= 0.53... rec- continue Levo100/d. ~  labs 7/09 showed TSH= 0.07... rec- decr Levothy100 to 1/2 daily. ~  labs 3/10 showed TSH= 17.71... rec> incr back to 144mcg/d. ~  labs 9/10 showed TSH= 0.21... continue same. ~  labs 6/11 showed TSH= 0.10... rec decr Levo100 to 1/2 daily. ~  labs 10/11 on Levo50 showed TSH= 1.25 (FreeT3 & FreeT4 normal) ~  Labs 6/12 on Levo50 showed TSH= 12.56 ==> we will contact pt & pharm to determine if taking Levo100-1/2tab daily every day? & adjust. ~  Labs 12/12 on Levo75 showed TSH= 0.88... Continue same. ~  Labs 6/13 on Levo75 showed TSH= 0.71... Continue same. ~  Labs 7/14 on Levo75 showed TSH= 2.47 ~  Labs 5/15 on Levo75 showed TSH= 0.25 and we rec decr to Levothy50... ~  Labs 11/15 on Levo50 showed TSH= 9.91 and she is rec to go back on Synthroid75 ~  Labs 11/16 on Levo75  showed TSH=0.22 and rec to continue same for now... ~  Labs 5/17 on Levo75 showed TSH= 0.48  IRRITABLE BOWEL SYNDROME (ICD-564.1) - had colonoscopy by [facility] 11/06 that was WNL.Marland Kitchen. she notes some constipation & Rx w/ colase/ senakot-S OTC...  NEPHROLITHIASIS (ICD-592.0) - last stone passed in 1993, no known recurrence since then...  Hx of TENDINITIS, RIGHT ELBOW (ICD-727.09) Hx right shoulder pain w/ eval by DrNorris (MRI was planned butnever done & symptoms improved spont)... FIBROMYALGIA (ICD-729.1) - prev severe symptoms may have reflected a flair in her FM> but much improved on Pred Rx. ~  labs 9/10 showed Sed= 24 ~  labs 6/11 showed Sed= 30 ~  labs 10/11 showed CBC=norm, Chems=norm, TFTs=norm, Sed=21, RF=neg, ANA+1:40 speckled... ~  12/11:  pt saw DrTruslow & his note is pending- she indicates he stopped her Pred, & gave her shot in shoulder.  OSTEOPENIA (ICD-733.90) - BMD here 7/09 showed TScores -0.8 in Spine, and -1.2 in right FemNeck... rec to take Calcium, MVI, Vit D... ~  BMD repeated 2/15 & showed TScores -0.6 in Spine, and -1.6 in right FemNeck; rec to stay on Calcium, MVI, VitD, & wt bearing exercise...  VITAMIN D DEFICIENCY (ICD-268.9) ~  labs 7/09 showed Vit D level = 16... rec- start Vit D 50K weekly. ~  labs 3/10 showed Vit D level = 69... rec> change to 1000 u daily. ~  labs 6/11 showed Vit D level = 55... Continue same. ~  Labs 6/13 showed Vit D level = 53... Continue same. 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 no known recurrence... ~  MRI Brain 2/07 showed s/p translabyrinthine approach to right acoustic neuroma w/o recurrence, NAD... ~  MRI Brain 10/10 showed no recurrence of tumor, but 4-12mm basilar tip aneurysm detected & Rx as above. ~  MRI Brain 2/11 showed no recurrence/ post op changes, endovasc oblit of basilar aneurysm, cerebellar lacunar infarct & 50% stenosis of right  PCA... PLAVIX restarted... she had review by [facility] IR & Neurology (as above)... ~  We discussed f/u MRI since DrBrowne hasn't seen her since 2011 (?he turned her over to DrMorris)... ~  9/14:  MRI Brain showed no evid of resid or recurrent vestib schwannoma on the right, post surg changes w/ scarring in the int auditory canal, ols sm vessel cerebellar infarctions are stable, mild atrophy & sm vessel dis, prev coiled basilar tip aneurysm, no acute insults...  ~  MRI/ MRA Brain 2/15 at [facility] showed acute infarct in right occipital lobe, chronic infarcts in cerebellum bilat, aneurysm coiling of basilar art tip and stent in basilar art.. ~  CT Angio Head & Neck 3/15 in [place] showed 40% narrowing of prox left subclavian art; 40% RICAstenosis & 76% LICAstenosis; aneurysm coiling & stenting of basilar tip aneurysm...  RLS symptoms >> she complained 1/14 about  new onset RLS symptoms- discussed trial Mirapex 0.25mg  prn...  ANXIETY (ICD-300.00) - uses CHLORAZEPATE 7.5mg Tid... she gets claustrophobic and needs sedation before MRIs.   Past Surgical History:  Procedure Laterality Date  . ANEURYSM COILING  11-10   Dr. Estanislado Pandy  . BREAST LUMPECTOMY    . LEFT HEART CATHETERIZATION WITH CORONARY ANGIOGRAM N/A 08/05/2014   Performed by Wellington Hampshire, MD at [facility] CATH LAB  . TOTAL ABDOMINAL HYSTERECTOMY    . TRANSLABYRINTHINE PROCEDURE  2002   [facility] Dr. Vicie Mutters    Outpatient Encounter Medications as of 04/25/2017  Medication Sig  . acetaminophen (TYLENOL) 500 MG tablet Take 500 mg by mouth every 6 (six) hours as needed for mild pain.   Marland Kitchen aspirin 325 MG tablet Take 325 mg by mouth daily.    . calcium gluconate 500 MG tablet Take 1 tablet by mouth daily.  . cholecalciferol (VITAMIN D) 1000 UNITS tablet Take 1,000 Units by mouth daily.    . clopidogrel (PLAVIX) 75 MG tablet TAKE 1 TABLET DAILY  . clorazepate (TRANXENE) 7.5 MG tablet Take 1 tablet (7.5 mg total) by mouth 3 (three) times daily as needed  for anxiety.  . hydroxypropyl methylcellulose / hypromellose (ISOPTO TEARS / GONIOVISC) 2.5 % ophthalmic solution Place 1 drop into both eyes as needed for dry eyes.  Marland Kitchen levothyroxine (SYNTHROID, LEVOTHROID) 50 MCG tablet TAKE 1 TABLET DAILY BEFORE BREAKFAST  . metoprolol tartrate (LOPRESSOR) 25 MG tablet TAKE 1 TABLET TWICE A DAY  . pramipexole (MIRAPEX) 0.25 MG tablet TAKE 1 TABLET AT BEDTIME FOR RESTLESS LEG SYMPTOMS  . pravastatin (PRAVACHOL) 40 MG tablet TAKE 1 TABLET DAILY  . traMADol (ULTRAM) 50 MG tablet Take 1 tablet (50 mg total) by mouth 3 (three) times daily as needed for moderate pain.  Marland Kitchen levothyroxine (SYNTHROID) 75 MCG tablet Take 1 tablet (75 mcg total) daily before breakfast by mouth.   No facility-administered encounter medications on file as of 04/25/2017.     Allergies  Allergen Reactions  . Atorvastatin Other (See Comments)     Lipitor caused arm pain (3/09)  . Hydrocodone-Acetaminophen Other (See Comments)    hallucinations  . Morphine Other (See Comments)    hallucinations  . Nortriptyline     Caused nausea and vomiting and shaking, kept her up all night  . Topamax [Topiramate] Nausea And Vomiting    *dizziness*    Immunization History  Administered Date(s) Administered  . Influenza Split 06/09/2011, 03/29/2012  . Influenza Whole 03/12/2009, 03/29/2010  . Influenza, High Dose Seasonal PF 03/17/2016, 04/25/2017  . Influenza,inj,Quad PF,6+ Mos 05/22/2013, 04/17/2014, 04/22/2015  . Pneumococcal Conjugate-13 10/23/2016  . Pneumococcal Polysaccharide-23 12/18/2007    Current Medications, Allergies, Past Medical History, Past Surgical History, Family History, and Social History were reviewed in [employer] record.   Review of Systems         See HPI - all other systems neg except as noted... The patient complains of dyspnea on exertion, recent palpitations & HAs.  The patient denies anorexia, fever, weight loss, weight gain, vision  loss, decreased hearing, hoarseness, chest pain, syncope, peripheral edema, prolonged cough, hemoptysis, abdominal pain, melena, hematochezia, severe indigestion/heartburn, hematuria, incontinence, muscle weakness, suspicious skin lesions, transient blindness, difficulty walking, depression, unusual weight change, abnormal bleeding, enlarged lymph nodes, and angioedema.     Objective:   Physical Exam     WD, WN, 76 y/o WF in NAD... GENERAL:  Alert & oriented; pleasant & cooperative... HEENT:  [place]/AT, EOM-wnl, PERRLA, EACs-clear, TMs-  some scarring on right, NOSE-clear, THROAT-clear & wnl. NECK:  Supple w/ fairROM; no JVD; normal carotid impulses w/o bruits; palp thyroid, no nodules felt; no lymphadenopathy. CHEST:  Clear to P & A; without wheezes/ rales/ or rhonchi heard... HEART:  Regular Rhythm; gr1/6 SEM,  no irregularity, rubs or gallops heard... ABDOMEN:  Soft & nontender; normal bowel sounds; no organomegaly or masses detected. EXT: without deformities, mild arthritic changes; no varicose veins/ venous insuffic/ or edema... +trigger points about the neck/ shoulders w/ decr ROM right shoulder w/ pain... NEURO:  CN's intact; no focal neuro deficits... gait & station are normal. DERM:  No lesions noted; no rash etc...  RADIOLOGY DATA:  Reviewed in the EPIC EMR & discussed w/ the patient...  LABORATORY DATA:  Reviewed in the EPIC EMR & discussed w/ the patient...   Assessment & Plan:    10/23/16>   Ana Santos is stable given her severe underlying dis & continued smoking habit;  She is asked to quit all smoking, take meds regularly as discussed & we gave her the PREVNAR-13 vaccination today;  We plan ROV recheck in 14mo w/ CXR, EKG, FASTING blood work 04/25/17>   Ana Santos remains stable but continues to smoke ~4cig/d & she is again implored to quit!  Give 2018 FLU vaccine today;  Reminded to stay as active as poss w/ home exercises during the winter; we plan ROV recheck in 75mo, sooner if needed  prn...   COPD/ Smoker>  She notes min cough/ sputum/ etc; desperately needs to quit smokng- offered counseling, medications, etc but she declines...  Hx mild MVP>  No MVP seen on 2DEcho 2/15, mild MVP was noted on cath 1995 by DrStuckey...  PALPITATIONS>> Prev eval w/ CXR, EKG, 2DEcho & Holter monitor=> all studies OK/NEG as above (x PACs/ PVCs); we stressed the importance of no nicotine, no caffeine, etc... 2/16> she presented w/ incr symptoms over the last month; rec to start Metop25- 1/2Bid & refer to Cards for their review... 2/16> seen by DrArida w/ Cath, repeat 2DEcho- both neg & palpit/ PVCs improved w/ Metoprolol rx...  Hx right sided Acoustic Neuroma- s/p surg 2002 at [facility] DrBrowne> no known recurrence to date including MRI 9/14...  CEREBROVASC Dis w/ basilar tip aneurysm stent & coiling 11/10 by DrDeveshwar; f/u arteriogram 2/11 at [facility] & 8/11 at [facility] showed some compromise of the post circ w/ prolapse of the coils into the P1 segm;  Note: MRI 2/11 done for new gait abn showed cbll infarct & ASA/ Plavix restarted and she did home phys therapy w/ improvement;  Repeat IR eval 8/12 w/ CTA showed oblit of the aneurysm & distal flow appears good as well- they rec repeat studies in 2 yrs=> Done 9/14 in [place] & no aneurysm seen, stent ok w/ good flow thru the area, felt to be stable... Right Occipital Lobe Infarct 2/15>  As above, she remains on ASA325 & Plavix75, we will refer to [facility] Neurology division for their recommendations... Another TIA 3/15>  eval via ER w/ CTA Head & Neck- stable, no changes made to meds- rec to continue ASA/ Plavix, AND QUIT SMOKING.... She was released by [facility] Neuro- DrHusseini & asked to quit smoking, continue ASA/Plavix, keep BP/ BS/ Chol under control... 11/15> pt c/o HAs, falls, weakness & requests Neuro 2nd opinion at [facility]... 2/16> seen by [facility] for HAs & neuro 2nd opinion...  HYPERLIPID>  On Prav40 + diet;  FLP looks OK but needs better diet & take med  Qhs.Marland KitchenMarland Kitchen  HYPOTHYROID>  Back on Levothy 67mcg/d now w/ TSH= 10.82 on the 47mcg/d dose...  IBS w/ Constip>  She uses OTC laxatives as needed & we discussed Miralax/ Senakot-S/ etc...  ORTHO>  Prev right shoulder discomfort improved spont & MRI was never done; currently uses Tramadol Prn... ~  2017> c/o neck & back pain; DrJaffe sent her to [facility] & he has sched back injections...  OSTEOPENIA/ Vit D defic>  Stable on Calcium, MVI, Vit D supplement; due for f/u BMD=> pending...  RLS symptoms>  trial Mirapex 0.25mg  hs prn...  Hx Chronic daily HAs>>  HAs improved spontaneously- on Tramadol & Tylenol w/ some relief of pain; she tried Topamax but intol w/ dizzy & nausea... 11/15> presented w/ incr HAs and referred to Neuro; they tried Neurontin w/ decr HAs but intol she says...  ANXIETY>  Uses Tranxene Prn (she has some claustrophobia & needs sedation before MRIs- she doesn't remember the 2/11 MRI being done!).Marland KitchenMarland Kitchen     Medication List        Accurate as of 04/25/17 11:59 PM. Always use your most recent med list.          acetaminophen 500 MG tablet Commonly known as:  TYLENOL   aspirin 325 MG tablet   calcium gluconate 500 MG tablet   cholecalciferol 1000 units tablet Commonly known as:  VITAMIN D   clopidogrel 75 MG tablet Commonly known as:  PLAVIX TAKE 1 TABLET DAILY   clorazepate 7.5 MG tablet Commonly known as:  TRANXENE Take 1 tablet (7.5 mg total) by mouth 3 (three) times daily as needed for anxiety.   hydroxypropyl methylcellulose / hypromellose 2.5 % ophthalmic solution Commonly known as:  ISOPTO TEARS / GONIOVISC   * levothyroxine 75 MCG tablet Commonly known as:  SYNTHROID                                      Take 1 tablet (75 mcg total) daily before breakfast by mouth.   metoprolol tartrate 25 MG tablet Commonly known as:  LOPRESSOR TAKE 1 TABLET TWICE A DAY   pramipexole 0.25 MG tablet Commonly known as:  MIRAPEX TAKE 1 TABLET AT BEDTIME FOR RESTLESS LEG  SYMPTOMS   pravastatin 40 MG tablet Commonly known as:  PRAVACHOL TAKE 1 TABLET DAILY   traMADol 50 MG tablet Commonly known as:  ULTRAM Take 1 tablet (50 mg total) by mouth 3 (three) times daily as needed for moderate pain.      * This list has 2 medication(s) that are the same as other medications prescribed for you. Read the directions carefully, and ask your doctor or other care provider to review them with you.          Where to Get Your Medications    These medications were sent to [facility], [place]  [address] [place] [address]   Phone:  [phone]   levothyroxine 75 MCG tablet

## 2017-06-14 ENCOUNTER — Other Ambulatory Visit: Payer: Self-pay | Admitting: *Deleted

## 2017-06-14 ENCOUNTER — Telehealth: Payer: Self-pay | Admitting: Pulmonary Disease

## 2017-06-14 MED ORDER — TRAMADOL HCL 50 MG PO TABS: 50.0000 mg | ORAL_TABLET | Freq: Three times a day (TID) | ORAL | 1 refills | Status: DC | PRN

## 2017-06-14 NOTE — Telephone Encounter (Signed)
Refill request on Tramadol 50 mg sent to [facility].  Last OV 04/25/2017 Last RX 07/17/2016  Current Outpatient Medications on File Prior to Visit  Medication Sig Dispense Refill  . acetaminophen (TYLENOL) 500 MG tablet Take 500 mg by mouth every 6 (six) hours as needed for mild pain.     Marland Kitchen aspirin 325 MG tablet Take 325 mg by mouth daily.      . calcium gluconate 500 MG tablet Take 1 tablet by mouth daily.    . cholecalciferol (VITAMIN D) 1000 UNITS tablet Take 1,000 Units by mouth daily.      . clopidogrel (PLAVIX) 75 MG tablet TAKE 1 TABLET DAILY 90 tablet 1  . clorazepate (TRANXENE) 7.5 MG tablet Take 1 tablet (7.5 mg total) by mouth 3 (three) times daily as needed for anxiety. 90 tablet 5  . hydroxypropyl methylcellulose / hypromellose (ISOPTO TEARS / GONIOVISC) 2.5 % ophthalmic solution Place 1 drop into both eyes as needed for dry eyes.    Marland Kitchen levothyroxine (SYNTHROID) 75 MCG tablet Take 1 tablet (75 mcg total) daily before breakfast by mouth. 90 tablet 2  . levothyroxine (SYNTHROID, LEVOTHROID) 50 MCG tablet TAKE 1 TABLET DAILY BEFORE BREAKFAST 90 tablet 3  . metoprolol tartrate (LOPRESSOR) 25 MG tablet TAKE 1 TABLET TWICE A DAY 180 tablet 2  . pramipexole (MIRAPEX) 0.25 MG tablet TAKE 1 TABLET AT BEDTIME FOR RESTLESS LEG SYMPTOMS 90 tablet 1  . pravastatin (PRAVACHOL) 40 MG tablet TAKE 1 TABLET DAILY 90 tablet 2  . traMADol (ULTRAM) 50 MG tablet Take 1 tablet (50 mg total) by mouth 3 (three) times daily as needed for moderate pain. 270 tablet 0   No current facility-administered medications on file prior to visit.    Allergies  Allergen Reactions  . Atorvastatin Other (See Comments)     Lipitor caused arm pain (3/09)  . Hydrocodone-Acetaminophen Other (See Comments)    hallucinations  . Morphine Other (See Comments)    hallucinations  . Nortriptyline     Caused nausea and vomiting and shaking, kept her up all night  . Topamax [Topiramate] Nausea And Vomiting    *dizziness*

## 2017-06-15 NOTE — Telephone Encounter (Signed)
Tramadol [facility] on 06/14/17.

## 2017-06-15 NOTE — Telephone Encounter (Signed)
[place] did this Rx get faxed in?

## 2017-07-20 DIAGNOSIS — M25532 Pain in left wrist: Secondary | ICD-10-CM | POA: Diagnosis not present

## 2017-09-18 ENCOUNTER — Telehealth: Payer: Self-pay | Admitting: Pulmonary Disease

## 2017-09-18 NOTE — Telephone Encounter (Signed)
SN please advise on refills for Tramadol 50mg  and Pramipexole 0.25 mg.   Current Outpatient Medications on File Prior to Visit  Medication Sig Dispense Refill  . acetaminophen (TYLENOL) 500 MG tablet Take 500 mg by mouth every 6 (six) hours as needed for mild pain.     Marland Kitchen aspirin 325 MG tablet Take 325 mg by mouth daily.      . calcium gluconate 500 MG tablet Take 1 tablet by mouth daily.    . cholecalciferol (VITAMIN D) 1000 UNITS tablet Take 1,000 Units by mouth daily.      . clopidogrel (PLAVIX) 75 MG tablet TAKE 1 TABLET DAILY 90 tablet 1  . clorazepate (TRANXENE) 7.5 MG tablet Take 1 tablet (7.5 mg total) by mouth 3 (three) times daily as needed for anxiety. 90 tablet 5  . hydroxypropyl methylcellulose / hypromellose (ISOPTO TEARS / GONIOVISC) 2.5 % ophthalmic solution Place 1 drop into both eyes as needed for dry eyes.    Marland Kitchen levothyroxine (SYNTHROID) 75 MCG tablet Take 1 tablet (75 mcg total) daily before breakfast by mouth. 90 tablet 2  . levothyroxine (SYNTHROID, LEVOTHROID) 50 MCG tablet TAKE 1 TABLET DAILY BEFORE BREAKFAST 90 tablet 3  . metoprolol tartrate (LOPRESSOR) 25 MG tablet TAKE 1 TABLET TWICE A DAY 180 tablet 2  . pramipexole (MIRAPEX) 0.25 MG tablet TAKE 1 TABLET AT BEDTIME FOR RESTLESS LEG SYMPTOMS 90 tablet 1  . pravastatin (PRAVACHOL) 40 MG tablet TAKE 1 TABLET DAILY 90 tablet 2  . traMADol (ULTRAM) 50 MG tablet Take 1 tablet (50 mg total) by mouth 3 (three) times daily as needed for moderate pain. 90 tablet 1   No current facility-administered medications on file prior to visit.    Allergies  Allergen Reactions  . Atorvastatin Other (See Comments)     Lipitor caused arm pain (3/09)  . Hydrocodone-Acetaminophen Other (See Comments)    hallucinations  . Morphine Other (See Comments)    hallucinations  . Nortriptyline     Caused nausea and vomiting and shaking, kept her up all night  . Topamax [Topiramate] Nausea And Vomiting    *dizziness*

## 2017-09-19 MED ORDER — TRAMADOL HCL 50 MG PO TABS: 50.0000 mg | ORAL_TABLET | Freq: Three times a day (TID) | ORAL | 1 refills | Status: DC | PRN

## 2017-09-19 MED ORDER — PRAMIPEXOLE DIHYDROCHLORIDE 0.25 MG PO TABS: ORAL_TABLET | ORAL | 1 refills | Status: DC

## 2017-09-19 NOTE — Telephone Encounter (Signed)
Per SN- Ok for refills of Pramipexole 0.25mg , take 1 tab at bedtime, for restless leg, #90 with 1 refill, and Tramadol 50mg , TID as needed moderate pain, #90 with 1 refill, [facility].  Left message for Ms. Krauss that refills have been sent to [facility] and for her to call with any questions. Prescriptions [facility] to [facility].  Nothing further needed at this time.

## 2017-09-19 NOTE — Addendum Note (Signed)
Addended by: Elton Sin on: 09/19/2017 09:48 AM   Modules accepted: Orders

## 2017-10-17 ENCOUNTER — Other Ambulatory Visit: Payer: Self-pay | Admitting: Pulmonary Disease

## 2017-10-23 ENCOUNTER — Encounter: Payer: Self-pay | Admitting: Pulmonary Disease

## 2017-10-23 ENCOUNTER — Other Ambulatory Visit (INDEPENDENT_AMBULATORY_CARE_PROVIDER_SITE_OTHER): Payer: Medicare Other

## 2017-10-23 ENCOUNTER — Ambulatory Visit (HOSPITAL_COMMUNITY)
Admission: RE | Admit: 2017-10-23 | Discharge: 2017-10-23 | Disposition: A | Discharge status: Home / Self Care | Payer: Medicare Other | Source: Ambulatory Visit | Attending: Pulmonary Disease | Admitting: Pulmonary Disease

## 2017-10-23 ENCOUNTER — Ambulatory Visit (INDEPENDENT_AMBULATORY_CARE_PROVIDER_SITE_OTHER): Payer: Medicare Other | Admitting: Pulmonary Disease

## 2017-10-23 VITALS — BP 132/64 | HR 52 | Temp 98.0°F | Ht 62.0 in | Wt 112.0 lb

## 2017-10-23 DIAGNOSIS — R935 Abnormal findings on diagnostic imaging of other abdominal regions, including retroperitoneum: Secondary | ICD-10-CM | POA: Diagnosis not present

## 2017-10-23 DIAGNOSIS — E559 Vitamin D deficiency, unspecified: Secondary | ICD-10-CM

## 2017-10-23 DIAGNOSIS — D333 Benign neoplasm of cranial nerves: Secondary | ICD-10-CM | POA: Diagnosis not present

## 2017-10-23 DIAGNOSIS — I663 Occlusion and stenosis of cerebellar arteries: Secondary | ICD-10-CM | POA: Diagnosis not present

## 2017-10-23 DIAGNOSIS — I679 Cerebrovascular disease, unspecified: Secondary | ICD-10-CM

## 2017-10-23 DIAGNOSIS — F411 Generalized anxiety disorder: Secondary | ICD-10-CM

## 2017-10-23 DIAGNOSIS — J432 Centrilobular emphysema: Secondary | ICD-10-CM | POA: Insufficient documentation

## 2017-10-23 DIAGNOSIS — R1031 Right lower quadrant pain: Secondary | ICD-10-CM | POA: Diagnosis present

## 2017-10-23 DIAGNOSIS — R141 Gas pain: Secondary | ICD-10-CM | POA: Diagnosis not present

## 2017-10-23 DIAGNOSIS — I700 Atherosclerosis of aorta: Secondary | ICD-10-CM | POA: Insufficient documentation

## 2017-10-23 DIAGNOSIS — J449 Chronic obstructive pulmonary disease, unspecified: Secondary | ICD-10-CM

## 2017-10-23 DIAGNOSIS — J4489 Other specified chronic obstructive pulmonary disease: Secondary | ICD-10-CM

## 2017-10-23 DIAGNOSIS — G2581 Restless legs syndrome: Secondary | ICD-10-CM

## 2017-10-23 DIAGNOSIS — E782 Mixed hyperlipidemia: Secondary | ICD-10-CM

## 2017-10-23 DIAGNOSIS — Z9049 Acquired absence of other specified parts of digestive tract: Secondary | ICD-10-CM | POA: Insufficient documentation

## 2017-10-23 DIAGNOSIS — F17200 Nicotine dependence, unspecified, uncomplicated: Secondary | ICD-10-CM

## 2017-10-23 DIAGNOSIS — E278 Other specified disorders of adrenal gland: Secondary | ICD-10-CM | POA: Insufficient documentation

## 2017-10-23 DIAGNOSIS — I6350 Cerebral infarction due to unspecified occlusion or stenosis of unspecified cerebral artery: Secondary | ICD-10-CM

## 2017-10-23 LAB — COMPREHENSIVE METABOLIC PANEL
ALBUMIN: 4.2 g/dL (ref 3.5–5.2)
ALT: 11 U/L (ref 0–35)
AST: 15 U/L (ref 0–37)
Alkaline Phosphatase: 114 U/L (ref 39–117)
BUN: 16 mg/dL (ref 6–23)
CALCIUM: 9.8 mg/dL (ref 8.4–10.5)
CHLORIDE: 105 meq/L (ref 96–112)
CO2: 25 mEq/L (ref 19–32)
Creatinine, Ser: 0.82 mg/dL (ref 0.40–1.20)
GFR: 71.89 mL/min (ref >60.00)
Glucose, Bld: 81 mg/dL (ref 70–99)
POTASSIUM: 3.9 meq/L (ref 3.5–5.1)
SODIUM: 141 meq/L (ref 135–145)
Total Bilirubin: 0.4 mg/dL (ref 0.2–1.2)
Total Protein: 8 g/dL (ref 6.0–8.3)

## 2017-10-23 LAB — CBC WITH DIFFERENTIAL/PLATELET
BASOS PCT: 0.6 % (ref 0.0–3.0)
Basophils Absolute: 0.1 10*3/uL (ref 0.0–0.1)
EOS ABS: 0.3 10*3/uL (ref 0.0–0.7)
Eosinophils Relative: 2.8 % (ref 0.0–5.0)
HEMATOCRIT: 40.7 % (ref 36.0–46.0)
Hemoglobin: 13.5 g/dL (ref 12.0–15.0)
LYMPHS PCT: 24.1 % (ref 12.0–46.0)
Lymphs Abs: 2.9 10*3/uL (ref 0.7–4.0)
MCHC: 33.3 g/dL (ref 30.0–36.0)
MCV: 86.9 fl (ref 78.0–100.0)
Monocytes Absolute: 0.6 10*3/uL (ref 0.1–1.0)
Monocytes Relative: 5.3 % (ref 3.0–12.0)
NEUTROS ABS: 8.2 10*3/uL — AB (ref 1.4–7.7)
Neutrophils Relative %: 67.2 % (ref 43.0–77.0)
PLATELETS: 331 10*3/uL (ref 150.0–400.0)
RBC: 4.68 Mil/uL (ref 3.87–5.11)
RDW: 14.1 % (ref 11.5–15.5)
WBC: 12.2 10*3/uL — ABNORMAL HIGH (ref 4.0–10.5)

## 2017-10-23 LAB — SEDIMENTATION RATE: Sed Rate: 68 mm/hr — ABNORMAL HIGH (ref 0–30)

## 2017-10-23 LAB — TSH: TSH: 0.44 u[IU]/mL (ref 0.35–4.50)

## 2017-10-23 MED ORDER — IOHEXOL 300 MG/ML  SOLN
30.0000 mL | Freq: Once | INTRAMUSCULAR | Status: AC | PRN
  Administered 2017-10-23: 30 mL via ORAL

## 2017-10-23 MED ORDER — IOPAMIDOL (ISOVUE-300) INJECTION 61%
INTRAVENOUS | Status: AC
  Administered 2017-10-23: 100 mL
  Filled 2017-10-23: qty 100

## 2017-10-23 NOTE — Patient Instructions (Signed)
Today we updated your med list in our EPIC system...    Continue your current medications the same...  We discussed your Right sided abd pain & the need for further eval ASAP>    Today we did follow up blood work...    We will arrange for a CT scan of your Abdomen & pelvis...  We will contact you w/ the results when available...   In the meanwhile=> try a heating pad to your abd for the discomfort, use your TRAMADOL + TYLENOL up to 3 times/d.  Call for any questions or change in your symptoms...  Follow up & the next step depends on these results.Marland KitchenMarland Kitchen

## 2017-10-23 NOTE — Progress Notes (Signed)
Subjective:    Patient ID: Ana Santos, female    DOB: Jun 21, 1940, 77 y.o.   MRN: [id number]  HPI 77 y/o WF here for a follow up visit... she has multiple medical problems as reviewed below...   ~  SEE PREV EPIC NOTES FOR EARLIER DATA >>   LABS 6/13:  FLP- at goals on Prav40;  Chems- wnl;  CBC- wnl;  TSH=0.71;  VitD=53...  CXR 7/14> she forgot to go to the [place] for this film...   LABS 7/14:  FLP- looks ok on Prav40;  Chems- wnl;  CBC- wnl;  TSH=2.47;  VitD=67... ADDENDUM> DrMorris ([facility], Interventional radiology) insists that we obtain her f/u CTAngiogram & send it to him, we will also obtain f/u MRI Brain to assess her acoustic neuroma...  CTA Head 9/14 showed s/p stent assisted coiling of basilar tip aneurysm, no aneurysm recurrence, patent basilar art & left post cerebral art, mild atherosclerotic calcif in cavernous carotids w/o stenosis, post op right mastoid changes w/o recurrent tumor  MRI Brain 9/14 showed no evid of resid or recurrent vestib schwannoma on the right, post surg changes w/ scarring in the int auditory canal, ols sm vessel cerebellar infarctions are stable, mild atrophy & sm vessel dis, prev coiled basilar tip aneurysm, no acute insults... ADDENDUM> Letter from DrMorris 9/14 after review of the CTA indicates that everything is stable and flow is satisfactory (he is leaving [place] can assist in the future if needed).  CXR 1/15 showed norm heart size, COPD/Emphysema w/ scarring in lingula, NAD.Marland KitchenMarland Kitchen  EKG 1/15 showed NSR, rate60, poor R prog V1-3, NSSTTWA, NAD...  2DEcho 2/15 showed normal LV size & function w/ EF=55-60%, no regional wall motion abnormalities, Gr1DD, norm valves and RV...  Holter Monitor 2/15 showed NSR, PACs, PVCs, and no pathologic arrhythmias (the skips correspond to her subjective SOB/palpit)...  ~   August 12, 2013:  add-on visit post hospital check>  Ana Santos developed diplopia & blurry vision and was adm to [facility] 2/15 -  07/30/13> review of records indicated that her neuro exam was otherw WNL, MRI Brain showed a right occipital lobe infarct, and MRA showed no new abnormality- no recurrent aneurysm, patent sent & good flow in the PCA; symptom resolved spont & they continued her same meds (RSW546 & Plavix75); she has been able to cut down to 1cig/d and again we reviewed the critical need to quit smoking completely... CT Brain 07/27/13 [facility] showed prior basilar tip aneurysm repair, post surg changes in the right mastoid region, NAD...  MRI Brain 2/15 showed sm areas of acute infarct in right occipital lobe (new from 9/14 MRI); chr infarcts in the cerebellum bilat, basilar art stent & coiling of basilar tip aneurysm...  MRA Brain 2/15 showed no evid for recurrent basilar tip aneurysm, mild to mod narrowing of the right P1 segment, left P1 segment stented...  CDopplers 2/15 showed some plaque in the bulbs and ICAs bilat, <50% stenoses bilat noted...   EKG 2/15 showed NSR, rate63, poor R prog V1-3, NAD...  LABS 2/15 at [facility] showed> Chems- wnl w/ Cr=1.0;  CBC- wnl w/ Hg=13.5;  Abn UA w/ UTI evident...  ~  Ana Santos went to the ER 08/26/13 with another TIA characterized by diplopia & ataxia that last 96min or so; assoc mild HA, no focal neuro findings in ER; already on ASA325/ Plavix75 w/ good compliance; She denies any additional cerebral ischemic symptoms since the 3/15 ER visit; unfortunately still smoking; review of  chart shows NO documented AFib in her past...  EKG 3/15 showed NSR, rate67, poor R prog V1-3, otherw wnl...  CT Head 3/15 showed evid prior vertebral art aneurysm surg & post-op changes in right mastoid, NAD...  CTA Head & Neck 3/15 showed prev basilar tip aneurysm coiling & basilar stent, prior right mastoidectomy, vessels are patent & no recurrent aneurysm; neck showed apical scarring & emphysema, 40% left subclav stenosis, 40-50% bilat ICA stenoses, mild stenosis at the origin of the  vertebral as well...  LABS 5/15:  FLP- at goals on Prav40;  Chems & LFTs- wnl;  TSH= 0.25 on Levothy75 & rec to decr to 4mcg/d...   ~  April 17, 2014:  46mo ROV & Day is c/o HA- sharp pains in her temples, assoc w/ occas weak spells ("don't feel like getting OOB"), poor appetite, and occas falling (no known injury); she remains on ASA325 & Plavix75> she voiced discontent w/ Neuro eval at [facility], states she never received call from her doctor (Neuro- DrHusseini);  I reviewed Care Everywhere records- summary & last note 03/11/14 from DrHusseini> extensive note reviewed, pt stable on ASA/ Plavix and no changes made, she was asked to f/u prn;  Ana Santos's BP, Chol, BS- all well controlled but she is still smoking and can't vs won't quit;  She requests 2nd opinion from local [facility] due to her temporal HAs, falling, weak spells...  We reviewed the following medical problems during today's office visit >>     COPD> still smoking 1/2ppd & can't vs won't quit; not on meds, min cough/ sputum, denies hemoptysis/ SOB/ ch in DOE, etc; CXR 1/15= COPD, NAD; she MUST quit all smoking!    Palpit> new complaint 1/15> w/u in progress & asked to STOP SMOKING & avoid caffeine etc; Care Everywhere shows 30d Holter w/ PACs/ PVCs/ no AFib...    Cerebrovasc dis/ Aneurysm> on ASA325, Plavix75; she sees DrMorris IR at [facility] 8/12 w/ CTA that looked good- no resid aneurysm, distal flow appeared good & he didn't rec any changes; he requested that we do her f/u CTA & MRI Brain in [place] 9/14=> good flow thru the stented basilar art, no recurrent aneurysm, no recurrent acoustic neuroma;  Then she was [facility] 2/15 w/ TIA & MRI/MRA Brain showed acute infarct in right occipital lobe, otherw stable;  Subsequent f/u by [facility] Neurology- no change in meds, rec quit smoking and control of BP, BS, Chol...    Hyperlipid> on Prav40; tol well & FLP 11/15 shows TChol 168, TG 129, HDL 43, LDL 99; continue same + diet...    Hypothyroid>  on Levothy50 now; labs 11/15 shows TSH= 9.91 and she is rec to increase Synthroid back to 43mcg/d...    DJD/ FM> she is managing quite well w/ prn Tramadol & Tylenol; prev CWP resolved...    Osteopenia> she had min osteopenia on BMD several yrs ago & takes Calcium, MVI, Vit D 1000u daily; f/u BMD 2/15 showed TScores -0.6 in Spine, and -1.6 in right FemNeck; rec to stay on supplements & wt bearing exercise...    RLS> on Mirapex 0.25mg  Qhs as needed...     Acoustic Neuroma> this was resected by DrBrowne [facility] 2002; MRI 2/11 continued to look good- post op changes, no recurrent tumor seen; f/u MRI here 9/14 (& [facility] 2/15) continues to look good- no evid for recurrent or resid tumor...    Anxiety> on Tranxene as needed... We reviewed prob list, meds, xrays and labs> see  below for updates >> OK Flu shot today...  LABS 11/15:  FLP- at goals on Prav40;  Chems- wnl;  CBC- wnl;  TSH=9.91;  Sed=36... PLAN>>  She desperately need to quit smoking, declines smoking cessation help;  TSH is elev on Synthroid50 & she confirms daily dosing, therefore incr back to Synthroid75;  We will refer to [name] for 2nd opinion regarding her cerebrovasc dis, HAs, falling, weak spells...   ~  August 03, 2014:  66mo ROV & acute add-on visit requested by Neuro-DrJaffe due to CP & irreg heartbeat> Ana Santos was at [facility] office today (for f/u of headaches- improved w/ Neurontin but intol she said due to incr palpit) & mentioned left CP & palpit, he noted skips on exam & called for a work-in appt here;  She has a cardiac hx- followed for yrs by DrStuckey w/ cath 1995 showing min nonobstructive CAD & mild MVP; she developed palpit in 2015 w/ abn EKG (poor R prog, NSTTWA)) and 2DEcho showing norm LVF, no regional wall motion abn, Gr1DD, normal valves & RV; Holter monitor revealed NSR w/ some PACs & PVCs but no dangerous arrhythmias- she was asked to avoid caffeine, pseudophed, and QUIT SMOKING (she still smokes ~1/2ppd)... As  noted she has severe cerebrovasc dis & hx Acoustic Neuroma w/ prev gamma knife surg at [facility] in 2002; a 30d Event monitor at [facility] was said to reveal PACs, PVCs, but no AFib... Current symptoms sound similar to prev w/ skipping sensation, no racing, sl SOB, mild chest discomfort on & off daily x 3-4wks (sharp, not sore to touch, sl worse w/ movement); she does not exercise & her most strenuous activity is housework...    We reviewed prob list, meds, xrays and labs> see below for updates >>   CXR 2/16 showed norm heart size, linear atx vs scarring left base, otherw neg- NAD.Marland KitchenMarland Kitchen  EKG today showed NSR, rate80, occ PVCs noted, poor R prog V1=>3, NSSTTWA... PLAN>>  We decided to start METOPROLOL 25mg - 1/2 tab Bid for now & refer to Cards for f/u eval; reminded about smoking cessation but she declines additional counseling; her husb sees DrMcLean...  ~  Oct 20, 2014:  32mo ROV & Ana Santos reports improved after cardiac eval by DrArida (see below)- neg cath, neg 2DEcho, palpit improved w/ Metoprolol & decr caffeine etc; unfortunately she continues to smoke & will not quit, refuses smoking cessation meds etc... She is in the middle of bilat cat surg by DrBevis... No new complaints or concerns- denies cough, sput, hemoptysis, SOB, CP, f/c/s, etc; she states her main exercise is yard work... We reviewed prob list, meds, xrays and labs> see below for updates >>  PLAN>> she reports improved on current med regimen; we again discussed the need for smoking cessation, take meds regularly; she will f/u in 22mo w/ Fasting labs & call in the interim prn...  ~  April 22, 2015:  43mo ROV & Ana Santos has been having some increased HAs- went to see DrJaffe in Neuro w/ f/u MRI/MRA (no acute changes) and treated transiently w/ Pred taper & improved; she has Tramadol/ Tylenol to use as needed;  We reviewed the following medical problems during today's office visit >>     COPD> still smoking 5-6cig/d she says & can't vs won't quit; not on  meds, denies cough/ sputum/ hemoptysis/ SOB/ ch in DOE, etc; CXR 2/16= COPD, mild basilar atx, NAD; Spirometry shows mild obstructive dis- must quit all smoking!     Palpit> this  was a new complaint 1/15> w/u DrArida was neg (2DEcho, Cath) & symptoms improved w/ Metoprolol- asked to STOP SMOKING & avoid caffeine etc; Care Everywhere shows 30d Holter w/ PACs/ PVCs/ no AFib...    Cerebrovasc dis/ Aneurysm> on ASA325, Plavix75; she sees DrMorris IR at [facility] 8/12 w/ CTA that looked good- no resid aneurysm, distal flow appeared good & he didn't rec any changes; he requested that we do her f/u CTA & MRI Brain in [place] 9/14=> good flow thru the stented basilar art, no recurrent aneurysm, no recurrent acoustic neuroma;  Then she was [facility] at [facility] 2/15 w/ TIA & MRI/MRA Brain showed acute infarct in right occipital lobe, otherw stable;  Subsequent f/u by [facility] Neurology- no change in meds, rec quit smoking and control of BP, BS, Chol...    Hyperlipid> on Prav40; tol well & FLP 11/16 shows TChol 140, TG 112, HDL 43, LDL 75; continue same + diet...    Hypothyroid> on Levothy75 now; labs 11/15 shows TSH= 9.91 and she was rec to increase Synthroid back to 47mcg/d; Labs 11/16 showed TSH=0.22, continue same for now...    DJD/ FM> she is managing quite well w/ prn Tramadol & Tylenol; prev CWP resolved...    Osteopenia> she had min osteopenia on BMD several yrs ago & takes Calcium, MVI, Vit D 1000u daily; f/u BMD 2/15 showed TScores -0.6 in Spine, and -1.6 in right FemNeck; rec to stay on supplements & wt bearing exercise...    RLS> on Mirapex 0.25mg  Qhs as needed...     Acoustic Neuroma> resected by DrBrowne [facility] 2002; MRI 2/11 continued to look good- post op changes, no recurrent tumor seen; f/u MRI here 9/14 (& [facility] 2/15) continues to look good- no evid for recurrent or resid tumor.    Headaches> s/p eval by Neuro, DrJaffe- 9/16 note reviewed, MRI w/o acute changes, Cspine spondy noted w/ some spinal stenosis,  treated w/ Pred taper & improved...    Anxiety> on Tranxene as needed... We reviewed prob list, meds, xrays and labs> OK 2016 FLU vaccine today...  MRI/MRA 02/22/15>  No acute infarct, tiny cbll blood breakdown products unchanged w/o new areas of intracranial hemorrhage detected, remote right cbll infarcts & sm vessel dis, postop right vestib schwannoma surg- no acute changes, cerv spondylosis w/ sp stenosis C3-4 & C4-5, s/p coiling basilar tip aneurysm, mild irregularity of basilar art, mod narrowing of P1 segm left p[ost cerebral art (mild narrowing on right)...   Spirometry 04/22/15>  FVC=2.46 (97%), FEV1=1.69 (88%), %1sec=69%, mid-flows reduced at 64% predicted;  This is c/w mild airflow obstruction and GOLD Stage 1 COPD  LABS 04/22/15>  FLP- at goals on Prav40;  Chems- wnl;  CBC- wnl;  TSH= 0.22 on Synth75;  VitD= 48 on 1000u/d... IMP/PLAN>>  Ana Santos is stable overall w/ her mult serious medical issues;  We again discussed smoking cessation;  She continues to do alterations as she has done for 50+yrs;  We refilled her Tramadol & Tranxene...  ~  Oct 20, 2015:  35mo ROV & Ana Santos has been stable over the interval, c/o incr pain/ discomfort in neck & back (sched for ?neck injections next week, DrJaffe, DrBotero);  She is still smoking but now down to 2cig/d she says, breathing is OK & she denies cough/ sput/ hemoptysis/ SOB/ or CP;  She has been walking some & doing yard work- says she is NOT limited by her breathing & she has declined breathing meds in the past... We reviewed the  following medical problems during today's office visit >>     COPD> still smoking 1-2cig/d she says & can't vs won't quit; not on meds, denies cough/ sputum/ hemoptysis/ SOB/ ch in DOE, etc; CXR 2/16= COPD, mild basilar atx, NAD; Spirometry shows mild obstructive dis- must quit all smoking!     Palpit> this was a new complaint 1/15> w/u DrArida was neg (2DEcho, Cath) & symptoms improved w/ Metoprolol- asked to STOP SMOKING &  avoid caffeine etc; Care Everywhere shows 30d Holter w/ PACs/ PVCs/ no AFib...    Cerebrovasc dis/ Aneurysm> on ASA325, Plavix75; she sees DrMorris IR at [facility] 8/12 w/ CTA that looked good- no resid aneurysm, distal flow appeared good & he didn't rec any changes; he requested that we do her f/u CTA & MRI Brain in [place] 9/14=> good flow thru the stented basilar art, no recurrent aneurysm, no recurrent acoustic neuroma;  Then she was [facility] 2/15 w/ TIA & MRI/MRA Brain showed acute infarct in right occipital lobe, otherw stable;  Subsequent f/u by [facility] Neurology- no change in meds, rec quit smoking and control of BP, BS, Chol...    Hyperlipid> on Prav40; tol well & FLP 11/16 shows TChol 140, TG 112, HDL 43, LDL 75; continue same + diet...    Hypothyroid> on Levothy75 now; labs 11/15 shows TSH= 9.91 and she was rec to increase Synthroid back to 74mcg/d; Labs 11/16 showed TSH=0.22, continue same for now...    DJD/ FM> she is managing quite well w/ prn Tramadol & Tylenol; prev CWP resolved...    Osteopenia> she had min osteopenia on BMD several yrs ago & takes Calcium, MVI, Vit D 1000u daily; f/u BMD 2/15 showed TScores -0.6 in Spine, and -1.6 in right FemNeck; rec to stay on supplements & wt bearing exercise...    RLS> on Mirapex 0.25mg  Qhs as needed...     Acoustic Neuroma> resected by DrBrowne [facility] 2002; MRI 2/11 continued to look good- post op changes, no recurrent tumor seen; f/u MRI here 9/14 (& [facility] 2/15) continues to look good- no evid for recurrent or resid tumor.    Headaches> s/p eval by Neuro, DrJaffe- 9/16 note reviewed, MRI w/o acute changes, Cspine spondy noted w/ some spinal stenosis, treated w/ Pred taper & improved...    Anxiety> on Tranxene as needed... EXAM shows Afeb, VSS, O2sat=100% on RA;  HEENT- neg, mallampati1;  Chest- clear w/o w/r/r;  Heart- RR w/o m/r/g;  Abd- soft, nontender, neg;  Ext- VI w/o c/c/e;  Neuro- no focal neuro deficits...  LAB 10/20/15:  TSH=0.48 on  Synthroid 49mcg/d- continue same... IMP/PLAN>>  Mult medical issues as noted; Faustine is still smoking although she has cut down to 1-2cig/d she is reminded of the need to quit completely; REC to continue same meds regularly; she is sched for shots in her back next week... We plan ROV w/ CXR & fasting labs in 41mo.  ~  April 24, 2016:  36mo ROV & pulm/ medical follow up visit>  Ana Santos is still smoking 2-4 cig/d, denies cough, sput, SOB, but not doing any exercise (some yard work/ walking & no difficulties reported;  He only issue is occas bronchitic infection- usually Rx w/ Levaquin & resolves;  She denies CP, get rare palpit, no edema, etc;  She has known cerebrovasc dis/ aneurysm & followed by [facility] for this, her hx acoustic neuroma, and chr HAs- Tramadol helps but nothing else (he tried 5 diff meds w/o benefit).Marland KitchenMarland Kitchen  As noted- she is still smoking but denies breathing problems, no on meds but strongly urged to quit all smoking & offered smoking cessation counseling but declines...   She remains on Prav40 + diet for Chol and FLP today shows TChol 169, TG 158, HDL 46, LDL 91...    Hx hypothyroidism on Synthroid38mcg/d but TSH=0.20 & we decr dose today to 3mcg/d...    She saw Neuro, DrJaffe last 03/31/16> extensive note reviewed- hx basilar tip aneurysm- s/p coiling 2010, right vestib schwannoma- s/presection 2002, right cerebelar infact, recurrent TIAs, cervical spinal stenosis, tension-type HAs; she has seen DrBotero & had nerve root injections for LBP... EXAM shows Afeb, VSS, O2sat=99% on RA;  HEENT- neg, mallampati1;  Chest- clear w/o w/r/r;  Heart- RR w/o m/r/g;  Abd- soft, nontender, neg;  Ext- VI w/o c/c/e;  Neuro- no focal neuro deficits...  Last CXR was 12/17/15>  Norm heart size, Ao atherosclerosis, hyperinflated w/ flat diaph c/w COPD, scarring left base, NAD...   LABS 04/24/16>  FLP- Chol ok x TG=158;  Chems- ok w/ BS=81, Cr=0.83;  CBC- wnl;  TSH=0.20 on Synthroid75;  VitD=56...   IMP/PLAN>>  Beautifull has mult medica issues and continues to smoke- refusing all smoking cessation help;  Labs ok today x low TSH & we will dec her Synthroid from78mcg/d to 28mcg/d w/ f/u lab on return;  She is due PREVNAR-13 vaccine;  Her CC is still her chr daily HAs and we discussed trial Tranxene 7.5mg Bid to see if this helps (w/ freq & severity of the HAs) and Tramadol 50mg  prn- monitor use...  ~  Oct 23, 2016:  51mo ROV & Ana Santos reports that she is still smoking 4 cig/d despite all her cerebrovasc dis- again advised to quit all smoking in light of her prev cerebral infarction and severe vasc disease;  She has similarly declined inhaled meds & is proud that she made it thru the winter & spring so far w/o a bronchitic exac or upper resp infection...  We reviewed the following medical problems during today's office visit >>     COPD> still smoking 4cig/d she says & can't vs won't quit; not on meds, denies cough/ sputum/ hemoptysis/ SOB/ ch in DOE, etc; CXR 7/17= COPD, mild basilar atx, NAD; Spirometry 11/16 shows mild obstructive dis (FEV1=1.69, 88%)- must quit all smoking!     Palpit> this was a new complaint 1/15> w/u DrArida was neg (2DEcho, Cath) & symptoms improved w/ Metoprolol25Bid- asked to STOP SMOKING & avoid caffeine etc; Care Everywhere shows 30d Holter w/ PACs/ PVCs/ no AFib.    Cerebrovasc dis/ Aneurysm> on ASA325, Plavix75; she sees DrMorris IR at [facility] 8/12 w/ CTA that looked good- no resid aneurysm, distal flow appeared good & he didn't rec any changes; he requested that we do her f/u CTA & MRI Brain in [place] 9/14=> good flow thru the stented basilar art, no recurrent aneurysm, no recurrent acoustic neuroma;  Then she was [facility] 2/15 w/ TIA & MRI/MRA Brain showed acute infarct in right occipital lobe, otherw stable;  Subsequent f/u by [facility] Neurology- no change in meds, rec quit smoking and control of BP, BS, Chol...    Hyperlipid> on Prav40; tol well & FLP 11/17 shows TChol  169, TG 158, HDL 46, LDL 91; continue same + better diet...    Hypothyroid> on Levothy50 now;  Labs 11/16 on Levothy75 showed TSH=0.22, Labs 11/17 showed TSH=0.20 so we decr her to 24mcg/d;  Labs 10/23/16 shows TSH=6.28 & reminded  to take it Qam by itself & don't eat for 12min.    DJD/ FM> she is managing quite well w/ prn Tramadol & Tylenol; prev CWP resolved, now c/o intermittent left arm discomfort...    Osteopenia> she had min osteopenia on BMD several yrs ago & takes Calcium, MVI, Vit D 1000u daily; f/u BMD 2/15 showed TScores -0.6 in Spine, and -1.6 in right FemNeck; rec to stay on supplements & wt bearing exercise...    RLS> on Mirapex 0.25mg  Qhs as needed which continues to help she says...     Acoustic Neuroma> resected by DrBrowne [facility] 2002; MRI 2/11 continued to look good- post op changes, no recurrent tumor seen; f/u MRI here 9/14 (& [facility] 2/15) continues to look good- no evid for recurrent or resid tumor.    Headaches> s/p eval by Neuro, DrJaffe- 9/16 note reviewed, MRI w/o acute changes, Cspine spondy noted w/ some spinal stenosis, treated w/ Pred taper & improved; she tells me that she has stopped seeing Neuro because she was INTOL to all his meds uses to rx her HAs, she notes only the Tramadol+Tylenol regimen seems to help...    Anxiety> on Tranxene as needed... EXAM shows Afeb, VSS, O2sat=98% on RA;  HEENT- neg, mallampati1;  Chest- clear w/o w/r/r;  Heart- RR w/o m/r/g;  Abd- soft, nontender, neg;  Ext- VI w/o c/c/e;  Neuro- no focal neuro deficits...  LABS 10/23/16>  TSH= 6.28 & she is reminded to take it ist thing in the AM on empty stomach w/o other meds, & don't eat/ drink for 45 min... IMP/PLAN>>  Ana Santos is stable given her severe underlying dis & continued smoking habit;  She is asked to quit all smoking, take meds regularly as discussed & we gave her the PREVNAR-13 vaccination today;  We plan ROV recheck in 34mo w/ CXR, EKG, FASTING blood work...  ~  April 25, 2017:  73mo  ROV & Ana Santos reports a good interval- feeling well w/o new complaints or concerns; she notes that intermittent HAs persist but the Tramadol + Tylenol works fine & she reiterates that she was INTOL to ALL preventive meds tried by [facility] (Neuro) previously;  She is still smking ~4cig/d and can't vs won't quit;  She denies cough, sputum, hemoptysis, and states no change in her DOE/ SOB; she exercises w/ yard work pushing wheel barrow all day she says; also denies CP, palpit, dizzy, edema...  We reviewed the following medical problems during today's office visit>      COPD> still smoking 4cig/d she says & can't vs won't quit; not on meds, denies cough/ sputum/ hemoptysis/ SOB/ ch in DOE, etc; CXR today= COPD, sl incr markings, NAD; Spirometry 11/16 shows mild obstructive dis (FEV1=1.69, 88%)- must quit all smoking!     Palpit> this was a new complaint 1/15> w/u DrArida was neg (2DEcho, Cath) & symptoms improved w/ Metoprolol25Bid- asked to STOP SMOKING & avoid caffeine etc; Care Everywhere shows 30d Holter w/ PACs/ PVCs/ no AFib.    Cerebrovasc dis/ Aneurysm> on ASA325, Plavix75; she sees DrMorris IR at [facility] 8/12 w/ CTA that looked good- no resid aneurysm, distal flow appeared good & he didn't rec any changes; he requested that we do her f/u CTA & MRI Brain in [place] 9/14=> good flow thru the stented basilar art, no recurrent aneurysm, no recurrent acoustic neuroma;  Then she was [facility] at [facility] 2/15 w/ TIA & MRI/MRA Brain showed acute infarct in right occipital lobe, otherw stable;  Subsequent f/u by [facility]rology- no change in meds, rec quit smoking and control of BP, BS, Chol...    Hyperlipid> on Prav40; tol well & FLP 11/18 shows TChol 139, TG 107, HDL 49, LDL 69; continue same + diet...    Hypothyroid> on Levothy50;  Labs 10/23/16 shows TSH=6.28 & reminded to take it Qam by itself & don't eat for 35min; Labs today shows TSH=10.82 & we rec incr Levothy back to 64mcg/d...    DJD/ FM> she is managing quite  well w/ prn Tramadol & Tylenol; prev CWP resolved, now c/o intermittent left arm discomfort...    Osteopenia> she had min osteopenia on BMD several yrs ago & takes Calcium, MVI, Vit D 1000u daily; f/u BMD 2/15 showed TScores -0.6 in Spine, and -1.6 in right FemNeck; rec to stay on supplements & wt bearing exercise...    RLS> on Mirapex 0.25mg  Qhs as needed which continues to help she says...     Acoustic Neuroma> resected by DrBrowne [facility] 2002; MRI 2/11 continued to look good- post op changes, no recurrent tumor seen; f/u MRI here 9/14 (& [facility] 2/15) continues to look good- no evid for recurrent or resid tumor.    Headaches> s/p eval by Neuro, DrJaffe- 9/16 note reviewed, MRI w/o acute changes, Cspine spondy noted w/ some spinal stenosis, treated w/ Pred taper & improved; she tells me that she has stopped seeing Neuro because she was INTOL to all his meds uses to rx her HAs, she notes only the Tramadol+Tylenol regimen seems to help...    Anxiety> on Tranxene as needed... EXAM shows Afeb, VSS, O2sat=97% on RA;  HEENT- neg, mallampati1;  Chest- clear w/o w/r/r;  Heart- RR w/o m/r/g;  Abd- soft, nontender, neg;  Ext- VI w/o c/c/e;  Neuro- no focal neuro deficits...  CXR 04/25/17 (independently reviewed by me in the PACS system) showed norm heart size, aortic atherosclerosis, coarse interstitial markings bilat- NAD, mild multilevel DDD in Tspine...   LABS 04/25/17>  FLP- at goals on Prev40;  Chems- wnl w/ Cr0.82;  CBC- wnl w/ Hg=12.4;  TSH on Levothy50 =10.82 & we rec incr back to 76mcg/d IMP/PLAN>>  Ana Santos remains stable but continues to smoke ~4cig/d & she is again implored to quit!  Give 2018 FLU vaccine today;  Reminded to stay as active as poss w/ home exercises during the winter; we plan ROV recheck in 46mo, sooner if needed prn...   ~  Oct 23, 2017:  34mo ROV & Ana Santos presents w/ a 3wk hx of RLQ abd pain>  67 WF presents c/o "I'm falling apart" noting RLQ pain/ discomfort x 3wks, also c/o  left groin pain w/ walking & weight bearing and left occipital area pain on & off assoc w/ shooting left sided neck pain; she continues to smoke 4-5 cig/d, denies cough/ sputum/ SOB/ wheezing/ etc...  She has known severe cerebrovasc dis & hx acoustic neuroma- s/p surg at [facility];  Chart indicates min GI history- Hx TAH, ?if she still has appendix, hx mild IBS-c yrs ago & saw drPatterson w/ colonoscopy 11/06 that was neg/ wnl...  Along w/ the RLQ pain she notes appetite is fair but when she prepares the food she doesn't want to eat it; Wt today=112#, 5\' 2" Tall. BMI=20.5 (she has lost 8# over the past 65mo); notes sl nausea, no vomiting, normal BMs w/o D/C/ or blood seen; similarly she denies f/c/s => we discussed need for LABS and CT Abd&Pelvis ASAP...    NOTE:  She  has not had any interval visits w/ any of her medical specialists since her last ov 04/25/17... We reviewed the following medical problems during today's office visit>      COPD> still smoking 4cig/d she says & can't vs won't quit; not on meds, denies cough/ sputum/ hemoptysis/ SOB/ ch in [place], etc; CXR 11/18 showed COPD, sl incr markings, NAD; Spirometry 11/16 showed mild obstructive dis (FEV1=1.69, 88%)- must quit all smoking!     Palpit> this was a new complaint 1/15> w/u DrArida was neg (2DEcho, Cath) & symptoms improved w/ Metoprolol25Bid- asked to STOP SMOKING & avoid caffeine etc; Care Everywhere shows 30d Holter w/ PACs/ PVCs/ no AFib.    Cerebrovasc dis/ s/p basilar tip Aneurysm coiling> on ASA325, Plavix75; she sees DrMorris IR at [facility] 8/12 w/ CTA that looked good- no resid aneurysm, distal flow appeared good & he didn't rec any changes; he requested that we do her f/u CTA & MRI Brain in [place] 9/14=> good flow thru the stented basilar art, no recurrent aneurysm, no recurrent acoustic neuroma;  Then she was [facility] 2/15 w/ TIA & MRI/MRA Brain showed acute infarct in right occipital lobe, otherw stable;  Subsequent f/u by [facility]  Neurology- no change in meds, rec quit smoking and control of BP, BS, Chol...    Hyperlipid> on Prav40; tol well & FLP 11/18 shows TChol 139, TG 107, HDL 49, LDL 69; continue same + diet...    Hypothyroid> on Levothy75 now;  Labs 10/23/16 shows TSH=6.28 & reminded to take it Qam by itself & don't eat for 13min; Labs 11/18 showed TSH=10.82 & we incr Levothy back to 59mcg/d => f/u TSH today = 0.44, cont same.    NEW COMPLAINT 10/23/17- RLQ pain x 3wks => eval in progress    DJD/ FM> she is managing quite well w/ prn Tramadol & Tylenol; prev CWP resolved, prev left arm discomfort resolved on its own, now c/o intermittent left goin pain/ discomfort    Osteopenia> she had min osteopenia on BMD several yrs ago & takes Calcium, MVI, Vit D 1000u daily; f/u BMD 2/15 showed TScores -0.6 in Spine, and -1.6 in right FemNeck; rec to stay on supplements & wt bearing exercise...    RLS> on Mirapex 0.25mg  Qhs as needed which continues to help she says...     Acoustic Neuroma> resected by DrBrowne [facility] 2002; MRI 2/11 continued to look good- post op changes, no recurrent tumor seen; f/u MRI here 9/14 (& [facility] 2/15) continues to look good- no evid for recurrent or resid tumor.    Headaches> s/p eval by Neuro, DrJaffe- 9/16 note reviewed, MRI w/o acute changes, Cspine spondy noted w/ some spinal stenosis, treated w/ Pred taper & improved; she tells me that she has stopped seeing Neuro because she was INTOL to all his meds used to rx her HAs, she notes only the Tramadol+Tylenol regimen seems to help...    Anxiety> on Tranxene as needed... EXAM shows Afeb, VSS, O2sat=100% on RA;  HEENT- neg, mallampati1;  Chest- clear w/o w/r/r;  Heart- RR w/o m/r/g;  Abd- +BS, tender RLQ on palp w/ some guarding, no rebound, no mass felt;  Ext- VI w/o c/c/e;  Neuro- no focal neuro deficits...  CT Abd&Pelvis 10/23/17>  Centrilob emphysema seen in lung bases- NAD; normal GB, ducts, liver, pancreas, spleen; 1.6cm left adrenal nodule  (presumed adenoma); kidneys ok x subcentimeter hypodense renal cort lesions bilat; MILD WALL THICKENING IN TERMINAL ILEUM, APPENDIX NOT DISCRETELY VISUALIZED-  6MM CALCIF ADJACENT TO LAT MARGIN OF CECUM (?RETAINED APPENDICOLITH?), ECCENTRIC WALL THICKENING IN CECUM 7 ASC COLON W/ ASSOC PATCHY FAT STRANDING & ILL-DEFINED FLUID IN RLQ; atherosclerotic Abd Ao w/o aneurysm, no pathologically enlarged LNs; s/p Hyst, no adnexal masses.  Radiologist favors an infectious or inflamm enterocolitis  LABS 10/23/17>   Chems- wnl w/ K=3.9, Cr=0.82, BS=81, LFTs wnl;  CBC- Hg=13.5, WBC=12.2;  SED=68;  TSH=0.44 IMP/PLAN>>  RLQ pain w/ abn CT Abd as above- DDx betw ischemic colitis vs infectious or inflamm enterocolitis; I discussed situation w/ GI-DrArmbruster via phone today => rec treatment w/ CIPRO/ FLAGYL, liq diet & they will get her into the GI office ASAP to check pt & arrange for colonoscopy;  Pt understands that if pain increases or she develops vomiting, diarrhea, blood, etc that she is to go immediately to the ER for ADM & in-patient eval...          Problem List:   COPD (ICD-496) - she has a min smoker's cough, sm amt beige sputum... ~  CXR 5/08 w/ chr changes, LLL scar, NAD. ~  CXR 7/09 showed norm hrt size, clear lungs, NAD. ~  CXR 9/10 showed NAD. ~  CXR 10/11 showed chr changes, NAD. ~  CXR 12/12 showed COPD, NAD. ~  CXR 4/13 showed normal heart size, incr interstitial markings, NAD. ~  CXR 1/15 showed norm heart size, COPD/Emphysema w/ scarring in lingula, NAD. ~  CXR 2/16 showed norm heart size, linear atx vs scarring left base, otherw neg- NAD ~  Spirometry 04/22/15>  FVC=2.46 (97%), FEV1=1.69 (88%), %1sec=69%, mid-flows reduced at 64% predicted;  This is c/w mild airflow obstruction and GOLD Stage 1 COPD  CIGARETTE SMOKER (ICD-305.1) - can't vs won't quit and not interested in smoking cessation help... now she states she's decr to ~1/4-1/2 ppd but can't seem to improve from there... ~  She  understands the critical need to quit smoking from the COPD/Pulm, Cardiac, & Vascular/Stroke standpoints;  She declines offers for smoking cessation help, chantix, nicotine replacement, etc... ~  2/15:  She was hosp at [facility] w/ Diplopia & found to have a right occipital stroke=> she has decr smoking to <1cig/d & encouraged to quit completely!!! ~  5/15 to 11/15:  She is still smoking 3-4cig/d she says & I have begged her to quit, discussed nicotine replacement rx & alternatives... ~  2/16: she is still smoking <1/2ppd she says but again declines smoking cessation help... ~  5/17: she has decr to 1-2 cig/d she says, reminded of the need to quit completely!  ?MITRAL VALVE PROLAPSE (ICD-424.0) >> mild MVP seen on cath 1995 by DrStuckey... Hx of CHEST PAIN (ICD-786.50) >> she had CP in the past and a neg cath 1995 (min coronary irregularities)...  Hx PALPITATIONS >> PVCs and PACs seen on EKG & Holter monitor in 2015; asked to stop all caffeine & nicotine... ~  CP eval 1995 w/ cath showing min 10% luminal irreg RCA & mild MVP, norm LVF... ~  [place] was neg- no ischemia, no infarct, EF=66%... ~  baseline EKG showed NSR, NSSTTWA (poor R prog V1-V3)... ~  EKG 1/15 showed NSR, rate60, poor R prog V1-3, NSSTTWA, NAD... ~  2DEcho 2/15 showed normal LV size & function w/ EF=55-60%, no regional wall motion abnormalities, Gr1DD, norm valves and RV ~  Holter Monitor 2/15 showed NSR, PACs, PVCs, and no pathologic arrhythmias (the skips correspond to her subjective SOB/palpit)...  ~  She had another 30d Holter  per [facility] Neurology w/ Care Everywhere report indicating PACs & PVCs, no AFib... ~  EKG 2/16 showed NSR, rate80, PVCs noted, poor R prog V1=>3, NSSTTWA... She noted incr palpit & CP => referred to Cards for their review... ~  Seen by DrArida 2/16> Cath showed minor luminal irregularities w/o obstructive CAD, norm LVF w/ EF=55%, no signif MR; symptoms felt to be due to PVCs & Rx w/ Metoprolol  improved her symptoms...  ~  2DEcho 3/16 showed norm LV size & function w/ EF=65-70%, no regional wall motion abn, Gr1DD, norm valves, norm RV & PAsys...  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 referred to [facility]: [facility] Rhonda W w/ Angiogram confirmation & subseq stent assisted coiling of the aneurysm 11/10... subseq f/u angiogram 2/11 showed complete angiographic obliteration of the aneurysm, & patent stent in distal basilar art (there was a new 50%stenosis in the right PCA)... on ASA 325mg /d & PLAVIX restarted 2/11 w/ new gait abn & cbll infarct on MRI. ~  she saw DrMorris [facility]- interventional neuroradiology- w/ another arteriogram performed 8/11- it showed satis appearance of the basilar aneurysm stent & coiling w/ some prolapse of the coils into the P1 segm w/ mild compromise of the post cerebral art (flow looks adeq & he rec continued ASA/ Plavix therapy & f/u 61yr). ~  She had f/u DrMorris 8/12 w/ CTA that looked good- no resid aneurysm, distal flow appeared good & he didn't rec any changes- f/u planned 55yrs. ~  7/14: she is due for her f/u CTA & we will call [facility] regarding a follow up appt w/ DrMorris.. ~  9/14:  on SAY301, Plavix75; last f/u DrMorris IR at [facility] 8/12 w/ CTA that looked good- no resid aneurysm, distal flow appeared good & he didn't rec any changes; he requested that we do her f/u CTA & MRI Brain here in [place] 9/14=> good flow thru the stented basilar art, no recurrent aneurysm, no recurrent acoustic neuroma...  ~  CTA Head 9/14 showed s/p stent assisted coiling of basilar tip aneurysm, no aneurysm recurrence, patent basilar art & left post cerebral art, mild atherosclerotic calcif in cavernous carotids w/o stenosis, post op right mastoid changes w/o recurrent tumor. ~  2/15: Adm to [facility] w/ Dip[lopia & eval revealed right occipital lobe infarct, everything else was stable, continued on her ASA325/ Plavix75=>  we will refer to [facility] Neurology for their review of her situation... ~  3/15: she had a busy March> f/u w/ Neuro in [place] then had another TIA & presented to the ER; CTA Head & Neck showed similar to prev, no recurrent aneurysm, & they disch her on same ASA325 + Plavix75... ~  9/15: she had f/u DrHusseini at [facility], note reviewed on Care Everywhere- felt to be stable & no changes made> rec to quit smoking & control BP, BS, Chol; they released her to f/u prn... ~  11/15: pt upset that Neuro didn't address her c/o temporal HAs, falling, weak spells- we will refer to [place] [facility] per her request...  ~  2/16: pt seen by DrJaffe> Hx basilar tip aneurysm s/p coiling 2010, right cbll infarct, right vestib schwannoma s/p surg 2002, recurrent TIA, & HAs; they reied Gabapentin=> HA improved but she developed CP & thought it was from this med therefore stopped it; she was advised to try MelatoninQhs & limit her Tramadol rx, f/u 50mo...  VENOUS INSUFFICIENCY (ICD-459.81) - she tells me that she saw  DrKrush for vein surgery and treatment ("it's covered by my husb's insurance").  HYPERLIPIDEMIA (ICD-272.4) - now on PRAVASTATIN 40mg /d & tol well... we reviewed low chol/ low fat diet. ~  chart review shows TChol ~ 200 range in the 80's and early 90's. ~  then TChol incr to ~250 range in late 90's and Lipitor started-  ~  FLP 5/08 on Lipitor 10mg /d showed TChol 164, TG 153, HDL 45, LDL 89. ~  in 2009 c/o no energy & arm discomfort she felt was from the Lip10- therefore [place]. ~  [place] 7/09 on diet alone showed TChol 234, TG 218, HDL 40, LDL 139... rec- start Prav40. ~  FLP 3/10 on Prav40 showed TChol 182, Tg 137, HDL 47, LDL 107 ~  FLP 6/11 on Prav40 showed TChol 162, TG 230, HDL 37, LDL 88 ~  FLP 6/12 on Prav40 showed TChol 177, TG 139, HDL 44, LDL 105... Contin diet, take med every day. ~  FLP 6/13 on Prav40 showed TChol 151, TG 138, HDL 42, LDL 81  ~  FLP 7/14 on Prav40 showed TChol 174, TG 68, HDL 58, LDL 103 ~  FLP  5/15 on Prav40 showed TChol 164, TG 120, HDL 49, LDL 91 ~  FLP 11/15 on Prav40 showed TChol 168, TG 129, HDL 43, LDL 99  ~  FLP 11/16 on Prav40 showed TChol 140, TG 112, HDL 43, LDL 75  HYPOTHYROIDISM (ICD-244.9) - stable on LEVOTHYROID 20mcg/d now... hx Hashimoto's thyroiditis in 1989 w/ markedly pos antimicrosomal antibodies and transient hyperthy labs...  ~  labs 5/08 showed TSH= 0.53... rec- continue Levo100/d. ~  labs 7/09 showed TSH= 0.07... rec- decr Levothy100 to 1/2 daily. ~  labs 3/10 showed TSH= 17.71... rec> incr back to 173mcg/d. ~  labs 9/10 showed TSH= 0.21... continue same. ~  labs 6/11 showed TSH= 0.10... rec decr Levo100 to 1/2 daily. ~  labs 10/11 on Levo50 showed TSH= 1.25 (FreeT3 & FreeT4 normal) ~  Labs 6/12 on Levo50 showed TSH= 12.56 ==> we will contact pt & pharm to determine if taking Levo100-1/2tab daily every day? & adjust. ~  Labs 12/12 on Levo75 showed TSH= 0.88... Continue same. ~  Labs 6/13 on Levo75 showed TSH= 0.71... Continue same. ~  Labs 7/14 on Levo75 showed TSH= 2.47 ~  Labs 5/15 on Levo75 showed TSH= 0.25 and we rec decr to Levothy50... ~  Labs 11/15 on Levo50 showed TSH= 9.91 and she is rec to go back on Synthroid75 ~  Labs 11/16 on Levo75 showed TSH=0.22 and rec to continue same for now... ~  Labs 5/17 on Levo75 showed TSH= 0.48  IRRITABLE BOWEL SYNDROME (ICD-564.1) - had colonoscopy by [facility] 11/06 that was WNL.Marland Kitchen. she notes some constipation & Rx w/ colase/ senakot-S OTC...  NEW PROBLEM 10/23/17 >> RLQ pain of 3wk duration => w/u in progress  NEPHROLITHIASIS (ICD-592.0) - last stone passed in 1993, no known recurrence since then...  Hx of TENDINITIS, RIGHT ELBOW (ICD-727.09) Hx right shoulder pain w/ eval by DrNorris (MRI was planned butnever done & symptoms improved spont)... FIBROMYALGIA (ICD-729.1) - prev severe symptoms may have reflected a flair in her FM> but much improved on Pred Rx. ~  labs 9/10 showed Sed= 24 ~  labs 6/11 showed  Sed= 30 ~  labs 10/11 showed CBC=norm, Chems=norm, TFTs=norm, Sed=21, RF=neg, ANA+1:40 speckled... ~  12/11:  pt saw DrTruslow & his note is pending- she indicates he stopped her Pred, & gave her shot in shoulder.  OSTEOPENIA (ICD-733.90) -  BMD here 7/09 showed TScores -0.8 in Spine, and -1.2 in right FemNeck... rec to take Calcium, MVI, Vit D... ~  BMD repeated 2/15 & showed TScores -0.6 in Spine, and -1.6 in right FemNeck; rec to stay on Calcium, MVI, VitD, & wt bearing exercise...  VITAMIN D DEFICIENCY (ICD-268.9) ~  labs 7/09 showed Vit D level = 16... rec- start Vit D 50K weekly. ~  labs 3/10 showed Vit D level = 69... rec> change to 1000 u daily. ~  labs 6/11 showed Vit D level = 55... Continue same. ~  Labs 6/13 showed Vit D level = 53... Continue same. 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 no known recurrence... ~  MRI Brain 2/07 showed s/p translabyrinthine approach to right acoustic neuroma w/o recurrence, NAD... ~  MRI Brain 10/10 showed no recurrence of tumor, but 4-22mm basilar tip aneurysm detected & Rx as above. ~  MRI Brain 2/11 showed no recurrence/ post op changes, endovasc oblit of basilar aneurysm, cerebellar lacunar infarct & 50% stenosis of right PCA... PLAVIX restarted... she had review by [facility] & Neurology (as above)... ~  We discussed f/u MRI since DrBrowne hasn't seen her since 2011 (?he turned her over to DrMorris)... ~  9/14:  MRI Brain showed no evid of resid or recurrent vestib schwannoma on the right, post surg changes w/ scarring in the int auditory canal, ols sm vessel cerebellar infarctions are stable, mild atrophy & sm vessel dis, prev coiled basilar tip aneurysm, no acute insults...  ~  MRI/ MRA Brain 2/15 at [facility] showed acute infarct in right occipital lobe, chronic infarcts in cerebellum bilat, aneurysm coiling of basilar art tip and stent in basilar art.. ~  CT  Angio Head & Neck 3/15 in [place] showed 40% narrowing of prox left subclavian art; 40% RICAstenosis & 95% LICAstenosis; aneurysm coiling & stenting of basilar tip aneurysm...  RLS symptoms >> she complained 1/14 about new onset RLS symptoms- discussed trial Mirapex 0.25mg  prn...  ANXIETY (ICD-300.00) - uses CHLORAZEPATE 7.5mg Tid... she gets claustrophobic and needs sedation before MRIs.   Past Surgical History:  Procedure Laterality Date  . ANEURYSM COILING  11-10   Dr. Estanislado Pandy  . BREAST LUMPECTOMY    . LEFT HEART CATHETERIZATION WITH CORONARY ANGIOGRAM N/A 08/05/2014   Procedure: LEFT HEART CATHETERIZATION WITH CORONARY ANGIOGRAM;  Surgeon: Wellington Hampshire, MD;  Location: [facility];  Service: Cardiovascular;  Laterality: N/A;  . TOTAL ABDOMINAL HYSTERECTOMY    . TRANSLABYRINTHINE PROCEDURE  2002   [facility] Dr. Vicie Mutters    Outpatient Encounter Medications as of 10/23/2017  Medication Sig  . acetaminophen (TYLENOL) 500 MG tablet Take 500 mg by mouth every 6 (six) hours as needed for mild pain.   Marland Kitchen aspirin 325 MG tablet Take 325 mg by mouth daily.    . calcium gluconate 500 MG tablet Take 1 tablet by mouth daily.  . cholecalciferol (VITAMIN D) 1000 UNITS tablet Take 1,000 Units by mouth daily.    . clopidogrel (PLAVIX) 75 MG tablet TAKE 1 TABLET DAILY  . clorazepate (TRANXENE) 7.5 MG tablet Take 1 tablet (7.5 mg total) by mouth 3 (three) times daily as needed for anxiety.  . hydroxypropyl methylcellulose / hypromellose (ISOPTO TEARS / GONIOVISC) 2.5 % ophthalmic solution Place 1 drop into both eyes as needed for dry eyes.  Marland Kitchen levothyroxine (SYNTHROID) 75 MCG tablet Take 1 tablet (  75 mcg total) daily before breakfast by mouth.  . metoprolol tartrate (LOPRESSOR) 25 MG tablet TAKE 1 TABLET TWICE A DAY  . pramipexole (MIRAPEX) 0.25 MG tablet TAKE 1 TABLET AT BEDTIME FOR RESTLESS LEG SYMPTOMS  . pravastatin (PRAVACHOL) 40 MG tablet TAKE 1 TABLET DAILY  . traMADol (ULTRAM) 50 MG tablet Take 1  tablet (50 mg total) by mouth 3 (three) times daily as needed for moderate pain.  . [DISCONTINUED] levothyroxine (SYNTHROID, LEVOTHROID) 50 MCG tablet TAKE 1 TABLET DAILY BEFORE BREAKFAST   No facility-administered encounter medications on file as of 10/23/2017.     Allergies  Allergen Reactions  . Atorvastatin Other (See Comments)     Lipitor caused arm pain (3/09)  . Hydrocodone-Acetaminophen Other (See Comments)    hallucinations  . Morphine Other (See Comments)    hallucinations  . Nortriptyline     Caused nausea and vomiting and shaking, kept her up all night  . Topamax [Topiramate] Nausea And Vomiting    *dizziness*    Immunization History  Administered Date(s) Administered  . Influenza Split 06/09/2011, 03/29/2012  . Influenza Whole 03/12/2009, 03/29/2010  . Influenza, High Dose Seasonal PF 03/17/2016, 04/25/2017  . Influenza,inj,Quad PF,6+ Mos 05/22/2013, 04/17/2014, 04/22/2015  . Pneumococcal Conjugate-13 10/23/2016  . Pneumococcal Polysaccharide-23 12/18/2007    Current Medications, Allergies, Past Medical History, Past Surgical History, Family History, and Social History were reviewed in [employer] record.   Review of Systems         See HPI - all other systems neg except as noted... The patient complains of dyspnea on exertion, recent palpitations & HAs.  The patient denies anorexia, fever, weight loss, weight gain, vision loss, decreased hearing, hoarseness, chest pain, syncope, peripheral edema, prolonged cough, hemoptysis, abdominal pain, melena, hematochezia, severe indigestion/heartburn, hematuria, incontinence, muscle weakness, suspicious skin lesions, transient blindness, difficulty walking, depression, unusual weight change, abnormal bleeding, enlarged lymph nodes, and angioedema.     Objective:   Physical Exam     WD, WN, 77 y/o WF in NAD... GENERAL:  Alert & oriented; pleasant & cooperative... HEENT:  [place]/AT, EOM-wnl, PERRLA,  EACs-clear, TMs- some scarring on right, NOSE-clear, THROAT-clear & wnl. NECK:  Supple w/ fairROM; no JVD; normal carotid impulses w/o bruits; palp thyroid, no nodules felt; no lymphadenopathy. CHEST:  Clear to P & A; without wheezes/ rales/ or rhonchi heard... HEART:  Regular Rhythm; gr1/6 SEM,  no irregularity, rubs or gallops heard... ABDOMEN:  Soft & nontender; normal bowel sounds; no organomegaly or masses detected. EXT: without deformities, mild arthritic changes; no varicose veins/ venous insuffic/ or edema... +trigger points about the neck/ shoulders w/ decr ROM right shoulder w/ pain... NEURO:  CN's intact; no focal neuro deficits... gait & station are normal. DERM:  No lesions noted; no rash etc...  RADIOLOGY DATA:  Reviewed in the EPIC EMR & discussed w/ the patient...  LABORATORY DATA:  Reviewed in the EPIC EMR & discussed w/ the patient...   Assessment & Plan:    10/23/16>   Ana Santos is stable given her severe underlying dis & continued smoking habit;  She is asked to quit all smoking, take meds regularly as discussed & we gave her the PREVNAR-13 vaccination today;  We plan ROV recheck in 79mo w/ CXR, EKG, FASTING blood work 04/25/17>   Ana Santos remains stable but continues to smoke ~4cig/d & she is again implored to quit!  Give 2018 FLU vaccine today;  Reminded to stay as active as poss w/ home exercises during  the winter; we plan ROV recheck in 18mo, sooner if needed prn... 10/23/17>   RLQ pain w/ abn CT Abd as above- DDx betw ischemic colitis vs infectious or inflamm enterocolitis; I discussed situation w/ GI-DrArmbruster via phone today => rec treatment w/ CIPRO/ FLAGYL, liq diet & they will get her into the GI office ASAP to check pt & arrange for colonoscopy;  Pt understands that if pain increases or she develops vomiting, diarrhea, blood, etc that she is to go immediately to the ER for ADM & in-patient eval.   COPD/ Smoker>  She notes min cough/ sputum/ etc; desperately needs to  quit smokng- offered counseling, medications, etc but she declines...  Hx mild MVP>  No MVP seen on 2DEcho 2/15, mild MVP was noted on cath 1995 by DrStuckey...  PALPITATIONS>> Prev eval w/ CXR, EKG, 2DEcho & Holter monitor=> all studies OK/NEG as above (x PACs/ PVCs); we stressed the importance of no nicotine, no caffeine, etc... 2/16> she presented w/ incr symptoms over the last month; rec to start Metop25- 1/2Bid & refer to Cards for their review... 2/16> seen by DrArida w/ Cath, repeat 2DEcho- both neg & palpit/ PVCs improved w/ Metoprolol rx...  Hx right sided Acoustic Neuroma- s/p surg 2002 at [facility]> no known recurrence to date including MRI 9/14...  CEREBROVASC Dis w/ basilar tip aneurysm stent & coiling 11/10 by DrDeveshwar; f/u arteriogram 2/11 at [facility] & 8/11 at [facility] showed some compromise of the post circ w/ prolapse of the coils into the P1 segm;  Note: MRI 2/11 done for new gait abn showed cbll infarct & ASA/ Plavix restarted and she did home phys therapy w/ improvement;  Repeat IR eval 8/12 w/ CTA showed oblit of the aneurysm & distal flow appears good as well- they rec repeat studies in 2 yrs=> Done 9/14 in [place] & no aneurysm seen, stent ok w/ good flow thru the area, felt to be stable... 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 Another TIA 3/15>  eval via ER w/ CTA Head & Neck- stable, no changes made to meds- rec to continue ASA/ Plavix, AND QUIT SMOKING.... She was released by [facility]- DrHusseini & asked to quit smoking, continue ASA/Plavix, keep BP/ BS/ Chol under control... 11/15> pt c/o HAs, falls, weakness & requests Neuro 2nd opinion at [facility]... 2/16> seen by [facility] for HAs & neuro 2nd opinion...  HYPERLIPID>  On Prav40 + diet;  FLP looks OK but needs better diet & take med Qhs...  HYPOTHYROID>  Back on Levothy 25mcg/d now w/ TSH= 10.82 on the 42mcg/d  dose...  IBS w/ Constip>  She uses OTC laxatives as needed & we discussed Miralax/ Senakot-S/ etc...  ORTHO>  Prev right shoulder discomfort improved spont & MRI was never done; currently uses Tramadol Prn... ~  2017> c/o neck & back pain; DrJaffe sent her to [facility] & he has sched back injections...  OSTEOPENIA/ Vit D defic>  Stable on Calcium, MVI, Vit D supplement; due for f/u BMD=> pending...  RLS symptoms>  trial Mirapex 0.25mg  hs prn...  Hx Chronic daily HAs>>  HAs improved spontaneously- on Tramadol & Tylenol w/ some relief of pain; she tried Topamax but intol w/ dizzy & nausea... 11/15> presented w/ incr HAs and referred to Neuro; they tried Neurontin w/ decr HAs but intol she says...  ANXIETY>  Uses Tranxene Prn (she has some claustrophobia & needs  sedation before MRIs- she doesn't remember the 2/11 MRI being done!).Marland KitchenMarland Kitchen   Patient's Medications  New Prescriptions                          CIPRO 750mg  #20 -- one tab by mouth twice daily til gone                      FLAGYL 500mg  #40 -- one tab by mouth four times daily til gone  Previous Medications   ACETAMINOPHEN (TYLENOL) 500 MG TABLET    Take 500 mg by mouth every 6 (six) hours as needed for mild pain.    ASPIRIN 325 MG TABLET    Take 325 mg by mouth daily.     CALCIUM GLUCONATE 500 MG TABLET    Take 1 tablet by mouth daily.   CHOLECALCIFEROL (VITAMIN D) 1000 UNITS TABLET    Take 1,000 Units by mouth daily.     CLOPIDOGREL (PLAVIX) 75 MG TABLET    TAKE 1 TABLET DAILY   CLORAZEPATE (TRANXENE) 7.5 MG TABLET    Take 1 tablet (7.5 mg total) by mouth 3 (three) times daily as needed for anxiety.   HYDROXYPROPYL METHYLCELLULOSE / HYPROMELLOSE (ISOPTO TEARS / GONIOVISC) 2.5 % OPHTHALMIC SOLUTION    Place 1 drop into both eyes as needed for dry eyes.   LEVOTHYROXINE (SYNTHROID) 75 MCG TABLET    Take 1 tablet (75 mcg total) daily before breakfast by mouth.   METOPROLOL TARTRATE (LOPRESSOR) 25 MG TABLET    TAKE 1 TABLET TWICE A DAY    PRAMIPEXOLE (MIRAPEX) 0.25 MG TABLET    TAKE 1 TABLET AT BEDTIME FOR RESTLESS LEG SYMPTOMS   PRAVASTATIN (PRAVACHOL) 40 MG TABLET    TAKE 1 TABLET DAILY   TRAMADOL (ULTRAM) 50 MG TABLET    Take 1 tablet (50 mg total) by mouth 3 (three) times daily as needed for moderate pain.  Modified Medications   No medications on file  Discontinued Medications   LEVOTHYROXINE (SYNTHROID, LEVOTHROID) 50 MCG TABLET    TAKE 1 TABLET DAILY BEFORE BREAKFAST

## 2017-10-24 ENCOUNTER — Ambulatory Visit (INDEPENDENT_AMBULATORY_CARE_PROVIDER_SITE_OTHER): Payer: Medicare Other | Admitting: Nurse Practitioner

## 2017-10-24 ENCOUNTER — Encounter: Payer: Self-pay | Admitting: Nurse Practitioner

## 2017-10-24 VITALS — BP 102/52 | HR 52 | Ht 62.0 in | Wt 114.0 lb

## 2017-10-24 DIAGNOSIS — R933 Abnormal findings on diagnostic imaging of other parts of digestive tract: Secondary | ICD-10-CM

## 2017-10-24 DIAGNOSIS — R1031 Right lower quadrant pain: Secondary | ICD-10-CM

## 2017-10-24 NOTE — Progress Notes (Signed)
Chief Complaint:  Abdominal pain   Referring Provider:     Teressa Lower, MD   ASSESSMENT AND PLAN;   1. 77 yo female with 3 week hx RLQ tenderness and intermittent diffuse lower abdominal pain. CT scan suggests wall thickening in the terminal ileum, eccentric wall thickening in cecum and ascending colon. There is associated patchy fat stranding and ill-defined fluid throughout the RLQ. Would think diverticulitis on list of DDx. Other possibilities include ischemia (unusual area though), neoplasm, infectious (seems less likely given duration of sx and also absence of diarrhea.  -Agree with antibiotics.  I will see her back early next week.  In the interim she will go to the emergency department for worsening pain and/or fevers.  -If patient gets better she will probably need reimaging in a few weeks -I think she will probably get a colonoscopy in the near future as well -If gets acutely worse then surgical evaluation and reimaging  2. Tobacco abuse. Advised to stop smoking.   HPI:     Patient is a 77 yo female with COPD / ongoing smoking , CVA on plavix. She is a former patient of Dr. Sharlett Iles, here with intermittent lower abdominal pain for about 3 weeks. She complains of significant tenderness in RLQ making it hard to wear pants. She gets intermittent pain across lower abdomen. Pain not related to eating, it is not positional nor related to defecation. In addition to the pain she gets nauseated when trying to eat. No fevers. Has no appetite, and has lost 8 pounds in 6 months. No bowel changes, no blood in stool. No urinary sx. CT scan shows some thickening in distal small bowel  wBC 12.2. PCP started her on cipro and flagyl and she started it this am.    Last colonoscopy was in 2006  Past Medical History:  Diagnosis Date  . AN (acoustic neuroma) ([place])   . Aneurysm ([place])   . Anxiety   . Cerebrovascular disease   . Cigarette smoker   . COPD (chronic obstructive pulmonary disease)  ([place])   . Coronary artery disease   . Fibromyalgia   . Hyperlipidemia   . Hypothyroidism   . IBS (irritable bowel syndrome)   . Insomnia   . Mitral valve prolapse   . Nephrolithiasis   . Osteopenia   . Palpitations   . Tendinitis of right elbow   . Venous insufficiency   . Vitamin D deficiency      Past Surgical History:  Procedure Laterality Date  . ANEURYSM COILING  11-10   Dr. Estanislado Pandy  . BREAST LUMPECTOMY    . LEFT HEART CATHETERIZATION WITH CORONARY ANGIOGRAM N/A 08/05/2014   Procedure: LEFT HEART CATHETERIZATION WITH CORONARY ANGIOGRAM;  Surgeon: Wellington Hampshire, MD;  Location: [facility];  Service: Cardiovascular;  Laterality: N/A;  . TOTAL ABDOMINAL HYSTERECTOMY    . TRANSLABYRINTHINE PROCEDURE  2002   [facility] Dr. Vicie Mutters   Family History  Problem Relation Age of Onset  . Heart attack Mother   . Diabetes Mother   . Cancer Father   . Diabetes Sister   . Diabetes Brother   . Cancer Sister        unkown type  . Lung cancer Brother        Lung  . AAA (abdominal aortic aneurysm) Neg Hx   . Colon cancer Neg Hx   . Stomach cancer Neg Hx    Social History   Tobacco Use  . Smoking status:  Current Every Day Smoker    Packs/day: 0.25    Years: 50.00    Pack years: 12.50    Types: Cigarettes  . Smokeless tobacco: Never Used  . Tobacco comment: 4-5 cigs daily  is aware she needs to quit  Substance Use Topics  . Alcohol use: No    Alcohol/week: 0.0 oz  . Drug use: No   Current Outpatient Medications  Medication Sig Dispense Refill  . acetaminophen (TYLENOL) 500 MG tablet Take 500 mg by mouth every 6 (six) hours as needed for mild pain.     Marland Kitchen aspirin 325 MG tablet Take 325 mg by mouth daily.      . calcium gluconate 500 MG tablet Take 1 tablet by mouth daily.    . cholecalciferol (VITAMIN D) 1000 UNITS tablet Take 1,000 Units by mouth daily.      . clopidogrel (PLAVIX) 75 MG tablet TAKE 1 TABLET DAILY 90 tablet 1  . clorazepate (TRANXENE) 7.5 MG tablet Take 1  tablet (7.5 mg total) by mouth 3 (three) times daily as needed for anxiety. 90 tablet 5  . hydroxypropyl methylcellulose / hypromellose (ISOPTO TEARS / GONIOVISC) 2.5 % ophthalmic solution Place 1 drop into both eyes as needed for dry eyes.    Marland Kitchen levothyroxine (SYNTHROID) 75 MCG tablet Take 1 tablet (75 mcg total) daily before breakfast by mouth. 90 tablet 2  . metoprolol tartrate (LOPRESSOR) 25 MG tablet TAKE 1 TABLET TWICE A DAY 180 tablet 2  . pramipexole (MIRAPEX) 0.25 MG tablet TAKE 1 TABLET AT BEDTIME FOR RESTLESS LEG SYMPTOMS 90 tablet 1  . pravastatin (PRAVACHOL) 40 MG tablet TAKE 1 TABLET DAILY 90 tablet 2  . traMADol (ULTRAM) 50 MG tablet Take 1 tablet (50 mg total) by mouth 3 (three) times daily as needed for moderate pain. 90 tablet 1   No current facility-administered medications for this visit.    Allergies  Allergen Reactions  . Atorvastatin Other (See Comments)     Lipitor caused arm pain (3/09)  . Hydrocodone-Acetaminophen Other (See Comments)    hallucinations  . Morphine Other (See Comments)    hallucinations  . Nortriptyline     Caused nausea and vomiting and shaking, kept her up all night  . Topamax [Topiramate] Nausea And Vomiting    *dizziness*     Review of Systems: Positive for back pain, headaches and urine leakage. All other systems reviewed and negative except where noted in HPI.    Physical Exam:    Wt Readings from Last 3 Encounters:  10/24/17 114 lb (51.7 kg)  10/23/17 112 lb (50.8 kg)  04/25/17 120 lb 3.2 oz (54.5 kg)    BP (!) 102/52   Pulse (!) 52   Ht 5\' 2"  (1.575 m)   Wt 114 lb (51.7 kg)   BMI 20.85 kg/m  Constitutional:  Thin white female in no acute distress. Psychiatric: Normal mood and affect. Behavior is normal. EENT: Pupils normal.  Conjunctivae are normal. No scleral icterus. Neck supple.  Cardiovascular: Normal rate, regular rhythm. No edema Pulmonary/chest: Effort normal and breath sounds normal. No wheezing, rales or  rhonchi. Abdominal: Soft, nondistended. Mild RLQ fullness and moderate tenderness.  Bowel sounds active throughout. No hepatomegaly. Neurological: Alert and oriented to person place and time. Skin: Skin is warm and dry. No rashes noted.  Tye Savoy, NP  10/24/2017, 3:26 PM  Cc: Noralee Space, MD

## 2017-10-24 NOTE — Patient Instructions (Signed)
If you are age 77 or older, your body mass index should be between 23-30. Your Body mass index is 20.85 kg/m. If this is out of the aforementioned range listed, please consider follow up with your Primary Care Provider.  If you are age 72 or younger, your body mass index should be between 19-25. Your Body mass index is 20.85 kg/m. If this is out of the aformentioned range listed, please consider follow up with your Primary Care Provider.   Continue antibiotics.  Clear liquid diet. IF pain improves over the next few days, then soft diet.  If fever or worsening of pain, go the the emergency dept.  Follow up appointment with me on Oct 31, 2017 at 3 pm.  Thank you for choosing me and [facility].   Tye Savoy, NP

## 2017-10-26 ENCOUNTER — Encounter: Payer: Self-pay | Admitting: Nurse Practitioner

## 2017-10-29 NOTE — Progress Notes (Signed)
Assessment and plan reviewed. Patient seen with advanced practitioner's baseand examined personally

## 2017-10-31 ENCOUNTER — Other Ambulatory Visit (INDEPENDENT_AMBULATORY_CARE_PROVIDER_SITE_OTHER): Payer: Medicare Other

## 2017-10-31 ENCOUNTER — Ambulatory Visit (INDEPENDENT_AMBULATORY_CARE_PROVIDER_SITE_OTHER): Payer: Medicare Other | Admitting: Nurse Practitioner

## 2017-10-31 ENCOUNTER — Encounter: Payer: Self-pay | Admitting: Nurse Practitioner

## 2017-10-31 VITALS — BP 118/64 | HR 70 | Ht 62.5 in | Wt 112.0 lb

## 2017-10-31 DIAGNOSIS — R1031 Right lower quadrant pain: Secondary | ICD-10-CM

## 2017-10-31 DIAGNOSIS — R935 Abnormal findings on diagnostic imaging of other abdominal regions, including retroperitoneum: Secondary | ICD-10-CM

## 2017-10-31 DIAGNOSIS — I6350 Cerebral infarction due to unspecified occlusion or stenosis of unspecified cerebral artery: Secondary | ICD-10-CM

## 2017-10-31 LAB — CBC WITH DIFFERENTIAL/PLATELET
BASOS PCT: 0.6 % (ref 0.0–3.0)
Basophils Absolute: 0.1 10*3/uL (ref 0.0–0.1)
EOS ABS: 0.4 10*3/uL (ref 0.0–0.7)
Eosinophils Relative: 3.3 % (ref 0.0–5.0)
HEMATOCRIT: 41.1 % (ref 36.0–46.0)
Hemoglobin: 13.5 g/dL (ref 12.0–15.0)
LYMPHS PCT: 27.2 % (ref 12.0–46.0)
Lymphs Abs: 2.9 10*3/uL (ref 0.7–4.0)
MCHC: 32.9 g/dL (ref 30.0–36.0)
MCV: 87.4 fl (ref 78.0–100.0)
Monocytes Absolute: 0.8 10*3/uL (ref 0.1–1.0)
Monocytes Relative: 7.3 % (ref 3.0–12.0)
NEUTROS ABS: 6.6 10*3/uL (ref 1.4–7.7)
Neutrophils Relative %: 61.6 % (ref 43.0–77.0)
PLATELETS: 346 10*3/uL (ref 150.0–400.0)
RBC: 4.7 Mil/uL (ref 3.87–5.11)
RDW: 14.2 % (ref 11.5–15.5)
WBC: 10.7 10*3/uL — ABNORMAL HIGH (ref 4.0–10.5)

## 2017-10-31 LAB — BASIC METABOLIC PANEL
BUN: 12 mg/dL (ref 6–23)
CHLORIDE: 107 meq/L (ref 96–112)
CO2: 26 meq/L (ref 19–32)
CREATININE: 0.9 mg/dL (ref 0.40–1.20)
Calcium: 9.6 mg/dL (ref 8.4–10.5)
GFR: 64.56 mL/min (ref >60.00)
Glucose, Bld: 87 mg/dL (ref 70–99)
Potassium: 3.7 mEq/L (ref 3.5–5.1)
Sodium: 141 mEq/L (ref 135–145)

## 2017-10-31 MED ORDER — AMOXICILLIN-POT CLAVULANATE 875-125 MG PO TABS: 1.0000 | ORAL_TABLET | Freq: Two times a day (BID) | ORAL | 0 refills | Status: DC

## 2017-10-31 MED ORDER — ONDANSETRON HCL 4 MG PO TABS: 4.0000 mg | ORAL_TABLET | Freq: Three times a day (TID) | ORAL | 0 refills | Status: DC | PRN

## 2017-10-31 NOTE — Patient Instructions (Signed)
If you are age 77 or older, your body mass index should be between 23-30. Your Body mass index is 20.16 kg/m. If this is out of the aforementioned range listed, please consider follow up with your Primary Care Provider.  If you are age 36 or younger, your body mass index should be between 19-25. Your Body mass index is 20.16 kg/m. If this is out of the aformentioned range listed, please consider follow up with your Primary Care Provider.   We have sent the following medications to your pharmacy for you to pick up at your convenience: Augmentin '875mg'$  twice a day for 7 days.  Zofran '4mg'$  every 8 hours as needed.  Stop taking your Flagyl and Cipro.  You have been scheduled for a CT scan of the abdomen and pelvis at [facility] ([address]---this is in the same building as [facility]).   You are scheduled on 11/14/17 at 11:00am. You should arrive 15 minutes prior to your appointment time for registration. Please follow the written instructions below on the day of your exam:  WARNING: IF YOU ARE ALLERGIC TO IODINE/X-RAY DYE, PLEASE NOTIFY RADIOLOGY IMMEDIATELY AT [phone]! YOU WILL BE GIVEN A 13 HOUR PREMEDICATION PREP.  1) Do not eat or drink anything after 7:00am (4 hours prior to your test) 2) You have been given 2 bottles of oral contrast to drink. The solution may taste better if refrigerated, but do NOT add ice or any other liquid to this solution. Shake well before drinking.    Drink 1 bottle of contrast @ 9:00am (2 hours prior to your exam)  Drink 1 bottle of contrast @ 10:00am (1 hour prior to your exam)  You may take any medications as prescribed with a small amount of water except for the following: Metformin, Glucophage, Glucovance, Avandamet, Riomet, Fortamet, Actoplus Met, Janumet, Glumetza or Metaglip. The above medications must be held the day of the exam AND 48 hours after the exam.  The purpose of you drinking the oral contrast is to aid in the  visualization of your intestinal tract. The contrast solution may cause some diarrhea. Before your exam is started, you will be given a small amount of fluid to drink. Depending on your individual set of symptoms, you may also receive an intravenous injection of x-ray contrast/dye. Plan on being at [facility] for 30 minutes or longer, depending on the type of exam you are having performed.  This test typically takes 30-45 minutes to complete.  If you have any questions regarding your exam or if you need to reschedule, you may call the CT department at [phone] between the hours of 8:00 am and 5:00 pm, Monday-Friday.  ________________________________________________________________________  Thank you for choosing [facility], Tye Savoy, NP

## 2017-10-31 NOTE — Progress Notes (Signed)
Agree with assessment and plans. Keep me posted. Thank you, good work

## 2017-10-31 NOTE — Progress Notes (Signed)
Chief Complaint:  Follow up RLQ pain    ASSESSMENT AND PLAN;   77 year old female here for follow up from last week. She was seen for lower abdominal pain and CT scan suggesting wall thickening of the terminal ileum, cecum and ascending colon with patchy fat stranding and ill-defined fluid throughout the RLQ. She is on day #8 of antibiotics. Diffuse lower abdominal pain better but on exam she is still tender with fullness of RLQ. She has debilitating nausea (probably from flagyl), unable to eat.   -stop cipro / flagyl.   -Start Augmentin 875 mg BID x 7 days.  -zofran 4 mg prn nausea # 15 -Her abdominal exam is unchanged on day 8 of antibiotics. Will repeat CT scan. If she has signifcant improvement on Augmentin in the interim then may hold off on scan and just see me back in clinic next week. Otherwise I will call her with results.  -BMET to check renal function / electrolyptes, also repeat CBC.    HPI:     77 year old female who I saw last week with intermittent, diffuse lower abdominal pain and significant right lower quadrant tenderness.  PCP had obtained a CT scan which showed thickening of the terminal ileum and some eccentric wall thickening of the cecum, ascending colon.  There was ill-defined fluid throughout the right lower quadrant.  She was very tender on exam in the right lower quadrant where there was also some fullness .  PCP had just started her on Cipro and Flagyl that day .  Patient is back for follow-up .   Ana Santos's intermittent lower abdominal pain has resolved but she is still quite tender in the right lower quadrant.  A couple days after starting Cipro and Flagyl she developed severe diarrhea but that has resolved.  Her main complaint now is that of debilitating nausea.  She is unable to eat anything.  Ginger ale is the only thing she can tolerate.  No fevers.  She feels weak from not eating. Weight down 2 more pounds.    Past Medical History:  Diagnosis Date  .  AN (acoustic neuroma) ([place])   . Aneurysm ([place])   . Anxiety   . Cerebrovascular disease   . Cigarette smoker   . COPD (chronic obstructive pulmonary disease) ([place])   . Coronary artery disease   . Fibromyalgia   . Hyperlipidemia   . Hypothyroidism   . IBS (irritable bowel syndrome)   . Insomnia   . Mitral valve prolapse   . Nephrolithiasis   . Osteopenia   . Palpitations   . Tendinitis of right elbow   . Venous insufficiency   . Vitamin D deficiency     Family History  Problem Relation Age of Onset  . Heart attack Mother   . Diabetes Mother   . Cancer Father   . Diabetes Sister   . Diabetes Brother   . Cancer Sister        unkown type  . Lung cancer Brother        Lung  . AAA (abdominal aortic aneurysm) Neg Hx   . Colon cancer Neg Hx   . Stomach cancer Neg Hx     Current Outpatient Medications  Medication Sig Dispense Refill  . acetaminophen (TYLENOL) 500 MG tablet Take 500 mg by mouth every 6 (six) hours as needed for mild pain.     Marland Kitchen aspirin 325 MG tablet Take 325 mg by mouth daily.      Marland Kitchen  calcium gluconate 500 MG tablet Take 1 tablet by mouth daily.    . cholecalciferol (VITAMIN D) 1000 UNITS tablet Take 1,000 Units by mouth daily.      . clopidogrel (PLAVIX) 75 MG tablet TAKE 1 TABLET DAILY 90 tablet 1  . clorazepate (TRANXENE) 7.5 MG tablet Take 1 tablet (7.5 mg total) by mouth 3 (three) times daily as needed for anxiety. 90 tablet 5  . hydroxypropyl methylcellulose / hypromellose (ISOPTO TEARS / GONIOVISC) 2.5 % ophthalmic solution Place 1 drop into both eyes as needed for dry eyes.    Marland Kitchen levothyroxine (SYNTHROID) 75 MCG tablet Take 1 tablet (75 mcg total) daily before breakfast by mouth. 90 tablet 2  . metoprolol tartrate (LOPRESSOR) 25 MG tablet TAKE 1 TABLET TWICE A DAY 180 tablet 2  . pramipexole (MIRAPEX) 0.25 MG tablet TAKE 1 TABLET AT BEDTIME FOR RESTLESS LEG SYMPTOMS 90 tablet 1  . pravastatin (PRAVACHOL) 40 MG tablet TAKE 1 TABLET DAILY 90 tablet 2  .  traMADol (ULTRAM) 50 MG tablet Take 1 tablet (50 mg total) by mouth 3 (three) times daily as needed for moderate pain. 90 tablet 1   No current facility-administered medications for this visit.    Allergies  Allergen Reactions  . Atorvastatin Other (See Comments)     Lipitor caused arm pain (3/09)  . Hydrocodone-Acetaminophen Other (See Comments)    hallucinations  . Morphine Other (See Comments)    hallucinations  . Nortriptyline     Caused nausea and vomiting and shaking, kept her up all night  . Topamax [Topiramate] Nausea And Vomiting    *dizziness*    Review of Systems: No chest pain, no shortness of breath, no urinary symptoms.  No fevers.  Physical Exam:    Wt Readings from Last 3 Encounters:  10/31/17 112 lb (50.8 kg)  10/24/17 114 lb (51.7 kg)  10/23/17 112 lb (50.8 kg)    BP 118/64   Pulse 70   Ht 5' 2.5" (1.588 m)   Wt 112 lb (50.8 kg)   BMI 20.16 kg/m  Constitutional: Thin white female in no acute distress. Psychiatric: Normal mood and affect. Behavior is normal. EENT: Pupils normal.  Conjunctivae are normal. No scleral icterus. Neck supple.  Cardiovascular: Normal rate, regular rhythm. No edema Pulmonary/chest: Effort normal and breath sounds normal. No wheezing, rales or rhonchi. Abdominal: Soft, nondistended.  Bowel sounds active throughout. Moderate RLQ tenderness. Persistent fullness of RLQ Neurological: Alert and oriented to person place and time. Skin: Skin is warm and dry. No rashes noted.  Tye Savoy, NP  10/31/2017, 2:27 PM  Cc: Noralee Space, MD

## 2017-11-14 ENCOUNTER — Ambulatory Visit (INDEPENDENT_AMBULATORY_CARE_PROVIDER_SITE_OTHER)
Admission: RE | Admit: 2017-11-14 | Discharge: 2017-11-14 | Disposition: A | Discharge status: Home / Self Care | Payer: Medicare Other | Source: Ambulatory Visit | Attending: Nurse Practitioner | Admitting: Nurse Practitioner

## 2017-11-14 DIAGNOSIS — R1031 Right lower quadrant pain: Secondary | ICD-10-CM | POA: Diagnosis not present

## 2017-11-14 DIAGNOSIS — R935 Abnormal findings on diagnostic imaging of other abdominal regions, including retroperitoneum: Secondary | ICD-10-CM

## 2017-11-14 MED ORDER — IOPAMIDOL (ISOVUE-300) INJECTION 61%
100.0000 mL | Freq: Once | INTRAVENOUS | Status: AC | PRN
  Administered 2017-11-14: 100 mL via INTRAVENOUS

## 2017-11-19 ENCOUNTER — Telehealth: Payer: Self-pay

## 2017-11-19 NOTE — Telephone Encounter (Signed)
 [facility] Pre-operative Risk Assessment     Request for surgical clearance:     Endoscopy Procedure  What type of surgery is being performed?     colonoscopy  When is this surgery scheduled?     Pending   What type of clearance is required ?   Pharmacy  Are there any medications that need to be held prior to surgery and how long? Plavix - hold 5 to 7 days  Practice name and name of physician performing surgery?      [facility]  Dr Scarlette Shorts  What is your office phone and fax number?      Phone- [phone]  [phone]  Anesthesia type (None, local, MAC, general) ?       MAC

## 2017-11-29 ENCOUNTER — Telehealth: Payer: Self-pay | Admitting: Nurse Practitioner

## 2017-11-29 ENCOUNTER — Other Ambulatory Visit: Payer: Self-pay

## 2017-11-29 NOTE — Telephone Encounter (Signed)
Spoke with the patient. She has dates for the procedure and her instruction visit. I am waiting for instruction and clearance from  Dr Lenna Gilford about holding Plavix.

## 2017-11-30 NOTE — Telephone Encounter (Signed)
Appointment scheduled for the patient. Received fax from Dr Lenna Gilford giving clearance to hold the Plavix for 5 days prior to the procedure.

## 2017-11-30 NOTE — Telephone Encounter (Signed)
Fax received given authorization to hold the Plavix for 5 days prior to the colonoscopy. Letter sent to be scanned into the EMR.

## 2017-12-04 ENCOUNTER — Ambulatory Visit (AMBULATORY_SURGERY_CENTER): Payer: Self-pay

## 2017-12-04 ENCOUNTER — Other Ambulatory Visit: Payer: Self-pay

## 2017-12-04 VITALS — Ht 62.5 in | Wt 115.4 lb

## 2017-12-04 DIAGNOSIS — R1031 Right lower quadrant pain: Secondary | ICD-10-CM

## 2017-12-04 MED ORDER — PEG-KCL-NACL-NASULF-NA ASC-C 140 G PO SOLR: 1.0000 | Freq: Once | ORAL | 0 refills | Status: AC

## 2017-12-04 NOTE — Progress Notes (Signed)
Denies allergies to eggs or soy products. Denies complication of anesthesia or sedation. Denies use of weight loss medication. Denies use of O2.   Emmi instructions declined.  

## 2017-12-08 ENCOUNTER — Other Ambulatory Visit: Payer: Self-pay | Admitting: Pulmonary Disease

## 2017-12-10 ENCOUNTER — Encounter: Payer: Self-pay | Admitting: Internal Medicine

## 2017-12-10 ENCOUNTER — Ambulatory Visit (AMBULATORY_SURGERY_CENTER): Payer: Medicare Other | Admitting: Internal Medicine

## 2017-12-10 ENCOUNTER — Other Ambulatory Visit: Payer: Self-pay

## 2017-12-10 VITALS — BP 173/64 | HR 79 | Temp 97.8°F | Resp 25 | Ht 62.5 in | Wt 112.0 lb

## 2017-12-10 DIAGNOSIS — R1031 Right lower quadrant pain: Secondary | ICD-10-CM

## 2017-12-10 DIAGNOSIS — D120 Benign neoplasm of cecum: Secondary | ICD-10-CM | POA: Diagnosis not present

## 2017-12-10 DIAGNOSIS — D122 Benign neoplasm of ascending colon: Secondary | ICD-10-CM

## 2017-12-10 DIAGNOSIS — D124 Benign neoplasm of descending colon: Secondary | ICD-10-CM

## 2017-12-10 DIAGNOSIS — J449 Chronic obstructive pulmonary disease, unspecified: Secondary | ICD-10-CM | POA: Diagnosis not present

## 2017-12-10 DIAGNOSIS — D125 Benign neoplasm of sigmoid colon: Secondary | ICD-10-CM

## 2017-12-10 DIAGNOSIS — M797 Fibromyalgia: Secondary | ICD-10-CM | POA: Diagnosis not present

## 2017-12-10 DIAGNOSIS — R935 Abnormal findings on diagnostic imaging of other abdominal regions, including retroperitoneum: Secondary | ICD-10-CM | POA: Diagnosis not present

## 2017-12-10 DIAGNOSIS — Z8673 Personal history of transient ischemic attack (TIA), and cerebral infarction without residual deficits: Secondary | ICD-10-CM | POA: Diagnosis not present

## 2017-12-10 DIAGNOSIS — I10 Essential (primary) hypertension: Secondary | ICD-10-CM | POA: Diagnosis not present

## 2017-12-10 MED ORDER — SODIUM CHLORIDE 0.9 % IV SOLN
500.0000 mL | Freq: Once | INTRAVENOUS | Status: DC
Start: 2017-12-10 — End: 2018-02-04

## 2017-12-10 NOTE — Op Note (Signed)
[facility] Patient Name: Momina Hunton Procedure Date: 12/10/2017 11:17 AM MRN: [id number] Endoscopist: Docia Chuck. Henrene Pastor , MD Age: 77 Referring MD:  Date of Birth: 06-Jun-1941 Gender: Female Account #: [id number] Procedure:                Colonoscopy, with hot x 1 and cold snare                            polypectomies x 4 Indications:              Abdominal pain in the right lower quadrant,                            Abnormal CT of the GI tract Medicines:                Monitored Anesthesia Care Procedure:                Pre-Anesthesia Assessment:                           - Prior to the procedure, a History and Physical                            was performed, and patient medications and                            allergies were reviewed. The patient's tolerance of                            previous anesthesia was also reviewed. The risks                            and benefits of the procedure and the sedation                            options and risks were discussed with the patient.                            All questions were answered, and informed consent                            was obtained. Prior Anticoagulants: The patient has                            taken Plavix (clopidogrel), last dose was 6 days                            prior to procedure. ASA Grade Assessment: III - A                            patient with severe systemic disease. After                            reviewing the risks and benefits, the patient was  deemed in satisfactory condition to undergo the                            procedure.                           After obtaining informed consent, the colonoscope                            was passed under direct vision. Throughout the                            procedure, the patient's blood pressure, pulse, and                            oxygen saturations were monitored continuously. The      Colonoscope was introduced through the anus and                            advanced to the the cecum, identified by                            appendiceal orifice and ileocecal valve. The                            ileocecal valve, appendiceal orifice, and rectum                            were photographed. The quality of the bowel                            preparation was excellent. The colonoscopy was                            performed without difficulty. The patient tolerated                            the procedure well. The bowel preparation used was                            SUPREP. Scope In: 11:33:57 AM Scope Out: 12:21:58 PM Scope Withdrawal Time: 0 hours 43 minutes 38 seconds  Total Procedure Duration: 0 hours 48 minutes 1 second  Findings:                 A 15 mm polyp was found in the cecum. The polyp was                            sessile. The polyp was removed with a hot snare.                            Resection and retrieval were complete.                           Four polyps were found in the  sigmoid colon,                            descending colon and ascending colon. The polyps                            were 3 to 6 mm in size. These polyps were removed                            with a cold snare. Resection and retrieval were                            complete.                           A few small-mouthed diverticula were found in the                            sigmoid colon.                           Multiple attempts and cecal intubation were                            unsuccessful.The exam was otherwise without                            abnormality on direct and retroflexion views. Complications:            No immediate complications. Estimated blood loss:                            None. Estimated Blood Loss:     Estimated blood loss: none. Impression:               - One 15 mm polyp in the cecum, removed with a hot                            snare.  Resected and retrieved.                           - Four 3 to 6 mm polyps in the sigmoid colon, in                            the descending colon and in the ascending colon,                            removed with a cold snare. Resected and retrieved.                           - Diverticulosis in the sigmoid colon.                           - The examination was otherwise normal on direct  and retroflexion views. Recommendation:           - Repeat colonoscopy is not recommended for                            surveillance.                           - Resume Plavix (clopidogrel) today at prior dose.                           - Patient has a contact number available for                            emergencies. The signs and symptoms of potential                            delayed complications were discussed with the                            patient. Return to normal activities tomorrow.                            Written discharge instructions were provided to the                            patient.                           - Resume previous diet.                           - Continue present medications.                           - Await pathology results.                           - Please schedule CT ENTEROGRAPHY "right lower                            quadrant pain rule out distal small bowel lesion"                           - Office follow-up with Dr. Henrene Pastor or Tye Savoy                            after the above completed Docia Chuck. Henrene Pastor, MD 12/10/2017 12:38:03 PM This report has been signed electronically.

## 2017-12-10 NOTE — Progress Notes (Signed)
Report to PACU, RN, vss, BBS= Clear.  

## 2017-12-10 NOTE — Progress Notes (Signed)
Pt's states no medical or surgical changes since previsit or office visit. 

## 2017-12-10 NOTE — Patient Instructions (Signed)
YOU HAD AN ENDOSCOPIC PROCEDURE TODAY AT [facility]:   Refer to the procedure report that was given to you for any specific questions about what was found during the examination.  If the procedure report does not answer your questions, please call your gastroenterologist to clarify.  If you requested that your care partner not be given the details of your procedure findings, then the procedure report has been included in a sealed envelope for you to review at your convenience later.  YOU SHOULD EXPECT: Some feelings of bloating in the abdomen. Passage of more gas than usual.  Walking can help get rid of the air that was put into your GI tract during the procedure and reduce the bloating. If you had a lower endoscopy (such as a colonoscopy or flexible sigmoidoscopy) you may notice spotting of blood in your stool or on the toilet paper. If you underwent a bowel prep for your procedure, you may not have a normal bowel movement for a few days.  Please Note:  You might notice some irritation and congestion in your nose or some drainage.  This is from the oxygen used during your procedure.  There is no need for concern and it should clear up in a day or so.  SYMPTOMS TO REPORT IMMEDIATELY:   Following lower endoscopy (colonoscopy or flexible sigmoidoscopy):  Excessive amounts of blood in the stool  Significant tenderness or worsening of abdominal pains  Swelling of the abdomen that is new, acute  Fever of 100F or higher  For urgent or emergent issues, a gastroenterologist can be reached at any hour by calling [phone].   DIET:  We do recommend a small meal at first, but then you may proceed to your regular diet.  Drink plenty of fluids but you should avoid alcoholic beverages for 24 hours.  ACTIVITY:  You should plan to take it easy for the rest of today and you should NOT DRIVE or use heavy machinery until tomorrow (because of the sedation medicines used during the test).     FOLLOW UP: Our staff will call the number listed on your records the next business day following your procedure to check on you and address any questions or concerns that you may have regarding the information given to you following your procedure. If we do not reach you, we will leave a message.  However, if you are feeling well and you are not experiencing any problems, there is no need to return our call.  We will assume that you have returned to your regular daily activities without incident.  If any biopsies were taken you will be contacted by phone or by letter within the next 1-3 weeks.  Please call us at [phone] if you have not heard about the biopsies in 3 weeks.   Await for biopsy results Polyps (handout given) Diverticulosis (handout given) Hemorrhoids (handout given) Resume Plavix today  Office will follow up with scheduling pt a CT Enterography Office to follow up with Dr. Henrene Pastor or Tye Savoy after the above is completed   SIGNATURES/CONFIDENTIALITY: You and/or your care partner have signed paperwork which will be entered into your electronic medical record.  These signatures attest to the fact that that the information above on your After Visit Summary has been reviewed and is understood.  Full responsibility of the confidentiality of this discharge information lies with you and/or your care-partner.

## 2017-12-10 NOTE — Progress Notes (Signed)
Called to room to assist during endoscopic procedure.  Patient ID and intended procedure confirmed with present staff. Received instructions for my participation in the procedure from the performing physician.  

## 2017-12-11 ENCOUNTER — Telehealth: Payer: Self-pay

## 2017-12-11 ENCOUNTER — Other Ambulatory Visit: Payer: Self-pay

## 2017-12-11 DIAGNOSIS — R1031 Right lower quadrant pain: Secondary | ICD-10-CM

## 2017-12-11 NOTE — Telephone Encounter (Signed)
Pt scheduled for CT entero of A/P at [facility] CT 12/18/17@2 :30pm, pt to arrive there at 1pm to drink contrast. Pt to arrive there at 1pm, have no solid food after 10:30am. Pt to come for labs prior to scan. Pt aware of appt and instructions.

## 2017-12-11 NOTE — Telephone Encounter (Signed)
  Follow up Call-  Call back number 12/10/2017  Post procedure Call Back phone  # [phone]  Permission to leave phone message Yes  Some recent data might be hidden     Patient questions:  Do you have a fever, pain , or abdominal swelling? No. Pain Score  0 *  Have you tolerated food without any problems? Yes.    Have you been able to return to your normal activities? Yes.    Do you have any questions about your discharge instructions: Diet   No. Medications  No. Follow up visit  No.  Do you have questions or concerns about your Care? No.  Actions: * If pain score is 4 or above: No action needed, pain <4.

## 2017-12-17 ENCOUNTER — Encounter: Payer: Self-pay | Admitting: Internal Medicine

## 2017-12-17 ENCOUNTER — Other Ambulatory Visit (INDEPENDENT_AMBULATORY_CARE_PROVIDER_SITE_OTHER): Payer: Medicare Other

## 2017-12-17 DIAGNOSIS — R1031 Right lower quadrant pain: Secondary | ICD-10-CM | POA: Diagnosis not present

## 2017-12-17 LAB — CREATININE, SERUM: Creatinine, Ser: 0.92 mg/dL (ref 0.40–1.20)

## 2017-12-17 LAB — BUN: BUN: 18 mg/dL (ref 6–23)

## 2017-12-18 ENCOUNTER — Ambulatory Visit (INDEPENDENT_AMBULATORY_CARE_PROVIDER_SITE_OTHER)
Admission: RE | Admit: 2017-12-18 | Discharge: 2017-12-18 | Disposition: A | Discharge status: Home / Self Care | Payer: Medicare Other | Source: Ambulatory Visit | Attending: Internal Medicine | Admitting: Internal Medicine

## 2017-12-18 ENCOUNTER — Telehealth: Payer: Self-pay

## 2017-12-18 DIAGNOSIS — R1031 Right lower quadrant pain: Secondary | ICD-10-CM | POA: Diagnosis not present

## 2017-12-18 DIAGNOSIS — D3502 Benign neoplasm of left adrenal gland: Secondary | ICD-10-CM | POA: Diagnosis not present

## 2017-12-18 MED ORDER — IOPAMIDOL (ISOVUE-300) INJECTION 61%
100.0000 mL | Freq: Once | INTRAVENOUS | Status: AC | PRN
  Administered 2017-12-18: 100 mL via INTRAVENOUS

## 2017-12-18 NOTE — Telephone Encounter (Signed)
Radiology called CT report alert particularly impression #1, #3 , patient had CT abdomen/pelvis today. Let them know I would route message to Dr. Henrene Pastor.

## 2017-12-20 ENCOUNTER — Other Ambulatory Visit: Payer: Self-pay

## 2017-12-25 ENCOUNTER — Other Ambulatory Visit: Payer: Self-pay | Admitting: *Deleted

## 2017-12-31 ENCOUNTER — Other Ambulatory Visit: Payer: Self-pay | Admitting: Podiatry

## 2017-12-31 ENCOUNTER — Encounter: Payer: Self-pay | Admitting: Podiatry

## 2017-12-31 ENCOUNTER — Ambulatory Visit (INDEPENDENT_AMBULATORY_CARE_PROVIDER_SITE_OTHER): Payer: Medicare Other

## 2017-12-31 ENCOUNTER — Ambulatory Visit (INDEPENDENT_AMBULATORY_CARE_PROVIDER_SITE_OTHER): Payer: Medicare Other | Admitting: Podiatry

## 2017-12-31 VITALS — BP 163/83 | HR 73 | Temp 98.6°F | Resp 16 | Ht 63.0 in | Wt 115.0 lb

## 2017-12-31 DIAGNOSIS — M6740 Ganglion, unspecified site: Secondary | ICD-10-CM

## 2017-12-31 DIAGNOSIS — M19079 Primary osteoarthritis, unspecified ankle and foot: Secondary | ICD-10-CM

## 2017-12-31 DIAGNOSIS — M67472 Ganglion, left ankle and foot: Secondary | ICD-10-CM

## 2017-12-31 DIAGNOSIS — M19072 Primary osteoarthritis, left ankle and foot: Secondary | ICD-10-CM | POA: Diagnosis not present

## 2017-12-31 NOTE — Progress Notes (Signed)
   Subjective:    Patient ID: Ana Santos, female    DOB: [birth year], 77 y.o.   MRN: [id number]  HPI    Review of Systems  All other systems reviewed and are negative.      Objective:   Physical Exam        Assessment & Plan:

## 2017-12-31 NOTE — Progress Notes (Signed)
Subjective:  Patient ID: Ana Santos, female    DOB: 1940-11-24,  MRN: [id number]  Chief Complaint  Patient presents with  . Cyst    L dorsal foot (midfoot) x 3 wks; 3/10 achy pain -no injury Tx: none     77 y.o. female presents with the above complaint.  Reports a knot to the top of the left foot.  Present for the past 3 weeks.  States that he gets worse at night.  Reports 3-10 aching pain.  Has difficulty wearing certain shoes because of it.  Denies injury denies prior treatment  Past Medical History:  Diagnosis Date  . AN (acoustic neuroma) ([place])   . Aneurysm ([place])   . Anxiety   . Cataract   . Cerebrovascular disease   . Cigarette smoker   . COPD (chronic obstructive pulmonary disease) ([place])   . Coronary artery disease   . Depression   . Fibromyalgia   . Heart murmur   . Hyperlipidemia   . Hypothyroidism   . IBS (irritable bowel syndrome)   . Insomnia   . Mitral valve prolapse   . Nephrolithiasis   . Osteopenia   . Palpitations   . Stroke ([place])   . Tendinitis of right elbow   . Venous insufficiency   . Vitamin D deficiency    Past Surgical History:  Procedure Laterality Date  . ANEURYSM COILING  11-10   Dr. Estanislado Pandy  . BREAST LUMPECTOMY    . LEFT HEART CATHETERIZATION WITH CORONARY ANGIOGRAM N/A 08/05/2014   Procedure: LEFT HEART CATHETERIZATION WITH CORONARY ANGIOGRAM;  Surgeon: Wellington Hampshire, MD;  Location: [facility];  Service: Cardiovascular;  Laterality: N/A;  . TOTAL ABDOMINAL HYSTERECTOMY    . TRANSLABYRINTHINE PROCEDURE  2002   [facility] Dr. Vicie Mutters    Current Outpatient Medications:  .  acetaminophen (TYLENOL) 500 MG tablet, Take 500 mg by mouth every 6 (six) hours as needed for mild pain. , Disp: , Rfl:  .  aspirin 325 MG tablet, Take 325 mg by mouth daily.  , Disp: , Rfl:  .  calcium gluconate 500 MG tablet, Take 1 tablet by mouth daily., Disp: , Rfl:  .  cholecalciferol (VITAMIN D) 1000 UNITS tablet, Take 1,000 Units by mouth daily.  , Disp: ,  Rfl:  .  clopidogrel (PLAVIX) 75 MG tablet, TAKE 1 TABLET DAILY, Disp: 90 tablet, Rfl: 1 .  clorazepate (TRANXENE) 7.5 MG tablet, Take 1 tablet (7.5 mg total) by mouth 3 (three) times daily as needed for anxiety., Disp: 90 tablet, Rfl: 5 .  hydroxypropyl methylcellulose / hypromellose (ISOPTO TEARS / GONIOVISC) 2.5 % ophthalmic solution, Place 1 drop into both eyes as needed for dry eyes., Disp: , Rfl:  .  levothyroxine (SYNTHROID) 75 MCG tablet, Take 1 tablet (75 mcg total) daily before breakfast by mouth., Disp: 90 tablet, Rfl: 2 .  metoprolol tartrate (LOPRESSOR) 25 MG tablet, TAKE 1 TABLET TWICE A DAY, Disp: 180 tablet, Rfl: 2 .  ondansetron (ZOFRAN) 4 MG tablet, Take 1 tablet (4 mg total) by mouth every 8 (eight) hours as needed for nausea or vomiting., Disp: 30 tablet, Rfl: 0 .  pramipexole (MIRAPEX) 0.25 MG tablet, TAKE 1 TABLET AT BEDTIME FOR RESTLESS LEG SYMPTOMS, Disp: 90 tablet, Rfl: 1 .  pravastatin (PRAVACHOL) 40 MG tablet, TAKE 1 TABLET DAILY, Disp: 90 tablet, Rfl: 2 .  traMADol (ULTRAM) 50 MG tablet, Take 1 tablet (50 mg total) by mouth 3 (three) times daily as needed for moderate pain.,  Disp: 90 tablet, Rfl: 1  Current Facility-Administered Medications:  .  0.9 %  sodium chloride infusion, 500 mL, Intravenous, Once, Irene Shipper, MD  Allergies  Allergen Reactions  . Atorvastatin Other (See Comments)     Lipitor caused arm pain (3/09)  . Hydrocodone-Acetaminophen Other (See Comments)    hallucinations  . Morphine Other (See Comments)    hallucinations  . Nortriptyline     Caused nausea and vomiting and shaking, kept her up all night  . Topamax [Topiramate] Nausea And Vomiting    *dizziness*   Review of Systems: Negative except as noted in the HPI. Denies N/V/F/Ch. Objective:   Vitals:   12/31/17 0916  BP: (!) 163/83  Pulse: 73  Resp: 16  Temp: 98.6 F (37 C)   General AA&O x3. Normal mood and affect.  Vascular Dorsalis pedis and posterior tibial pulses  present  2+ bilaterally  Capillary refill normal to all digits. Pedal hair growth normal.  Neurologic Epicritic sensation grossly present.  Dermatologic No open lesions. Interspaces clear of maceration. Nails well groomed and normal in appearance. Fluctuant cyst at the dorsal aspect of the fourth metatarsal proximally  Orthopedic: MMT 5/5 in dorsiflexion, plantarflexion, inversion, and eversion. Normal joint ROM without pain or crepitus. Prominent dorsal midfoot osteophytes   Assessment & Plan:  Patient was evaluated and treated and all questions answered.  Ganglion Cyst -X-rays taken reviewed no acute fracture dislocations no osseous prominence of the area of concern. -Aspiration guidance is performed as below  Procedure: Aspiration ganglion Anesthesia: 2 cc 1% lidocaine plain Instrumentation: 18g needle Technique: Local block was performed.  Following ensuring anesthesia the area was gently prepped with Betadine.  A digital was used to puncture the capsule. Aspiration was attempted but no return noted. The ganglion cyst was expressed through the needle puncture site in the skin. Dressing: Dry, sterile, compression dressing. Disposition: Patient tolerated procedure well.   Midfoot arthritis -No injection today.   Return if symptoms worsen or fail to improve.

## 2017-12-31 NOTE — Patient Instructions (Signed)
Ganglion Cyst A ganglion cyst is a noncancerous, fluid-filled lump that occurs near joints or tendons. The ganglion cyst grows out of a joint or the lining of a tendon. It most often develops in the hand or wrist, but it can also develop in the shoulder, elbow, hip, knee, ankle, or foot. The round or oval ganglion cyst can be the size of a pea or larger than a grape. Increased activity may enlarge the size of the cyst because more fluid starts to build up. What are the causes? It is not known what causes a ganglion cyst to grow. However, it may be related to:  Inflammation or irritation around the joint.  An injury.  Repetitive movements or overuse.  Arthritis.  What increases the risk? Risk factors include:  Being a woman.  Being age 20-50.  What are the signs or symptoms? Symptoms may include:  A lump. This most often appears on the hand or wrist, but it can occur in other areas of the body.  Tingling.  Pain.  Numbness.  Muscle weakness.  Weak grip.  Less movement in a joint.  How is this diagnosed? Ganglion cysts are most often diagnosed based on a physical exam. Your health care provider will feel the lump and may shine a light alongside it. If it is a ganglion cyst, a light often shines through it. Your health care provider may order an X-ray, ultrasound, or MRI to rule out other conditions. How is this treated? Ganglion cysts usually go away on their own without treatment. If pain or other symptoms are involved, treatment may be needed. Treatment is also needed if the ganglion cyst limits your movement or if it gets infected. Treatment may include:  Wearing a brace or splint on your wrist or finger.  Taking anti-inflammatory medicine.  Draining fluid from the lump with a needle (aspiration).  Injecting a steroid into the joint.  Surgery to remove the ganglion cyst.  Follow these instructions at home:  Do not press on the ganglion cyst, poke it with a  needle, or hit it.  Take medicines only as directed by your health care provider.  Wear your brace or splint as directed by your health care provider.  Watch your ganglion cyst for any changes.  Keep all follow-up visits as directed by your health care provider. This is important. Contact a health care provider if:  Your ganglion cyst becomes larger or more painful.  You have increased redness, red streaks, or swelling.  You have pus coming from the lump.  You have weakness or numbness in the affected area.  You have a fever or chills. This information is not intended to replace advice given to you by your health care provider. Make sure you discuss any questions you have with your health care provider. Document Released: 05/26/2000 Document Revised: 11/04/2015 Document Reviewed: 11/11/2013 Elsevier Interactive Patient Education  2018 [employer]  

## 2018-01-03 ENCOUNTER — Encounter: Payer: Medicare Other | Admitting: Internal Medicine

## 2018-01-08 DIAGNOSIS — Z7982 Long term (current) use of aspirin: Secondary | ICD-10-CM | POA: Diagnosis not present

## 2018-01-08 DIAGNOSIS — Z79899 Other long term (current) drug therapy: Secondary | ICD-10-CM | POA: Diagnosis not present

## 2018-01-08 DIAGNOSIS — F17200 Nicotine dependence, unspecified, uncomplicated: Secondary | ICD-10-CM | POA: Diagnosis not present

## 2018-01-08 DIAGNOSIS — M79662 Pain in left lower leg: Secondary | ICD-10-CM | POA: Diagnosis not present

## 2018-01-08 DIAGNOSIS — I4891 Unspecified atrial fibrillation: Secondary | ICD-10-CM | POA: Diagnosis not present

## 2018-01-08 DIAGNOSIS — Z8673 Personal history of transient ischemic attack (TIA), and cerebral infarction without residual deficits: Secondary | ICD-10-CM | POA: Diagnosis not present

## 2018-01-08 DIAGNOSIS — I10 Essential (primary) hypertension: Secondary | ICD-10-CM | POA: Diagnosis not present

## 2018-01-11 DIAGNOSIS — R1031 Right lower quadrant pain: Secondary | ICD-10-CM | POA: Diagnosis not present

## 2018-01-18 DIAGNOSIS — R350 Frequency of micturition: Secondary | ICD-10-CM | POA: Diagnosis not present

## 2018-01-18 DIAGNOSIS — D414 Neoplasm of uncertain behavior of bladder: Secondary | ICD-10-CM | POA: Diagnosis not present

## 2018-01-21 ENCOUNTER — Other Ambulatory Visit: Payer: Self-pay | Admitting: Pulmonary Disease

## 2018-01-24 DIAGNOSIS — C672 Malignant neoplasm of lateral wall of bladder: Secondary | ICD-10-CM | POA: Diagnosis not present

## 2018-01-30 ENCOUNTER — Other Ambulatory Visit: Payer: Self-pay | Admitting: Urology

## 2018-01-31 ENCOUNTER — Other Ambulatory Visit: Payer: Self-pay | Admitting: Pulmonary Disease

## 2018-02-04 ENCOUNTER — Other Ambulatory Visit: Payer: Self-pay

## 2018-02-04 ENCOUNTER — Encounter (HOSPITAL_BASED_OUTPATIENT_CLINIC_OR_DEPARTMENT_OTHER): Payer: Self-pay | Admitting: *Deleted

## 2018-02-04 NOTE — Progress Notes (Signed)
Spoke with [name] after midnight, arrive 530 am 02-08-18 [facility] meds to take with sip of water: clorazepate prn, metorprolol tartrate Synthroid Records on chart/epic: chest xray 04-25-17 epic, lov dr Lenna Gilford pulmonary 04-25-17 Driver spouse ronnie will stay for surgery Orders in epic Needs ekg and istat 4

## 2018-02-07 NOTE — H&P (Signed)
@media  print    Office Visit Report 01/24/2018    Ana Santos         MRN: [id number]  PRIMARY CARE:  Lavena Bullion, MD  DOB: 07-31-1940, 77 year old Female  REFERRING:  Lavena Bullion, MD  SSN:   PROVIDER:  Festus Aloe, M.D.    LOCATION:  [facility] [phone]    CC: I have bladder cancer that has been treated.  HPI: Ana Santos is a 77 year-old female established patient who is here for follow-up of bladder cancer treatment.    Consultation for bladder mass. Patient underwent CT scan of the abdomen and pelvis May June and July 2019 in evaluation of a possible ileocecal mass. This shows subtle irregularity of the right posterior bladder wall. There is a small nodule measuring 6 or 7 mm and another linear area of mucosal hyperenhancement more inferior measuring 15 mm. She saw Dr. Excell Seltzer who is considering diagnostic laparoscopy with ileocecectomy.   She has frequency and nocturia. Noc x 8-10. She voids q 1.5 hrs. Symptoms for a few months. UA clear. No gross hematuria. She is a smoker.   She returns for cystoscopy. No gross hematuria.     ALLERGIES: atorvastatin Hydrocodone-Acetaminophen Morphine nortriptyline Topamax    MEDICATIONS: Levothyroxine Sodium 75 mcg tablet  Metoprolol Tartrate 25 mg tablet  Acetaminophen 500 mg tablet  Aspir 81  Calcium Gluconate  Cholecalciferol  Clopidogrel 75 mg tablet  Clorazepate Dipotassium 7.5 mg tablet  Hydroxypropylcellulose  Ondansetron Hcl 4 mg tablet  Pramipexole Dihydrochloride 0.25 mg tablet  Pravastatin Sodium 40 mg tablet  Tramadol Hcl 50 mg tablet     GU PSH: None      PSH Notes: acoustic tumor    NON-GU PSH: Brain Aneurysm Repr; Complx Hysterectomy          GU PMH: Bladder tumor/neoplasm - 01/18/2018 Urinary Frequency - 01/18/2018     NON-GU PMH: Anxiety Hypercholesterolemia Stroke/TIA      FAMILY HISTORY: 5 sons - No Family History Kidney Failure - Mother Lung Cancer - Father stroke  - Mother     SOCIAL HISTORY: Marital Status: Married Preferred Language: English; Race: White Current Smoking Status: Patient smokes.  <DIV'  Tobacco Use Assessment Completed:  Used Tobacco in last 30 days?   Does not use smokeless tobacco. Has never drank.  Drinks 2 caffeinated drinks per day. Has not had a blood transfusion.      REVIEW OF SYSTEMS:       GU Review Female:  Patient denies frequent urination, hard to postpone urination, burning /pain with urination, get up at night to urinate, leakage of urine, stream starts and stops, trouble starting your stream, have to strain to urinate, and being pregnant.      Gastrointestinal (Upper):  Patient denies nausea, vomiting, and indigestion/ heartburn.      Gastrointestinal (Lower):  Patient denies diarrhea and constipation.      Constitutional:  Patient denies fever, night sweats, weight loss, and fatigue.      Skin:  Patient denies skin rash/ lesion and itching.      Eyes:  Patient denies blurred vision and double vision.      Ears/ Nose/ Throat:  Patient denies sore throat and sinus problems.      Hematologic/Lymphatic:  Patient denies swollen glands and easy bruising.      Cardiovascular:  Patient denies leg swelling and chest pains.      Respiratory:  Patient denies cough and shortness  of breath.      Endocrine:  Patient denies excessive thirst.      Musculoskeletal:  Patient denies back pain and joint pain.      Neurological:  Patient denies headaches and dizziness.      Psychologic:  Patient denies depression and anxiety.      VITAL SIGNS:         01/24/2018 02:53 PM       BP 164/71 mmHg       Pulse 65 /min       Temperature 98.3 F / 36.8 C       GU PHYSICAL EXAMINATION:        Urethral Meatus: Normal size. Normal position. No discharge.       Urethra: No tenderness, no mass, no scarring. No hypermobility. No leakage.       Vagina: Moderate vaginal atrophy. No stenosis. No rectocele. No cystocele. No enterocele.          MULTI-SYSTEM PHYSICAL EXAMINATION:        Constitutional: Well-nourished. No physical deformities. Normally developed. Good grooming.       Neck: Neck symmetrical, not swollen. Normal tracheal position.       Respiratory: No labored breathing, no use of accessory muscles.        Cardiovascular: Normal temperature, normal extremity pulses, no swelling, no varicosities.       Neurologic / Psychiatric: Oriented to time, oriented to place, oriented to person. No depression, no anxiety, no agitation.       Gastrointestinal: No mass, no tenderness, no rigidity, non obese abdomen.                PAST DATA REVIEWED:   Source Of History:  Patient   PROCEDURES:    Flexible Cystoscopy - 52000  Risks, benefits, and some of the potential complications of the procedure were discussed at length with the patient including infection, bleeding, voiding discomfort, urinary retention, fever, chills, sepsis, and others. All questions were answered. Informed consent was obtained. Antibiotic prophylaxis was given. Sterile technique and intraurethral analgesia were used. Chaperone - Lauren - for exam and cystoscopy.   Meatus:  Normal size. Normal location. Normal condition.  Urethra:  No hypermobility. No leakage.  Ureteral Orifices:  Normal location. Normal size. Normal shape. Effluxed clear urine.  Bladder:  A few right lateral wall tumors - clustered together. No trabeculation. Normal mucosa. No stones.      The lower urinary tract was carefully examined. The procedure was well-tolerated and without complications. Antibiotic instructions were given. Instructions were given to call the office immediately for bloody urine, difficulty urinating, urinary retention, painful or frequent urination, fever, chills, nausea, vomiting or other illness. The patient stated that she understood these instructions and would comply with them.    Urinalysis  Dipstick Dipstick Cont'd  Color: Yellow Bilirubin: Neg mg/dL   Appearance: Clear Ketones: Neg mg/dL  Specific Gravity: 1.020 Blood: Neg ery/uL  pH: <=5.0 Protein: Trace mg/dL  Glucose: Neg mg/dL Urobilinogen: 1.0 mg/dL   Nitrites: Neg   Leukocyte Esterase: Neg leu/uL    ASSESSMENT:     ICD-10 Details  1 GU:  Bladder tumor/neoplasm - D41.4 Stable   PLAN:   Schedule  Return Visit/Planned Activity: Next Available Appointment - Schedule Surgery  Document  Letter(s):  Created for Patient: Clinical Summary   Notes:  bladder tumor - discussed with patient and her husband the nature r/b/a to TURBT with post op gemcitabine. We discussed she may need a foley  post op and she may need a staged procedure with f/u biopsies, however none of that should preclude Dr. Excell Seltzer from proceeding with bowel surgery.   cc: Dr. Excell Seltzer    * Signed by Festus Aloe, M.D. on 01/24/18 at 3:55 PM (EDT)*   The information contained in this medical record document is considered private and confidential patient information. This information can only be used for the medical diagnosis and/or medical services that are being provided by the patient's selected caregivers. This information can only be distributed outside of the patient's care if the patient agrees and signs waivers of authorization for this information to be sent to an outside source or route.

## 2018-02-08 ENCOUNTER — Encounter (HOSPITAL_BASED_OUTPATIENT_CLINIC_OR_DEPARTMENT_OTHER): Payer: Self-pay | Admitting: Anesthesiology

## 2018-02-08 ENCOUNTER — Ambulatory Visit (HOSPITAL_BASED_OUTPATIENT_CLINIC_OR_DEPARTMENT_OTHER): Payer: Medicare Other | Admitting: Anesthesiology

## 2018-02-08 ENCOUNTER — Ambulatory Visit (HOSPITAL_BASED_OUTPATIENT_CLINIC_OR_DEPARTMENT_OTHER)
Admission: RE | Admit: 2018-02-08 | Discharge: 2018-02-08 | Disposition: A | Discharge status: Home / Self Care | Payer: Medicare Other | Source: Ambulatory Visit | Attending: Urology | Admitting: Urology

## 2018-02-08 ENCOUNTER — Other Ambulatory Visit: Payer: Self-pay

## 2018-02-08 ENCOUNTER — Encounter (HOSPITAL_BASED_OUTPATIENT_CLINIC_OR_DEPARTMENT_OTHER)
Admission: RE | Disposition: A | Discharge status: Home / Self Care | Payer: Self-pay | Source: Ambulatory Visit | Attending: Urology

## 2018-02-08 DIAGNOSIS — N3289 Other specified disorders of bladder: Secondary | ICD-10-CM | POA: Diagnosis not present

## 2018-02-08 DIAGNOSIS — J449 Chronic obstructive pulmonary disease, unspecified: Secondary | ICD-10-CM | POA: Diagnosis not present

## 2018-02-08 DIAGNOSIS — I2510 Atherosclerotic heart disease of native coronary artery without angina pectoris: Secondary | ICD-10-CM | POA: Diagnosis not present

## 2018-02-08 DIAGNOSIS — D494 Neoplasm of unspecified behavior of bladder: Secondary | ICD-10-CM | POA: Diagnosis not present

## 2018-02-08 DIAGNOSIS — F329 Major depressive disorder, single episode, unspecified: Secondary | ICD-10-CM | POA: Insufficient documentation

## 2018-02-08 DIAGNOSIS — I739 Peripheral vascular disease, unspecified: Secondary | ICD-10-CM | POA: Insufficient documentation

## 2018-02-08 DIAGNOSIS — C674 Malignant neoplasm of posterior wall of bladder: Secondary | ICD-10-CM | POA: Insufficient documentation

## 2018-02-08 DIAGNOSIS — F419 Anxiety disorder, unspecified: Secondary | ICD-10-CM | POA: Insufficient documentation

## 2018-02-08 DIAGNOSIS — Z79899 Other long term (current) drug therapy: Secondary | ICD-10-CM | POA: Insufficient documentation

## 2018-02-08 DIAGNOSIS — C679 Malignant neoplasm of bladder, unspecified: Secondary | ICD-10-CM | POA: Diagnosis not present

## 2018-02-08 DIAGNOSIS — I341 Nonrheumatic mitral (valve) prolapse: Secondary | ICD-10-CM | POA: Diagnosis not present

## 2018-02-08 DIAGNOSIS — E039 Hypothyroidism, unspecified: Secondary | ICD-10-CM | POA: Diagnosis not present

## 2018-02-08 DIAGNOSIS — Z8673 Personal history of transient ischemic attack (TIA), and cerebral infarction without residual deficits: Secondary | ICD-10-CM | POA: Diagnosis not present

## 2018-02-08 DIAGNOSIS — Z888 Allergy status to other drugs, medicaments and biological substances status: Secondary | ICD-10-CM | POA: Diagnosis not present

## 2018-02-08 DIAGNOSIS — E785 Hyperlipidemia, unspecified: Secondary | ICD-10-CM | POA: Diagnosis not present

## 2018-02-08 DIAGNOSIS — E7800 Pure hypercholesterolemia, unspecified: Secondary | ICD-10-CM | POA: Insufficient documentation

## 2018-02-08 DIAGNOSIS — D414 Neoplasm of uncertain behavior of bladder: Secondary | ICD-10-CM

## 2018-02-08 DIAGNOSIS — Z885 Allergy status to narcotic agent status: Secondary | ICD-10-CM | POA: Insufficient documentation

## 2018-02-08 DIAGNOSIS — C672 Malignant neoplasm of lateral wall of bladder: Secondary | ICD-10-CM | POA: Diagnosis not present

## 2018-02-08 DIAGNOSIS — M797 Fibromyalgia: Secondary | ICD-10-CM | POA: Diagnosis not present

## 2018-02-08 DIAGNOSIS — Z7982 Long term (current) use of aspirin: Secondary | ICD-10-CM | POA: Insufficient documentation

## 2018-02-08 HISTORY — DX: Headache: R51

## 2018-02-08 HISTORY — DX: Unspecified hearing loss, right ear: H91.91

## 2018-02-08 HISTORY — DX: Headache, unspecified: R51.9

## 2018-02-08 HISTORY — DX: Personal history of urinary calculi: Z87.442

## 2018-02-08 HISTORY — PX: TRANSURETHRAL RESECTION OF BLADDER TUMOR: SHX2575

## 2018-02-08 LAB — POCT I-STAT 4, (NA,K, GLUC, HGB,HCT)
GLUCOSE: 80 mg/dL (ref 70–99)
HEMATOCRIT: 38 % (ref 36.0–46.0)
Hemoglobin: 12.9 g/dL (ref 12.0–15.0)
Potassium: 3.8 mmol/L (ref 3.5–5.1)
Sodium: 144 mmol/L (ref 135–145)

## 2018-02-08 SURGERY — TURBT (TRANSURETHRAL RESECTION OF BLADDER TUMOR)
Anesthesia: General

## 2018-02-08 MED ORDER — FENTANYL CITRATE (PF) 100 MCG/2ML IJ SOLN
INTRAMUSCULAR | Status: AC
  Filled 2018-02-08: qty 2

## 2018-02-08 MED ORDER — SODIUM CHLORIDE 0.9 % IR SOLN
Status: DC | PRN
  Administered 2018-02-08: 6000 mL via INTRAVESICAL

## 2018-02-08 MED ORDER — GEMCITABINE CHEMO FOR BLADDER INSTILLATION 2000 MG
2000.0000 mg | Freq: Once | INTRAVENOUS | Status: AC
  Administered 2018-02-08: 2000 mg via INTRAVESICAL
  Filled 2018-02-08: qty 52.6
  Filled 2018-02-08: qty 2000

## 2018-02-08 MED ORDER — FENTANYL CITRATE (PF) 100 MCG/2ML IJ SOLN
INTRAMUSCULAR | Status: DC | PRN
  Administered 2018-02-08 (×2): 25 ug via INTRAVENOUS

## 2018-02-08 MED ORDER — OXYCODONE HCL 5 MG/5ML PO SOLN
5.0000 mg | Freq: Once | ORAL | Status: DC | PRN
  Filled 2018-02-08: qty 5

## 2018-02-08 MED ORDER — LACTATED RINGERS IV SOLN
INTRAVENOUS | Status: DC
  Filled 2018-02-08: qty 1000

## 2018-02-08 MED ORDER — PROPOFOL 10 MG/ML IV BOLUS
INTRAVENOUS | Status: AC
  Filled 2018-02-08: qty 40

## 2018-02-08 MED ORDER — PROMETHAZINE HCL 25 MG/ML IJ SOLN
6.2500 mg | INTRAMUSCULAR | Status: DC | PRN
  Filled 2018-02-08: qty 1

## 2018-02-08 MED ORDER — OXYCODONE HCL 5 MG PO TABS
5.0000 mg | ORAL_TABLET | Freq: Once | ORAL | Status: DC | PRN
  Filled 2018-02-08: qty 1

## 2018-02-08 MED ORDER — BELLADONNA ALKALOIDS-OPIUM 16.2-60 MG RE SUPP
RECTAL | Status: AC
  Filled 2018-02-08: qty 1

## 2018-02-08 MED ORDER — NITROFURANTOIN MONOHYD MACRO 100 MG PO CAPS: 100.0000 mg | ORAL_CAPSULE | Freq: Every day | ORAL | 0 refills | Status: AC

## 2018-02-08 MED ORDER — DEXAMETHASONE SODIUM PHOSPHATE 10 MG/ML IJ SOLN
INTRAMUSCULAR | Status: DC | PRN
  Administered 2018-02-08: 5 mg via INTRAVENOUS

## 2018-02-08 MED ORDER — LIDOCAINE 2% (20 MG/ML) 5 ML SYRINGE
INTRAMUSCULAR | Status: AC
  Filled 2018-02-08: qty 5

## 2018-02-08 MED ORDER — DEXAMETHASONE SODIUM PHOSPHATE 10 MG/ML IJ SOLN
INTRAMUSCULAR | Status: AC
  Filled 2018-02-08: qty 1

## 2018-02-08 MED ORDER — ONDANSETRON HCL 4 MG/2ML IJ SOLN
INTRAMUSCULAR | Status: DC | PRN
  Administered 2018-02-08: 4 mg via INTRAVENOUS

## 2018-02-08 MED ORDER — LACTATED RINGERS IV SOLN
INTRAVENOUS | Status: DC
  Administered 2018-02-08: 1000 mL via INTRAVENOUS
  Filled 2018-02-08: qty 1000

## 2018-02-08 MED ORDER — ONDANSETRON HCL 4 MG/2ML IJ SOLN
INTRAMUSCULAR | Status: AC
  Filled 2018-02-08: qty 2

## 2018-02-08 MED ORDER — CEFAZOLIN SODIUM-DEXTROSE 2-4 GM/100ML-% IV SOLN
2.0000 g | Freq: Once | INTRAVENOUS | Status: AC
  Administered 2018-02-08: 2 g via INTRAVENOUS
  Filled 2018-02-08: qty 100

## 2018-02-08 MED ORDER — PROPOFOL 10 MG/ML IV BOLUS
INTRAVENOUS | Status: DC | PRN
  Administered 2018-02-08: 100 mg via INTRAVENOUS

## 2018-02-08 MED ORDER — FENTANYL CITRATE (PF) 100 MCG/2ML IJ SOLN
25.0000 ug | INTRAMUSCULAR | Status: DC | PRN
  Administered 2018-02-08: 50 ug via INTRAVENOUS
  Filled 2018-02-08: qty 1

## 2018-02-08 MED ORDER — CEFAZOLIN SODIUM-DEXTROSE 2-4 GM/100ML-% IV SOLN
INTRAVENOUS | Status: AC
  Filled 2018-02-08: qty 100

## 2018-02-08 MED ORDER — LIDOCAINE 2% (20 MG/ML) 5 ML SYRINGE
INTRAMUSCULAR | Status: DC | PRN
  Administered 2018-02-08: 30 mg via INTRAVENOUS

## 2018-02-08 MED ORDER — MEPERIDINE HCL 25 MG/ML IJ SOLN
6.2500 mg | INTRAMUSCULAR | Status: DC | PRN
  Filled 2018-02-08: qty 1

## 2018-02-08 SURGICAL SUPPLY — 28 items
BAG DRAIN URO-CYSTO SKYTR STRL (DRAIN) ×2 IMPLANT
BAG URINE DRAINAGE (UROLOGICAL SUPPLIES) ×2 IMPLANT
BAG URINE LEG 19OZ MD ST LTX (BAG) IMPLANT
CATH FOLEY 2WAY SLVR  5CC 16FR (CATHETERS) ×1
CATH FOLEY 2WAY SLVR  5CC 20FR (CATHETERS)
CATH FOLEY 2WAY SLVR  5CC 22FR (CATHETERS)
CATH FOLEY 2WAY SLVR 5CC 16FR (CATHETERS) ×1 IMPLANT
CATH FOLEY 2WAY SLVR 5CC 20FR (CATHETERS) IMPLANT
CATH FOLEY 2WAY SLVR 5CC 22FR (CATHETERS) IMPLANT
CLOTH BEACON ORANGE TIMEOUT ST (SAFETY) ×2 IMPLANT
DRSG TELFA 3X8 NADH (GAUZE/BANDAGES/DRESSINGS) IMPLANT
ELECT REM PT RETURN 9FT ADLT (ELECTROSURGICAL)
ELECTRODE REM PT RTRN 9FT ADLT (ELECTROSURGICAL) IMPLANT
EVACUATOR MICROVAS BLADDER (UROLOGICAL SUPPLIES) IMPLANT
GLOVE BIO SURGEON STRL SZ 6.5 (GLOVE) ×2 IMPLANT
GLOVE BIO SURGEON STRL SZ7.5 (GLOVE) ×2 IMPLANT
GLOVE BIOGEL PI IND STRL 6.5 (GLOVE) ×1 IMPLANT
GLOVE BIOGEL PI INDICATOR 6.5 (GLOVE) ×1
GOWN STRL REUS W/TWL LRG LVL3 (GOWN DISPOSABLE) ×4 IMPLANT
HOLDER FOLEY CATH W/STRAP (MISCELLANEOUS) ×2 IMPLANT
IV NS IRRIG 3000ML ARTHROMATIC (IV SOLUTION) ×4 IMPLANT
KIT TURNOVER CYSTO (KITS) ×2 IMPLANT
LOOP CUT BIPOLAR 24F LRG (ELECTROSURGICAL) ×2 IMPLANT
MANIFOLD NEPTUNE II (INSTRUMENTS) ×2 IMPLANT
PACK CYSTO (CUSTOM PROCEDURE TRAY) ×2 IMPLANT
PLUG CATH AND CAP STER (CATHETERS) IMPLANT
TUBE CONNECTING 12X1/4 (SUCTIONS) ×2 IMPLANT
TUBING UROLOGY SET (TUBING) ×2 IMPLANT

## 2018-02-08 NOTE — Op Note (Signed)
Preoperative diagnosis: Bladder neoplasm- 2 cm Postoperative diagnosis: Same  Procedure: TURBT 2 to 5 cm, postop instillation of gemcitabine chemotherapy intravesical  Surgeon: Junious Silk  Anesthesia: General  Indication for procedure: 77 year old white female who was undergoing work-up for possible cecal mass and a small right posterior bladder mass was noted.  She was brought today for above procedures.  Findings: On exam under anesthesia the introitus and vagina appeared normal.  No prolapse.  The bladder and urethra were palpably normal.  On cystoscopy there was a superficial sprawling tumor lateral to the right ureteral orifice with papillary appearance.  Description of procedure: After consent was obtained patient brought to the operating room.  After adequate anesthesia she was placed in lithotomy position and prepped and draped in the usual sterile fashion.  A timeout was performed to confirm the patient and procedure.  The resectoscope was passed per urethra with the visual obturator and the bladder inspected.  I was able to see the bladder neck well with a 30 degree lens.  The ureteral orifice ease appeared normal with clear reflux.  I then took the loop and resected the tumor.  Adequate hemostasis was ensured and the resection was complete.  The chips were removed and the bladder filled with fluid.  I noted a simple appearing cyst on the left bladder neck at 3 o'clock position which was resected.  There was not enough tissue to send to pathology for this bladder neck cyst.  Again hemostasis was excellent and the scope was removed.  A 16 French Foley was placed in left to gravity drainage for wake up.  She was awakened taken to recovery room in stable condition.  Postoperative instillation of gemcitabine: Gemcitabine 2000 mg was instilled per urethra into the bladder and left indwelling for 50 minutes postop.  The bladder was then drained.  Complications: None  Blood loss:  Minimal  Specimen to pathology: Right bladder tumor  Drains: 69 [place] Foley-I will leave this in for a few days  Disposition: Patient stable to PACU

## 2018-02-08 NOTE — Anesthesia Preprocedure Evaluation (Signed)
Anesthesia Evaluation  Patient identified by MRN, date of birth, ID band Patient awake    Reviewed: Allergy & Precautions, NPO status , Patient's Chart, lab work & pertinent test results, reviewed documented beta blocker date and time   Airway Mallampati: I  TM Distance: >3 FB Neck ROM: Full    Dental  (+) Lower Dentures, Upper Dentures   Pulmonary COPD, Current Smoker,     + decreased breath sounds(-) wheezing      Cardiovascular + CAD and + Peripheral Vascular Disease  + Valvular Problems/Murmurs MVP  Rhythm:Regular Rate:Normal     Neuro/Psych  Headaches, Anxiety Depression TIA Neuromuscular disease CVA    GI/Hepatic negative GI ROS, Neg liver ROS,   Endo/Other  Hypothyroidism   Renal/GU      Musculoskeletal  (+) Fibromyalgia -  Abdominal Normal abdominal exam  (+)   Peds  Hematology   Anesthesia Other Findings   Reproductive/Obstetrics                            Anesthesia Physical Anesthesia Plan  ASA: III  Anesthesia Plan: General   Post-op Pain Management:    Induction: Intravenous  PONV Risk Score and Plan: 3 and Ondansetron, Dexamethasone and Midazolam  Airway Management Planned: LMA  Additional Equipment: None  Intra-op Plan:   Post-operative Plan: Extubation in OR  Informed Consent: I have reviewed the patients History and Physical, chart, labs and discussed the procedure including the risks, benefits and alternatives for the proposed anesthesia with the patient or authorized representative who has indicated his/her understanding and acceptance.     Plan Discussed with: CRNA  Anesthesia Plan Comments: (ETT if muscle relaxation needed. )        Anesthesia Quick Evaluation

## 2018-02-08 NOTE — Anesthesia Postprocedure Evaluation (Signed)
Anesthesia Post Note  Patient: Ana Santos  Procedure(s) Performed: TRANSURETHRAL RESECTION OF BLADDER TUMOR (TURBT)/ POSTOPERATIVE INSTILLATION OF CHEMO THERAPY (N/A )     Patient location during evaluation: PACU Anesthesia Type: General Level of consciousness: awake and alert Pain management: pain level controlled Vital Signs Assessment: post-procedure vital signs reviewed and stable Respiratory status: spontaneous breathing, nonlabored ventilation, respiratory function stable and patient connected to nasal cannula oxygen Cardiovascular status: blood pressure returned to baseline and stable Postop Assessment: no apparent nausea or vomiting Anesthetic complications: no    Last Vitals:  Vitals:   02/08/18 1145 02/08/18 1200  BP: (!) 149/57 (!) 146/67  Pulse: 62 (!) 59  Resp: 20 (!) 22  Temp:    SpO2: 96% 97%    Last Pain:  Vitals:   02/08/18 1200  TempSrc:   PainSc: 0-No pain                 Effie Berkshire

## 2018-02-08 NOTE — Discharge Instructions (Signed)
Indwelling Urinary Catheter Care, Adult Take good care of your catheter to keep it working and to prevent problems.  Remove the foley - remove the foley Tuesday morning as instructed.   How to wear your catheter Attach your catheter to your leg with tape (adhesive tape) or a leg strap. Make sure it is not too tight. If you use tape, remove any bits of tape that are already on the catheter. How to wear a drainage bag You should have:  A large overnight bag.  A small leg bag.  Overnight Bag You may wear the overnight bag at any time. Always keep the bag below the level of your bladder but off the floor. When you sleep, put a clean plastic bag in a wastebasket. Then hang the bag inside the wastebasket. Leg Bag Never wear the leg bag at night. Always wear the leg bag below your knee. Keep the leg bag secure with a leg strap or tape. How to care for your skin  Clean the skin around the catheter at least once every day.  Shower every day. Do not take baths.  Put creams, lotions, or ointments on your genital area only as told by your doctor.  Do not use powders, sprays, or lotions on your genital area. How to clean your catheter and your skin 1. Wash your hands with soap and water. 2. Wet a washcloth in warm water and gentle (mild) soap. 3. Use the washcloth to clean the skin where the catheter enters your body. Clean downward and wipe away from the catheter in small circles. Do not wipe toward the catheter. 4. Pat the area dry with a clean towel. Make sure to clean off all soap. How to care for your drainage bags Empty your drainage bag when it is ?- full or at least 2-3 times a day. Replace your drainage bag once a month or sooner if it starts to smell bad or look dirty. Do not clean your drainage bag unless told by your doctor. Emptying a drainage bag  Supplies Needed  Rubbing alcohol.  Gauze pad or cotton ball.  Tape or a leg strap.  Steps 1. Wash your hands with soap and  water. 2. Separate (detach) the bag from your leg. 3. Hold the bag over the toilet or a clean container. Keep the bag below your hips and bladder. This stops pee (urine) from going back into the tube. 4. Open the pour spout at the bottom of the bag. 5. Empty the pee into the toilet or container. Do not let the pour spout touch any surface. 6. Put rubbing alcohol on a gauze pad or cotton ball. 7. Use the gauze pad or cotton ball to clean the pour spout. 8. Close the pour spout. 9. Attach the bag to your leg with tape or a leg strap. 10. Wash your hands.  Changing a drainage bag Supplies Needed  Alcohol wipes.  A clean drainage bag.  Adhesive tape or a leg strap.  Steps 1. Wash your hands with soap and water. 2. Separate the dirty bag from your leg. 3. Pinch the rubber catheter with your fingers so that pee does not spill out. 4. Separate the catheter tube from the drainage tube where these tubes connect (at the connection valve). Do not let the tubes touch any surface. 5. Clean the end of the catheter tube with an alcohol wipe. Use a different alcohol wipe to clean the end of the drainage tube. 6. Connect the catheter tube  to the drainage tube of the clean bag. 7. Attach the new bag to the leg with adhesive tape or a leg strap. 8. Wash your hands.  How to prevent infection and other problems  Never pull on your catheter or try to remove it. Pulling can damage tissue in your body.  Always wash your hands before and after touching your catheter.  If a leg strap gets wet, replace it with a dry one.  Drink enough fluids to keep your pee clear or pale yellow, or as told by your doctor.  Do not let the drainage bag or tubing touch the floor.  Wear cotton underwear.  If you are female, wipe from front to back after you poop (have a bowel movement).  Check on the catheter often to make sure it works and the tubing is not twisted. Get help if:  Your pee is cloudy.  Your pee  smells unusually bad.  Your pee is not draining into the bag.  Your tube gets clogged.  Your catheter starts to leak.  Your bladder feels full. Get help right away if:  You have redness, swelling, or pain where the catheter enters your body.  You have fluid, pus, or a bad smell coming from the area where the catheter enters your body.  The area where the catheter enters your body feels warm.  You have a fever.  You have pain in your: ? Stomach (abdomen). ? Legs. ? Lower back. ? Bladder.  You see blood fill the catheter.  Your pee is pink or red.  You feel sick to your stomach (nauseous).  You throw up (vomit).  You have chills.  Your catheter gets pulled out. This information is not intended to replace advice given to you by your health care provider. Make sure you discuss any questions you have with your health care provider. Document Released: 09/23/2012 Document Revised: 04/26/2016 Document Reviewed: 11/11/2013 Elsevier Interactive Patient Education  2018 [employer].   Cystoscopy, Care After Refer to this sheet in the next few weeks. These instructions provide you with information about caring for yourself after your procedure. Your health care provider may also give you more specific instructions. Your treatment has been planned according to current medical practices, but problems sometimes occur. Call your health care provider if you have any problems or questions after your procedure. What can I expect after the procedure? After the procedure, it is common to have:  Mild pain when you urinate. Pain should stop within a few minutes after you urinate. This may last for up to 1 week.  A small amount of blood in your urine for several days.  Feeling like you need to urinate but producing only a small amount of urine.  Follow these instructions at home:  Medicines  Take over-the-counter and prescription medicines only as told by your health care  provider.  If you were prescribed an antibiotic medicine, take it as told by your health care provider. Do not stop taking the antibiotic even if you start to feel better. General instructions   Return to your normal activities as told by your health care provider. Ask your health care provider what activities are safe for you.  Do not drive for 24 hours if you received a sedative.  Watch for any blood in your urine. If the amount of blood in your urine increases, call your health care provider.  Follow instructions from your health care provider about eating or drinking restrictions.  If a  tissue sample was removed for testing (biopsy) during your procedure, it is your responsibility to get your test results. Ask your health care provider or the department performing the test when your results will be ready.  Drink enough fluid to keep your urine clear or pale yellow.  Keep all follow-up visits as told by your health care provider. This is important. Contact a health care provider if:  You have pain that gets worse or does not get better with medicine, especially pain when you urinate.  You have difficulty urinating. Get help right away if:  You have more blood in your urine.  You have blood clots in your urine.  You have abdominal pain.  You have a fever or chills.  You are unable to urinate. This information is not intended to replace advice given to you by your health care provider. Make sure you discuss any questions you have with your health care provider. Document Released: 12/16/2004 Document Revised: 11/04/2015 Document Reviewed: 04/15/2015 Elsevier Interactive Patient Education  2018 [place] [facility] Care Instructions  Activity: Get plenty of rest for the remainder of the day. A responsible individual must stay with you for 24 hours following the procedure.  For the next 24 hours, DO NOT: -Drive a car -Paediatric nurse -Drink alcoholic  beverages -Take any medication unless instructed by your physician -Make any legal decisions or sign important papers.  Meals: Start with liquid foods such as gelatin or soup. Progress to regular foods as tolerated. Avoid greasy, spicy, heavy foods. If nausea and/or vomiting occur, drink only clear liquids until the nausea and/or vomiting subsides. Call your physician if vomiting continues.  Special Instructions/Symptoms: Your throat may feel dry or sore from the anesthesia or the breathing tube placed in your throat during surgery. If this causes discomfort, gargle with warm salt water. The discomfort should disappear within 24 hours.

## 2018-02-08 NOTE — Anesthesia Procedure Notes (Signed)
Procedure Name: LMA Insertion Date/Time: 02/08/2018 7:37 AM Performed by: Bonney Aid, CRNA Pre-anesthesia Checklist: Patient identified, Emergency Drugs available, Suction available and Patient being monitored Patient Re-evaluated:Patient Re-evaluated prior to induction Oxygen Delivery Method: Circle system utilized Preoxygenation: Pre-oxygenation with 100% oxygen Induction Type: IV induction Ventilation: Mask ventilation without difficulty LMA: LMA inserted LMA Size: 3.0 Number of attempts: 1 Airway Equipment and Method: Bite block Placement Confirmation: positive ETCO2 Tube secured with: Tape Dental Injury: Teeth and Oropharynx as per pre-operative assessment

## 2018-02-08 NOTE — Transfer of Care (Signed)
Immediate Anesthesia Transfer of Care Note  Patient: Ana Santos  Procedure(s) Performed: TRANSURETHRAL RESECTION OF BLADDER TUMOR (TURBT)/ POSTOPERATIVE INSTILLATION OF CHEMO THERAPY (N/A )  Patient Location: PACU  Anesthesia Type:General  Level of Consciousness: awake, alert  and oriented  Airway & Oxygen Therapy: Patient Spontanous Breathing and Patient connected to nasal cannula oxygen  Post-op Assessment: Report given to RN  Post vital signs: Reviewed and stable  Last Vitals:  Vitals Value Taken Time  BP 144/59 02/08/2018  8:24 AM  Temp    Pulse 58 02/08/2018  8:25 AM  Resp 16 02/08/2018  8:25 AM  SpO2 100 % 02/08/2018  8:25 AM  Vitals shown include unvalidated device data.  Last Pain:  Vitals:   02/08/18 0611  TempSrc:   PainSc: 0-No pain      Patients Stated Pain Goal: 7("7-8") (70/34/03 5248)  Complications: No apparent anesthesia complications

## 2018-02-08 NOTE — Interval H&P Note (Signed)
History and Physical Interval Note:  02/08/2018 7:29 AM  Ana Santos  has presented today for surgery, with the diagnosis of BLADDER NEOPLASM  The various methods of treatment have been discussed with the patient and family. After consideration of risks, benefits and other options for treatment, the patient has consented to  Procedure(s): TRANSURETHRAL RESECTION OF BLADDER TUMOR (TURBT)/ POSTOPERATIVE INSTILLATION OF CHEMO THERAPY (N/A) as a surgical intervention .  The patient's history has been reviewed, patient examined, no change in status, stable for surgery.  I have reviewed the patient's chart and labs. Discussed possible postop foley or stent. She is well today with no dysuria or fever.  Questions were answered to the patient's satisfaction.     Festus Aloe

## 2018-02-12 ENCOUNTER — Encounter (HOSPITAL_BASED_OUTPATIENT_CLINIC_OR_DEPARTMENT_OTHER): Payer: Self-pay | Admitting: Urology

## 2018-03-07 ENCOUNTER — Ambulatory Visit: Payer: Self-pay | Admitting: General Surgery

## 2018-03-07 DIAGNOSIS — R1031 Right lower quadrant pain: Secondary | ICD-10-CM | POA: Diagnosis not present

## 2018-03-08 ENCOUNTER — Telehealth: Payer: Self-pay | Admitting: Pulmonary Disease

## 2018-03-08 MED ORDER — CLORAZEPATE DIPOTASSIUM 7.5 MG PO TABS: 7.5000 mg | ORAL_TABLET | Freq: Three times a day (TID) | ORAL | 3 refills | Status: DC | PRN

## 2018-03-08 NOTE — Telephone Encounter (Signed)
Dr. Lenna Gilford okay the refills I have [facility] and received conformation. I have called the patient and made her aware and have remind  her that he will be retiring at the ended of the year and she should start  Looking for a PCP.  Nothing further needed at this time.

## 2018-03-08 NOTE — Telephone Encounter (Signed)
I have called the patient she would like an refill on clorazepate.Pharm is [facility] Last Ov 10/23/17 Allergies as of 03/08/2018      Reactions   Atorvastatin Other (See Comments)    Lipitor caused arm pain (3/09)   Hydrocodone-acetaminophen Other (See Comments)   hallucinations   Morphine Other (See Comments)   hallucinations   Nortriptyline    Caused nausea and vomiting and shaking, kept her up all night   Topamax [topiramate] Nausea And Vomiting   *dizziness*      Medication List        Accurate as of 03/08/18  2:24 PM. Always use your most recent med list.          acetaminophen 500 MG tablet Commonly known as:  TYLENOL Take 500 mg by mouth every 6 (six) hours as needed for mild pain.   aspirin 325 MG tablet Take 325 mg by mouth daily.   calcium gluconate 500 MG tablet Take 1 tablet by mouth daily.   cholecalciferol 1000 units tablet Commonly known as:  VITAMIN D Take 1,000 Units by mouth daily.   clopidogrel 75 MG tablet Commonly known as:  PLAVIX TAKE 1 TABLET DAILY   clorazepate 7.5 MG tablet Commonly known as:  TRANXENE Take 1 tablet (7.5 mg total) by mouth 3 (three) times daily as needed for anxiety.   hydroxypropyl methylcellulose / hypromellose 2.5 % ophthalmic solution Commonly known as:  ISOPTO TEARS / GONIOVISC Place 1 drop into both eyes as needed for dry eyes.   levothyroxine 75 MCG tablet Commonly known as:  SYNTHROID, LEVOTHROID TAKE 1 TABLET DAILY BEFORE BREAKFAST   metoprolol tartrate 25 MG tablet Commonly known as:  LOPRESSOR TAKE 1 TABLET TWICE A DAY   ondansetron 4 MG tablet Commonly known as:  ZOFRAN Take 1 tablet (4 mg total) by mouth every 8 (eight) hours as needed for nausea or vomiting.   pramipexole 0.25 MG tablet Commonly known as:  MIRAPEX TAKE 1 TABLET AT BEDTIME FOR RESTLESS LEG SYMPTOMS   pravastatin 40 MG tablet Commonly known as:  PRAVACHOL TAKE 1 TABLET DAILY   traMADol 50 MG tablet Commonly known as:   ULTRAM Take 1 tablet (50 mg total) by mouth 3 (three) times daily as needed for moderate pain.       Dr. Lenna Gilford please advise.

## 2018-03-11 ENCOUNTER — Telehealth: Payer: Self-pay | Admitting: Pulmonary Disease

## 2018-03-11 NOTE — Telephone Encounter (Signed)
Attempted to call patient today regarding concerns of SN retiring in Dec 2019. She is requesting that he refer her to another physician in office. I did not receive an answer at time of call. I have left a voicemail message for pt to return call. X1  SN please advise. Routing message to [facility] for review.  Allergies  Allergen Reactions  . Atorvastatin Other (See Comments)     Lipitor caused arm pain (3/09)  . Hydrocodone-Acetaminophen Other (See Comments)    hallucinations  . Morphine Other (See Comments)    hallucinations  . Nortriptyline     Caused nausea and vomiting and shaking, kept her up all night  . Topamax [Topiramate] Nausea And Vomiting    *dizziness*   Current Outpatient Medications on File Prior to Visit  Medication Sig Dispense Refill  . acetaminophen (TYLENOL) 500 MG tablet Take 500 mg by mouth every 6 (six) hours as needed for mild pain.     Marland Kitchen aspirin 325 MG tablet Take 325 mg by mouth daily.      . cholecalciferol (VITAMIN D) 1000 UNITS tablet Take 1,000 Units by mouth daily.      . clopidogrel (PLAVIX) 75 MG tablet TAKE 1 TABLET DAILY (Patient taking differently: Take 75 mg by mouth daily. ) 90 tablet 1  . clorazepate (TRANXENE) 7.5 MG tablet Take 1 tablet (7.5 mg total) by mouth 3 (three) times daily as needed for anxiety. 90 tablet 3  . hydroxypropyl methylcellulose / hypromellose (ISOPTO TEARS / GONIOVISC) 2.5 % ophthalmic solution Place 1 drop into both eyes 3 (three) times daily as needed for dry eyes.     Marland Kitchen levothyroxine (SYNTHROID, LEVOTHROID) 75 MCG tablet TAKE 1 TABLET DAILY BEFORE BREAKFAST (Patient taking differently: Take 75 mcg by mouth daily before breakfast. ) 90 tablet 2  . metoprolol tartrate (LOPRESSOR) 25 MG tablet TAKE 1 TABLET TWICE A DAY (Patient taking differently: Take 25 mg by mouth 2 (two) times daily. ) 180 tablet 2  . ondansetron (ZOFRAN) 4 MG tablet Take 1 tablet (4 mg total) by mouth every 8 (eight) hours as needed for nausea or vomiting.  (Patient not taking: Reported on 03/11/2018) 30 tablet 0  . pramipexole (MIRAPEX) 0.25 MG tablet TAKE 1 TABLET AT BEDTIME FOR RESTLESS LEG SYMPTOMS (Patient taking differently: Take 0.25 mg by mouth at bedtime. TAKE 1 TABLET AT BEDTIME FOR RESTLESS LEG SYMPTOMS) 90 tablet 2  . pravastatin (PRAVACHOL) 40 MG tablet TAKE 1 TABLET DAILY (Patient taking differently: Take 40 mg by mouth at bedtime. ) 90 tablet 2  . traMADol (ULTRAM) 50 MG tablet Take 1 tablet (50 mg total) by mouth 3 (three) times daily as needed for moderate pain. 90 tablet 1   No current facility-administered medications on file prior to visit.

## 2018-03-11 NOTE — Telephone Encounter (Signed)
SN aware. 

## 2018-03-12 ENCOUNTER — Encounter (HOSPITAL_COMMUNITY): Payer: Self-pay

## 2018-03-12 NOTE — Telephone Encounter (Signed)
Ana Santos was this taken care of?

## 2018-03-12 NOTE — Patient Instructions (Addendum)
Ana Santos  03/12/2018   Your procedure is scheduled on: 03-14-18  Report to [place]  Entrance  Report to admitting at      0530 AM    Call this number if you have problems the morning of surgery [phone]    Remember: [place]. FOLLOW BOWEL PREP PER MD OFFICE  DRINK 2 PRESURGERY ENSURE DRINKS THE NIGHT BEFORE SURGERY AT   1000 PM AND 1 PRESURGERY DRINK THE DAY OF THE PROCEDURE 3 HOURS PRIOR TO SCHEDULED SURGERY. NO SOLIDS AFTER   MIDNIGHT THE DAY PRIOR TO THE SURGERY. NOTHING BY MOUTH EXCEPT CLEAR LIQUIDS UNTIL THREE HOURS PRIOR TO SCHEDULED   SURGERY. PLEASE FINISH PRESURGERY ENSURE DRINK PER SURGEON ORDER 3 HOURS PRIOR TO SCHEDULED SURGERY TIME WHICH   NEEDS TO BE COMPLETED AT ___0430 am______.   BRUSH YOUR TEETH MORNING OF SURGERY AND RINSE YOUR MOUTH OUT, NO CHEWING GUM CANDY OR MINTS.     CLEAR LIQUID DIET   Foods Allowed                                                                     Foods Excluded  Coffee and tea, regular and decaf                             liquids that you cannot  Plain Jell-O in any flavor                                             see through such as: Fruit ices (not with fruit pulp)                                     milk, soups, orange juice  Iced Popsicles                                    All solid food Carbonated beverages, regular and diet                                    Cranberry, grape and apple juices Sports drinks like Gatorade Lightly seasoned clear broth or consume(fat free) Sugar, honey syrup  Sample Menu Breakfast                                Lunch                                     Supper Cranberry juice  Beef broth                            Chicken broth Jell-O                                     Grape juice                           Apple juice Coffee or tea                         Jell-O                                      Popsicle                                                Coffee or tea                        Coffee or tea  _____________________________________________________________________     Take these medicines the morning of surgery with A SIP OF WATER: metoprolol, levothyroxine                                You may not have any metal on your body including hair pins and              piercings  Do not wear jewelry, make-up, lotions, powders or perfumes, deodorant             Do not wear nail polish.  Do not shave  48 hours prior to surgery.         .   Do not bring valuables to the hospital. [place].  Contacts, dentures or bridgework may not be worn into surgery.  Leave suitcase in the car. After surgery it may be brought to your room.                Please read over the following fact sheets you were given: _____________________________________________________________________           [facility] - Preparing for Surgery Before surgery, you can play an important role.  Because skin is not sterile, your skin needs to be as free of germs as possible.  You can reduce the number of germs on your skin by washing with CHG (chlorahexidine gluconate) soap before surgery.  CHG is an antiseptic cleaner which kills germs and bonds with the skin to continue killing germs even after washing. Please DO NOT use if you have an allergy to CHG or antibacterial soaps.  If your skin becomes reddened/irritated stop using the CHG and inform your nurse when you arrive at Short Stay. Do not shave (including legs and underarms) for at least 48 hours prior to the first CHG shower.  You may shave your face/neck. Please follow these instructions carefully:  1.  Shower with CHG Soap the  night before surgery and the  morning of Surgery.  2.  If you choose to wash your hair, wash your hair first as usual with your  normal   shampoo.  3.  After you shampoo, rinse your hair and body thoroughly to remove the  shampoo.                           4.  Use CHG as you would any other liquid soap.  You can apply chg directly  to the skin and wash                       Gently with a scrungie or clean washcloth.  5.  Apply the CHG Soap to your body ONLY FROM THE NECK DOWN.   Do not use on face/ open                           Wound or open sores. Avoid contact with eyes, ears mouth and genitals (private parts).                       Wash face,  Genitals (private parts) with your normal soap.             6.  Wash thoroughly, paying special attention to the area where your surgery  will be performed.  7.  Thoroughly rinse your body with warm water from the neck down.  8.  DO NOT shower/wash with your normal soap after using and rinsing off  the CHG Soap.                9.  Pat yourself dry with a clean towel.            10.  Wear clean pajamas.            11.  Place clean sheets on your bed the night of your first shower and do not  sleep with pets. Day of Surgery : Do not apply any lotions/deodorants the morning of surgery.  Please wear clean clothes to the hospital/surgery center.  FAILURE TO FOLLOW THESE INSTRUCTIONS MAY RESULT IN THE CANCELLATION OF YOUR SURGERY PATIENT SIGNATURE_________________________________  NURSE SIGNATURE__________________________________  ________________________________________________________________________  WHAT IS A BLOOD TRANSFUSION? Blood Transfusion Information  A transfusion is the replacement of blood or some of its parts. Blood is made up of multiple cells which provide different functions.  Red blood cells carry oxygen and are used for blood loss replacement.  White blood cells fight against infection.  Platelets control bleeding.  Plasma helps clot blood.  Other blood products are available for specialized needs, such as hemophilia or other clotting disorders. BEFORE THE  TRANSFUSION  Who gives blood for transfusions?   Healthy volunteers who are fully evaluated to make sure their blood is safe. This is blood bank blood. Transfusion therapy is the safest it has ever been in the practice of medicine. Before blood is taken from a donor, a complete history is taken to make sure that person has no history of diseases nor engages in risky social behavior (examples are intravenous drug use or sexual activity with multiple partners). The donor's travel history is screened to minimize risk of transmitting infections, such as malaria. The donated blood is tested for signs of infectious diseases, such as HIV and hepatitis. The blood is then tested to be sure it is  compatible with you in order to minimize the chance of a transfusion reaction. If you or a relative donates blood, this is often done in anticipation of surgery and is not appropriate for emergency situations. It takes many days to process the donated blood. RISKS AND COMPLICATIONS Although transfusion therapy is very safe and saves many lives, the main dangers of transfusion include:   Getting an infectious disease.  Developing a transfusion reaction. This is an allergic reaction to something in the blood you were given. Every precaution is taken to prevent this. The decision to have a blood transfusion has been considered carefully by your caregiver before blood is given. Blood is not given unless the benefits outweigh the risks. AFTER THE TRANSFUSION  Right after receiving a blood transfusion, you will usually feel much better and more energetic. This is especially true if your red blood cells have gotten low (anemic). The transfusion raises the level of the red blood cells which carry oxygen, and this usually causes an energy increase.  The nurse administering the transfusion will monitor you carefully for complications. HOME CARE INSTRUCTIONS  No special instructions are needed after a transfusion. You may find  your energy is better. Speak with your caregiver about any limitations on activity for underlying diseases you may have. SEEK MEDICAL CARE IF:   Your condition is not improving after your transfusion.  You develop redness or irritation at the intravenous (IV) site. SEEK IMMEDIATE MEDICAL CARE IF:  Any of the following symptoms occur over the next 12 hours:  Shaking chills.  You have a temperature by mouth above 102 F (38.9 C), not controlled by medicine.  Chest, back, or muscle pain.  People around you feel you are not acting correctly or are confused.  Shortness of breath or difficulty breathing.  Dizziness and fainting.  You get a rash or develop hives.  You have a decrease in urine output.  Your urine turns a dark color or changes to pink, red, or brown. Any of the following symptoms occur over the next 10 days:  You have a temperature by mouth above 102 F (38.9 C), not controlled by medicine.  Shortness of breath.  Weakness after normal activity.  The white part of the eye turns yellow (jaundice).  You have a decrease in the amount of urine or are urinating less often.  Your urine turns a dark color or changes to pink, red, or brown. Document Released: 05/26/2000 Document Revised: 08/21/2011 Document Reviewed: 01/13/2008 Exit[employer] Patient Information 2014 [place].  _______________________________________________________________________  Incentive Spirometer  An incentive spirometer is a tool that can help keep your lungs clear and active. This tool measures how well you are filling your lungs with each breath. Taking long deep breaths may help reverse or decrease the chance of developing breathing (pulmonary) problems (especially infection) following:  A long period of time when you are unable to move or be active. BEFORE THE PROCEDURE   If the spirometer includes an indicator to show your best effort, your nurse or respiratory therapist will set it  to a desired goal.  If possible, sit up straight or lean slightly forward. Try not to slouch.  Hold the incentive spirometer in an upright position. INSTRUCTIONS FOR USE  1. Sit on the edge of your bed if possible, or sit up as far as you can in bed or on a chair. 2. Hold the incentive spirometer in an upright position. 3. Breathe out normally. 4. Place the mouthpiece in your mouth  and seal your lips tightly around it. 5. Breathe in slowly and as deeply as possible, raising the piston or the ball toward the top of the column. 6. Hold your breath for 3-5 seconds or for as long as possible. Allow the piston or ball to fall to the bottom of the column. 7. Remove the mouthpiece from your mouth and breathe out normally. 8. Rest for a few seconds and repeat Steps 1 through 7 at least 10 times every 1-2 hours when you are awake. Take your time and take a few normal breaths between deep breaths. 9. The spirometer may include an indicator to show your best effort. Use the indicator as a goal to work toward during each repetition. 10. After each set of 10 deep breaths, practice coughing to be sure your lungs are clear. If you have an incision (the cut made at the time of surgery), support your incision when coughing by placing a pillow or rolled up towels firmly against it. Once you are able to get out of bed, walk around indoors and cough well. You may stop using the incentive spirometer when instructed by your caregiver.  RISKS AND COMPLICATIONS  Take your time so you do not get dizzy or light-headed.  If you are in pain, you may need to take or ask for pain medication before doing incentive spirometry. It is harder to take a deep breath if you are having pain. AFTER USE  Rest and breathe slowly and easily.  It can be helpful to keep track of a log of your progress. Your caregiver can provide you with a simple table to help with this. If you are using the spirometer at home, follow these  instructions: [place] IF:   You are having difficultly using the spirometer.  You have trouble using the spirometer as often as instructed.  Your pain medication is not giving enough relief while using the spirometer.  You develop fever of 100.5 F (38.1 C) or higher. SEEK IMMEDIATE MEDICAL CARE IF:   You cough up bloody sputum that had not been present before.  You develop fever of 102 F (38.9 C) or greater.  You develop worsening pain at or near the incision site. MAKE SURE YOU:   Understand these instructions.  Will watch your condition.  Will get help right away if you are not doing well or get worse. Document Released: 10/09/2006 Document Revised: 08/21/2011 Document Reviewed: 12/10/2006 [facility] Patient Information 2014 [place], [place].   ________________________________________________________________________

## 2018-03-13 ENCOUNTER — Other Ambulatory Visit: Payer: Self-pay

## 2018-03-13 ENCOUNTER — Encounter (HOSPITAL_COMMUNITY)
Admission: RE | Admit: 2018-03-13 | Discharge: 2018-03-13 | Disposition: A | Discharge status: Home / Self Care | Payer: Medicare Other | Source: Ambulatory Visit | Attending: General Surgery | Admitting: General Surgery

## 2018-03-13 ENCOUNTER — Encounter (HOSPITAL_COMMUNITY): Payer: Self-pay

## 2018-03-13 DIAGNOSIS — G40209 Localization-related (focal) (partial) symptomatic epilepsy and epileptic syndromes with complex partial seizures, not intractable, without status epilepticus: Secondary | ICD-10-CM | POA: Diagnosis not present

## 2018-03-13 DIAGNOSIS — M8580 Other specified disorders of bone density and structure, unspecified site: Secondary | ICD-10-CM | POA: Diagnosis not present

## 2018-03-13 DIAGNOSIS — R1031 Right lower quadrant pain: Secondary | ICD-10-CM

## 2018-03-13 DIAGNOSIS — K91840 Postprocedural hemorrhage and hematoma of a digestive system organ or structure following a digestive system procedure: Secondary | ICD-10-CM | POA: Diagnosis not present

## 2018-03-13 DIAGNOSIS — R001 Bradycardia, unspecified: Secondary | ICD-10-CM

## 2018-03-13 DIAGNOSIS — K654 Sclerosing mesenteritis: Secondary | ICD-10-CM | POA: Diagnosis not present

## 2018-03-13 DIAGNOSIS — D62 Acute posthemorrhagic anemia: Secondary | ICD-10-CM | POA: Diagnosis not present

## 2018-03-13 DIAGNOSIS — Z8673 Personal history of transient ischemic attack (TIA), and cerebral infarction without residual deficits: Secondary | ICD-10-CM | POA: Diagnosis not present

## 2018-03-13 DIAGNOSIS — E785 Hyperlipidemia, unspecified: Secondary | ICD-10-CM | POA: Diagnosis not present

## 2018-03-13 DIAGNOSIS — E278 Other specified disorders of adrenal gland: Secondary | ICD-10-CM | POA: Diagnosis not present

## 2018-03-13 DIAGNOSIS — Z01810 Encounter for preprocedural cardiovascular examination: Secondary | ICD-10-CM | POA: Insufficient documentation

## 2018-03-13 DIAGNOSIS — I959 Hypotension, unspecified: Secondary | ICD-10-CM | POA: Diagnosis not present

## 2018-03-13 DIAGNOSIS — Z01812 Encounter for preprocedural laboratory examination: Secondary | ICD-10-CM

## 2018-03-13 DIAGNOSIS — E039 Hypothyroidism, unspecified: Secondary | ICD-10-CM | POA: Diagnosis not present

## 2018-03-13 DIAGNOSIS — F17210 Nicotine dependence, cigarettes, uncomplicated: Secondary | ICD-10-CM | POA: Diagnosis not present

## 2018-03-13 DIAGNOSIS — E875 Hyperkalemia: Secondary | ICD-10-CM | POA: Diagnosis not present

## 2018-03-13 LAB — BASIC METABOLIC PANEL
ANION GAP: 9 (ref 5–15)
BUN: 16 mg/dL (ref 8–23)
CO2: 24 mmol/L (ref 22–32)
Calcium: 9.8 mg/dL (ref 8.9–10.3)
Chloride: 112 mmol/L — ABNORMAL HIGH (ref 98–111)
Creatinine, Ser: 0.93 mg/dL (ref 0.44–1.00)
GFR, EST NON AFRICAN AMERICAN: 58 mL/min — AB (ref >60)
Glucose, Bld: 89 mg/dL (ref 70–99)
POTASSIUM: 4.3 mmol/L (ref 3.5–5.1)
Sodium: 145 mmol/L (ref 135–145)

## 2018-03-13 LAB — CBC
HCT: 41.2 % (ref 36.0–46.0)
HEMOGLOBIN: 12.9 g/dL (ref 12.0–15.0)
MCH: 28.6 pg (ref 26.0–34.0)
MCHC: 31.3 g/dL (ref 30.0–36.0)
MCV: 91.4 fL (ref 78.0–100.0)
Platelets: 300 10*3/uL (ref 150–400)
RBC: 4.51 MIL/uL (ref 3.87–5.11)
RDW: 13.5 % (ref 11.5–15.5)
WBC: 11.1 10*3/uL — AB (ref 4.0–10.5)

## 2018-03-13 LAB — ABO/RH: ABO/RH(D): A POS

## 2018-03-13 MED ORDER — BUPIVACAINE LIPOSOME 1.3 % IJ SUSP
20.0000 mL | Freq: Once | INTRAMUSCULAR | Status: DC
  Filled 2018-03-13: qty 20

## 2018-03-13 NOTE — H&P (Signed)
History of Present Illness Ana Kitchen T. Benji Poynter MD; 03/07/2018 9:44 AM) The patient is a 77 year old female who presents with abdominal pain. She returns for follow-up for her right lower quadrant abdominal pain. The patient is a 77 year old female who presents with abdominal pain. Patient is a pleasant 77 year old female referred by Ana Santos and Ana Santos for persistent right lower quadrant abdominal pain and abnormal CT scan. The patient states she was in her usual state of health until about 2 months ago. At that time she had the gradual onset of right lower quadrant abdominal pain. This has not been very severe but has been quite bothersome. It is worse with any pressure such with close. It waxes and wanes and occasionally gets moderately severe. It is not related to eating. She has had some lack of appetite and has lost a few pounds. No vomiting. Bowel movements have been normal. She had some diarrhea during a course of antibiotics but this resolved afterwards. No melena or hematochezia. She has had urinary frequency but she states this is been present for about 6 months. No hematuria. No fever or chills. She is a current smoker.  She initially had a CT scan showing some thickening of the cecal wall and terminal ileum with possibly some surrounding inflammatory changes. This was favored to be an infectious colitis and less likely ischemic based on the distribution. She was treated with a course of antibiotics, initially Cipro and Flagyl and subsequently Augmentin when the first antibiotics caused diarrhea. This did not affect her pain. She has had a colonoscopy on 1 July. There was a benign polyp in the cecum and a few other benign polyps of the colon but the appendiceal orifice and ileocecal valve and cecum appeared normal. The terminal ileum could not be intubated. She has had a subsequent follow-up CT scan most recently on 9 July which essentially is unchanged showing  interstitial thickening posterior lateral to the ileocecal valve and cecum with subtle cecal wall thickening and mucosal hyper enhancement. Infectious or inflammatory colitis was favored by CT but again her colonoscopy was normal. A new finding however was subtle irregularity of the right bladder wall suspicious for urothelial carcinoma. Urology consult has been arranged for next week. CT also noted a stable adrenal nodule.  Patient now returns after having cystoscopy by Ana Santos. This did show some tumors on the right bladder wall which were biopsied showing noninvasive low-grade papillary carcinoma. Muscularis propria was not involved. This will require further nonsurgical treatment that he did not feel that this could explain her GI findings on CT. Her pain is about the same or possibly a little worse. Her weight has gone up a couple of pounds.   Problem List/Past Medical Ana Kitchen T. [name], MD; 03/07/2018 9:44 AM) RIGHT LOWER QUADRANT ABDOMINAL PAIN (R10.31)   Past Surgical History Ana Kitchen T. [name], MD; 03/07/2018 9:44 AM) Breast Biopsy  Right. Cataract Surgery  Bilateral. Colon Polyp Removal - Open  Hemorrhoidectomy  Hysterectomy (not due to cancer) - Complete   Diagnostic Studies History Ana Kitchen T. [name], MD; 03/07/2018 9:44 AM) Colonoscopy  within last year Pap Smear  never  Allergies (Ana Santos, CMA; 03/07/2018 9:13 AM) Atorvastatin Calcium *CHEMICALS*  Hydrocodone-Acetaminophen *ANALGESICS - OPIOID*  Morphine Sulfate *ANALGESICS - OPIOID*  Nortriptyline HCl *ANTIDEPRESSANTS*  Topamax *ANTICONVULSANTS*   Medication History (Ana Santos, CMA; 03/07/2018 9:14 AM) Tylenol (325MG  Tablet, Oral) Active. Aspirin (325MG  Tablet, Oral) Active. Calcium (500MG  Tablet, Oral) Active. Vitamin D (1000UNIT Tablet, Oral) Active.  Plavix (75MG  Tablet, Oral) Active. Clorazepate Dipotassium (7.5MG  Tablet, Oral) Active. Hydroxypropyl Methylcellulose  (2.5% Solution, Ophthalmic) Active. Levothyroxine Sodium (75MCG Tablet, Oral) Active. Metoprolol Succinate ER (25MG  Tablet ER 24HR, Oral) Active. Pramipexole Dihydrochloride (0.25MG  Tablet, Oral) Active. Pravastatin Sodium (40MG  Tablet, Oral) Active. TraMADol HCl (50MG  Tablet, Oral) Active. Medications Reconciled  Social History Ana Kitchen T. [name], MD; 03/07/2018 9:44 AM) Caffeine use  Coffee, Tea. No alcohol use  Tobacco use  Current every day smoker.  Family History Ana Kitchen T. [name], MD; 03/07/2018 9:44 AM) Depression  Mother. Heart Disease  Mother. Heart disease in female family member before age 20   Pregnancy / Birth History Ana Kitchen T. [name], MD; 03/07/2018 9:44 AM) Age at menarche  67 years. Age of menopause  <45 Gravida  5 Maternal age  42-20 Para  30  Other Problems Ana Kitchen T. [name], MD; 03/07/2018 9:44 AM) Anxiety Disorder  Bladder Problems  Cerebrovascular Accident  Chronic Obstructive Lung Disease  Depression  Hypercholesterolemia  Kidney Stone  Lump In Breast  Seizure Disorder  Thyroid Disease   Vitals (Ana Santos CMA; 03/07/2018 9:14 AM) 03/07/2018 9:14 AM Weight: 117.13 lb Height: 62.5in Body Surface Area: 1.53 m Body Mass Index: 21.08 kg/m  Temp.: 98.38F(Oral)  Pulse: 64 (Regular)  BP: 140/72 (Sitting, Left Arm, Standard)       Physical Exam Ana Kitchen T. [name] MD; 03/07/2018 9:46 AM) The physical exam findings are as follows: Note:General: Alert, thin older female, in no distress Skin: Warm and dry without rash or infection. HEENT: No palpable masses or thyromegaly. Sclera nonicteric. Lymph nodes: No cervical, supraclavicular, or inguinal nodes palpable. Lungs: Breath sounds clear and equal. No wheezing or increased work of breathing. Cardiovascular: Regular rate and rhythm without murmer. No JVD or edema. Peripheral pulses intact. No carotid bruits. Abdomen: Nondistended. There is  moderate tenderness in the right lower quadrant and some fullness to palpation in this area as well. No organomegaly. No palpable hernias. Extremities: No edema or joint swelling or deformity. No chronic venous stasis changes. Neurologic: Alert and fully oriented. Gait normal. No focal weakness. Psychiatric: Normal mood and affect. Thought content appropriate with normal judgement and insight    Assessment & Plan Ana Kitchen T. Riannah Stagner MD; 03/07/2018 9:56 AM) RIGHT LOWER QUADRANT ABDOMINAL PAIN (R10.31) Impression: Pleasant 77 year old female with about 3 months of persistent and slightly progressive right lower quadrant abdominal pain. She has had some lack of appetite and slight weight loss. Her long-standing urinary frequency. Negative colonoscopy. CT shows thickening and hyperenhancement posterior lateral around the cecal wall, terminal ileum and ileocecal valve. No response to antibiotics. Most recent CT also showed apparent bladder tumor confirmed on cystoscopy although biopsy shows no invasion into the bladder wall and this is not felt to be an explanation for her current symptoms. This is of concern for malignancy. Has not responded to antibiotics and colonoscopy did not really show any colitis. Small bowel tumor is a possibility or infiltration from urinary or GYN malignancy, or suppose an unusual presentation of inflammatory bowel disease. I think she needs diagnostic laparoscopy with possible ileocecectomy based on findings. I discussed this with the patient. I will confirm with Dr. Henrene Pastor that he does not feel there is any indication for treating empirically for inflammatory bowel disease and if not I think we need to proceed with diagnostic laparoscopy and possible ileocecectomy. I discussed this with the patient and her husband in detail. Discussed that the diagnosis is very uncertain as our findings at surgery. Discussed risks of general anesthesia, bleeding,  infection, anastomotic leak or  injury to surrounding structure. We will approach this laparoscopically discussed possible need for open surgery. They understand and agree to proceed.  Current Plans  Diagnostic laparoscopy and probable ileocecostomy

## 2018-03-13 NOTE — Progress Notes (Signed)
ekg 02-08-18 epic  cxr 04-25-18 epic

## 2018-03-13 NOTE — Anesthesia Preprocedure Evaluation (Addendum)
Anesthesia Evaluation  Patient identified by MRN, date of birth, ID band Patient awake    Reviewed: Allergy & Precautions, NPO status , Patient's Chart, lab work & pertinent test results, reviewed documented beta blocker date and time   History of Anesthesia Complications Negative for: history of anesthetic complications  Airway Mallampati: II  TM Distance: >3 FB Neck ROM: Full    Dental  (+) Edentulous Upper, Edentulous Lower   Pulmonary Current Smoker,    breath sounds clear to auscultation       Cardiovascular hypertension, Pt. on medications and Pt. on home beta blockers (-) angina Rhythm:Regular Rate:Normal  '16 ECHO: EF 65-70%, valves OK '16 cath: minor luminal irregularities with no evidence of obstructive coronary artery disease. EF normal.   Neuro/Psych Anxiety Depression TIA   GI/Hepatic Neg liver ROS, N/V this am   Endo/Other  Hypothyroidism   Renal/GU negative Renal ROS     Musculoskeletal   Abdominal   Peds  Hematology negative hematology ROS (+)   Anesthesia Other Findings   Reproductive/Obstetrics                            Anesthesia Physical Anesthesia Plan  ASA: III  Anesthesia Plan: General   Post-op Pain Management:    Induction: Intravenous and Rapid sequence  PONV Risk Score and Plan: 3 and Ondansetron, Dexamethasone and Treatment may vary due to age or medical condition  Airway Management Planned: Oral ETT  Additional Equipment:   Intra-op Plan:   Post-operative Plan: Extubation in OR  Informed Consent: I have reviewed the patients History and Physical, chart, labs and discussed the procedure including the risks, benefits and alternatives for the proposed anesthesia with the patient or authorized representative who has indicated his/her understanding and acceptance.     Plan Discussed with: CRNA and Surgeon  Anesthesia Plan Comments: (Plan routine  monitors, GETA)        Anesthesia Quick Evaluation

## 2018-03-13 NOTE — Telephone Encounter (Signed)
SN to call Patient.

## 2018-03-13 NOTE — Progress Notes (Signed)
FYI pt. BP 190/65 right arm and 178/65 left arm at preop. appt surgery scheduled for 03-14-18 Pt. Was asymptomatic but did state she was very nervous about surgery.

## 2018-03-14 ENCOUNTER — Inpatient Hospital Stay (HOSPITAL_COMMUNITY)
Admission: RE | Admit: 2018-03-14 | Discharge: 2018-03-19 | DRG: 330 | Disposition: A | Discharge status: Home / Self Care | Payer: Medicare Other | Attending: General Surgery | Admitting: General Surgery

## 2018-03-14 ENCOUNTER — Inpatient Hospital Stay (HOSPITAL_COMMUNITY): Payer: Medicare Other | Admitting: Anesthesiology

## 2018-03-14 ENCOUNTER — Inpatient Hospital Stay: Payer: Self-pay

## 2018-03-14 ENCOUNTER — Encounter (HOSPITAL_COMMUNITY)
Admission: RE | Disposition: A | Discharge status: Home / Self Care | Payer: Self-pay | Source: Home / Self Care | Attending: General Surgery

## 2018-03-14 ENCOUNTER — Encounter (HOSPITAL_COMMUNITY): Payer: Self-pay

## 2018-03-14 DIAGNOSIS — M8580 Other specified disorders of bone density and structure, unspecified site: Secondary | ICD-10-CM | POA: Diagnosis present

## 2018-03-14 DIAGNOSIS — K91840 Postprocedural hemorrhage and hematoma of a digestive system organ or structure following a digestive system procedure: Secondary | ICD-10-CM | POA: Diagnosis not present

## 2018-03-14 DIAGNOSIS — J449 Chronic obstructive pulmonary disease, unspecified: Secondary | ICD-10-CM | POA: Diagnosis present

## 2018-03-14 DIAGNOSIS — Z8673 Personal history of transient ischemic attack (TIA), and cerebral infarction without residual deficits: Secondary | ICD-10-CM

## 2018-03-14 DIAGNOSIS — Z7901 Long term (current) use of anticoagulants: Secondary | ICD-10-CM

## 2018-03-14 DIAGNOSIS — E785 Hyperlipidemia, unspecified: Secondary | ICD-10-CM | POA: Diagnosis not present

## 2018-03-14 DIAGNOSIS — E559 Vitamin D deficiency, unspecified: Secondary | ICD-10-CM | POA: Diagnosis present

## 2018-03-14 DIAGNOSIS — K639 Disease of intestine, unspecified: Secondary | ICD-10-CM | POA: Diagnosis not present

## 2018-03-14 DIAGNOSIS — E039 Hypothyroidism, unspecified: Secondary | ICD-10-CM | POA: Diagnosis present

## 2018-03-14 DIAGNOSIS — G40209 Localization-related (focal) (partial) symptomatic epilepsy and epileptic syndromes with complex partial seizures, not intractable, without status epilepticus: Secondary | ICD-10-CM | POA: Diagnosis not present

## 2018-03-14 DIAGNOSIS — I10 Essential (primary) hypertension: Secondary | ICD-10-CM | POA: Diagnosis not present

## 2018-03-14 DIAGNOSIS — K660 Peritoneal adhesions (postprocedural) (postinfection): Secondary | ICD-10-CM | POA: Diagnosis not present

## 2018-03-14 DIAGNOSIS — I959 Hypotension, unspecified: Secondary | ICD-10-CM | POA: Diagnosis present

## 2018-03-14 DIAGNOSIS — Z801 Family history of malignant neoplasm of trachea, bronchus and lung: Secondary | ICD-10-CM | POA: Diagnosis not present

## 2018-03-14 DIAGNOSIS — R270 Ataxia, unspecified: Secondary | ICD-10-CM | POA: Diagnosis not present

## 2018-03-14 DIAGNOSIS — K922 Gastrointestinal hemorrhage, unspecified: Secondary | ICD-10-CM

## 2018-03-14 DIAGNOSIS — Z8249 Family history of ischemic heart disease and other diseases of the circulatory system: Secondary | ICD-10-CM | POA: Diagnosis not present

## 2018-03-14 DIAGNOSIS — K6389 Other specified diseases of intestine: Secondary | ICD-10-CM | POA: Diagnosis not present

## 2018-03-14 DIAGNOSIS — R29818 Other symptoms and signs involving the nervous system: Secondary | ICD-10-CM | POA: Diagnosis not present

## 2018-03-14 DIAGNOSIS — Z833 Family history of diabetes mellitus: Secondary | ICD-10-CM

## 2018-03-14 DIAGNOSIS — D120 Benign neoplasm of cecum: Secondary | ICD-10-CM | POA: Diagnosis not present

## 2018-03-14 DIAGNOSIS — Y832 Surgical operation with anastomosis, bypass or graft as the cause of abnormal reaction of the patient, or of later complication, without mention of misadventure at the time of the procedure: Secondary | ICD-10-CM | POA: Diagnosis not present

## 2018-03-14 DIAGNOSIS — Y92239 Unspecified place in hospital as the place of occurrence of the external cause: Secondary | ICD-10-CM | POA: Diagnosis not present

## 2018-03-14 DIAGNOSIS — K654 Sclerosing mesenteritis: Principal | ICD-10-CM | POA: Diagnosis present

## 2018-03-14 DIAGNOSIS — F17210 Nicotine dependence, cigarettes, uncomplicated: Secondary | ICD-10-CM | POA: Diagnosis present

## 2018-03-14 DIAGNOSIS — D494 Neoplasm of unspecified behavior of bladder: Secondary | ICD-10-CM | POA: Diagnosis present

## 2018-03-14 DIAGNOSIS — E875 Hyperkalemia: Secondary | ICD-10-CM | POA: Diagnosis not present

## 2018-03-14 DIAGNOSIS — D62 Acute posthemorrhagic anemia: Secondary | ICD-10-CM | POA: Diagnosis not present

## 2018-03-14 DIAGNOSIS — E278 Other specified disorders of adrenal gland: Secondary | ICD-10-CM | POA: Diagnosis present

## 2018-03-14 DIAGNOSIS — R404 Transient alteration of awareness: Secondary | ICD-10-CM | POA: Diagnosis present

## 2018-03-14 DIAGNOSIS — R569 Unspecified convulsions: Secondary | ICD-10-CM | POA: Diagnosis not present

## 2018-03-14 DIAGNOSIS — R4020 Unspecified coma: Secondary | ICD-10-CM | POA: Diagnosis not present

## 2018-03-14 HISTORY — PX: LAPAROSCOPY: SHX197

## 2018-03-14 HISTORY — PX: LAPAROSCOPIC RIGHT COLECTOMY: SHX5925

## 2018-03-14 LAB — BASIC METABOLIC PANEL
ANION GAP: 5 (ref 5–15)
BUN: 16 mg/dL (ref 8–23)
CALCIUM: 8.4 mg/dL — AB (ref 8.9–10.3)
CHLORIDE: 113 mmol/L — AB (ref 98–111)
CO2: 26 mmol/L (ref 22–32)
Creatinine, Ser: 1.11 mg/dL — ABNORMAL HIGH (ref 0.44–1.00)
GFR calc Af Amer: 54 mL/min — ABNORMAL LOW (ref >60)
GFR calc non Af Amer: 47 mL/min — ABNORMAL LOW (ref >60)
GLUCOSE: 172 mg/dL — AB (ref 70–99)
Potassium: 3.7 mmol/L (ref 3.5–5.1)
Sodium: 144 mmol/L (ref 135–145)

## 2018-03-14 LAB — CBC
HEMATOCRIT: 28.2 % — AB (ref 36.0–46.0)
HEMOGLOBIN: 9.3 g/dL — AB (ref 12.0–15.0)
MCH: 29.3 pg (ref 26.0–34.0)
MCHC: 33 g/dL (ref 30.0–36.0)
MCV: 89 fL (ref 78.0–100.0)
Platelets: 262 10*3/uL (ref 150–400)
RBC: 3.17 MIL/uL — ABNORMAL LOW (ref 3.87–5.11)
RDW: 13.3 % (ref 11.5–15.5)
WBC: 21.8 10*3/uL — AB (ref 4.0–10.5)

## 2018-03-14 LAB — PROTIME-INR
INR: 1.09
Prothrombin Time: 14 seconds (ref 11.4–15.2)

## 2018-03-14 LAB — TROPONIN I: Troponin I: <0.03 ng/mL (ref <0.03)

## 2018-03-14 LAB — APTT: APTT: 24 s (ref 24–36)

## 2018-03-14 LAB — MRSA PCR SCREENING: MRSA by PCR: NEGATIVE

## 2018-03-14 LAB — HEMOGLOBIN: HEMOGLOBIN: 7.5 g/dL — AB (ref 12.0–15.0)

## 2018-03-14 LAB — PREPARE RBC (CROSSMATCH)

## 2018-03-14 SURGERY — Surgical Case
Anesthesia: *Unknown

## 2018-03-14 SURGERY — LAPAROSCOPY, DIAGNOSTIC
Anesthesia: General | Laterality: Right

## 2018-03-14 MED ORDER — LIDOCAINE 2% (20 MG/ML) 5 ML SYRINGE
INTRAMUSCULAR | Status: DC | PRN
  Administered 2018-03-14: 50 mg via INTRAVENOUS

## 2018-03-14 MED ORDER — SUGAMMADEX SODIUM 200 MG/2ML IV SOLN
INTRAVENOUS | Status: AC
  Filled 2018-03-14: qty 2

## 2018-03-14 MED ORDER — OXYCODONE HCL 5 MG PO TABS
5.0000 mg | ORAL_TABLET | ORAL | Status: DC | PRN
  Administered 2018-03-16 – 2018-03-17 (×4): 10 mg via ORAL
  Administered 2018-03-17: 5 mg via ORAL
  Administered 2018-03-18: 10 mg via ORAL
  Filled 2018-03-14 (×6): qty 2

## 2018-03-14 MED ORDER — LACTATED RINGERS IR SOLN
Status: DC | PRN
  Administered 2018-03-14: 1000 mL

## 2018-03-14 MED ORDER — CHLORHEXIDINE GLUCONATE CLOTH 2 % EX PADS
6.0000 | MEDICATED_PAD | Freq: Every day | CUTANEOUS | Status: DC
  Administered 2018-03-15 – 2018-03-19 (×5): 6 via TOPICAL

## 2018-03-14 MED ORDER — EPHEDRINE 5 MG/ML INJ
INTRAVENOUS | Status: AC
  Filled 2018-03-14: qty 10

## 2018-03-14 MED ORDER — METRONIDAZOLE 500 MG PO TABS: 1000.0000 mg | ORAL_TABLET | ORAL | Status: DC

## 2018-03-14 MED ORDER — SODIUM CHLORIDE 0.9% FLUSH
10.0000 mL | INTRAVENOUS | Status: DC | PRN
  Administered 2018-03-18 (×2): 10 mL
  Administered 2018-03-19: 20 mL
  Filled 2018-03-14 (×3): qty 40

## 2018-03-14 MED ORDER — HYDROMORPHONE HCL 1 MG/ML IJ SOLN
0.5000 mg | INTRAMUSCULAR | Status: DC | PRN
  Administered 2018-03-14 – 2018-03-18 (×8): 0.5 mg via INTRAVENOUS
  Filled 2018-03-14 (×6): qty 1
  Filled 2018-03-14: qty 0.5
  Filled 2018-03-14: qty 1

## 2018-03-14 MED ORDER — LIDOCAINE 2% (20 MG/ML) 5 ML SYRINGE
INTRAMUSCULAR | Status: DC | PRN
  Administered 2018-03-14: 1 mg/kg/h via INTRAVENOUS

## 2018-03-14 MED ORDER — SUCCINYLCHOLINE CHLORIDE 200 MG/10ML IV SOSY
PREFILLED_SYRINGE | INTRAVENOUS | Status: DC | PRN
  Administered 2018-03-14: 100 mg via INTRAVENOUS

## 2018-03-14 MED ORDER — BUPIVACAINE LIPOSOME 1.3 % IJ SUSP
INTRAMUSCULAR | Status: DC | PRN
  Administered 2018-03-14: 20 mL

## 2018-03-14 MED ORDER — DEXAMETHASONE SODIUM PHOSPHATE 10 MG/ML IJ SOLN
INTRAMUSCULAR | Status: AC
  Filled 2018-03-14: qty 1

## 2018-03-14 MED ORDER — ALVIMOPAN 12 MG PO CAPS: 12.0000 mg | ORAL_CAPSULE | ORAL | Status: DC

## 2018-03-14 MED ORDER — GLYCOPYRROLATE PF 0.2 MG/ML IJ SOSY
PREFILLED_SYRINGE | INTRAMUSCULAR | Status: DC | PRN
  Administered 2018-03-14: .2 mg via INTRAVENOUS

## 2018-03-14 MED ORDER — PROPOFOL 10 MG/ML IV BOLUS
INTRAVENOUS | Status: DC | PRN
  Administered 2018-03-14: 110 mg via INTRAVENOUS

## 2018-03-14 MED ORDER — LACTATED RINGERS IV SOLN
INTRAVENOUS | Status: DC
  Administered 2018-03-14: 06:00:00 via INTRAVENOUS

## 2018-03-14 MED ORDER — GABAPENTIN 300 MG PO CAPS
300.0000 mg | ORAL_CAPSULE | ORAL | Status: AC
  Administered 2018-03-14: 300 mg via ORAL

## 2018-03-14 MED ORDER — FENTANYL CITRATE (PF) 100 MCG/2ML IJ SOLN
25.0000 ug | INTRAMUSCULAR | Status: DC | PRN
  Administered 2018-03-14 (×2): 50 ug via INTRAVENOUS

## 2018-03-14 MED ORDER — SODIUM CHLORIDE 0.9 % IV SOLN
2.0000 g | INTRAVENOUS | Status: AC
  Administered 2018-03-14: 2 g via INTRAVENOUS

## 2018-03-14 MED ORDER — POTASSIUM CHLORIDE IN NACL 20-0.9 MEQ/L-% IV SOLN
INTRAVENOUS | Status: DC
  Administered 2018-03-14 – 2018-03-15 (×2): via INTRAVENOUS
  Filled 2018-03-14 (×3): qty 1000

## 2018-03-14 MED ORDER — TRAMADOL HCL 50 MG PO TABS: 50.0000 mg | ORAL_TABLET | Freq: Four times a day (QID) | ORAL | Status: DC | PRN

## 2018-03-14 MED ORDER — SODIUM CHLORIDE 0.9% IV SOLUTION: Freq: Once | INTRAVENOUS | Status: DC

## 2018-03-14 MED ORDER — ACETAMINOPHEN 500 MG PO TABS
500.0000 mg | ORAL_TABLET | Freq: Four times a day (QID) | ORAL | Status: DC | PRN
  Administered 2018-03-16: 500 mg via ORAL
  Filled 2018-03-14: qty 1

## 2018-03-14 MED ORDER — ONDANSETRON HCL 4 MG PO TABS: 4.0000 mg | ORAL_TABLET | Freq: Four times a day (QID) | ORAL | Status: DC | PRN

## 2018-03-14 MED ORDER — PRAMIPEXOLE DIHYDROCHLORIDE 0.25 MG PO TABS
0.2500 mg | ORAL_TABLET | Freq: Every day | ORAL | Status: DC
  Administered 2018-03-14 – 2018-03-18 (×5): 0.25 mg via ORAL
  Filled 2018-03-14 (×5): qty 1

## 2018-03-14 MED ORDER — ONDANSETRON HCL 4 MG/2ML IJ SOLN: 4.0000 mg | Freq: Four times a day (QID) | INTRAMUSCULAR | Status: DC | PRN

## 2018-03-14 MED ORDER — ROCURONIUM BROMIDE 10 MG/ML (PF) SYRINGE
PREFILLED_SYRINGE | INTRAVENOUS | Status: AC
  Filled 2018-03-14: qty 10

## 2018-03-14 MED ORDER — GABAPENTIN 300 MG PO CAPS
ORAL_CAPSULE | ORAL | Status: AC
  Filled 2018-03-14: qty 1

## 2018-03-14 MED ORDER — BUPIVACAINE-EPINEPHRINE 0.5% -1:200000 IJ SOLN
INTRAMUSCULAR | Status: DC | PRN
  Administered 2018-03-14: 30 mL

## 2018-03-14 MED ORDER — ONDANSETRON HCL 4 MG/2ML IJ SOLN
INTRAMUSCULAR | Status: AC
  Filled 2018-03-14: qty 2

## 2018-03-14 MED ORDER — LEVOTHYROXINE SODIUM 50 MCG PO TABS
75.0000 ug | ORAL_TABLET | Freq: Every day | ORAL | Status: DC
  Administered 2018-03-15 – 2018-03-19 (×5): 75 ug via ORAL
  Filled 2018-03-14 (×7): qty 1

## 2018-03-14 MED ORDER — CELECOXIB 200 MG PO CAPS
200.0000 mg | ORAL_CAPSULE | ORAL | Status: AC
  Administered 2018-03-14: 200 mg via ORAL
  Filled 2018-03-14: qty 1

## 2018-03-14 MED ORDER — LIDOCAINE 2% (20 MG/ML) 5 ML SYRINGE
INTRAMUSCULAR | Status: AC
  Filled 2018-03-14: qty 5

## 2018-03-14 MED ORDER — SUGAMMADEX SODIUM 200 MG/2ML IV SOLN
INTRAVENOUS | Status: DC | PRN
  Administered 2018-03-14: 120 mg via INTRAVENOUS

## 2018-03-14 MED ORDER — ALVIMOPAN 12 MG PO CAPS
ORAL_CAPSULE | ORAL | Status: AC
  Administered 2018-03-14: 12 mg
  Filled 2018-03-14: qty 1

## 2018-03-14 MED ORDER — DEXAMETHASONE SODIUM PHOSPHATE 10 MG/ML IJ SOLN
INTRAMUSCULAR | Status: DC | PRN
  Administered 2018-03-14: 8 mg via INTRAVENOUS

## 2018-03-14 MED ORDER — GLYCOPYRROLATE PF 0.2 MG/ML IJ SOSY
PREFILLED_SYRINGE | INTRAMUSCULAR | Status: AC
  Filled 2018-03-14: qty 1

## 2018-03-14 MED ORDER — SODIUM CHLORIDE 0.9% FLUSH
10.0000 mL | Freq: Two times a day (BID) | INTRAVENOUS | Status: DC
  Administered 2018-03-14: 20 mL
  Administered 2018-03-15 – 2018-03-18 (×6): 10 mL

## 2018-03-14 MED ORDER — LIDOCAINE HCL 2 % IJ SOLN
INTRAMUSCULAR | Status: AC
  Filled 2018-03-14: qty 20

## 2018-03-14 MED ORDER — ACETAMINOPHEN 500 MG PO TABS
1000.0000 mg | ORAL_TABLET | ORAL | Status: AC
  Administered 2018-03-14: 1000 mg via ORAL

## 2018-03-14 MED ORDER — ONDANSETRON HCL 4 MG/2ML IJ SOLN
INTRAMUSCULAR | Status: DC | PRN
  Administered 2018-03-14: 4 mg via INTRAVENOUS

## 2018-03-14 MED ORDER — FENTANYL CITRATE (PF) 100 MCG/2ML IJ SOLN
INTRAMUSCULAR | Status: DC | PRN
  Administered 2018-03-14 (×5): 50 ug via INTRAVENOUS

## 2018-03-14 MED ORDER — PRAVASTATIN SODIUM 40 MG PO TABS
40.0000 mg | ORAL_TABLET | Freq: Every day | ORAL | Status: DC
  Administered 2018-03-14 – 2018-03-18 (×5): 40 mg via ORAL
  Filled 2018-03-14: qty 1
  Filled 2018-03-14 (×2): qty 2
  Filled 2018-03-14: qty 1
  Filled 2018-03-14: qty 2

## 2018-03-14 MED ORDER — HYPROMELLOSE (GONIOSCOPIC) 2.5 % OP SOLN: 1.0000 [drp] | Freq: Three times a day (TID) | OPHTHALMIC | Status: DC | PRN

## 2018-03-14 MED ORDER — SODIUM CHLORIDE 0.9 % IV BOLUS: 1000.0000 mL | Freq: Once | INTRAVENOUS | Status: AC

## 2018-03-14 MED ORDER — PROPOFOL 10 MG/ML IV BOLUS
INTRAVENOUS | Status: AC
  Filled 2018-03-14: qty 20

## 2018-03-14 MED ORDER — POLYETHYLENE GLYCOL 3350 17 GM/SCOOP PO POWD: 1.0000 | Freq: Once | ORAL | Status: DC

## 2018-03-14 MED ORDER — FENTANYL CITRATE (PF) 100 MCG/2ML IJ SOLN
INTRAMUSCULAR | Status: AC
  Filled 2018-03-14: qty 2

## 2018-03-14 MED ORDER — BUPIVACAINE-EPINEPHRINE (PF) 0.5% -1:200000 IJ SOLN
INTRAMUSCULAR | Status: AC
  Filled 2018-03-14: qty 30

## 2018-03-14 MED ORDER — POLYVINYL ALCOHOL 1.4 % OP SOLN
1.0000 [drp] | Freq: Three times a day (TID) | OPHTHALMIC | Status: DC | PRN
  Filled 2018-03-14: qty 15

## 2018-03-14 MED ORDER — FENTANYL CITRATE (PF) 100 MCG/2ML IJ SOLN
INTRAMUSCULAR | Status: AC
  Filled 2018-03-14: qty 4

## 2018-03-14 MED ORDER — ROCURONIUM BROMIDE 10 MG/ML (PF) SYRINGE
PREFILLED_SYRINGE | INTRAVENOUS | Status: DC | PRN
  Administered 2018-03-14: 40 mg via INTRAVENOUS

## 2018-03-14 MED ORDER — CELECOXIB 200 MG PO CAPS
ORAL_CAPSULE | ORAL | Status: AC
  Filled 2018-03-14: qty 2

## 2018-03-14 MED ORDER — GABAPENTIN 300 MG PO CAPS
300.0000 mg | ORAL_CAPSULE | Freq: Two times a day (BID) | ORAL | Status: DC
  Administered 2018-03-14 – 2018-03-19 (×9): 300 mg via ORAL
  Filled 2018-03-14 (×10): qty 1

## 2018-03-14 MED ORDER — CHLORHEXIDINE GLUCONATE CLOTH 2 % EX PADS: 6.0000 | MEDICATED_PAD | Freq: Once | CUTANEOUS | Status: DC

## 2018-03-14 MED ORDER — ENOXAPARIN SODIUM 40 MG/0.4ML ~~LOC~~ SOLN: 40.0000 mg | SUBCUTANEOUS | Status: DC

## 2018-03-14 MED ORDER — EPHEDRINE SULFATE-NACL 50-0.9 MG/10ML-% IV SOSY
PREFILLED_SYRINGE | INTRAVENOUS | Status: DC | PRN
  Administered 2018-03-14: 5 mg via INTRAVENOUS

## 2018-03-14 MED ORDER — SODIUM CHLORIDE 0.9 % IV SOLN
INTRAVENOUS | Status: AC
  Filled 2018-03-14: qty 2

## 2018-03-14 MED ORDER — ALVIMOPAN 12 MG PO CAPS
12.0000 mg | ORAL_CAPSULE | Freq: Two times a day (BID) | ORAL | Status: DC
  Administered 2018-03-15 – 2018-03-16 (×3): 12 mg via ORAL
  Filled 2018-03-14 (×3): qty 1

## 2018-03-14 MED ORDER — NEOMYCIN SULFATE 500 MG PO TABS: 1000.0000 mg | ORAL_TABLET | ORAL | Status: DC

## 2018-03-14 MED ORDER — CLORAZEPATE DIPOTASSIUM 7.5 MG PO TABS
7.5000 mg | ORAL_TABLET | Freq: Three times a day (TID) | ORAL | Status: DC | PRN
  Administered 2018-03-16: 7.5 mg via ORAL
  Filled 2018-03-14: qty 1

## 2018-03-14 MED ORDER — ACETAMINOPHEN 500 MG PO TABS
ORAL_TABLET | ORAL | Status: AC
  Filled 2018-03-14: qty 2

## 2018-03-14 MED ORDER — METOPROLOL TARTRATE 25 MG PO TABS
25.0000 mg | ORAL_TABLET | Freq: Two times a day (BID) | ORAL | Status: DC
  Administered 2018-03-14 – 2018-03-19 (×10): 25 mg via ORAL
  Filled 2018-03-14 (×10): qty 1

## 2018-03-14 SURGICAL SUPPLY — 76 items
APPLIER CLIP ROT 10 11.4 M/L (STAPLE)
BENZOIN TINCTURE PRP APPL 2/3 (GAUZE/BANDAGES/DRESSINGS) IMPLANT
BLADE EXTENDED COATED 6.5IN (ELECTRODE) IMPLANT
CABLE HIGH FREQUENCY MONO STRZ (ELECTRODE) IMPLANT
CELLS DAT CNTRL 66122 CELL SVR (MISCELLANEOUS) ×2 IMPLANT
CLIP APPLIE ROT 10 11.4 M/L (STAPLE) IMPLANT
COUNTER NEEDLE 20 DBL MAG RED (NEEDLE) ×3 IMPLANT
COVER MAYO STAND STRL (DRAPES) ×9 IMPLANT
COVER SURGICAL LIGHT HANDLE (MISCELLANEOUS) ×3 IMPLANT
DECANTER SPIKE VIAL GLASS SM (MISCELLANEOUS) IMPLANT
DERMABOND ADVANCED (GAUZE/BANDAGES/DRESSINGS)
DERMABOND ADVANCED .7 DNX12 (GAUZE/BANDAGES/DRESSINGS) IMPLANT
DRAIN CHANNEL 19F RND (DRAIN) IMPLANT
DRAPE LAPAROSCOPIC ABDOMINAL (DRAPES) ×3 IMPLANT
DRAPE SURG IRRIG POUCH 19X23 (DRAPES) IMPLANT
DRSG OPSITE POSTOP 4X10 (GAUZE/BANDAGES/DRESSINGS) IMPLANT
DRSG OPSITE POSTOP 4X6 (GAUZE/BANDAGES/DRESSINGS) IMPLANT
DRSG OPSITE POSTOP 4X8 (GAUZE/BANDAGES/DRESSINGS) IMPLANT
ELECT PENCIL ROCKER SW 15FT (MISCELLANEOUS) ×3 IMPLANT
ELECT REM PT RETURN 15FT ADLT (MISCELLANEOUS) ×3 IMPLANT
GAUZE SPONGE 4X4 12PLY STRL (GAUZE/BANDAGES/DRESSINGS) IMPLANT
GLOVE BIOGEL PI IND STRL 7.0 (GLOVE) ×2 IMPLANT
GLOVE BIOGEL PI IND STRL 7.5 (GLOVE) ×4 IMPLANT
GLOVE BIOGEL PI INDICATOR 7.0 (GLOVE) ×1
GLOVE BIOGEL PI INDICATOR 7.5 (GLOVE) ×2
GLOVE ECLIPSE 7.5 STRL STRAW (GLOVE) ×6 IMPLANT
GOWN STRL REUS W/TWL LRG LVL3 (GOWN DISPOSABLE) ×3 IMPLANT
GOWN STRL REUS W/TWL XL LVL3 (GOWN DISPOSABLE) ×12 IMPLANT
IRRIG SUCT STRYKERFLOW 2 WTIP (MISCELLANEOUS)
IRRIGATION SUCT STRKRFLW 2 WTP (MISCELLANEOUS) IMPLANT
KIT BASIN OR (CUSTOM PROCEDURE TRAY) ×3 IMPLANT
LEGGING LITHOTOMY PAIR STRL (DRAPES) IMPLANT
PACK COLON (CUSTOM PROCEDURE TRAY) ×3 IMPLANT
PAD POSITIONING PINK XL (MISCELLANEOUS) IMPLANT
PORT LAP GEL ALEXIS MED 5-9CM (MISCELLANEOUS) IMPLANT
POSITIONER SURGICAL ARM (MISCELLANEOUS) IMPLANT
RELOAD BLUE CHELON 45 (STAPLE) IMPLANT
RELOAD PROXIMATE 75MM BLUE (ENDOMECHANICALS) ×9 IMPLANT
RTRCTR WOUND ALEXIS 18CM MED (MISCELLANEOUS) ×3
SCISSORS LAP 5X35 DISP (ENDOMECHANICALS) ×3 IMPLANT
SET IRRIG TUBING LAPAROSCOPIC (IRRIGATION / IRRIGATOR) ×3 IMPLANT
SHEARS HARMONIC ACE PLUS 36CM (ENDOMECHANICALS) ×3 IMPLANT
SLEEVE ADV FIXATION 5X100MM (TROCAR) ×6 IMPLANT
SOLUTION ANTI FOG 6CC (MISCELLANEOUS) ×3 IMPLANT
STAPLER GUN LINEAR PROX 60 (STAPLE) ×3 IMPLANT
STAPLER PROXIMATE 75MM BLUE (STAPLE) ×3 IMPLANT
STAPLER VISISTAT 35W (STAPLE) IMPLANT
STRIP CLOSURE SKIN 1/2X4 (GAUZE/BANDAGES/DRESSINGS) IMPLANT
SUT PDS AB 0 CT1 36 (SUTURE) ×6 IMPLANT
SUT PDS AB 1 CTX 36 (SUTURE) IMPLANT
SUT PDS AB 1 TP1 96 (SUTURE) IMPLANT
SUT PROLENE 2 0 KS (SUTURE) IMPLANT
SUT PROLENE 3 0 SH 48 (SUTURE) IMPLANT
SUT SILK 2 0 (SUTURE) ×2
SUT SILK 2 0 SH CR/8 (SUTURE) ×6 IMPLANT
SUT SILK 2-0 18XBRD TIE 12 (SUTURE) ×4 IMPLANT
SUT SILK 3 0 (SUTURE) ×1
SUT SILK 3 0 SH CR/8 (SUTURE) ×3 IMPLANT
SUT SILK 3-0 18XBRD TIE 12 (SUTURE) ×2 IMPLANT
SUT VIC AB 4-0 PS2 27 (SUTURE) IMPLANT
SUT VICRYL 0 UR6 27IN ABS (SUTURE) ×3 IMPLANT
SYR 10ML ECCENTRIC (SYRINGE) ×3 IMPLANT
SYS LAPSCP GELPORT 120MM (MISCELLANEOUS)
SYSTEM LAPSCP GELPORT 120MM (MISCELLANEOUS) IMPLANT
TAPE CLOTH 4X10 WHT NS (GAUZE/BANDAGES/DRESSINGS) IMPLANT
TOWEL OR 17X26 10 PK STRL BLUE (TOWEL DISPOSABLE) ×3 IMPLANT
TOWEL OR NON WOVEN STRL DISP B (DISPOSABLE) ×6 IMPLANT
TRAY FOLEY MTR SLVR 16FR STAT (SET/KITS/TRAYS/PACK) ×3 IMPLANT
TRAY LAPAROSCOPIC (CUSTOM PROCEDURE TRAY) IMPLANT
TROCAR ADV FIXATION 12X100MM (TROCAR) IMPLANT
TROCAR ADV FIXATION 5X100MM (TROCAR) IMPLANT
TROCAR BLADELESS OPT 5 100 (ENDOMECHANICALS) ×3 IMPLANT
TROCAR XCEL BLUNT TIP 100MML (ENDOMECHANICALS) ×3 IMPLANT
TROCAR XCEL NON-BLD 11X100MML (ENDOMECHANICALS) IMPLANT
TROCAR XCEL UNIV SLVE 11M 100M (ENDOMECHANICALS) IMPLANT
TUBING INSUF HEATED (TUBING) ×3 IMPLANT

## 2018-03-14 NOTE — Interval H&P Note (Signed)
History and Physical Interval Note:  03/14/2018 7:23 AM  Ana Santos  has presented today for surgery, with the diagnosis of RIGHT LOWER QUADRANT ABDOMINAL PAIN  The various methods of treatment have been discussed with the patient and family. After consideration of risks, benefits and other options for treatment, the patient has consented to  Procedure(s): LAPAROSCOPY (N/A) PROBABLE LAPAROSCOPIC RIGHT COLECTOMY POSSIBLE OPEN (N/A) as a surgical intervention .  The patient's history has been reviewed, patient examined, no change in status, stable for surgery.  I have reviewed the patient's chart and labs.  Questions were answered to the patient's satisfaction.     Darene Lamer Kemonie Cutillo

## 2018-03-14 NOTE — Anesthesia Procedure Notes (Signed)
Procedure Name: Intubation Date/Time: 03/14/2018 7:27 AM Performed by: Victoriano Lain, CRNA Pre-anesthesia Checklist: Patient identified, Emergency Drugs available, Suction available, Patient being monitored and Timeout performed Patient Re-evaluated:Patient Re-evaluated prior to induction Oxygen Delivery Method: Circle system utilized Preoxygenation: Pre-oxygenation with 100% oxygen Induction Type: IV induction, Rapid sequence and Cricoid Pressure applied Laryngoscope Size: Mac and 4 Grade View: Grade I Tube type: Oral Tube size: 7.0 mm Number of attempts: 1 Airway Equipment and Method: Stylet Placement Confirmation: ETT inserted through vocal cords under direct vision,  positive ETCO2 and breath sounds checked- equal and bilateral Secured at: 21 cm Tube secured with: Tape Dental Injury: Teeth and Oropharynx as per pre-operative assessment  Comments: RSI with cricoid pressure by Dr. Glennon Mac due to patient complaint of N/V. Pt last vomited at 0200. Grade 1 view with MAC view. ATOI. BBS= +ETCO2.

## 2018-03-14 NOTE — Progress Notes (Signed)
Patient ID: Ana Santos, female   DOB: 1940/10/02, 77 y.o.   MRN: [id number] Day of Surgery   Subjective: She currently is much more comfortable.  No blood per rectum reported in the last couple of hours.  Objective: Vital signs in last 24 hours: Temp:  [97.5 F (36.4 C)-98.5 F (36.9 C)] 98.5 F (36.9 C) (10/03 1431) Pulse Rate:  [45-87] 55 (10/03 1700) Resp:  [12-26] 18 (10/03 1700) BP: (87-167)/(37-136) 127/37 (10/03 1700) SpO2:  [97 %-100 %] 100 % (10/03 1700) Weight:  [50.8 kg] 50.8 kg (10/03 0540)    Intake/Output from previous day: No intake/output data recorded. Intake/Output this shift: Total I/O In: 2786 [I.V.:2686; IV Piggyback:100] Out: 420 [Urine:395; Blood:25]  General appearance: alert, cooperative and no distress GI: Mild diffuse tenderness and nondistended  Lab Results:  Recent Labs    03/13/18 0925 03/14/18 1554  WBC 11.1* 21.8*  HGB 12.9 9.3*  HCT 41.2 28.2*  PLT 300 262   BMET Recent Labs    03/13/18 0925 03/14/18 1554  NA 145 144  K 4.3 3.7  CL 112* 113*  CO2 24 26  GLUCOSE 89 172*  BUN 16 16  CREATININE 0.93 1.11*  CALCIUM 9.8 8.4*     Studies/Results: [place] Ekg Site Rite  Result Date: 03/14/2018 If Site Rite image not attached, placement could not be confirmed due to current cardiac rhythm.   Anti-infectives: Anti-infectives (From admission, onward)   Start     Dose/Rate Route Frequency Ordered Stop   03/14/18 1400  neomycin (MYCIFRADIN) tablet 1,000 mg  Status:  Discontinued     1,000 mg Oral 3 times per day 03/14/18 0538 03/14/18 0541   03/14/18 1400  metroNIDAZOLE (FLAGYL) tablet 1,000 mg  Status:  Discontinued     1,000 mg Oral 3 times per day 03/14/18 0538 03/14/18 0541   03/14/18 0600  cefoTEtan (CEFOTAN) 2 g in sodium chloride 0.9 % 100 mL IVPB     2 g 200 mL/hr over 30 Minutes Intravenous On call to O.R. 03/14/18 4098 03/14/18 0758   03/14/18 0540  sodium chloride 0.9 % with cefoTEtan (CEFOTAN) ADS Med    Note to  Pharmacy:  Waldron Session   : cabinet override      03/14/18 0540 03/14/18 0728      Assessment/Plan: s/p Procedure(s): LAPAROSCOPY LAPAROSCOPIC ASSISTED  RIGHT COLECTOMY Postop staple line bleed.  Currently appears stable.  Observe closely for now.  Hold any anticoagulants.   LOS: 0 days    Edward Jolly 03/14/2018

## 2018-03-14 NOTE — Progress Notes (Signed)
Peripherally Inserted Central Catheter/Midline Placement  The IV Nurse has discussed with the patient and/or persons authorized to consent for the patient, the purpose of this procedure and the potential benefits and risks involved with this procedure.  The benefits include less needle sticks, lab draws from the catheter, and the patient may be discharged home with the catheter. Risks include, but not limited to, infection, bleeding, blood clot (thrombus formation), and puncture of an artery; nerve damage and irregular heartbeat and possibility to perform a PICC exchange if needed/ordered by physician.  Alternatives to this procedure were also discussed.  Bard Power PICC patient education guide, fact sheet on infection prevention and patient information card has been provided to patient /or left at bedside.    PICC/Midline Placement Documentation  PICC Double Lumen 03/14/18 PICC Right Brachial 34 cm 0 cm (Active)  Indication for Insertion or Continuance of Line Poor Vasculature-patient has had multiple peripheral attempts or PIVs lasting less than 24 hours 03/14/2018  9:34 PM  Exposed Catheter (cm) 0 cm 03/14/2018  9:34 PM  Site Assessment Clean;Dry;Intact 03/14/2018  9:34 PM  Lumen #1 Status Flushed;Saline locked;Blood return noted 03/14/2018  9:34 PM  Lumen #2 Status Flushed;Saline locked;Blood return noted 03/14/2018  9:34 PM  Dressing Type Transparent 03/14/2018  9:34 PM  Dressing Status Clean;Dry;Intact;Antimicrobial disc in place 03/14/2018  9:34 PM  Dressing Intervention New dressing 03/14/2018  9:34 PM  Dressing Change Due 03/21/18 03/14/2018  9:34 PM       Jaliya Siegmann, Nicolette Bang 03/14/2018, 9:35 PM

## 2018-03-14 NOTE — Progress Notes (Signed)
Noted large amount of bleeding with clots, unsure if from vagina or rectum.  Blood pressure 87/58, pulse 45.  Paged [facility] with return call from Dr. Excell Seltzer, with orders to give 1 litre NS bolus and transfer to ICU.  Rapid response at bedside, patient continues to bleed, c/o abdominal pain.  Blood pressure 93/54.  Surgical PA at bedside, pt transferred to ICU

## 2018-03-14 NOTE — Op Note (Signed)
Preoperative Diagnosis: RIGHT LOWER QUADRANT ABDOMINAL PAIN  Postoprative Diagnosis: Cecal mass, probably inflammatory, question cecal diverticulitis  Procedure: Procedure(s): Laparoscopic ileocecectomy   Surgeon: Excell Seltzer T   Assistants: Leighton Ruff  Anesthesia:  General endotracheal anesthesia  Indications: Pleasant 77 year old female with about 3 months of persistent and slightly progressive right lower quadrant abdominal pain. She has had some lack of appetite and slight weight loss. Her long-standing urinary frequency. Negative colonoscopy. CT shows thickening and hyperenhancement posterior lateral around the cecal wall, terminal ileum and ileocecal valve. No response to antibiotics. Most recent CT also showed apparent bladder tumor confirmed on cystoscopy although biopsy shows no invasion into the bladder wall and this is not felt to be an explanation for her current symptoms. This is of concern for malignancy. Has not responded to antibiotics and colonoscopy did not really show any colitis. Small bowel tumor is a possibility or infiltration from urinary or GYN malignancy, or suppose an unusual presentation of inflammatory bowel disease  With this presentation I recommended proceeding with laparoscopy and likely ileocecectomy.  The indications and nature of the procedure, expected recovery and risks have been discussed extensively with the patient and her family detailed elsewhere.    Procedure Detail: Patient underwent a mechanical antibiotic bowel prep the day before surgery.  She was brought to the operating room, placed in the supine position on the operating table, and general endotracheal anesthesia induced.  Foley catheter and orogastric tube were placed.  The abdomen was widely sterilely prepped and draped.  Patient timeout was performed and correct procedure verified.  Access was obtained with a 1 cm infra umbilical incision through mattress suture of 0 Vicryl with the  assigned trocar and pneumoperitoneum established.  Under direct vision 5 mm trochars were placed just above the medial to the left iliac crest and in the left mid abdomen.  There were omental adhesions to the anterior abdominal wall from previous hysterectomy that were taken down with the harmonic scalpel.  At this point we identified the terminal ileum and cecum.  There was a fairly large firm mass in the cecum with a dense adherence to the anterior and lateral abdominal wall which appeared inflammatory.  The terminal ileum appeared normal.  The more distal right colon and transverse colon were unremarkable.  No other abnormality seen.  This appeared most consistent with a cecal diverticulitis or chronic ruptured appendicitis.  Following this the cecum was mobilized dividing fairly extensive inflammatory attachments with Harmonic Scalpel and then as it was further mobilized lateral peritoneal attachments of the terminal ileum and cecum and right colon were divided and the cecum freed and the mesentery of the terminal ileum cecum mobilized up out of the retroperitoneum.  The mobilization continued proximally along the proximal right colon and the hepatic flexure was mobilized dividing the hepatocolic ligament.  After full mobilization of the right colon and terminal ileum we desufflated and extended the incision a few centimeters up above the umbilicus and the wound protector placed.  The specimen was bulky and initially had difficulty getting this through the incision which was then extended about a centimeter and we are able to easily eviscerate the cecal mass terminal ileum and right colon up to the proximal transverse colon.  Again appeared most consistent with an inflammatory mass.  Once for proximal distal resection were chosen in the mid right colon and terminal ileum and these were cleaned of mesentery and divided with the GIA 75 mm stapler.  The mesentery of the involved  segment was then divided between  [facility] clamps and tied with 2 oh silks and the specimen removed.  As we prepared to do the anastomosis the colonic and still had some omental adhesions over it without a good clear area of bowel wall for anastomosis and so we resected another 5 cm or so of right colon up to the hepatic flexure which was soft with nice exposed bowel wall for anastomosis.  This was done with the GIA-75 and the small area mesentery divided with the harmonic scalpel.  Following this a functional end-to-end anastomosis was created between the terminal ileum and the right colon with a single firing of the GIA 75 mm stapler.  The common enterotomy was closed with a TA 60 stapler and oversewn with 2 oh silks.  The cross of the anastomosis was reinforced.  This appeared widely patent and was airtight to pressure and no bleeding.  Following this the viscera were returned to the abdomen.  There was no evidence of intra-abdominal bleeding.  All drapes gowns gloves and instruments were changed and patient redraped.  The wound was infiltrated with dilute Exparel  and Marcaine. Fascia was closed with running 0 PDS begun at either end of the incision and tied centrally.  Subtenons tissue was irrigated and skin closed with staples.  Sponge needle and instrument counts were correct.    Findings: As above  Estimated Blood Loss:  less than 50 mL         Drains: None  Blood Given: none          Specimens: Terminal ileum and proximal right colon        Complications:  * No complications entered in OR log *         Disposition: PACU - hemodynamically stable.         Condition: stable

## 2018-03-14 NOTE — Anesthesia Postprocedure Evaluation (Signed)
Anesthesia Post Note  Patient: ELKE HOLTRY  Procedure(s) Performed: LAPAROSCOPY (N/A ) LAPAROSCOPIC ASSISTED  RIGHT COLECTOMY (Right )     Patient location during evaluation: PACU Anesthesia Type: General Level of consciousness: awake and alert, oriented and patient cooperative Pain management: pain level controlled Vital Signs Assessment: post-procedure vital signs reviewed and stable Respiratory status: spontaneous breathing, nonlabored ventilation, respiratory function stable and patient connected to nasal cannula oxygen Cardiovascular status: blood pressure returned to baseline and stable Postop Assessment: no apparent nausea or vomiting Anesthetic complications: no    Last Vitals:  Vitals:   03/14/18 1500 03/14/18 1515  BP: (!) 87/58 (!) 93/54  Pulse: (!) 45   Resp:    Temp:    SpO2: 100%     Last Pain:  Vitals:   03/14/18 1431  TempSrc: Oral  PainSc:                  Tristian Sickinger,E. Rosha Cocker

## 2018-03-14 NOTE — Progress Notes (Signed)
Patient started passing a large amount of clot from her rectum, and dropped her pressure down in the 80s.  Rapid response called and started on a fluid bolus.  Currently she is hemodynamically stable.  Transferred to the ICU.  Labs have been obtained.  She complained of some chest pain but currently she is in a sinus bradycardia ventricular rate is 46, PR intervals 144 ms, QTC 467ms a few PVCs.  I do not see any acute changes from her prior EKG. VERY difficult to stick, we will ask for PICC line.  Dr. Excell Seltzer has seen and talked with her husband.  She is stable, labs are pending.

## 2018-03-14 NOTE — Transfer of Care (Signed)
Immediate Anesthesia Transfer of Care Note  Patient: Ana Santos  Procedure(s) Performed: LAPAROSCOPY (N/A ) LAPAROSCOPIC ASSISTED  RIGHT COLECTOMY (Right )  Patient Location: PACU  Anesthesia Type:General  Level of Consciousness: awake, alert , oriented and patient cooperative  Airway & Oxygen Therapy: Patient Spontanous Breathing and Patient connected to face mask oxygen  Post-op Assessment: Report given to RN, Post -op Vital signs reviewed and stable and Patient moving all extremities  Post vital signs: Reviewed and stable  Last Vitals:  Vitals Value Taken Time  BP    Temp    Pulse 65 03/14/2018  9:47 AM  Resp 16 03/14/2018  9:47 AM  SpO2 100 % 03/14/2018  9:47 AM  Vitals shown include unvalidated device data.  Last Pain:  Vitals:   03/14/18 0556  PainSc: 0-No pain         Complications: No apparent anesthesia complications

## 2018-03-15 ENCOUNTER — Encounter (HOSPITAL_COMMUNITY): Payer: Self-pay | Admitting: General Surgery

## 2018-03-15 ENCOUNTER — Inpatient Hospital Stay (HOSPITAL_COMMUNITY): Payer: Medicare Other

## 2018-03-15 ENCOUNTER — Inpatient Hospital Stay (HOSPITAL_COMMUNITY)
Admission: RE | Admit: 2018-03-15 | Discharge: 2018-03-15 | Disposition: A | Discharge status: Home / Self Care | Payer: Medicare Other | Source: Ambulatory Visit | Attending: General Surgery | Admitting: General Surgery

## 2018-03-15 DIAGNOSIS — K6389 Other specified diseases of intestine: Secondary | ICD-10-CM

## 2018-03-15 DIAGNOSIS — R404 Transient alteration of awareness: Secondary | ICD-10-CM

## 2018-03-15 LAB — CBC
HEMATOCRIT: 21.9 % — AB (ref 36.0–46.0)
HEMOGLOBIN: 7.1 g/dL — AB (ref 12.0–15.0)
MCH: 29.1 pg (ref 26.0–34.0)
MCHC: 32.4 g/dL (ref 30.0–36.0)
MCV: 89.8 fL (ref 78.0–100.0)
Platelets: 192 10*3/uL (ref 150–400)
RBC: 2.44 MIL/uL — ABNORMAL LOW (ref 3.87–5.11)
RDW: 13.8 % (ref 11.5–15.5)
WBC: 13.8 10*3/uL — AB (ref 4.0–10.5)

## 2018-03-15 LAB — HEMOGLOBIN AND HEMATOCRIT, BLOOD
HCT: 24.7 % — ABNORMAL LOW (ref 36.0–46.0)
HEMATOCRIT: 22.1 % — AB (ref 36.0–46.0)
HEMOGLOBIN: 7.1 g/dL — AB (ref 12.0–15.0)
Hemoglobin: 8 g/dL — ABNORMAL LOW (ref 12.0–15.0)

## 2018-03-15 LAB — BASIC METABOLIC PANEL
ANION GAP: 5 (ref 5–15)
BUN: 16 mg/dL (ref 8–23)
CHLORIDE: 119 mmol/L — AB (ref 98–111)
CO2: 20 mmol/L — ABNORMAL LOW (ref 22–32)
Calcium: 7.5 mg/dL — ABNORMAL LOW (ref 8.9–10.3)
Creatinine, Ser: 0.8 mg/dL (ref 0.44–1.00)
GFR calc Af Amer: >60 mL/min (ref >60)
Glucose, Bld: 93 mg/dL (ref 70–99)
POTASSIUM: 5.9 mmol/L — AB (ref 3.5–5.1)
SODIUM: 144 mmol/L (ref 135–145)

## 2018-03-15 LAB — ALBUMIN: ALBUMIN: 2.5 g/dL — AB (ref 3.5–5.0)

## 2018-03-15 LAB — PREPARE RBC (CROSSMATCH)

## 2018-03-15 LAB — POTASSIUM: POTASSIUM: 3.8 mmol/L (ref 3.5–5.1)

## 2018-03-15 MED ORDER — SODIUM CHLORIDE 0.9% IV SOLUTION
Freq: Once | INTRAVENOUS | Status: AC
  Administered 2018-03-15: 13:00:00 via INTRAVENOUS

## 2018-03-15 MED ORDER — SODIUM CHLORIDE 0.9 % IV SOLN
INTRAVENOUS | Status: DC
  Administered 2018-03-15 – 2018-03-18 (×5): via INTRAVENOUS

## 2018-03-15 MED ORDER — ORAL CARE MOUTH RINSE
15.0000 mL | Freq: Two times a day (BID) | OROMUCOSAL | Status: DC
  Administered 2018-03-15 – 2018-03-19 (×7): 15 mL via OROMUCOSAL

## 2018-03-15 MED ORDER — SODIUM CHLORIDE 0.9 % IV SOLN
INTRAVENOUS | Status: DC
  Administered 2018-03-15 – 2018-03-17 (×3): via INTRAVENOUS

## 2018-03-15 NOTE — Progress Notes (Signed)
Patient ID: Ana Santos, female   DOB: 1940-07-31, 77 y.o.   MRN: [id number] 1 Day Post-Op   Subjective: No further bleeding overnight.  Earlier this morning while conversing with her family she suddenly became unresponsive with her eyes open but clammy and would not speak or move.  Code stroke was called.  CT scan has been obtained that was negative.  On returning from the scanner she is again alert and conversant.  She has no recollection of the incident.  She denies any headache or numbness or weakness.  Objective: Vital signs in last 24 hours: Temp:  [97.6 F (36.4 C)-98.8 F (37.1 C)] 97.9 F (36.6 C) (10/04 0800) Pulse Rate:  [45-93] 64 (10/04 1025) Resp:  [12-26] 14 (10/04 1025) BP: (87-173)/(34-113) 173/59 (10/04 1010) SpO2:  [94 %-100 %] 95 % (10/04 1025) Last BM Date: 03/14/18  Intake/Output from previous day: 10/03 0701 - 10/04 0700 In: 3796.8 [I.V.:3696.8; IV Piggyback:100] Out: 970 [Urine:945; Blood:25] Intake/Output this shift: No intake/output data recorded.  General appearance: alert, cooperative and no distress Resp: clear to auscultation bilaterally GI: Nondistended.  Moderate right-sided tenderness.  Left side relatively nontender. Incision/Wound: Mild old bloody drainage.  Lab Results:  Recent Labs    03/14/18 1554 03/14/18 2200 03/15/18 0628  WBC 21.8*  --  13.8*  HGB 9.3* 7.5* 7.1*  HCT 28.2*  --  21.9*  PLT 262  --  192   BMET Recent Labs    03/14/18 1554 03/15/18 0628  NA 144 144  K 3.7 5.9*  CL 113* 119*  CO2 26 20*  GLUCOSE 172* 93  BUN 16 16  CREATININE 1.11* 0.80  CALCIUM 8.4* 7.5*     Studies/Results: Ct Head Code Stroke Wo Contrast  Result Date: 03/15/2018 CLINICAL DATA:  Code stroke. Ataxia, stroke suspected. No asymmetric weakness per report EXAM: CT HEAD WITHOUT CONTRAST TECHNIQUE: Contiguous axial images were obtained from the base of the skull through the vertex without intravenous contrast. COMPARISON:  03/14/2015  brain MRI FINDINGS: Brain: Small remote right cerebellar infarcts. No acute infarct, hemorrhage, hydrocephalus, or collection. Vascular: No hyperdense vessel. There is atherosclerotic calcification. Changes of stent assisted basilar tip aneurysm coiling. Skull: Right wall up mastoidectomy, reportedly for transmastoid schwannoma resection. The bony defect is soft tissue density. Sinuses/Orbits: No acute finding Other: A page was placed 03/15/2018 at 10:38 am to Dr. Excell Seltzer. ASPECTS [facility] Stroke Program Early CT Score) Not scored with this history IMPRESSION: 1. No acute finding. 2. Stent assisted basilar tip aneurysm coiling. 3. Small remote right cerebellar infarcts. Electronically Signed   By: Monte Fantasia M.D.   On: 03/15/2018 10:39   [place] Ekg Site Rite  Result Date: 03/14/2018 If Site Rite image not attached, placement could not be confirmed due to current cardiac rhythm.   Anti-infectives: Anti-infectives (From admission, onward)   Start     Dose/Rate Route Frequency Ordered Stop   03/14/18 1400  neomycin (MYCIFRADIN) tablet 1,000 mg  Status:  Discontinued     1,000 mg Oral 3 times per day 03/14/18 0538 03/14/18 0541   03/14/18 1400  metroNIDAZOLE (FLAGYL) tablet 1,000 mg  Status:  Discontinued     1,000 mg Oral 3 times per day 03/14/18 0538 03/14/18 0541   03/14/18 0600  cefoTEtan (CEFOTAN) 2 g in sodium chloride 0.9 % 100 mL IVPB     2 g 200 mL/hr over 30 Minutes Intravenous On call to O.R. 03/14/18 2119 03/14/18 0758   03/14/18 0540  sodium chloride  0.9 % with cefoTEtan (CEFOTAN) ADS Med    Note to Pharmacy:  Waldron Session   : cabinet override      03/14/18 0540 03/14/18 0728      Assessment/Plan: s/p Procedure(s): LAPAROSCOPY LAPAROSCOPIC ASSISTED  RIGHT COLECTOMY Postoperative apparent staple line bleed with GI bleeding.  Clinically this is stopped with no further bloody bowel movements last night or this morning.  Hemoglobin down to 7.1.  Plan to transfuse 1 unit  PRBCs.  Follow H&H. Episode of unresponsiveness today.  History of cerebral stent and apparent TIAs.  CT was negative and currently she appears neurologically intact.  We have asked neurology to consult. Hyperkalemia.  Potassium now out of IV fluids.  Recheck next blood draw.   LOS: 1 day    Edward Jolly 03/15/2018

## 2018-03-15 NOTE — Progress Notes (Signed)
Informed MD of BP @ bedside.

## 2018-03-15 NOTE — Procedures (Signed)
History: 77 year old female being evaluated for possible seizure  Sedation: None  Technique: This is a 21 channel routine scalp EEG performed at the bedside with bipolar and monopolar montages arranged in accordance to the international 10/20 system of electrode placement. One channel was dedicated to EKG recording.    Background: The background consists of intermixed alpha and beta activities. There is a well defined posterior dominant rhythm of 9 Hz that attenuates with eye opening. Sleep is recorded with normal appearing structures.   Photic stimulation: Physiologic driving is not performed  EEG Abnormalities: None  Clinical Interpretation: This normal EEG is recorded in the waking and sleep state. There was no seizure or seizure predisposition recorded on this study. Please note that a normal EEG does not preclude the possibility of epilepsy.   Ana Rack, MD [facility] [phone]  If 7pm- 7am, please page neurology on call as listed in [place].

## 2018-03-15 NOTE — Progress Notes (Signed)
Patient's husband alerted staff to new onset unresponsiveness with eyes open. Upon entering room patient staring blankly at right side of bed and not following any verbal commands or answering questions. Periods were brief but patient continued to go in and out of conciousness while in room. When coming out of blank stare patient was panicked trying to get out of bed and reaching for family that was not around. Code stroke activated, reports that patient has had past TIA, brain aneurysm, and seizures in the past. CT obtained and teleneuro utilized for rapid care. Husband and family at bedside panicked.

## 2018-03-15 NOTE — Plan of Care (Signed)
  Problem: Pain Managment: Goal: General experience of comfort will improve Outcome: Progressing   

## 2018-03-15 NOTE — Consult Note (Signed)
Neurology Consultation Reason for Consult: Transient unresponsiveness Referring Physician: Hoxworth, B  CC: Transient unresponsiveness  History is obtained from: Patient  HPI: Ana Santos is a 77 y.o. female with a history of TIA, stroke who was recovering from a recent surgery this morning when she had sudden onset unresponsiveness.  She has been admitted for colectomy for concern for malignancy.  Yesterday she had significant passage of blood per rectum and is scheduled to get transfused today.  This morning she states that she was talking to her husband, and then suddenly she has appeared of time that she does not remember.  The next thing she knows she was in [place] getting a CT scan.  It is unclear to me to what degree she was postictal.  She had a rapid return to baseline.  A code stroke was activated and she was evaluated by tele-neurology who felt that seizure was more likely than stroke.  She was not given IV TPA given her return to baseline.   ROS: A 14 point ROS was performed and is negative except as noted in the HPI.  Past Medical History:  Diagnosis Date  . AN (acoustic neuroma) ([place])   . Aneurysm ([place])   . Anxiety   . Cataract   . Cerebrovascular disease   . Cigarette smoker   . COPD (chronic obstructive pulmonary disease) ([facility])    mild, no inhalers or oxygen used  . Coronary artery disease    no current cardiologist  pt. denies  . Deafness in right ear   . Depression   . Headache    tension headaches due to aneurysm repair with stent  . Heart murmur   . History of kidney stones    15 to 20 yrs ago  . Hyperlipidemia   . Hypothyroidism   . IBS (irritable bowel syndrome)   . Mitral valve prolapse    mild takes beta blocker  . Osteopenia   . Palpitations   . Stroke ([place])    x 3 ministrokes last one feb 2014  . Venous insufficiency   . Vitamin D deficiency      Family History  Problem Relation Age of Onset  . Heart attack Mother   . Diabetes  Mother   . Cancer Father   . Diabetes Sister   . Diabetes Brother   . Cancer Sister        unkown type  . Lung cancer Brother        Lung  . AAA (abdominal aortic aneurysm) Neg Hx   . Colon cancer Neg Hx   . Stomach cancer Neg Hx   . Esophageal cancer Neg Hx   . Liver cancer Neg Hx   . Pancreatic cancer Neg Hx   . Rectal cancer Neg Hx      Social History:  reports that she has been smoking cigarettes. She has a 12.50 pack-year smoking history. She has never used smokeless tobacco. She reports that she does not drink alcohol or use drugs.   Exam: Current vital signs: BP (!) 136/45 (BP Location: Left Arm)   Pulse 67   Temp 98.2 F (36.8 C) (Oral)   Resp 19   Ht 5\' 2"  (1.575 m)   Wt 50.8 kg   SpO2 96%   BMI 20.49 kg/m  Vital signs in last 24 hours: Temp:  [97.9 F (36.6 C)-98.8 F (37.1 C)] 98.2 F (36.8 C) (10/04 1305) Pulse Rate:  [45-93] 67 (10/04 1305) Resp:  [12-26] 19 (  10/04 1305) BP: (87-173)/(34-113) 136/45 (10/04 1305) SpO2:  [94 %-100 %] 96 % (10/04 1305)   Physical Exam  Constitutional: Appears well-developed and well-nourished.  Psych: Affect appropriate to situation Eyes: No scleral injection HENT: No OP obstrucion Head: Normocephalic.  Cardiovascular: Normal rate and regular rhythm.  Respiratory: Effort normal, non-labored breathing GI: Soft.  No distension. There is no tenderness.  Skin: WDI  Neuro: Mental Status: Patient is awake, alert, oriented to person, place, month, year, and situation. Patient is able to give a clear and coherent history. No signs of aphasia or neglect Cranial Nerves: II: Visual Fields are full. Pupils are equal, round, and reactive to light.   III,IV, VI: EOMI without ptosis or diploplia.  V: Facial sensation is symmetric to temperature VII: Facial movement is symmetric.  VIII: hearing is intact to voice X: Uvula elevates symmetrically XI: Shoulder shrug is symmetric. XII: tongue is midline without atrophy or  fasciculations.  Motor: Tone is normal. Bulk is normal. 5/5 strength was present in all four extremities.  Sensory: Sensation is symmetric to light touch and temperature in the arms and legs. Cerebellar: FNF  intact bilaterally   I have reviewed labs in epic and the results pertinent to this consultation are: BMP- calcium 7.5, will check albumin to see if this is actually low.  I have reviewed the images obtained: CT head is unremarkable  Impression: 77 year old female with transient unresponsiveness of unclear etiology.  I agree that I think stroke is relatively unlikely.  Possibilities include transient hypoperfusion in the setting of severe anemia versus partial seizure.  Currently I am leaning more towards partial seizure but I think that further evaluation is needed.  Recommendations: 1) H&H, if significantly lower than this morning then acute blood loss contributing to transient altered mental status may be more likely 2) EEG 3) MRI 4) discontinue tramadol as a significantly lower seizure threshold 5) neurology will follow  Roland Rack, MD [facility] [phone]  If 7pm- 7am, please page neurology on call as listed in [place].

## 2018-03-15 NOTE — Progress Notes (Signed)
1 Day Post-Op      Subjective: She looks and feels better this AM, They did get a PICC line in her.  I doubt the AM K+ is right, but I will recheck .  Blood for transfusion has been ordered and I will recheck K+ and labs later today and in AM.   Objective: Vital signs in last 24 hours: Temp:  [97.5 F (36.4 C)-98.8 F (37.1 C)] 97.9 F (36.6 C) (10/04 0800) Pulse Rate:  [45-87] 53 (10/04 0600) Resp:  [12-26] 21 (10/04 0600) BP: (87-169)/(34-136) 169/38 (10/04 0600) SpO2:  [97 %-100 %] 99 % (10/04 0600) Last BM Date: 03/14/18 N.p.o. IV 3796 Urine 945 Afebrile, vital signs are stable K+ this a.m. 5.9.  H/H12.9/41.2 >> 7.1/21.9 Platelets 138K INR 1.09/PTT 24 Troponin less than 0.03  Intake/Output from previous day: 10/03 0701 - 10/04 0700 In: 3796.8 [I.V.:3696.8; IV Piggyback:100] Out: 970 [Urine:945; Blood:25] Intake/Output this shift: No intake/output data recorded.  General appearance: alert, cooperative, no distress and feeling much better today Resp: clear to auscultation bilaterally and some ronchi - work on IS/Flutter GI: soft sore, site OK, no further bleeding.  Lab Results:  Recent Labs    03/14/18 1554 03/14/18 2200 03/15/18 0628  WBC 21.8*  --  13.8*  HGB 9.3* 7.5* 7.1*  HCT 28.2*  --  21.9*  PLT 262  --  192    BMET Recent Labs    03/14/18 1554 03/15/18 0628  NA 144 144  K 3.7 5.9*  CL 113* 119*  CO2 26 20*  GLUCOSE 172* 93  BUN 16 16  CREATININE 1.11* 0.80  CALCIUM 8.4* 7.5*   PT/INR Recent Labs    03/14/18 1554  LABPROT 14.0  INR 1.09    No results for input(s): AST, ALT, ALKPHOS, BILITOT, PROT, ALBUMIN in the last 168 hours.   Lipase     Component Value Date/Time   LIPASE 23 12/17/2015 1154     Medications: . sodium chloride   Intravenous Once  . sodium chloride   Intravenous Once  . alvimopan  12 mg Oral BID  . Chlorhexidine Gluconate Cloth  6 each Topical Daily  . gabapentin  300 mg Oral BID  . levothyroxine  75 mcg  Oral QAC breakfast  . mouth rinse  15 mL Mouth Rinse BID  . metoprolol tartrate  25 mg Oral BID  . pramipexole  0.25 mg Oral QHS  . pravastatin  40 mg Oral QHS  . sodium chloride flush  10-40 mL Intracatheter Q12H   . 0.9 % NaCl with KCl 20 mEq / L 100 mL/hr at 03/15/18 0405   Anti-infectives (From admission, onward)   Start     Dose/Rate Route Frequency Ordered Stop   03/14/18 1400  neomycin (MYCIFRADIN) tablet 1,000 mg  Status:  Discontinued     1,000 mg Oral 3 times per day 03/14/18 0538 03/14/18 0541   03/14/18 1400  metroNIDAZOLE (FLAGYL) tablet 1,000 mg  Status:  Discontinued     1,000 mg Oral 3 times per day 03/14/18 0538 03/14/18 0541   03/14/18 0600  cefoTEtan (CEFOTAN) 2 g in sodium chloride 0.9 % 100 mL IVPB     2 g 200 mL/hr over 30 Minutes Intravenous On call to O.R. 03/14/18 1275 03/14/18 0758   03/14/18 0540  sodium chloride 0.9 % with cefoTEtan (CEFOTAN) ADS Med    Note to Pharmacy:  Waldron Session   : cabinet override      03/14/18 0540 03/14/18 1700  Assessment/Plan Low-grade papillary noninvasive carcinoma TURBT 2 to 5 cm, postop instillation of gemcitabine chemotherapy intravesical Dr. Festus Aloe Hx tobacco use Hx of CVA Hypothyroid  Cecal mass, probably inflammatory, question cecal diverticulitis Laparoscopic ileocecectomy 03/14/2018, Dr. Excell Seltzer Postop bleed  FEN: IV fluids/n.p.o. ID: Preop only DVT: SCDs only -bleeding Follow-up: Dr. Excell Seltzer  Plan:  Recheck K+ before we start the blood.  Transfuse, recheck labs in AM.      LOS: 1 day    Ana Santos 03/15/2018 [phone]

## 2018-03-15 NOTE — Consult Note (Signed)
   [facility] Consult Services    Date of Service:   03/15/2018 10:24:02  Impression:      .  Seizure vs toxic and metabolic  Comments: Consider MRI brain and EEG. Toxic and metabolic work up  Metrics: Last Known Well: 03/15/2018 10:00 [facility] Notification Time: 03/15/2018 10:23:15 Stamp Time: 03/15/2018 10:24:02 Time First Login Attempt: 03/15/2018 10:31:00 Video Start Time: 03/15/2018 10:31:00  Symptoms: Unresponsive NIHSS Start Assessment Time: 03/15/2018 10:35:00 Patient is not a candidate for tPA. Patient was not deemed candidate for tPA thrombolytics because of NIHSS 0 now. Video End Time: 03/15/2018 10:42:00  CT head showed no acute hemorrhage or acute core infarct. CT head was reviewed.  Advanced imaging was not obtained as the presentation was not suggestive of Large Vessel Occlusive Disease.    Our recommendations are outlined below.  Recommendations:    Routine Consultation with [facility] for Follow up Care  Sign Out:     .  Discussed with Emergency Department Provider    ------------------------------------------------------------------------------  History of Present Illness:  Patient is a 77 years old Female.  Inpatient stroke alert was called for symptoms of Unresponsive  Patient became unresponsive this am, she is in the hospital and had colectomy and also GI bleeding. When i saw her after CT head she was awake and back to baseline, report from nurse that she was eyes open, not responding, not following commands, gaze to right side. Patient does not remember any of this.  CT head showed no acute hemorrhage or acute core infarct. CT head was reviewed.    Examination:  1A: Level of Consciousness - Alert; keenly responsive + 0 1B: Ask Month and Age - Both Questions Right + 0 1C: Blink Eyes & Squeeze Hands - Performs Both Tasks + 0 2: Test Horizontal Extraocular Movements - Normal + 0 3: Test Visual Fields -  No Visual Loss + 0 4: Test Facial Palsy (Use Grimace if Obtunded) - Normal symmetry + 0 5A: Test Left Arm Motor Drift - No Drift for 10 Seconds + 0 5B: Test Right Arm Motor Drift - No Drift for 10 Seconds + 0 6A: Test Left Leg Motor Drift - No Drift for 5 Seconds + 0 6B: Test Right Leg Motor Drift - No Drift for 5 Seconds + 0 7: Test Limb Ataxia (FNF/Heel-Shin) - No Ataxia + 0 8: Test Sensation - Normal; No sensory loss + 0 9: Test Language/Aphasia - Normal; No aphasia + 0 10: Test Dysarthria - Normal + 0 11: Test Extinction/Inattention - No abnormality + 0  NIHSS Score: 0  Patient was informed the Neurology Consult would happen via TeleHealth consult by way of interactive audio and video telecommunications and consented to receiving care in this manner.  Due to the immediate potential for life-threatening deterioration due to underlying acute neurologic illness, I spent 35 minutes providing critical care. This time includes time for face to face visit via telemedicine, review of medical records, imaging studies and discussion of findings with providers, the patient and/or family.   Dr Lemar Livings   [facility] [phone]

## 2018-03-15 NOTE — Progress Notes (Signed)
Pt's husband came out and got Unit director that was walking by and stated that pt was not her self. Director called RN to called Rapid nurse to pt's room. Pt husband reported that pt was having a conversation with him when she all of a sudden became non-responsive, not following commands. RN Assessed pt's mental state and noted that she was unresponsive, pupils were fixed, unable to follow simple commands. Rapid nurse then stated to get pt ready for transport to CT, code stroke was called. While getting pt ready for transport pt came back and started to speak and was trying to get out of bed. RN reoriented pt, as RN is doing this pt then became non responsive and giving RN the blank stare. This happened 2 other times before, arriving to CT.

## 2018-03-15 NOTE — Care Management Note (Signed)
Case Management Note  Patient Details  Name: JREAM BROYLES MRN: [id number] Date of Birth: 1941-05-24  Subjective/Objective:                  Pod 1,TURBT   hgb down to 7.1- one unit prbs planned, npo,   Action/Plan: Following for progression of care and discharge planning No cm needs present at this time.  Expected Discharge Date:                  Expected Discharge Plan:  Home/Self Care  In-House Referral:     Discharge planning Services  CM Consult  Post Acute Care Choice:    Choice offered to:     DME Arranged:    DME Agency:     HH Arranged:    HH Agency:     Status of Service:  In process, will continue to follow  If discussed at Long Length of Stay Meetings, dates discussed:    Additional Comments:  Leeroy Cha, RN 03/15/2018, 10:03 AM

## 2018-03-15 NOTE — Progress Notes (Signed)
Bedside EEG completed; results pending. 

## 2018-03-16 ENCOUNTER — Inpatient Hospital Stay (HOSPITAL_COMMUNITY): Payer: Medicare Other

## 2018-03-16 DIAGNOSIS — R569 Unspecified convulsions: Secondary | ICD-10-CM

## 2018-03-16 LAB — CBC
HCT: 28.1 % — ABNORMAL LOW (ref 36.0–46.0)
HEMOGLOBIN: 9.3 g/dL — AB (ref 12.0–15.0)
MCH: 29.4 pg (ref 26.0–34.0)
MCHC: 33.1 g/dL (ref 30.0–36.0)
MCV: 88.9 fL (ref 78.0–100.0)
PLATELETS: 191 10*3/uL (ref 150–400)
RBC: 3.16 MIL/uL — ABNORMAL LOW (ref 3.87–5.11)
RDW: 14 % (ref 11.5–15.5)
WBC: 13.6 10*3/uL — AB (ref 4.0–10.5)

## 2018-03-16 LAB — PROTIME-INR
INR: 1.12
PROTHROMBIN TIME: 14.3 s (ref 11.4–15.2)

## 2018-03-16 LAB — BASIC METABOLIC PANEL
Anion gap: 9 (ref 5–15)
BUN: 10 mg/dL (ref 8–23)
CALCIUM: 8.5 mg/dL — AB (ref 8.9–10.3)
CO2: 22 mmol/L (ref 22–32)
CREATININE: 0.72 mg/dL (ref 0.44–1.00)
Chloride: 110 mmol/L (ref 98–111)
Glucose, Bld: 82 mg/dL (ref 70–99)
Potassium: 3.3 mmol/L — ABNORMAL LOW (ref 3.5–5.1)
SODIUM: 141 mmol/L (ref 135–145)

## 2018-03-16 LAB — HEMOGLOBIN AND HEMATOCRIT, BLOOD
HCT: 27 % — ABNORMAL LOW (ref 36.0–46.0)
Hemoglobin: 8.9 g/dL — ABNORMAL LOW (ref 12.0–15.0)

## 2018-03-16 LAB — APTT: aPTT: 27 seconds (ref 24–36)

## 2018-03-16 NOTE — Progress Notes (Signed)
Assessment & Plan: POD#2 - status post lap assisted right colectomy for cecal mass  Post op staple line bleeding - has received 1 unit PRBC's  Hgb 9.3 this AM - two bloody BM's this am, approx 300cc total per nursing  VSS - monitoring in ICU  Will repeat labs at 11 AM this morning  NPO except meds, sips of water        Earnstine Regal, MD, [facility]       Office: [phone]   Chief Complaint: Cecal mass  Subjective: Patient in bed, comfortable, responsive.  Objective: Vital signs in last 24 hours: Temp:  [98.1 F (36.7 C)-98.9 F (37.2 C)] 98.9 F (37.2 C) (10/05 0346) Pulse Rate:  [59-93] 66 (10/05 0904) Resp:  [12-30] 30 (10/05 0904) BP: (95-185)/(39-124) 185/83 (10/05 0904) SpO2:  [94 %-99 %] 97 % (10/05 0904) Last BM Date: 03/15/18  Intake/Output from previous day: 10/04 0701 - 10/05 0700 In: 2153.1 [I.V.:1827.1; Blood:326] Out: 2350 [Urine:2350] Intake/Output this shift: Total I/O In: -  Out: 850 [Urine:850]  Physical Exam: HEENT - sclerae clear, mucous membranes moist Neck - soft Chest - clear bilaterally Cor - RRR Abdomen - soft, mild distension; diffuse mild tenderness with voluntary guarding; dressings dry and intact Ext - no edema, non-tender Neuro - alert & oriented, no focal deficits  Lab Results:  Recent Labs    03/15/18 0628  03/15/18 1712 03/16/18 0508  WBC 13.8*  --   --  13.6*  HGB 7.1*   < > 8.0* 9.3*  HCT 21.9*   < > 24.7* 28.1*  PLT 192  --   --  191   < > = values in this interval not displayed.   BMET Recent Labs    03/15/18 0628 03/15/18 1146 03/16/18 0508  NA 144  --  141  K 5.9* 3.8 3.3*  CL 119*  --  110  CO2 20*  --  22  GLUCOSE 93  --  82  BUN 16  --  10  CREATININE 0.80  --  0.72  CALCIUM 7.5*  --  8.5*   PT/INR Recent Labs    03/14/18 1554  LABPROT 14.0  INR 1.09   Comprehensive Metabolic Panel:    Component Value Date/Time   NA 141 03/16/2018 0508   NA 144  03/15/2018 0628   K 3.3 (L) 03/16/2018 0508   K 3.8 03/15/2018 1146   CL 110 03/16/2018 0508   CL 119 (H) 03/15/2018 0628   CO2 22 03/16/2018 0508   CO2 20 (L) 03/15/2018 0628   BUN 10 03/16/2018 0508   BUN 16 03/15/2018 0628   CREATININE 0.72 03/16/2018 0508   CREATININE 0.80 03/15/2018 0628   GLUCOSE 82 03/16/2018 0508   GLUCOSE 93 03/15/2018 0628   CALCIUM 8.5 (L) 03/16/2018 0508   CALCIUM 7.5 (L) 03/15/2018 0628   AST 15 10/23/2017 1059   AST 15 04/25/2017 1130   ALT 11 10/23/2017 1059   ALT 7 04/25/2017 1130   ALKPHOS 114 10/23/2017 1059   ALKPHOS 99 04/25/2017 1130   BILITOT 0.4 10/23/2017 1059   BILITOT 0.4 04/25/2017 1130   PROT 8.0 10/23/2017 1059   PROT 7.5 04/25/2017 1130   ALBUMIN 2.5 (L) 03/15/2018 1712   ALBUMIN 4.2 10/23/2017 1059    Studies/Results: Ct Head Code Stroke Wo Contrast  Result Date: 03/15/2018 CLINICAL DATA:  Code stroke. Ataxia, stroke suspected. No asymmetric weakness  per report EXAM: CT HEAD WITHOUT CONTRAST TECHNIQUE: Contiguous axial images were obtained from the base of the skull through the vertex without intravenous contrast. COMPARISON:  03/14/2015 brain MRI FINDINGS: Brain: Small remote right cerebellar infarcts. No acute infarct, hemorrhage, hydrocephalus, or collection. Vascular: No hyperdense vessel. There is atherosclerotic calcification. Changes of stent assisted basilar tip aneurysm coiling. Skull: Right wall up mastoidectomy, reportedly for transmastoid schwannoma resection. The bony defect is soft tissue density. Sinuses/Orbits: No acute finding Other: A page was placed 03/15/2018 at 10:38 am to Dr. Excell Seltzer. ASPECTS [facility] Stroke Program Early CT Score) Not scored with this history IMPRESSION: 1. No acute finding. 2. Stent assisted basilar tip aneurysm coiling. 3. Small remote right cerebellar infarcts. Electronically Signed   By: Monte Fantasia M.D.   On: 03/15/2018 10:39   [place] Ekg Site Rite  Result Date: 03/14/2018 If Site  Rite image not attached, placement could not be confirmed due to current cardiac rhythm.     Michoel Kunin M 03/16/2018  Patient ID: Hamilton Capri, female   DOB: 15-Sep-1940, 77 y.o.   MRN: [id number]

## 2018-03-16 NOTE — Plan of Care (Signed)
  Problem: Pain Managment: Goal: General experience of comfort will improve Outcome: Progressing   

## 2018-03-16 NOTE — Progress Notes (Signed)
She has had no further events.  She is awake and alert and interactive today.  Her MRI is normal.  I do suspect that this event represented a complex partial seizure.  This is a single seizure in the setting of recent physiological stressors as well as tramadol use and therefore I do not think that she necessarily needs long-term antiepileptic therapy at this time.    If you were to have any further events, however, then she would need lifelong antiepileptic therapy following that.  I have discussed with her seizure precautions including that she is not allowed to drive by law for 6 months from the most recent episode.  No further recommendations at this time, please call with further questions or concerns.  Roland Rack, MD [facility] [phone]  If 7pm- 7am, please page neurology on call as listed in [place].

## 2018-03-17 LAB — HEMOGLOBIN: HEMOGLOBIN: 8.6 g/dL — AB (ref 12.0–15.0)

## 2018-03-17 NOTE — Progress Notes (Signed)
Assessment & Plan: POD#3 - status post lap assisted right colectomy for cecal mass             Post op staple line bleeding - has received 1 unit PRBC's             Hgb 8.6 this AM - no further evidence of bleeding per rectum             OK to transfer to floor today             Begin clear liquid diet        Armandina Gemma, MD       [facility]       Office: [phone]   Chief Complaint: Status post lap assisted right colectomy  Subjective: Patient comfortable, passing flatus.  Family in room.  Wants a cheeseburger.  Objective: Vital signs in last 24 hours: Temp:  [97.6 F (36.4 C)-98.6 F (37 C)] 97.6 F (36.4 C) (10/06 0349) Pulse Rate:  [58-88] 58 (10/06 0400) Resp:  [18-30] 24 (10/06 0400) BP: (108-185)/(29-83) 138/36 (10/06 0400) SpO2:  [92 %-97 %] 94 % (10/06 0400) Last BM Date: 03/16/18  Intake/Output from previous day: 10/05 0701 - 10/06 0700 In: 2682.9 [I.V.:2682.9] Out: 2350 [Urine:2350] Intake/Output this shift: No intake/output data recorded.  Physical Exam: HEENT - sclerae clear, mucous membranes moist Neck - soft Chest - clear bilaterally Cor - RRR Abdomen - soft with mild distension; dressing dry; BS present Ext - no edema, non-tender Neuro - alert & oriented, no focal deficits  Lab Results:  Recent Labs    03/15/18 0628  03/16/18 0508 03/16/18 1222 03/17/18 0500  WBC 13.8*  --  13.6*  --   --   HGB 7.1*   < > 9.3* 8.9* 8.6*  HCT 21.9*   < > 28.1* 27.0*  --   PLT 192  --  191  --   --    < > = values in this interval not displayed.   BMET Recent Labs    03/15/18 0628 03/15/18 1146 03/16/18 0508  NA 144  --  141  K 5.9* 3.8 3.3*  CL 119*  --  110  CO2 20*  --  22  GLUCOSE 93  --  82  BUN 16  --  10  CREATININE 0.80  --  0.72  CALCIUM 7.5*  --  8.5*   PT/INR Recent Labs    03/14/18 1554 03/16/18 1222  LABPROT 14.0 14.3  INR 1.09 1.12   Comprehensive Metabolic Panel:    Component Value Date/Time    NA 141 03/16/2018 0508   NA 144 03/15/2018 0628   K 3.3 (L) 03/16/2018 0508   K 3.8 03/15/2018 1146   CL 110 03/16/2018 0508   CL 119 (H) 03/15/2018 0628   CO2 22 03/16/2018 0508   CO2 20 (L) 03/15/2018 0628   BUN 10 03/16/2018 0508   BUN 16 03/15/2018 0628   CREATININE 0.72 03/16/2018 0508   CREATININE 0.80 03/15/2018 0628   GLUCOSE 82 03/16/2018 0508   GLUCOSE 93 03/15/2018 0628   CALCIUM 8.5 (L) 03/16/2018 0508   CALCIUM 7.5 (L) 03/15/2018 0628   AST 15 10/23/2017 1059   AST 15 04/25/2017 1130   ALT 11 10/23/2017 1059   ALT 7 04/25/2017 1130   ALKPHOS 114 10/23/2017 1059   ALKPHOS 99 04/25/2017 1130   BILITOT 0.4 10/23/2017 1059   BILITOT 0.4 04/25/2017 1130   PROT 8.0  10/23/2017 1059   PROT 7.5 04/25/2017 1130   ALBUMIN 2.5 (L) 03/15/2018 1712   ALBUMIN 4.2 10/23/2017 1059    Studies/Results: Mr Brain Wo Contrast  Result Date: 03/16/2018 CLINICAL DATA:  77 year old female with episode of unresponsiveness, possible seizure. Postoperative day 2 abdominal surgery for cecal mass. History of basilar tip aneurysm coil embolization in 2010. Right vestibular schwannoma resection in 2002. EXAM: MRI HEAD WITHOUT CONTRAST TECHNIQUE: Multiplanar, multiecho pulse sequences of the brain and surrounding structures were obtained without intravenous contrast. COMPARISON:  Brain MRI 03/14/2015. cervical spine MRI 07/14/2015. FINDINGS: Brain: Stable small chronic infarcts in the cerebellum, primarily on the right, since 2016. No restricted diffusion or evidence of acute infarction. Elsewhere gray and white matter signal appears stable since 2016 and normal for age. No cerebral cortical encephalomalacia or chronic cerebral blood products. The hippocampal formations appear symmetric and within normal limits. Other mesial temporal lobe structures appear normal. No midline shift, mass effect, evidence of mass lesion, ventriculomegaly, extra-axial collection or acute intracranial hemorrhage.  Cervicomedullary junction and pituitary are within normal limits. Vascular: Major intracranial vascular flow voids are stable. Chronic susceptibility artifact at the basilar tip compatible with vascular stent and coil pack. Skull and upper cervical spine: Stable visible cervical spine since 2017. C3-C4 and C4-C5 level spinal stenosis. Visualized bone marrow signal is within normal limits. Sinuses/Orbits: Stable and negative. Other: Chronic postoperative changes to the right temporal bone with mastoidectomy and chronic fluid in the surgical defect. The appearance has not significantly changed since 2016. Contralateral left internal auditory structures and mastoids appear unremarkable. Normal stylomastoid foramina. Visualized scalp soft tissues are within normal limits. IMPRESSION: 1.  No acute intracranial abnormality. 2. Stable MRI appearance of the brain since 2016, remarkable for small chronic cerebellar infarcts, stenting and endovascular coil embolization of the basilar tip. 3. Chronic right mastoidectomy. Chronic cervical spine degeneration. Electronically Signed   By: Genevie Ann M.D.   On: 03/16/2018 12:54   Ct Head Code Stroke Wo Contrast  Result Date: 03/15/2018 CLINICAL DATA:  Code stroke. Ataxia, stroke suspected. No asymmetric weakness per report EXAM: CT HEAD WITHOUT CONTRAST TECHNIQUE: Contiguous axial images were obtained from the base of the skull through the vertex without intravenous contrast. COMPARISON:  03/14/2015 brain MRI FINDINGS: Brain: Small remote right cerebellar infarcts. No acute infarct, hemorrhage, hydrocephalus, or collection. Vascular: No hyperdense vessel. There is atherosclerotic calcification. Changes of stent assisted basilar tip aneurysm coiling. Skull: Right wall up mastoidectomy, reportedly for transmastoid schwannoma resection. The bony defect is soft tissue density. Sinuses/Orbits: No acute finding Other: A page was placed 03/15/2018 at 10:38 am to Dr. Excell Seltzer.  ASPECTS [facility] Stroke Program Early CT Score) Not scored with this history IMPRESSION: 1. No acute finding. 2. Stent assisted basilar tip aneurysm coiling. 3. Small remote right cerebellar infarcts. Electronically Signed   By: Monte Fantasia M.D.   On: 03/15/2018 10:39      Amberlynn Tempesta M 03/17/2018  Patient ID: Ana Santos, female   DOB: 07-04-1940, 77 y.o.   MRN: [id number]

## 2018-03-18 LAB — BPAM RBC
BLOOD PRODUCT EXPIRATION DATE: 201910252359
BLOOD PRODUCT EXPIRATION DATE: 201910292359
ISSUE DATE / TIME: 201910041237
Unit Type and Rh: 6200
Unit Type and Rh: 6200

## 2018-03-18 LAB — TYPE AND SCREEN
ABO/RH(D): A POS
ANTIBODY SCREEN: NEGATIVE
UNIT DIVISION: 0
Unit division: 0

## 2018-03-18 LAB — HEMOGLOBIN: HEMOGLOBIN: 8.3 g/dL — AB (ref 12.0–15.0)

## 2018-03-18 MED ORDER — DOCUSATE SODIUM 100 MG PO CAPS
100.0000 mg | ORAL_CAPSULE | Freq: Two times a day (BID) | ORAL | Status: DC
  Administered 2018-03-18 – 2018-03-19 (×3): 100 mg via ORAL
  Filled 2018-03-18 (×3): qty 1

## 2018-03-18 NOTE — Care Management Important Message (Signed)
Important Message  Patient Details  Name: Ana Santos MRN: [id number] Date of Birth: [birth year]   Medicare Important Message Given:  Yes    Kerin Salen 03/18/2018, 1:47 [place] Message  Patient Details  Name: Ana Santos MRN: [id number] Date of Birth: 28-Nov-1940   Medicare Important Message Given:  Yes    Kerin Salen 03/18/2018, 1:46 PM

## 2018-03-19 MED ORDER — OXYCODONE HCL 5 MG PO TABS: 5.0000 mg | ORAL_TABLET | Freq: Four times a day (QID) | ORAL | 0 refills | Status: DC | PRN

## 2018-03-19 NOTE — Progress Notes (Signed)
5 Days Post-Op  Subjective: Doing well.  Tolerating liquids.  Passing flatus and she had a BM.  Pain controlled.  Objective: Vital signs in last 24 hours: Temp:  [98.4 F (36.9 C)-100.3 F (37.9 C)] 98.4 F (36.9 C) (10/08 0602) Pulse Rate:  [59-86] 61 (10/08 0801) Resp:  [12-20] 20 (10/08 0602) BP: (118-146)/(53-80) 139/56 (10/08 0602) SpO2:  [90 %-98 %] 98 % (10/08 0801)   Intake/Output from previous day: 10/07 0701 - 10/08 0700 In: 563.8 [P.O.:240; I.V.:323.8] Out: 950 [Urine:950] Intake/Output this shift: No intake/output data recorded.   General appearance: alert and cooperative GI: normal findings: soft, non-tender  Incision: healing well  Lab Results:  Recent Labs    03/16/18 1222 03/17/18 0500 03/18/18 0503  HGB 8.9* 8.6* 8.3*  HCT 27.0*  --   --    BMET No results for input(s): NA, K, CL, CO2, GLUCOSE, BUN, CREATININE, CALCIUM in the last 72 hours. PT/INR Recent Labs    03/16/18 1222  LABPROT 14.3  INR 1.12   ABG No results for input(s): PHART, HCO3 in the last 72 hours.  Invalid input(s): PCO2, PO2  MEDS, Scheduled . Chlorhexidine Gluconate Cloth  6 each Topical Daily  . docusate sodium  100 mg Oral BID  . gabapentin  300 mg Oral BID  . levothyroxine  75 mcg Oral QAC breakfast  . mouth rinse  15 mL Mouth Rinse BID  . metoprolol tartrate  25 mg Oral BID  . pramipexole  0.25 mg Oral QHS  . pravastatin  40 mg Oral QHS  . sodium chloride flush  10-40 mL Intracatheter Q12H    Studies/Results: No results found.  Assessment: s/p Procedure(s): LAPAROSCOPY LAPAROSCOPIC ASSISTED  RIGHT COLECTOMY Patient Active Problem List   Diagnosis Date Noted  . Colonic mass s/p lap ileocectomy 03/14/2018 03/14/2018  . Acute lower GI bleeding 03/14/2018  . Chronic anticoagulation 03/14/2018  . Chronic tension-type headache, not intractable 05/31/2015  . Cephalalgia 02/22/2015  . Acute cystitis 01/04/2015  . Symptomatic PVCs 08/04/2014  . Chest pain  08/03/2014  . Chronic headaches 08/03/2014  . Cerebellar artery occlusion 03/11/2014  . TIA (transient ischemic attack) 08/26/2013  . Stroke ([place]) 08/12/2013  . Palpitations 07/09/2013  . Restless legs syndrome (RLS) 07/04/2012  . Cerebral artery occlusion with cerebral infarction ([place]) 03/30/2010  . GENERALIZED PAIN 03/28/2010  . Cerebrovascular disease 07/13/2009  . ANEURYSM 03/26/2009  . Vitamin D deficiency 08/10/2008  . VENOUS INSUFFICIENCY 08/10/2008  . Disorder of bone and cartilage 12/18/2007  . INSOMNIA 09/16/2007  . 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[place]) 08/18/2007  . Hypothyroidism 08/18/2007  . CIGARETTE SMOKER 08/18/2007  . Hyperlipidemia 07/18/2007  . Anxiety state 07/18/2007  . Mitral valve disorder 07/18/2007  . COPD (chronic obstructive pulmonary disease) with chronic bronchitis ([place]) 07/18/2007  . IRRITABLE BOWEL SYNDROME 07/18/2007  . NEPHROLITHIASIS 07/18/2007  . FIBROMYALGIA 07/18/2007    Expected post op course  Plan: Cont soft foods Wean O2 D/c Home   LOS: 5 days     .Ana Santos, [facility], [place]   03/19/2018 8:36 AM

## 2018-03-19 NOTE — Progress Notes (Signed)
Pt discharge instructions reviewed with patient . IV nurse currently removing PICC line. Pt will need to remain flat for 30 minutes. No questions or concerns at this time.

## 2018-03-19 NOTE — Discharge Instructions (Signed)

## 2018-03-19 NOTE — Progress Notes (Signed)
5 Days Post-Op  Subjective: Doing well.  Tolerating liquids.  Passing flatus.  Pain controlled.  Objective: Vital signs in last 24 hours: Temp:  [98.4 F (36.9 C)-100.3 F (37.9 C)] 98.4 F (36.9 C) (10/08 0602) Pulse Rate:  [59-86] 61 (10/08 0801) Resp:  [12-20] 20 (10/08 0602) BP: (118-146)/(53-80) 139/56 (10/08 0602) SpO2:  [90 %-98 %] 98 % (10/08 0801)   Intake/Output from previous day: 10/07 0701 - 10/08 0700 In: 563.8 [P.O.:240; I.V.:323.8] Out: 950 [Urine:950] Intake/Output this shift: No intake/output data recorded.   General appearance: alert and cooperative GI: normal findings: soft, non-tender  Incision: healing well  Lab Results:  Recent Labs    03/16/18 1222 03/17/18 0500 03/18/18 0503  HGB 8.9* 8.6* 8.3*  HCT 27.0*  --   --    BMET No results for input(s): NA, K, CL, CO2, GLUCOSE, BUN, CREATININE, CALCIUM in the last 72 hours. PT/INR Recent Labs    03/16/18 1222  LABPROT 14.3  INR 1.12   ABG No results for input(s): PHART, HCO3 in the last 72 hours.  Invalid input(s): PCO2, PO2  MEDS, Scheduled . Chlorhexidine Gluconate Cloth  6 each Topical Daily  . docusate sodium  100 mg Oral BID  . gabapentin  300 mg Oral BID  . levothyroxine  75 mcg Oral QAC breakfast  . mouth rinse  15 mL Mouth Rinse BID  . metoprolol tartrate  25 mg Oral BID  . pramipexole  0.25 mg Oral QHS  . pravastatin  40 mg Oral QHS  . sodium chloride flush  10-40 mL Intracatheter Q12H    Studies/Results: No results found.  Assessment: s/p Procedure(s): LAPAROSCOPY LAPAROSCOPIC ASSISTED  RIGHT COLECTOMY Patient Active Problem List   Diagnosis Date Noted  . Colonic mass s/p lap ileocectomy 03/14/2018 03/14/2018  . Acute lower GI bleeding 03/14/2018  . Chronic anticoagulation 03/14/2018  . Chronic tension-type headache, not intractable 05/31/2015  . Cephalalgia 02/22/2015  . Acute cystitis 01/04/2015  . Symptomatic PVCs 08/04/2014  . Chest pain 08/03/2014  .  Chronic headaches 08/03/2014  . Cerebellar artery occlusion 03/11/2014  . TIA (transient ischemic attack) 08/26/2013  . Stroke ([place]) 08/12/2013  . Palpitations 07/09/2013  . Restless legs syndrome (RLS) 07/04/2012  . Cerebral artery occlusion with cerebral infarction ([place]) 03/30/2010  . GENERALIZED PAIN 03/28/2010  . Cerebrovascular disease 07/13/2009  . ANEURYSM 03/26/2009  . Vitamin D deficiency 08/10/2008  . VENOUS INSUFFICIENCY 08/10/2008  . Disorder of bone and cartilage 12/18/2007  . INSOMNIA 09/16/2007  . 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[place]) 08/18/2007  . Hypothyroidism 08/18/2007  . CIGARETTE SMOKER 08/18/2007  . Hyperlipidemia 07/18/2007  . Anxiety state 07/18/2007  . Mitral valve disorder 07/18/2007  . COPD (chronic obstructive pulmonary disease) with chronic bronchitis ([place]) 07/18/2007  . IRRITABLE BOWEL SYNDROME 07/18/2007  . NEPHROLITHIASIS 07/18/2007  . FIBROMYALGIA 07/18/2007    Expected post op course  Plan: Advance diet to soft foods Wean O2 Ambulate   LOS: 5 days     .Rosario Adie, [facility], [place]   03/19/2018 8:30 AM

## 2018-03-22 ENCOUNTER — Inpatient Hospital Stay: Payer: Self-pay

## 2018-03-22 ENCOUNTER — Inpatient Hospital Stay (HOSPITAL_COMMUNITY)
Admission: EM | Admit: 2018-03-22 | Discharge: 2018-03-24 | DRG: 378 | Disposition: A | Discharge status: Home / Self Care | Payer: Medicare Other | Attending: General Surgery | Admitting: General Surgery

## 2018-03-22 ENCOUNTER — Encounter (HOSPITAL_COMMUNITY): Payer: Self-pay

## 2018-03-22 ENCOUNTER — Other Ambulatory Visit: Payer: Self-pay

## 2018-03-22 DIAGNOSIS — T45525A Adverse effect of antithrombotic drugs, initial encounter: Secondary | ICD-10-CM | POA: Diagnosis present

## 2018-03-22 DIAGNOSIS — I2510 Atherosclerotic heart disease of native coronary artery without angina pectoris: Secondary | ICD-10-CM | POA: Diagnosis present

## 2018-03-22 DIAGNOSIS — Z8673 Personal history of transient ischemic attack (TIA), and cerebral infarction without residual deficits: Secondary | ICD-10-CM

## 2018-03-22 DIAGNOSIS — R402143 Coma scale, eyes open, spontaneous, at hospital admission: Secondary | ICD-10-CM | POA: Diagnosis present

## 2018-03-22 DIAGNOSIS — Z7902 Long term (current) use of antithrombotics/antiplatelets: Secondary | ICD-10-CM

## 2018-03-22 DIAGNOSIS — F17210 Nicotine dependence, cigarettes, uncomplicated: Secondary | ICD-10-CM | POA: Diagnosis present

## 2018-03-22 DIAGNOSIS — Z9049 Acquired absence of other specified parts of digestive tract: Secondary | ICD-10-CM | POA: Diagnosis not present

## 2018-03-22 DIAGNOSIS — Z79899 Other long term (current) drug therapy: Secondary | ICD-10-CM

## 2018-03-22 DIAGNOSIS — Z886 Allergy status to analgesic agent status: Secondary | ICD-10-CM

## 2018-03-22 DIAGNOSIS — M8580 Other specified disorders of bone density and structure, unspecified site: Secondary | ICD-10-CM | POA: Diagnosis present

## 2018-03-22 DIAGNOSIS — H9191 Unspecified hearing loss, right ear: Secondary | ICD-10-CM | POA: Diagnosis present

## 2018-03-22 DIAGNOSIS — R402253 Coma scale, best verbal response, oriented, at hospital admission: Secondary | ICD-10-CM | POA: Diagnosis present

## 2018-03-22 DIAGNOSIS — J449 Chronic obstructive pulmonary disease, unspecified: Secondary | ICD-10-CM | POA: Diagnosis present

## 2018-03-22 DIAGNOSIS — I679 Cerebrovascular disease, unspecified: Secondary | ICD-10-CM | POA: Diagnosis present

## 2018-03-22 DIAGNOSIS — E559 Vitamin D deficiency, unspecified: Secondary | ICD-10-CM | POA: Diagnosis present

## 2018-03-22 DIAGNOSIS — F419 Anxiety disorder, unspecified: Secondary | ICD-10-CM | POA: Diagnosis present

## 2018-03-22 DIAGNOSIS — I872 Venous insufficiency (chronic) (peripheral): Secondary | ICD-10-CM | POA: Diagnosis present

## 2018-03-22 DIAGNOSIS — Z8679 Personal history of other diseases of the circulatory system: Secondary | ICD-10-CM

## 2018-03-22 DIAGNOSIS — I341 Nonrheumatic mitral (valve) prolapse: Secondary | ICD-10-CM | POA: Diagnosis present

## 2018-03-22 DIAGNOSIS — R51 Headache: Secondary | ICD-10-CM | POA: Diagnosis present

## 2018-03-22 DIAGNOSIS — K922 Gastrointestinal hemorrhage, unspecified: Secondary | ICD-10-CM | POA: Diagnosis present

## 2018-03-22 DIAGNOSIS — E039 Hypothyroidism, unspecified: Secondary | ICD-10-CM | POA: Diagnosis present

## 2018-03-22 DIAGNOSIS — Z86018 Personal history of other benign neoplasm: Secondary | ICD-10-CM | POA: Diagnosis not present

## 2018-03-22 DIAGNOSIS — E785 Hyperlipidemia, unspecified: Secondary | ICD-10-CM | POA: Diagnosis present

## 2018-03-22 DIAGNOSIS — R402363 Coma scale, best motor response, obeys commands, at hospital admission: Secondary | ICD-10-CM | POA: Diagnosis present

## 2018-03-22 DIAGNOSIS — K625 Hemorrhage of anus and rectum: Secondary | ICD-10-CM | POA: Diagnosis not present

## 2018-03-22 DIAGNOSIS — Z7982 Long term (current) use of aspirin: Secondary | ICD-10-CM

## 2018-03-22 DIAGNOSIS — Z888 Allergy status to other drugs, medicaments and biological substances status: Secondary | ICD-10-CM

## 2018-03-22 DIAGNOSIS — D62 Acute posthemorrhagic anemia: Secondary | ICD-10-CM | POA: Diagnosis present

## 2018-03-22 DIAGNOSIS — Z885 Allergy status to narcotic agent status: Secondary | ICD-10-CM

## 2018-03-22 LAB — CBC
HCT: 23.2 % — ABNORMAL LOW (ref 36.0–46.0)
HEMATOCRIT: 31.2 % — AB (ref 36.0–46.0)
HEMOGLOBIN: 9.3 g/dL — AB (ref 12.0–15.0)
HEMOGLOBIN: 7.1 g/dL — AB (ref 12.0–15.0)
MCH: 27.8 pg (ref 26.0–34.0)
MCH: 28.1 pg (ref 26.0–34.0)
MCHC: 29.8 g/dL — AB (ref 30.0–36.0)
MCHC: 30.6 g/dL (ref 30.0–36.0)
MCV: 93.4 fL (ref 80.0–100.0)
MCV: 91.7 fL (ref 80.0–100.0)
Platelets: 471 10*3/uL — ABNORMAL HIGH (ref 150–400)
Platelets: 352 10*3/uL (ref 150–400)
RBC: 3.34 MIL/uL — AB (ref 3.87–5.11)
RBC: 2.53 MIL/uL — ABNORMAL LOW (ref 3.87–5.11)
RDW: 14 % (ref 11.5–15.5)
RDW: 13.8 % (ref 11.5–15.5)
WBC: 13.8 10*3/uL — ABNORMAL HIGH (ref 4.0–10.5)
WBC: 9.7 10*3/uL (ref 4.0–10.5)
nRBC: 0 % (ref 0.0–0.2)
nRBC: 0 % (ref 0.0–0.2)

## 2018-03-22 LAB — DIFFERENTIAL
BASOS ABS: 0 10*3/uL (ref 0.0–0.1)
Basophils Relative: 0 %
EOS ABS: 0.2 10*3/uL (ref 0.0–0.5)
EOS PCT: 1 %
LYMPHS ABS: 2.6 10*3/uL (ref 0.7–4.0)
LYMPHS PCT: 19 %
MONO ABS: 0.7 10*3/uL (ref 0.1–1.0)
MONOS PCT: 5 %
NEUTROS ABS: 10.2 10*3/uL — AB (ref 1.7–7.7)
NEUTROS PCT: 74 %

## 2018-03-22 LAB — COMPREHENSIVE METABOLIC PANEL
ALBUMIN: 3.3 g/dL — AB (ref 3.5–5.0)
ALT: 12 U/L (ref 0–44)
ANION GAP: 10 (ref 5–15)
AST: 20 U/L (ref 15–41)
Alkaline Phosphatase: 68 U/L (ref 38–126)
BUN: 12 mg/dL (ref 8–23)
CHLORIDE: 112 mmol/L — AB (ref 98–111)
CO2: 24 mmol/L (ref 22–32)
Calcium: 9.2 mg/dL (ref 8.9–10.3)
Creatinine, Ser: 0.91 mg/dL (ref 0.44–1.00)
GFR calc Af Amer: >60 mL/min (ref >60)
GFR, EST NON AFRICAN AMERICAN: 59 mL/min — AB (ref >60)
Glucose, Bld: 139 mg/dL — ABNORMAL HIGH (ref 70–99)
POTASSIUM: 3.1 mmol/L — AB (ref 3.5–5.1)
Sodium: 146 mmol/L — ABNORMAL HIGH (ref 135–145)
Total Bilirubin: 0.3 mg/dL (ref 0.3–1.2)
Total Protein: 6.5 g/dL (ref 6.5–8.1)

## 2018-03-22 LAB — PROTIME-INR
INR: 1.05
Prothrombin Time: 13.6 seconds (ref 11.4–15.2)

## 2018-03-22 LAB — ABO/RH: ABO/RH(D): A POS

## 2018-03-22 LAB — I-STAT CHEM 8, ED
BUN: 12 mg/dL (ref 8–23)
CALCIUM ION: 1.11 mmol/L — AB (ref 1.15–1.40)
CREATININE: 0.7 mg/dL (ref 0.44–1.00)
Chloride: 112 mmol/L — ABNORMAL HIGH (ref 98–111)
Glucose, Bld: 80 mg/dL (ref 70–99)
HEMATOCRIT: 23 % — AB (ref 36.0–46.0)
HEMOGLOBIN: 7.8 g/dL — AB (ref 12.0–15.0)
Potassium: 3 mmol/L — ABNORMAL LOW (ref 3.5–5.1)
Sodium: 146 mmol/L — ABNORMAL HIGH (ref 135–145)
TCO2: 23 mmol/L (ref 22–32)

## 2018-03-22 LAB — PREPARE RBC (CROSSMATCH)

## 2018-03-22 MED ORDER — OXYCODONE HCL 5 MG PO TABS
5.0000 mg | ORAL_TABLET | ORAL | Status: DC | PRN
  Administered 2018-03-22 – 2018-03-24 (×7): 5 mg via ORAL
  Filled 2018-03-22 (×7): qty 1

## 2018-03-22 MED ORDER — SODIUM CHLORIDE 0.9 % IV BOLUS
500.0000 mL | Freq: Once | INTRAVENOUS | Status: AC
  Administered 2018-03-22: 500 mL via INTRAVENOUS

## 2018-03-22 MED ORDER — ONDANSETRON HCL 4 MG/2ML IJ SOLN: 4.0000 mg | Freq: Four times a day (QID) | INTRAMUSCULAR | Status: DC | PRN

## 2018-03-22 MED ORDER — CLORAZEPATE DIPOTASSIUM 3.75 MG PO TABS: 7.5000 mg | ORAL_TABLET | Freq: Three times a day (TID) | ORAL | Status: DC | PRN

## 2018-03-22 MED ORDER — HYDROMORPHONE HCL 1 MG/ML IJ SOLN: 0.5000 mg | INTRAMUSCULAR | Status: DC | PRN

## 2018-03-22 MED ORDER — ONDANSETRON 4 MG PO TBDP: 4.0000 mg | ORAL_TABLET | Freq: Four times a day (QID) | ORAL | Status: DC | PRN

## 2018-03-22 MED ORDER — SODIUM CHLORIDE 0.9 % IV SOLN
INTRAVENOUS | Status: DC
  Administered 2018-03-23: 01:00:00 via INTRAVENOUS

## 2018-03-22 MED ORDER — SODIUM CHLORIDE 0.9% FLUSH: 10.0000 mL | INTRAVENOUS | Status: DC | PRN

## 2018-03-22 MED ORDER — SODIUM CHLORIDE 0.9% IV SOLUTION: Freq: Once | INTRAVENOUS | Status: DC

## 2018-03-22 MED ORDER — PRAVASTATIN SODIUM 40 MG PO TABS
40.0000 mg | ORAL_TABLET | Freq: Every day | ORAL | Status: DC
  Administered 2018-03-22 – 2018-03-23 (×2): 40 mg via ORAL
  Filled 2018-03-22 (×2): qty 1

## 2018-03-22 MED ORDER — ACETAMINOPHEN 650 MG RE SUPP: 650.0000 mg | Freq: Four times a day (QID) | RECTAL | Status: DC | PRN

## 2018-03-22 MED ORDER — METOPROLOL TARTRATE 25 MG PO TABS
25.0000 mg | ORAL_TABLET | Freq: Two times a day (BID) | ORAL | Status: DC
  Administered 2018-03-22 – 2018-03-24 (×4): 25 mg via ORAL
  Filled 2018-03-22 (×4): qty 1

## 2018-03-22 MED ORDER — SIMETHICONE 80 MG PO CHEW: 40.0000 mg | CHEWABLE_TABLET | Freq: Four times a day (QID) | ORAL | Status: DC | PRN

## 2018-03-22 MED ORDER — PRAMIPEXOLE DIHYDROCHLORIDE 0.25 MG PO TABS
0.2500 mg | ORAL_TABLET | Freq: Every day | ORAL | Status: DC
  Administered 2018-03-22 – 2018-03-23 (×2): 0.25 mg via ORAL
  Filled 2018-03-22 (×3): qty 1

## 2018-03-22 MED ORDER — FENTANYL CITRATE (PF) 100 MCG/2ML IJ SOLN
50.0000 ug | Freq: Once | INTRAMUSCULAR | Status: AC
  Administered 2018-03-22: 50 ug via INTRAVENOUS
  Filled 2018-03-22: qty 2

## 2018-03-22 MED ORDER — POTASSIUM CHLORIDE 20 MEQ PO PACK
20.0000 meq | PACK | Freq: Once | ORAL | Status: DC
  Filled 2018-03-22: qty 1

## 2018-03-22 MED ORDER — ACETAMINOPHEN 325 MG PO TABS: 650.0000 mg | ORAL_TABLET | Freq: Four times a day (QID) | ORAL | Status: DC | PRN

## 2018-03-22 MED ORDER — LEVOTHYROXINE SODIUM 75 MCG PO TABS
75.0000 ug | ORAL_TABLET | Freq: Every day | ORAL | Status: DC
  Administered 2018-03-23 – 2018-03-24 (×2): 75 ug via ORAL
  Filled 2018-03-22 (×2): qty 1

## 2018-03-22 NOTE — Progress Notes (Signed)
Peripherally Inserted Central Catheter/Midline Placement  The IV Nurse has discussed with the patient and/or persons authorized to consent for the patient, the purpose of this procedure and the potential benefits and risks involved with this procedure.  The benefits include less needle sticks, lab draws from the catheter, and the patient may be discharged home with the catheter. Risks include, but not limited to, infection, bleeding, blood clot (thrombus formation), and puncture of an artery; nerve damage and irregular heartbeat and possibility to perform a PICC exchange if needed/ordered by physician.  Alternatives to this procedure were also discussed.  Bard Power PICC patient education guide, fact sheet on infection prevention and patient information card has been provided to patient /or left at bedside.    PICC/Midline Placement Documentation  PICC Double Lumen 03/22/18 PICC Right Brachial 34 cm 0 cm (Active)  Indication for Insertion or Continuance of Line Poor Vasculature-patient has had multiple peripheral attempts or PIVs lasting less than 24 hours 03/22/2018  6:40 PM  Exposed Catheter (cm) 0 cm 03/22/2018  6:40 PM  Site Assessment Clean;Dry;Intact 03/22/2018  6:40 PM  Lumen #1 Status Flushed;Saline locked;Blood return noted 03/22/2018  6:40 PM  Lumen #2 Status Flushed;Saline locked;Blood return noted 03/22/2018  6:40 PM  Dressing Type Transparent 03/22/2018  6:40 PM  Dressing Status Clean;Dry;Intact;Antimicrobial disc in place 03/22/2018  6:40 PM  Dressing Intervention New dressing 03/22/2018  6:40 PM  Dressing Change Due 03/29/18 03/22/2018  6:40 PM       Everline Mahaffy, Nicolette Bang 03/22/2018, 6:41 PM

## 2018-03-22 NOTE — ED Provider Notes (Signed)
[facility] EMERGENCY DEPARTMENT Provider Note   CSN: [id number] Arrival date & time: 03/22/18  1214     History   Chief Complaint Chief Complaint  Patient presents with  . Rectal Bleeding    HPI Ana Santos is a 77 y.o. female who recently underwent a transurethral resection of a bladder tumor by Dr. Junious Silk on 8/30 as well as a laparoscopic assisted right colectomy for a cecal mass (with end to end anastomosis) by Dr. Excell Seltzer on 10/3 who presents emergency department today for rectal bleeding.  Patient reports that after the surgery she did have some rectal bleeding while inpatient and required transfusion.  She was discharged in good condition home 2 days ago.  She reports no rectal bleeding yesterday.  She reports this morning she started having a bright red blood per the rectum.  She describes this as dark red/maroon in color x  3 occurrences.  She describes them as "gushes of blood".  She does report she has some generalized abdominal pain similar to that she has had after the surgery.  She notes it is more severe on the right side but not more severe then normal.  No complications of incisions from the surgery/staples.  She denies any fever, chest pain, nausea/vomiting.  Patient is not on any blood thinners.  Was previously on Plavix but stopped 5 days prior to surgery.  She restarted this yesterday.  She reports generalized weakness but denies dizziness, lightheadedness, syncope.   HPI  Past Medical History:  Diagnosis Date  . AN (acoustic neuroma) ([place])   . Aneurysm ([place])   . Anxiety   . Cataract   . Cerebrovascular disease   . Cigarette smoker   . COPD (chronic obstructive pulmonary disease) (HCC)    mild, no inhalers or oxygen used  . Coronary artery disease    no current cardiologist  pt. denies  . Deafness in right ear   . Depression   . Headache    tension headaches due to aneurysm repair with stent  . Heart murmur   . History of kidney  stones    15 to 20 yrs ago  . Hyperlipidemia   . Hypothyroidism   . IBS (irritable bowel syndrome)   . Mitral valve prolapse    mild takes beta blocker  . Osteopenia   . Palpitations   . Stroke ([place])    x 3 ministrokes last one feb 2014  . Venous insufficiency   . Vitamin D deficiency     Patient Active Problem List   Diagnosis Date Noted  . Colonic mass s/p lap ileocectomy 03/14/2018 03/14/2018  . Acute lower GI bleeding 03/14/2018  . Chronic anticoagulation 03/14/2018  . Chronic tension-type headache, not intractable 05/31/2015  . Cephalalgia 02/22/2015  . Acute cystitis 01/04/2015  . Symptomatic PVCs 08/04/2014  . Chest pain 08/03/2014  . Chronic headaches 08/03/2014  . Cerebellar artery occlusion 03/11/2014  . TIA (transient ischemic attack) 08/26/2013  . Stroke ([place]) 08/12/2013  . Palpitations 07/09/2013  . Restless legs syndrome (RLS) 07/04/2012  . Cerebral artery occlusion with cerebral infarction ([place]) 03/30/2010  . GENERALIZED PAIN 03/28/2010  . Cerebrovascular disease 07/13/2009  . ANEURYSM 03/26/2009  . Vitamin D deficiency 08/10/2008  . VENOUS INSUFFICIENCY 08/10/2008  . Disorder of bone and cartilage 12/18/2007  . INSOMNIA 09/16/2007  . 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[place]) 08/18/2007  . Hypothyroidism 08/18/2007  . CIGARETTE SMOKER 08/18/2007  . Hyperlipidemia 07/18/2007  . Anxiety state 07/18/2007  .  Mitral valve disorder 07/18/2007  . COPD (chronic obstructive pulmonary disease) with chronic bronchitis ([place]) 07/18/2007  . IRRITABLE BOWEL SYNDROME 07/18/2007  . NEPHROLITHIASIS 07/18/2007  . FIBROMYALGIA 07/18/2007    Past Surgical History:  Procedure Laterality Date  . ANEURYSM COILING  11-10   Dr. Estanislado Pandy  . BREAST LUMPECTOMY Right 1986   benign  . DIAGNOSTIC LAPAROSCOPY     Proable laparoscopic right colectomy 03-14-18 Dr. Excell Seltzer  . EYE SURGERY Bilateral    ioc lens for cataracts  . LAPAROSCOPIC RIGHT COLECTOMY Right 03/14/2018    Procedure: LAPAROSCOPIC ASSISTED  RIGHT COLECTOMY;  Surgeon: Excell Seltzer, MD;  Location: [facility];  Service: General;  Laterality: Right;  . LAPAROSCOPY N/A 03/14/2018   Procedure: LAPAROSCOPY;  Surgeon: Excell Seltzer, MD;  [facility];  Service: General;  Laterality: N/A;  . LEFT HEART CATHETERIZATION WITH CORONARY ANGIOGRAM N/A 08/05/2014   Procedure: LEFT HEART CATHETERIZATION WITH CORONARY ANGIOGRAM;  Surgeon: Wellington Hampshire, MD;  Location: [facility];  Service: Cardiovascular;  Laterality: N/A;  . TONSILLECTOMY    . TOTAL ABDOMINAL HYSTERECTOMY  1970   complete  . TRANSLABYRINTHINE PROCEDURE  2002   [facility] Dr. Vicie Mutters tumor removal  . TRANSURETHRAL RESECTION OF BLADDER TUMOR N/A 02/08/2018   Procedure: TRANSURETHRAL RESECTION OF BLADDER TUMOR (TURBT)/ POSTOPERATIVE INSTILLATION OF CHEMO THERAPY;  Surgeon: Festus Aloe, MD;  Location: [facility];  Service: Urology;  Laterality: N/A;     OB History   None      Home Medications    Prior to Admission medications   Medication Sig Start Date End Date Taking? Authorizing Provider  acetaminophen (TYLENOL) 500 MG tablet Take 500 mg by mouth every 6 (six) hours as needed for mild pain.     [provider]  aspirin 325 MG tablet Take 325 mg by mouth daily.      [provider]  cholecalciferol (VITAMIN D) 1000 UNITS tablet Take 1,000 Units by mouth daily.      [provider]  clopidogrel (PLAVIX) 75 MG tablet TAKE 1 TABLET DAILY Patient taking differently: Take 75 mg by mouth daily.  10/17/17   Noralee Space, MD  clorazepate (TRANXENE) 7.5 MG tablet Take 1 tablet (7.5 mg total) by mouth 3 (three) times daily as needed for anxiety. 03/08/18   Noralee Space, MD  hydroxypropyl methylcellulose / hypromellose (ISOPTO TEARS / GONIOVISC) 2.5 % ophthalmic solution Place 1 drop into both eyes 3 (three) times daily as needed for dry eyes.     [provider]  levothyroxine (SYNTHROID,  LEVOTHROID) 75 MCG tablet TAKE 1 TABLET DAILY BEFORE BREAKFAST Patient taking differently: Take 75 mcg by mouth daily before breakfast.  01/21/18   Noralee Space, MD  metoprolol tartrate (LOPRESSOR) 25 MG tablet TAKE 1 TABLET TWICE A DAY Patient taking differently: Take 25 mg by mouth 2 (two) times daily.  10/17/17   Noralee Space, MD  ondansetron (ZOFRAN) 4 MG tablet Take 1 tablet (4 mg total) by mouth every 8 (eight) hours as needed for nausea or vomiting. Patient not taking: Reported on 03/11/2018 10/31/17   Willia Craze, NP  oxyCODONE (OXY IR/ROXICODONE) 5 MG immediate release tablet Take 1-2 tablets (5-10 mg total) by mouth every 6 (six) hours as needed for severe pain. 40/1/02   Leighton Ruff, MD  pramipexole (MIRAPEX) 0.25 MG tablet TAKE 1 TABLET AT BEDTIME FOR RESTLESS LEG SYMPTOMS Patient taking differently: Take 0.25 mg by mouth at bedtime. TAKE 1 TABLET  AT BEDTIME FOR RESTLESS LEG SYMPTOMS 01/31/18   Noralee Space, MD  pravastatin (PRAVACHOL) 40 MG tablet TAKE 1 TABLET DAILY Patient taking differently: Take 40 mg by mouth at bedtime.  12/10/17   Noralee Space, MD  traMADol (ULTRAM) 50 MG tablet Take 1 tablet (50 mg total) by mouth 3 (three) times daily as needed for moderate pain. 09/19/17   Noralee Space, MD    Family History Family History  Problem Relation Age of Onset  . Heart attack Mother   . Diabetes Mother   . Cancer Father   . Diabetes Sister   . Diabetes Brother   . Cancer Sister        unkown type  . Lung cancer Brother        Lung  . AAA (abdominal aortic aneurysm) Neg Hx   . Colon cancer Neg Hx   . Stomach cancer Neg Hx   . Esophageal cancer Neg Hx   . Liver cancer Neg Hx   . Pancreatic cancer Neg Hx   . Rectal cancer Neg Hx     Social History Social History   Tobacco Use  . Smoking status: Current Every Day Smoker    Packs/day: 0.25    Years: 50.00    Pack years: 12.50    Types: Cigarettes  . Smokeless tobacco: Never Used  . Tobacco comment: 4-5  cigs daily  is aware she needs to quit  Substance Use Topics  . Alcohol use: No    Alcohol/week: 0.0 standard drinks  . Drug use: No     Allergies   Atorvastatin; Hydrocodone-acetaminophen; Morphine; Nortriptyline; and Topamax [topiramate]   Review of Systems Review of Systems  All other systems reviewed and are negative.    Physical Exam Updated Vital Signs BP 119/75 (BP Location: Left Arm)   Pulse (!) 111   Temp 98.7 F (37.1 C) (Oral)   Resp 16   SpO2 100%   Physical Exam  Constitutional: She appears well-developed and well-nourished.  Elderly appearing female  HENT:  Head: Normocephalic and atraumatic.  Right Ear: External ear normal.  Left Ear: External ear normal.  Nose: Nose normal.  Mouth/Throat: Uvula is midline, oropharynx is clear and moist and mucous membranes are normal. No tonsillar exudate.  Eyes: Pupils are equal, round, and reactive to light. Right eye exhibits no discharge. Left eye exhibits no discharge. No scleral icterus.  Neck: Trachea normal. Neck supple. No spinous process tenderness present. No neck rigidity. Normal range of motion present.  Cardiovascular: Normal rate, regular rhythm and intact distal pulses.  No murmur heard. Pulses:      Radial pulses are 2+ on the right side, and 2+ on the left side.       Dorsalis pedis pulses are 2+ on the right side, and 2+ on the left side.       Posterior tibial pulses are 2+ on the right side, and 2+ on the left side.  No lower extremity swelling or edema. Calves symmetric in size bilaterally.  Pulmonary/Chest: Effort normal and breath sounds normal. She exhibits no tenderness.  Abdominal: Soft. Bowel sounds are normal. There is generalized tenderness. There is no rigidity, no rebound and no guarding.  Postsurgical scars appear to be well-healing with staples in place.  No surrounding evidence of infection.  No dehiscence.  Mild generalized abdominal pain with light palpation is more severe on the  right side.  No peritoneal signs.  No rigidity, rebound or guarding.  Genitourinary:  Genitourinary Comments: Chaperone present.  DRE reveals gross blood per rectum.  Musculoskeletal: She exhibits no edema.  Lymphadenopathy:    She has no cervical adenopathy.  Neurological: She is alert.  Skin: Skin is warm and dry. No rash noted. She is not diaphoretic.  Psychiatric: She has a normal mood and affect.  Nursing note and vitals reviewed.    ED Treatments / Results  Labs (all labs ordered are listed, but only abnormal results are displayed) Labs Reviewed  COMPREHENSIVE METABOLIC PANEL - Abnormal; Notable for the following components:      Result Value   Sodium 146 (*)    Potassium 3.1 (*)    Chloride 112 (*)    Glucose, Bld 139 (*)    Albumin 3.3 (*)    GFR calc non Af Amer 59 (*)    All other components within normal limits  CBC - Abnormal; Notable for the following components:   WBC 13.8 (*)    RBC 3.34 (*)    Hemoglobin 9.3 (*)    HCT 31.2 (*)    MCHC 29.8 (*)    Platelets 471 (*)    All other components within normal limits  DIFFERENTIAL - Abnormal; Notable for the following components:   Neutro Abs 10.2 (*)    All other components within normal limits  PROTIME-INR  POC OCCULT BLOOD, ED  I-STAT CHEM 8, ED  TYPE AND SCREEN  ABO/RH    EKG None  Radiology No results found.  Procedures Procedures (including critical care time)  Medications Ordered in ED Medications  sodium chloride 0.9 % bolus 500 mL (has no administration in time range)  fentaNYL (SUBLIMAZE) injection 50 mcg (has no administration in time range)  sodium chloride 0.9 % bolus 500 mL (has no administration in time range)     Initial Impression / Assessment and Plan / ED Course  I have reviewed the triage vital signs and the nursing notes.  Pertinent labs & imaging results that were available during my care of the patient were reviewed by me and considered in my medical decision making (see  chart for details).     77 y.o. female who recently underwent a transurethral resection of a bladder tumor by Dr. Junious Silk on 8/30 as well as a laparoscopic assisted right colectomy for a cecal mass (with end to end anastomosis) by Dr. Excell Seltzer on 10/3 who presents emergency department today for rectal bleeding.  Patient was discharged 2 days ago in good condition.  After surgery she did have some rectal bleeding that required transfusion.  She reports she just started her Plavix yesterday.  This morning she had 3 episodes of bright red blood per the rectum and has generalized weakness.  On arrival patient is without hypotension.  She has tachycardia at 110.  She reports some generalized abdominal pain that has been typical for her after surgery.  No increased abdominal tenderness.  On abdominal exam there is very mild tenderness throughout.  No peritoneal signs.  Surgical incisions appear to be well-healing.  No surrounding evidence of infection or dehiscence.  Rectal exam with gross blood.  Fecal occult positive.  Patient given IV fluids.  Blood work reviewed.  Patient's anemia has improved since discharge. No indication for emergent transfusion. General Surgery consulted and will admit the patient for observation.  Final Clinical Impressions(s) / ED Diagnoses   Final diagnoses:  Rectal bleeding    ED Discharge Orders    None       Alferd Apa  M, PA-C 03/22/18 1507    Nat Christen, MD 03/23/18 [phone]

## 2018-03-22 NOTE — ED Notes (Signed)
Small amount of maroon colored thick stool cleaned from the pt

## 2018-03-22 NOTE — ED Triage Notes (Signed)
Pt presents for evaluation of rectal bleeding. States this morning around 0500 starting having large amounts of dark red blood in stool. States went to PCP and had 2 more episodes since then. States had recent bladder and colon surgery last week.

## 2018-03-22 NOTE — H&P (Addendum)
Ana Santos is an 77 y.o. female.   Chief Complaint: brb[r HPI: 50 yof who underwent lap assisted ileocecectomy on 10/3 by Dr Excell Seltzer. Path was benign. She had a large amount of clot with low bp immediately postop and was transferred to icu.  Had question of stroke and underwent code stroke that was negative.  She did receive blood. Neurology saw her. Had nl eeg. Nl mri.  Questionable seizure.  She then resolved bleed and was eventually discharged to home on 10/8.  She restarted her plavix last night and this am had large amount brbpr times two.  She was brought to the er and the hct is 31.2.  Past Medical History:  Diagnosis Date  . AN (acoustic neuroma) ([place])   . Aneurysm ([place])   . Anxiety   . Cataract   . Cerebrovascular disease   . Cigarette smoker   . COPD (chronic obstructive pulmonary disease) (H[facility])    mild, no inhalers or oxygen used  . Coronary artery disease    no current cardiologist  pt. denies  . Deafness in right ear   . Depression   . Headache    tension headaches due to aneurysm repair with stent  . Heart murmur   . History of kidney stones    15 to 20 yrs ago  . Hyperlipidemia   . Hypothyroidism   . IBS (irritable bowel syndrome)   . Mitral valve prolapse    mild takes beta blocker  . Osteopenia   . Palpitations   . Stroke ([place])    x 3 ministrokes last one feb 2014  . Venous insufficiency   . Vitamin D deficiency     Past Surgical History:  Procedure Laterality Date  . ANEURYSM COILING  11-10   Dr. Estanislado Pandy  . BREAST LUMPECTOMY Right 1986   benign  . DIAGNOSTIC LAPAROSCOPY     Proable laparoscopic right colectomy 03-14-18 Dr. Excell Seltzer  . EYE SURGERY Bilateral    ioc lens for cataracts  . LAPAROSCOPIC RIGHT COLECTOMY Right 03/14/2018   Procedure: LAPAROSCOPIC ASSISTED  RIGHT COLECTOMY;  Surgeon: Excell Seltzer, MD;  [facility];  Service: General;  Laterality: Right;  . LAPAROSCOPY N/A 03/14/2018   Procedure: LAPAROSCOPY;  Surgeon:  Excell Seltzer, MD;  [facility];  Service: General;  Laterality: N/A;  . LEFT HEART CATHETERIZATION WITH CORONARY ANGIOGRAM N/A 08/05/2014   Procedure: LEFT HEART CATHETERIZATION WITH CORONARY ANGIOGRAM;  Surgeon: Wellington Hampshire, MD;  Location: [facility];  Service: Cardiovascular;  Laterality: N/A;  . TONSILLECTOMY    . TOTAL ABDOMINAL HYSTERECTOMY  1970   complete  . TRANSLABYRINTHINE PROCEDURE  2002   [facility] Dr. Vicie Mutters tumor removal  . TRANSURETHRAL RESECTION OF BLADDER TUMOR N/A 02/08/2018   Procedure: TRANSURETHRAL RESECTION OF BLADDER TUMOR (TURBT)/ POSTOPERATIVE INSTILLATION OF CHEMO THERAPY;  Surgeon: Festus Aloe, MD;  Location: [facility];  Service: Urology;  Laterality: N/A;    Family History  Problem Relation Age of Onset  . Heart attack Mother   . Diabetes Mother   . Cancer Father   . Diabetes Sister   . Diabetes Brother   . Cancer Sister        unkown type  . Lung cancer Brother        Lung  . AAA (abdominal aortic aneurysm) Neg Hx   . Colon cancer Neg Hx   . Stomach cancer Neg Hx   . Esophageal cancer Neg Hx   . Liver cancer Neg  Hx   . Pancreatic cancer Neg Hx   . Rectal cancer Neg Hx    Social History:  reports that she has been smoking cigarettes. She has a 12.50 pack-year smoking history. She has never used smokeless tobacco. She reports that she does not drink alcohol or use drugs.  Allergies:  Allergies  Allergen Reactions  . Atorvastatin Other (See Comments)     Lipitor caused arm pain (3/09)  . Hydrocodone-Acetaminophen Other (See Comments)    hallucinations  . Morphine Other (See Comments)    hallucinations  . Nortriptyline     Caused nausea and vomiting and shaking, kept her up all night  . Topamax [Topiramate] Nausea And Vomiting    *dizziness*    meds reviewed  Results for orders placed or performed during the hospital encounter of 03/22/18 (from the past 48 hour(s))  Type and screen [place]      Status: None   Collection Time: 03/22/18 12:30 PM  Result Value Ref Range   ABO/RH(D) A POS    Antibody Screen NEG    Sample Expiration      03/25/2018 Performed at [facility], [place] [address], [place], [place] [address]   ABO/Rh     Status: None   Collection Time: 03/22/18 12:30 PM  Result Value Ref Range   ABO/RH(D)      A POS Performed at [facility] [address], [place], [place] [address]   Comprehensive metabolic panel     Status: Abnormal   Collection Time: 03/22/18 12:32 PM  Result Value Ref Range   Sodium 146 (H) 135 - 145 mmol/L   Potassium 3.1 (L) 3.5 - 5.1 mmol/L   Chloride 112 (H) 98 - 111 mmol/L   CO2 24 22 - 32 mmol/L   Glucose, Bld 139 (H) 70 - 99 mg/dL   BUN 12 8 - 23 mg/dL   Creatinine, Ser 0.91 0.44 - 1.00 mg/dL   Calcium 9.2 8.9 - 10.3 mg/dL   Total Protein 6.5 6.5 - 8.1 g/dL   Albumin 3.3 (L) 3.5 - 5.0 g/dL   AST 20 15 - 41 U/L   ALT 12 0 - 44 U/L   Alkaline Phosphatase 68 38 - 126 U/L   Total Bilirubin 0.3 0.3 - 1.2 mg/dL   GFR calc non Af Amer 59 (L) >60 mL/min   GFR calc Af Amer >60 >60 mL/min    Comment: (NOTE) The eGFR has been calculated using the CKD EPI equation. This calculation has not been validated in all clinical situations. eGFR's persistently <60 mL/min signify possible Chronic Kidney Disease.    Anion gap 10 5 - 15    Comment: Performed at [facility] [address], [place], [place] [address]  CBC     Status: Abnormal   Collection Time: 03/22/18 12:32 PM  Result Value Ref Range   WBC 13.8 (H) 4.0 - 10.5 K/uL   RBC 3.34 (L) 3.87 - 5.11 MIL/uL   Hemoglobin 9.3 (L) 12.0 - 15.0 g/dL   HCT 31.2 (L) 36.0 - 46.0 %   MCV 93.4 80.0 - 100.0 fL   MCH 27.8 26.0 - 34.0 pg   MCHC 29.8 (L) 30.0 - 36.0 g/dL   RDW 14.0 11.5 - 15.5 %   Platelets 471 (H) 150 - 400 K/uL   nRBC 0.0 0.0 - 0.2 %    Comment: Performed at [facility] [address], [place], [place] [address]  Differential     Status: Abnormal     Collection Time: 03/22/18 12:32 PM  Result Value Ref Range   Neutrophils Relative % 74 %   Neutro Abs 10.2 (H) 1.7 - 7.7 K/uL   Lymphocytes Relative 19 %   Lymphs Abs 2.6 0.7 - 4.0 K/uL   Monocytes Relative 5 %   Monocytes Absolute 0.7 0.1 - 1.0 K/uL   Eosinophils Relative 1 %   Eosinophils Absolute 0.2 0.0 - 0.5 K/uL   Basophils Relative 0 %   Basophils Absolute 0.0 0.0 - 0.1 K/uL    Comment: Performed at [facility] [address] [place], [place] [address]  Protime-INR     Status: None   Collection Time: 03/22/18 12:35 PM  Result Value Ref Range   Prothrombin Time 13.6 11.4 - 15.2 seconds   INR 1.05     Comment: Performed at [facility], [place] [address], [place], [place] [address]   No results found.  Review of Systems  Respiratory: Negative for shortness of breath.   Cardiovascular: Negative for chest pain.  Gastrointestinal: Positive for blood in stool. Negative for abdominal pain, nausea and vomiting.    Blood pressure 119/75, pulse (!) 111, temperature 98.7 F (37.1 C), temperature source Oral, resp. rate 16, SpO2 100 %. Physical Exam  Constitutional: She is oriented to person, place, and time. She appears well-developed and well-nourished.  HENT:  Head: Normocephalic and atraumatic.  Right Ear: External ear normal.  Left Ear: External ear normal.  Eyes: No scleral icterus.  Neck: Neck supple.  Cardiovascular: Normal rate, regular rhythm and normal heart sounds.  Respiratory: Effort normal and breath sounds normal.  GI: Soft. There is no tenderness.    Neurological: She is alert and oriented to person, place, and time.  Skin: Skin is warm and dry.     Assessment/Plan Likely anastomotic bleed related to plavix  She had bleed immediately postop and then again with restarting plavix.  Not currently bleeding.  Will hold plavix and asa.   Likely will stop on its own so wont do any further investigation at this point. Place in stepdown and check  cbc q 6 hours. Discussed plan with patient and her husband.   Rolm Bookbinder, MD 03/22/2018, 2:09 PM    Repeat hct lower and due to bleeding will transfuse, place picc due to access issues and  Repeated blood draws/infusions.

## 2018-03-22 NOTE — ED Notes (Signed)
Report called to rn on 5 w 

## 2018-03-23 LAB — TYPE AND SCREEN
ABO/RH(D): A POS
ANTIBODY SCREEN: NEGATIVE
UNIT DIVISION: 0

## 2018-03-23 LAB — CBC
HCT: 29.1 % — ABNORMAL LOW (ref 36.0–46.0)
HEMATOCRIT: 28 % — AB (ref 36.0–46.0)
Hemoglobin: 9.2 g/dL — ABNORMAL LOW (ref 12.0–15.0)
Hemoglobin: 9.4 g/dL — ABNORMAL LOW (ref 12.0–15.0)
MCH: 29.9 pg (ref 26.0–34.0)
MCH: 29.1 pg (ref 26.0–34.0)
MCHC: 32.9 g/dL (ref 30.0–36.0)
MCHC: 32.3 g/dL (ref 30.0–36.0)
MCV: 90.9 fL (ref 80.0–100.0)
MCV: 90.1 fL (ref 80.0–100.0)
NRBC: 0 % (ref 0.0–0.2)
NRBC: 0 % (ref 0.0–0.2)
PLATELETS: 361 10*3/uL (ref 150–400)
Platelets: 344 10*3/uL (ref 150–400)
RBC: 3.08 MIL/uL — ABNORMAL LOW (ref 3.87–5.11)
RBC: 3.23 MIL/uL — AB (ref 3.87–5.11)
RDW: 13.7 % (ref 11.5–15.5)
RDW: 13.8 % (ref 11.5–15.5)
WBC: 10.4 10*3/uL (ref 4.0–10.5)
WBC: 10.5 10*3/uL (ref 4.0–10.5)

## 2018-03-23 LAB — BASIC METABOLIC PANEL
Anion gap: 6 (ref 5–15)
BUN: 7 mg/dL — ABNORMAL LOW (ref 8–23)
CHLORIDE: 110 mmol/L (ref 98–111)
CO2: 26 mmol/L (ref 22–32)
CREATININE: 0.73 mg/dL (ref 0.44–1.00)
Calcium: 8.4 mg/dL — ABNORMAL LOW (ref 8.9–10.3)
Glucose, Bld: 86 mg/dL (ref 70–99)
Potassium: 2.9 mmol/L — ABNORMAL LOW (ref 3.5–5.1)
SODIUM: 142 mmol/L (ref 135–145)

## 2018-03-23 LAB — BPAM RBC
BLOOD PRODUCT EXPIRATION DATE: 201911022359
ISSUE DATE / TIME: 201910111848
UNIT TYPE AND RH: 6200

## 2018-03-23 MED ORDER — ENSURE ENLIVE PO LIQD
237.0000 mL | Freq: Two times a day (BID) | ORAL | Status: DC
  Administered 2018-03-23 – 2018-03-24 (×3): 237 mL via ORAL

## 2018-03-23 MED ORDER — POTASSIUM CHLORIDE 10 MEQ/100ML IV SOLN
INTRAVENOUS | Status: AC
  Filled 2018-03-23: qty 100

## 2018-03-23 MED ORDER — POTASSIUM CHLORIDE 10 MEQ/50ML IV SOLN
10.0000 meq | INTRAVENOUS | Status: AC
  Administered 2018-03-23 (×5): 10 meq via INTRAVENOUS
  Filled 2018-03-23 (×5): qty 50

## 2018-03-23 NOTE — Progress Notes (Signed)
PT Cancellation Note  Patient Details Name: Ana Santos MRN: [id number] DOB: 04-Nov-1940   Cancelled Treatment:    Reason Eval/Treat Not Completed: PT screened, no needs identified, will sign off.  Patient reports no issues with gait or balance.  No recent falls.  Politely declined PT evaluation.  Will sign off.   Shanna Cisco 03/23/2018, 3:50 PM

## 2018-03-23 NOTE — Progress Notes (Signed)
  [facility] Progress Note     Subjective: CC-  Feeling ok this morning. States that she had a few bright red bloody BMs over night, but her most recent BM this morning had less blood. Denies n/v. States that she's hungry. Asking when she can go home.  Objective: Vital signs in last 24 hours: Temp:  [97.1 F (36.2 C)-98.7 F (37.1 C)] 98.4 F (36.9 C) (10/12 0040) Pulse Rate:  [56-111] 66 (10/12 0713) Resp:  [13-25] 23 (10/12 0713) BP: (111-178)/(58-105) 157/78 (10/12 0713) SpO2:  [93 %-100 %] 97 % (10/12 0713) Weight:  [51.3 kg] 51.3 kg (10/11 1703) Last BM Date: 03/22/18  Intake/Output from previous day: 10/11 0701 - 10/12 0700 In: 543.3 [P.O.:120; I.V.:108.3; Blood:315] Out: 150 [Urine:150] Intake/Output this shift: No intake/output data recorded.  PE: Gen:  Alert, NAD, pleasant HEENT: EOM's intact, pupils equal and round Card:  RRR Pulm:  CTAB, no W/R/R, effort normal Abd: Soft, ND, mild diffuse TTP, +BS, midline incision cdi with staples intact and no erythema or drainage Psych: A&Ox3  Skin: no rashes noted, warm and dry  Lab Results:  Recent Labs    03/23/18 0054 03/23/18 0626  WBC 10.4 10.5  HGB 9.2* 9.4*  HCT 28.0* 29.1*  PLT 344 361   BMET Recent Labs    03/22/18 1232 03/22/18 1544 03/23/18 0626  NA 146* 146* 142  K 3.1* 3.0* 2.9*  CL 112* 112* 110  CO2 24  --  26  GLUCOSE 139* 80 86  BUN 12 12 7*  CREATININE 0.91 0.70 0.73  CALCIUM 9.2  --  8.4*   PT/INR Recent Labs    03/22/18 1235  LABPROT 13.6  INR 1.05   CMP     Component Value Date/Time   NA 142 03/23/2018 0626   K 2.9 (L) 03/23/2018 0626   CL 110 03/23/2018 0626   CO2 26 03/23/2018 0626   GLUCOSE 86 03/23/2018 0626   BUN 7 (L) 03/23/2018 0626   CREATININE 0.73 03/23/2018 0626   CALCIUM 8.4 (L) 03/23/2018 0626   PROT 6.5 03/22/2018 1232   ALBUMIN 3.3 (L) 03/22/2018 1232   AST 20 03/22/2018 1232   ALT 12 03/22/2018 1232   ALKPHOS 68 03/22/2018 1232   BILITOT 0.3 03/22/2018 1232   GFRNONAA >60 03/23/2018 0626   GFRAA >60 03/23/2018 0626   Lipase     Component Value Date/Time   LIPASE 23 12/17/2015 1154       Studies/Results: [place] Ekg Site Rite  Result Date: 03/22/2018 If Site Rite image not attached, placement could not be confirmed due to current cardiac rhythm.   Anti-infectives: Anti-infectives (From admission, onward)   None       Assessment/Plan  S/p laparoscopic ileocecectomy 10/3 Dr. Excell Seltzer Likely anastomotic bleed related to plavix - Patient noting less blood in stools, Hg stable now 9.4 today from 9.2, VSS. Continue to hold Plavix. Advance diet to regular and continue monitoring stools. Replace potassium. Consult PT/OT.   ID - none FEN - IVF, reg diet, Ensure VTE - SCDs, no chemical DVT prophylaxis due to bleeding Foley - none   LOS: 1 day    Wellington Hampshire , [facility] 03/23/2018, 8:31 AM Pager: [phone] Mon 7:00 am -11:30 AM Tues-Fri 7:00 am-4:30 pm Sat-Sun 7:00 am-11:30 am

## 2018-03-24 LAB — CBC
HCT: 28.5 % — ABNORMAL LOW (ref 36.0–46.0)
HCT: 30.7 % — ABNORMAL LOW (ref 36.0–46.0)
Hemoglobin: 8.9 g/dL — ABNORMAL LOW (ref 12.0–15.0)
Hemoglobin: 9.7 g/dL — ABNORMAL LOW (ref 12.0–15.0)
MCH: 28.8 pg (ref 26.0–34.0)
MCH: 29 pg (ref 26.0–34.0)
MCHC: 31.2 g/dL (ref 30.0–36.0)
MCHC: 31.6 g/dL (ref 30.0–36.0)
MCV: 92.2 fL (ref 80.0–100.0)
MCV: 91.9 fL (ref 80.0–100.0)
PLATELETS: 362 10*3/uL (ref 150–400)
PLATELETS: 375 10*3/uL (ref 150–400)
RBC: 3.09 MIL/uL — ABNORMAL LOW (ref 3.87–5.11)
RBC: 3.34 MIL/uL — AB (ref 3.87–5.11)
RDW: 14.1 % (ref 11.5–15.5)
RDW: 14 % (ref 11.5–15.5)
WBC: 10.9 10*3/uL — ABNORMAL HIGH (ref 4.0–10.5)
WBC: 11.6 10*3/uL — AB (ref 4.0–10.5)
nRBC: 0 % (ref 0.0–0.2)
nRBC: 0 % (ref 0.0–0.2)

## 2018-03-24 MED ORDER — HEPARIN SOD (PORK) LOCK FLUSH 100 UNIT/ML IV SOLN: 250.0000 [IU] | INTRAVENOUS | Status: DC | PRN

## 2018-03-24 NOTE — Progress Notes (Addendum)
  [facility] Progress Note     Subjective: CC-  Patient states that she is feeling better than yesterday. Abdominal pain improving. Tolerating diet. Denies n/v. No issues ambulating around unit yesterday. 1 BM yesterday that was still a little dark/blood-tinged but less frank blood. Hg 8.9 from 9.4.  Objective: Vital signs in last 24 hours: Temp:  [98.6 F (37 C)-99.1 F (37.3 C)] 99.1 F (37.3 C) (10/13 0506) Pulse Rate:  [58-81] 62 (10/13 0547) Resp:  [16-27] 19 (10/13 0547) BP: (116-149)/(59-119) 138/59 (10/13 0547) SpO2:  [93 %-97 %] 93 % (10/13 0547) Last BM Date: 03/24/18  Intake/Output from previous day: 10/12 0701 - 10/13 0700 In: 541.1 [P.O.:320; IV Piggyback:221.1] Out: 450 [Urine:450] Intake/Output this shift: No intake/output data recorded.  PE: Gen:  Alert, NAD, pleasant HEENT: EOM's intact, pupils equal and round Card:  RRR Pulm:  CTAB, no W/R/R, effort normal Abd: Soft, ND, nontender, +BS, midline incision cdi with staples intact and no erythema or drainage Psych: A&Ox3  Skin: no rashes noted, warm and dry  Lab Results:  Recent Labs    03/23/18 0626 03/24/18 0423  WBC 10.5 10.9*  HGB 9.4* 8.9*  HCT 29.1* 28.5*  PLT 361 362   BMET Recent Labs    03/22/18 1232 03/22/18 1544 03/23/18 0626  NA 146* 146* 142  K 3.1* 3.0* 2.9*  CL 112* 112* 110  CO2 24  --  26  GLUCOSE 139* 80 86  BUN 12 12 7*  CREATININE 0.91 0.70 0.73  CALCIUM 9.2  --  8.4*   PT/INR Recent Labs    03/22/18 1235  LABPROT 13.6  INR 1.05   CMP     Component Value Date/Time   NA 142 03/23/2018 0626   K 2.9 (L) 03/23/2018 0626   CL 110 03/23/2018 0626   CO2 26 03/23/2018 0626   GLUCOSE 86 03/23/2018 0626   BUN 7 (L) 03/23/2018 0626   CREATININE 0.73 03/23/2018 0626   CALCIUM 8.4 (L) 03/23/2018 0626   PROT 6.5 03/22/2018 1232   ALBUMIN 3.3 (L) 03/22/2018 1232   AST 20 03/22/2018 1232   ALT 12 03/22/2018 1232   ALKPHOS 68 03/22/2018 1232   BILITOT  0.3 03/22/2018 1232   GFRNONAA >60 03/23/2018 0626   GFRAA >60 03/23/2018 0626   Lipase     Component Value Date/Time   LIPASE 23 12/17/2015 1154       Studies/Results: [place] Ekg Site Rite  Result Date: 03/22/2018 If Site Rite image not attached, placement could not be confirmed due to current cardiac rhythm.   Anti-infectives: Anti-infectives (From admission, onward)   None       Assessment/Plan S/p laparoscopic ileocecectomy 10/3 Dr. Excell Seltzer Likely anastomotic bleed related to plavix - POD 10 - Hg slightly down from yesterday morning, and less dark/blood-tinged stools. D/c IVF and monitor stools. repeat CBC at 1300 today, if H&H stable she may be discharged this afternoon. D/c staples prior to discharge. F/u 1 week with Dr. Excell Seltzer. I have emailed Dr. Lear Ng nurse to arrange appointment.  ID - none FEN - d/c IVF, reg diet, Ensure VTE - SCDs, no chemical DVT prophylaxis due to bleeding Foley - none   LOS: 2 days    Wellington Hampshire , [facility] 03/24/2018, 8:31 AM Pager: [phone] Mon 7:00 am -11:30 AM Tues-Fri 7:00 am-4:30 pm Sat-Sun 7:00 am-11:30 am

## 2018-03-24 NOTE — Progress Notes (Signed)
Nsg Discharge Note  Admit Date:  03/22/2018 Discharge date: 03/24/2018   Ana Santos to be D/C'd home with her husband per MD order.  AVS completed, given to patient. Patient able to verbalize understanding.  Discharge Medication: Allergies as of 03/24/2018      Reactions   Atorvastatin Other (See Comments)    Lipitor caused arm pain (3/09)   Hydrocodone-acetaminophen Other (See Comments)   hallucinations   Morphine Other (See Comments)   hallucinations   Nortriptyline    Caused nausea and vomiting and shaking, kept her up all night   Topamax [topiramate] Nausea And Vomiting   *dizziness*      Medication List    STOP taking these medications   aspirin 325 MG tablet   clopidogrel 75 MG tablet Commonly known as:  PLAVIX     TAKE these medications   acetaminophen 500 MG tablet Commonly known as:  TYLENOL Take 500 mg by mouth every 6 (six) hours as needed for mild pain.   cholecalciferol 1000 units tablet Commonly known as:  VITAMIN D Take 1,000 Units by mouth daily.   clorazepate 7.5 MG tablet Commonly known as:  TRANXENE Take 1 tablet (7.5 mg total) by mouth 3 (three) times daily as needed for anxiety.   hydroxypropyl methylcellulose / hypromellose 2.5 % ophthalmic solution Commonly known as:  ISOPTO TEARS / GONIOVISC Place 1 drop into both eyes 2 (two) times daily.   levothyroxine 75 MCG tablet Commonly known as:  SYNTHROID, LEVOTHROID TAKE 1 TABLET DAILY BEFORE BREAKFAST   metoprolol tartrate 25 MG tablet Commonly known as:  LOPRESSOR TAKE 1 TABLET TWICE A DAY   ondansetron 4 MG tablet Commonly known as:  ZOFRAN Take 1 tablet (4 mg total) by mouth every 8 (eight) hours as needed for nausea or vomiting.   oxyCODONE 5 MG immediate release tablet Commonly known as:  Oxy IR/ROXICODONE Take 1-2 tablets (5-10 mg total) by mouth every 6 (six) hours as needed for severe pain.   pramipexole 0.25 MG tablet Commonly known as:  MIRAPEX TAKE 1 TABLET AT  BEDTIME FOR RESTLESS LEG SYMPTOMS What changed:  See the new instructions.   pravastatin 40 MG tablet Commonly known as:  PRAVACHOL TAKE 1 TABLET DAILY What changed:  when to take this   traMADol 50 MG tablet Commonly known as:  ULTRAM Take 1 tablet (50 mg total) by mouth 3 (three) times daily as needed for moderate pain.       Discharge Assessment: Vitals:   03/24/18 0506 03/24/18 0547  BP: (!) 149/119 (!) 138/59  Pulse:  62  Resp:  19  Temp: 99.1 F (37.3 C)   SpO2:  93%   Skin clean, dry and intact without evidence of skin break down, no evidence of skin tears noted. PICC line discontinued by IV team, patient remained on bedrest for 30 minutes after procedure was completed.   D/c Instructions-Education: Discharge instructions given to patient/family with verbalized understanding. D/c education completed with patient/family including follow up instructions, medication list, d/c activities limitations if indicated, with other d/c instructions as indicated by MD - patient able to verbalize understanding, all questions fully answered. Patient instructed to return to ED, call 911, or call MD for any changes in condition.  Patient escorted via [place], and D/C home via private auto.   Ginette Pitman, RN 03/24/2018 3:35 PM

## 2018-03-24 NOTE — Evaluation (Signed)
Occupational Therapy Evaluation Patient Details Name: SPIRIT WERNLI MRN: [id number] DOB: 04-16-1941 Today's Date: 03/24/2018    History of Present Illness Mrs. Chambless is a 77 yo female admitted with rectal bleeding following a  lap assisted ileocecectomy 10/3   Clinical Impression   Pt admitted with above dx and now presenting at baseline level for independence and safety with functional mobility and ADLs. Edu provided re general fall risk reduction strategies at home. No further OT needs identified, OT will sign off.     Follow Up Recommendations  No OT follow up    Equipment Recommendations  None recommended by OT       Precautions / Restrictions Precautions Precautions: None Restrictions Weight Bearing Restrictions: No      Mobility Bed Mobility Overal bed mobility: Independent                Transfers Overall transfer level: Independent Equipment used: None                  Balance Overall balance assessment: Modified Independent                                         ADL either performed or assessed with clinical judgement   ADL Overall ADL's : Independent;At baseline                                             Vision Baseline Vision/History: No visual deficits Patient Visual Report: No change from baseline Vision Assessment?: No apparent visual deficits            Pertinent Vitals/Pain Pain Assessment: No/denies pain     Hand Dominance Right   Extremity/Trunk Assessment Upper Extremity Assessment Upper Extremity Assessment: Overall WFL for tasks assessed   Lower Extremity Assessment Lower Extremity Assessment: Overall WFL for tasks assessed   Cervical / Trunk Assessment Cervical / Trunk Assessment: Normal   Communication Communication Communication: No difficulties   Cognition Arousal/Alertness: Awake/alert Behavior During Therapy: WFL for tasks assessed/performed Overall Cognitive  Status: Within Functional Limits for tasks assessed                                                Home Living Family/patient expects to be discharged to:: Private residence Living Arrangements: Spouse/significant other;Children Available Help at Discharge: Family Type of Home: House Home Access: Stairs to enter     Home Layout: One level     Bathroom Shower/Tub: Teacher, early years/pre: Standard Bathroom Accessibility: Yes How Accessible: Accessible via walker Home Equipment: Shower seat          Prior Functioning/Environment Level of Independence: Independent                          OT Goals(Current goals can be found in the care plan section) Acute Rehab OT Goals Patient Stated Goal: "go home" OT Goal Formulation: All assessment and education complete, DC therapy   AM-PAC PT "6 Clicks" Daily Activity     Outcome Measure Help from another person eating meals?: None Help from another person taking care of  personal grooming?: None Help from another person toileting, which includes using toliet, bedpan, or urinal?: None Help from another person bathing (including washing, rinsing, drying)?: None Help from another person to put on and taking off regular upper body clothing?: None Help from another person to put on and taking off regular lower body clothing?: None 6 Click Score: 24   End of Session Nurse Communication: Mobility status  Activity Tolerance: Patient tolerated treatment well Patient left: in bed;with call bell/phone within reach                   Time: 2780-0447 OT Time Calculation (min): 11 min Charges:  OT General Charges $OT Visit: 1 Visit OT Evaluation $OT Eval Low Complexity: 1 Low   Curtis Sites OTR/L 03/24/2018, 8:54 AM

## 2018-03-25 NOTE — Discharge Summary (Signed)
[facility] Discharge Summary   Patient ID: Ana Santos MRN: [id number] DOB/AGE: Oct 22, 1940 77 y.o.  Admit date: 03/22/2018 Discharge date: 03/24/2018  Admitting Diagnosis: Likely anastomotic bleed related to plavix S/p Laparoscopic ileocecectomy 03/14/18 Dr. Excell Seltzer  Discharge Diagnosis Patient Active Problem List   Diagnosis Date Noted  . Gastrointestinal tract bleed 03/22/2018  . Colonic mass s/p lap ileocectomy 03/14/2018 03/14/2018  . Acute lower GI bleeding 03/14/2018  . Chronic anticoagulation 03/14/2018  . Chronic tension-type headache, not intractable 05/31/2015  . Cephalalgia 02/22/2015  . Acute cystitis 01/04/2015  . Symptomatic PVCs 08/04/2014  . Chest pain 08/03/2014  . Chronic headaches 08/03/2014  . Cerebellar artery occlusion 03/11/2014  . TIA (transient ischemic attack) 08/26/2013  . Stroke ([place]) 08/12/2013  . Palpitations 07/09/2013  . Restless legs syndrome (RLS) 07/04/2012  . Cerebral artery occlusion with cerebral infarction ([place]) 03/30/2010  . GENERALIZED PAIN 03/28/2010  . Cerebrovascular disease 07/13/2009  . ANEURYSM 03/26/2009  . Vitamin D deficiency 08/10/2008  . VENOUS INSUFFICIENCY 08/10/2008  . Disorder of bone and cartilage 12/18/2007  . INSOMNIA 09/16/2007  . 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[place]) 08/18/2007  . Hypothyroidism 08/18/2007  . CIGARETTE SMOKER 08/18/2007  . Hyperlipidemia 07/18/2007  . Anxiety state 07/18/2007  . Mitral valve disorder 07/18/2007  . COPD (chronic obstructive pulmonary disease) with chronic bronchitis ([place]) 07/18/2007  . IRRITABLE BOWEL SYNDROME 07/18/2007  . NEPHROLITHIASIS 07/18/2007  . FIBROMYALGIA 07/18/2007    Consultants None  Imaging: No results found.  Procedures None this admission  Hospital Course:  Ana Santos is a 77yo female who underwent lap assisted ileocecectomy on 10/3 by Dr Excell Seltzer; path was benign. After surgery she did have some rectal bleeding that  required transfusion.  Bleeding resolved and patient was eventually discharged to home on 10/8.  She restarted her plavix 10/10 and on 10/11 she had large amount brbpr times two.  She was brought to the er and the hct is 31.2. Plavix and aspirin were held and patient was admitted to the surgical service for monitoring. Hemoglobin stabilized and rectal bleeding resolved. Diet was advanced as tolerated. On 10/13 the patient was voiding well, tolerating diet, ambulating well, pain well controlled, vital signs stable and felt stable for discharge home.  Patient will follow up as below and knows to call with questions or concerns.     Allergies as of 03/24/2018      Reactions   Atorvastatin Other (See Comments)    Lipitor caused arm pain (3/09)   Hydrocodone-acetaminophen Other (See Comments)   hallucinations   Morphine Other (See Comments)   hallucinations   Nortriptyline    Caused nausea and vomiting and shaking, kept her up all night   Topamax [topiramate] Nausea And Vomiting   *dizziness*      Medication List    STOP taking these medications   aspirin 325 MG tablet   clopidogrel 75 MG tablet Commonly known as:  PLAVIX     TAKE these medications   acetaminophen 500 MG tablet Commonly known as:  TYLENOL Take 500 mg by mouth every 6 (six) hours as needed for mild pain.   cholecalciferol 1000 units tablet Commonly known as:  VITAMIN D Take 1,000 Units by mouth daily.   clorazepate 7.5 MG tablet Commonly known as:  TRANXENE Take 1 tablet (7.5 mg total) by mouth 3 (three) times daily as needed for anxiety.   hydroxypropyl methylcellulose / hypromellose 2.5 % ophthalmic solution Commonly known as:  ISOPTO TEARS / GONIOVISC  Place 1 drop into both eyes 2 (two) times daily.   levothyroxine 75 MCG tablet Commonly known as:  SYNTHROID, LEVOTHROID TAKE 1 TABLET DAILY BEFORE BREAKFAST   metoprolol tartrate 25 MG tablet Commonly known as:  LOPRESSOR TAKE 1 TABLET TWICE A DAY    ondansetron 4 MG tablet Commonly known as:  ZOFRAN Take 1 tablet (4 mg total) by mouth every 8 (eight) hours as needed for nausea or vomiting.   oxyCODONE 5 MG immediate release tablet Commonly known as:  Oxy IR/ROXICODONE Take 1-2 tablets (5-10 mg total) by mouth every 6 (six) hours as needed for severe pain.   pramipexole 0.25 MG tablet Commonly known as:  MIRAPEX TAKE 1 TABLET AT BEDTIME FOR RESTLESS LEG SYMPTOMS What changed:  See the new instructions.   pravastatin 40 MG tablet Commonly known as:  PRAVACHOL TAKE 1 TABLET DAILY What changed:  when to take this   traMADol 50 MG tablet Commonly known as:  ULTRAM Take 1 tablet (50 mg total) by mouth 3 (three) times daily as needed for moderate pain.        Follow-up Information    Excell Seltzer, MD. Call in 1 week(s).   Specialty:  General Surgery Contact information: [place] [id number] [phone]           Signed: Wellington Hampshire, [facility] 03/25/2018, 10:54 AM Pager: [phone] Mon 7:00 am -11:30 AM Tues-Fri 7:00 am-4:30 pm Sat-Sun 7:00 am-11:30 am

## 2018-03-26 NOTE — Discharge Summary (Signed)
Patient ID: Ana Santos [id number] 77 y.o. [birth year]  03/14/2018  Discharge date and time: 03/19/2018   Admitting Physician: Edward Jolly  Discharge Physician: Edward Jolly  Admission Diagnoses: RIGHT LOWER QUADRANT ABDOMINAL PAIN  Discharge Diagnoses: Inflammatory process of peri-cecal soft tissue, consistent with sclerosing mesenteritis  Operations: Procedure(s): LAPAROSCOPY LAPAROSCOPIC ASSISTED  RIGHT COLECTOMY  Admission Condition: fair  Discharged Condition: fair  Indication for Admission: Pleasant 77 year old female with about 3 months of persistent and slightly progressive right lower quadrant abdominal pain. She has had some lack of appetite and slight weight loss. Her long-standing urinary frequency. Negative colonoscopy. CT shows thickening and hyperenhancement posterior lateral around the cecal wall, terminal ileum and ileocecal valve. No response to antibiotics. Most recent CT also showed apparent bladder tumor confirmed on cystoscopy although biopsy shows no invasion into the bladder wall and this is not felt to be an explanation for her current symptoms. This is of concern for malignancy. Has not responded to antibiotics and colonoscopy did not really show any colitis. Small bowel tumor is a possibility or infiltration from urinary or GYN malignancy, or suppose an unusual presentation of inflammatory bowel disease.  Due to persistent and worsening pain in light of the above work-up and concern for potential malignancy as well after extensive discussion we plan to proceed with diagnostic laparoscopy with probable laparoscopic ileocecectomy.  Hospital Course: On the morning of admission the patient underwent laparoscopy with findings of fairly dense masslike inflammatory change involving the cecum.  With these findings we proceeded with an uneventful laparoscopic ileocecectomy.  She initially was stable postoperatively.  However later in the day of surgery  she began passing blood and clots per rectum and had a brief episode of hypotension.  She was transferred to the ICU for observation.  She had been on Plavix preoperatively that had been held for an appropriate amount of time.  Fortunately the bleeding quickly improved and resolved.  PICC line was placed due to poor IV access.  Over the next 24 hours although she remained clinically stable her hemoglobin did drop from 9.3-7.1 and she was transfused 1 unit of PRBCs.  Then later on October 4 the first postoperative day she became suddenly unresponsive while talking to her family.  She was unable to follow any commands.  Vital signs remained stable.  Code stroke was called.  CT scan was fortunately negative and by the time she returned from the CT scan she was again alert and fully oriented with no recollection of the event.  Neurology was consulted.  MRI of the brain was obtained which was normal.  She had no further events.  Neurology felt that this likely represented a complex partial seizure.  As it occurred during physiologic stress no long-term treatment or further therapy or follow-up was recommended unless she had further episodes.  She is not allowed to drive for the next 6 months. The remainder of her hospital course was unremarkable.  Hemoglobin remained stable at 8.6.  Her diet was advanced and she was able to tolerate a regular diet with normal bowel movements.  Abdomen remained benign and her wound remained clean and she was felt ready for discharge on October 8.  Final pathology did reveal dense inflammatory reaction in the pericecal soft tissue, possibly consistent with sclerosing mesenteritis.  No malignancy or evidence of inflammatory bowel disease. Consults: neurology  Significant Diagnostic Studies: radiology: MRI: Brain and CT scan: Brain  Treatments: Transfusion 1 unit PRBCs  Disposition: Home  Patient  Instructions:  Allergies as of 03/19/2018      Reactions   Atorvastatin Other (See  Comments)    Lipitor caused arm pain (3/09)   Hydrocodone-acetaminophen Other (See Comments)   hallucinations   Morphine Other (See Comments)   hallucinations   Nortriptyline    Caused nausea and vomiting and shaking, kept her up all night   Topamax [topiramate] Nausea And Vomiting   *dizziness*      Medication List    TAKE these medications   acetaminophen 500 MG tablet Commonly known as:  TYLENOL Take 500 mg by mouth every 6 (six) hours as needed for mild pain.   cholecalciferol 1000 units tablet Commonly known as:  VITAMIN D Take 1,000 Units by mouth daily.   clorazepate 7.5 MG tablet Commonly known as:  TRANXENE Take 1 tablet (7.5 mg total) by mouth 3 (three) times daily as needed for anxiety.   hydroxypropyl methylcellulose / hypromellose 2.5 % ophthalmic solution Commonly known as:  ISOPTO TEARS / GONIOVISC Place 1 drop into both eyes 2 (two) times daily.   levothyroxine 75 MCG tablet Commonly known as:  SYNTHROID, LEVOTHROID TAKE 1 TABLET DAILY BEFORE BREAKFAST   metoprolol tartrate 25 MG tablet Commonly known as:  LOPRESSOR TAKE 1 TABLET TWICE A DAY   ondansetron 4 MG tablet Commonly known as:  ZOFRAN Take 1 tablet (4 mg total) by mouth every 8 (eight) hours as needed for nausea or vomiting.   oxyCODONE 5 MG immediate release tablet Commonly known as:  Oxy IR/ROXICODONE Take 1-2 tablets (5-10 mg total) by mouth every 6 (six) hours as needed for severe pain.   pramipexole 0.25 MG tablet Commonly known as:  MIRAPEX TAKE 1 TABLET AT BEDTIME FOR RESTLESS LEG SYMPTOMS What changed:  See the new instructions.   pravastatin 40 MG tablet Commonly known as:  PRAVACHOL TAKE 1 TABLET DAILY What changed:  when to take this   traMADol 50 MG tablet Commonly known as:  ULTRAM Take 1 tablet (50 mg total) by mouth 3 (three) times daily as needed for moderate pain.       Activity: no heavy lifting for 4 weeks Diet: regular diet Wound Care: none  needed  Follow-up:  With Torion Hulgan in 3 weeks.  Signed: Edward Jolly MD, FACS  03/26/2018, 10:49 AM

## 2018-04-01 ENCOUNTER — Telehealth: Payer: Self-pay | Admitting: Pulmonary Disease

## 2018-04-01 MED ORDER — TRAMADOL HCL 50 MG PO TABS: 50.0000 mg | ORAL_TABLET | Freq: Three times a day (TID) | ORAL | 1 refills | Status: DC | PRN

## 2018-04-01 NOTE — Telephone Encounter (Signed)
Pt requesting a refill of Tramadol to be sent to [facility]. Last refill: 09/19/17 #90 with 1 refill. Last OV: 10/23/2017 Next OV: none scheduled, none on recall.  SN please advise on refill request.  Thanks!

## 2018-04-01 NOTE — Telephone Encounter (Signed)
Per SN- ok Tramadol 50mg , take 1 tab, by mouth, TID as needed for moderate pain, #90, with 1 refill.  Prescription called in at [place] per Patient request. Patient notified. Nothing further at this time.

## 2018-04-05 ENCOUNTER — Other Ambulatory Visit: Payer: Self-pay | Admitting: Pulmonary Disease

## 2018-04-26 DIAGNOSIS — Z23 Encounter for immunization: Secondary | ICD-10-CM | POA: Diagnosis not present

## 2018-05-08 DIAGNOSIS — C672 Malignant neoplasm of lateral wall of bladder: Secondary | ICD-10-CM | POA: Diagnosis not present

## 2018-05-22 ENCOUNTER — Other Ambulatory Visit (INDEPENDENT_AMBULATORY_CARE_PROVIDER_SITE_OTHER): Payer: Medicare Other

## 2018-05-22 ENCOUNTER — Ambulatory Visit (INDEPENDENT_AMBULATORY_CARE_PROVIDER_SITE_OTHER): Payer: Medicare Other | Admitting: Pulmonary Disease

## 2018-05-22 ENCOUNTER — Encounter: Payer: Self-pay | Admitting: Pulmonary Disease

## 2018-05-22 ENCOUNTER — Ambulatory Visit (INDEPENDENT_AMBULATORY_CARE_PROVIDER_SITE_OTHER)
Admission: RE | Admit: 2018-05-22 | Discharge: 2018-05-22 | Disposition: A | Discharge status: Home / Self Care | Payer: Medicare Other | Source: Ambulatory Visit | Attending: Pulmonary Disease | Admitting: Pulmonary Disease

## 2018-05-22 VITALS — BP 144/76 | HR 62 | Temp 97.7°F | Ht 63.0 in | Wt 100.4 lb

## 2018-05-22 DIAGNOSIS — I6350 Cerebral infarction due to unspecified occlusion or stenosis of unspecified cerebral artery: Secondary | ICD-10-CM | POA: Diagnosis not present

## 2018-05-22 DIAGNOSIS — J449 Chronic obstructive pulmonary disease, unspecified: Secondary | ICD-10-CM

## 2018-05-22 DIAGNOSIS — K6389 Other specified diseases of intestine: Secondary | ICD-10-CM

## 2018-05-22 DIAGNOSIS — J4489 Other specified chronic obstructive pulmonary disease: Secondary | ICD-10-CM

## 2018-05-22 DIAGNOSIS — K922 Gastrointestinal hemorrhage, unspecified: Secondary | ICD-10-CM | POA: Diagnosis not present

## 2018-05-22 DIAGNOSIS — E538 Deficiency of other specified B group vitamins: Secondary | ICD-10-CM

## 2018-05-22 DIAGNOSIS — E782 Mixed hyperlipidemia: Secondary | ICD-10-CM | POA: Diagnosis not present

## 2018-05-22 DIAGNOSIS — R531 Weakness: Secondary | ICD-10-CM | POA: Diagnosis not present

## 2018-05-22 DIAGNOSIS — I663 Occlusion and stenosis of cerebellar arteries: Secondary | ICD-10-CM

## 2018-05-22 DIAGNOSIS — F411 Generalized anxiety disorder: Secondary | ICD-10-CM | POA: Diagnosis not present

## 2018-05-22 DIAGNOSIS — F17200 Nicotine dependence, unspecified, uncomplicated: Secondary | ICD-10-CM

## 2018-05-22 DIAGNOSIS — D333 Benign neoplasm of cranial nerves: Secondary | ICD-10-CM | POA: Diagnosis not present

## 2018-05-22 DIAGNOSIS — I679 Cerebrovascular disease, unspecified: Secondary | ICD-10-CM | POA: Diagnosis not present

## 2018-05-22 DIAGNOSIS — D508 Other iron deficiency anemias: Secondary | ICD-10-CM | POA: Diagnosis not present

## 2018-05-22 LAB — CBC WITH DIFFERENTIAL/PLATELET
BASOS PCT: 0.8 % (ref 0.0–3.0)
Basophils Absolute: 0.1 10*3/uL (ref 0.0–0.1)
Eosinophils Absolute: 0.2 10*3/uL (ref 0.0–0.7)
Eosinophils Relative: 1.9 % (ref 0.0–5.0)
HCT: 37.1 % (ref 36.0–46.0)
HEMOGLOBIN: 12.1 g/dL (ref 12.0–15.0)
LYMPHS PCT: 30.3 % (ref 12.0–46.0)
Lymphs Abs: 2.9 10*3/uL (ref 0.7–4.0)
MCHC: 32.7 g/dL (ref 30.0–36.0)
MCV: 87.3 fl (ref 78.0–100.0)
MONO ABS: 0.5 10*3/uL (ref 0.1–1.0)
Monocytes Relative: 4.8 % (ref 3.0–12.0)
NEUTROS ABS: 6.1 10*3/uL (ref 1.4–7.7)
NEUTROS PCT: 62.2 % (ref 43.0–77.0)
Platelets: 397 10*3/uL (ref 150.0–400.0)
RBC: 4.25 Mil/uL (ref 3.87–5.11)
RDW: 14 % (ref 11.5–15.5)
WBC: 9.7 10*3/uL (ref 4.0–10.5)

## 2018-05-22 LAB — COMPREHENSIVE METABOLIC PANEL
ALT: 5 U/L (ref 0–35)
AST: 12 U/L (ref 0–37)
Albumin: 4 g/dL (ref 3.5–5.2)
Alkaline Phosphatase: 90 U/L (ref 39–117)
BUN: 12 mg/dL (ref 6–23)
CHLORIDE: 107 meq/L (ref 96–112)
CO2: 26 meq/L (ref 19–32)
CREATININE: 0.69 mg/dL (ref 0.40–1.20)
Calcium: 9.5 mg/dL (ref 8.4–10.5)
GFR: 87.6 mL/min (ref >60.00)
GLUCOSE: 87 mg/dL (ref 70–99)
Potassium: 3.3 mEq/L — ABNORMAL LOW (ref 3.5–5.1)
SODIUM: 142 meq/L (ref 135–145)
Total Bilirubin: 0.3 mg/dL (ref 0.2–1.2)
Total Protein: 7.4 g/dL (ref 6.0–8.3)

## 2018-05-22 LAB — SEDIMENTATION RATE: SED RATE: 71 mm/h — AB (ref 0–30)

## 2018-05-22 LAB — VITAMIN B12: Vitamin B-12: 196 pg/mL — ABNORMAL LOW (ref 211–911)

## 2018-05-22 MED ORDER — MEGESTROL ACETATE 40 MG/ML PO SUSP: ORAL | 5 refills | Status: DC

## 2018-05-22 NOTE — Patient Instructions (Addendum)
Today we updated your med list in our EPIC system...    Continue your current medications the same...  Today we checked a follow up CXR & blood work...    We will contact you w/ the results when available...   We are trying to arrange an ASAP appt w/ an Orthopedist regarding your right shoulder...  For your appetite-- try the new prescription MEGACE liquid:    Take one tsp 3 times daily before meals...  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    Wishing you a [employer] & a happy new year-- you are due for a break, and we are all praying for you!

## 2018-05-23 LAB — IRON,TIBC AND FERRITIN PANEL
%SAT: 15 % — AB (ref 16–45)
Ferritin: 67 ng/mL (ref 16–288)
Iron: 40 ug/dL — ABNORMAL LOW (ref 45–160)
TIBC: 271 mcg/dL (calc) (ref 250–450)

## 2018-05-24 ENCOUNTER — Encounter: Payer: Self-pay | Admitting: Pulmonary Disease

## 2018-05-24 MED ORDER — POTASSIUM CHLORIDE ER 10 MEQ PO TBCR: EXTENDED_RELEASE_TABLET | ORAL | 0 refills | Status: DC

## 2018-05-24 MED ORDER — PREDNISONE 5 MG (21) PO TBPK: ORAL_TABLET | ORAL | 0 refills | Status: DC

## 2018-06-11 ENCOUNTER — Encounter: Payer: Self-pay | Admitting: Pulmonary Disease

## 2018-06-11 DIAGNOSIS — D509 Iron deficiency anemia, unspecified: Secondary | ICD-10-CM | POA: Insufficient documentation

## 2018-06-11 NOTE — Progress Notes (Signed)
Subjective:    Patient ID: Ana Santos, female    DOB: Jan 20, 1941, 78 y.o.   MRN: [id number]  HPI 77 y/o WF here for a follow up visit... she has multiple medical problems as reviewed below...   ~  SEE PREV EPIC NOTES FOR EARLIER DATA >>    LABS 6/13:  FLP- at goals on Prav40;  Chems- wnl;  CBC- wnl;  TSH=0.71;  VitD=53...  CXR 7/14> she forgot to go to the [place] for this film...   LABS 7/14:  FLP- looks ok on Prav40;  Chems- wnl;  CBC- wnl;  TSH=2.47;  VitD=67... ~  ADDENDUM> DrMorris ([facility], Interventional radiology) requests that we obtain her f/u CTAngiogram & send it to him, we will also obtain f/u MRI Brain to assess her acoustic neuroma...  CTA Head 9/14 showed s/p stent assisted coiling of basilar tip aneurysm, no aneurysm recurrence, patent basilar art & left post cerebral art, mild atherosclerotic calcif in cavernous carotids w/o stenosis, post op right mastoid changes w/o recurrent tumor  MRI Brain 9/14 showed no evid of resid or recurrent vestib schwannoma on the right, post surg changes w/ scarring in the int auditory canal, ols sm vessel cerebellar infarctions are stable, mild atrophy & sm vessel dis, prev coiled basilar tip aneurysm, no acute insults... ADDENDUM> Letter from DrMorris 9/14 after review of the CTA indicates that everything is stable and flow is satisfactory (he is leaving [place] can assist in the future if needed).  CXR 1/15 showed norm heart size, COPD/Emphysema w/ scarring in lingula, NAD.Marland KitchenMarland Kitchen  EKG 1/15 showed NSR, rate60, poor R prog V1-3, NSSTTWA, NAD...  2DEcho 2/15 showed normal LV size & function w/ EF=55-60%, no regional wall motion abnormalities, Gr1DD, norm valves and RV...  Holter Monitor 2/15 showed NSR, PACs, PVCs, and no pathologic arrhythmias (the skips correspond to her subjective SOB/palpit)...  ~   08/12/13:  add-on visit post hospital check>  Ana Santos developed diplopia & blurry vision and was adm to [facility] 2/15 - 07/30/13>  review of records indicated that her neuro exam was otherw WNL, MRI Brain showed a right occipital lobe infarct, and MRA showed no new abnormality- no recurrent aneurysm, patent sent & good flow in the PCA; symptom resolved spont & they continued her same meds ([id number] & Plavix75); she has been able to cut down to 1cig/d and again we reviewed the critical need to quit smoking completely...   CT Brain 07/27/13 [facility] showed prior basilar tip aneurysm repair, post surg changes in the right mastoid region, NAD...  MRI Brain 2/15 showed sm areas of acute infarct in right occipital lobe (new from 9/14 MRI); chr infarcts in the cerebellum bilat, basilar art stent & coiling of basilar tip aneurysm...  MRA Brain 2/15 showed no evid for recurrent basilar tip aneurysm, mild to mod narrowing of the right P1 segment, left P1 segment stented...  CDopplers 2/15 showed some plaque in the bulbs and ICAs bilat, <50% stenoses bilat noted...   EKG 2/15 showed NSR, rate63, poor R prog V1-3, NAD...  LABS 2/15 at [facility] showed> Chems- wnl w/ Cr=1.0;  CBC- wnl w/ Hg=13.5;  Abn UA w/ UTI evident...  ~  Myrle went to the ER 08/26/13 with another TIA characterized by diplopia & ataxia that last 52min or so; assoc mild HA, no focal neuro findings in ER; already on ASA325/ Plavix75 w/ good compliance; She denies any additional cerebral ischemic symptoms since the 3/15 ER visit; unfortunately still  smoking; review of chart shows NO documented AFib in her past...  EKG 3/15 showed NSR, rate67, poor R prog V1-3, otherw wnl...  CT Head 3/15 showed evid prior vertebral art aneurysm surg & post-op changes in right mastoid, NAD...  CTA Head & Neck 3/15 showed prev basilar tip aneurysm coiling & basilar stent, prior right mastoidectomy, vessels are patent & no recurrent aneurysm; neck showed apical scarring & emphysema, 40% left subclav stenosis, 40-50% bilat ICA stenoses, mild stenosis at the origin of the vertebral as  well...  LABS 5/15:  FLP- at goals on Prav40;  Chems & LFTs- wnl;  TSH= 0.25 on Levothy75 & rec to decr to 100mcg/d...    ~  April 17, 2014:  71mo ROV & Ana Santos is c/o HA- sharp pains in her temples, assoc w/ occas weak spells ("don't feel like getting OOB"), poor appetite, and occas falling (no known injury); she remains on ASA325 & Plavix75> she voiced discontent w/ Neuro eval at [facility], states she never received call from her doctor (Neuro- DrHusseini);  I reviewed Care Everywhere records- summary & last note 03/11/14 from DrHusseini> extensive note reviewed, pt stable on ASA/ Plavix and no changes made, she was asked to f/u prn;  Ana Santos BP, Chol, BS- all well controlled but she is still smoking and can't vs won't quit;  She requests 2nd opinion from local [facility] due to her temporal HAs, falling, weak spells...  We reviewed the following medical problems during today's office visit >>     COPD> still smoking 1/2ppd & can't vs won't quit; not on meds, min cough/ sputum, denies hemoptysis/ SOB/ ch in DOE, etc; CXR 1/15= COPD, NAD; she MUST quit all smoking!    Palpit> new complaint 1/15> w/u in progress & asked to STOP SMOKING & avoid caffeine etc; Care Everywhere shows 30d Holter w/ PACs/ PVCs/ no AFib...    Cerebrovasc dis/ Aneurysm> on ASA325, Plavix75; she sees DrMorris IR at [facility] 8/12 w/ CTA that looked good- no resid aneurysm, distal flow appeared good & he didn't rec any changes; he requested that we do her f/u CTA & MRI Brain in [place] 9/14=> good flow thru the stented basilar art, no recurrent aneurysm, no recurrent acoustic neuroma;  Then she was [facility] 2/15 w/ TIA & MRI/MRA Brain showed acute infarct in right occipital lobe, otherw stable;  Subsequent f/u by [facility]- no change in meds, rec quit smoking and control of BP, BS, Chol...    Hyperlipid> on Prav40; tol well & FLP 11/15 shows TChol 168, TG 129, HDL 43, LDL 99; continue same + diet...    Hypothyroid> on Levothy50  now; labs 11/15 shows TSH= 9.91 and she is rec to increase Synthroid back to 56mcg/d...    DJD/ FM> she is managing quite well w/ prn Tramadol & Tylenol; prev CWP resolved...    Osteopenia> she had min osteopenia on BMD several yrs ago & takes Calcium, MVI, Vit D 1000u daily; f/u BMD 2/15 showed TScores -0.6 in Spine, and -1.6 in right FemNeck; rec to stay on supplements & wt bearing exercise...    RLS> on Mirapex 0.25mg  Qhs as needed...     Acoustic Neuroma> this was resected by DrBrowne [facility] 2002; MRI 2/11 continued to look good- post op changes, no recurrent tumor seen; f/u MRI here 9/14 (& [facility] 2/15) continues to look good- no evid for recurrent or resid tumor...    Anxiety> on Tranxene as needed... We reviewed prob list, meds,  xrays and labs> see below for updates >> OK Flu shot today...  LABS 11/15:  FLP- at goals on Prav40;  Chems- wnl;  CBC- wnl;  TSH=9.91;  Sed=36... PLAN>>  She desperately need to quit smoking, declines smoking cessation help;  TSH is elev on Synthroid50 & she confirms daily dosing, therefore incr back to Synthroid75;  We will refer to [facility] for 2nd opinion regarding her cerebrovasc dis, HAs, falling, weak spells...   ~  August 03, 2014:  65mo ROV & acute add-on visit requested by Neuro-DrJaffe due to CP & irreg heartbeat> Ana Santos was at [facility] office today (for f/u of headaches- improved w/ Neurontin but intol she said due to incr palpit) & mentioned left CP & palpit, he noted skips on exam & called for a work-in appt here;  She has a cardiac hx- followed for yrs by DrStuckey w/ cath 1995 showing min nonobstructive CAD & mild MVP; she developed palpit in 2015 w/ abn EKG (poor R prog, NSTTWA)) and 2DEcho showing norm LVF, no regional wall motion abn, Gr1DD, normal valves & RV; Holter monitor revealed NSR w/ some PACs & PVCs but no dangerous arrhythmias- she was asked to avoid caffeine, pseudophed, and QUIT SMOKING (she still smokes ~1/2ppd)... As noted she  has severe cerebrovasc dis & hx Acoustic Neuroma w/ prev gamma knife surg at [facility] in 2002; a 30d Event monitor at [facility] was said to reveal PACs, PVCs, but no AFib... Current symptoms sound similar to prev w/ skipping sensation, no racing, sl SOB, mild chest discomfort on & off daily x 3-4wks (sharp, not sore to touch, sl worse w/ movement); she does not exercise & her most strenuous activity is housework...    We reviewed prob list, meds, xrays and labs> see below for updates >>   CXR 2/16 showed norm heart size, linear atx vs scarring left base, otherw neg- NAD.Marland KitchenMarland Kitchen  EKG today showed NSR, rate80, occ PVCs noted, poor R prog V1=>3, NSSTTWA... PLAN>>  We decided to start METOPROLOL 25mg - 1/2 tab Bid for now & refer to Cards for f/u eval; reminded about smoking cessation but she declines additional counseling; her husb sees DrMcLean...  ~  Oct 20, 2014:  48mo ROV & Ana Santos reports improved after cardiac eval by DrArida (see below)- neg cath, neg 2DEcho, palpit improved w/ Metoprolol & decr caffeine etc; unfortunately she continues to smoke & will not quit, refuses smoking cessation meds etc... She is in the middle of bilat cat surg by DrBevis... No new complaints or concerns- denies cough, sput, hemoptysis, SOB, CP, f/c/s, etc; she states her main exercise is yard work... We reviewed prob list, meds, xrays and labs> see below for updates >>  PLAN>> she reports improved on current med regimen; we again discussed the need for smoking cessation, take meds regularly; she will f/u in 58mo w/ Fasting labs & call in the interim prn...  ~  April 22, 2015:  68mo ROV & Ana Santos has been having some increased HAs- went to see DrJaffe in Neuro w/ f/u MRI/MRA (no acute changes) and treated transiently w/ Pred taper & improved; she has Tramadol/ Tylenol to use as needed;  We reviewed the following medical problems during today's office visit >>     COPD> still smoking 5-6cig/d she says & can't vs won't quit; not on meds,  denies cough/ sputum/ hemoptysis/ SOB/ ch in DOE, etc; CXR 2/16= COPD, mild basilar atx, NAD; Spirometry shows mild obstructive dis- must quit all smoking!  Palpit> this was a new complaint 1/15> w/u DrArida was neg (2DEcho, Cath) & symptoms improved w/ Metoprolol- asked to STOP SMOKING & avoid caffeine etc; Care Everywhere shows 30d Holter w/ PACs/ PVCs/ no AFib...    Cerebrovasc dis/ Aneurysm> on ASA325, Plavix75; she sees DrMorris IR at [facility] 8/12 w/ CTA that looked good- no resid aneurysm, distal flow appeared good & he didn't rec any changes; he requested that we do her f/u CTA & MRI Brain in [place] 9/14=> good flow thru the stented basilar art, no recurrent aneurysm, no recurrent acoustic neuroma;  Then she was [facility] 2/15 w/ TIA & MRI/MRA Brain showed acute infarct in right occipital lobe, otherw stable;  Subsequent f/u by [facility] Neurology- no change in meds, rec quit smoking and control of BP, BS, Chol...    Hyperlipid> on Prav40; tol well & FLP 11/16 shows TChol 140, TG 112, HDL 43, LDL 75; continue same + diet...    Hypothyroid> on Levothy75 now; labs 11/15 shows TSH= 9.91 and she was rec to increase Synthroid back to 55mcg/d; Labs 11/16 showed TSH=0.22, continue same for now...    DJD/ FM> she is managing quite well w/ prn Tramadol & Tylenol; prev CWP resolved...    Osteopenia> she had min osteopenia on BMD several yrs ago & takes Calcium, MVI, Vit D 1000u daily; f/u BMD 2/15 showed TScores -0.6 in Spine, and -1.6 in right FemNeck; rec to stay on supplements & wt bearing exercise...    RLS> on Mirapex 0.25mg  Qhs as needed...     Acoustic Neuroma> resected by DrBrowne [facility] 2002; MRI 2/11 continued to look good- post op changes, no recurrent tumor seen; f/u MRI here 9/14 (& [facility] 2/15) continues to look good- no evid for recurrent or resid tumor.    Headaches> s/p eval by Neuro, DrJaffe- 9/16 note reviewed, MRI w/o acute changes, Cspine spondy noted w/ some spinal stenosis,  treated w/ Pred taper & improved...    Anxiety> on Tranxene as needed... We reviewed prob list, meds, xrays and labs> OK 2016 FLU vaccine today...  MRI/MRA 02/22/15>  No acute infarct, tiny cbll blood breakdown products unchanged w/o new areas of intracranial hemorrhage detected, remote right cbll infarcts & sm vessel dis, postop right vestib schwannoma surg- no acute changes, cerv spondylosis w/ sp stenosis C3-4 & C4-5, s/p coiling basilar tip aneurysm, mild irregularity of basilar art, mod narrowing of P1 segm left p[ost cerebral art (mild narrowing on right)...   Spirometry 04/22/15>  FVC=2.46 (97%), FEV1=1.69 (88%), %1sec=69%, mid-flows reduced at 64% predicted;  This is c/w mild airflow obstruction and GOLD Stage 1 COPD  LABS 04/22/15>  FLP- at goals on Prav40;  Chems- wnl;  CBC- wnl;  TSH= 0.22 on Synth75;  VitD= 48 on 1000u/d... IMP/PLAN>>  Ana Santos is stable overall w/ her mult serious medical issues;  We again discussed smoking cessation;  She continues to do alterations as she has done for 50+yrs;  We refilled her Tramadol & Tranxene...  ~  Oct 20, 2015:  35mo ROV & Ana Santos has been stable over the interval, c/o incr pain/ discomfort in neck & back (sched for ?neck injections next week, DrJaffe, DrBotero);  She is still smoking but now down to 2cig/d she says, breathing is OK & she denies cough/ sput/ hemoptysis/ SOB/ or CP;  She has been walking some & doing yard work- says she is NOT limited by her breathing & she has declined breathing meds in the past... We  reviewed the following medical problems during today's office visit >>     COPD> still smoking 1-2cig/d she says & can't vs won't quit; not on meds, denies cough/ sputum/ hemoptysis/ SOB/ ch in DOE, etc; CXR 2/16= COPD, mild basilar atx, NAD; Spirometry shows mild obstructive dis- must quit all smoking!     Palpit> this was a new complaint 1/15> w/u DrArida was neg (2DEcho, Cath) & symptoms improved w/ Metoprolol- asked to STOP SMOKING &  avoid caffeine etc; Care Everywhere shows 30d Holter w/ PACs/ PVCs/ no AFib...    Cerebrovasc dis/ Aneurysm> on ASA325, Plavix75; she sees DrMorris IR at [facility] 8/12 w/ CTA that looked good- no resid aneurysm, distal flow appeared good & he didn't rec any changes; he requested that we do her f/u CTA & MRI Brain in [place] 9/14=> good flow thru the stented basilar art, no recurrent aneurysm, no recurrent acoustic neuroma;  Then she was [facility] 2/15 w/ TIA & MRI/MRA Brain showed acute infarct in right occipital lobe, otherw stable;  Subsequent f/u by [facility] Neurology- no change in meds, rec quit smoking and control of BP, BS, Chol...    Hyperlipid> on Prav40; tol well & FLP 11/16 shows TChol 140, TG 112, HDL 43, LDL 75; continue same + diet...    Hypothyroid> on Levothy75 now; labs 11/15 shows TSH= 9.91 and she was rec to increase Synthroid back to 60mcg/d; Labs 11/16 showed TSH=0.22, continue same for now...    DJD/ FM> she is managing quite well w/ prn Tramadol & Tylenol; prev CWP resolved...    Osteopenia> she had min osteopenia on BMD several yrs ago & takes Calcium, MVI, Vit D 1000u daily; f/u BMD 2/15 showed TScores -0.6 in Spine, and -1.6 in right FemNeck; rec to stay on supplements & wt bearing exercise...    RLS> on Mirapex 0.25mg  Qhs as needed...     Acoustic Neuroma> resected by DrBrowne [facility] 2002; MRI 2/11 continued to look good- post op changes, no recurrent tumor seen; f/u MRI here 9/14 (& [facility] 2/15) continues to look good- no evid for recurrent or resid tumor.    Headaches> s/p eval by Neuro, DrJaffe- 9/16 note reviewed, MRI w/o acute changes, Cspine spondy noted w/ some spinal stenosis, treated w/ Pred taper & improved...    Anxiety> on Tranxene as needed... EXAM shows Afeb, VSS, O2sat=100% on RA;  HEENT- neg, mallampati1;  Chest- clear w/o w/r/r;  Heart- RR w/o m/r/g;  Abd- soft, nontender, neg;  Ext- VI w/o c/c/e;  Neuro- no focal neuro deficits...  LAB 10/20/15:  TSH=0.48 on  Synthroid 48mcg/d- continue same... IMP/PLAN>>  Mult medical issues as noted; Ana Santos is still smoking although she has cut down to 1-2cig/d she is reminded of the need to quit completely; REC to continue same meds regularly; she is sched for shots in her back next week... We plan ROV w/ CXR & fasting labs in 63mo.  ~  April 24, 2016:  19mo ROV & pulm/ medical follow up visit>  Ana Santos is still smoking 2-4 cig/d, denies cough, sput, SOB, but not doing any exercise (some yard work/ walking & no difficulties reported;  He only issue is occas bronchitic infection- usually Rx w/ Levaquin & resolves;  She denies CP, get rare palpit, no edema, etc;  She has known cerebrovasc dis/ aneurysm & followed by [facility] for this, her hx acoustic neuroma, and chr HAs- Tramadol helps but nothing else (he tried 5 diff meds w/o benefit).Marland KitchenMarland Kitchen  As noted- she is still smoking but denies breathing problems, no on meds but strongly urged to quit all smoking & offered smoking cessation counseling but declines...   She remains on Prav40 + diet for Chol and FLP today shows TChol 169, TG 158, HDL 46, LDL 91...    Hx hypothyroidism on Synthroid38mcg/d but TSH=0.20 & we decr dose today to 3mcg/d...    She saw Neuro, DrJaffe last 03/31/16> extensive note reviewed- hx basilar tip aneurysm- s/p coiling 2010, right vestib schwannoma- s/presection 2002, right cerebelar infact, recurrent TIAs, cervical spinal stenosis, tension-type HAs; she has seen DrBotero & had nerve root injections for LBP... EXAM shows Afeb, VSS, O2sat=99% on RA;  HEENT- neg, mallampati1;  Chest- clear w/o w/r/r;  Heart- RR w/o m/r/g;  Abd- soft, nontender, neg;  Ext- VI w/o c/c/e;  Neuro- no focal neuro deficits...  Last CXR was 12/17/15>  Norm heart size, Ao atherosclerosis, hyperinflated w/ flat diaph c/w COPD, scarring left base, NAD...   LABS 04/24/16>  FLP- Chol ok x TG=158;  Chems- ok w/ BS=81, Cr=0.83;  CBC- wnl;  TSH=0.20 on Synthroid75;  VitD=56...   IMP/PLAN>>  Ana Santos has mult medica issues and continues to smoke- refusing all smoking cessation help;  Labs ok today x low TSH & we will dec her Synthroid from78mcg/d to 28mcg/d w/ f/u lab on return;  She is due PREVNAR-13 vaccine;  Her CC is still her chr daily HAs and we discussed trial Tranxene 7.5mg Bid to see if this helps (w/ freq & severity of the HAs) and Tramadol 50mg  prn- monitor use...  ~  Oct 23, 2016:  51mo ROV & Ana Santos reports that she is still smoking 4 cig/d despite all her cerebrovasc dis- again advised to quit all smoking in light of her prev cerebral infarction and severe vasc disease;  She has similarly declined inhaled meds & is proud that she made it thru the winter & spring so far w/o a bronchitic exac or upper resp infection...  We reviewed the following medical problems during today's office visit >>     COPD> still smoking 4cig/d she says & can't vs won't quit; not on meds, denies cough/ sputum/ hemoptysis/ SOB/ ch in DOE, etc; CXR 7/17= COPD, mild basilar atx, NAD; Spirometry 11/16 shows mild obstructive dis (FEV1=1.69, 88%)- must quit all smoking!     Palpit> this was a new complaint 1/15> w/u DrArida was neg (2DEcho, Cath) & symptoms improved w/ Metoprolol25Bid- asked to STOP SMOKING & avoid caffeine etc; Care Everywhere shows 30d Holter w/ PACs/ PVCs/ no AFib.    Cerebrovasc dis/ Aneurysm> on ASA325, Plavix75; she sees DrMorris IR at [facility] 8/12 w/ CTA that looked good- no resid aneurysm, distal flow appeared good & he didn't rec any changes; he requested that we do her f/u CTA & MRI Brain in [place] 9/14=> good flow thru the stented basilar art, no recurrent aneurysm, no recurrent acoustic neuroma;  Then she was [facility] 2/15 w/ TIA & MRI/MRA Brain showed acute infarct in right occipital lobe, otherw stable;  Subsequent f/u by [facility] Neurology- no change in meds, rec quit smoking and control of BP, BS, Chol...    Hyperlipid> on Prav40; tol well & FLP 11/17 shows TChol  169, TG 158, HDL 46, LDL 91; continue same + better diet...    Hypothyroid> on Levothy50 now;  Labs 11/16 on Levothy75 showed TSH=0.22, Labs 11/17 showed TSH=0.20 so we decr her to 24mcg/d;  Labs 10/23/16 shows TSH=6.28 & reminded  to take it Qam by itself & don't eat for 12min.    DJD/ FM> she is managing quite well w/ prn Tramadol & Tylenol; prev CWP resolved, now c/o intermittent left arm discomfort...    Osteopenia> she had min osteopenia on BMD several yrs ago & takes Calcium, MVI, Vit D 1000u daily; f/u BMD 2/15 showed TScores -0.6 in Spine, and -1.6 in right FemNeck; rec to stay on supplements & wt bearing exercise...    RLS> on Mirapex 0.25mg  Qhs as needed which continues to help she says...     Acoustic Neuroma> resected by DrBrowne [facility] 2002; MRI 2/11 continued to look good- post op changes, no recurrent tumor seen; f/u MRI here 9/14 (& [facility] 2/15) continues to look good- no evid for recurrent or resid tumor.    Headaches> s/p eval by Neuro, DrJaffe- 9/16 note reviewed, MRI w/o acute changes, Cspine spondy noted w/ some spinal stenosis, treated w/ Pred taper & improved; she tells me that she has stopped seeing Neuro because she was INTOL to all his meds uses to rx her HAs, she notes only the Tramadol+Tylenol regimen seems to help...    Anxiety> on Tranxene as needed... EXAM shows Afeb, VSS, O2sat=98% on RA;  HEENT- neg, mallampati1;  Chest- clear w/o w/r/r;  Heart- RR w/o m/r/g;  Abd- soft, nontender, neg;  Ext- VI w/o c/c/e;  Neuro- no focal neuro deficits...  LABS 10/23/16>  TSH= 6.28 & she is reminded to take it ist thing in the AM on empty stomach w/o other meds, & don't eat/ drink for 45 min... IMP/PLAN>>  Keimya is stable given her severe underlying dis & continued smoking habit;  She is asked to quit all smoking, take meds regularly as discussed & we gave her the PREVNAR-13 vaccination today;  We plan ROV recheck in 34mo w/ CXR, EKG, FASTING blood work...  ~  April 25, 2017:  73mo  ROV & Ana Santos reports a good interval- feeling well w/o new complaints or concerns; she notes that intermittent HAs persist but the Tramadol + Tylenol works fine & she reiterates that she was INTOL to ALL preventive meds tried by [facility] (Neuro) previously;  She is still smking ~4cig/d and can't vs won't quit;  She denies cough, sputum, hemoptysis, and states no change in her DOE/ SOB; she exercises w/ yard work pushing wheel barrow all day she says; also denies CP, palpit, dizzy, edema...  We reviewed the following medical problems during today's office visit>      COPD> still smoking 4cig/d she says & can't vs won't quit; not on meds, denies cough/ sputum/ hemoptysis/ SOB/ ch in DOE, etc; CXR today= COPD, sl incr markings, NAD; Spirometry 11/16 shows mild obstructive dis (FEV1=1.69, 88%)- must quit all smoking!     Palpit> this was a new complaint 1/15> w/u DrArida was neg (2DEcho, Cath) & symptoms improved w/ Metoprolol25Bid- asked to STOP SMOKING & avoid caffeine etc; Care Everywhere shows 30d Holter w/ PACs/ PVCs/ no AFib.    Cerebrovasc dis/ Aneurysm> on ASA325, Plavix75; she sees DrMorris IR at [facility] 8/12 w/ CTA that looked good- no resid aneurysm, distal flow appeared good & he didn't rec any changes; he requested that we do her f/u CTA & MRI Brain in [place] 9/14=> good flow thru the stented basilar art, no recurrent aneurysm, no recurrent acoustic neuroma;  Then she was [facility] at [facility] 2/15 w/ TIA & MRI/MRA Brain showed acute infarct in right occipital lobe, otherw stable;  Subsequent f/u by [facility]rology- no change in meds, rec quit smoking and control of BP, BS, Chol...    Hyperlipid> on Prav40; tol well & FLP 11/18 shows TChol 139, TG 107, HDL 49, LDL 69; continue same + diet...    Hypothyroid> on Levothy50;  Labs 10/23/16 shows TSH=6.28 & reminded to take it Qam by itself & don't eat for 35min; Labs today shows TSH=10.82 & we rec incr Levothy back to 64mcg/d...    DJD/ FM> she is managing quite  well w/ prn Tramadol & Tylenol; prev CWP resolved, now c/o intermittent left arm discomfort...    Osteopenia> she had min osteopenia on BMD several yrs ago & takes Calcium, MVI, Vit D 1000u daily; f/u BMD 2/15 showed TScores -0.6 in Spine, and -1.6 in right FemNeck; rec to stay on supplements & wt bearing exercise...    RLS> on Mirapex 0.25mg  Qhs as needed which continues to help she says...     Acoustic Neuroma> resected by DrBrowne [facility] 2002; MRI 2/11 continued to look good- post op changes, no recurrent tumor seen; f/u MRI here 9/14 (& [facility] 2/15) continues to look good- no evid for recurrent or resid tumor.    Headaches> s/p eval by Neuro, DrJaffe- 9/16 note reviewed, MRI w/o acute changes, Cspine spondy noted w/ some spinal stenosis, treated w/ Pred taper & improved; she tells me that she has stopped seeing Neuro because she was INTOL to all his meds uses to rx her HAs, she notes only the Tramadol+Tylenol regimen seems to help...    Anxiety> on Tranxene as needed... EXAM shows Afeb, VSS, O2sat=97% on RA;  HEENT- neg, mallampati1;  Chest- clear w/o w/r/r;  Heart- RR w/o m/r/g;  Abd- soft, nontender, neg;  Ext- VI w/o c/c/e;  Neuro- no focal neuro deficits...  CXR 04/25/17 (independently reviewed by me in the PACS system) showed norm heart size, aortic atherosclerosis, coarse interstitial markings bilat- NAD, mild multilevel DDD in Tspine...   LABS 04/25/17>  FLP- at goals on Prev40;  Chems- wnl w/ Cr0.82;  CBC- wnl w/ Hg=12.4;  TSH on Levothy50 =10.82 & we rec incr back to 76mcg/d IMP/PLAN>>  Ana Santos remains stable but continues to smoke ~4cig/d & she is again implored to quit!  Give 2018 FLU vaccine today;  Reminded to stay as active as poss w/ home exercises during the winter; we plan ROV recheck in 46mo, sooner if needed prn...   ~  Oct 23, 2017:  34mo ROV & Ana Santos presents w/ a 3wk hx of RLQ abd pain>  67 WF presents c/o "I'm falling apart" noting RLQ pain/ discomfort x 3wks, also c/o  left groin pain w/ walking & weight bearing and left occipital area pain on & off assoc w/ shooting left sided neck pain; she continues to smoke 4-5 cig/d, denies cough/ sputum/ SOB/ wheezing/ etc...  She has known severe cerebrovasc dis & hx acoustic neuroma- s/p surg at [facility];  Chart indicates min GI history- Hx TAH, ?if she still has appendix, hx mild IBS-c yrs ago & saw drPatterson w/ colonoscopy 11/06 that was neg/ wnl...  Along w/ the RLQ pain she notes appetite is fair but when she prepares the food she doesn't want to eat it; Wt today=112#, 5\' 2" Tall. BMI=20.5 (she has lost 8# over the past 65mo); notes sl nausea, no vomiting, normal BMs w/o D/C/ or blood seen; similarly she denies f/c/s => we discussed need for LABS and CT Abd&Pelvis ASAP...    NOTE:  She  has not had any interval visits w/ any of her medical specialists since her last ov 04/25/17... We reviewed the following medical problems during today's office visit>      COPD> still smoking 4cig/d she says & can't vs won't quit; not on meds, denies cough/ sputum/ hemoptysis/ SOB/ ch in [place], etc; CXR 11/18 showed COPD, sl incr markings, NAD; Spirometry 11/16 showed mild obstructive dis (FEV1=1.69, 88%)- must quit all smoking!     Palpit> this was a new complaint 1/15> w/u DrArida was neg (2DEcho, Cath) & symptoms improved w/ Metoprolol25Bid- asked to STOP SMOKING & avoid caffeine etc; Care Everywhere shows 30d Holter w/ PACs/ PVCs/ no AFib.    Cerebrovasc dis/ s/p basilar tip Aneurysm coiling> on ASA325, Plavix75; she sees DrMorris IR at [facility] 8/12 w/ CTA that looked good- no resid aneurysm, distal flow appeared good & he didn't rec any changes; he requested that we do her f/u CTA & MRI Brain in [place] 9/14=> good flow thru the stented basilar art, no recurrent aneurysm, no recurrent acoustic neuroma;  Then she was [facility] 2/15 w/ TIA & MRI/MRA Brain showed acute infarct in right occipital lobe, otherw stable;  Subsequent f/u by [facility]  Neurology- no change in meds, rec quit smoking and control of BP, BS, Chol...    Hyperlipid> on Prav40; tol well & FLP 11/18 shows TChol 139, TG 107, HDL 49, LDL 69; continue same + diet...    Hypothyroid> on Levothy75 now;  Labs 10/23/16 shows TSH=6.28 & reminded to take it Qam by itself & don't eat for 13min; Labs 11/18 showed TSH=10.82 & we incr Levothy back to 59mcg/d => f/u TSH today = 0.44, cont same.    NEW COMPLAINT 10/23/17- RLQ pain x 3wks => eval in progress    DJD/ FM> she is managing quite well w/ prn Tramadol & Tylenol; prev CWP resolved, prev left arm discomfort resolved on its own, now c/o intermittent left goin pain/ discomfort    Osteopenia> she had min osteopenia on BMD several yrs ago & takes Calcium, MVI, Vit D 1000u daily; f/u BMD 2/15 showed TScores -0.6 in Spine, and -1.6 in right FemNeck; rec to stay on supplements & wt bearing exercise...    RLS> on Mirapex 0.25mg  Qhs as needed which continues to help she says...     Acoustic Neuroma> resected by DrBrowne [facility] 2002; MRI 2/11 continued to look good- post op changes, no recurrent tumor seen; f/u MRI here 9/14 (& [facility] 2/15) continues to look good- no evid for recurrent or resid tumor.    Headaches> s/p eval by Neuro, DrJaffe- 9/16 note reviewed, MRI w/o acute changes, Cspine spondy noted w/ some spinal stenosis, treated w/ Pred taper & improved; she tells me that she has stopped seeing Neuro because she was INTOL to all his meds used to rx her HAs, she notes only the Tramadol+Tylenol regimen seems to help...    Anxiety> on Tranxene as needed... EXAM shows Afeb, VSS, O2sat=100% on RA;  HEENT- neg, mallampati1;  Chest- clear w/o w/r/r;  Heart- RR w/o m/r/g;  Abd- +BS, tender RLQ on palp w/ some guarding, no rebound, no mass felt;  Ext- VI w/o c/c/e;  Neuro- no focal neuro deficits...  CT Abd&Pelvis 10/23/17>  Centrilob emphysema seen in lung bases- NAD; normal GB, ducts, liver, pancreas, spleen; 1.6cm left adrenal nodule  (presumed adenoma); kidneys ok x subcentimeter hypodense renal cort lesions bilat; MILD WALL THICKENING IN TERMINAL ILEUM, APPENDIX NOT DISCRETELY VISUALIZED-  6MM CALCIF ADJACENT TO LAT MARGIN OF CECUM (?RETAINED APPENDICOLITH?), ECCENTRIC WALL THICKENING IN CECUM 7 ASC COLON W/ ASSOC PATCHY FAT STRANDING & ILL-DEFINED FLUID IN RLQ; atherosclerotic Abd Ao w/o aneurysm, no pathologically enlarged LNs; s/p Hyst, no adnexal masses.  Radiologist favors an infectious or inflamm enterocolitis  LABS 10/23/17>   Chems- wnl w/ K=3.9, Cr=0.82, BS=81, LFTs wnl;  CBC- Hg=13.5, WBC=12.2;  SED=68;  TSH=0.44 IMP/PLAN>>  RLQ pain w/ abn CT Abd as above- DDx betw ischemic colitis vs infectious or inflamm enterocolitis; I discussed situation w/ GI-DrArmbruster via phone today => rec treatment w/ CIPRO/ FLAGYL, liq diet & they will get her into the GI office ASAP to check pt & arrange for colonoscopy;  Pt understands that if pain increases or she develops vomiting, diarrhea, blood, etc that she is to go immediately to the ER for ADM & in-patient eval...   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  We reviewed the following interval medical/Epic notes avail in the EMR>      She saw GI- PGuenther,NP on 10/31/17 to July2019> recent eval for lower abd pain w/ CT scan suggesting wall thickening in the terminal ileum, cecum & asc colon w/ fat stranding; treated w/ antibiotics w/ persistent RLQ symptoms & severe nausea;  Follow up CTAbd showed persistent stranding leading DrPerry to perform Colonoscopy on 12/10/17- sessile 43mm polyp in cecum (tub adenoma), 4 polyps in remaining colon (asc/ desc/  sigmoid- all adenomas), few divertics & unsuccessful cecal intubation; this lead to a CT Enterography to eval the distal small bowel=> cecal wall thickening w/ mucosal hyperenhancement possibly due to infection or inflamm colitis, incidental findings included an irregularity in right bladder wall suggesting urothelial cancer, aortic atherosclerosis, emphysma, leaft adrenal adenoma (no change).    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     She was re-hospitalized 10/11 - 03/24/18 by [facility] for a lower GI bleed felt to be an anastomotic bleed related to ASA/Plavix therapy (she had mistakenly restarted her ASA/Plavix at disch); this was held & the bleeding stopped Hg was stable & DC home... We have not received any f/u notes from [facility] since this DC summary. We reviewed the following medical problems during today's office visit>      COPD> still smoking 4cig/d she says & can't vs won't quit; not on meds, denies cough/ sputum/ hemoptysis/ SOB/ ch in [place], etc; CXR 11/18  showed COPD, sl incr markings, NAD; Spirometry 11/16 showed mild obstructive dis (FEV1=1.69, 88%)- must quit all smoking!  We did f/u CXR 05/22/18> COPD, thoracic  aortic atherosclerosis, NAD...    Palpit> this was a new complaint 06/2013> w/u DrArida was neg (2DEcho, Cath) & symptoms improved w/ Metoprolol25Bid- asked to STOP SMOKING & avoid caffeine etc; Care Everywhere shows 30d Holter w/ PACs/ PVCs/ no AFib.    Cerebrovasc dis/ s/p basilar tip Aneurysm coiling> prev on [id number], Plavix75; she sees DrMorris IR at [facility] 8/12 w/ CTA that looked good- no resid aneurysm, distal flow appeared good & he didn't rec any changes; he requested that we do her f/u CTA & MRI Brain in [place] 9/14=> good flow thru the stented basilar art, no recurrent aneurysm, no recurrent acoustic neuroma;  Then she was [facility] 2/15 w/ TIA & MRI/MRA Brain showed acute infarct in right occipital lobe, otherw stable;  Subsequent f/u by [facility] Neurology- no change in meds (continue ASA/Plavix), rec quit smoking and control of BP, BS, Chol...    Hyperlipid> on Prav40; tol well & last FLP 11/18 shows TChol 139, TG 107, HDL 49, LDL 69; continue same + diet...    Hypothyroid> on Levothy75;  Labs 10/23/16 shows TSH=6.28 & reminded to take it Qam by itself & don't eat for 90min; Labs 11/18 showed TSH=10.82 & we incr Levothy back to 54mcg/d => f/u TSH 5/19= 0.44, cont same.    RLQ pain=> eval 2019 w/ laparoscopic surg by DrHoxworth=> Inflamm mass at cecum ?sclerosing mesenteritis; symptoms improved post op...    Bladder cancer> incidental finding on CT enterography (part of GI eval for RLQ pain); subseq cysto & bx by urology- DrEskridge 01/2018 w/ post procedure Gemcitabine instillation; she continues f/u w/ Urology...    DJD/ FM> she is managing quite well w/ prn Tramadol & Tylenol; prev CWP resolved, prev left arm discomfort resolved on its own, now c/o intermittent left goin pain/ discomfort    Osteopenia> she had min osteopenia on BMD several yrs ago & takes Calcium, MVI, Vit D 1000u daily; f/u BMD 2/15 showed TScores -0.6 in Spine, and -1.6 in right FemNeck; rec to stay on supplements & wt bearing  exercise...    RLS> on Mirapex 0.25mg  Qhs as needed which continues to help she says...     Acoustic Neuroma> resected by DrBrowne [facility] 2002; MRI 2/11 continued to look good- post op changes, no recurrent tumor seen; f/u MRI here 9/14 (& [facility] 2/15) continues to look good- no evid for recurrent or resid tumor.    Headaches> s/p eval by Neuro, DrJaffe- 9/16 note reviewed, MRI w/o acute changes, Cspine spondy noted w/ some spinal stenosis, treated w/ Pred taper & improved; she tells me that she has stopped seeing Neuro because she was INTOL to all his meds used to rx her HAs, she notes only the Tramadol+Tylenol regimen seems to help...    Anxiety> on Tranxene as needed...    NEW> K=3.3, Fe=40, B12=196, Sed=71 => REC rx w/ K10 capsules- 2poQd;  FeSO4-325mg Qd + VitC500;  B12 1072mcg tab poQd;  Trial Pred Dosepak for elev sed & her systemic symptoms... EXAM shows Afeb, VSS, O2sat=99% on RA;  HEENT- neg, mallampati1;  Chest- clear w/o w/r/r;  Heart- RR w/o m/r/g;  Abd- +BS, nontender, no guarding or rebound, no mass felt;  Ext- VI w/o c/c/e;  Neuro- no focal neuro deficits...  CXR 05/22/18 (independently reviewed by me in the PACS system) showed norm heart  size, calcif in wall of aortic arch, COPD, clear- NAD...  LABS 05/22/18>  Chems- K=3.3, BS=87, Cr=0.69, LFTs wnl & Alb=4.0;  CBC- ok w/ Hg=12, mcv=87, WBC=9.7;  Sed=71;  Fe=40 (15%sat), Ferritin=67;  B12=196 IMP/PLAN>>  As documented above- we need to consider restart of ASA/Plavix due to her severe vasc dis;  Based on current labs- start K10-2/d, FeSO4+VitC, B12 orally, Pred dosepak; she will need short term follow up in 8month.  We discussed my upcoming retirement Jan2020 & she will seek new PCP at [facility] clinic...           Problem List:   COPD (ICD-496) - she has a min smoker's cough, sm amt beige sputum... ~  CXR 5/08 w/ chr changes, LLL scar, NAD. ~  CXR 7/09 showed norm hrt size, clear lungs, NAD. ~  CXR 9/10 showed NAD. ~  CXR  10/11 showed chr changes, NAD. ~  CXR 12/12 showed COPD, NAD. ~  CXR 4/13 showed normal heart size, incr interstitial markings, NAD. ~  CXR 1/15 showed norm heart size, COPD/Emphysema w/ scarring in lingula, NAD. ~  CXR 2/16 showed norm heart size, linear atx vs scarring left base, otherw neg- NAD ~  Spirometry 04/22/15>  FVC=2.46 (97%), FEV1=1.69 (88%), %1sec=69%, mid-flows reduced at 64% predicted;  This is c/w mild airflow obstruction and GOLD Stage 1 COPD  CIGARETTE SMOKER (ICD-305.1) - can't vs won't quit and not interested in smoking cessation help... now she states she's decr to ~1/4-1/2 ppd but can't seem to improve from there... ~  She understands the critical need to quit smoking from the COPD/Pulm, Cardiac, & Vascular/Stroke standpoints;  She declines offers for smoking cessation help, chantix, nicotine replacement, etc... ~  2/15:  She was hosp at [facility] w/ Diplopia & found to have a right occipital stroke=> she has decr smoking to <1cig/d & encouraged to quit completely!!! ~  5/15 to 11/15:  She is still smoking 3-4cig/d she says & I have begged her to quit, discussed nicotine replacement rx & alternatives... ~  2/16: she is still smoking <1/2ppd she says but again declines smoking cessation help... ~  5/17: she has decr to 1-2 cig/d she says, reminded of the need to quit completely!  ?MITRAL VALVE PROLAPSE (ICD-424.0) >> mild MVP seen on cath 1995 by DrStuckey... Hx of CHEST PAIN (ICD-786.50) >> she had CP in the past and a neg cath 1995 (min coronary irregularities)...  Hx PALPITATIONS >> PVCs and PACs seen on EKG & Holter monitor in 2015; asked to stop all caffeine & nicotine... ~  CP eval 1995 w/ cath showing min 10% luminal irreg RCA & mild MVP, norm LVF... ~  [place]is was neg- no ischemia, no infarct, EF=66%... ~  baseline EKG showed NSR, NSSTTWA (poor R prog V1-V3)... ~  EKG 1/15 showed NSR, rate60, poor R prog V1-3, NSSTTWA, NAD... ~  2DEcho 2/15 showed normal  LV size & function w/ EF=55-60%, no regional wall motion abnormalities, Gr1DD, norm valves and RV ~  Holter Monitor 2/15 showed NSR, PACs, PVCs, and no pathologic arrhythmias (the skips correspond to her subjective SOB/palpit)...  ~  She had another 30d Holter per [facility] Neurology w/ Care Everywhere report indicating PACs & PVCs, no AFib... ~  EKG 2/16 showed NSR, rate80, PVCs noted, poor R prog V1=>3, NSSTTWA... She noted incr palpit & CP => referred to Cards for their review... ~  Seen by DrArida 2/16> Cath showed minor luminal irregularities w/o obstructive CAD, norm LVF  w/ EF=55%, no signif MR; symptoms felt to be due to PVCs & Rx w/ Metoprolol improved her symptoms...  ~  2DEcho 3/16 showed norm LV size & function w/ EF=65-70%, no regional wall motion abn, Gr1DD, norm valves, norm RV & PAsys...  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 referred to [facility] w/ Angiogram confirmation & subseq stent assisted coiling of the aneurysm 11/10... subseq f/u angiogram 2/11 showed complete angiographic obliteration of the aneurysm, & patent stent in distal basilar art (there was a new 50%stenosis in the right PCA)... on ASA 325mg /d & PLAVIX restarted 2/11 w/ new gait abn & cbll infarct on MRI. ~  she saw DrMorris [facility]- interventional neuroradiology- w/ another arteriogram performed 8/11- it showed satis appearance of the basilar aneurysm stent & coiling w/ some prolapse of the coils into the P1 segm w/ mild compromise of the post cerebral art (flow looks adeq & he rec continued ASA/ Plavix therapy & f/u 40yr). ~  She had f/u DrMorris 8/12 w/ CTA that looked good- no resid aneurysm, distal flow appeared good & he didn't rec any changes- f/u planned 20yrs. ~  7/14: she is due for her f/u CTA & we will call [facility] regarding a follow up appt w/ DrMorris.. ~  9/14:  on [id number], Plavix75; last f/u DrMorris IR at [facility] 8/12 w/ CTA that looked  good- no resid aneurysm, distal flow appeared good & he didn't rec any changes; he requested that we do her f/u CTA & MRI Brain here in [place] 9/14=> good flow thru the stented basilar art, no recurrent aneurysm, no recurrent acoustic neuroma...  ~  CTA Head 9/14 showed s/p stent assisted coiling of basilar tip aneurysm, no aneurysm recurrence, patent basilar art & left post cerebral art, mild atherosclerotic calcif in cavernous carotids w/o stenosis, post op right mastoid changes w/o recurrent tumor. ~  2/15: Adm to [facility] w/ Dip[lopia & eval revealed right occipital lobe infarct, everything else was stable, continued on her ASA325/ Plavix75=> we will refer to [facility] Neurology for their review of her situation... ~  3/15: she had a busy March> f/u w/ Neuro in [place] then had another TIA & presented to the ER; CTA Head & Neck showed similar to prev, no recurrent aneurysm, & they disch her on same ASA325 + Plavix75... ~  9/15: she had f/u DrHusseini at [facility], note reviewed on Care Everywhere- felt to be stable & no changes made> rec to quit smoking & control BP, BS, Chol; they released her to f/u prn... ~  11/15: pt upset that Neuro didn't address her c/o temporal HAs, falling, weak spells- we will refer to [place] [facility] per her request...  ~  2/16: pt seen by DrJaffe> Hx basilar tip aneurysm s/p coiling 2010, right cbll infarct, right vestib schwannoma s/p surg 2002, recurrent TIA, & HAs; they reied Gabapentin=> HA improved but she developed CP & thought it was from this med therefore stopped it; she was advised to try MelatoninQhs & limit her Tramadol rx, f/u 40mo...  VENOUS INSUFFICIENCY (ICD-459.81) - she tells me that she saw DrKrush for vein surgery and treatment ("it's covered by my husb's insurance").  HYPERLIPIDEMIA (ICD-272.4) - now on PRAVASTATIN 40mg /d & tol well... we reviewed low chol/ low fat diet. ~  chart review shows TChol ~ 200 range in the 80's and early 90's. ~  then TChol incr to ~250  range in late 90's  and Lipitor started-  ~  [place] 5/08 on Lipitor 10mg /d showed TChol 164, TG 153, HDL 45, LDL 89. ~  in 2009 c/o no energy & arm discomfort she felt was from the Lip10- therefore DC'd. ~  [place] 7/09 on diet alone showed TChol 234, TG 218, HDL 40, LDL 139... rec- start Prav40. ~  FLP 3/10 on Prav40 showed TChol 182, Tg 137, HDL 47, LDL 107 ~  FLP 6/11 on Prav40 showed TChol 162, TG 230, HDL 37, LDL 88 ~  FLP 6/12 on Prav40 showed TChol 177, TG 139, HDL 44, LDL 105... Contin diet, take med every day. ~  FLP 6/13 on Prav40 showed TChol 151, TG 138, HDL 42, LDL 81  ~  FLP 7/14 on Prav40 showed TChol 174, TG 68, HDL 58, LDL 103 ~  FLP 5/15 on Prav40 showed TChol 164, TG 120, HDL 49, LDL 91 ~  FLP 11/15 on Prav40 showed TChol 168, TG 129, HDL 43, LDL 99  ~  FLP 11/16 on Prav40 showed TChol 140, TG 112, HDL 43, LDL 75  HYPOTHYROIDISM (ICD-244.9) - stable on LEVOTHYROID 65mcg/d now... hx Hashimoto's thyroiditis in 1989 w/ markedly pos antimicrosomal antibodies and transient hyperthy labs...  ~  labs 5/08 showed TSH= 0.53... rec- continue Levo100/d. ~  labs 7/09 showed TSH= 0.07... rec- decr Levothy100 to 1/2 daily. ~  labs 3/10 showed TSH= 17.71... rec> incr back to 143mcg/d. ~  labs 9/10 showed TSH= 0.21... continue same. ~  labs 6/11 showed TSH= 0.10... rec decr Levo100 to 1/2 daily. ~  labs 10/11 on Levo50 showed TSH= 1.25 (FreeT3 & FreeT4 normal) ~  Labs 6/12 on Levo50 showed TSH= 12.56 ==> we will contact pt & pharm to determine if taking Levo100-1/2tab daily every day? & adjust. ~  Labs 12/12 on Levo75 showed TSH= 0.88... Continue same. ~  Labs 6/13 on Levo75 showed TSH= 0.71... Continue same. ~  Labs 7/14 on Levo75 showed TSH= 2.47 ~  Labs 5/15 on Levo75 showed TSH= 0.25 and we rec decr to Levothy50... ~  Labs 11/15 on Levo50 showed TSH= 9.91 and she is rec to go back on Synthroid75 ~  Labs 11/16 on Levo75 showed TSH=0.22 and rec to continue same for now... ~  Labs 5/17 on  Levo75 showed TSH= 0.48  IRRITABLE BOWEL SYNDROME (ICD-564.1) - had colonoscopy by [facility] 11/06 that was WNL.Marland Kitchen. she notes some constipation & Rx w/ colase/ senakot-S OTC...  NEW PROBLEM 10/23/17 >> RLQ pain of 3wk duration => w/u in progress  NEPHROLITHIASIS (ICD-592.0) - last stone passed in 1993, no known recurrence since then...  Hx of TENDINITIS, RIGHT ELBOW (ICD-727.09) Hx right shoulder pain w/ eval by DrNorris (MRI was planned butnever done & symptoms improved spont)... FIBROMYALGIA (ICD-729.1) - prev severe symptoms may have reflected a flair in her FM> but much improved on Pred Rx. ~  labs 9/10 showed Sed= 24 ~  labs 6/11 showed Sed= 30 ~  labs 10/11 showed CBC=norm, Chems=norm, TFTs=norm, Sed=21, RF=neg, ANA+1:40 speckled... ~  12/11:  pt saw DrTruslow & his note is pending- she indicates he stopped her Pred, & gave her shot in shoulder.  OSTEOPENIA (ICD-733.90) - BMD here 7/09 showed TScores -0.8 in Spine, and -1.2 in right FemNeck... rec to take Calcium, MVI, Vit D... ~  BMD repeated 2/15 & showed TScores -0.6 in Spine, and -1.6 in right FemNeck; rec to stay on Calcium, MVI, VitD, & wt bearing exercise...  VITAMIN D DEFICIENCY (ICD-268.9) ~  labs 7/09  showed Vit D level = 16... rec- start Vit D 50K weekly. ~  labs 3/10 showed Vit D level = 69... rec> change to 1000 u daily. ~  labs 6/11 showed Vit D level = 55... Continue same. ~  Labs 6/13 showed Vit D level = 53... Continue same. ~  Labs 7/14 showed Vit D level = 67  Hx of ACOUSTIC NEUROMA (ICD-225.1) - s/p surg for this tumor 10/02 at [facility] Dr[facility]rowne... no known recurrence... ~  MRI Brain 2/07 showed s/p translabyrinthine approach to right acoustic neuroma w/o recurrence, NAD... 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 ~  MRI Brain 2/11 showed no recurrence/ post op changes, endovasc oblit of basilar aneurysm, cerebellar lacunar infarct & 50% stenosis of  right PCA... PLAVIX restarted... she had review by [facility] IR & Neurology (as above)... ~  We discussed f/u MRI since DrBrowne hasn't seen her since 2011 (?he turned her over to DrMorris)... ~  9/14:  MRI Brain showed no evid of resid or recurrent vestib schwannoma on the right, post surg changes w/ scarring in the int auditory canal, ols sm vessel cerebellar infarctions are stable, mild atrophy & sm vessel dis, prev coiled basilar tip aneurysm, no acute insults...  ~  MRI/ MRA Brain 2/15 at [facility] showed acute infarct in right occipital lobe, chronic infarcts in cerebellum bilat, aneurysm coiling of basilar art tip and stent in basilar art.. ~  CT Angio Head & Neck 3/15 in [place] showed 40% narrowing of prox left subclavian art; 40% RICAstenosis & 53% LICAstenosis; aneurysm coiling & stenting of basilar tip aneurysm...  RLS symptoms >> she complained 1/14 about new onset RLS symptoms- discussed trial Mirapex 0.25mg  prn...  ANXIETY (ICD-300.00) - uses CHLORAZEPATE 7.5mg Tid... she gets claustrophobic and needs sedation before MRIs.   Past Surgical History:  Procedure Laterality Date  . ANEURYSM COILING  11-10   Dr. Estanislado Pandy  . BREAST LUMPECTOMY Right 1986   benign  . DIAGNOSTIC LAPAROSCOPY     Proable laparoscopic right colectomy 03-14-18 Dr. Excell Seltzer  . EYE SURGERY Bilateral    ioc lens for cataracts  . LAPAROSCOPIC RIGHT COLECTOMY Right 03/14/2018   Procedure: LAPAROSCOPIC ASSISTED  RIGHT COLECTOMY;  Surgeon: Excell Seltzer, MD;  [facility];  Service: General;  Laterality: Right;  . LAPAROSCOPY N/A 03/14/2018   Procedure: LAPAROSCOPY;  Surgeon: Excell Seltzer, MD;  [facility]: [facility];  Service: General;  Laterality: N/A;  . LEFT HEART CATHETERIZATION WITH CORONARY ANGIOGRAM N/A 08/05/2014   Procedure: LEFT HEART CATHETERIZATION WITH CORONARY ANGIOGRAM;  Surgeon: Wellington Hampshire, MD;  Location: [place] CATH LAB;  Service: Cardiovascular;  Laterality: N/A;  . TONSILLECTOMY    .  TOTAL ABDOMINAL HYSTERECTOMY  1970   complete  . TRANSLABYRINTHINE PROCEDURE  2002   [facility] Dr. Vicie Mutters tumor removal  . TRANSURETHRAL RESECTION OF BLADDER TUMOR N/A 02/08/2018   Procedure: TRANSURETHRAL RESECTION OF BLADDER TUMOR (TURBT)/ POSTOPERATIVE INSTILLATION OF CHEMO THERAPY;  Surgeon: Festus Aloe, MD;  Location: [facility];  Service: Urology;  Laterality: N/A;    Outpatient Encounter Medications as of 05/22/2018  Medication Sig  . acetaminophen (TYLENOL) 500 MG tablet Take 500 mg by mouth every 6 (six) hours as needed for mild pain.   . cholecalciferol (VITAMIN D) 1000 UNITS tablet Take 1,000 Units by mouth daily.    . clorazepate (TRANXENE) 7.5 MG tablet Take 1 tablet (7.5 mg total) by mouth 3 (three)  times daily as needed for anxiety.  . hydroxypropyl methylcellulose / hypromellose (ISOPTO TEARS / GONIOVISC) 2.5 % ophthalmic solution Place 1 drop into both eyes 2 (two) times daily.   Marland Kitchen levothyroxine (SYNTHROID, LEVOTHROID) 75 MCG tablet TAKE 1 TABLET DAILY BEFORE BREAKFAST (Patient taking differently: Take 75 mcg by mouth daily before breakfast. )  . metoprolol tartrate (LOPRESSOR) 25 MG tablet TAKE 1 TABLET TWICE A DAY (Patient taking differently: Take 25 mg by mouth 2 (two) times daily. )  . pramipexole (MIRAPEX) 0.25 MG tablet TAKE 1 TABLET AT BEDTIME FOR RESTLESS LEG SYMPTOMS (Patient taking differently: Take 0.25 mg by mouth at bedtime. TAKE 1 TABLET AT BEDTIME FOR RESTLESS LEG SYMPTOMS)  . pravastatin (PRAVACHOL) 40 MG tablet TAKE 1 TABLET DAILY (Patient taking differently: Take 40 mg by mouth at bedtime. )  . traMADol (ULTRAM) 50 MG tablet Take 1 tablet (50 mg total) by mouth 3 (three) times daily as needed for moderate pain.  . megestrol (MEGACE ORAL) 40 MG/ML suspension Take 49ml by mouth Three times a day before meals  . [DISCONTINUED] ondansetron (ZOFRAN) 4 MG tablet Take 1 tablet (4 mg total) by mouth every 8 (eight) hours as needed for nausea or vomiting.   . [DISCONTINUED] oxyCODONE (OXY IR/ROXICODONE) 5 MG immediate release tablet Take 1-2 tablets (5-10 mg total) by mouth every 6 (six) hours as needed for severe pain.   No facility-administered encounter medications on file as of 05/22/2018.     Allergies  Allergen Reactions  . Atorvastatin Other (See Comments)     Lipitor caused arm pain (3/09)  . Hydrocodone-Acetaminophen Other (See Comments)    hallucinations  . Morphine Other (See Comments)    hallucinations  . Nortriptyline     Caused nausea and vomiting and shaking, kept her up all night  . Topamax [Topiramate] Nausea And Vomiting    *dizziness*    Immunization History  Administered Date(s) Administered  . Influenza Split 06/09/2011, 03/29/2012, 03/12/2018  . Influenza Whole 03/12/2009, 03/29/2010  . Influenza, High Dose Seasonal PF 03/17/2016, 04/25/2017  . Influenza,inj,Quad PF,6+ Mos 05/22/2013, 04/17/2014, 04/22/2015  . Pneumococcal Conjugate-13 10/23/2016  . Pneumococcal Polysaccharide-23 12/18/2007    Current Medications, Allergies, Past Medical History, Past Surgical History, Family History, and Social History were reviewed in [employer] record.   Review of Systems         See HPI - all other systems neg except as noted... The patient complains of dyspnea on exertion, recent palpitations & HAs.  The patient denies anorexia, fever, weight loss, weight gain, vision loss, decreased hearing, hoarseness, chest pain, syncope, peripheral edema, prolonged cough, hemoptysis, abdominal pain, melena, hematochezia, severe indigestion/heartburn, hematuria, incontinence, muscle weakness, suspicious skin lesions, transient blindness, difficulty walking, depression, unusual weight change, abnormal bleeding, enlarged lymph nodes, and angioedema.     Objective:   Physical Exam     WD, WN, 77 y/o WF in NAD... GENERAL:  Alert & oriented; pleasant & cooperative... HEENT:  [place]/AT, EOM-wnl, PERRLA, EACs-clear,  TMs- some scarring on right, NOSE-clear, THROAT-clear & wnl. NECK:  Supple w/ fairROM; no JVD; normal carotid impulses w/o bruits; palp thyroid, no nodules felt; no lymphadenopathy. CHEST:  Clear to P & A; without wheezes/ rales/ or rhonchi heard... HEART:  Regular Rhythm; gr1/6 SEM,  no irregularity, rubs or gallops heard... ABDOMEN:  Soft & nontender; normal bowel sounds; no organomegaly or masses detected. EXT: without deformities, mild arthritic changes; no varicose veins/ venous insuffic/ or edema... +trigger points about the neck/  shoulders w/ decr ROM right shoulder w/ pain... NEURO:  CN's intact; no focal neuro deficits... gait & station are normal. DERM:  No lesions noted; no rash etc...  RADIOLOGY DATA:  Reviewed in the EPIC EMR & discussed w/ the patient...  LABORATORY DATA:  Reviewed in the EPIC EMR & discussed w/ the patient...   Assessment & Plan:    10/23/16>   Ana Santos is stable given her severe underlying dis & continued smoking habit;  She is asked to quit all smoking, take meds regularly as discussed & we gave her the PREVNAR-13 vaccination today;  We plan ROV recheck in 7mo w/ CXR, EKG, FASTING blood work 04/25/17>   Ana Santos remains stable but continues to smoke ~4cig/d & she is again implored to quit!  Give 2018 FLU vaccine today;  Reminded to stay as active as poss w/ home exercises during the winter; we plan ROV recheck in 34mo, sooner if needed prn... 10/23/17>   RLQ pain w/ abn CT Abd as above- DDx betw ischemic colitis vs infectious or inflamm enterocolitis; I discussed situation w/ GI-DrArmbruster via phone today => rec treatment w/ CIPRO/ FLAGYL, liq diet & they will get her into the GI office ASAP to check pt & arrange for colonoscopy;  Pt understands that if pain increases or she develops vomiting, diarrhea, blood, etc that she is to go immediately to the ER for ADM & in-patient eval. 05/22/18>   As documented above- we need to consider restart of ASA/Plavix due to her  severe vasc dis;  Based on current labs- start K10-2/d, FeSO4+VitC, B12 orally, Pred dosepak; she will need short term follow up in 31month.  We discussed my upcoming retirement Jan2020 & she will seek new PCP at [facility] clinic...    COPD/ Smoker>  She notes min cough/ sputum/ etc; desperately needs to quit smokng- offered counseling, medications, etc but she declines...  Hx mild MVP>  No MVP seen on 2DEcho 2/15, mild MVP was noted on cath 1995 by DrStuckey...  PALPITATIONS>> Prev eval w/ CXR, EKG, 2DEcho & Holter monitor=> all studies OK/NEG as above (x PACs/ PVCs); we stressed the importance of no nicotine, no caffeine, etc... 2/16> she presented w/ incr symptoms over the last month; rec to start Metop25- 1/2Bid & refer to Cards for their review... 2/16> seen by DrArida w/ Cath, repeat 2DEcho- both neg & palpit/ PVCs improved w/ Metoprolol rx...  Hx right sided Acoustic Neuroma- s/p surg 2002 at [facility] DrBrowne> no known recurrence to date including MRI 9/14...  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 Another TIA 3/15>  eval via ER w/ CTA Head & Neck- stable, no changes made to meds- rec to continue ASA/ Plavix, AND QUIT SMOKING.... She was released by [facility] Neuro- DrHusseini & asked  to quit smoking, continue ASA/Plavix, keep BP/ BS/ Chol under  control... 11/15> pt c/o HAs, falls, weakness & requests Neuro 2nd opinion at [facility]... 2/16> seen by [facility] for HAs & neuro 2nd opinion...  HYPERLIPID>  On Prav40 + diet;  FLP looks OK but needs better diet & take med Qhs...  HYPOTHYROID>  Back on Levothy 53mcg/d now w/ TSH= 10.82 on the 64mcg/d dose...  IBS w/ Constip>  She uses OTC laxatives as needed & we discussed Miralax/ Senakot-S/ etc...  ORTHO>  Prev right shoulder discomfort improved spont & MRI was never done; currently uses Tramadol Prn... ~  2017> c/o neck & back pain; DrJaffe sent her to [facility] & he has sched back injections...  OSTEOPENIA/ Vit D defic>  Stable on Calcium, MVI, Vit D supplement; due for f/u BMD=> pending...  RLS symptoms>  trial Mirapex 0.25mg  hs prn...  Hx Chronic daily HAs>>  HAs improved spontaneously- on Tramadol & Tylenol w/ some relief of pain; she tried Topamax but intol w/ dizzy & nausea... 11/15> presented w/ incr HAs and referred to Neuro; they tried Neurontin w/ decr HAs but intol she says...  ANXIETY>  Uses Tranxene Prn (she has some claustrophobia & needs sedation before MRIs- she doesn't remember the 2/11 MRI being done!).Marland KitchenMarland Kitchen   Patient's Medications  New Prescriptions      FeSO4 325mg /d + VitC 500mg /d       B12  1071mcg tab daily        MEGESTROL (MEGACE ORAL) 40 MG/ML SUSPENSION    Take 67ml by mouth Three times a day before meals   POTASSIUM CHLORIDE (K-DUR) 10 MEQ TABLET    Take 2 tabs daily   PREDNISONE (STERAPRED UNI-PAK 21 TAB) 5 MG (21) TBPK TABLET    Take as directed  Previous Medications   ACETAMINOPHEN (TYLENOL) 500 MG TABLET    Take 500 mg by mouth every 6 (six) hours as needed for mild pain.    CHOLECALCIFEROL (VITAMIN D) 1000 UNITS TABLET    Take 1,000 Units by mouth daily.     CLORAZEPATE (TRANXENE) 7.5 MG TABLET    Take 1 tablet (7.5 mg total) by mouth 3 (three) times daily as needed for anxiety.   HYDROXYPROPYL METHYLCELLULOSE / HYPROMELLOSE (ISOPTO TEARS / GONIOVISC)  2.5 % OPHTHALMIC SOLUTION    Place 1 drop into both eyes 2 (two) times daily.    LEVOTHYROXINE (SYNTHROID, LEVOTHROID) 75 MCG TABLET    TAKE 1 TABLET DAILY BEFORE BREAKFAST   METOPROLOL TARTRATE (LOPRESSOR) 25 MG TABLET    TAKE 1 TABLET TWICE A DAY   PRAMIPEXOLE (MIRAPEX) 0.25 MG TABLET    TAKE 1 TABLET AT BEDTIME FOR RESTLESS LEG SYMPTOMS   PRAVASTATIN (PRAVACHOL) 40 MG TABLET    TAKE 1 TABLET DAILY   TRAMADOL (ULTRAM) 50 MG TABLET    Take 1 tablet (50 mg total) by mouth 3 (three) times daily as needed for moderate pain.  Modified Medications   No medications on file  Discontinued Medications   ONDANSETRON (ZOFRAN) 4 MG TABLET    Take 1 tablet (4 mg total) by mouth every 8 (eight) hours as needed for nausea or vomiting.   OXYCODONE (OXY IR/ROXICODONE) 5 MG IMMEDIATE RELEASE TABLET    Take 1-2 tablets (5-10 mg total) by mouth every 6 (six) hours as needed for severe pain.

## 2018-06-13 ENCOUNTER — Ambulatory Visit (INDEPENDENT_AMBULATORY_CARE_PROVIDER_SITE_OTHER): Payer: Medicare Other | Admitting: Family Medicine

## 2018-06-13 ENCOUNTER — Encounter: Payer: Self-pay | Admitting: Family Medicine

## 2018-06-13 VITALS — BP 120/70 | HR 68 | Temp 98.0°F | Ht 63.0 in | Wt 113.0 lb

## 2018-06-13 DIAGNOSIS — Z7689 Persons encountering health services in other specified circumstances: Secondary | ICD-10-CM | POA: Diagnosis not present

## 2018-06-13 DIAGNOSIS — E782 Mixed hyperlipidemia: Secondary | ICD-10-CM

## 2018-06-13 DIAGNOSIS — E039 Hypothyroidism, unspecified: Secondary | ICD-10-CM

## 2018-06-13 DIAGNOSIS — I6350 Cerebral infarction due to unspecified occlusion or stenosis of unspecified cerebral artery: Secondary | ICD-10-CM | POA: Diagnosis not present

## 2018-06-13 DIAGNOSIS — J449 Chronic obstructive pulmonary disease, unspecified: Secondary | ICD-10-CM

## 2018-06-13 MED ORDER — CLOPIDOGREL BISULFATE 75 MG PO TABS: 75.0000 mg | ORAL_TABLET | Freq: Every day | ORAL | 3 refills | Status: DC

## 2018-06-13 NOTE — Progress Notes (Signed)
Ana Santos is a 78 y.o. female  Chief Complaint  Patient presents with  . Establish Care    pt here to establish care    HPI: Ana Santos is a 78 y.o. female here to establish care with our office. She is a former patient of Dr. Teressa Lower.  Pt is married, 5 sons (4 living), 14 grandchildren, 4 great grandchildren. She states the last time she saw Dr. Lenna Gilford she was complaining of lack of energy, poor appetite. He recommended potassium, B12, iron, Vit C daily and she feels this has helped her symptoms.  Specialists: pulmonary, GI, Gen Surg  Med refills needed today: plavix - sent to mail order pharm   Past Medical History:  Diagnosis Date  . AN (acoustic neuroma) ([place])   . Aneurysm ([place])   . Anxiety   . Cataract   . Cerebrovascular disease   . Cigarette smoker   . COPD (chronic obstructive pulmonary disease) (HCC)    mild, no inhalers or oxygen used  . Coronary artery disease    no current cardiologist  pt. denies  . Deafness in right ear   . Depression   . Headache    tension headaches due to aneurysm repair with stent  . Heart murmur   . History of kidney stones    15 to 20 yrs ago  . Hyperlipidemia   . Hypothyroidism   . IBS (irritable bowel syndrome)   . Mitral valve prolapse    mild takes beta blocker  . Osteopenia   . Palpitations   . Stroke ([place])    x 3 ministrokes last one feb 2014  . Venous insufficiency   . Vitamin D deficiency     Past Surgical History:  Procedure Laterality Date  . ANEURYSM COILING  11-10   Dr. Estanislado Pandy  . BREAST LUMPECTOMY Right 1986   benign  . DIAGNOSTIC LAPAROSCOPY     Proable laparoscopic right colectomy 03-14-18 Dr. Excell Seltzer  . EYE SURGERY Bilateral    ioc lens for cataracts  . LAPAROSCOPIC RIGHT COLECTOMY Right 03/14/2018   Procedure: LAPAROSCOPIC ASSISTED  RIGHT COLECTOMY;  Surgeon: Excell Seltzer, MD;  [facility];  Service: General;  Laterality: Right;  . LAPAROSCOPY N/A 03/14/2018   Procedure: LAPAROSCOPY;  Surgeon: Excell Seltzer, MD;  [facility];  Service: General;  Laterality: N/A;  . LEFT HEART CATHETERIZATION WITH CORONARY ANGIOGRAM N/A 08/05/2014   Procedure: LEFT HEART CATHETERIZATION WITH CORONARY ANGIOGRAM;  Surgeon: Wellington Hampshire, MD;  Location: [place] CATH LAB;  Service: Cardiovascular;  Laterality: N/A;  . TONSILLECTOMY    . TOTAL ABDOMINAL HYSTERECTOMY  1970   complete  . TRANSLABYRINTHINE PROCEDURE  2002   [facility] Dr. Vicie Mutters tumor removal  . TRANSURETHRAL RESECTION OF BLADDER TUMOR N/A 02/08/2018   Procedure: TRANSURETHRAL RESECTION OF BLADDER TUMOR (TURBT)/ POSTOPERATIVE INSTILLATION OF CHEMO THERAPY;  Surgeon: Festus Aloe, MD;  Location: [facility];  Service: Urology;  Laterality: N/A;    Social History   Socioeconomic History  . Marital status: Married    Spouse name: Not on file  . Number of children: Not on file  . Years of education: Not on file  . Highest education level: Not on file  Occupational History  . Occupation: alterations  Social Needs  . Financial resource strain: Not on file  . Food insecurity:    Worry: Not on file    Inability: Not on file  . Transportation needs:    Medical: Not on  file    Non-medical: Not on file  Tobacco Use  . Smoking status: Current Every Day Smoker    Packs/day: 0.25    Years: 50.00    Pack years: 12.50    Types: Cigarettes  . Smokeless tobacco: Never Used  . Tobacco comment: 4-5 cigs daily  is aware she needs to quit  Substance and Sexual Activity  . Alcohol use: No    Alcohol/week: 0.0 standard drinks  . Drug use: No  . Sexual activity: Not Currently  Lifestyle  . Physical activity:    Days per week: Not on file    Minutes per session: Not on file  . Stress: Not on file  Relationships  . Social connections:    Talks on phone: Not on file    Gets together: Not on file    Attends religious service: Not on file    Active member of club or organization: Not on  file    Attends meetings of clubs or organizations: Not on file    Relationship status: Not on file  . Intimate partner violence:    Fear of current or ex partner: Not on file    Emotionally abused: Not on file    Physically abused: Not on file    Forced sexual activity: Not on file  Other Topics Concern  . Not on file  Social History Narrative   Pt is married, 5 sons (4 living), 14 grandchildren, 4 great grandchildren.   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 Heart attack Mother   . Diabetes Mother   . Cancer Father   . Diabetes Sister   . Diabetes Brother   . Cancer Sister        unkown type  . Lung cancer Brother        Lung  . AAA (abdominal aortic aneurysm) Neg Hx   . Colon cancer Neg Hx   . Stomach cancer Neg Hx   . Esophageal cancer Neg Hx   . Liver cancer Neg Hx   . Pancreatic cancer Neg Hx   . Rectal cancer Neg Hx      Immunization History  Administered Date(s) Administered  . Influenza Split 06/09/2011, 03/29/2012, 03/12/2018  . Influenza Whole 03/12/2009, 03/29/2010  . Influenza, High Dose Seasonal PF 03/17/2016, 04/25/2017  . Influenza,inj,Quad PF,6+ Mos 05/22/2013, 04/17/2014, 04/22/2015  . Pneumococcal Conjugate-13 10/23/2016  . Pneumococcal Polysaccharide-23 12/18/2007    Outpatient Encounter Medications as of 06/13/2018  Medication Sig  . acetaminophen (TYLENOL) 500 MG tablet Take 500 mg by mouth every 6 (six) hours as needed for mild pain.   . cholecalciferol (VITAMIN D) 1000 UNITS tablet Take 1,000 Units by mouth daily.    . clorazepate (TRANXENE) 7.5 MG tablet Take 1 tablet (7.5 mg total) by mouth 3 (three) times daily as needed for anxiety.  . hydroxypropyl methylcellulose / hypromellose (ISOPTO TEARS / GONIOVISC) 2.5 % ophthalmic solution Place 1 drop into both eyes 2 (two) times daily.   Marland Kitchen levothyroxine (SYNTHROID, LEVOTHROID) 75 MCG tablet TAKE 1 TABLET  DAILY BEFORE BREAKFAST (Patient taking differently: Take 75 mcg by mouth daily before breakfast. )  . metoprolol tartrate (LOPRESSOR) 25 MG tablet TAKE 1 TABLET TWICE A DAY (Patient taking differently: Take 25 mg by mouth 2 (two) times daily. )  . potassium chloride (K-DUR) 10 MEQ tablet Take 2 tabs daily  . pramipexole (MIRAPEX) 0.25 MG  tablet TAKE 1 TABLET AT BEDTIME FOR RESTLESS LEG SYMPTOMS (Patient taking differently: Take 0.25 mg by mouth at bedtime. TAKE 1 TABLET AT BEDTIME FOR RESTLESS LEG SYMPTOMS)  . pravastatin (PRAVACHOL) 40 MG tablet TAKE 1 TABLET DAILY (Patient taking differently: Take 40 mg by mouth at bedtime. )  . traMADol (ULTRAM) 50 MG tablet Take 1 tablet (50 mg total) by mouth 3 (three) times daily as needed for moderate pain.  Marland Kitchen clopidogrel (PLAVIX) 75 MG tablet Take 1 tablet (75 mg total) by mouth daily.  . megestrol (MEGACE ORAL) 40 MG/ML suspension Take 34ml by mouth Three times a day before meals (Patient not taking: Reported on 06/13/2018)  . predniSONE (STERAPRED UNI-PAK 21 TAB) 5 MG (21) TBPK tablet Take as directed (Patient not taking: Reported on 06/13/2018)   No facility-administered encounter medications on file as of 06/13/2018.      ROS: Gen: no fever, chills  Skin: no rash, itching ENT: no ear pain, ear drainage, nasal congestion, rhinorrhea, sinus pressure, sore throat; pt endorses difficulty hearing (chronic) Eyes: no blurry vision, double vision Resp: no cough, wheeze,SOB CV: no CP, palpitations, LE edema,  GI: no heartburn, n/v/d/c, abd pain GU: no dysuria, urgency, frequency, hematuria  MSK: no joint pain, myalgias, back pain Neuro: no dizziness, headache, weakness, vertigo Psych: no depression, anxiety, insomnia   Allergies  Allergen Reactions  . Atorvastatin Other (See Comments)     Lipitor caused arm pain (3/09)  . Hydrocodone-Acetaminophen Other (See Comments)    hallucinations  . Morphine Other (See Comments)    hallucinations  .  Nortriptyline     Caused nausea and vomiting and shaking, kept her up all night  . Topamax [Topiramate] Nausea And Vomiting    *dizziness*    BP 120/70   Pulse 68   Temp 98 F (36.7 C) (Oral)   Ht 5\' 3"  (1.6 m)   Wt 113 lb (51.3 kg)   SpO2 99%   BMI 20.02 kg/m   Physical Exam  Constitutional: She is oriented to person, place, and time. She appears well-developed and well-nourished. No distress.  Neck: Normal range of motion. Neck supple. No JVD present.  Cardiovascular: Normal rate, regular rhythm, normal heart sounds and intact distal pulses.  Pulmonary/Chest: Effort normal and breath sounds normal. No respiratory distress. She has no wheezes. She has no rhonchi.  Musculoskeletal:        General: No edema.  Lymphadenopathy:    She has no cervical adenopathy.  Neurological: She is alert and oriented to person, place, and time.  Skin: Skin is warm and dry.  Psychiatric: She has a normal mood and affect. Her behavior is normal.     A/P: 1. Encounter to establish care with new doctor - will plan to see pt q6 mo and PRN  2. Cerebral artery occlusion with cerebral infarction (HCC) - stable Refill: - clopidogrel (PLAVIX) 75 MG tablet; Take 1 tablet (75 mg total) by mouth daily.  Dispense: 90 tablet; Refill: 3  3. COPD (chronic obstructive pulmonary disease) with chronic bronchitis (HCC) - chronic issue, stable at this time - cont current treatment regimen and regular f/u with pulm  4. Hypothyroidism, unspecified type - TSH WNL in 05/2018 - cont current dose of levothyroxine  5. Mixed hyperlipidemia - cont current med

## 2018-07-09 ENCOUNTER — Other Ambulatory Visit: Payer: Self-pay | Admitting: Family Medicine

## 2018-07-09 NOTE — Telephone Encounter (Signed)
Copied from [place] [id number]. Topic: Quick Communication - Rx Refill/Question >> Jul 09, 2018  1:02 PM Bea Graff, NT wrote: Medication: traMADol (ULTRAM) 50 MG tablet   Has the patient contacted their pharmacy? Yes.   (Agent: If no, request that the patient contact the pharmacy for the refill.) (Agent: If yes, when and what did the pharmacy advise?)  Preferred Pharmacy (with phone number or street name): [facility] [place], [place] [phone] (Phone) [phone] (Fax)    Agent: Please be advised that RX refills may take up to 3 business days. We ask that you follow-up with your pharmacy.

## 2018-07-18 MED ORDER — TRAMADOL HCL 50 MG PO TABS: 50.0000 mg | ORAL_TABLET | Freq: Three times a day (TID) | ORAL | 1 refills | Status: DC | PRN

## 2018-07-18 NOTE — Telephone Encounter (Signed)
Dr C. Please advise pt. Is requesting rx refill for tramadol 50 mg tab 3 x daily PRN last refilled by Teressa Lower.  Last OV with you was 06/13/2018. Pt states she is down to couple days left of rx and [facility].

## 2018-07-18 NOTE — Addendum Note (Signed)
Addended by: Ronnald Nian on: 07/18/2018 02:07 PM   Modules accepted: Orders

## 2018-07-18 NOTE — Telephone Encounter (Signed)
Rx refilled. At next OV, pt will need UDS and controlled substance agreement

## 2018-07-18 NOTE — Telephone Encounter (Signed)
Pt calling to f/u on RX for tramadol - pt is down to a couple days of medication and this comes from mail order ([facility]). Please advise.   Last OV 06/13/2018 Last RX traMADol (ULTRAM) 50 MG tablet #90 tablets 1 refill

## 2018-07-18 NOTE — Telephone Encounter (Signed)
Left pt VM that rx was refilled and that if she had any questions to give [place] a call.

## 2018-07-29 ENCOUNTER — Telehealth: Payer: Self-pay | Admitting: Family Medicine

## 2018-07-29 DIAGNOSIS — R519 Headache, unspecified: Secondary | ICD-10-CM

## 2018-07-29 DIAGNOSIS — G8929 Other chronic pain: Secondary | ICD-10-CM

## 2018-07-29 DIAGNOSIS — R51 Headache: Principal | ICD-10-CM

## 2018-07-29 MED ORDER — TRAMADOL HCL 50 MG PO TABS: 50.0000 mg | ORAL_TABLET | Freq: Three times a day (TID) | ORAL | 0 refills | Status: DC | PRN

## 2018-07-29 NOTE — Telephone Encounter (Signed)
Ana Santos please advise Dr. Loletha Santos is out of the office until 07/31/2018 [facility] not receive this rx refill on 07/19/2018.Can you help with this.She wants a temporary refill sent to [facility] [place]

## 2018-07-29 NOTE — Telephone Encounter (Signed)
Left pt detailed VM to let her know that temporary refill was sent in to her pharmacy until Wednesday. Told her if she had any questions to call back.

## 2018-07-29 NOTE — Telephone Encounter (Signed)
Patient understood she will only be able to get 6 tablets sent to  [place], [place] [phone] (Phone) [phone] (Fax)   Until her doctor comes back in the office on Wednesday.

## 2018-07-29 NOTE — Telephone Encounter (Signed)
Sent 9tabs to local pharmacy

## 2018-07-29 NOTE — Telephone Encounter (Signed)
[facility] stated they haven't received med request for Tramadol. And these are the updated numbers.   [phone] ( Fax)  [phone] ( Verbal )

## 2018-07-29 NOTE — Telephone Encounter (Signed)
Copied from [place] [id number]. Topic: Quick Communication - See Telephone Encounter >> Jul 29, 2018  9:14 AM Blase Mess A wrote: CRM for notification. See Telephone encounter for: 07/29/18.Patient is calling because the traMADol (ULTRAM) 50 MG tablet [[id number]] was not [facility].  Requesting a 30 day supply to be sent to [place], [place] [phone] (Phone) [phone] (Fax)  Please advise.

## 2018-07-31 MED ORDER — TRAMADOL HCL 50 MG PO TABS: 50.0000 mg | ORAL_TABLET | Freq: Two times a day (BID) | ORAL | 0 refills | Status: DC | PRN

## 2018-07-31 NOTE — Telephone Encounter (Signed)
Dr. Loletha Grayer please advise [facility] didn't get this rx refill. [place] sent a temporary refill of 6 tabs to her until you returned to office.

## 2018-07-31 NOTE — Telephone Encounter (Signed)
[facility]

## 2018-08-02 ENCOUNTER — Other Ambulatory Visit (HOSPITAL_COMMUNITY)
Admission: RE | Admit: 2018-08-02 | Discharge: 2018-08-02 | Disposition: A | Discharge status: Home / Self Care | Payer: Medicare Other | Source: Ambulatory Visit | Attending: Internal Medicine | Admitting: Internal Medicine

## 2018-08-02 DIAGNOSIS — K658 Other peritonitis: Secondary | ICD-10-CM | POA: Diagnosis not present

## 2018-08-02 DIAGNOSIS — D360 Benign neoplasm of lymph nodes: Secondary | ICD-10-CM | POA: Diagnosis not present

## 2018-09-09 DIAGNOSIS — Z8551 Personal history of malignant neoplasm of bladder: Secondary | ICD-10-CM | POA: Diagnosis not present

## 2018-09-27 ENCOUNTER — Other Ambulatory Visit: Payer: Self-pay | Admitting: Family Medicine

## 2018-09-27 MED ORDER — PRAMIPEXOLE DIHYDROCHLORIDE 0.25 MG PO TABS: 0.2500 mg | ORAL_TABLET | Freq: Every day | ORAL | 0 refills | Status: DC

## 2018-09-27 MED ORDER — PRAVASTATIN SODIUM 40 MG PO TABS: 40.0000 mg | ORAL_TABLET | Freq: Every day | ORAL | 0 refills | Status: DC

## 2018-09-27 NOTE — Telephone Encounter (Signed)
Requested Prescriptions  Pending Prescriptions Disp Refills  . pravastatin (PRAVACHOL) 40 MG tablet 90 tablet 0    Sig: Take 1 tablet (40 mg total) by mouth daily.     Cardiovascular:  Antilipid - Statins Failed - 09/27/2018  1:16 PM      Failed - Total Cholesterol in normal range and within 360 days    Cholesterol  Date Value Ref Range Status  04/25/2017 139 0 - 200 mg/dL Final    Comment:    ATP III Classification       Desirable:  < 200 mg/dL               Borderline High:  200 - 239 mg/dL          High:  > = 240 mg/dL         Failed - LDL in normal range and within 360 days    LDL Cholesterol  Date Value Ref Range Status  04/25/2017 69 0 - 99 mg/dL Final         Failed - HDL in normal range and within 360 days    HDL  Date Value Ref Range Status  04/25/2017 48.70 >39.00 mg/dL Final         Failed - Triglycerides in normal range and within 360 days    Triglycerides  Date Value Ref Range Status  04/25/2017 107.0 0.0 - 149.0 mg/dL Final    Comment:    Normal:  <150 mg/dLBorderline High:  150 - 199 mg/dL         Passed - Patient is not pregnant      Passed - Valid encounter within last 12 months    Recent Outpatient Visits          3 months ago Encounter to establish care with new doctor   [facility] [place], Garvin Fila, DO      Future Appointments            In 2 months Bruce, Garvin Fila, DO [facility], [place]         . pramipexole (MIRAPEX) 0.25 MG tablet 90 tablet 2     Neurology:  Parkinsonian Agents Passed - 09/27/2018  1:16 PM      Passed - Last BP in normal range    BP Readings from Last 1 Encounters:  06/13/18 120/70         Passed - Valid encounter within last 12 months    Recent Outpatient Visits          3 months ago Encounter to establish care with new doctor   [facility] [place], Garvin Fila, DO      Future Appointments            In 2 months Cirigliano, Garvin Fila, DO [facility]  [place]

## 2018-09-30 DIAGNOSIS — K120 Recurrent oral aphthae: Secondary | ICD-10-CM | POA: Diagnosis not present

## 2018-10-03 ENCOUNTER — Encounter: Payer: Self-pay | Admitting: Family Medicine

## 2018-10-04 ENCOUNTER — Other Ambulatory Visit: Payer: Self-pay | Admitting: Pulmonary Disease

## 2018-11-05 ENCOUNTER — Encounter: Payer: Self-pay | Admitting: Family Medicine

## 2018-11-05 ENCOUNTER — Ambulatory Visit (INDEPENDENT_AMBULATORY_CARE_PROVIDER_SITE_OTHER): Payer: Medicare Other | Admitting: Family Medicine

## 2018-11-05 ENCOUNTER — Ambulatory Visit: Payer: Self-pay

## 2018-11-05 VITALS — BP 138/80 | HR 100 | Ht 63.0 in | Wt 116.5 lb

## 2018-11-05 DIAGNOSIS — R519 Headache, unspecified: Secondary | ICD-10-CM

## 2018-11-05 DIAGNOSIS — G44229 Chronic tension-type headache, not intractable: Secondary | ICD-10-CM | POA: Diagnosis not present

## 2018-11-05 DIAGNOSIS — G8929 Other chronic pain: Secondary | ICD-10-CM

## 2018-11-05 DIAGNOSIS — R51 Headache: Secondary | ICD-10-CM

## 2018-11-05 MED ORDER — TRAMADOL HCL 50 MG PO TABS: 50.0000 mg | ORAL_TABLET | Freq: Two times a day (BID) | ORAL | 0 refills | Status: DC | PRN

## 2018-11-05 NOTE — Telephone Encounter (Signed)
fyi

## 2018-11-05 NOTE — Telephone Encounter (Signed)
Pt. Called to report severe headache and  elevated BP.  Requesting refill on Tramadol.  Stated her headache is from right temple up and across the top of her head.  Stated her eyes hurt, bilaterally.  Denied blurred vision.  C/o dizziness, since onset of headache.  Also c/o nausea. Stated she feels off balance due to the dizziness.  Reported she has had chronic headaches since she had an acoustic tumor removed 10 yrs. Ago.  Stated this is the worst headache she has had in a long time.  Denied facial droop, speech changes, or blurred vision.  Stated her legs, bilaterally, feel tingly off and on.  Stated she also has intermittent jerking of her arms.  Does not report any unilateral weakness of extremities.  BP 174/70 @ 9:30 AM, and 184/80 @ 11:25 AM.  Called Scheduler.; transferred pt. To the office for an appt.  Pt. Agreed with plan.     Reason for Disposition . [1] SEVERE headache (e.g., excruciating) AND [2] not improved after 2 hours of pain medicine  Answer Assessment - Initial Assessment Questions 1. LOCATION: "Where does it hurt?"      On right side near eye and up and across the top of head.   2. ONSET: "When did the headache start?" (Minutes, hours or days)      During the night  3. PATTERN: "Does the pain come and go, or has it been constant since it started?"     Constant  4. SEVERITY: "How bad is the pain?" and "What does it keep you from doing?"  (e.g., Scale 1-10; mild, moderate, or severe)   - MILD (1-3): doesn't interfere with normal activities    - MODERATE (4-7): interferes with normal activities or awakens from sleep    - SEVERE (8-10): excruciating pain, unable to do any normal activities       Severe 10/10 5. RECURRENT SYMPTOM: "Have you ever had headaches before?" If so, ask: "When was the last time?" and "What happened that time?"      Yes; had an acoustic tumor removed and has chronic severe headaches   6. CAUSE: "What do you think is causing the headache?"     Thinks it has  been ongoing since the Acoustic Tumor was removed; about 10 yrs. Ago   7. MIGRAINE: "Have you been diagnosed with migraine headaches?" If so, ask: "Is this headache similar?"      Denied; but has chronic headaches  8. HEAD INJURY: "Has there been any recent injury to the head?"      Denied  9. OTHER SYMPTOMS: "Do you have any other symptoms?" (fever, stiff neck, eye pain, sore throat, cold symptoms)     BP 174/70 @ 9:30 AM, 184/80 @ 11:25; constant dizziness since onset of headache last night, nausea; feels unbalanced; "both legs feel tingly" (this has been intermittent for awhile) ;c/o intermittent arms jerking;  denied facial droop, blurred vision, or speech problems.   10. PREGNANCY: "Is there any chance you are pregnant?" "When was your last menstrual period?"       N/a  Protocols used: HEADACHE-A-AH

## 2018-11-05 NOTE — Progress Notes (Signed)
Established Patient Office Visit  Subjective:  Patient ID: Ana Santos, female    DOB: Apr 12, 1941  Age: 78 y.o. MRN: [id number]  CC:  Chief Complaint  Patient presents with  . Headache    HPI Ana Santos presents for treatment of her ongoing headache described as right sided moving up to the top of her head.  She is also having pain in her bitemporal area.  She says that it is her usual headache status post surgery for acoustic neuroma and cerebral aneurysm some years ago.  She takes tramadol 50 mg twice daily on a ongoing basis but has recently run out of medicine.  She denies sinus issues or changes in her vision.  She describes stress in her life status post intestinal resection back in October of last year and being diagnosed with bladder cancer around that same time.  Past Medical History:  Diagnosis Date  . AN (acoustic neuroma) ([place])   . Aneurysm ([place])   . Anxiety   . Cataract   . Cerebrovascular disease   . Cigarette smoker   . COPD (chronic obstructive pulmonary disease) ([facility])    mild, no inhalers or oxygen used  . Coronary artery disease    no current cardiologist  pt. denies  . Deafness in right ear   . Depression   . Headache    tension headaches due to aneurysm repair with stent  . Heart murmur   . History of kidney stones    15 to 20 yrs ago  . Hyperlipidemia   . Hypothyroidism   . IBS (irritable bowel syndrome)   . Mitral valve prolapse    mild takes beta blocker  . Osteopenia   . Palpitations   . Stroke ([place])    x 3 ministrokes last one feb 2014  . Venous insufficiency   . Vitamin D deficiency     Past Surgical History:  Procedure Laterality Date  . ANEURYSM COILING  11-10   Dr. Estanislado Pandy  . BREAST LUMPECTOMY Right 1986   benign  . DIAGNOSTIC LAPAROSCOPY     Proable laparoscopic right colectomy 03-14-18 Dr. Excell Seltzer  . EYE SURGERY Bilateral    ioc lens for cataracts  . LAPAROSCOPIC RIGHT COLECTOMY Right 03/14/2018   Procedure:  LAPAROSCOPIC ASSISTED  RIGHT COLECTOMY;  Surgeon: Excell Seltzer, MD;  Location: [facility];  Service: General;  Laterality: Right;  . LAPAROSCOPY N/A 03/14/2018   Procedure: LAPAROSCOPY;  Surgeon: Excell Seltzer, MD;  Location: [facility];  Service: General;  Laterality: N/A;  . LEFT HEART CATHETERIZATION WITH CORONARY ANGIOGRAM N/A 08/05/2014   Procedure: LEFT HEART CATHETERIZATION WITH CORONARY ANGIOGRAM;  Surgeon: Wellington Hampshire, MD;  Location: [facility];  Service: Cardiovascular;  Laterality: N/A;  . TONSILLECTOMY    . TOTAL ABDOMINAL HYSTERECTOMY  1970   complete  . TRANSLABYRINTHINE PROCEDURE  2002   [facility] Dr. Vicie Mutters tumor removal  . TRANSURETHRAL RESECTION OF BLADDER TUMOR N/A 02/08/2018   Procedure: TRANSURETHRAL RESECTION OF BLADDER TUMOR (TURBT)/ POSTOPERATIVE INSTILLATION OF CHEMO THERAPY;  Surgeon: Festus Aloe, MD;  Location: [facility];  Service: Urology;  Laterality: N/A;    Family History  Problem Relation Age of Onset  . Heart attack Mother   . Diabetes Mother   . Cancer Father   . Diabetes Sister   . Diabetes Brother   . Cancer Sister        unkown type  . Lung cancer Brother        Lung  .  AAA (abdominal aortic aneurysm) Neg Hx   . Colon cancer Neg Hx   . Stomach cancer Neg Hx   . Esophageal cancer Neg Hx   . Liver cancer Neg Hx   . Pancreatic cancer Neg Hx   . Rectal cancer Neg Hx     Social History   Socioeconomic History  . Marital status: Married    Spouse name: Not on file  . Number of children: Not on file  . Years of education: Not on file  . Highest education level: Not on file  Occupational History  . Occupation: alterations  Social Needs  . Financial resource strain: Not on file  . Food insecurity:    Worry: Not on file    Inability: Not on file  . Transportation needs:    Medical: Not on file    Non-medical: Not on file  Tobacco Use  . Smoking status: Current Every Day Smoker    Packs/day: 0.25    Years: 50.00     Pack years: 12.50    Types: Cigarettes  . Smokeless tobacco: Never Used  . Tobacco comment: 4-5 cigs daily  is aware she needs to quit  Substance and Sexual Activity  . Alcohol use: No    Alcohol/week: 0.0 standard drinks  . Drug use: No  . Sexual activity: Not Currently  Lifestyle  . Physical activity:    Days per week: Not on file    Minutes per session: Not on file  . Stress: Not on file  Relationships  . Social connections:    Talks on phone: Not on file    Gets together: Not on file    Attends religious service: Not on file    Active member of club or organization: Not on file    Attends meetings of clubs or organizations: Not on file    Relationship status: Not on file  . Intimate partner violence:    Fear of current or ex partner: Not on file    Emotionally abused: Not on file    Physically abused: Not on file    Forced sexual activity: Not on file  Other Topics Concern  . Not on file  Social History Narrative   Pt is married, 5 sons (4 living), 14 grandchildren, 4 great grandchildren.   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 acetaminophen (TYLENOL) 500 MG tablet Take 500 mg by mouth every 6 (six) hours as needed for mild pain.     . cholecalciferol (VITAMIN D) 1000 UNITS tablet Take 1,000 Units by mouth daily.      . clopidogrel (PLAVIX) 75 MG tablet Take 1 tablet (75 mg total) by mouth daily. 90 tablet 3  . clorazepate (TRANXENE) 7.5 MG tablet Take 1 tablet (7.5 mg total) by mouth 3 (three) times daily as needed for anxiety. 90 tablet 3  . hydroxypropyl methylcellulose / hypromellose (ISOPTO TEARS / GONIOVISC) 2.5 % ophthalmic solution Place 1 drop into both eyes 2 (two) times daily.     Marland Kitchen levothyroxine (SYNTHROID, LEVOTHROID) 75 MCG tablet TAKE 1 TABLET DAILY BEFORE BREAKFAST (Patient taking differently: Take 75 mcg by mouth daily before breakfast. )  90 tablet 2  . megestrol (MEGACE ORAL) 40 MG/ML suspension Take 61ml by mouth Three times a day before meals 240 mL 5  . metoprolol tartrate (LOPRESSOR) 25 MG tablet TAKE 1 TABLET TWICE A DAY (Patient  taking differently: Take 25 mg by mouth 2 (two) times daily. ) 180 tablet 2  . potassium chloride (K-DUR) 10 MEQ tablet Take 2 tabs daily 60 tablet 0  . pramipexole (MIRAPEX) 0.25 MG tablet Take 1 tablet (0.25 mg total) by mouth at bedtime. TAKE 1 TABLET AT BEDTIME FOR RESTLESS LEG SYMPTOMS 90 tablet 0  . pravastatin (PRAVACHOL) 40 MG tablet Take 1 tablet (40 mg total) by mouth daily. 90 tablet 0  . predniSONE (STERAPRED UNI-PAK 21 TAB) 5 MG (21) TBPK tablet Take as directed 21 tablet 0  . traMADol (ULTRAM) 50 MG tablet Take 1 tablet (50 mg total) by mouth every 12 (twelve) hours as needed for moderate pain. 180 tablet 0   No facility-administered medications prior to visit.     Allergies  Allergen Reactions  . Atorvastatin Other (See Comments)     Lipitor caused arm pain (3/09)  . Hydrocodone-Acetaminophen Other (See Comments)    hallucinations  . Morphine Other (See Comments)    hallucinations  . Nortriptyline     Caused nausea and vomiting and shaking, kept her up all night  . Topamax [Topiramate] Nausea And Vomiting    *dizziness*    ROS Review of Systems  Constitutional: Negative for chills, diaphoresis, fatigue, fever and unexpected weight change.  HENT: Positive for hearing loss. Negative for postnasal drip and rhinorrhea.   Eyes: Negative for photophobia and visual disturbance.  Respiratory: Negative.   Cardiovascular: Negative.   Gastrointestinal: Negative.   Neurological: Positive for headaches. Negative for weakness.      Objective:    Physical Exam  Constitutional: She is oriented to person, place, and time. She appears well-developed and well-nourished. No distress.  HENT:  Head: Normocephalic and atraumatic.  Right Ear: External ear normal.  Left Ear: External  ear normal.  Mouth/Throat: Oropharynx is clear and moist. No oropharyngeal exudate.  Eyes: Pupils are equal, round, and reactive to light. Conjunctivae are normal. Right eye exhibits no discharge. Left eye exhibits no discharge. No scleral icterus.  Neck: Neck supple. No JVD present. No tracheal deviation present. No thyromegaly present.  Cardiovascular: Normal rate, regular rhythm and normal heart sounds.  Pulmonary/Chest: Effort normal. No stridor. She has decreased breath sounds.  Lymphadenopathy:    She has no cervical adenopathy.  Neurological: She is alert and oriented to person, place, and time.  Skin: She is not diaphoretic.  Psychiatric: She has a normal mood and affect. Her behavior is normal.    BP 138/80   Pulse 100   Ht 5\' 3"  (1.6 m)   Wt 116 lb 8 oz (52.8 kg)   SpO2 97%   BMI 20.64 kg/m  Wt Readings from Last 3 Encounters:  11/05/18 116 lb 8 oz (52.8 kg)  06/13/18 113 lb (51.3 kg)  05/22/18 100 lb 6.4 oz (45.5 kg)   BP Readings from Last 3 Encounters:  11/05/18 138/80  06/13/18 120/70  05/22/18 (!) 144/76   Guideline developer:  UpToDate (see UpToDate for funding source) Date Released: June 2014  Health Maintenance Due  Topic Date Due  . Samul Dada  01/04/1960    There are no preventive care reminders to display for this patient.  Lab Results  Component Value Date   TSH 0.44 10/23/2017   Lab Results  Component Value Date   WBC 9.7 05/22/2018   HGB 12.1 05/22/2018   HCT 37.1 05/22/2018   MCV 87.3 05/22/2018   PLT 397.0 05/22/2018   Lab Results  Component Value Date  NA 142 05/22/2018   K 3.3 (L) 05/22/2018   CO2 26 05/22/2018   GLUCOSE 87 05/22/2018   BUN 12 05/22/2018   CREATININE 0.69 05/22/2018   BILITOT 0.3 05/22/2018   ALKPHOS 90 05/22/2018   AST 12 05/22/2018   ALT 5 05/22/2018   PROT 7.4 05/22/2018   ALBUMIN 4.0 05/22/2018   CALCIUM 9.5 05/22/2018   ANIONGAP 6 03/23/2018   GFR 87.60 05/22/2018   Lab Results  Component Value  Date   CHOL 139 04/25/2017   Lab Results  Component Value Date   HDL 48.70 04/25/2017   Lab Results  Component Value Date   LDLCALC 69 04/25/2017   Lab Results  Component Value Date   TRIG 107.0 04/25/2017   Lab Results  Component Value Date   CHOLHDL 3 04/25/2017   No results found for: HGBA1C    Assessment & Plan:   Problem List Items Addressed This Visit      Nervous and Auditory   Chronic tension-type headache, not intractable - Primary   Relevant Medications   traMADol (ULTRAM) 50 MG tablet     Other   Chronic headaches   Relevant Medications   traMADol (ULTRAM) 50 MG tablet      Meds ordered this encounter  Medications  . traMADol (ULTRAM) 50 MG tablet    Sig: Take 1 tablet (50 mg total) by mouth every 12 (twelve) hours as needed for moderate pain.    Dispense:  30 tablet    Refill:  0    Follow-up: Return needs follow up appointment with Dr. Bryan Lemma in next week or so. Marland Kitchen

## 2018-11-22 ENCOUNTER — Other Ambulatory Visit: Payer: Self-pay | Admitting: Family Medicine

## 2018-11-22 ENCOUNTER — Telehealth: Payer: Self-pay | Admitting: Family Medicine

## 2018-11-22 DIAGNOSIS — R519 Headache, unspecified: Secondary | ICD-10-CM

## 2018-11-22 DIAGNOSIS — G8929 Other chronic pain: Secondary | ICD-10-CM

## 2018-11-22 MED ORDER — TRAMADOL HCL 50 MG PO TABS: 50.0000 mg | ORAL_TABLET | Freq: Two times a day (BID) | ORAL | 0 refills | Status: DC | PRN

## 2018-11-22 NOTE — Telephone Encounter (Signed)
Copied from [place] [id number]. Topic: Quick Communication - Rx Refill/Question >> Nov 22, 2018  1:28 PM Nils Flack, Marland Kitchen wrote: Medication: tramadol  Has the patient contacted their pharmacy? No. (Agent: If no, request that the patient contact the pharmacy for the refill.) (Agent: If yes, when and what did the pharmacy advise?)  Preferred Pharm[facility] (with phone number [facility] street [facility]  Please call when this is called in, pt has ran out of meds   Agent: Please be advised that RX refills may take up to 3 business days. We ask that you follow-up with your pharmacy.

## 2018-11-22 NOTE — Progress Notes (Signed)
Rx refilled.

## 2018-11-22 NOTE — Telephone Encounter (Signed)
Dr. Loletha Grayer please advise pt last refill 5/26 and last OV with Dr. Ethelene Hal 5/26. #30 no refills.

## 2018-11-22 NOTE — Telephone Encounter (Signed)
rx refilled. Pt takes 1 tab po BID PRN and typically BID most days. Disp #60. Pt needs appt in 2-3 mo for f/u

## 2018-11-25 NOTE — Telephone Encounter (Signed)
Left VM to call back, need to give instructions per Dr. Loletha Grayer.

## 2018-11-25 NOTE — Telephone Encounter (Signed)
Pt notified about medication  ?

## 2018-12-12 ENCOUNTER — Ambulatory Visit: Payer: Medicare Other | Admitting: Family Medicine

## 2018-12-18 ENCOUNTER — Ambulatory Visit: Payer: Medicare Other | Admitting: Family Medicine

## 2018-12-26 ENCOUNTER — Ambulatory Visit (INDEPENDENT_AMBULATORY_CARE_PROVIDER_SITE_OTHER): Payer: Medicare Other | Admitting: Family Medicine

## 2018-12-26 ENCOUNTER — Encounter: Payer: Self-pay | Admitting: Family Medicine

## 2018-12-26 VITALS — BP 156/72 | HR 61 | Temp 98.1°F | Ht 63.0 in | Wt 119.0 lb

## 2018-12-26 DIAGNOSIS — R519 Headache, unspecified: Secondary | ICD-10-CM

## 2018-12-26 DIAGNOSIS — F411 Generalized anxiety disorder: Secondary | ICD-10-CM | POA: Diagnosis not present

## 2018-12-26 DIAGNOSIS — E538 Deficiency of other specified B group vitamins: Secondary | ICD-10-CM

## 2018-12-26 DIAGNOSIS — R42 Dizziness and giddiness: Secondary | ICD-10-CM | POA: Diagnosis not present

## 2018-12-26 DIAGNOSIS — G2581 Restless legs syndrome: Secondary | ICD-10-CM

## 2018-12-26 DIAGNOSIS — H53123 Transient visual loss, bilateral: Secondary | ICD-10-CM

## 2018-12-26 DIAGNOSIS — E039 Hypothyroidism, unspecified: Secondary | ICD-10-CM

## 2018-12-26 DIAGNOSIS — E559 Vitamin D deficiency, unspecified: Secondary | ICD-10-CM | POA: Diagnosis not present

## 2018-12-26 DIAGNOSIS — D508 Other iron deficiency anemias: Secondary | ICD-10-CM

## 2018-12-26 DIAGNOSIS — E876 Hypokalemia: Secondary | ICD-10-CM

## 2018-12-26 DIAGNOSIS — R51 Headache: Secondary | ICD-10-CM

## 2018-12-26 DIAGNOSIS — J449 Chronic obstructive pulmonary disease, unspecified: Secondary | ICD-10-CM | POA: Diagnosis not present

## 2018-12-26 DIAGNOSIS — G8929 Other chronic pain: Secondary | ICD-10-CM

## 2018-12-26 DIAGNOSIS — E782 Mixed hyperlipidemia: Secondary | ICD-10-CM

## 2018-12-26 LAB — BASIC METABOLIC PANEL
BUN: 24 mg/dL — ABNORMAL HIGH (ref 6–23)
CO2: 23 mEq/L (ref 19–32)
Calcium: 9.2 mg/dL (ref 8.4–10.5)
Chloride: 109 mEq/L (ref 96–112)
Creatinine, Ser: 1.1 mg/dL (ref 0.40–1.20)
GFR: 48.04 mL/min — ABNORMAL LOW (ref >60.00)
Glucose, Bld: 81 mg/dL (ref 70–99)
Potassium: 3.7 mEq/L (ref 3.5–5.1)
Sodium: 139 mEq/L (ref 135–145)

## 2018-12-26 LAB — CBC
HCT: 38.2 % (ref 36.0–46.0)
Hemoglobin: 12.6 g/dL (ref 12.0–15.0)
MCHC: 32.9 g/dL (ref 30.0–36.0)
MCV: 89 fl (ref 78.0–100.0)
Platelets: 257 10*3/uL (ref 150.0–400.0)
RBC: 4.29 Mil/uL (ref 3.87–5.11)
RDW: 13.5 % (ref 11.5–15.5)
WBC: 10.3 10*3/uL (ref 4.0–10.5)

## 2018-12-26 LAB — VITAMIN B12: Vitamin B-12: 376 pg/mL (ref 211–911)

## 2018-12-26 LAB — VITAMIN D 25 HYDROXY (VIT D DEFICIENCY, FRACTURES): VITD: 45.35 ng/mL (ref 30.00–100.00)

## 2018-12-26 LAB — T4, FREE: Free T4: 0.87 ng/dL (ref 0.60–1.60)

## 2018-12-26 LAB — TSH: TSH: 0.28 u[IU]/mL — ABNORMAL LOW (ref 0.35–4.50)

## 2018-12-26 MED ORDER — CLORAZEPATE DIPOTASSIUM 7.5 MG PO TABS: 7.5000 mg | ORAL_TABLET | Freq: Three times a day (TID) | ORAL | 1 refills | Status: DC | PRN

## 2018-12-26 MED ORDER — PRAVASTATIN SODIUM 40 MG PO TABS: 40.0000 mg | ORAL_TABLET | Freq: Every day | ORAL | 3 refills | Status: DC

## 2018-12-26 MED ORDER — PRAMIPEXOLE DIHYDROCHLORIDE 0.25 MG PO TABS: 0.2500 mg | ORAL_TABLET | Freq: Every day | ORAL | 3 refills | Status: DC

## 2018-12-26 MED ORDER — TRAMADOL HCL 50 MG PO TABS: 50.0000 mg | ORAL_TABLET | Freq: Two times a day (BID) | ORAL | 0 refills | Status: DC | PRN

## 2018-12-26 NOTE — Progress Notes (Signed)
Ana Santos is a 78 y.o. female  Chief Complaint  Patient presents with  . Follow-up    f/u/ pt states she has been having spells where she gets dizzy and can't see lasts about 10 min/needs med refills    HPI: Ana Santos is a 78 y.o. female here with her husband for routine f/u on her chronic medical issues including COPD, Vit D deficiency, iron deficiency anemia, Vit B12 deficiency, chronic headaches.   Pt complains of 4-5 episodes in the past month of visual loss (sees "all white") x less than a min with associated lightheadedness and increased pressure in her head. These episodes last about 10 min then slowly resolve with pt sitting and resting. She notes that her BP is elevated when she checks it after these episodes. Last episode was 3 days ago. She states these are similar to when she had stroke in the past.  No issues with falls, balance. No muscle weakness. No issues with speech.  She had this about 2 mo ago and was seen by Dr. Ethelene Hal at that time.  She has a h/o chronic headaches s/p surgery for acoustic neuroma and cerebral aneurysm some years ago. She typically takes tramadol 50mg  BID for this and has done so for years. She takes tylenol and sometimes advil as well and states these help.   Pt request refill of her meds.   She follows with Dr. Teressa Lower (pulm), Dr. Junious Silk (urology), Dr. March Rummage (podiatry), Dr. Scarlette Shorts ([facility]), Dr. Loretta Plume [facility] neuro)  Past Medical History:  Diagnosis Date  . AN (acoustic neuroma) ([place])   . Aneurysm ([place])   . Anxiety   . Cataract   . Cerebrovascular disease   . Cigarette smoker   . COPD (chronic obstructive pulmonary disease) (HCC)    mild, no inhalers or oxygen used  . Coronary artery disease    no current cardiologist  pt. denies  . Deafness in right ear   . Depression   . Headache    tension headaches due to aneurysm repair with stent  . Heart murmur   . History of kidney stones    15 to 20 yrs ago  .  Hyperlipidemia   . Hypothyroidism   . IBS (irritable bowel syndrome)   . Mitral valve prolapse    mild takes beta blocker  . Osteopenia   . Palpitations   . Stroke ([place])    x 3 ministrokes last one feb 2014  . Venous insufficiency   . Vitamin D deficiency     Past Surgical History:  Procedure Laterality Date  . ANEURYSM COILING  11-10   Dr. Estanislado Pandy  . BREAST LUMPECTOMY Right 1986   benign  . DIAGNOSTIC LAPAROSCOPY     Proable laparoscopic right colectomy 03-14-18 Dr. Excell Seltzer  . EYE SURGERY Bilateral    ioc lens for cataracts  . LAPAROSCOPIC RIGHT COLECTOMY Right 03/14/2018   Procedure: LAPAROSCOPIC ASSISTED  RIGHT COLECTOMY;  Surgeon: Excell Seltzer, MD;  [facility];  Service: General;  Laterality: Right;  . LAPAROSCOPY N/A 03/14/2018   Procedure: LAPAROSCOPY;  Surgeon: Excell Seltzer, MD;  [facility];  Service: General;  Laterality: N/A;  . LEFT HEART CATHETERIZATION WITH CORONARY ANGIOGRAM N/A 08/05/2014   Procedure: LEFT HEART CATHETERIZATION WITH CORONARY ANGIOGRAM;  Surgeon: Wellington Hampshire, MD;  Location: [place] C[facility];  Service: Cardiovascular;  Laterality: N/A;  . TONSILLECTOMY    . TOTAL ABDOMINAL HYSTERECTOMY  1970   complete  . TRANSLABYRINTHINE  PROCEDURE  2002   [facility] Dr. Vicie Mutters tumor removal  . TRANSURETHRAL RESECTION OF BLADDER TUMOR N/A 02/08/2018   Procedure: TRANSURETHRAL RESECTION OF BLADDER TUMOR (TURBT)/ POSTOPERATIVE INSTILLATION OF CHEMO THERAPY;  Surgeon: Festus Aloe, MD;  Location: [facility];  Service: Urology;  Laterality: N/A;    Social History   Socioeconomic History  . Marital status: Married    Spouse name: Not on file  . Number of children: Not on file  . Years of education: Not on file  . Highest education level: Not on file  Occupational History  . Occupation: alterations  Social Needs  . Financial resource strain: Not on file  . Food insecurity    Worry: Not on file    Inability: Not on file  .  Transportation needs    Medical: Not on file    Non-medical: Not on file  Tobacco Use  . Smoking status: Current Every Day Smoker    Packs/day: 0.25    Years: 50.00    Pack years: 12.50    Types: Cigarettes  . Smokeless tobacco: Never Used  . Tobacco comment: 4-5 cigs daily  is aware she needs to quit  Substance and Sexual Activity  . Alcohol use: No    Alcohol/week: 0.0 standard drinks  . Drug use: No  . Sexual activity: Not Currently  Lifestyle  . Physical activity    Days per week: Not on file    Minutes per session: Not on file  . Stress: Not on file  Relationships  . Social Herbalist on phone: Not on file    Gets together: Not on file    Attends religious service: Not on file    Active member of club or organization: Not on file    Attends meetings of clubs or organizations: Not on file    Relationship status: Not on file  . Intimate partner violence    Fear of current or ex partner: Not on file    Emotionally abused: Not on file    Physically abused: Not on file    Forced sexual activity: Not on file  Other Topics Concern  . Not on file  Social History Narrative   Pt is married, 5 sons (4 living), 14 grandchildren, 4 great grandchildren.   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 Heart attack Mother   . Diabetes Mother   . Cancer Father   . Diabetes Sister   . Diabetes Brother   . Cancer Sister        unkown type  . Lung cancer Brother        Lung  . AAA (abdominal aortic aneurysm) Neg Hx   . Colon cancer Neg Hx   . Stomach cancer Neg Hx   . Esophageal cancer Neg Hx   . Liver cancer Neg Hx   . Pancreatic cancer Neg Hx   . Rectal cancer Neg Hx      Immunization History  Administered Date(s) Administered  . Influenza Split 06/09/2011, 03/29/2012, 03/12/2018  . Influenza Whole 03/12/2009, 03/29/2010  . Influenza, High Dose Seasonal PF 03/17/2016,  04/25/2017  . Influenza,inj,Quad PF,6+ Mos 05/22/2013, 04/17/2014, 04/22/2015  . Pneumococcal Conjugate-13 10/23/2016  . Pneumococcal Polysaccharide-23 12/18/2007    Outpatient Encounter Medications as of 12/26/2018  Medication Sig  . acetaminophen (TYLENOL) 500 MG tablet Take 500 mg by mouth every 6 (six)  hours as needed for mild pain.   . cholecalciferol (VITAMIN D) 1000 UNITS tablet Take 1,000 Units by mouth daily.    . clopidogrel (PLAVIX) 75 MG tablet Take 1 tablet (75 mg total) by mouth daily.  . clorazepate (TRANXENE) 7.5 MG tablet Take 1 tablet (7.5 mg total) by mouth 3 (three) times daily as needed for anxiety.  . hydroxypropyl methylcellulose / hypromellose (ISOPTO TEARS / GONIOVISC) 2.5 % ophthalmic solution Place 1 drop into both eyes 2 (two) times daily.   Marland Kitchen levothyroxine (SYNTHROID, LEVOTHROID) 75 MCG tablet TAKE 1 TABLET DAILY BEFORE BREAKFAST (Patient taking differently: Take 75 mcg by mouth daily before breakfast. )  . megestrol (MEGACE ORAL) 40 MG/ML suspension Take 17ml by mouth Three times a day before meals  . metoprolol tartrate (LOPRESSOR) 25 MG tablet TAKE 1 TABLET TWICE A DAY (Patient taking differently: Take 25 mg by mouth 2 (two) times daily. )  . potassium chloride (K-DUR) 10 MEQ tablet Take 2 tabs daily  . pramipexole (MIRAPEX) 0.25 MG tablet Take 1 tablet (0.25 mg total) by mouth at bedtime. TAKE 1 TABLET AT BEDTIME FOR RESTLESS LEG SYMPTOMS  . pravastatin (PRAVACHOL) 40 MG tablet Take 1 tablet (40 mg total) by mouth daily.  . predniSONE (STERAPRED UNI-PAK 21 TAB) 5 MG (21) TBPK tablet Take as directed  . traMADol (ULTRAM) 50 MG tablet Take 1 tablet (50 mg total) by mouth every 12 (twelve) hours as needed.  . [DISCONTINUED] clorazepate (TRANXENE) 7.5 MG tablet Take 1 tablet (7.5 mg total) by mouth 3 (three) times daily as needed for anxiety.  . [DISCONTINUED] pramipexole (MIRAPEX) 0.25 MG tablet Take 1 tablet (0.25 mg total) by mouth at bedtime. TAKE 1 TABLET AT  BEDTIME FOR RESTLESS LEG SYMPTOMS  . [DISCONTINUED] pravastatin (PRAVACHOL) 40 MG tablet Take 1 tablet (40 mg total) by mouth daily.  . [DISCONTINUED] traMADol (ULTRAM) 50 MG tablet Take 1 tablet (50 mg total) by mouth every 12 (twelve) hours as needed.   No facility-administered encounter medications on file as of 12/26/2018.      ROS: Pertinent positives and negatives noted in HPI. Remainder of ROS non-contributory   Allergies  Allergen Reactions  . Atorvastatin Other (See Comments)     Lipitor caused arm pain (3/09)  . Hydrocodone-Acetaminophen Other (See Comments)    hallucinations  . Morphine Other (See Comments)    hallucinations  . Nortriptyline     Caused nausea and vomiting and shaking, kept her up all night  . Topamax [Topiramate] Nausea And Vomiting    *dizziness*    BP (!) 156/72   Pulse 61   Temp 98.1 F (36.7 C) (Oral)   Ht 5\' 3"  (1.6 m)   Wt 119 lb (54 kg)   SpO2 99%   BMI 21.08 kg/m   BP Readings from Last 3 Encounters:  12/26/18 (!) 156/72  11/05/18 138/80  06/13/18 120/70     Physical Exam  Constitutional: She is oriented to person, place, and time. She appears well-developed and well-nourished. No distress.  HENT:  Head: Normocephalic and atraumatic.  Eyes: Pupils are equal, round, and reactive to light. Conjunctivae and EOM are normal. Right eye exhibits normal extraocular motion and no nystagmus. Left eye exhibits normal extraocular motion and no nystagmus.  Neck: Neck supple. Carotid bruit is not present. No thyromegaly present.  Cardiovascular: Normal rate and regular rhythm.  Pulmonary/Chest: Effort normal and breath sounds normal.  Lymphadenopathy:    She has no cervical adenopathy.  Neurological: She is  alert and oriented to person, place, and time. She has normal reflexes. No cranial nerve deficit or sensory deficit. She exhibits normal muscle tone. Coordination normal.  Reflex Scores:      Tricep reflexes are 2+ on the right side and 2+  on the left side.      Bicep reflexes are 2+ on the right side and 2+ on the left side.      Brachioradialis reflexes are 2+ on the right side and 2+ on the left side.      Patellar reflexes are 2+ on the right side and 2+ on the left side.      Achilles reflexes are 2+ on the right side and 2+ on the left side. Psychiatric: She has a normal mood and affect. Her behavior is normal.     A/P:  1. Other iron deficiency anemia - Iron, TIBC and Ferritin Panel - CBC  2. Chronic nonintractable headache, unspecified headache type 3. Nonintractable episodic headache, unspecified headache type 4. Visual loss, transient, bilateral 5. Episodic lightheadedness - pt has has chronic headaches and cephalgia x years, but in the past month has had 4-5 episodes of brief visual loss accompanied but lightheadedness and more intense headache. She has h/o cerebral aneurysm s/p coil as well as acoustic neuroma and CVA. Her recent symptoms are concerning especially in light of her history. Pt is on plavix daily, pravastatin daily. She saw neuro in the pat but has not been seen since 2017. Will get B/L carotid US and MRI brain for eval and pt will schedule f/u with neuro. Pt understands the need to seek urgent care if these symptoms increase in frequency, intensity, or new symptoms develop - MR Brain Wo Contrast; Future - US Carotid Duplex Bilateral; Future Refill: - traMADol (ULTRAM) 50 MG tablet; Take 1 tablet (50 mg total) by mouth every 12 (twelve) hours as needed.  Dispense: 60 tablet; Refill: 0  6. COPD (chronic obstructive pulmonary disease) with chronic bronchitis (HCC) - stable, follows with pulm  7. Hypothyroidism, unspecified type - currently on 34mcg daily of levothyroxine - TSH - T4, free  8. Mixed hyperlipidemia Refill: - pravastatin (PRAVACHOL) 40 MG tablet; Take 1 tablet (40 mg total) by mouth daily.  Dispense: 90 tablet; Refill: 3 - pt is not fasting today so will defer FLP  9. Vitamin D  deficiency - pt taking Vit D 1000IU daily - VITAMIN D 25 Hydroxy (Vit-D Deficiency, Fractures)  10. Vitamin B12 deficiency - pt taking oral Vit B12 1000mg  daily - Vitamin B12  11. Anxiety state - chronic, stable Refill: - clorazepate (TRANXENE) 7.5 MG tablet; Take 1 tablet (7.5 mg total) by mouth 3 (three) times daily as needed for anxiety.  Dispense: 90 tablet; Refill: 1  12. Restless legs syndrome (RLS) - stable, controlled on med Refill: - pramipexole (MIRAPEX) 0.25 MG tablet; Take 1 tablet (0.25 mg total) by mouth at bedtime. TAKE 1 TABLET AT BEDTIME FOR RESTLESS LEG SYMPTOMS  Dispense: 90 tablet; Refill: 3  13. Hypokalemia - pt takes KCl 56meq daily - Basic metabolic panel

## 2018-12-27 ENCOUNTER — Encounter: Payer: Self-pay | Admitting: Family Medicine

## 2018-12-27 LAB — IRON,TIBC AND FERRITIN PANEL
%SAT: 17 % (calc) (ref 16–45)
Ferritin: 83 ng/mL (ref 16–288)
Iron: 48 ug/dL (ref 45–160)
TIBC: 285 mcg/dL (calc) (ref 250–450)

## 2019-01-01 ENCOUNTER — Other Ambulatory Visit: Payer: Self-pay | Admitting: Family Medicine

## 2019-01-01 DIAGNOSIS — E039 Hypothyroidism, unspecified: Secondary | ICD-10-CM

## 2019-01-29 ENCOUNTER — Other Ambulatory Visit: Payer: Self-pay

## 2019-01-29 ENCOUNTER — Other Ambulatory Visit: Payer: Self-pay | Admitting: Family Medicine

## 2019-01-29 ENCOUNTER — Other Ambulatory Visit (INDEPENDENT_AMBULATORY_CARE_PROVIDER_SITE_OTHER): Payer: Medicare Other

## 2019-01-29 DIAGNOSIS — E039 Hypothyroidism, unspecified: Secondary | ICD-10-CM

## 2019-01-29 DIAGNOSIS — G8929 Other chronic pain: Secondary | ICD-10-CM

## 2019-01-29 DIAGNOSIS — R519 Headache, unspecified: Secondary | ICD-10-CM

## 2019-01-29 LAB — TSH: TSH: 0.57 u[IU]/mL (ref 0.35–4.50)

## 2019-01-29 LAB — T4, FREE: Free T4: 1 ng/dL (ref 0.60–1.60)

## 2019-01-29 MED ORDER — TRAMADOL HCL 50 MG PO TABS: 50.0000 mg | ORAL_TABLET | Freq: Two times a day (BID) | ORAL | 0 refills | Status: DC | PRN

## 2019-01-30 ENCOUNTER — Ambulatory Visit
Admission: RE | Admit: 2019-01-30 | Discharge: 2019-01-30 | Disposition: A | Discharge status: Home / Self Care | Payer: Medicare Other | Source: Ambulatory Visit | Attending: Family Medicine | Admitting: Family Medicine

## 2019-01-30 DIAGNOSIS — H532 Diplopia: Secondary | ICD-10-CM | POA: Diagnosis not present

## 2019-01-30 DIAGNOSIS — R51 Headache: Secondary | ICD-10-CM | POA: Diagnosis not present

## 2019-01-30 DIAGNOSIS — R519 Headache, unspecified: Secondary | ICD-10-CM

## 2019-02-03 ENCOUNTER — Telehealth: Payer: Self-pay

## 2019-02-03 ENCOUNTER — Telehealth: Payer: Self-pay | Admitting: Family Medicine

## 2019-02-03 DIAGNOSIS — R519 Headache, unspecified: Secondary | ICD-10-CM

## 2019-02-03 DIAGNOSIS — G8929 Other chronic pain: Secondary | ICD-10-CM

## 2019-02-03 MED ORDER — TRAMADOL HCL 50 MG PO TABS: 50.0000 mg | ORAL_TABLET | Freq: Two times a day (BID) | ORAL | 0 refills | Status: AC | PRN

## 2019-02-03 NOTE — Telephone Encounter (Signed)
[facility] was sent in on August 19 I spoke with pt to make her aware when this medication was sent int.[facility] said that the medication was sent on  August 21 and that pt should receive this medication no later then Wednesday Aug 26th but pt may receive this medication before. Pt is upset and is asking for [facility] and also wants a partial script sent to local pharmacy. Dr. Loletha Grayer is out of the office. Dr. Ethelene Hal can you help me with this?

## 2019-02-03 NOTE — Telephone Encounter (Signed)
traMADol (ULTRAM) 50 MG tablet     Patient calling upset that her medication has not been received. Advised patient that the medication was sent to [facility]. Patient then became more upset saying she has been in bed, in pain for three days, and needs a partial script sent into local pharmacy on file. Patient then stated that she needs to see an "actual medical doctor" and used expletives and threatened to call the "head honcho" to report office. Patient just kept repeating that she needs an "actual doctor" and not a DO. Advised patient that a DO is an "actual doctor".  Patient then requested to transfer care to another physician in office.   Pharmacy:  [facility] [address], [place] - [place] [phone] (Phone) [phone] (Fax)

## 2019-02-03 NOTE — Telephone Encounter (Signed)
Pt is aware.  

## 2019-02-03 NOTE — Telephone Encounter (Signed)
Dr. Loletha Grayer, Dr. Ethelene Hal are you okay with this [facility]  Copied from [place] [id number]. Topic: Appointment Scheduling - Transfer of Care >> Feb 03, 2019 10:40 AM Sheran Luz wrote: Pt is requesting to transfer FROM: Dr. Bryan Lemma  Pt is requesting to transfer TO: Dr. Ethelene Hal  Reason for requested transfer: Patient feels as if Dr. Ethelene Hal is a better fit for her needs.

## 2019-02-05 NOTE — Telephone Encounter (Signed)
Fine with me

## 2019-02-19 ENCOUNTER — Telehealth: Payer: Self-pay

## 2019-02-19 MED ORDER — LEVOTHYROXINE SODIUM 75 MCG PO TABS: 75.0000 ug | ORAL_TABLET | Freq: Every day | ORAL | 2 refills | Status: DC

## 2019-02-19 NOTE — Telephone Encounter (Signed)
Rx sent in.   Copied from [place] [id number]. Topic: Quick Communication - Rx Refill/Question >> Feb 19, 2019 12:59 PM [name] wrote: Medication levothyroxine (SYNTHROID, LEVOTHROID) 75 MCG tablet   Preferred Pharmacy   [facility]   Agent: Please be advised that RX refills may take up to 3 business days. We ask that you follow-up with your pharmacy.

## 2019-02-20 DIAGNOSIS — H18413 Arcus senilis, bilateral: Secondary | ICD-10-CM | POA: Diagnosis not present

## 2019-02-20 DIAGNOSIS — H02831 Dermatochalasis of right upper eyelid: Secondary | ICD-10-CM | POA: Diagnosis not present

## 2019-02-20 DIAGNOSIS — H52223 Regular astigmatism, bilateral: Secondary | ICD-10-CM | POA: Diagnosis not present

## 2019-02-20 DIAGNOSIS — H11823 Conjunctivochalasis, bilateral: Secondary | ICD-10-CM | POA: Diagnosis not present

## 2019-02-20 DIAGNOSIS — H02834 Dermatochalasis of left upper eyelid: Secondary | ICD-10-CM | POA: Diagnosis not present

## 2019-02-20 DIAGNOSIS — H11153 Pinguecula, bilateral: Secondary | ICD-10-CM | POA: Diagnosis not present

## 2019-02-20 DIAGNOSIS — H35033 Hypertensive retinopathy, bilateral: Secondary | ICD-10-CM | POA: Diagnosis not present

## 2019-02-20 DIAGNOSIS — H353131 Nonexudative age-related macular degeneration, bilateral, early dry stage: Secondary | ICD-10-CM | POA: Diagnosis not present

## 2019-02-20 DIAGNOSIS — H16223 Keratoconjunctivitis sicca, not specified as Sjogren's, bilateral: Secondary | ICD-10-CM | POA: Diagnosis not present

## 2019-02-20 DIAGNOSIS — H524 Presbyopia: Secondary | ICD-10-CM | POA: Diagnosis not present

## 2019-02-21 ENCOUNTER — Telehealth: Payer: Self-pay

## 2019-02-21 ENCOUNTER — Encounter: Payer: Self-pay | Admitting: Family Medicine

## 2019-02-21 ENCOUNTER — Ambulatory Visit (INDEPENDENT_AMBULATORY_CARE_PROVIDER_SITE_OTHER): Payer: Medicare Other | Admitting: Family Medicine

## 2019-02-21 ENCOUNTER — Ambulatory Visit: Payer: Self-pay

## 2019-02-21 ENCOUNTER — Other Ambulatory Visit: Payer: Self-pay

## 2019-02-21 VITALS — BP 172/68 | HR 57 | Temp 98.6°F | Wt 120.2 lb

## 2019-02-21 DIAGNOSIS — H53123 Transient visual loss, bilateral: Secondary | ICD-10-CM | POA: Diagnosis not present

## 2019-02-21 DIAGNOSIS — I10 Essential (primary) hypertension: Secondary | ICD-10-CM

## 2019-02-21 DIAGNOSIS — R0989 Other specified symptoms and signs involving the circulatory and respiratory systems: Secondary | ICD-10-CM

## 2019-02-21 MED ORDER — AMLODIPINE BESYLATE 5 MG PO TABS: 5.0000 mg | ORAL_TABLET | Freq: Every day | ORAL | 0 refills | Status: DC

## 2019-02-21 NOTE — Assessment & Plan Note (Signed)
-  Amlodipine added.  -Recommend low salt diet and d/c smoking

## 2019-02-21 NOTE — Telephone Encounter (Signed)
Hi guys,  Dr. Zigmund Daniel would like to get pt set up for a carotid US. He has placed the order, can we get this scheduled?  Thanks!

## 2019-02-21 NOTE — Assessment & Plan Note (Signed)
-  She has had these symptoms for several weeks, doubt new acute process as symptoms are unchanged.  -Her BP is uncontrolled and I have added amlodipine to better manage her hypertension.  -Carotid US ordered previously however it appears it was never scheduled.  Will re-order.  -She will continue plavix, asa and statin for now.  -Discussed that if she develops new or worsening symptoms of tia/stroke she should contact 911.

## 2019-02-21 NOTE — Progress Notes (Signed)
Ana Santos - 78 y.o. female MRN [id number]  Date of birth: 07/12/1940  Subjective Chief Complaint  Patient presents with  . Diplopia    having headaches constantly and she thinks that she is worried about another stroke. They always start like this.    HPI Ana Santos is a 78 y.o. female wit history of aneurysm s/p coiling, COPD, chronic headaches and TIA here today for follow up of vision changes and headaches.  She has had ongoing problems with diplopia and blurred vision as well as chronic recurrent headaches for several months.  She did have MRI Brain on 01/30/2019 that showed stable aneurysm and remote lacunar infarct of the R cerebellum. She was also seen at [facility] today and was told that her eye problems are likely coming from her elevated blood pressure.  Current pressures are 172/68 which is consistent with readings at ophthalmology.  She denies weakness, numbness, tingling, or slurred speech.  She is currently taking metoprolol 25mg  bid for blood pressure.  She is compliant with pravastatin and clopidogrel.    ROS:  A comprehensive ROS was completed and negative except as noted per HPI  Allergies  Allergen Reactions  . Atorvastatin Other (See Comments)     Lipitor caused arm pain (3/09)  . Hydrocodone-Acetaminophen Other (See Comments)    hallucinations  . Morphine Other (See Comments)    hallucinations  . Nortriptyline     Caused nausea and vomiting and shaking, kept her up all night  . Topamax [Topiramate] Nausea And Vomiting    *dizziness*    Past Medical History:  Diagnosis Date  . AN (acoustic neuroma) ([place])   . Aneurysm ([place])   . Anxiety   . Cataract   . Cerebrovascular disease   . Cigarette smoker   . COPD (chronic obstructive pulmonary disease) (HCC)    mild, no inhalers or oxygen used  . Coronary artery disease    no current cardiologist  pt. denies  . Deafness in right ear   . Depression   . Headache    tension headaches due to  aneurysm repair with stent  . Heart murmur   . History of kidney stones    15 to 20 yrs ago  . Hyperlipidemia   . Hypothyroidism   . IBS (irritable bowel syndrome)   . Mitral valve prolapse    mild takes beta blocker  . Osteopenia   . Palpitations   . Stroke ([place])    x 3 ministrokes last one feb 2014  . Venous insufficiency   . Vitamin D deficiency     Past Surgical History:  Procedure Laterality Date  . ANEURYSM COILING  11-10   Dr. Estanislado Pandy  . BREAST LUMPECTOMY Right 1986   benign  . DIAGNOSTIC LAPAROSCOPY     Proable laparoscopic right colectomy 03-14-18 Dr. Excell Seltzer  . EYE SURGERY Bilateral    ioc lens for cataracts  . LAPAROSCOPIC RIGHT COLECTOMY Right 03/14/2018   Procedure: LAPAROSCOPIC ASSISTED  RIGHT COLECTOMY;  Surgeon: Excell Seltzer, MD;  [facility];  Service: General;  Laterality: Right;  . LAPAROSCOPY N/A 03/14/2018   Procedure: LAPAROSCOPY;  Surgeon: Excell Seltzer, MD;  [facility];  Service: General;  Laterality: N/A;  . LEFT HEART CATHETERIZATION WITH CORONARY ANGIOGRAM N/A 08/05/2014   Procedure: LEFT HEART CATHETERIZATION WITH CORONARY ANGIOGRAM;  Surgeon: Wellington Hampshire, MD;  Location: [place] CATH LAB;  Service: Cardiovascular;  Laterality: N/A;  . TONSILLECTOMY    . TOTAL ABDOMINAL HYSTERECTOMY  1970   complete  . TRANSLABYRINTHINE PROCEDURE  2002   [facility] Dr. Vicie Mutters tumor removal  . TRANSURETHRAL RESECTION OF BLADDER TUMOR N/A 02/08/2018   Procedure: TRANSURETHRAL RESECTION OF BLADDER TUMOR (TURBT)/ POSTOPERATIVE INSTILLATION OF CHEMO THERAPY;  Surgeon: Festus Aloe, MD;  Location: [facility];  Service: Urology;  Laterality: N/A;    Social History   Socioeconomic History  . Marital status: Married    Spouse name: Not on file  . Number of children: Not on file  . Years of education: Not on file  . Highest education level: Not on file  Occupational History  . Occupation: alterations  Social Needs  . Financial  resource strain: Not on file  . Food insecurity    Worry: Not on file    Inability: Not on file  . Transportation needs    Medical: Not on file    Non-medical: Not on file  Tobacco Use  . Smoking status: Current Every Day Smoker    Packs/day: 0.25    Years: 50.00    Pack years: 12.50    Types: Cigarettes  . Smokeless tobacco: Never Used  . Tobacco comment: 4-5 cigs daily  is aware she needs to quit  Substance and Sexual Activity  . Alcohol use: No    Alcohol/week: 0.0 standard drinks  . Drug use: No  . Sexual activity: Not Currently  Lifestyle  . Physical activity    Days per week: Not on file    Minutes per session: Not on file  . Stress: Not on file  Relationships  . Social Herbalist on phone: Not on file    Gets together: Not on file    Attends religious service: Not on file    Active member of club or organization: Not on file    Attends meetings of clubs or organizations: Not on file    Relationship status: Not on file  Other Topics Concern  . Not on file  Social History Narrative   Pt is married, 5 sons (4 living), 14 grandchildren, 4 great grandchildren.   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 Heart attack Mother   . Diabetes Mother   . Cancer Father   . Diabetes Sister   . Diabetes Brother   . Cancer Sister        unkown type  . Lung cancer Brother        Lung  . AAA (abdominal aortic aneurysm) Neg Hx   . Colon cancer Neg Hx   . Stomach cancer Neg Hx   . Esophageal cancer Neg Hx   . Liver cancer Neg Hx   . Pancreatic cancer Neg Hx   . Rectal cancer Neg Hx     Health Maintenance  Topic Date Due  . TETANUS/TDAP  01/04/1960  . INFLUENZA VACCINE  01/11/2019  . DEXA SCAN  Completed  . PNA vac Low Risk Adult  Completed     ----------------------------------------------------------------------------------------------------------------------------------------------------------------------------------------------------------------- Physical Exam BP (!) 172/68 (BP Location: Left Arm, Cuff Size: Normal)   Pulse (!) 57   Temp 98.6 F (37 C) (Oral)   Wt 120 lb 3.2 oz (54.5 kg)   SpO2 97%   BMI 21.29 kg/m   Physical Exam Constitutional:      Appearance: Normal appearance.  HENT:     Head: Normocephalic and atraumatic.     Mouth/Throat:  Mouth: Mucous membranes are moist.  Eyes:     General: No scleral icterus.    Extraocular Movements: Extraocular movements intact.     Pupils: Pupils are equal, round, and reactive to light.  Neck:     Musculoskeletal: Neck supple.     Vascular: Carotid bruit (L) present.  Cardiovascular:     Rate and Rhythm: Normal rate and regular rhythm.     Heart sounds: Murmur present.  Pulmonary:     Effort: Pulmonary effort is normal.     Breath sounds: Normal breath sounds.  Skin:    General: Skin is warm and dry.     Findings: No rash.  Neurological:     General: No focal deficit present.     Mental Status: She is alert and oriented to person, place, and time.     Cranial Nerves: No cranial nerve deficit.     Sensory: No sensory deficit.     Motor: No weakness.     Coordination: Coordination normal.     Gait: Gait normal.  Psychiatric:        Mood and Affect: Mood normal.        Behavior: Behavior normal.     ------------------------------------------------------------------------------------------------------------------------------------------------------------------------------------------------------------------- Assessment and Plan  Visual loss, transient, bilateral -She has had these symptoms for several weeks, doubt new acute process as symptoms are unchanged.  -Her BP is uncontrolled and I have added amlodipine to better manage her hypertension.   -Carotid US ordered previously however it appears it was never scheduled.  Will re-order.  -She will continue plavix, asa and statin for now.  -Discussed that if she develops new or worsening symptoms of tia/stroke she should contact 911.   Essential hypertension -Amlodipine added.  -Recommend low salt diet and d/c smoking   Return in about 5 days (around 02/26/2019) for BP.

## 2019-02-21 NOTE — Telephone Encounter (Signed)
FYI

## 2019-02-21 NOTE — Telephone Encounter (Signed)
Pt. Reports she has had double vision and Dr. Bryan Lemma sent her to the eye doctor for. Saw eye doctor yesterday and BP was 176/85. She was told her double vision is due to her BP. BP this morning is 167/75. Warm transfer to [facility] in the practice for a visit.  Answer Assessment - Initial Assessment Questions 1. BLOOD PRESSURE: "What is the blood pressure?" "Did you take at least two measurements 5 minutes apart?"     167/75 2. ONSET: "When did you take your blood pressure?"     This morning 3. HOW: "How did you obtain the blood pressure?" (e.g., visiting nurse, automatic home BP monitor)     Home monitor 4. HISTORY: "Do you have a history of high blood pressure?"     Yes 5. MEDICATIONS: "Are you taking any medications for blood pressure?" "Have you missed any doses recently?"     No missed doses 6. OTHER SYMPTOMS: "Do you have any symptoms?" (e.g., headache, chest pain, blurred vision, difficulty breathing, weakness)     Double vision 7. PREGNANCY: "Is there any chance you are pregnant?" "When was your last menstrual period?"     No  Protocols used: HIGH BLOOD PRESSURE-A-AH

## 2019-02-21 NOTE — Patient Instructions (Signed)
Start amlodipine for blood pressure I have ordered an ultrasound of the arteries in your neck.  Please follow up next week for your blood pressure.   If you have worsening symptoms including chest pain, headache, weakness, numbness, tingling, slurred speech or worsening vision changes call 911!!

## 2019-02-27 ENCOUNTER — Ambulatory Visit: Payer: Medicare Other | Admitting: Family Medicine

## 2019-02-28 ENCOUNTER — Ambulatory Visit
Admission: RE | Admit: 2019-02-28 | Discharge: 2019-02-28 | Disposition: A | Discharge status: Home / Self Care | Payer: Medicare Other | Source: Ambulatory Visit | Attending: Family Medicine | Admitting: Family Medicine

## 2019-02-28 DIAGNOSIS — H53123 Transient visual loss, bilateral: Secondary | ICD-10-CM

## 2019-02-28 DIAGNOSIS — I6523 Occlusion and stenosis of bilateral carotid arteries: Secondary | ICD-10-CM | POA: Diagnosis not present

## 2019-02-28 DIAGNOSIS — R0989 Other specified symptoms and signs involving the circulatory and respiratory systems: Secondary | ICD-10-CM

## 2019-03-03 NOTE — Progress Notes (Signed)
Please let her know that her US shows mild-moderate narrowing of her carotid artery on the R side.  The L side looks ok.  I don't think her symptoms would be coming from this based on these findings.  Has she improved with new BP medication?  If not I would like to have her follow up with neurology regarding her symptoms.

## 2019-03-04 ENCOUNTER — Ambulatory Visit (INDEPENDENT_AMBULATORY_CARE_PROVIDER_SITE_OTHER): Payer: Medicare Other | Admitting: Family Medicine

## 2019-03-04 ENCOUNTER — Other Ambulatory Visit: Payer: Self-pay

## 2019-03-04 ENCOUNTER — Encounter: Payer: Self-pay | Admitting: Family Medicine

## 2019-03-04 VITALS — BP 146/68 | HR 58 | Temp 98.0°F | Ht 63.0 in | Wt 121.6 lb

## 2019-03-04 DIAGNOSIS — I10 Essential (primary) hypertension: Secondary | ICD-10-CM | POA: Diagnosis not present

## 2019-03-04 DIAGNOSIS — F17200 Nicotine dependence, unspecified, uncomplicated: Secondary | ICD-10-CM

## 2019-03-04 DIAGNOSIS — Z23 Encounter for immunization: Secondary | ICD-10-CM

## 2019-03-04 DIAGNOSIS — H53123 Transient visual loss, bilateral: Secondary | ICD-10-CM | POA: Diagnosis not present

## 2019-03-04 MED ORDER — TRAMADOL HCL 50 MG PO TABS: 50.0000 mg | ORAL_TABLET | Freq: Two times a day (BID) | ORAL | 2 refills | Status: AC | PRN

## 2019-03-04 NOTE — Assessment & Plan Note (Signed)
-  This has resolved now that BP is better controlled.  I discussed with her that if symptoms return she should let me know.

## 2019-03-04 NOTE — Assessment & Plan Note (Signed)
-  BP is better controlled today.  She will continue current medications at current dose with follow up in 4 months.

## 2019-03-04 NOTE — Patient Instructions (Signed)

## 2019-03-04 NOTE — Assessment & Plan Note (Signed)
-  Counseled on smoking cessation and how this increases her risk of stroke and heart disease.  She is not ready to quit yet?

## 2019-03-04 NOTE — Progress Notes (Signed)
Ana Santos - 78 y.o. female MRN [id number]  Date of birth: [birth year]  Subjective Chief Complaint  Patient presents with  . Establish Care    est care/ f/u BP and US/ wants flu shot     HPI Ana Santos is a 78 y.o. female with history of cerebral aneurysm s/p coiling, COPD with continued nicotine dependence, HTN, chronic headaches and anxiety here today for follow up of HTN and vision changes.  At her last appointment amlodipine was added to her medications for better control of her blood pressure.  She has also had carotid ultrasound which only shows mild-moderate stenosis of the R carotid artery.  She brings in a record of her blood pressures and readings are much better controlled.  She reports she has not had any further problems with her vision and her headaches have improved some.  She does unfortunately continue to smoke. She is not ready to quit yet.    ROS:  A comprehensive ROS was completed and negative except as noted per HPI  Allergies  Allergen Reactions  . Atorvastatin Other (See Comments)     Lipitor caused arm pain (3/09)  . Hydrocodone-Acetaminophen Other (See Comments)    hallucinations  . Morphine Other (See Comments)    hallucinations  . Nortriptyline     Caused nausea and vomiting and shaking, kept her up all night  . Topamax [Topiramate] Nausea And Vomiting    *dizziness*    Past Medical History:  Diagnosis Date  . AN (acoustic neuroma) ([place])   . Aneurysm ([place])   . Anxiety   . Cataract   . Cerebrovascular disease   . Cigarette smoker   . COPD (chronic obstructive pulmonary disease) ([facility])    mild, no inhalers or oxygen used  . Coronary artery disease    no current cardiologist  pt. denies  . Deafness in right ear   . Depression   . Headache    tension headaches due to aneurysm repair with stent  . Heart murmur   . History of kidney stones    15 to 20 yrs ago  . Hyperlipidemia   . Hypothyroidism   . IBS (irritable bowel syndrome)   .  Mitral valve prolapse    mild takes beta blocker  . Osteopenia   . Palpitations   . Stroke ([place])    x 3 ministrokes last one feb 2014  . Venous insufficiency   . Vitamin D deficiency     Past Surgical History:  Procedure Laterality Date  . ANEURYSM COILING  11-10   Dr. Estanislado Pandy  . BREAST LUMPECTOMY Right 1986   benign  . DIAGNOSTIC LAPAROSCOPY     Proable laparoscopic right colectomy 03-14-18 Dr. Excell Seltzer  . EYE SURGERY Bilateral    ioc lens for cataracts  . LAPAROSCOPIC RIGHT COLECTOMY Right 03/14/2018   Procedure: LAPAROSCOPIC ASSISTED  RIGHT COLECTOMY;  Surgeon: Excell Seltzer, MD;  Location: [facility];  Service: General;  Laterality: Right;  . LAPAROSCOPY N/A 03/14/2018   Procedure: LAPAROSCOPY;  Surgeon: Excell Seltzer, MD;  Location: [facility];  Service: General;  Laterality: N/A;  . LEFT HEART CATHETERIZATION WITH CORONARY ANGIOGRAM N/A 08/05/2014   Procedure: LEFT HEART CATHETERIZATION WITH CORONARY ANGIOGRAM;  Surgeon: Wellington Hampshire, MD;  Location: [facility];  Service: Cardiovascular;  Laterality: N/A;  . TONSILLECTOMY    . TOTAL ABDOMINAL HYSTERECTOMY  1970   complete  . TRANSLABYRINTHINE PROCEDURE  2002   [facility] Dr. Vicie Mutters tumor removal  . TRANSURETHRAL  RESECTION OF BLADDER TUMOR N/A 02/08/2018   Procedure: TRANSURETHRAL RESECTION OF BLADDER TUMOR (TURBT)/ POSTOPERATIVE INSTILLATION OF CHEMO THERAPY;  Surgeon: Festus Aloe, MD;  Location[facility];  Service: Urology;  Laterality: N/A;    Social History   Socioeconomic History  . Marital status: Married    Spouse name: Not on file  . Number of children: Not on file  . Years of education: Not on file  . Highest education level: Not on file  Occupational History  . Occupation: alterations  Social Needs  . Financial resource strain: Not on file  . Food insecurity    Worry: Not on file    Inability: Not on file  . Transportation needs    Medical: Not on file    Non-medical: Not on file   Tobacco Use  . Smoking status: Current Every Day Smoker    Packs/day: 0.25    Years: 50.00    Pack years: 12.50    Types: Cigarettes  . Smokeless tobacco: Never Used  . Tobacco comment: 4-5 cigs daily  is aware she needs to quit  Substance and Sexual Activity  . Alcohol use: No    Alcohol/week: 0.0 standard drinks  . Drug use: No  . Sexual activity: Not Currently  Lifestyle  . Physical activity    Days per week: Not on file    Minutes per session: Not on file  . Stress: Not on file  Relationships  . Social Herbalist on phone: Not on file    Gets together: Not on file    Attends religious service: Not on file    Active member of club or organization: Not on file    Attends meetings of clubs or organizations: Not on file    Relationship status: Not on file  Other Topics Concern  . Not on file  Social History Narrative   Pt is married, 5 sons (4 living), 14 grandchildren, 4 great grandchildren.   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 Heart attack Mother   . Diabetes Mother   . Cancer Father   . Diabetes Sister   . Diabetes Brother   . Cancer Sister        unkown type  . Lung cancer Brother        Lung  . AAA (abdominal aortic aneurysm) Neg Hx   . Colon cancer Neg Hx   . Stomach cancer Neg Hx   . Esophageal cancer Neg Hx   . Liver cancer Neg Hx   . Pancreatic cancer Neg Hx   . Rectal cancer Neg Hx     Health Maintenance  Topic Date Due  . TETANUS/TDAP  01/04/1960  . INFLUENZA VACCINE  01/11/2019  . DEXA SCAN  Completed  . PNA vac Low Risk Adult  Completed    ----------------------------------------------------------------------------------------------------------------------------------------------------------------------------------------------------------------- Physical Exam BP (!) 146/68   Pulse (!) 58   Temp 98 F (36.7 C) (Oral)   Ht 5\' 3"  (1.6 m)    Wt 121 lb 9.6 oz (55.2 kg)   SpO2 98%   BMI 21.54 kg/m   Physical Exam Constitutional:      Appearance: Normal appearance.  HENT:     Head: Normocephalic and atraumatic.     Nose: Nose normal.     Mouth/Throat:     Mouth: Mucous membranes are moist.  Cardiovascular:  Rate and Rhythm: Normal rate and regular rhythm.     Heart sounds: Murmur present.  Pulmonary:     Effort: Pulmonary effort is normal.     Breath sounds: Normal breath sounds.  Skin:    General: Skin is warm and dry.  Neurological:     General: No focal deficit present.     Mental Status: She is alert.  Psychiatric:        Mood and Affect: Mood normal.        Behavior: Behavior normal.     ------------------------------------------------------------------------------------------------------------------------------------------------------------------------------------------------------------------- Assessment and Plan  CIGARETTE SMOKER -Counseled on smoking cessation and how this increases her risk of stroke and heart disease.  She is not ready to quit yet?  Visual loss, transient, bilateral -This has resolved now that BP is better controlled.  I discussed with her that if symptoms return she should let me know.   Essential hypertension -BP is better controlled today.  She will continue current medications at current dose with follow up in 4 months.

## 2019-04-08 ENCOUNTER — Telehealth: Payer: Self-pay | Admitting: Family Medicine

## 2019-04-08 ENCOUNTER — Other Ambulatory Visit: Payer: Self-pay | Admitting: Family Medicine

## 2019-04-08 DIAGNOSIS — R519 Headache, unspecified: Secondary | ICD-10-CM

## 2019-04-08 DIAGNOSIS — G8929 Other chronic pain: Secondary | ICD-10-CM

## 2019-04-08 MED ORDER — TRAMADOL HCL 50 MG PO TABS: 50.0000 mg | ORAL_TABLET | Freq: Two times a day (BID) | ORAL | 2 refills | Status: DC | PRN

## 2019-04-08 NOTE — Telephone Encounter (Signed)
Copied from [id number]. Topic: General - Other >> Apr 08, 2019  3:00 PM Keene Breath wrote: Reason for CRM: Patient called to request a refill for her Tramadol.  Looked on Med List and it was not listed.  Please advise and call patient to discuss.  CB# [phone]

## 2019-04-08 NOTE — Telephone Encounter (Signed)
PDMP reviewed and medication refilled.

## 2019-05-05 ENCOUNTER — Encounter: Payer: Self-pay | Admitting: Family Medicine

## 2019-05-05 ENCOUNTER — Ambulatory Visit (INDEPENDENT_AMBULATORY_CARE_PROVIDER_SITE_OTHER): Payer: Medicare Other | Admitting: Family Medicine

## 2019-05-05 DIAGNOSIS — M545 Low back pain, unspecified: Secondary | ICD-10-CM | POA: Insufficient documentation

## 2019-05-05 MED ORDER — METHYLPREDNISOLONE 4 MG PO TBPK: ORAL_TABLET | ORAL | 0 refills | Status: DC

## 2019-05-05 MED ORDER — BACLOFEN 10 MG PO TABS: 10.0000 mg | ORAL_TABLET | Freq: Three times a day (TID) | ORAL | 0 refills | Status: DC | PRN

## 2019-05-05 NOTE — Assessment & Plan Note (Signed)
No trauma or red flag symptoms.  Start medrol dosepak with baclofen as needed.  Try icing area.  Gentle stretches recommended.  Call if having new or worsening symptoms.

## 2019-05-05 NOTE — Progress Notes (Signed)
Ana Santos - 78 y.o. female MRN [id number]  Date of birth: [birth year]   This visit type was conducted due to national recommendations for restrictions regarding the COVID-19 Pandemic (e.g. social distancing).  This format is felt to be most appropriate for this patient at this time.  All issues noted in this document were discussed and addressed.  No physical exam was performed (except for noted visual exam findings with Video Visits).  I discussed the limitations of evaluation and management by telemedicine and the availability of in person appointments. The patient expressed understanding and agreed to proceed.  I connected with@ on 05/05/19 at 10:00 AM EST by a video enabled telemedicine application and verified that I am speaking with the correct person using two identifiers.  Interactive audio and video telecommunications were attempted between this provider and patient, however failed, due to patient having technical difficulties OR patient did not have access to video capability.  We continued and completed visit with audio only.    Present for visit: Zamariah Dameron    Patient Location: Home [address] [place] [address]   Provider location:   [place]  Chief Complaint  Patient presents with  . Back Pain    pt been having having back pain for about 9days.Marland Kitchen started in lower back now its midway back pain    HPI  Ana Santos is a 78 y.o. female who presents via audio/video conferencing for a telehealth visit today.  She has complaint of low back pain today.  She reports that symptoms started around 8-9 days ago.  She denies any known injury or overuse.  Worse when up and moving, has catching sensation at times.  She has tried multiple OTC muscle rubs and patches without relief.  Tramadol does take the edge off the pain. She denies radiation of pain into legs, weakness, numbness or tingling.  She has not had any bowel or bladder  incontinence problems, urinary frequency or dysuria.  She denies fever, chills or shortness of breath.    ROS:  A comprehensive ROS was completed and negative except as noted per HPI  Past Medical History:  Diagnosis Date  . AN (acoustic neuroma) ([place])   . Aneurysm ([place])   . Anxiety   . Cataract   . Cerebrovascular disease   . Cigarette smoker   . COPD (chronic obstructive pulmonary disease) ([facility])    mild, no inhalers or oxygen used  . Coronary artery disease    no current cardiologist  pt. denies  . Deafness in right ear   . Depression   . Headache    tension headaches due to aneurysm repair with stent  . Heart murmur   . History of kidney stones    15 to 20 yrs ago  . Hyperlipidemia   . Hypothyroidism   . IBS (irritable bowel syndrome)   . Mitral valve prolapse    mild takes beta blocker  . Osteopenia   . Palpitations   . Stroke ([place])    x 3 ministrokes last one feb 2014  . Venous insufficiency   . Vitamin D deficiency     Past Surgical History:  Procedure Laterality Date  . ANEURYSM COILING  11-10   Dr. Estanislado Pandy  . BREAST LUMPECTOMY Right 1986   benign  . DIAGNOSTIC LAPAROSCOPY     Proable laparoscopic right colectomy 03-14-18 Dr. Excell Seltzer  . EYE SURGERY Bilateral    ioc lens for cataracts  . LAPAROSCOPIC RIGHT  COLECTOMY Right 03/14/2018   Procedure: LAPAROSCOPIC ASSISTED  RIGHT COLECTOMY;  Surgeon: Excell Seltzer, MD;  [facility];  Service: General;  Laterality: Right;  . LAPAROSCOPY N/A 03/14/2018   Procedure: LAPAROSCOPY;  Surgeon: Excell Seltzer, MD;  Location[facility];  Service: General;  Laterality: N/A;  . LEFT HEART CATHETERIZATION WITH CORONARY ANGIOGRAM N/A 08/05/2014   Procedure: LEFT HEART CATHETERIZATION WITH CORONARY ANGIOGRAM;  Surgeon: [place] Hampshire, MD;  Location: [facility];  Service: Cardiovascular;  Laterality: N/A;  . TONSILLECTOMY    . TOTAL ABDOMINAL HYSTERECTOMY  1970   complete  . TRANSLABYRINTHINE PROCEDURE  2002    [facility] Dr. Vicie Mutters tumor removal  . TRANSURETHRAL RESECTION OF BLADDER TUMOR N/A 02/08/2018   Procedure: TRANSURETHRAL RESECTION OF BLADDER TUMOR (TURBT)/ POSTOPERATIVE INSTILLATION OF CHEMO THERAPY;  Surgeon: Festus Aloe, MD;  Location: [facility];  Service: Urology;  Laterality: N/A;    Family History  Problem Relation Age of Onset  . Heart attack Mother   . Diabetes Mother   . Cancer Father   . Diabetes Sister   . Diabetes Brother   . Cancer Sister        unkown type  . Lung cancer Brother        Lung  . AAA (abdominal aortic aneurysm) Neg Hx   . Colon cancer Neg Hx   . Stomach cancer Neg Hx   . Esophageal cancer Neg Hx   . Liver cancer Neg Hx   . Pancreatic cancer Neg Hx   . Rectal cancer Neg Hx     Social History   Socioeconomic History  . Marital status: Married    Spouse name: Not on file  . Number of children: Not on file  . Years of education: Not on file  . Highest education level: Not on file  Occupational History  . Occupation: alterations  Social Needs  . Financial resource strain: Not on file  . Food insecurity    Worry: Not on file    Inability: Not on file  . Transportation needs    Medical: Not on file    Non-medical: Not on file  Tobacco Use  . Smoking status: Current Every Day Smoker    Packs/day: 0.25    Years: 50.00    Pack years: 12.50    Types: Cigarettes  . Smokeless tobacco: Never Used  . Tobacco comment: 4-5 cigs daily  is aware she needs to quit  Substance and Sexual Activity  . Alcohol use: No    Alcohol/week: 0.0 standard drinks  . Drug use: No  . Sexual activity: Not Currently  Lifestyle  . Physical activity    Days per week: Not on file    Minutes per session: Not on file  . Stress: Not on file  Relationships  . Social Herbalist on phone: Not on file    Gets together: Not on file    Attends religious service: Not on file    Active member of club or organization: Not on file    Attends  meetings of clubs or organizations: Not on file    Relationship status: Not on file  . Intimate partner violence    Fear of current or ex partner: Not on file    Emotionally abused: Not on file    Physically abused: Not on file    Forced sexual activity: Not on file  Other Topics Concern  . Not on file  Social History Narrative  Pt is married, 5 sons (4 living), 14 grandchildren, 4 great grandchildren.   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  acetaminophen (TYLENOL) 500 MG tablet, Take 500 mg by mouth every 6 (six) hours as needed for mild pain. , Disp: , Rfl:  .  cholecalciferol (VITAMIN D) 1000 UNITS tablet, Take 1,000 Units by mouth daily.  , Disp: , Rfl:  .  clopidogrel (PLAVIX) 75 MG tablet, Take 1 tablet (75 mg total) by mouth daily., Disp: 90 tablet, Rfl: 3 .  clorazepate (TRANXENE) 7.5 MG tablet, Take 1 tablet (7.5 mg total) by mouth 3 (three) times daily as needed for anxiety., Disp: 90 tablet, Rfl: 1 .  hydroxypropyl methylcellulose / hypromellose (ISOPTO TEARS / GONIOVISC) 2.5 % ophthalmic solution, Place 1 drop into both eyes 2 (two) times daily. , Disp: , Rfl:  .  levothyroxine (SYNTHROID) 75 MCG tablet, Take 1 tablet (75 mcg total) by mouth daily before breakfast., Disp: 90 tablet, Rfl: 2 .  metoprolol tartrate (LOPRESSOR) 25 MG tablet, TAKE 1 TABLET TWICE A DAY, Disp: 180 tablet, Rfl: 2 .  potassium chloride (K-DUR) 10 MEQ tablet, Take 2 tabs daily, Disp: 60 tablet, Rfl: 0 .  pramipexole (MIRAPEX) 0.25 MG tablet, Take 1 tablet (0.25 mg total) by mouth at bedtime. TAKE 1 TABLET AT BEDTIME FOR RESTLESS LEG SYMPTOMS, Disp: 90 tablet, Rfl: 3 .  pravastatin (PRAVACHOL) 40 MG tablet, Take 1 tablet (40 mg total) by mouth daily., Disp: 90 tablet, Rfl: 3 .  traMADol (ULTRAM) 50 MG tablet, Take 1 tablet (50 mg total) by mouth every 12 (twelve) hours as needed., Disp: 60 tablet, Rfl: 2 .  amLODipine  (NORVASC) 5 MG tablet, Take 1 tablet (5 mg total) by mouth daily., Disp: 90 tablet, Rfl: 0  EXAM:  VITALS per patient if applicable: BP (!) XX123456 Comment: per pt  Ht 5\' 3"  (1.6 m)   BMI 21.54 kg/m   GENERAL: alert, oriented,in no acute distress  PSYCH/NEURO: pleasant and cooperative, no obvious depression or anxiety, speech and thought processing grossly intact  ASSESSMENT AND PLAN:  Discussed the following assessment and plan:  Low back pain No trauma or red flag symptoms.  Start medrol dosepak with baclofen as needed.  Try icing area.  Gentle stretches recommended.  Call if having new or worsening symptoms.         I discussed the assessment and treatment plan with the patient. The patient was provided an opportunity to ask questions and all were answered. The patient agreed with the plan and demonstrated an understanding of the instructions.   The patient was advised to call back or seek an in-person evaluation if the symptoms worsen or if the condition fails to improve as anticipated.  I provided 25 minutes of non-face-to-face time during this encounter.   Luetta Nutting, DO

## 2019-05-07 ENCOUNTER — Ambulatory Visit: Payer: Self-pay | Admitting: Family Medicine

## 2019-05-07 ENCOUNTER — Other Ambulatory Visit: Payer: Self-pay

## 2019-05-07 NOTE — Telephone Encounter (Signed)
Patient will discontinue baclofen and continue taking steroid. Patient will call back if she continues to have dizziness.

## 2019-05-07 NOTE — Telephone Encounter (Signed)
Please have her continue steroid but discontinue baclofen.

## 2019-05-07 NOTE — Telephone Encounter (Signed)
Pt had virtual visit with Dr. Zigmund Daniel 05/05/2019. Low back pain. Ordered Baclofen and steroid pack. States took baclofen 3 times yesterday and steroids as directed. Last took baclofen at 1830 last night. Reports severe dizziness at HS last night. States "Swimmy headed this am but not as bad."  Lightheaded is constant. Denies any other symptoms, states "Back feels better, still a little pain." BP this am 144/84 HR84. Also reports nausea, onset last night , "Better this AM."  Assured pt TN would route to practice for Dr. [place] Pugh review. Advised to not take medications until she hears back from Dr. Zigmund Daniel. Care advise given per protocol, advised ED if symptoms worsen.  Please advise: [phone] Reason for Disposition . Taking a medicine that could cause dizziness (e.g., blood pressure medications, diuretics)    New medications yesterday, Baclofen and steroid pack  Answer Assessment - Initial Assessment Questions 1. DESCRIPTION: "Describe your dizziness."     "Swimmy headed" 2. LIGHTHEADED: "Do you feel lightheaded?" (e.g., somewhat faint, woozy, weak upon standing)     Yes 3. VERTIGO: "Do you feel like either you or the room is spinning or tilting?" (i.e. vertigo)    no 4. SEVERITY: "How bad is it?"  "Do you feel like you are going to faint?" "Can you stand and walk?"   - MILD - walking normally   - MODERATE - interferes with normal activities (e.g., work, school)    - SEVERE - unable to stand, requires support to walk, feels like passing out now.      Moderate 5. ONSET:  "When did the dizziness begin?"     1830 last evening 6. AGGRAVATING FACTORS: "Does anything make it worse?" (e.g., standing, change in head position)     No 7. HEART RATE: "Can you tell me your heart rate?" "How many beats in 15 seconds?"  (Note: not all patients can do this)       84 8. CAUSE: "What do you think is causing the dizziness?"     New medication 9. RECURRENT SYMPTOM: "Have you had dizziness before?"  If so, ask: "When was the last time?" "What happened that time?"    no 10. OTHER SYMPTOMS: "Do you have any other symptoms?" (e.g., fever, chest pain, vomiting, diarrhea, bleeding)       no  Protocols used: DIZZINESS [facility]

## 2019-05-20 ENCOUNTER — Other Ambulatory Visit: Payer: Self-pay | Admitting: Family Medicine

## 2019-05-20 MED ORDER — AMLODIPINE BESYLATE 5 MG PO TABS: 5.0000 mg | ORAL_TABLET | Freq: Every day | ORAL | 0 refills | Status: DC

## 2019-05-20 NOTE — Telephone Encounter (Signed)
Medication Refill - Medication: amLODipine (NORVASC) 5 MG tablet    Preferred Pharmacy (with phone number or street name):  [facility], [place] [id number] (Phone) [phone] (Fax)     Agent: Please be advised that RX refills may take up to 3 business days. We ask that you follow-up with your pharmacy.

## 2019-06-08 ENCOUNTER — Other Ambulatory Visit: Payer: Self-pay | Admitting: Family Medicine

## 2019-06-08 DIAGNOSIS — I6350 Cerebral infarction due to unspecified occlusion or stenosis of unspecified cerebral artery: Secondary | ICD-10-CM

## 2019-06-09 ENCOUNTER — Other Ambulatory Visit: Payer: Self-pay

## 2019-06-24 ENCOUNTER — Other Ambulatory Visit: Payer: Self-pay | Admitting: Family Medicine

## 2019-06-24 DIAGNOSIS — F411 Generalized anxiety disorder: Secondary | ICD-10-CM

## 2019-07-03 ENCOUNTER — Other Ambulatory Visit: Payer: Self-pay

## 2019-07-04 ENCOUNTER — Encounter: Payer: Self-pay | Admitting: Family Medicine

## 2019-07-04 ENCOUNTER — Ambulatory Visit (INDEPENDENT_AMBULATORY_CARE_PROVIDER_SITE_OTHER): Payer: Medicare Other | Admitting: Family Medicine

## 2019-07-04 DIAGNOSIS — G44229 Chronic tension-type headache, not intractable: Secondary | ICD-10-CM

## 2019-07-04 DIAGNOSIS — E782 Mixed hyperlipidemia: Secondary | ICD-10-CM

## 2019-07-04 DIAGNOSIS — R519 Headache, unspecified: Secondary | ICD-10-CM

## 2019-07-04 DIAGNOSIS — F17200 Nicotine dependence, unspecified, uncomplicated: Secondary | ICD-10-CM

## 2019-07-04 DIAGNOSIS — E039 Hypothyroidism, unspecified: Secondary | ICD-10-CM | POA: Diagnosis not present

## 2019-07-04 DIAGNOSIS — G8929 Other chronic pain: Secondary | ICD-10-CM | POA: Diagnosis not present

## 2019-07-04 DIAGNOSIS — I10 Essential (primary) hypertension: Secondary | ICD-10-CM | POA: Diagnosis not present

## 2019-07-04 MED ORDER — TRAMADOL HCL 50 MG PO TABS: 50.0000 mg | ORAL_TABLET | Freq: Two times a day (BID) | ORAL | 2 refills | Status: DC | PRN

## 2019-07-04 NOTE — Progress Notes (Signed)
Ana Santos - 79 y.o. female MRN [id number]  Date of birth: [birth year]  Subjective Chief Complaint  Patient presents with  . Follow-up    4 mo follow up on BP/bp been stable and doing good, doesn't have last readings    HPI. Ana Santos is a 79 y.o. female with history of HTN, COPD, cerebral aneurysm s/p coiling here today for follow up.  She reports that she is feeling "great".  She had some problems with diplopia previously with no acute changes on MRI and no high grade lesions on carotid ultrasound.  Amlodipine was added to her antihypertensive regimen as pressures were high and diplopia resolved.  She does have chronic headaches that have improved some with better blood pressure control.  She has tramadol that she uses as needed for headaches, she has been taking this for several years without adverse effects. Tried topamax but did not tolerate.   She does continue to smoke with no intention to quit at this time, declines medication to help with cessation.   ROS:  A comprehensive ROS was completed and negative except as noted per HPI  Allergies  Allergen Reactions  . Atorvastatin Other (See Comments)     Lipitor caused arm pain (3/09)  . Hydrocodone-Acetaminophen Other (See Comments)    hallucinations  . Morphine Other (See Comments)    hallucinations  . Nortriptyline     Caused nausea and vomiting and shaking, kept her up all night  . Topamax [Topiramate] Nausea And Vomiting    *dizziness*    Past Medical History:  Diagnosis Date  . AN (acoustic neuroma) ([place])   . Aneurysm ([place])   . Anxiety   . Cataract   . Cerebrovascular disease   . Cigarette smoker   . COPD (chronic obstructive pulmonary disease) ([facility])    mild, no inhalers or oxygen used  . Coronary artery disease    no current cardiologist  pt. denies  . Deafness in right ear   . Depression   . Headache    tension headaches due to aneurysm repair with stent  . Heart murmur   . History of kidney stones     15 to 20 yrs ago  . Hyperlipidemia   . Hypothyroidism   . IBS (irritable bowel syndrome)   . Mitral valve prolapse    mild takes beta blocker  . Osteopenia   . Palpitations   . Stroke ([place])    x 3 ministrokes last one feb 2014  . Venous insufficiency   . Vitamin D deficiency     Past Surgical History:  Procedure Laterality Date  . ANEURYSM COILING  11-10   Dr. Estanislado Pandy  . BREAST LUMPECTOMY Right 1986   benign  . DIAGNOSTIC LAPAROSCOPY     Proable laparoscopic right colectomy 03-14-18 Dr. Excell Seltzer  . EYE SURGERY Bilateral    ioc lens for cataracts  . LAPAROSCOPIC RIGHT COLECTOMY Right 03/14/2018   Procedure: LAPAROSCOPIC ASSISTED  RIGHT COLECTOMY;  Surgeon: Excell Seltzer, MD;  Location: [facility];  Service: General;  Laterality: Right;  . LAPAROSCOPY N/A 03/14/2018   Procedure: LAPAROSCOPY;  Surgeon: Excell Seltzer, MD;  Location: [facility];  Service: General;  Laterality: N/A;  . LEFT HEART CATHETERIZATION WITH CORONARY ANGIOGRAM N/A 08/05/2014   Procedure: LEFT HEART CATHETERIZATION WITH CORONARY ANGIOGRAM;  Surgeon: Wellington Hampshire, MD;  Location: [facility];  Service: Cardiovascular;  Laterality: N/A;  . TONSILLECTOMY    . TOTAL ABDOMINAL HYSTERECTOMY  1970   complete  .  TRANSLABYRINTHINE PROCEDURE  2002   [facility] Dr. Vicie Mutters tumor removal  . TRANSURETHRAL RESECTION OF BLADDER TUMOR N/A 02/08/2018   Procedure: TRANSURETHRAL RESECTION OF BLADDER TUMOR (TURBT)/ POSTOPERATIVE INSTILLATION OF CHEMO THERAPY;  Surgeon: Festus Aloe, MD;  Location: [facility];  Service: Urology;  Laterality: N/A;    Social History   Socioeconomic History  . Marital status: Married    Spouse name: Not on file  . Number of children: Not on file  . Years of education: Not on file  . Highest education level: Not on file  Occupational History  . Occupation: alterations  Tobacco Use  . Smoking status: Current Every Day Smoker    Packs/day: 0.25    Years: 50.00    Pack  years: 12.50    Types: Cigarettes  . Smokeless tobacco: Never Used  . Tobacco comment: 4-5 cigs daily  is aware she needs to quit  Substance and Sexual Activity  . Alcohol use: No    Alcohol/week: 0.0 standard drinks  . Drug use: No  . Sexual activity: Not Currently  Other Topics Concern  . Not on file  Social History Narrative   Pt is married, 5 sons (4 living), 14 grandchildren, 4 great grandchildren.   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 Difficulty of Paying Living Expenses: Not on file  Food Insecurity:   . Worried About Charity fundraiser in the Last Year: Not on file  . Ran Out of Food in the Last Year: Not on file  Transportation Needs:   . Lack of Transportation (Medical): Not on file  . Lack of Transportation (Non-Medical): Not on file  Physical Activity:   . Days of Exercise per Week: Not on file  . Minutes of Exercise per Session: Not on file  Stress:   . Feeling of Stress : Not on file  Social Connections:   . Frequency of Communication with Friends and Family: Not on file  . Frequency of Social Gatherings with Friends and Family: Not on file  . Attends Religious Services: Not on file  . Active Member of Clubs or Organizations: Not on file  . Attends Archivist Meetings: Not on file  . Marital Status: Not on file    Family History  Problem Relation Age of Onset  . Heart attack Mother   . Diabetes Mother   . Cancer Father   . Diabetes Sister   . Diabetes Brother   . Cancer Sister        unkown type  . Lung cancer Brother        Lung  . AAA (abdominal aortic aneurysm) Neg Hx   . Colon cancer Neg Hx   . Stomach cancer Neg Hx   . Esophageal cancer Neg Hx   . Liver cancer Neg Hx   . Pancreatic cancer Neg Hx   . Rectal cancer Neg Hx     Health Maintenance  Topic Date Due  . TETANUS/TDAP  01/04/1960  . INFLUENZA VACCINE  Completed  .  DEXA SCAN  Completed  . PNA vac Low Risk Adult  Completed    ----------------------------------------------------------------------------------------------------------------------------------------------------------------------------------------------------------------- Physical Exam BP 138/78   Pulse 70   Temp (!) 96.9 F (36.1 C) (Tympanic)   Ht 5\' 3"  (1.6 m)   Wt 124 lb 6.4 oz (56.4 kg)   SpO2 97%   BMI 22.04 kg/m  Physical Exam Constitutional:      Appearance: Normal appearance.  HENT:     Head: Normocephalic and atraumatic.     Mouth/Throat:     Mouth: Mucous membranes are moist.  Eyes:     General: No scleral icterus. Cardiovascular:     Rate and Rhythm: Normal rate and regular rhythm.  Pulmonary:     Effort: Pulmonary effort is normal.     Breath sounds: Normal breath sounds.  Skin:    General: Skin is warm and dry.  Neurological:     General: No focal deficit present.     Mental Status: She is alert and oriented to person, place, and time.     Gait: Gait normal.  Psychiatric:        Mood and Affect: Mood normal.        Behavior: Behavior normal.     ------------------------------------------------------------------------------------------------------------------------------------------------------------------------------------------------------------------- Assessment and Plan  Essential hypertension BP is well controlled with current medications.  Recommend continuation at current dose.  Follow low salt diet.   Chronic tension-type headache, not intractable Stable at this time, improved with better BP control.  Has tramadol that she uses as needed.    Hypothyroidism Normal TSH 01/2019, continue levothyroxine at current dose.   CIGARETTE SMOKER I continued to counsel her on smoking cessation, not interested in quitting at this time.   Hyperlipidemia Unable to tolerate other statins, doing well with current dose of pravastatin.       This visit  occurred during the SARS-CoV-2 public health emergency.  Safety protocols were in place, including screening questions prior to the visit, additional usage of staff PPE, and extensive cleaning of exam room while observing appropriate contact time as indicated for disinfecting solutions.

## 2019-07-10 NOTE — Assessment & Plan Note (Addendum)
BP is well controlled with current medications.  Recommend continuation at current dose.  Follow low salt diet.

## 2019-07-10 NOTE — Assessment & Plan Note (Signed)
Unable to tolerate other statins, doing well with current dose of pravastatin.

## 2019-07-10 NOTE — Assessment & Plan Note (Signed)
Stable at this time, improved with better BP control.  Has tramadol that she uses as needed.

## 2019-07-10 NOTE — Assessment & Plan Note (Signed)
Normal TSH 01/2019, continue levothyroxine at current dose.

## 2019-07-10 NOTE — Assessment & Plan Note (Signed)
I continued to counsel her on smoking cessation, not interested in quitting at this time.

## 2019-07-14 DIAGNOSIS — L578 Other skin changes due to chronic exposure to nonionizing radiation: Secondary | ICD-10-CM | POA: Diagnosis not present

## 2019-07-14 DIAGNOSIS — L820 Inflamed seborrheic keratosis: Secondary | ICD-10-CM | POA: Diagnosis not present

## 2019-08-20 DIAGNOSIS — H353132 Nonexudative age-related macular degeneration, bilateral, intermediate dry stage: Secondary | ICD-10-CM | POA: Diagnosis not present

## 2019-08-20 DIAGNOSIS — H02834 Dermatochalasis of left upper eyelid: Secondary | ICD-10-CM | POA: Diagnosis not present

## 2019-08-20 DIAGNOSIS — H16223 Keratoconjunctivitis sicca, not specified as Sjogren's, bilateral: Secondary | ICD-10-CM | POA: Diagnosis not present

## 2019-08-20 DIAGNOSIS — H11153 Pinguecula, bilateral: Secondary | ICD-10-CM | POA: Diagnosis not present

## 2019-08-20 DIAGNOSIS — H35033 Hypertensive retinopathy, bilateral: Secondary | ICD-10-CM | POA: Diagnosis not present

## 2019-08-20 DIAGNOSIS — H18413 Arcus senilis, bilateral: Secondary | ICD-10-CM | POA: Diagnosis not present

## 2019-08-20 DIAGNOSIS — H02831 Dermatochalasis of right upper eyelid: Secondary | ICD-10-CM | POA: Diagnosis not present

## 2019-08-20 DIAGNOSIS — H11823 Conjunctivochalasis, bilateral: Secondary | ICD-10-CM | POA: Diagnosis not present

## 2019-08-20 DIAGNOSIS — Z961 Presence of intraocular lens: Secondary | ICD-10-CM | POA: Diagnosis not present

## 2019-08-22 ENCOUNTER — Other Ambulatory Visit: Payer: Self-pay

## 2019-08-22 ENCOUNTER — Telehealth: Payer: Self-pay | Admitting: General Practice

## 2019-08-22 MED ORDER — AMLODIPINE BESYLATE 5 MG PO TABS: 5.0000 mg | ORAL_TABLET | Freq: Every day | ORAL | 0 refills | Status: DC

## 2019-08-22 NOTE — Telephone Encounter (Signed)
Rx sent in

## 2019-08-22 NOTE — Telephone Encounter (Signed)
Last OV 07/04/19 Last fill 05/20/19  #90/0

## 2019-08-22 NOTE — Telephone Encounter (Signed)
Patient is calling and requesting a refill for amlodipine sent to [facility] in [place]. CB is [phone].

## 2019-08-28 DIAGNOSIS — M6701 Short Achilles tendon (acquired), right ankle: Secondary | ICD-10-CM | POA: Diagnosis not present

## 2019-08-28 DIAGNOSIS — M722 Plantar fascial fibromatosis: Secondary | ICD-10-CM | POA: Diagnosis not present

## 2019-08-28 DIAGNOSIS — M7731 Calcaneal spur, right foot: Secondary | ICD-10-CM | POA: Diagnosis not present

## 2019-09-15 DIAGNOSIS — N3090 Cystitis, unspecified without hematuria: Secondary | ICD-10-CM | POA: Diagnosis not present

## 2019-09-15 DIAGNOSIS — M25552 Pain in left hip: Secondary | ICD-10-CM | POA: Diagnosis not present

## 2019-09-15 DIAGNOSIS — N3001 Acute cystitis with hematuria: Secondary | ICD-10-CM | POA: Diagnosis not present

## 2019-09-16 DIAGNOSIS — M722 Plantar fascial fibromatosis: Secondary | ICD-10-CM | POA: Diagnosis not present

## 2019-09-16 DIAGNOSIS — M6701 Short Achilles tendon (acquired), right ankle: Secondary | ICD-10-CM | POA: Diagnosis not present

## 2019-09-23 DIAGNOSIS — M7062 Trochanteric bursitis, left hip: Secondary | ICD-10-CM | POA: Diagnosis not present

## 2019-09-26 DIAGNOSIS — M25652 Stiffness of left hip, not elsewhere classified: Secondary | ICD-10-CM | POA: Diagnosis not present

## 2019-09-26 DIAGNOSIS — M25552 Pain in left hip: Secondary | ICD-10-CM | POA: Diagnosis not present

## 2019-09-26 DIAGNOSIS — M6281 Muscle weakness (generalized): Secondary | ICD-10-CM | POA: Diagnosis not present

## 2019-10-01 DIAGNOSIS — Z8551 Personal history of malignant neoplasm of bladder: Secondary | ICD-10-CM | POA: Diagnosis not present

## 2019-10-03 DIAGNOSIS — M6281 Muscle weakness (generalized): Secondary | ICD-10-CM | POA: Diagnosis not present

## 2019-10-03 DIAGNOSIS — M25652 Stiffness of left hip, not elsewhere classified: Secondary | ICD-10-CM | POA: Diagnosis not present

## 2019-10-03 DIAGNOSIS — M25552 Pain in left hip: Secondary | ICD-10-CM | POA: Diagnosis not present

## 2019-10-05 IMAGING — CT CT ENTEROGRAPHY (ABD-PELV W/ CM)
2 of 6 series · 14 of 46 positions shown, 16 images · IV contrast (ISOVUE 300)
Comparison: 11/14/2017

CLINICAL DATA: Right lower quadrant pain. Rule out distal small
bowel lesion.

EXAM:
CT ABDOMEN AND PELVIS WITH CONTRAST (ENTEROGRAPHY)
TECHNIQUE: Multidetector CT of the abdomen and pelvis during bolus
administration of intravenous contrast. Negative oral contrast was
given.
CONTRAST:  100mL EGC4TS-M33 IOPAMIDOL (EGC4TS-M33) INJECTION 61%

[Series 4: entero thins · axial · 0.72mm/px · z∈[-414,-36]mm · 11 of 213 slices shown, 13 images]
[im 12/213  soft-tissue]
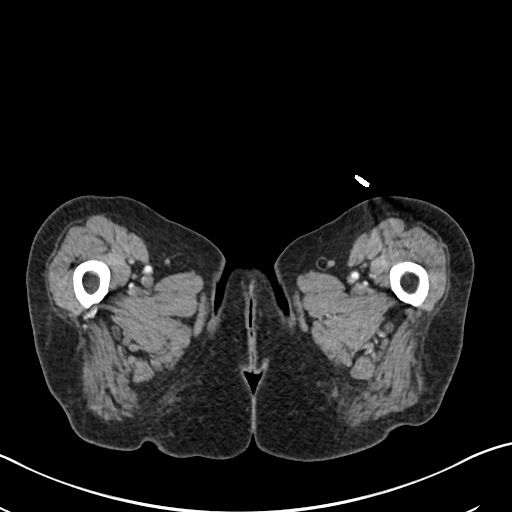
[im 12/213  bone]
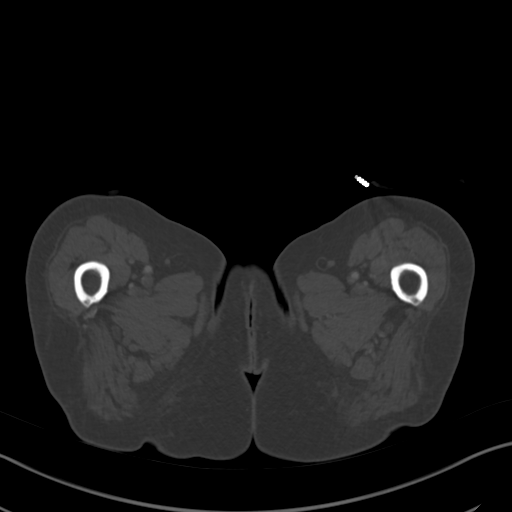
[im 36/213  soft-tissue]
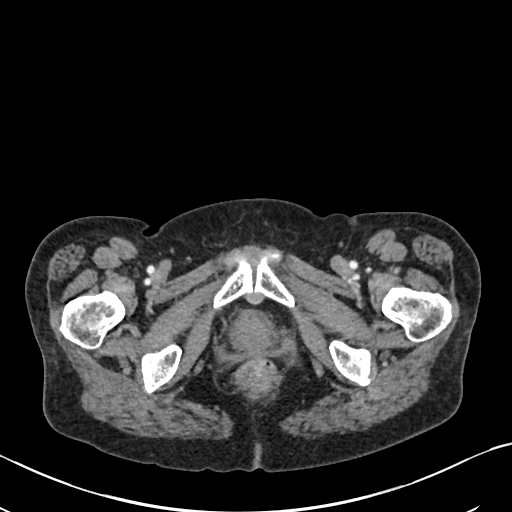
[im 48/213  soft-tissue]
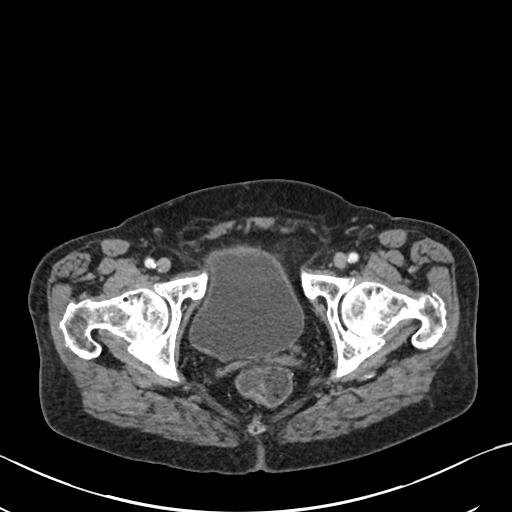
[im 71/213  soft-tissue]
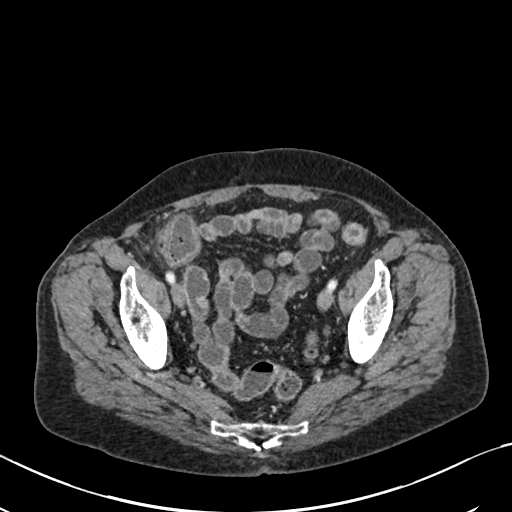
[im 83/213  soft-tissue]
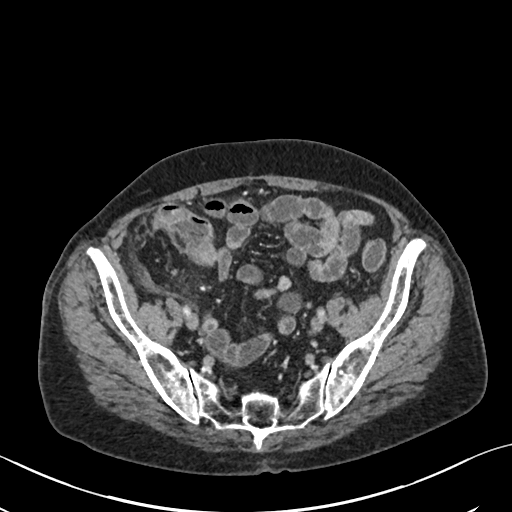
[im 107/213  soft-tissue]
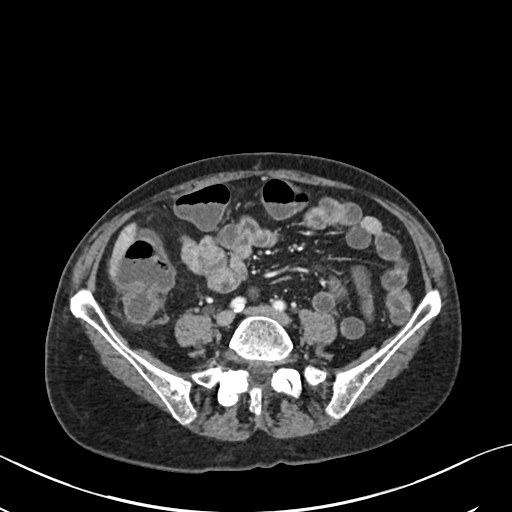
[im 130/213  soft-tissue]
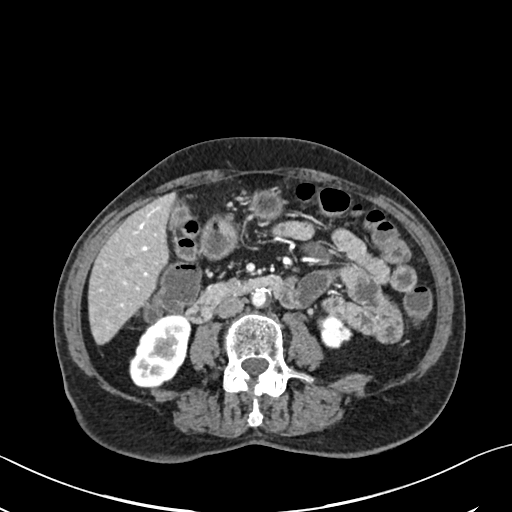
[im 142/213  soft-tissue]
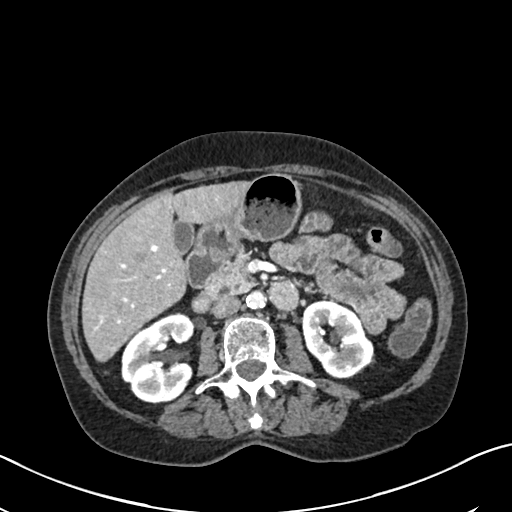
[im 165/213  soft-tissue]
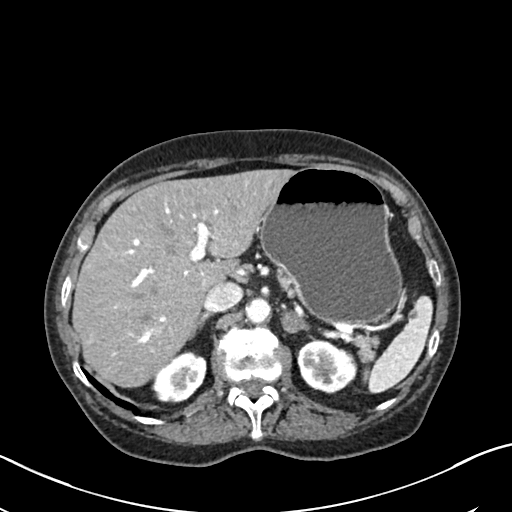
[im 165/213  bone]
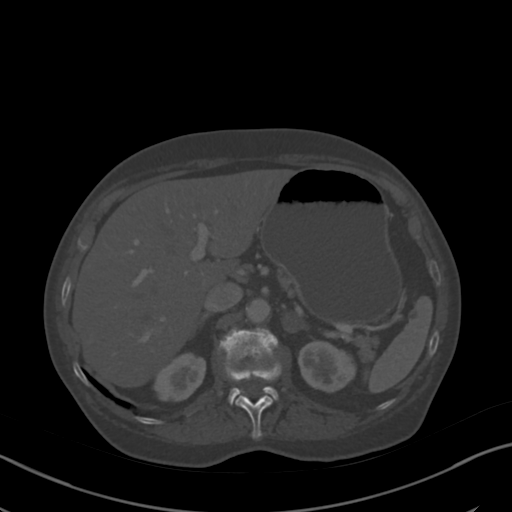
[im 177/213  soft-tissue]
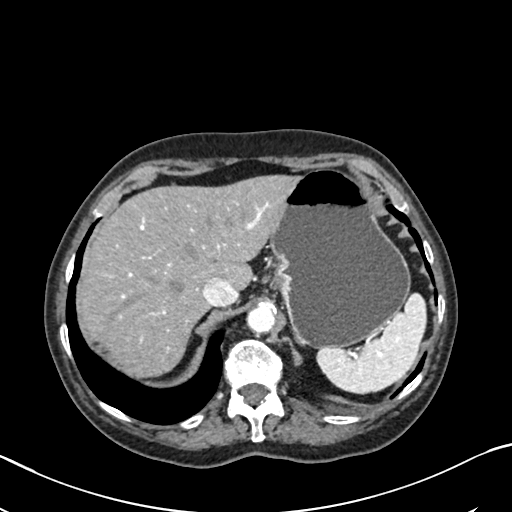
[im 201/213  soft-tissue]
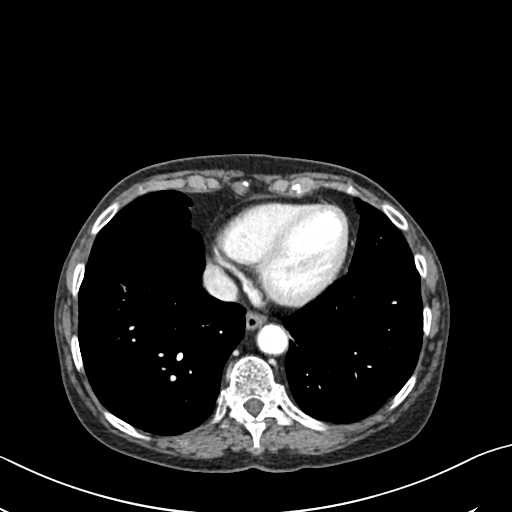

[Series 7: coronal · coronal · 0.58mm/px · 3 of 73 slices shown]
[im 25/73  soft-tissue]
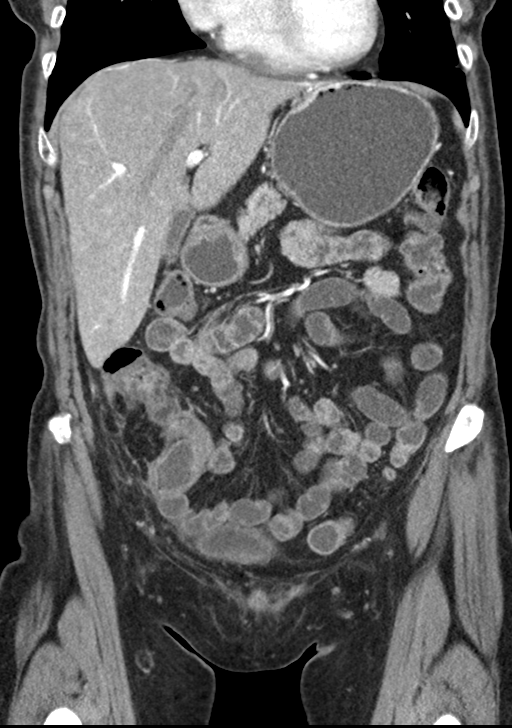
[im 33/73  soft-tissue]
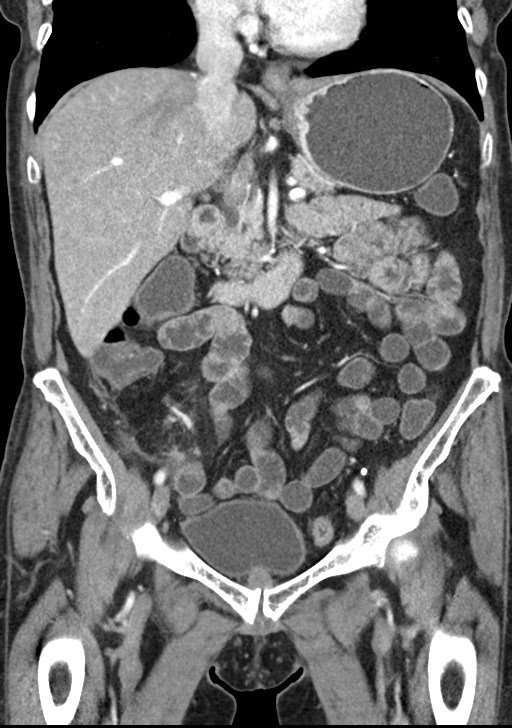
[im 41/73  soft-tissue]
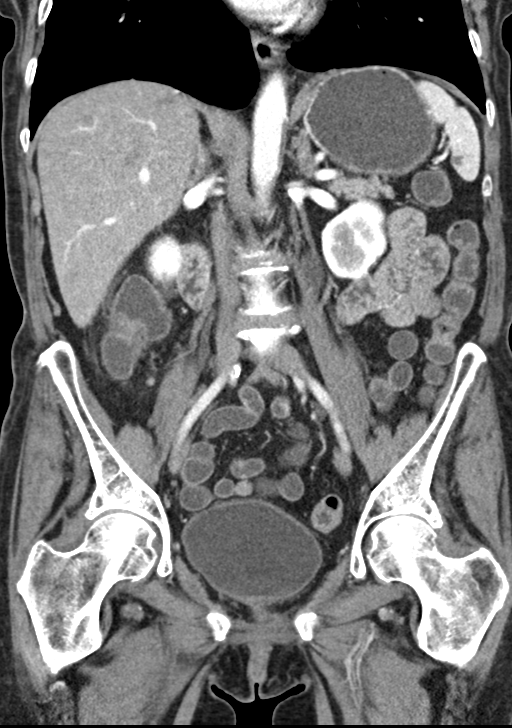

[14 of 46 positions shown; findings below may reference images not displayed]

FINDINGS: Lower chest: left lower lobe scarring. Centrilobular emphysema.
Normal heart size without pericardial or pleural effusion.

Hepatobiliary: normal liver. Normal gallbladder, without biliary
ductal dilatation.

Pancreas: Normal, without mass or ductal dilatation.

Spleen: Subcentimeter low-density splenic lesion is of doubtful
clinical significance.

Adrenals/Urinary Tract: Normal right adrenal gland. 1.6 cm left
adrenal gland is unchanged. Normal kidneys, without hydronephrosis.
Subtle right-sided bladder wall irregularity, including on 65/2.
This may have also been present on 62/2 of the 11/14/2017 exam and
[DATE] of 10/23/2017 study. See also sagittal image 39/8 and image
41/8.

Stomach/Bowel: Is Normal stomach, without wall thickening. Again
identified is mild-to-moderate interstitial thickening posterior and
lateral to the ileocecal junction and cecum. Subtle wall thickening
of and mucosal hyperenhancement within the cecum including on image
139/4. Again identified is a calcification posterior and lateral to
the cecum.

The extent of small bowel distension with neutral contrast is
moderate. No evidence of terminal ileitis. The remainder of the
small bowel demonstrates no mass, mucosal hyperenhancement, wall
thickening, or mesenteric abnormality.

Vascular/Lymphatic: Advanced aortic and branch vessel
atherosclerosis. No abdominopelvic adenopathy.

Reproductive: Hysterectomy.  No adnexal mass.

Other: No significant free fluid. Moderate pelvic floor laxity. No
free intraperitoneal air.

Musculoskeletal: Lumbosacral spondylosis.
IMPRESSION: 1. Redemonstration of interstitial thickening posterolateral to the
ileocecal junction and cecum. Concurrent subtle cecal wall
thickening and mucosal hyperenhancement. Findings favor infectious
or inflammatory colitis. Ischemia felt unlikely given distribution.
2. No small bowel pathology identified.
3. **An incidental finding of potential clinical significance has
been found. Subtle areas of irregularity about the right bladder
wall are suspicious for plaque-like foci of urothelial carcinoma.
Recommend urology consultation and possibly cystoscopy.**
4. Aortic atherosclerosis ([id number]) and emphysema ([id number]).
5. Left adrenal nodule, unchanged. Recommend follow-up adrenal
protocol CT in approximately Tuesday September, 2018. This recommendation
follows ACR consensus guidelines: Management of Incidental Adrenal
Masses: A White Paper of the ACR Incidental Findings Committee. [HOSPITAL] 9742;[id number].

These results will be called to the ordering clinician or
representative by the Radiologist Assistant, and communication
documented in the PACS or zVision Dashboard.

## 2019-10-06 DIAGNOSIS — M25652 Stiffness of left hip, not elsewhere classified: Secondary | ICD-10-CM | POA: Diagnosis not present

## 2019-10-06 DIAGNOSIS — M6281 Muscle weakness (generalized): Secondary | ICD-10-CM | POA: Diagnosis not present

## 2019-10-06 DIAGNOSIS — M25552 Pain in left hip: Secondary | ICD-10-CM | POA: Diagnosis not present

## 2019-10-08 DIAGNOSIS — M25552 Pain in left hip: Secondary | ICD-10-CM | POA: Diagnosis not present

## 2019-10-08 DIAGNOSIS — M6281 Muscle weakness (generalized): Secondary | ICD-10-CM | POA: Diagnosis not present

## 2019-10-08 DIAGNOSIS — M25652 Stiffness of left hip, not elsewhere classified: Secondary | ICD-10-CM | POA: Diagnosis not present

## 2019-10-09 DIAGNOSIS — M6701 Short Achilles tendon (acquired), right ankle: Secondary | ICD-10-CM | POA: Diagnosis not present

## 2019-10-09 DIAGNOSIS — M722 Plantar fascial fibromatosis: Secondary | ICD-10-CM | POA: Diagnosis not present

## 2019-10-15 ENCOUNTER — Telehealth: Payer: Self-pay | Admitting: General Practice

## 2019-10-15 DIAGNOSIS — M6281 Muscle weakness (generalized): Secondary | ICD-10-CM | POA: Diagnosis not present

## 2019-10-15 DIAGNOSIS — G8929 Other chronic pain: Secondary | ICD-10-CM

## 2019-10-15 DIAGNOSIS — M25552 Pain in left hip: Secondary | ICD-10-CM | POA: Diagnosis not present

## 2019-10-15 DIAGNOSIS — M25652 Stiffness of left hip, not elsewhere classified: Secondary | ICD-10-CM | POA: Diagnosis not present

## 2019-10-15 DIAGNOSIS — R519 Headache, unspecified: Secondary | ICD-10-CM

## 2019-10-15 NOTE — Telephone Encounter (Signed)
Last OV 07/04/19 Last fill 07/04/19  #60/2

## 2019-10-15 NOTE — Telephone Encounter (Signed)
Patient is calling and requesting a refill for Tramadol sent to [facility]. Informed patient that she needed to select a TOC appointment with another provider due to Dr. Zigmund Daniel leaving the practice. CB is [phone]

## 2019-10-16 MED ORDER — TRAMADOL HCL 50 MG PO TABS: 50.0000 mg | ORAL_TABLET | Freq: Two times a day (BID) | ORAL | 0 refills | Status: DC | PRN

## 2019-10-16 NOTE — Telephone Encounter (Signed)
I will refill x 1 mo and then pt does need to establish with new provider. She transferred previously from me to Dr. Zigmund Daniel so she may want to consider seeing [facility] or Dr. Ethelene Hal

## 2019-10-16 NOTE — Telephone Encounter (Signed)
Pt informed

## 2019-10-23 DIAGNOSIS — M6701 Short Achilles tendon (acquired), right ankle: Secondary | ICD-10-CM | POA: Diagnosis not present

## 2019-10-23 DIAGNOSIS — M722 Plantar fascial fibromatosis: Secondary | ICD-10-CM | POA: Diagnosis not present

## 2019-10-29 ENCOUNTER — Encounter: Payer: Self-pay | Admitting: Family Medicine

## 2019-10-29 ENCOUNTER — Ambulatory Visit (INDEPENDENT_AMBULATORY_CARE_PROVIDER_SITE_OTHER): Payer: Medicare Other | Admitting: Family Medicine

## 2019-10-29 ENCOUNTER — Other Ambulatory Visit: Payer: Self-pay

## 2019-10-29 VITALS — BP 114/70 | HR 69 | Temp 97.6°F | Ht 63.0 in | Wt 121.4 lb

## 2019-10-29 DIAGNOSIS — E039 Hypothyroidism, unspecified: Secondary | ICD-10-CM | POA: Diagnosis not present

## 2019-10-29 DIAGNOSIS — R519 Headache, unspecified: Secondary | ICD-10-CM | POA: Diagnosis not present

## 2019-10-29 DIAGNOSIS — F17200 Nicotine dependence, unspecified, uncomplicated: Secondary | ICD-10-CM | POA: Diagnosis not present

## 2019-10-29 DIAGNOSIS — G8929 Other chronic pain: Secondary | ICD-10-CM | POA: Diagnosis not present

## 2019-10-29 DIAGNOSIS — G44229 Chronic tension-type headache, not intractable: Secondary | ICD-10-CM

## 2019-10-29 DIAGNOSIS — E782 Mixed hyperlipidemia: Secondary | ICD-10-CM

## 2019-10-29 DIAGNOSIS — I10 Essential (primary) hypertension: Secondary | ICD-10-CM | POA: Diagnosis not present

## 2019-10-29 DIAGNOSIS — D508 Other iron deficiency anemias: Secondary | ICD-10-CM | POA: Diagnosis not present

## 2019-10-29 DIAGNOSIS — E559 Vitamin D deficiency, unspecified: Secondary | ICD-10-CM

## 2019-10-29 LAB — BASIC METABOLIC PANEL
BUN: 16 mg/dL (ref 6–23)
CO2: 26 mEq/L (ref 19–32)
Calcium: 9 mg/dL (ref 8.4–10.5)
Chloride: 105 mEq/L (ref 96–112)
Creatinine, Ser: 0.71 mg/dL (ref 0.40–1.20)
GFR: 79.45 mL/min (ref >60.00)
Glucose, Bld: 89 mg/dL (ref 70–99)
Potassium: 4.2 mEq/L (ref 3.5–5.1)
Sodium: 139 mEq/L (ref 135–145)

## 2019-10-29 LAB — LIPID PANEL
Cholesterol: 180 mg/dL (ref 0–200)
HDL: 63.5 mg/dL (ref >39.00)
LDL Cholesterol: 104 mg/dL — ABNORMAL HIGH (ref 0–99)
NonHDL: 116.98
Total CHOL/HDL Ratio: 3
Triglycerides: 63 mg/dL (ref 0.0–149.0)
VLDL: 12.6 mg/dL (ref 0.0–40.0)

## 2019-10-29 LAB — URINALYSIS, ROUTINE W REFLEX MICROSCOPIC
Bilirubin Urine: NEGATIVE
Hgb urine dipstick: NEGATIVE
Ketones, ur: NEGATIVE
Nitrite: NEGATIVE
RBC / HPF: NONE SEEN (ref 0–?)
Specific Gravity, Urine: 1.02 (ref 1.000–1.030)
Total Protein, Urine: NEGATIVE
Urine Glucose: NEGATIVE
Urobilinogen, UA: 0.2 (ref 0.0–1.0)
pH: 6 (ref 5.0–8.0)

## 2019-10-29 LAB — VITAMIN D 25 HYDROXY (VIT D DEFICIENCY, FRACTURES): VITD: 18.77 ng/mL — ABNORMAL LOW (ref 30.00–100.00)

## 2019-10-29 LAB — CBC
HCT: 43 % (ref 36.0–46.0)
Hemoglobin: 14.2 g/dL (ref 12.0–15.0)
MCHC: 33 g/dL (ref 30.0–36.0)
MCV: 91.5 fl (ref 78.0–100.0)
Platelets: 217 10*3/uL (ref 150.0–400.0)
RBC: 4.7 Mil/uL (ref 3.87–5.11)
RDW: 13.9 % (ref 11.5–15.5)
WBC: 5.5 10*3/uL (ref 4.0–10.5)

## 2019-10-29 LAB — TSH: TSH: 1.78 u[IU]/mL (ref 0.35–4.50)

## 2019-10-29 MED ORDER — TRAMADOL HCL 50 MG PO TABS: 50.0000 mg | ORAL_TABLET | Freq: Two times a day (BID) | ORAL | 2 refills | Status: DC | PRN

## 2019-10-29 NOTE — Progress Notes (Addendum)
Established Patient Office Visit  Subjective:  Patient ID: Ana Santos, female    DOB: [birth year]  Age: 79 y.o. MRN: [id number]  CC:  Chief Complaint  Patient presents with  . Transitions Of Care    CPE, no concerns.     HPI Ana Santos presents for establishment of care by way of transfer.  Seen today for follow-up of her hypertension, hyperlipidemia, chronic headache status post 2 brain surgeries, chronic orthopedic aches and pains doing include chronic lower back pain.  She also has a history of hypothyroidism, vitamin D deficiency and iron deficiency anemia.  She complains of poor appetite.  She has not lost weight.  She continues to smoke 4 to 5 cigarettes a day and is not willing to quit.  She lives with her husband.  She does not drink alcohol or use illicit drugs.  Past Medical History:  Diagnosis Date  . AN (acoustic neuroma) ([place])   . Aneurysm ([place])   . Anxiety   . Cataract   . Cerebrovascular disease   . Cigarette smoker   . COPD (chronic obstructive pulmonary disease) ([facility])    mild, no inhalers or oxygen used  . Coronary artery disease    no current cardiologist  pt. denies  . Deafness in right ear   . Depression   . Headache    tension headaches due to aneurysm repair with stent  . Heart murmur   . History of kidney stones    15 to 20 yrs ago  . Hyperlipidemia   . Hypothyroidism   . IBS (irritable bowel syndrome)   . Mitral valve prolapse    mild takes beta blocker  . Osteopenia   . Palpitations   . Stroke ([place])    x 3 ministrokes last one feb 2014  . Venous insufficiency   . Vitamin D deficiency     Past Surgical History:  Procedure Laterality Date  . ANEURYSM COILING  11-10   Dr. Estanislado Pandy  . BREAST LUMPECTOMY Right 1986   benign  . DIAGNOSTIC LAPAROSCOPY     Proable laparoscopic right colectomy 03-14-18 Dr. Excell Seltzer  . EYE SURGERY Bilateral    ioc lens for cataracts  . LAPAROSCOPIC RIGHT COLECTOMY Right 03/14/2018   Procedure:  LAPAROSCOPIC ASSISTED  RIGHT COLECTOMY;  Surgeon: Excell Seltzer, MD;  Location: [facility];  Service: General;  Laterality: Right;  . LAPAROSCOPY N/A 03/14/2018   Procedure: LAPAROSCOPY;  Surgeon: Excell Seltzer, MD;  Location: [facility];  Service: General;  Laterality: N/A;  . LEFT HEART CATHETERIZATION WITH CORONARY ANGIOGRAM N/A 08/05/2014   Procedure: LEFT HEART CATHETERIZATION WITH CORONARY ANGIOGRAM;  Surgeon: Wellington Hampshire, MD;  Location: [facility];  Service: Cardiovascular;  Laterality: N/A;  . TONSILLECTOMY    . TOTAL ABDOMINAL HYSTERECTOMY  1970   complete  . TRANSLABYRINTHINE PROCEDURE  2002   [facility] Dr. Vicie Mutters tumor removal  . TRANSURETHRAL RESECTION OF BLADDER TUMOR N/A 02/08/2018   Procedure: TRANSURETHRAL RESECTION OF BLADDER TUMOR (TURBT)/ POSTOPERATIVE INSTILLATION OF CHEMO THERAPY;  Surgeon: Festus Aloe, MD;  Location: [facility];  Service: Urology;  Laterality: N/A;    Family History  Problem Relation Age of Onset  . Heart attack Mother   . Diabetes Mother   . Cancer Father   . Diabetes Sister   . Diabetes Brother   . Cancer Sister        unkown type  . Lung cancer Brother        Lung  .  AAA (abdominal aortic aneurysm) Neg Hx   . Colon cancer Neg Hx   . Stomach cancer Neg Hx   . Esophageal cancer Neg Hx   . Liver cancer Neg Hx   . Pancreatic cancer Neg Hx   . Rectal cancer Neg Hx     Social History   Socioeconomic History  . Marital status: Married    Spouse name: Not on file  . Number of children: Not on file  . Years of education: Not on file  . Highest education level: Not on file  Occupational History  . Occupation: alterations  Tobacco Use  . Smoking status: Current Every Day Smoker    Packs/day: 0.25    Years: 50.00    Pack years: 12.50    Types: Cigarettes  . Smokeless tobacco: Never Used  . Tobacco comment: 4-5 cigs daily  is aware she needs to quit  Substance and Sexual Activity  . Alcohol use: No     Alcohol/week: 0.0 standard drinks  . Drug use: No  . Sexual activity: Not Currently  Other Topics Concern  . Not on file  Social History Narrative   Pt is married, 5 sons (4 living), 14 grandchildren, 4 great grandchildren.   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 Difficulty of Paying Living Expenses:   Food Insecurity:   . Worried About Charity fundraiser in the Last Year:   . Arboriculturist in the Last Year:   Transportation Needs:   . Film/video editor (Medical):   Marland Kitchen Lack of Transportation (Non-Medical):   Physical Activity:   . Days of Exercise per Week:   . Minutes of Exercise per Session:   Stress:   . Feeling of Stress :   Social Connections:   . Frequency of Communication with Friends and Family:   . Frequency of Social Gatherings with Friends and Family:   . Attends Religious Services:   . Active Member of Clubs or Organizations:   . Attends Archivist Meetings:   Marland Kitchen Marital Status:   Intimate Partner Violence:   . Fear of Current or Ex-Partner:   . Emotionally Abused:   Marland Kitchen Physically Abused:   . Sexually Abused:     Outpatient Medications Prior to Visit  Medication Sig Dispense Refill  . acetaminophen (TYLENOL) 500 MG tablet Take 500 mg by mouth every 6 (six) hours as needed for mild pain.     Marland Kitchen amLODipine (NORVASC) 5 MG tablet Take 1 tablet (5 mg total) by mouth daily. 90 tablet 0  . cholecalciferol (VITAMIN D) 1000 UNITS tablet Take 1,000 Units by mouth daily.      . clopidogrel (PLAVIX) 75 MG tablet TAKE 1 TABLET DAILY 90 tablet 3  . diclofenac Sodium (VOLTAREN) 1 % GEL     . hydroxypropyl methylcellulose / hypromellose (ISOPTO TEARS / GONIOVISC) 2.5 % ophthalmic solution Place 1 drop into both eyes 2 (two) times daily.     . metoprolol tartrate (LOPRESSOR) 25 MG tablet TAKE 1 TABLET TWICE A DAY 180 tablet 2  . potassium chloride (K-DUR) 10  MEQ tablet Take 2 tabs daily 60 tablet 0  . pramipexole (MIRAPEX) 0.25 MG tablet Take 1 tablet (0.25 mg total) by mouth at bedtime. TAKE 1 TABLET AT BEDTIME FOR RESTLESS LEG SYMPTOMS 90 tablet 3  . pravastatin (PRAVACHOL) 40 MG tablet Take 1 tablet (40  mg total) by mouth daily. 90 tablet 3  . clorazepate (TRANXENE) 7.5 MG tablet TAKE 1 TABLET THREE TIMES A DAY AS NEEDED FOR ANXIETY 90 tablet 1  . levothyroxine (SYNTHROID) 75 MCG tablet Take 1 tablet (75 mcg total) by mouth daily before breakfast. 90 tablet 2  . traMADol (ULTRAM) 50 MG tablet Take 1 tablet (50 mg total) by mouth every 12 (twelve) hours as needed. 60 tablet 0  . metoprolol tartrate (LOPRESSOR) 25 MG tablet by mouth     No facility-administered medications prior to visit.    Allergies  Allergen Reactions  . Atorvastatin Other (See Comments)     Lipitor caused arm pain (3/09)  . Hydrocodone-Acetaminophen Other (See Comments)    hallucinations  . Morphine Other (See Comments)    hallucinations  . Nortriptyline     Caused nausea and vomiting and shaking, kept her up all night  . Topamax [Topiramate] Nausea And Vomiting    *dizziness*    ROS Review of Systems  Constitutional: Negative.   HENT: Negative.   Respiratory: Negative for chest tightness, shortness of breath and wheezing.   Cardiovascular: Negative for chest pain and palpitations.  Gastrointestinal: Negative for abdominal pain, anal bleeding and blood in stool.  Endocrine: Negative for polyphagia and polyuria.  Genitourinary: Negative for difficulty urinating, frequency and urgency.  Musculoskeletal: Positive for arthralgias and back pain.  Skin: Negative for pallor.  Allergic/Immunologic: Negative for immunocompromised state.  Neurological: Positive for headaches. Negative for tremors and speech difficulty.  Hematological: Does not bruise/bleed easily.   Depression screen [facility] 2/9 10/29/2019 07/04/2019 10/20/2015  Decreased Interest 0 0 0  Down, Depressed,  Hopeless 0 0 1  PHQ - 2 Score 0 0 1  Altered sleeping 0 - -  Tired, decreased energy 1 - -  Change in appetite 2 - -  Feeling bad or failure about yourself  0 - -  Trouble concentrating 0 - -  Moving slowly or fidgety/restless 0 - -  Suicidal thoughts 0 - -  PHQ-9 Score 3 - -  Difficult doing work/chores Not difficult at all - -       Objective:    Physical Exam  Constitutional: She is oriented to person, place, and time. She appears well-developed and well-nourished. No distress.  HENT:  Head: Normocephalic and atraumatic.  Right Ear: External ear normal.  Left Ear: External ear normal.  Eyes: Conjunctivae are normal. Right eye exhibits no discharge. Left eye exhibits no discharge. No scleral icterus.  Neck: No JVD present. No tracheal deviation present. No thyromegaly present.  Cardiovascular: Normal rate, regular rhythm and normal heart sounds.  Pulmonary/Chest: Effort normal and breath sounds normal. No stridor.  Abdominal: Bowel sounds are normal.  Musculoskeletal:        General: No edema.  Lymphadenopathy:    She has no cervical adenopathy.  Neurological: She is oriented to person, place, and time.  Skin: Skin is warm and dry. She is not diaphoretic.  Psychiatric: She has a normal mood and affect.    BP 114/70   Pulse 69   Temp 97.6 F (36.4 C) (Tympanic)   Ht 5\' 3"  (1.6 m)   Wt 121 lb 6.4 oz (55.1 kg)   SpO2 97%   BMI 21.51 kg/m  Wt Readings from Last 3 Encounters:  10/29/19 121 lb 6.4 oz (55.1 kg)  07/04/19 124 lb 6.4 oz (56.4 kg)  03/04/19 121 lb 9.6 oz (55.2 kg)     Health Maintenance Due  Topic  Date Due  . COVID-19 Vaccine (1) Never done    There are no preventive care reminders to display for this patient.  Lab Results  Component Value Date   TSH 1.78 10/29/2019   Lab Results  Component Value Date   WBC 5.5 10/29/2019   HGB 14.2 10/29/2019   HCT 43.0 10/29/2019   MCV 91.5 10/29/2019   PLT 217.0 10/29/2019   Lab Results  Component  Value Date   NA 139 10/29/2019   K 4.2 10/29/2019   CO2 26 10/29/2019   GLUCOSE 89 10/29/2019   BUN 16 10/29/2019   CREATININE 0.71 10/29/2019   BILITOT 0.3 05/22/2018   ALKPHOS 90 05/22/2018   AST 12 05/22/2018   ALT 5 05/22/2018   PROT 7.4 05/22/2018   ALBUMIN 4.0 05/22/2018   CALCIUM 9.0 10/29/2019   ANIONGAP 6 03/23/2018   GFR 79.45 10/29/2019   Lab Results  Component Value Date   CHOL 180 10/29/2019   Lab Results  Component Value Date   HDL 63.50 10/29/2019   Lab Results  Component Value Date   LDLCALC 104 (H) 10/29/2019   Lab Results  Component Value Date   TRIG 63.0 10/29/2019   Lab Results  Component Value Date   CHOLHDL 3 10/29/2019   No results found for: HGBA1C    Assessment & Plan:   Problem List Items Addressed This Visit      Cardiovascular and Mediastinum   Essential hypertension   Relevant Medications   metoprolol tartrate (LOPRESSOR) 25 MG tablet   Other Relevant Orders   CBC (Completed)   Basic metabolic panel (Completed)   Urinalysis, Routine w reflex microscopic (Completed)     Endocrine   Hypothyroidism   Relevant Medications   metoprolol tartrate (LOPRESSOR) 25 MG tablet   levothyroxine (SYNTHROID) 75 MCG tablet   Other Relevant Orders   TSH (Completed)     Nervous and Auditory   Chronic tension-type headache, not intractable   Relevant Medications   metoprolol tartrate (LOPRESSOR) 25 MG tablet   traMADol (ULTRAM) 50 MG tablet     Other   Vitamin D deficiency   Relevant Orders   VITAMIN D 25 Hydroxy (Vit-D Deficiency, Fractures) (Completed)   Hyperlipidemia   Relevant Medications   metoprolol tartrate (LOPRESSOR) 25 MG tablet   Other Relevant Orders   Lipid panel (Completed)   CIGARETTE SMOKER   Cephalalgia - Primary   Relevant Medications   metoprolol tartrate (LOPRESSOR) 25 MG tablet   traMADol (ULTRAM) 50 MG tablet   Anemia, iron deficiency   Relevant Orders   Iron, TIBC and Ferritin Panel (Completed)       Meds ordered this encounter  Medications  . traMADol (ULTRAM) 50 MG tablet    Sig: Take 1 tablet (50 mg total) by mouth every 12 (twelve) hours as needed for severe pain.    Dispense:  60 tablet    Refill:  2  . levothyroxine (SYNTHROID) 75 MCG tablet    Sig: Take 1 tablet (75 mcg total) by mouth daily before breakfast.    Dispense:  90 tablet    Refill:  2    Follow-up: Return in about 3 months (around 01/29/2020).  And willing to continue the tramadol for her or her chronic headaches and other orthopedic aches and pains but a.m. unable to continue the Tranxene in combination with all TRAM.  She said that would be okay.  Libby Maw, MD

## 2019-10-30 LAB — IRON,TIBC AND FERRITIN PANEL
%SAT: 29 % (calc) (ref 16–45)
Ferritin: 35 ng/mL (ref 16–288)
Iron: 101 ug/dL (ref 45–160)
TIBC: 343 mcg/dL (calc) (ref 250–450)

## 2019-10-30 MED ORDER — LEVOTHYROXINE SODIUM 75 MCG PO TABS: 75.0000 ug | ORAL_TABLET | Freq: Every day | ORAL | 2 refills | Status: DC

## 2019-10-30 NOTE — Addendum Note (Signed)
Addended by: Jon Billings on: 10/30/2019 01:30 PM   Modules accepted: Orders

## 2019-11-24 ENCOUNTER — Telehealth: Payer: Self-pay | Admitting: Family Medicine

## 2019-11-24 NOTE — Telephone Encounter (Signed)
Patient needs refill of amlodipine sent to same [facility] please.

## 2019-11-25 ENCOUNTER — Other Ambulatory Visit: Payer: Self-pay

## 2019-11-25 MED ORDER — AMLODIPINE BESYLATE 5 MG PO TABS: 5.0000 mg | ORAL_TABLET | Freq: Every day | ORAL | 0 refills | Status: DC

## 2019-11-25 NOTE — Telephone Encounter (Signed)
Rx sent to pharmacy requested, today.

## 2019-11-25 NOTE — Telephone Encounter (Signed)
Last OV 10/29/19 Last fill 08/22/19  #90/0

## 2019-12-04 ENCOUNTER — Other Ambulatory Visit: Payer: Self-pay | Admitting: Family Medicine

## 2019-12-04 DIAGNOSIS — G2581 Restless legs syndrome: Secondary | ICD-10-CM

## 2019-12-04 NOTE — Telephone Encounter (Signed)
Last OV 10/29/19 Last fill 12/26/18  #90/3

## 2019-12-08 ENCOUNTER — Other Ambulatory Visit: Payer: Self-pay

## 2019-12-08 ENCOUNTER — Ambulatory Visit (INDEPENDENT_AMBULATORY_CARE_PROVIDER_SITE_OTHER): Payer: Medicare Other | Admitting: Family Medicine

## 2019-12-08 ENCOUNTER — Encounter: Payer: Self-pay | Admitting: Family Medicine

## 2019-12-08 VITALS — BP 118/68 | HR 96 | Temp 96.0°F | Ht 63.0 in | Wt 118.6 lb

## 2019-12-08 DIAGNOSIS — H8309 Labyrinthitis, unspecified ear: Secondary | ICD-10-CM | POA: Diagnosis not present

## 2019-12-08 DIAGNOSIS — R519 Headache, unspecified: Secondary | ICD-10-CM

## 2019-12-08 DIAGNOSIS — G2581 Restless legs syndrome: Secondary | ICD-10-CM | POA: Diagnosis not present

## 2019-12-08 DIAGNOSIS — G8929 Other chronic pain: Secondary | ICD-10-CM | POA: Diagnosis not present

## 2019-12-08 MED ORDER — MECLIZINE HCL 25 MG PO TABS: 25.0000 mg | ORAL_TABLET | Freq: Three times a day (TID) | ORAL | 0 refills | Status: DC | PRN

## 2019-12-08 MED ORDER — PRAMIPEXOLE DIHYDROCHLORIDE 0.5 MG PO TABS: ORAL_TABLET | ORAL | 2 refills | Status: DC

## 2019-12-08 NOTE — Progress Notes (Signed)
Established Patient Office Visit  Subjective:  Patient ID: Ana Santos, female    DOB: [birth year]  Age: 79 y.o. MRN: [id number]  CC:  Chief Complaint  Patient presents with  . Fall    Patient had a fall 2 weeks ago, last weekpatient started to feel very dizzy and off balance states that her legs seem to be jerking a lot more mostly at night.      HPI Ana Santos presents for follow-up status post recent fall.  She was standing on a chair on her front porch watering flowers when the chair hit a groove in the porch and she fell backwards and landed on the right backside of her head.  There was no loss of consciousness.  She does complain of lightheadedness since the the fall.  If she moves her head suddenly there is a slight spinning sensation.  Ongoing history of chronic headache status post cerebral artery stent placement some years ago.  Still having problems with RLS on the point to 5 dose Mirapex.  She is accompanied by her husband today and I advised him to be there for all future appointments.  Past Medical History:  Diagnosis Date  . AN (acoustic neuroma) ([place])   . Aneurysm ([place])   . Anxiety   . Cataract   . Cerebrovascular disease   . Cigarette smoker   . COPD (chronic obstructive pulmonary disease) ([facility])    mild, no inhalers or oxygen used  . Coronary artery disease    no current cardiologist  pt. denies  . Deafness in right ear   . Depression   . Headache    tension headaches due to aneurysm repair with stent  . Heart murmur   . History of kidney stones    15 to 20 yrs ago  . Hyperlipidemia   . Hypothyroidism   . IBS (irritable bowel syndrome)   . Mitral valve prolapse    mild takes beta blocker  . Osteopenia   . Palpitations   . Stroke ([place])    x 3 ministrokes last one feb 2014  . Venous insufficiency   . Vitamin D deficiency     Past Surgical History:  Procedure Laterality Date  . ANEURYSM COILING  11-10   Dr. Estanislado Pandy  . BREAST LUMPECTOMY  Right 1986   benign  . DIAGNOSTIC LAPAROSCOPY     Proable laparoscopic right colectomy 03-14-18 Dr. Excell Seltzer  . EYE SURGERY Bilateral    ioc lens for cataracts  . LAPAROSCOPIC RIGHT COLECTOMY Right 03/14/2018   Procedure: LAPAROSCOPIC ASSISTED  RIGHT COLECTOMY;  Surgeon: Excell Seltzer, MD;  [facility];  Service: General;  Laterality: Right;  . LAPAROSCOPY N/A 03/14/2018   Procedure: LAPAROSCOPY;  Surgeon: Excell Seltzer, MD;  [facility];  Service: General;  Laterality: N/A;  . LEFT HEART CATHETERIZATION WITH CORONARY ANGIOGRAM N/A 08/05/2014   Procedure: LEFT HEART CATHETERIZATION WITH CORONARY ANGIOGRAM;  Surgeon: Wellington Hampshire, MD;  Location: [facility];  Service: Cardiovascular;  Laterality: N/A;  . TONSILLECTOMY    . TOTAL ABDOMINAL HYSTERECTOMY  1970   complete  . TRANSLABYRINTHINE PROCEDURE  2002   [facility] Dr. Vicie Mutters tumor removal  . TRANSURETHRAL RESECTION OF BLADDER TUMOR N/A 02/08/2018   Procedure: TRANSURETHRAL RESECTION OF BLADDER TUMOR (TURBT)/ POSTOPERATIVE INSTILLATION OF CHEMO THERAPY;  Surgeon: Festus Aloe, MD;  Location: [facility];  Service: Urology;  Laterality: N/A;    Family History  Problem Relation Age of Onset  . Heart  attack Mother   . Diabetes Mother   . Cancer Father   . Diabetes Sister   . Diabetes Brother   . Cancer Sister        unkown type  . Lung cancer Brother        Lung  . AAA (abdominal aortic aneurysm) Neg Hx   . Colon cancer Neg Hx   . Stomach cancer Neg Hx   . Esophageal cancer Neg Hx   . Liver cancer Neg Hx   . Pancreatic cancer Neg Hx   . Rectal cancer Neg Hx     Social History   Socioeconomic History  . Marital status: Married    Spouse name: Not on file  . Number of children: Not on file  . Years of education: Not on file  . Highest education level: Not on file  Occupational History  . Occupation: alterations  Tobacco Use  . Smoking status: Current Every Day Smoker    Packs/day: 0.25      Years: 50.00    Pack years: 12.50    Types: Cigarettes  . Smokeless tobacco: Never Used  . Tobacco comment: 4-5 cigs daily  is aware she needs to quit  Vaping Use  . Vaping Use: Never used  Substance and Sexual Activity  . Alcohol use: No    Alcohol/week: 0.0 standard drinks  . Drug use: No  . Sexual activity: Not Currently  Other Topics Concern  . Not on file  Social History Narrative   Pt is married, 5 sons (4 living), 14 grandchildren, 4 great grandchildren.   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 Difficulty of Paying Living Expenses:   Food Insecurity:   . Worried About Charity fundraiser in the Last Year:   . Arboriculturist in the Last Year:   Transportation Needs:   . Film/video editor (Medical):   Marland Kitchen Lack of Transportation (Non-Medical):   Physical Activity:   . Days of Exercise per Week:   . Minutes of Exercise per Session:   Stress:   . Feeling of Stress :   Social Connections:   . Frequency of Communication with Friends and Family:   . Frequency of Social Gatherings with Friends and Family:   . Attends Religious Services:   . Active Member of Clubs or Organizations:   . Attends Archivist Meetings:   Marland Kitchen Marital Status:   Intimate Partner Violence:   . Fear of Current or Ex-Partner:   . Emotionally Abused:   Marland Kitchen Physically Abused:   . Sexually Abused:     Outpatient Medications Prior to Visit  Medication Sig Dispense Refill  . acetaminophen (TYLENOL) 500 MG tablet Take 500 mg by mouth every 6 (six) hours as needed for mild pain.     Marland Kitchen amLODipine (NORVASC) 5 MG tablet Take 1 tablet (5 mg total) by mouth daily. 90 tablet 0  . cholecalciferol (VITAMIN D) 1000 UNITS tablet Take 1,000 Units by mouth daily.      . clopidogrel (PLAVIX) 75 MG tablet TAKE 1 TABLET DAILY 90 tablet 3  . levothyroxine (SYNTHROID) 75 MCG tablet Take 1 tablet (75 mcg  total) by mouth daily before breakfast. 90 tablet 2  . potassium chloride (K-DUR) 10 MEQ tablet Take 2 tabs daily 60 tablet 0  . pravastatin (PRAVACHOL) 40 MG tablet Take 1 tablet (40 mg total)  by mouth daily. 90 tablet 3  . traMADol (ULTRAM) 50 MG tablet Take 1 tablet (50 mg total) by mouth every 12 (twelve) hours as needed for severe pain. 60 tablet 2  . pramipexole (MIRAPEX) 0.25 MG tablet TAKE 1 TABLET AT BEDTIME FOR RESTLESS LEG SYMPTOMS 90 tablet 3  . diclofenac Sodium (VOLTAREN) 1 % GEL  (Patient not taking: Reported on 12/08/2019)    . hydroxypropyl methylcellulose / hypromellose (ISOPTO TEARS / GONIOVISC) 2.5 % ophthalmic solution Place 1 drop into both eyes 2 (two) times daily.  (Patient not taking: Reported on 12/08/2019)    . metoprolol tartrate (LOPRESSOR) 25 MG tablet TAKE 1 TABLET TWICE A DAY (Patient not taking: Reported on 12/08/2019) 180 tablet 2  . metoprolol tartrate (LOPRESSOR) 25 MG tablet by mouth (Patient not taking: Reported on 12/08/2019)     No facility-administered medications prior to visit.    Allergies  Allergen Reactions  . Atorvastatin Other (See Comments)     Lipitor caused arm pain (3/09)  . Hydrocodone-Acetaminophen Other (See Comments)    hallucinations  . Morphine Other (See Comments)    hallucinations  . Nortriptyline     Caused nausea and vomiting and shaking, kept her up all night  . Topamax [Topiramate] Nausea And Vomiting    *dizziness*    ROS Review of Systems  Constitutional: Negative.   HENT: Negative.   Respiratory: Negative.   Cardiovascular: Negative.   Gastrointestinal: Negative.   Genitourinary: Negative.   Musculoskeletal: Positive for arthralgias, gait problem and myalgias.  Skin: Negative for pallor and rash.  Allergic/Immunologic: Negative for immunocompromised state.  Neurological: Positive for dizziness, light-headedness and headaches.  Hematological: Does not bruise/bleed easily.      Objective:    Physical  Exam Vitals and nursing note reviewed.  Constitutional:      General: She is not in acute distress.    Appearance: Normal appearance. She is normal weight. She is not toxic-appearing.  HENT:     Head: Normocephalic.     Right Ear: External ear normal.     Left Ear: External ear normal.  Eyes:     General: No scleral icterus.       Right eye: No discharge.        Left eye: No discharge.     Extraocular Movements: Extraocular movements intact.     Conjunctiva/sclera: Conjunctivae normal.     Pupils: Pupils are equal, round, and reactive to light.  Cardiovascular:     Rate and Rhythm: Normal rate and regular rhythm.  Pulmonary:     Effort: Pulmonary effort is normal.     Breath sounds: Normal breath sounds.  Skin:    General: Skin is warm and dry.       Neurological:     Mental Status: She is alert and oriented to person, place, and time.  Psychiatric:        Mood and Affect: Mood normal.        Behavior: Behavior normal.     BP 118/68   Pulse 96   Temp (!) 96 F (35.6 C) (Tympanic)   Ht 5\' 3"  (1.6 m)   Wt 118 lb 9.6 oz (53.8 kg)   SpO2 97%   BMI 21.01 kg/m  Wt Readings from Last 3 Encounters:  12/08/19 118 lb 9.6 oz (53.8 kg)  10/29/19 121 lb 6.4 oz (55.1 kg)  07/04/19 124 lb 6.4 oz (56.4 kg)     Health Maintenance Due  Topic Date Due  .  Hepatitis C Screening  Never Santos  . COVID-19 Vaccine (1) Never Santos    There are no preventive care reminders to display for this patient.  Lab Results  Component Value Date   TSH 1.78 10/29/2019   Lab Results  Component Value Date   WBC 5.5 10/29/2019   HGB 14.2 10/29/2019   HCT 43.0 10/29/2019   MCV 91.5 10/29/2019   PLT 217.0 10/29/2019   Lab Results  Component Value Date   NA 139 10/29/2019   K 4.2 10/29/2019   CO2 26 10/29/2019   GLUCOSE 89 10/29/2019   BUN 16 10/29/2019   CREATININE 0.71 10/29/2019   BILITOT 0.3 05/22/2018   ALKPHOS 90 05/22/2018   AST 12 05/22/2018   ALT 5 05/22/2018   PROT 7.4  05/22/2018   ALBUMIN 4.0 05/22/2018   CALCIUM 9.0 10/29/2019   ANIONGAP 6 03/23/2018   GFR 79.45 10/29/2019   Lab Results  Component Value Date   CHOL 180 10/29/2019   Lab Results  Component Value Date   HDL 63.50 10/29/2019   Lab Results  Component Value Date   LDLCALC 104 (H) 10/29/2019   Lab Results  Component Value Date   TRIG 63.0 10/29/2019   Lab Results  Component Value Date   CHOLHDL 3 10/29/2019   No results found for: HGBA1C    Assessment & Plan:   Problem List Items Addressed This Visit      Nervous and Auditory   Labyrinthitis   Relevant Medications   meclizine (ANTIVERT) 25 MG tablet     Other   Chronic nonintractable headache - Primary   Relevant Orders   Ambulatory referral to Neurology    Other Visit Diagnoses    RLS (restless legs syndrome)       Relevant Medications   pramipexole (MIRAPEX) 0.5 MG tablet      Meds ordered this encounter  Medications  . meclizine (ANTIVERT) 25 MG tablet    Sig: Take 1 tablet (25 mg total) by mouth 3 (three) times daily as needed for dizziness.    Dispense:  30 tablet    Refill:  0  . pramipexole (MIRAPEX) 0.5 MG tablet    Sig: Take one nightly one hour prior to going to bed as needed for restless leg syndrome.    Dispense:  30 tablet    Refill:  2    Follow-up: Return in about 3 months (around 03/09/2020), or drowsy precautions with meclizine..   Have increased Mirapex to .5mg  to be taken 1 hour before bedtime.  Advised her not to climb on ladders or chairs ever again. Libby Maw, MD

## 2019-12-09 ENCOUNTER — Encounter: Payer: Self-pay | Admitting: Neurology

## 2019-12-18 ENCOUNTER — Telehealth: Payer: Self-pay | Admitting: Family Medicine

## 2019-12-18 NOTE — Telephone Encounter (Signed)
Patient called and stated that Dr. Ethelene Hal put in a referral for her to see a neurologist and they can't get her in until September. Patient wanted to see if there was anyway she can go somewhere else or if it can be put in as an urgent appointment, please advise. CB is [phone]

## 2019-12-18 NOTE — Telephone Encounter (Signed)
Sent to [facility] to see if sooner appt is available - notified pt via phone call

## 2019-12-22 ENCOUNTER — Telehealth: Payer: Self-pay | Admitting: Family Medicine

## 2019-12-22 ENCOUNTER — Other Ambulatory Visit: Payer: Self-pay | Admitting: Family Medicine

## 2019-12-22 DIAGNOSIS — G44229 Chronic tension-type headache, not intractable: Secondary | ICD-10-CM

## 2019-12-22 DIAGNOSIS — E782 Mixed hyperlipidemia: Secondary | ICD-10-CM

## 2019-12-22 NOTE — Telephone Encounter (Signed)
Patient calling states that she would like some different called in for her headaches she have been taking Tramadol it was refill and should be delivered within the next 3 days. Per patient she is currently having withdrawal from the Tramadol and it's making her feel terrible. Can something else be sent in for headaches? Please advise

## 2019-12-22 NOTE — Telephone Encounter (Signed)
Patient's husband called for update on getting new pain medication prescription.

## 2019-12-22 NOTE — Telephone Encounter (Signed)
Patient states that she is having withdrawals from Tramadol. She said that she has been without it for almost 5 days and she's not feeling well. Please give her a call back at [phone].

## 2019-12-23 ENCOUNTER — Other Ambulatory Visit: Payer: Self-pay

## 2019-12-23 ENCOUNTER — Telehealth: Payer: Self-pay | Admitting: Family Medicine

## 2019-12-23 DIAGNOSIS — G44229 Chronic tension-type headache, not intractable: Secondary | ICD-10-CM

## 2019-12-23 MED ORDER — METHOCARBAMOL 500 MG PO TABS: ORAL_TABLET | ORAL | 1 refills | Status: DC

## 2019-12-23 NOTE — Telephone Encounter (Signed)
Spoke with patient who verbally understood medication [facility] at [facility] and sent to [facility]

## 2019-12-23 NOTE — Telephone Encounter (Signed)
Patient is calling and stated that Dr. Ethelene Hal sent Robaxin to [facility] and wanted to see if it can be sent to [facility] in [place] on [address] so patient can get medication sooner, please advise. CB is [phone]

## 2019-12-23 NOTE — Telephone Encounter (Signed)
Called to inform patient that Rx was sent in no answer LMTCB

## 2019-12-30 ENCOUNTER — Telehealth: Payer: Self-pay | Admitting: Family Medicine

## 2019-12-30 NOTE — Telephone Encounter (Signed)
Please see message and advise.  Thank you. ° °

## 2019-12-30 NOTE — Telephone Encounter (Signed)
Patient is calling and stated that she is experiencing some anxiety and is not sleeping. Patient asked if Dr. Ethelene Hal can call her something in for anxiety, please advise. CB is [phone]

## 2020-01-06 DIAGNOSIS — N3000 Acute cystitis without hematuria: Secondary | ICD-10-CM | POA: Diagnosis not present

## 2020-01-06 DIAGNOSIS — M545 Low back pain: Secondary | ICD-10-CM | POA: Diagnosis not present

## 2020-01-06 DIAGNOSIS — N390 Urinary tract infection, site not specified: Secondary | ICD-10-CM | POA: Diagnosis not present

## 2020-01-13 DIAGNOSIS — R350 Frequency of micturition: Secondary | ICD-10-CM | POA: Diagnosis not present

## 2020-01-13 DIAGNOSIS — R1084 Generalized abdominal pain: Secondary | ICD-10-CM | POA: Diagnosis not present

## 2020-01-13 DIAGNOSIS — N139 Obstructive and reflux uropathy, unspecified: Secondary | ICD-10-CM | POA: Diagnosis not present

## 2020-01-14 DIAGNOSIS — N139 Obstructive and reflux uropathy, unspecified: Secondary | ICD-10-CM | POA: Diagnosis not present

## 2020-01-28 DIAGNOSIS — C679 Malignant neoplasm of bladder, unspecified: Secondary | ICD-10-CM | POA: Diagnosis not present

## 2020-01-28 DIAGNOSIS — R1084 Generalized abdominal pain: Secondary | ICD-10-CM | POA: Diagnosis not present

## 2020-01-28 NOTE — Progress Notes (Signed)
Subjective:   Ana Santos is a 79 y.o. female who presents for an Initial Medicare Annual Wellness Visit.  I connected with Ana Santos today by telephone and verified that I am speaking with the correct person using two identifiers. Location patient: home Location provider: work Persons participating in the virtual visit: patient, Ana Santos.    I discussed the limitations, risks, security and privacy concerns of performing an evaluation and management service by telephone and the availability of in person appointments. I also discussed with the patient that there may be a patient responsible charge related to this service. The patient expressed understanding and verbally consented to this telephonic visit.    Interactive audio and video telecommunications were attempted between this provider and patient, however failed, due to patient having technical difficulties OR patient did not have access to video capability.  We continued and completed visit with audio only.  Some vital signs may be absent or patient reported.   Time Spent with patient on telephone encounter: 30 minutes  Review of Systems     Cardiac Risk Factors include: advanced age (>68men, >71 women);dyslipidemia;hypertension;sedentary lifestyle;smoking/ tobacco exposure     Objective:    Today's Vitals   01/29/20 1414  Weight: 118 lb (53.5 kg)  Height: 5\' 3"  (1.6 m)   Body mass index is 20.9 kg/m.  Advanced Directives 01/29/2020 03/22/2018 03/14/2018 03/13/2018 02/08/2018 12/17/2015 02/22/2015  Does Patient Have a Medical Advance Directive? No No No No No No No  Would patient like information on creating a medical advance directive? Yes (Ana Santos) No - Patient declined No - Patient declined No - Patient declined No - Patient declined No - patient declined information -    Current Medications (verified) Outpatient Encounter Medications as of 01/29/2020  Medication Sig  .  acetaminophen (TYLENOL) 500 MG tablet Take 500 mg by mouth every 6 (six) hours as needed for mild pain.   Marland Kitchen amLODipine (NORVASC) 5 MG tablet Take 1 tablet (5 mg total) by mouth daily.  . cholecalciferol (VITAMIN D) 1000 UNITS tablet Take 1,000 Units by mouth daily.    . clopidogrel (PLAVIX) 75 MG tablet TAKE 1 TABLET DAILY  . hydroxypropyl methylcellulose / hypromellose (ISOPTO TEARS / GONIOVISC) 2.5 % ophthalmic solution Place 1 drop into both eyes 2 (two) times daily.   Marland Kitchen levothyroxine (SYNTHROID) 75 MCG tablet Take 1 tablet (75 mcg total) by mouth daily before breakfast.  . methocarbamol (ROBAXIN) 500 MG tablet May take one every 8 hours as needed for headache.  . pramipexole (MIRAPEX) 0.5 MG tablet Take one nightly one hour prior to going to bed as needed for restless leg syndrome.  . pravastatin (PRAVACHOL) 40 MG tablet TAKE 1 TABLET DAILY  . diclofenac Sodium (VOLTAREN) 1 % GEL  (Patient not taking: Reported on 12/08/2019)  . meclizine (ANTIVERT) 25 MG tablet Take 1 tablet (25 mg total) by mouth 3 (three) times daily as needed for dizziness. (Patient not taking: Reported on 01/29/2020)  . metoprolol tartrate (LOPRESSOR) 25 MG tablet TAKE 1 TABLET TWICE A DAY (Patient not taking: Reported on 12/08/2019)  . metoprolol tartrate (LOPRESSOR) 25 MG tablet by mouth (Patient not taking: Reported on 12/08/2019)  . potassium chloride (K-DUR) 10 MEQ tablet Take 2 tabs daily (Patient not taking: Reported on 01/29/2020)  . traMADol (ULTRAM) 50 MG tablet Take 1 tablet (50 mg total) by mouth every 12 (twelve) hours as needed for severe pain. (Patient not taking: Reported on 01/29/2020)   No facility-administered  encounter medications on file as of 01/29/2020.    Allergies (verified) Atorvastatin, Hydrocodone-acetaminophen, Morphine, Nortriptyline, and Topamax [topiramate]   History: Past Medical History:  Diagnosis Date  . AN (acoustic neuroma) ([place])   . Aneurysm ([place])   . Anxiety   . Cataract   .  Cerebrovascular disease   . Cigarette smoker   . COPD (chronic obstructive pulmonary disease) ([facility])    mild, no inhalers or oxygen used  . Coronary artery disease    no current cardiologist  pt. denies  . Deafness in right ear   . Depression   . Headache    tension headaches due to aneurysm repair with stent  . Heart murmur   . History of kidney stones    15 to 20 yrs ago  . Hyperlipidemia   . Hypothyroidism   . IBS (irritable bowel syndrome)   . Mitral valve prolapse    mild takes beta blocker  . Osteopenia   . Palpitations   . Stroke ([place])    x 3 ministrokes last one feb 2014  . Venous insufficiency   . Vitamin D deficiency    Past Surgical History:  Procedure Laterality Date  . ANEURYSM COILING  11-10   Ana Santos  . BREAST LUMPECTOMY Right 1986   benign  . DIAGNOSTIC LAPAROSCOPY     Proable laparoscopic right colectomy 03-14-18 Dr. Excell Santos  . EYE SURGERY Bilateral    ioc lens for cataracts  . LAPAROSCOPIC RIGHT COLECTOMY Right 03/14/2018   Procedure: LAPAROSCOPIC ASSISTED  RIGHT COLECTOMY;  Surgeon: Ana Seltzer, MD;  Location: [facility];  Service: General;  Laterality: Right;  . LAPAROSCOPY N/A 03/14/2018   Procedure: LAPAROSCOPY;  Surgeon: Ana Seltzer, MD;  Location: [facility];  Service: General;  Laterality: N/A;  . LEFT HEART CATHETERIZATION WITH CORONARY ANGIOGRAM N/A 08/05/2014   Procedure: LEFT HEART CATHETERIZATION WITH CORONARY ANGIOGRAM;  Surgeon: Ana Hampshire, MD;  Location: [facility];  Service: Cardiovascular;  Laterality: N/A;  . TONSILLECTOMY    . TOTAL ABDOMINAL HYSTERECTOMY  1970   complete  . TRANSLABYRINTHINE PROCEDURE  2002   [facility] Ana Santos tumor removal  . TRANSURETHRAL RESECTION OF BLADDER TUMOR N/A 02/08/2018   Procedure: TRANSURETHRAL RESECTION OF BLADDER TUMOR (TURBT)/ POSTOPERATIVE INSTILLATION OF CHEMO THERAPY;  Surgeon: Ana Aloe, MD;  Location: [facility];  Service: Urology;  Laterality: N/A;   Family  History  Problem Relation Age of Onset  . Heart attack Mother   . Diabetes Mother   . Cancer Father   . Diabetes Sister   . Diabetes Brother   . Cancer Sister        unkown type  . Lung cancer Brother        Lung  . AAA (abdominal aortic aneurysm) Neg Hx   . Colon cancer Neg Hx   . Stomach cancer Neg Hx   . Esophageal cancer Neg Hx   . Liver cancer Neg Hx   . Pancreatic cancer Neg Hx   . Rectal cancer Neg Hx    Social History   Socioeconomic History  . Marital status: Married    Spouse name: Not on file  . Number of children: Not on file  . Years of education: Not on file  . Highest education level: Not on file  Occupational History  . Occupation: alterations  Tobacco Use  . Smoking status: Current Every Day Smoker    Packs/day: 0.25    Years: 50.00    Pack years: 12.50  Types: Cigarettes  . Smokeless tobacco: Never Used  . Tobacco comment: 4-5 cigs daily  is aware she needs to quit  Vaping Use  . Vaping Use: Never used  Substance and Sexual Activity  . Alcohol use: No    Alcohol/week: 0.0 standard drinks  . Drug use: No  . Sexual activity: Not Currently  Other Topics Concern  . Not on file  Social History Narrative   Pt is married, 5 sons (4 living), 14 grandchildren, 4 great grandchildren.   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 Difficulty of Paying Living Expenses: Not hard at all  Food Insecurity: No Food Insecurity  . Worried About Charity fundraiser in the Last Year: Never true  . Ran Out of Food in the Last Year: Never true  Transportation Needs: No Transportation Needs  . Lack of Transportation (Medical): No  . Lack of Transportation (Non-Medical): No  Physical Activity: Inactive  . Days of Exercise per Week: 0 days  . Minutes of Exercise per Session: 0 min  Stress: No Stress Concern Present  . Feeling of Stress : Not at all    Social Connections: Moderately Isolated  . Frequency of Communication with Friends and Family: More than three times a week  . Frequency of Social Gatherings with Friends and Family: Once a week  . Attends Religious Services: Never  . Active Member of Clubs or Organizations: No  . Attends Archivist Meetings: Never  . Marital Status: Married    Tobacco Counseling Ready to quit: Not Answered Counseling Santos: Not Answered Comment: 4-5 cigs daily  is aware she needs to quit   Clinical Intake:  Pre-visit preparation completed: Yes  Pain : 0-10 Pain Type: Acute pain Pain Location: Back Pain Onset: More than a month ago Pain Frequency: Constant     Nutritional Status: BMI of 19-24  Normal Nutritional Risks: None Diabetes: No  How often do you need to have someone help you when you read instructions, pamphlets, or other written materials from your doctor or pharmacy?: 1 - Never What is the last grade level you completed in school?: 9th  grade  Diabetic? No  Interpreter Needed?: No  Information entered by :: Caroleen Hamman LPN   Activities of Daily Living In your present state of health, do you have any difficulty performing the following activities: 01/29/2020  Hearing? Y  Vision? N  Difficulty concentrating or making decisions? N  Walking or climbing stairs? N  Dressing or bathing? N  Doing errands, shopping? N  Preparing Food and eating ? N  Using the Toilet? N  In the past six months, have you accidently leaked urine? N  Do you have problems with loss of bowel control? N  Managing your Medications? N  Managing your Finances? N  Housekeeping or managing your Housekeeping? N  Some recent data might be hidden    Patient Care Team: Libby Maw, MD as PCP - General (Family Medicine)  Indicate any recent Medical Services you may have received from other than [facility] providers in the past year (date may be approximate).     Assessment:   This  is a routine wellness examination for [place].  Hearing/Vision screen  Hearing Screening   125Hz  250Hz  500Hz  1000Hz  2000Hz  3000Hz  4000Hz  6000Hz  8000Hz   Right ear:           Left  ear:           Comments: Hearing loss in right ear  Vision Screening Comments: Cataract surgery Last eye exam-08/2019-Dr. Groat  Dietary issues and exercise activities discussed: Current Exercise Habits: The patient does not participate in regular exercise at present, Exercise limited by: None identified  Goals    . Patient Stated     Maintain current health      Depression Screen PHQ 2/9 Scores 01/29/2020 10/29/2019 07/04/2019 10/20/2015 01/01/2013  PHQ - 2 Score 0 0 0 1 0  PHQ- 9 Score - 3 - - -    Fall Risk Fall Risk  01/29/2020 12/08/2019 10/29/2019 07/04/2019 05/07/2019  Falls in the past year? 1 0 0 0 0  Comment - - - - Emmi Telephone Survey: data to providers prior to load  Number falls in past yr: 0 1 - - -  Injury with Fall? 1 (No Data) - - -  Comment - hit head but did not feel she needed to go to ED - - -  Risk for fall due to : - - - - -  Follow up Falls prevention discussed - - - -    Any stairs in or around the home? No   Home free of loose throw rugs in walkways, pet beds, electrical cords, etc? Yes  Adequate lighting in your home to reduce risk of falls? Yes   ASSISTIVE DEVICES UTILIZED TO PREVENT FALLS:  Life alert? No  Use of a cane, walker or w/c? No  Grab bars in the bathroom? Yes  Shower chair or bench in shower? No  Elevated toilet seat or a handicapped toilet? No   TIMED UP AND GO:  Was the test performed? No .phone visit    Cognitive Function: No cognitive impairment noted. Plays games on her computer for brain health.        Immunizations Immunization History  Administered Date(s) Administered  . Fluad Quad(high Dose 65+) 03/04/2019  . Influenza Split 06/09/2011, 03/29/2012, 03/12/2018  . Influenza Whole 03/12/2009, 03/29/2010  . Influenza, High Dose Seasonal PF  03/17/2016, 04/25/2017  . Influenza,inj,Quad PF,6+ Mos 05/22/2013, 04/17/2014, 04/22/2015  . Influenza-Unspecified 04/12/2018  . PFIZER SARS-COV-2 Vaccination 06/28/2019, 07/19/2019  . Pneumococcal Conjugate-13 10/23/2016  . Pneumococcal Polysaccharide-23 12/18/2007    TDAP status: Up to date  Patient states she had a TDAP 6 years ago  Flu Vaccine status: Up to date Due 02/2020  Pneumococcal vaccine status: Up to date   Covid-19 vaccine status: Completed vaccines  Qualifies for Shingles Vaccine? Yes   Zostavax completed No   Shingrix Completed?: No.    Education has been provided regarding the importance of this vaccine. Patient has been advised to call insurance company to determine out of pocket expense if they have not yet received this vaccine. Advised may also receive vaccine at local pharmacy or Health Dept. Verbalized acceptance and understanding.  Screening Tests Health Maintenance  Topic Date Due  . Hepatitis C Screening  Never done  . INFLUENZA VACCINE  01/11/2020  . TETANUS/TDAP  10/28/2020 (Originally 01/04/1960)  . DEXA SCAN  Completed  . COVID-19 Vaccine  Completed  . PNA vac Low Risk Adult  Completed    Health Maintenance  Health Maintenance Due  Topic Date Due  . Hepatitis C Screening  Never done  . INFLUENZA VACCINE  01/11/2020    Colorectal cancer screening: No longer required.    Mammogram Status:Ordered today. Patient aware that someone will be calling to schedule  Bone  Density Status: Ordered today. Patient aware that someone will be calling to schedule  Lung Cancer Screening: (Low Dose CT Chest recommended if Age 54-80 years, 30 pack-year currently smoking OR have quit w/in 15years.) does qualify.   Lung Cancer Screening Referral: Ordered today. Patient aware that someone will be calling to schedule  Additional Screening:  Hepatitis C Screening: Discuss with PCP  Vision Screening: Recommended annual ophthalmology exams for early detection of  glaucoma and other disorders of the eye. Is the patient up to date with their annual eye exam?  Yes  Who is the provider or what is the name of the office in which the patient attends annual eye exams? Dr. Katy Fitch   Dental Screening: Recommended annual dental exams for proper oral hygiene  Community Resource Referral / Chronic Care Management: CRR required this visit?  No   CCM required this visit?  No      Plan:     I have personally reviewed and noted the following in the patient's chart:   . Medical and social history . Use of alcohol, tobacco or illicit drugs  . Current medications and supplements . Functional ability and status . Nutritional status . Physical activity . Advanced directives . List of other physicians . Hospitalizations, surgeries, and ER visits in previous 12 months . Vitals . Screenings to include cognitive, depression, and falls . Referrals and appointments  In addition, I have reviewed and discussed with patient certain preventive protocols, quality metrics, and best practice recommendations. A written personalized care plan for preventive services as well as general preventive health recommendations were provided to patient.    Due to this being a telephonic visit, the after visit summary with patients personalized plan was offered to patient via mail or my-chart.Per request,  copy mailed to patient.   Marta Antu, LPN   [id number]  Nurse Health Advisor  Nurse Notes: Patient complains of back pain. Appointment made to see Dr. Ethelene Hal.

## 2020-01-29 ENCOUNTER — Telehealth: Payer: Self-pay

## 2020-01-29 ENCOUNTER — Ambulatory Visit (INDEPENDENT_AMBULATORY_CARE_PROVIDER_SITE_OTHER): Payer: Medicare Other

## 2020-01-29 VITALS — Ht 63.0 in | Wt 118.0 lb

## 2020-01-29 DIAGNOSIS — Z780 Asymptomatic menopausal state: Secondary | ICD-10-CM | POA: Diagnosis not present

## 2020-01-29 DIAGNOSIS — G44229 Chronic tension-type headache, not intractable: Secondary | ICD-10-CM

## 2020-01-29 DIAGNOSIS — Z0000 Encounter for general adult medical examination without abnormal findings: Secondary | ICD-10-CM

## 2020-01-29 DIAGNOSIS — Z122 Encounter for screening for malignant neoplasm of respiratory organs: Secondary | ICD-10-CM | POA: Diagnosis not present

## 2020-01-29 DIAGNOSIS — Z1231 Encounter for screening mammogram for malignant neoplasm of breast: Secondary | ICD-10-CM

## 2020-01-29 MED ORDER — METHOCARBAMOL 500 MG PO TABS: ORAL_TABLET | ORAL | 1 refills | Status: DC

## 2020-01-29 NOTE — Patient Instructions (Signed)
Ms. Ana Santos , Thank you for taking time to complete your Medicare Wellness Visit. I appreciate your ongoing commitment to your health goals. Please review the following plan we discussed and let me know if I can assist you in the future.   Screening recommendations/referrals: Colonoscopy: No longer indicated Mammogram: Ordered today. Someone will be calling you to schedule. Bone Density:  Ordered today. Someone will be calling you to schedule. Recommended yearly ophthalmology/optometry visit for glaucoma screening and checkup Recommended yearly dental visit for hygiene and checkup  Lung Cancer Screening:Ordered today. Someone will be calling you to schedule.  Vaccinations: Influenza vaccine: Due 02/2020 Pneumococcal vaccine: Completed vaccines Tdap vaccine: Per our conversation completed 6 years ago. Due every 10 years. Shingles vaccine: Discuss with pharmacy Covid-19:Completed vaccines  Advanced directives: Information mailed today.  Conditions/risks identified: see problem list  Next appointment: Follow up in one year for your annual wellness visit .   Preventive Care 57 Years and Older, Female Preventive care refers to lifestyle choices and visits with your health care provider that can promote health and wellness. What does preventive care include?  A yearly physical exam. This is also called an annual well check.  Dental exams once or twice a year.  Routine eye exams. Ask your health care provider how often you should have your eyes checked.  Personal lifestyle choices, including:  Daily care of your teeth and gums.  Regular physical activity.  Eating a healthy diet.  Avoiding tobacco and drug use.  Limiting alcohol use.  Practicing safe sex.  Taking low-dose aspirin every day.  Taking vitamin and mineral supplements as recommended by your health care provider. What happens during an annual well check? The services and screenings done by your health care  provider during your annual well check will depend on your age, overall health, lifestyle risk factors, and family history of disease. Counseling  Your health care provider may ask you questions about your:  Alcohol use.  Tobacco use.  Drug use.  Emotional well-being.  Home and relationship well-being.  Sexual activity.  Eating habits.  History of falls.  Memory and ability to understand (cognition).  Work and work Statistician.  Reproductive health. Screening  You may have the following tests or measurements:  Height, weight, and BMI.  Blood pressure.  Lipid and cholesterol levels. These may be checked every 5 years, or more frequently if you are over 94 years old.  Skin check.  Lung cancer screening. You may have this screening every year starting at age 64 if you have a 30-pack-year history of smoking and currently smoke or have quit within the past 15 years.  Fecal occult blood test (FOBT) of the stool. You may have this test every year starting at age 16.  Flexible sigmoidoscopy or colonoscopy. You may have a sigmoidoscopy every 5 years or a colonoscopy every 10 years starting at age 68.  Hepatitis C blood test.  Hepatitis B blood test.  Sexually transmitted disease (STD) testing.  Diabetes screening. This is done by checking your blood sugar (glucose) after you have not eaten for a while (fasting). You may have this done every 1-3 years.  Bone density scan. This is done to screen for osteoporosis. You may have this done starting at age 58.  Mammogram. This may be done every 1-2 years. Talk to your health care provider about how often you should have regular mammograms. Talk with your health care provider about your test results, treatment options, and if necessary, the need  for more tests. Vaccines  Your health care provider may recommend certain vaccines, such as:  Influenza vaccine. This is recommended every year.  Tetanus, diphtheria, and acellular  pertussis (Tdap, Td) vaccine. You may need a Td booster every 10 years.  Zoster vaccine. You may need this after age 13.  Pneumococcal 13-valent conjugate (PCV13) vaccine. One dose is recommended after age 73.  Pneumococcal polysaccharide (PPSV23) vaccine. One dose is recommended after age 22. Talk to your health care provider about which screenings and vaccines you need and how often you need them. This information is not intended to replace advice given to you by your health care provider. Make sure you discuss any questions you have with your health care provider. Document Released: 06/25/2015 Document Revised: 02/16/2016 Document Reviewed: 03/30/2015 Elsevier Interactive Patient Education  2017 [place] Prevention in the Home Falls can cause injuries. They can happen to people of all ages. There are many things you can do to make your home safe and to help prevent falls. What can I do on the outside of my home?  Regularly fix the edges of walkways and driveways and fix any cracks.  Remove anything that might make you trip as you walk through a door, such as a raised step or threshold.  Trim any bushes or trees on the path to your home.  Use bright outdoor lighting.  Clear any walking paths of anything that might make someone trip, such as rocks or tools.  Regularly check to see if handrails are loose or broken. Make sure that both sides of any steps have handrails.  Any raised decks and porches should have guardrails on the edges.  Have any leaves, snow, or ice cleared regularly.  Use sand or salt on walking paths during winter.  Clean up any spills in your garage right away. This includes oil or grease spills. What can I do in the bathroom?  Use night lights.  Install grab bars by the toilet and in the tub and shower. Do not use towel bars as grab bars.  Use non-skid mats or decals in the tub or shower.  If you need to sit down in the shower, use a plastic,  non-slip stool.  Keep the floor dry. Clean up any water that spills on the floor as soon as it happens.  Remove soap buildup in the tub or shower regularly.  Attach bath mats securely with double-sided non-slip rug tape.  Do not have throw rugs and other things on the floor that can make you trip. What can I do in the bedroom?  Use night lights.  Make sure that you have a light by your bed that is easy to reach.  Do not use any sheets or blankets that are too big for your bed. They should not hang down onto the floor.  Have a firm chair that has side arms. You can use this for support while you get dressed.  Do not have throw rugs and other things on the floor that can make you trip. What can I do in the kitchen?  Clean up any spills right away.  Avoid walking on wet floors.  Keep items that you use a lot in easy-to-reach places.  If you need to reach something above you, use a strong step stool that has a grab bar.  Keep electrical cords out of the way.  Do not use floor polish or wax that makes floors slippery. If you must use wax, use  non-skid floor wax.  Do not have throw rugs and other things on the floor that can make you trip. What can I do with my stairs?  Do not leave any items on the stairs.  Make sure that there are handrails on both sides of the stairs and use them. Fix handrails that are broken or loose. Make sure that handrails are as long as the stairways.  Check any carpeting to make sure that it is firmly attached to the stairs. Fix any carpet that is loose or worn.  Avoid having throw rugs at the top or bottom of the stairs. If you do have throw rugs, attach them to the floor with carpet tape.  Make sure that you have a light switch at the top of the stairs and the bottom of the stairs. If you do not have them, ask someone to add them for you. What else can I do to help prevent falls?  Wear shoes that:  Do not have high heels.  Have rubber  bottoms.  Are comfortable and fit you well.  Are closed at the toe. Do not wear sandals.  If you use a stepladder:  Make sure that it is fully opened. Do not climb a closed stepladder.  Make sure that both sides of the stepladder are locked into place.  Ask someone to hold it for you, if possible.  Clearly mark and make sure that you can see:  Any grab bars or handrails.  First and last steps.  Where the edge of each step is.  Use tools that help you move around (mobility aids) if they are needed. These include:  Canes.  Walkers.  Scooters.  Crutches.  Turn on the lights when you go into a dark area. Replace any light bulbs as soon as they burn out.  Set up your furniture so you have a clear path. Avoid moving your furniture around.  If any of your floors are uneven, fix them.  If there are any pets around you, be aware of where they are.  Review your medicines with your doctor. Some medicines can make you feel dizzy. This can increase your chance of falling. Ask your doctor what other things that you can do to help prevent falls. This information is not intended to replace advice given to you by your health care provider. Make sure you discuss any questions you have with your health care provider. Document Released: 03/25/2009 Document Revised: 11/04/2015 Document Reviewed: 07/03/2014 Elsevier Interactive Patient Education  2017 [employer].

## 2020-01-29 NOTE — Addendum Note (Signed)
Addended by: Jon Billings on: 01/29/2020 04:54 PM   Modules accepted: Orders

## 2020-01-29 NOTE — Telephone Encounter (Signed)
Patient called back and states she takes the methocarbamol three times per day.

## 2020-01-29 NOTE — Telephone Encounter (Signed)
Returned patients call to see how often she's been taking her medication due to it being to soon for her to be running out. No answer LMTCB

## 2020-01-29 NOTE — Telephone Encounter (Signed)
[facility] is requesting a  refill of methocarbamol.  MS LPN

## 2020-01-29 NOTE — Telephone Encounter (Signed)
Patient calling for refill on methocarbamol 500mg  last refill 12/23/19 with one refill patient completely out states that she have been taking medication 3 times a day that's why she is out so soon. Please advise

## 2020-02-05 ENCOUNTER — Other Ambulatory Visit: Payer: Self-pay

## 2020-02-06 ENCOUNTER — Ambulatory Visit (INDEPENDENT_AMBULATORY_CARE_PROVIDER_SITE_OTHER): Payer: Medicare Other | Admitting: Family Medicine

## 2020-02-06 ENCOUNTER — Ambulatory Visit (INDEPENDENT_AMBULATORY_CARE_PROVIDER_SITE_OTHER): Payer: Medicare Other

## 2020-02-06 ENCOUNTER — Encounter: Payer: Self-pay | Admitting: Family Medicine

## 2020-02-06 VITALS — BP 124/70 | HR 76 | Temp 97.4°F | Ht 63.0 in | Wt 125.0 lb

## 2020-02-06 DIAGNOSIS — M545 Low back pain, unspecified: Secondary | ICD-10-CM

## 2020-02-06 DIAGNOSIS — M47816 Spondylosis without myelopathy or radiculopathy, lumbar region: Secondary | ICD-10-CM | POA: Diagnosis not present

## 2020-02-06 DIAGNOSIS — E559 Vitamin D deficiency, unspecified: Secondary | ICD-10-CM | POA: Diagnosis not present

## 2020-02-06 DIAGNOSIS — E611 Iron deficiency: Secondary | ICD-10-CM | POA: Diagnosis not present

## 2020-02-06 DIAGNOSIS — I700 Atherosclerosis of aorta: Secondary | ICD-10-CM | POA: Diagnosis not present

## 2020-02-06 DIAGNOSIS — M419 Scoliosis, unspecified: Secondary | ICD-10-CM | POA: Diagnosis not present

## 2020-02-06 DIAGNOSIS — M4316 Spondylolisthesis, lumbar region: Secondary | ICD-10-CM | POA: Diagnosis not present

## 2020-02-06 MED ORDER — GABAPENTIN 100 MG PO CAPS: ORAL_CAPSULE | ORAL | 1 refills | Status: DC

## 2020-02-06 NOTE — Progress Notes (Signed)
Established Patient Office Visit  Subjective:  Patient ID: Ana Santos, female    DOB: [birth year]  Age: 79 y.o. MRN: [id number]  CC:  Chief Complaint  Patient presents with  . Back Pain    back pains x 3 months Dx with UTI few months ago seen urologist cleared by urologist asked to come to [facility] for xray of her back.     HPI Ana Santos presents for follow-up of low back pain.  Has been present for years.  Pain is in the left lower back area.  It is nonradiating.  There is no change in her bowel or bladder function.  There is no numbness or tingling or weakness.  She has been taking a multivitamin with iron.  She continues to take the high-dose vitamin D.  She had decided with her husband's urging to discontinue the Ultram.  She feels as though she went through a withdrawal for over 2 to 3 weeks but is doing better now.  She uses the Robaxin as needed.  As far she can tell it does not make her drowsy  Past Medical History:  Diagnosis Date  . AN (acoustic neuroma) ([place])   . Aneurysm ([place])   . Anxiety   . Cataract   . Cerebrovascular disease   . Cigarette smoker   . COPD (chronic obstructive pulmonary disease) ([facility])    mild, no inhalers or oxygen used  . Coronary artery disease    no current cardiologist  pt. denies  . Deafness in right ear   . Depression   . Headache    tension headaches due to aneurysm repair with stent  . Heart murmur   . History of kidney stones    15 to 20 yrs ago  . Hyperlipidemia   . Hypothyroidism   . IBS (irritable bowel syndrome)   . Mitral valve prolapse    mild takes beta blocker  . Osteopenia   . Palpitations   . Stroke ([place])    x 3 ministrokes last one feb 2014  . Venous insufficiency   . Vitamin D deficiency     Past Surgical History:  Procedure Laterality Date  . ANEURYSM COILING  11-10   Dr. Estanislado Pandy  . BREAST LUMPECTOMY Right 1986   benign  . DIAGNOSTIC LAPAROSCOPY     Proable laparoscopic right colectomy 03-14-18 Dr.  Excell Seltzer  . EYE SURGERY Bilateral    ioc lens for cataracts  . LAPAROSCOPIC RIGHT COLECTOMY Right 03/14/2018   Procedure: LAPAROSCOPIC ASSISTED  RIGHT COLECTOMY;  Surgeon: Excell Seltzer, MD;  Location: [facility];  Service: General;  Laterality: Right;  . LAPAROSCOPY N/A 03/14/2018   Procedure: LAPAROSCOPY;  Surgeon: Excell Seltzer, MD;  Location: [facility];  Service: General;  Laterality: N/A;  . LEFT HEART CATHETERIZATION WITH CORONARY ANGIOGRAM N/A 08/05/2014   Procedure: LEFT HEART CATHETERIZATION WITH CORONARY ANGIOGRAM;  Surgeon: Wellington Hampshire, MD;  Location: [facility];  Service: Cardiovascular;  Laterality: N/A;  . TONSILLECTOMY    . TOTAL ABDOMINAL HYSTERECTOMY  1970   complete  . TRANSLABYRINTHINE PROCEDURE  2002   [facility] Dr. Vicie Mutters tumor removal  . TRANSURETHRAL RESECTION OF BLADDER TUMOR N/A 02/08/2018   Procedure: TRANSURETHRAL RESECTION OF BLADDER TUMOR (TURBT)/ POSTOPERATIVE INSTILLATION OF CHEMO THERAPY;  Surgeon: Festus Aloe, MD;  Location: [facility];  Service: Urology;  Laterality: N/A;    Family History  Problem Relation Age of Onset  . Heart attack Mother   . Diabetes Mother   .  Cancer Father   . Diabetes Sister   . Diabetes Brother   . Cancer Sister        unkown type  . Lung cancer Brother        Lung  . AAA (abdominal aortic aneurysm) Neg Hx   . Colon cancer Neg Hx   . Stomach cancer Neg Hx   . Esophageal cancer Neg Hx   . Liver cancer Neg Hx   . Pancreatic cancer Neg Hx   . Rectal cancer Neg Hx     Social History   Socioeconomic History  . Marital status: Married    Spouse name: Not on file  . Number of children: Not on file  . Years of education: Not on file  . Highest education level: Not on file  Occupational History  . Occupation: alterations  Tobacco Use  . Smoking status: Current Every Day Smoker    Packs/day: 0.25    Years: 50.00    Pack years: 12.50    Types: Cigarettes  . Smokeless tobacco: Never Used  .  Tobacco comment: 4-5 cigs daily  is aware she needs to quit  Vaping Use  . Vaping Use: Never used  Substance and Sexual Activity  . Alcohol use: No    Alcohol/week: 0.0 standard drinks  . Drug use: No  . Sexual activity: Not Currently  Other Topics Concern  . Not on file  Social History Narrative   Pt is married, 5 sons (4 living), 14 grandchildren, 4 great grandchildren.   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 Difficulty of Paying Living Expenses: Not hard at all  Food Insecurity: No Food Insecurity  . Worried About Charity fundraiser in the Last Year: Never true  . Ran Out of Food in the Last Year: Never true  Transportation Needs: No Transportation Needs  . Lack of Transportation (Medical): No  . Lack of Transportation (Non-Medical): No  Physical Activity: Inactive  . Days of Exercise per Week: 0 days  . Minutes of Exercise per Session: 0 min  Stress: No Stress Concern Present  . Feeling of Stress : Not at all  Social Connections: Moderately Isolated  . Frequency of Communication with Friends and Family: More than three times a week  . Frequency of Social Gatherings with Friends and Family: Once a week  . Attends Religious Services: Never  . Active Member of Clubs or Organizations: No  . Attends Archivist Meetings: Never  . Marital Status: Married  Human resources officer Violence: Not At Risk  . Fear of Current or Ex-Partner: No  . Emotionally Abused: No  . Physically Abused: No  . Sexually Abused: No    Outpatient Medications Prior to Visit  Medication Sig Dispense Refill  . acetaminophen (TYLENOL) 500 MG tablet Take 500 mg by mouth every 6 (six) hours as needed for mild pain.     Marland Kitchen amLODipine (NORVASC) 5 MG tablet Take 1 tablet (5 mg total) by mouth daily. 90 tablet 0  . cholecalciferol (VITAMIN D) 1000 UNITS tablet Take 1,000 Units by mouth daily.       . clopidogrel (PLAVIX) 75 MG tablet TAKE 1 TABLET DAILY 90 tablet 3  . hydroxypropyl methylcellulose / hypromellose (ISOPTO TEARS / GONIOVISC) 2.5 % ophthalmic solution Place 1 drop into both eyes 2 (two) times daily.     Marland Kitchen levothyroxine (SYNTHROID) 75  MCG tablet Take 1 tablet (75 mcg total) by mouth daily before breakfast. 90 tablet 2  . methocarbamol (ROBAXIN) 500 MG tablet May take one every 8 hours as needed for headache. 30 tablet 1  . pramipexole (MIRAPEX) 0.5 MG tablet Take one nightly one hour prior to going to bed as needed for restless leg syndrome. 30 tablet 2  . pravastatin (PRAVACHOL) 40 MG tablet TAKE 1 TABLET DAILY 90 tablet 3  . diclofenac Sodium (VOLTAREN) 1 % GEL  (Patient not taking: Reported on 12/08/2019)    . meclizine (ANTIVERT) 25 MG tablet Take 1 tablet (25 mg total) by mouth 3 (three) times daily as needed for dizziness. (Patient not taking: Reported on 01/29/2020) 30 tablet 0  . metoprolol tartrate (LOPRESSOR) 25 MG tablet TAKE 1 TABLET TWICE A DAY (Patient not taking: Reported on 12/08/2019) 180 tablet 2  . metoprolol tartrate (LOPRESSOR) 25 MG tablet by mouth (Patient not taking: Reported on 12/08/2019)    . potassium chloride (K-DUR) 10 MEQ tablet Take 2 tabs daily (Patient not taking: Reported on 01/29/2020) 60 tablet 0  . traMADol (ULTRAM) 50 MG tablet Take 1 tablet (50 mg total) by mouth every 12 (twelve) hours as needed for severe pain. (Patient not taking: Reported on 01/29/2020) 60 tablet 2   No facility-administered medications prior to visit.    Allergies  Allergen Reactions  . Atorvastatin Other (See Comments)     Lipitor caused arm pain (3/09)  . Hydrocodone-Acetaminophen Other (See Comments)    hallucinations  . Morphine Other (See Comments)    hallucinations  . Nortriptyline     Caused nausea and vomiting and shaking, kept her up all night  . Topamax [Topiramate] Nausea And Vomiting    *dizziness*    ROS Review of Systems  Constitutional:  Negative.   Respiratory: Negative.   Cardiovascular: Negative.   Gastrointestinal: Negative.   Genitourinary: Negative for difficulty urinating, dysuria and frequency.  Musculoskeletal: Positive for back pain.  Skin: Negative for pallor and rash.  Neurological: Positive for light-headedness. Negative for weakness and numbness.  Hematological: Does not bruise/bleed easily.  Psychiatric/Behavioral: Negative.       Objective:    Physical Exam Vitals and nursing note reviewed.  Constitutional:      General: She is not in acute distress.    Appearance: Normal appearance. She is normal weight. She is not ill-appearing, toxic-appearing or diaphoretic.  HENT:     Head: Normocephalic and atraumatic.     Right Ear: Tympanic membrane, ear canal and external ear normal.     Left Ear: Tympanic membrane, ear canal and external ear normal.  Eyes:     General: No scleral icterus.       Right eye: No discharge.     Extraocular Movements: Extraocular movements intact.     Conjunctiva/sclera: Conjunctivae normal.     Pupils: Pupils are equal, round, and reactive to light.  Cardiovascular:     Rate and Rhythm: Normal rate and regular rhythm.  Pulmonary:     Effort: Pulmonary effort is normal.     Breath sounds: Normal breath sounds.  Musculoskeletal:     Lumbar back: No swelling, deformity, lacerations, spasms or tenderness. Normal range of motion. Negative right straight leg raise test and negative left straight leg raise test.  Skin:    General: Skin is warm and dry.  Neurological:     Mental Status: She is alert and oriented to person, place, and time.     Motor: No  weakness.     Coordination: Romberg sign negative (a little wobly).  Psychiatric:        Mood and Affect: Mood normal.     BP 124/70   Pulse 76   Temp (!) 97.4 F (36.3 C) (Tympanic)   Ht 5\' 3"  (1.6 m)   Wt 125 lb (56.7 kg)   SpO2 99%   BMI 22.14 kg/m  Wt Readings from Last 3 Encounters:  02/06/20 125 lb (56.7 kg)   01/29/20 118 lb (53.5 kg)  12/08/19 118 lb 9.6 oz (53.8 kg)     Health Maintenance Due  Topic Date Due  . Hepatitis C Screening  Never done  . INFLUENZA VACCINE  01/11/2020    There are no preventive care reminders to display for this patient.  Lab Results  Component Value Date   TSH 1.78 10/29/2019   Lab Results  Component Value Date   WBC 5.5 10/29/2019   HGB 14.2 10/29/2019   HCT 43.0 10/29/2019   MCV 91.5 10/29/2019   PLT 217.0 10/29/2019   Lab Results  Component Value Date   NA 139 10/29/2019   K 4.2 10/29/2019   CO2 26 10/29/2019   GLUCOSE 89 10/29/2019   BUN 16 10/29/2019   CREATININE 0.71 10/29/2019   BILITOT 0.3 05/22/2018   ALKPHOS 90 05/22/2018   AST 12 05/22/2018   ALT 5 05/22/2018   PROT 7.4 05/22/2018   ALBUMIN 4.0 05/22/2018   CALCIUM 9.0 10/29/2019   ANIONGAP 6 03/23/2018   GFR 79.45 10/29/2019   Lab Results  Component Value Date   CHOL 180 10/29/2019   Lab Results  Component Value Date   HDL 63.50 10/29/2019   Lab Results  Component Value Date   LDLCALC 104 (H) 10/29/2019   Lab Results  Component Value Date   TRIG 63.0 10/29/2019   Lab Results  Component Value Date   CHOLHDL 3 10/29/2019   No results found for: HGBA1C    Assessment & Plan:   Problem List Items Addressed This Visit      Other   Vitamin D deficiency   Left-sided low back pain without sciatica - Primary   Relevant Medications   gabapentin (NEURONTIN) 100 MG capsule   Other Relevant Orders   DG Lumbar Spine Complete   Iron deficiency      Meds ordered this encounter  Medications  . gabapentin (NEURONTIN) 100 MG capsule    Sig: Take one at night for one week and then twice daily as tolerated.    Dispense:  60 capsule    Refill:  1    Follow-up: Return in about 3 weeks (around 02/27/2020).  Will try low-dose Neurontin and increase slowly.  She will continue multivitamin with iron and high-dose vitamin D.  Libby Maw, MD

## 2020-02-13 ENCOUNTER — Telehealth: Payer: Self-pay | Admitting: Family Medicine

## 2020-02-13 NOTE — Telephone Encounter (Signed)
Returned patients call spoke with husband (assuming) he wanted to know what the xray showed states that patient was okay with results being reported to him since she was unavailable. Informed that xray showed some arthritis.

## 2020-02-13 NOTE — Telephone Encounter (Signed)
Patient is calling and wanted to see if xray results were back, please advise. CB is [phone]

## 2020-02-24 DIAGNOSIS — M545 Low back pain: Secondary | ICD-10-CM | POA: Diagnosis not present

## 2020-02-26 ENCOUNTER — Other Ambulatory Visit: Payer: Self-pay

## 2020-02-26 ENCOUNTER — Ambulatory Visit: Payer: Medicare Other | Admitting: Neurology

## 2020-02-27 ENCOUNTER — Encounter: Payer: Self-pay | Admitting: Family Medicine

## 2020-02-27 ENCOUNTER — Ambulatory Visit (INDEPENDENT_AMBULATORY_CARE_PROVIDER_SITE_OTHER): Payer: Medicare Other | Admitting: Family Medicine

## 2020-02-27 VITALS — BP 126/68 | HR 87 | Temp 97.4°F | Ht 63.0 in | Wt 127.6 lb

## 2020-02-27 DIAGNOSIS — E559 Vitamin D deficiency, unspecified: Secondary | ICD-10-CM | POA: Diagnosis not present

## 2020-02-27 DIAGNOSIS — M545 Low back pain, unspecified: Secondary | ICD-10-CM

## 2020-02-27 DIAGNOSIS — I10 Essential (primary) hypertension: Secondary | ICD-10-CM | POA: Diagnosis not present

## 2020-02-27 DIAGNOSIS — E611 Iron deficiency: Secondary | ICD-10-CM

## 2020-02-27 DIAGNOSIS — Z23 Encounter for immunization: Secondary | ICD-10-CM

## 2020-02-27 DIAGNOSIS — F17200 Nicotine dependence, unspecified, uncomplicated: Secondary | ICD-10-CM

## 2020-02-27 DIAGNOSIS — J449 Chronic obstructive pulmonary disease, unspecified: Secondary | ICD-10-CM | POA: Diagnosis not present

## 2020-02-27 LAB — CBC
HCT: 38.7 % (ref 36.0–46.0)
Hemoglobin: 12.4 g/dL (ref 12.0–15.0)
MCHC: 32 g/dL (ref 30.0–36.0)
MCV: 90.8 fl (ref 78.0–100.0)
Platelets: 305 10*3/uL (ref 150.0–400.0)
RBC: 4.26 Mil/uL (ref 3.87–5.11)
RDW: 13.2 % (ref 11.5–15.5)
WBC: 11.5 10*3/uL — ABNORMAL HIGH (ref 4.0–10.5)

## 2020-02-27 MED ORDER — AMLODIPINE BESYLATE 5 MG PO TABS: 5.0000 mg | ORAL_TABLET | Freq: Every day | ORAL | 2 refills | Status: DC

## 2020-02-27 MED ORDER — GABAPENTIN 100 MG PO CAPS: 100.0000 mg | ORAL_CAPSULE | Freq: Three times a day (TID) | ORAL | 3 refills | Status: DC

## 2020-02-27 NOTE — Patient Instructions (Signed)
Coping with Quitting Smoking  Quitting smoking is a physical and mental challenge. You will face cravings, withdrawal symptoms, and temptation. Before quitting, work with your health care provider to make a plan that can help you cope. Preparation can help you quit and keep you from giving in. How can I cope with cravings? Cravings usually last for 5-10 minutes. If you get through it, the craving will pass. Consider taking the following actions to help you cope with cravings:  Keep your mouth busy: ? Chew sugar-free gum. ? Suck on hard candies or a straw. ? Brush your teeth.  Keep your hands and body busy: ? Immediately change to a different activity when you feel a craving. ? Squeeze or play with a ball. ? Do an activity or a hobby, like making bead jewelry, practicing needlepoint, or working with wood. ? Mix up your normal routine. ? Take a short exercise break. Go for a quick walk or run up and down stairs. ? Spend time in public places where smoking is not allowed.  Focus on doing something kind or helpful for someone else.  Call a friend or family member to talk during a craving.  Join a support group.  Call a quit line, such as 1-800-QUIT-NOW.  Talk with your health care provider about medicines that might help you cope with cravings and make quitting easier for you. How can I deal with withdrawal symptoms? Your body may experience negative effects as it tries to get used to not having nicotine in the system. These effects are called withdrawal symptoms. They may include:  Feeling hungrier than normal.  Trouble concentrating.  Irritability.  Trouble sleeping.  Feeling depressed.  Restlessness and agitation.  Craving a cigarette. To manage withdrawal symptoms:  Avoid places, people, and activities that trigger your cravings.  Remember why you want to quit.  Get plenty of sleep.  Avoid coffee and other caffeinated drinks. These may worsen some of your  symptoms. How can I handle social situations? Social situations can be difficult when you are quitting smoking, especially in the first few weeks. To manage this, you can:  Avoid parties, bars, and other social situations where people might be smoking.  Avoid alcohol.  Leave right away if you have the urge to smoke.  Explain to your family and friends that you are quitting smoking. Ask for understanding and support.  Plan activities with friends or family where smoking is not an option. What are some ways I can cope with stress? Wanting to smoke may cause stress, and stress can make you want to smoke. Find ways to manage your stress. Relaxation techniques can help. For example:  Breathe slowly and deeply, in through your nose and out through your mouth.  Listen to soothing, relaxing music.  Talk with a family member or friend about your stress.  Light a candle.  Soak in a bath or take a shower.  Think about a peaceful place. What are some ways I can prevent weight gain? Be aware that many people gain weight after they quit smoking. However, not everyone does. To keep from gaining weight, have a plan in place before you quit and stick to the plan after you quit. Your plan should include:  Having healthy snacks. When you have a craving, it may help to: ? Eat plain popcorn, crunchy carrots, celery, or other cut vegetables. ? Chew sugar-free gum.  Changing how you eat: ? Eat small portion sizes at meals. ? Eat 4-6 small meals   throughout the day instead of 1-2 large meals a day. ? Be mindful when you eat. Do not watch television or do other things that might distract you as you eat.  Exercising regularly: ? Make time to exercise each day. If you do not have time for a long workout, do short bouts of exercise for 5-10 minutes several times a day. ? Do some form of strengthening exercise, like weight lifting, and some form of aerobic exercise, like running or swimming.  Drinking  plenty of water or other low-calorie or no-calorie drinks. Drink 6-8 glasses of water daily, or as much as instructed by your health care provider. Summary  Quitting smoking is a physical and mental challenge. You will face cravings, withdrawal symptoms, and temptation to smoke again. Preparation can help you as you go through these challenges.  You can cope with cravings by keeping your mouth busy (such as by chewing gum), keeping your body and hands busy, and making calls to family, friends, or a helpline for people who want to quit smoking.  You can cope with withdrawal symptoms by avoiding places where people smoke, avoiding drinks with caffeine, and getting plenty of rest.  Ask your health care provider about the different ways to prevent weight gain, avoid stress, and handle social situations. This information is not intended to replace advice given to you by your health care provider. Make sure you discuss any questions you have with your health care provider. Document Revised: 05/11/2017 Document Reviewed: 05/26/2016 Elsevier Patient Education  2020 Elsevier Inc.  

## 2020-02-27 NOTE — Progress Notes (Signed)
Established Patient Office Visit  Subjective:  Patient ID: Ana Santos, female    DOB: 11/27/1940  Age: 79 y.o. MRN: [id number]  CC:  Chief Complaint  Patient presents with  . Follow-up    follow up on back pain, patient states that she went to see orthopedic sergeon.     HPI Ana Santos presents for follow-up of hypertension, vitamin D deficiency, iron deficiency, lower back pain without sciatica.  She is seen an orthopedic surgeon who then ordered an MRI.  He started her back on tramadol.  She is doing well with the gabapentin taking it twice a day without issue.  She continues high-dose vitamin D daily.  She is taking a multivitamin with iron and believes that she could take elemental iron if needed.  She continues to smoke.  Past Medical History:  Diagnosis Date  . AN (acoustic neuroma) ([place])   . Aneurysm ([place])   . Anxiety   . Cataract   . Cerebrovascular disease   . Cigarette smoker   . COPD (chronic obstructive pulmonary disease) ([facility])    mild, no inhalers or oxygen used  . Coronary artery disease    no current cardiologist  pt. denies  . Deafness in right ear   . Depression   . Headache    tension headaches due to aneurysm repair with stent  . Heart murmur   . History of kidney stones    15 to 20 yrs ago  . Hyperlipidemia   . Hypothyroidism   . IBS (irritable bowel syndrome)   . Mitral valve prolapse    mild takes beta blocker  . Osteopenia   . Palpitations   . Stroke ([place])    x 3 ministrokes last one feb 2014  . Venous insufficiency   . Vitamin D deficiency     Past Surgical History:  Procedure Laterality Date  . ANEURYSM COILING  11-10   Dr. Estanislado Pandy  . BREAST LUMPECTOMY Right 1986   benign  . DIAGNOSTIC LAPAROSCOPY     Proable laparoscopic right colectomy 03-14-18 Dr. Excell Seltzer  . EYE SURGERY Bilateral    ioc lens for cataracts  . LAPAROSCOPIC RIGHT COLECTOMY Right 03/14/2018   Procedure: LAPAROSCOPIC ASSISTED  RIGHT COLECTOMY;  Surgeon:  Excell Seltzer, MD;  Location: [facility];  Service: General;  Laterality: Right;  . LAPAROSCOPY N/A 03/14/2018   Procedure: LAPAROSCOPY;  Surgeon: Excell Seltzer, MD;  Location: [facility];  Service: General;  Laterality: N/A;  . LEFT HEART CATHETERIZATION WITH CORONARY ANGIOGRAM N/A 08/05/2014   Procedure: LEFT HEART CATHETERIZATION WITH CORONARY ANGIOGRAM;  Surgeon: Wellington Hampshire, MD;  Location: [facility];  Service: Cardiovascular;  Laterality: N/A;  . TONSILLECTOMY    . TOTAL ABDOMINAL HYSTERECTOMY  1970   complete  . TRANSLABYRINTHINE PROCEDURE  2002   [facility] Dr. Vicie Mutters tumor removal  . TRANSURETHRAL RESECTION OF BLADDER TUMOR N/A 02/08/2018   Procedure: TRANSURETHRAL RESECTION OF BLADDER TUMOR (TURBT)/ POSTOPERATIVE INSTILLATION OF CHEMO THERAPY;  Surgeon: Festus Aloe, MD;  Location: [facility];  Service: Urology;  Laterality: N/A;    Family History  Problem Relation Age of Onset  . Heart attack Mother   . Diabetes Mother   . Cancer Father   . Diabetes Sister   . Diabetes Brother   . Cancer Sister        unkown type  . Lung cancer Brother        Lung  . AAA (abdominal aortic aneurysm) Neg Hx   .  Colon cancer Neg Hx   . Stomach cancer Neg Hx   . Esophageal cancer Neg Hx   . Liver cancer Neg Hx   . Pancreatic cancer Neg Hx   . Rectal cancer Neg Hx     Social History   Socioeconomic History  . Marital status: Married    Spouse name: Not on file  . Number of children: Not on file  . Years of education: Not on file  . Highest education level: Not on file  Occupational History  . Occupation: alterations  Tobacco Use  . Smoking status: Current Every Day Smoker    Packs/day: 0.25    Years: 50.00    Pack years: 12.50    Types: Cigarettes  . Smokeless tobacco: Never Used  . Tobacco comment: 4-5 cigs daily  is aware she needs to quit  Vaping Use  . Vaping Use: Never used  Substance and Sexual Activity  . Alcohol use: No    Alcohol/week: 0.0  standard drinks  . Drug use: No  . Sexual activity: Not Currently  Other Topics Concern  . Not on file  Social History Narrative   Pt is married, 5 sons (4 living), 14 grandchildren, 4 great grandchildren.   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 Difficulty of Paying Living Expenses: Not hard at all  Food Insecurity: No Food Insecurity  . Worried About Charity fundraiser in the Last Year: Never true  . Ran Out of Food in the Last Year: Never true  Transportation Needs: No Transportation Needs  . Lack of Transportation (Medical): No  . Lack of Transportation (Non-Medical): No  Physical Activity: Inactive  . Days of Exercise per Week: 0 days  . Minutes of Exercise per Session: 0 min  Stress: No Stress Concern Present  . Feeling of Stress : Not at all  Social Connections: Moderately Isolated  . Frequency of Communication with Friends and Family: More than three times a week  . Frequency of Social Gatherings with Friends and Family: Once a week  . Attends Religious Services: Never  . Active Member of Clubs or Organizations: No  . Attends Archivist Meetings: Never  . Marital Status: Married  Human resources officer Violence: Not At Risk  . Fear of Current or Ex-Partner: No  . Emotionally Abused: No  . Physically Abused: No  . Sexually Abused: No    Outpatient Medications Prior to Visit  Medication Sig Dispense Refill  . acetaminophen (TYLENOL) 500 MG tablet Take 500 mg by mouth every 6 (six) hours as needed for mild pain.     . cholecalciferol (VITAMIN D) 1000 UNITS tablet Take 1,000 Units by mouth daily.      . clopidogrel (PLAVIX) 75 MG tablet TAKE 1 TABLET DAILY 90 tablet 3  . hydroxypropyl methylcellulose / hypromellose (ISOPTO TEARS / GONIOVISC) 2.5 % ophthalmic solution Place 1 drop into both eyes 2 (two) times daily.     Marland Kitchen levothyroxine (SYNTHROID) 75 MCG  tablet Take 1 tablet (75 mcg total) by mouth daily before breakfast. 90 tablet 2  . methocarbamol (ROBAXIN) 500 MG tablet May take one every 8 hours as needed for headache. 30 tablet 1  . pravastatin (PRAVACHOL) 40 MG tablet TAKE 1 TABLET DAILY 90 tablet 3  . traMADol (ULTRAM) 50 MG tablet Take 50 mg by mouth at bedtime.    Marland Kitchen amLODipine (  NORVASC) 5 MG tablet Take 1 tablet (5 mg total) by mouth daily. 90 tablet 0  . gabapentin (NEURONTIN) 100 MG capsule Take one at night for one week and then twice daily as tolerated. 60 capsule 1  . diazepam (VALIUM) 10 MG tablet SMARTSIG:1-2 Tablet(s) By Mouth    . pramipexole (MIRAPEX) 0.5 MG tablet Take one nightly one hour prior to going to bed as needed for restless leg syndrome. (Patient not taking: Reported on 02/27/2020) 30 tablet 2   No facility-administered medications prior to visit.    Allergies  Allergen Reactions  . Atorvastatin Other (See Comments)     Lipitor caused arm pain (3/09)  . Hydrocodone-Acetaminophen Other (See Comments)    hallucinations  . Morphine Other (See Comments)    hallucinations  . Nortriptyline     Caused nausea and vomiting and shaking, kept her up all night  . Topamax [Topiramate] Nausea And Vomiting    *dizziness*    ROS Review of Systems  Constitutional: Negative.   HENT: Negative.   Eyes: Negative for photophobia and visual disturbance.  Respiratory: Negative.   Cardiovascular: Negative.   Gastrointestinal: Negative.   Endocrine: Negative for polyphagia and polyuria.  Genitourinary: Negative.   Musculoskeletal: Negative for gait problem and joint swelling.  Skin: Negative for pallor and rash.  Allergic/Immunologic: Negative for immunocompromised state.  Neurological: Negative for light-headedness and numbness.  Hematological: Does not bruise/bleed easily.  Psychiatric/Behavioral: Negative.       Objective:    Physical Exam Vitals and nursing note reviewed.  Constitutional:      General: She is  not in acute distress.    Appearance: Normal appearance. She is normal weight. She is not ill-appearing, toxic-appearing or diaphoretic.  HENT:     Head: Normocephalic and atraumatic.     Right Ear: External ear normal.     Left Ear: External ear normal.     Mouth/Throat:     Mouth: Mucous membranes are moist.     Pharynx: Oropharynx is clear. No oropharyngeal exudate or posterior oropharyngeal erythema.  Eyes:     General: No scleral icterus.       Right eye: No discharge.        Left eye: No discharge.     Extraocular Movements: Extraocular movements intact.     Conjunctiva/sclera: Conjunctivae normal.     Pupils: Pupils are equal, round, and reactive to light.  Cardiovascular:     Rate and Rhythm: Normal rate and regular rhythm.  Pulmonary:     Breath sounds: Decreased breath sounds present.  Abdominal:     General: Bowel sounds are normal.  Musculoskeletal:     Cervical back: No rigidity or tenderness.     Right lower leg: No edema.     Left lower leg: No edema.  Lymphadenopathy:     Cervical: No cervical adenopathy.  Skin:    General: Skin is warm and dry.  Neurological:     Mental Status: She is alert and oriented to person, place, and time.  Psychiatric:        Mood and Affect: Mood normal.        Behavior: Behavior normal.     BP 126/68   Pulse 87   Temp (!) 97.4 F (36.3 C) (Tympanic)   Ht 5\' 3"  (1.6 m)   Wt 127 lb 9.6 oz (57.9 kg)   SpO2 98%   BMI 22.60 kg/m  Wt Readings from Last 3 Encounters:  02/27/20 127 lb 9.6 oz (  57.9 kg)  02/06/20 125 lb (56.7 kg)  01/29/20 118 lb (53.5 kg)     Health Maintenance Due  Topic Date Due  . Hepatitis C Screening  Never done  . INFLUENZA VACCINE  01/11/2020    There are no preventive care reminders to display for this patient.  Lab Results  Component Value Date   TSH 1.78 10/29/2019   Lab Results  Component Value Date   WBC 5.5 10/29/2019   HGB 14.2 10/29/2019   HCT 43.0 10/29/2019   MCV 91.5  10/29/2019   PLT 217.0 10/29/2019   Lab Results  Component Value Date   NA 139 10/29/2019   K 4.2 10/29/2019   CO2 26 10/29/2019   GLUCOSE 89 10/29/2019   BUN 16 10/29/2019   CREATININE 0.71 10/29/2019   BILITOT 0.3 05/22/2018   ALKPHOS 90 05/22/2018   AST 12 05/22/2018   ALT 5 05/22/2018   PROT 7.4 05/22/2018   ALBUMIN 4.0 05/22/2018   CALCIUM 9.0 10/29/2019   ANIONGAP 6 03/23/2018   GFR 79.45 10/29/2019   Lab Results  Component Value Date   CHOL 180 10/29/2019   Lab Results  Component Value Date   HDL 63.50 10/29/2019   Lab Results  Component Value Date   LDLCALC 104 (H) 10/29/2019   Lab Results  Component Value Date   TRIG 63.0 10/29/2019   Lab Results  Component Value Date   CHOLHDL 3 10/29/2019   No results found for: HGBA1C    Assessment & Plan:   Problem List Items Addressed This Visit      Cardiovascular and Mediastinum   Essential hypertension   Relevant Medications   amLODipine (NORVASC) 5 MG tablet     Respiratory   COPD (chronic obstructive pulmonary disease) with chronic bronchitis ([facility])     Other   Vitamin D deficiency - Primary   CIGARETTE SMOKER   Left-sided low back pain without sciatica   Relevant Medications   traMADol (ULTRAM) 50 MG tablet   gabapentin (NEURONTIN) 100 MG capsule   Iron deficiency   Relevant Orders   CBC   Iron, TIBC and Ferritin Panel      Meds ordered this encounter  Medications  . gabapentin (NEURONTIN) 100 MG capsule    Sig: Take 1 capsule (100 mg total) by mouth 3 (three) times daily.    Dispense:  90 capsule    Refill:  3  . amLODipine (NORVASC) 5 MG tablet    Sig: Take 1 tablet (5 mg total) by mouth daily.    Dispense:  90 tablet    Refill:  2    Follow-up: Return in about 3 months (around 05/28/2020), or Never too late to quit smoking!.  Given information on coping with quitting smoking.  We will continue multivitamin with iron and high-dose vitamin D.  Refilled amlodipine.  Have increased  the gabapentin to 3 times daily.  Libby Maw, MD

## 2020-02-28 LAB — IRON,TIBC AND FERRITIN PANEL
%SAT: 12 % (calc) — ABNORMAL LOW (ref 16–45)
Ferritin: 56 ng/mL (ref 16–288)
Iron: 36 ug/dL — ABNORMAL LOW (ref 45–160)
TIBC: 302 mcg/dL (calc) (ref 250–450)

## 2020-03-02 DIAGNOSIS — M48061 Spinal stenosis, lumbar region without neurogenic claudication: Secondary | ICD-10-CM | POA: Diagnosis not present

## 2020-03-02 DIAGNOSIS — M5126 Other intervertebral disc displacement, lumbar region: Secondary | ICD-10-CM | POA: Diagnosis not present

## 2020-03-02 DIAGNOSIS — M4319 Spondylolisthesis, multiple sites in spine: Secondary | ICD-10-CM | POA: Diagnosis not present

## 2020-03-02 DIAGNOSIS — M5127 Other intervertebral disc displacement, lumbosacral region: Secondary | ICD-10-CM | POA: Diagnosis not present

## 2020-03-02 DIAGNOSIS — M545 Low back pain: Secondary | ICD-10-CM | POA: Diagnosis not present

## 2020-03-09 DIAGNOSIS — M545 Low back pain: Secondary | ICD-10-CM | POA: Diagnosis not present

## 2020-03-10 ENCOUNTER — Ambulatory Visit: Payer: Medicare Other | Admitting: Family Medicine

## 2020-03-12 ENCOUNTER — Telehealth: Payer: Self-pay | Admitting: Family Medicine

## 2020-03-12 NOTE — Telephone Encounter (Signed)
With her hx of CVA due to embolism, I recommend bridging with lovenox SQ What kind of ortho procedure is she having?

## 2020-03-12 NOTE — Telephone Encounter (Signed)
She needs a lovenox SQ bridge

## 2020-03-12 NOTE — Telephone Encounter (Signed)
Called patient and per her Ortho doctor she need the okay to stop the plavix and then they will schedule the procedure for 5 days after.  (Dr Ethelene Hal pt) Please review and advise.

## 2020-03-12 NOTE — Telephone Encounter (Signed)
Patient needs to schedule for orthopedic procedure for a pinched nerve but needs to be off Plavix for 5 days prior to procedure. Per her Orthopedic doctor, she needs okay from her doctor before stopping Plavix. Sending to Doc of the Day.

## 2020-03-12 NOTE — Telephone Encounter (Signed)
Patient states that she is going to having an Epidural injection due to having a pinched nerve in her lower back.   Please advise. Thanks.  Dm/cma

## 2020-03-15 ENCOUNTER — Telehealth: Payer: Self-pay | Admitting: Family Medicine

## 2020-03-15 NOTE — Telephone Encounter (Signed)
Patient notified Via phone that it was okayed per Dr Ethelene Hal in previous message.  Dm/cma

## 2020-03-15 NOTE — Telephone Encounter (Signed)
Message has been sent to Dr Ethelene Hal to advise on and was sent to [facility] on Friday as well.   Dm/cma

## 2020-03-15 NOTE — Telephone Encounter (Signed)
Patient notified VIA phone and will call to get her procedure scheduled.  Dm/cma

## 2020-03-15 NOTE — Telephone Encounter (Signed)
Patient called back and wanted to see if Dr. Ethelene Hal could fax something over saying she can go off of her medication to  Dr. Towanda Octave office, please advise. CB is [phone].

## 2020-03-15 NOTE — Telephone Encounter (Signed)
Patient needs to schedule for orthopedic procedure for a pinched nerve but needs to be off Plavix for 5 days prior to procedure. Per her Orthopedic doctor, she needs okay from her doctor before stopping Plavix. Sending to Doc of the Day.  I sent this to [facility] and Friday, she suggested that she will need a Lovenox bridge but didn't order this.  Patient is going to be having an Epidural Injection and they will not schedule it till she gets the okay to stop taking the Plavix.  Please advise.  Thanks. Dm/cma

## 2020-03-15 NOTE — Telephone Encounter (Signed)
Okay to hold 5 days prior to procedure.

## 2020-03-15 NOTE — Telephone Encounter (Signed)
Patient called and stated that she has to have a procedure done for her back and needs to know if she can go off of Plavix for 5 days, please advise. CB is [phone]

## 2020-03-16 NOTE — Telephone Encounter (Signed)
Yes please

## 2020-03-16 NOTE — Telephone Encounter (Signed)
Patient calling states that she needs something in writing stating that it will be okay for patient to stop Plavix prior to upcoming procedure. Okay for note or order? Please advise.

## 2020-03-16 NOTE — Telephone Encounter (Signed)
Patient is returning the call, please advise. CB is [phone]

## 2020-03-17 ENCOUNTER — Encounter: Payer: Self-pay | Admitting: Family Medicine

## 2020-03-17 NOTE — Telephone Encounter (Signed)
Patient aware note faxed to Dr. Donivan Scull for patient to be off Plavix 5 days prior to her surgery.

## 2020-03-29 DIAGNOSIS — M5136 Other intervertebral disc degeneration, lumbar region: Secondary | ICD-10-CM | POA: Diagnosis not present

## 2020-03-29 DIAGNOSIS — M545 Low back pain, unspecified: Secondary | ICD-10-CM | POA: Diagnosis not present

## 2020-03-29 DIAGNOSIS — M4727 Other spondylosis with radiculopathy, lumbosacral region: Secondary | ICD-10-CM | POA: Diagnosis not present

## 2020-04-10 DIAGNOSIS — Z23 Encounter for immunization: Secondary | ICD-10-CM | POA: Diagnosis not present

## 2020-05-10 ENCOUNTER — Other Ambulatory Visit: Payer: Self-pay

## 2020-05-10 DIAGNOSIS — I10 Essential (primary) hypertension: Secondary | ICD-10-CM

## 2020-05-10 DIAGNOSIS — M545 Low back pain, unspecified: Secondary | ICD-10-CM

## 2020-05-10 MED ORDER — AMLODIPINE BESYLATE 5 MG PO TABS: 5.0000 mg | ORAL_TABLET | Freq: Every day | ORAL | 1 refills | Status: DC

## 2020-05-10 MED ORDER — GABAPENTIN 100 MG PO CAPS: 100.0000 mg | ORAL_CAPSULE | Freq: Three times a day (TID) | ORAL | 1 refills | Status: DC

## 2020-05-10 NOTE — Telephone Encounter (Signed)
Received a request for mail order ([facility]) for Amlodipine  5 mg & Gabapentin 100 mg. Spoke to patient and she wants it to be sent there due to Ana Santos is no longer acceptng [facility].   RX sent mail order.  .mdc

## 2020-05-18 ENCOUNTER — Other Ambulatory Visit: Payer: Medicare Other

## 2020-05-18 ENCOUNTER — Ambulatory Visit: Payer: Medicare Other

## 2020-06-25 DIAGNOSIS — M4727 Other spondylosis with radiculopathy, lumbosacral region: Secondary | ICD-10-CM | POA: Diagnosis not present

## 2020-06-25 DIAGNOSIS — M47817 Spondylosis without myelopathy or radiculopathy, lumbosacral region: Secondary | ICD-10-CM | POA: Diagnosis not present

## 2020-07-12 ENCOUNTER — Other Ambulatory Visit: Payer: Self-pay | Admitting: Family Medicine

## 2020-07-12 DIAGNOSIS — E039 Hypothyroidism, unspecified: Secondary | ICD-10-CM

## 2020-07-26 DIAGNOSIS — Z8551 Personal history of malignant neoplasm of bladder: Secondary | ICD-10-CM | POA: Diagnosis not present

## 2020-07-30 DIAGNOSIS — M545 Low back pain, unspecified: Secondary | ICD-10-CM | POA: Diagnosis not present

## 2020-07-30 DIAGNOSIS — M533 Sacrococcygeal disorders, not elsewhere classified: Secondary | ICD-10-CM | POA: Diagnosis not present

## 2020-07-30 DIAGNOSIS — M5416 Radiculopathy, lumbar region: Secondary | ICD-10-CM | POA: Diagnosis not present

## 2020-07-31 ENCOUNTER — Other Ambulatory Visit: Payer: Self-pay | Admitting: Family Medicine

## 2020-07-31 DIAGNOSIS — I6350 Cerebral infarction due to unspecified occlusion or stenosis of unspecified cerebral artery: Secondary | ICD-10-CM

## 2020-08-12 DIAGNOSIS — M533 Sacrococcygeal disorders, not elsewhere classified: Secondary | ICD-10-CM | POA: Diagnosis not present

## 2020-08-12 DIAGNOSIS — M5416 Radiculopathy, lumbar region: Secondary | ICD-10-CM | POA: Diagnosis not present

## 2020-08-12 DIAGNOSIS — M545 Low back pain, unspecified: Secondary | ICD-10-CM | POA: Diagnosis not present

## 2020-08-19 DIAGNOSIS — M533 Sacrococcygeal disorders, not elsewhere classified: Secondary | ICD-10-CM | POA: Diagnosis not present

## 2020-08-19 DIAGNOSIS — M6281 Muscle weakness (generalized): Secondary | ICD-10-CM | POA: Diagnosis not present

## 2020-08-19 DIAGNOSIS — M545 Low back pain, unspecified: Secondary | ICD-10-CM | POA: Diagnosis not present

## 2020-08-22 DIAGNOSIS — L509 Urticaria, unspecified: Secondary | ICD-10-CM | POA: Diagnosis not present

## 2020-08-23 ENCOUNTER — Ambulatory Visit: Payer: Medicare Other

## 2020-08-23 ENCOUNTER — Other Ambulatory Visit: Payer: Medicare Other

## 2020-08-23 ENCOUNTER — Inpatient Hospital Stay: Admission: RE | Admit: 2020-08-23 | Payer: Medicare Other | Source: Ambulatory Visit

## 2020-08-23 DIAGNOSIS — M6281 Muscle weakness (generalized): Secondary | ICD-10-CM | POA: Diagnosis not present

## 2020-08-23 DIAGNOSIS — M533 Sacrococcygeal disorders, not elsewhere classified: Secondary | ICD-10-CM | POA: Diagnosis not present

## 2020-08-23 DIAGNOSIS — M545 Low back pain, unspecified: Secondary | ICD-10-CM | POA: Diagnosis not present

## 2020-08-26 ENCOUNTER — Other Ambulatory Visit: Payer: Self-pay

## 2020-08-27 ENCOUNTER — Encounter: Payer: Self-pay | Admitting: Family Medicine

## 2020-08-27 ENCOUNTER — Ambulatory Visit (INDEPENDENT_AMBULATORY_CARE_PROVIDER_SITE_OTHER): Payer: Medicare Other | Admitting: Family Medicine

## 2020-08-27 VITALS — BP 134/76 | HR 60 | Temp 97.9°F | Ht 63.0 in | Wt 125.4 lb

## 2020-08-27 DIAGNOSIS — E039 Hypothyroidism, unspecified: Secondary | ICD-10-CM | POA: Diagnosis not present

## 2020-08-27 DIAGNOSIS — E611 Iron deficiency: Secondary | ICD-10-CM | POA: Diagnosis not present

## 2020-08-27 DIAGNOSIS — E559 Vitamin D deficiency, unspecified: Secondary | ICD-10-CM

## 2020-08-27 DIAGNOSIS — M7989 Other specified soft tissue disorders: Secondary | ICD-10-CM | POA: Diagnosis not present

## 2020-08-27 DIAGNOSIS — R5383 Other fatigue: Secondary | ICD-10-CM | POA: Diagnosis not present

## 2020-08-27 DIAGNOSIS — F17209 Nicotine dependence, unspecified, with unspecified nicotine-induced disorders: Secondary | ICD-10-CM

## 2020-08-27 MED ORDER — VITAMIN D (ERGOCALCIFEROL) 1.25 MG (50000 UNIT) PO CAPS: 50000.0000 [IU] | ORAL_CAPSULE | ORAL | 3 refills | Status: DC

## 2020-08-27 MED ORDER — FERROUS SULFATE 325 (65 FE) MG PO TBEC: 325.0000 mg | DELAYED_RELEASE_TABLET | Freq: Two times a day (BID) | ORAL | 3 refills | Status: DC

## 2020-08-27 NOTE — Progress Notes (Signed)
[facility] [address] [place] [address] Dept: [phone] Dept Fax: [phone]  Acute Office Visit  Subjective:    Patient ID: Ana Santos, female    DOB: [birth year], 80 y.o..   MRN: [id number]  Chief Complaint  Patient presents with  . Acute Visit    C/o not having any energy x 1 month and also has an spot on her head where something fell out of the cabinet and hit her 2 weeks ago.       History of Present Illness:  Patient is in today complaining of a gradual onset of feeling like she has no energy over the past month. She stays active and denies any palpitations or shortness of breath. She has a past history of iron deficiency, Vitamin D deficiency, and hypothyroidism. She is currently on Vitamin D 1000 IU bid and levothyroxine.  She has a past history of GI bleeds, but has not seen any bleeding or dark stools. She is on Plavix due to prior CVAs. She notes easy bruising of both arms.  Ms. Macioce also notes that she had an issue having been struck in the forehead by a punch bowl that fell off a shelf. She had a large knot there initially. Since the knot went down, she has noted a divot in the skin of her forehead.  Ms. Orbach continues to smoke cigarettes. She currently is down to 7-8 cigarettes a day. She remains committed to try and stop, but admits it has been difficult.  Past Medical History: Patient Active Problem List   Diagnosis Date Noted  . Iron deficiency 02/06/2020  . Labyrinthitis 12/08/2019  . Left-sided low back pain without sciatica 05/05/2019  . Visual loss, transient, bilateral 02/21/2019  . Essential hypertension 02/21/2019  . Anemia, iron deficiency 06/11/2018  . Colonic mass s/p lap ileocectomy 03/14/2018 03/14/2018  . Acute lower GI bleeding 03/14/2018  . Chronic anticoagulation 03/14/2018  . Chronic tension-type headache, not intractable 05/31/2015  . Symptomatic PVCs 08/04/2014  .  Chest pain 08/03/2014  . TIA (transient ischemic attack) 08/26/2013  . Palpitations 07/09/2013  . Restless legs syndrome (RLS) 07/04/2012  . Cerebral artery occlusion with cerebral infarction ([employer]) 03/30/2010  . Cerebrovascular disease 07/13/2009  . History of cerebral aneurysm 03/26/2009  . Vitamin D deficiency 08/10/2008  . Venous insufficiency 08/10/2008  . Disorder of bone and cartilage 12/18/2007  . 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[place]) 08/18/2007  . Hypothyroidism 08/18/2007  . Tobacco use disorder, continuous 08/18/2007  . Hyperlipidemia 07/18/2007  . Anxiety state 07/18/2007  . Mitral valve disorder 07/18/2007  . COPD (chronic obstructive pulmonary disease) with chronic bronchitis ([place]) 07/18/2007  . Irritable bowel syndrome 07/18/2007  . Fibromyalgia 07/18/2007   Past Surgical History:  Procedure Laterality Date  . ANEURYSM COILING  11-10   Dr. Estanislado Pandy  . BREAST LUMPECTOMY Right 1986   benign  . DIAGNOSTIC LAPAROSCOPY     Proable laparoscopic right colectomy 03-14-18 Dr. Excell Seltzer  . EYE SURGERY Bilateral    ioc lens for cataracts  . LAPAROSCOPIC RIGHT COLECTOMY Right 03/14/2018   Procedure: LAPAROSCOPIC ASSISTED  RIGHT COLECTOMY;  Surgeon: Excell Seltzer, MD;  Location: [facility];  Service: General;  Laterality: Right;  . LAPAROSCOPY N/A 03/14/2018   Procedure: LAPAROSCOPY;  Surgeon: Excell Seltzer, MD;  Location: [facility];  Service: General;  Laterality: N/A;  . LEFT HEART CATHETERIZATION WITH CORONARY ANGIOGRAM N/A 08/05/2014   Procedure: LEFT HEART CATHETERIZATION WITH CORONARY ANGIOGRAM;  Surgeon: Rogue Jury  Ferne Reus, MD;  Location: [facility];  Service: Cardiovascular;  Laterality: N/A;  . TONSILLECTOMY    . TOTAL ABDOMINAL HYSTERECTOMY  1970   complete  . TRANSLABYRINTHINE PROCEDURE  2002   [facility] Dr. Vicie Mutters tumor removal  . TRANSURETHRAL RESECTION OF BLADDER TUMOR N/A 02/08/2018   Procedure: TRANSURETHRAL RESECTION OF BLADDER TUMOR (TURBT)/ POSTOPERATIVE  INSTILLATION OF CHEMO THERAPY;  Surgeon: Festus Aloe, MD;  Location: [facility];  Service: Urology;  Laterality: N/A;   Family History  Problem Relation Age of Onset  . Heart attack Mother   . Diabetes Mother   . Cancer Father   . Diabetes Sister   . Diabetes Brother   . Cancer Sister        unkown type  . Lung cancer Brother        Lung  . AAA (abdominal aortic aneurysm) Neg Hx   . Colon cancer Neg Hx   . Stomach cancer Neg Hx   . Esophageal cancer Neg Hx   . Liver cancer Neg Hx   . Pancreatic cancer Neg Hx   . Rectal cancer Neg Hx    Outpatient Medications Prior to Visit  Medication Sig Dispense Refill  . acetaminophen (TYLENOL) 500 MG tablet Take 500 mg by mouth every 6 (six) hours as needed for mild pain.    Marland Kitchen amLODipine (NORVASC) 5 MG tablet Take 1 tablet (5 mg total) by mouth daily. 90 tablet 1  . clopidogrel (PLAVIX) 75 MG tablet TAKE 1 TABLET DAILY 90 tablet 3  . diazepam (VALIUM) 10 MG tablet SMARTSIG:1-2 Tablet(s) By Mouth    . gabapentin (NEURONTIN) 100 MG capsule Take 1 capsule (100 mg total) by mouth 3 (three) times daily. 90 capsule 1  . hydroxypropyl methylcellulose / hypromellose (ISOPTO TEARS / GONIOVISC) 2.5 % ophthalmic solution Place 1 drop into both eyes 2 (two) times daily.    Marland Kitchen levothyroxine (SYNTHROID) 75 MCG tablet TAKE 1 TABLET DAILY BEFORE BREAKFAST 90 tablet 0  . lidocaine (LIDODERM) 5 %     . methocarbamol (ROBAXIN) 500 MG tablet May take one every 8 hours as needed for headache. 30 tablet 1  . pramipexole (MIRAPEX) 0.5 MG tablet Take one nightly one hour prior to going to bed as needed for restless leg syndrome. 30 tablet 2  . pravastatin (PRAVACHOL) 40 MG tablet TAKE 1 TABLET DAILY 90 tablet 3  . tiZANidine (ZANAFLEX) 4 MG tablet     . traMADol (ULTRAM) 50 MG tablet Take 50 mg by mouth at bedtime.    . cholecalciferol (VITAMIN D) 1000 UNITS tablet Take 1,000 Units by mouth daily.     No facility-administered medications prior  to visit.   Allergies  Allergen Reactions  . Atorvastatin Other (See Comments)     Lipitor caused arm pain (3/09)  . Hydrocodone-Acetaminophen Other (See Comments)    hallucinations  . Morphine Other (See Comments)    hallucinations  . Nortriptyline     Caused nausea and vomiting and shaking, kept her up all night  . Topamax [Topiramate] Nausea And Vomiting    *dizziness*     Objective:   Today's Vitals   08/27/20 1305  BP: 134/76  Pulse: 60  Temp: 97.9 F (36.6 C)  TempSrc: Temporal  SpO2: 99%  Weight: 125 lb 6.4 oz (56.9 kg)  Height: 5\' 3"  (1.6 m)   Body mass index is 22.21 kg/m.   General: Well developed, well nourished. No acute distress. HEENT: There is a nickel sized  depression in the skin of the forehead. There is no bony depression palpable. This feels like a   change in the subdermal tissue. Oral mucosa is pink. Lungs: Clear to auscultation bilaterally. CV: RRR without murmurs or rubs. Pulses 2+ bilaterally. Extremities:  No edema noted. Psych: Alert and oriented. Normal mood and affect.  Health Maintenance Due  Topic Date Due  . Hepatitis C Screening  Never done     Assessment & Plan:   1. Low energy Ms. Labus has a history of iron deficiency, Vit D. deficiency and hypothyroidism. Any of these could potentially be the underlying cause of her lack of energy. She has no signs of a current cardia or respiratory cause. I will check labs to assess.  2. Subcutaneous fat necrosis The divot in her skin feels like an area of fat necrosis secondary to the injury she had at this site. There eis nothing medically to be done for this. She is not interested in any cosmetic approaches.  3. Iron deficiency Ms. Luzader has a history of iron deficiency. Her last iron level in 02/2020 was low at 36, with a 12% iron saturation. She was not anemic at the time. I will repeat labs today, but do feel she needs to be on supplementation (was not on supplementation after the last  low values).  - CBC - Iron, TIBC and Ferritin Panel - ferrous sulfate 325 (65 FE) MG EC tablet; Take 1 tablet (325 mg total) by mouth in the morning and at bedtime.  Dispense: 60 tablet; Refill: 3  4. Vitamin D deficiency Ms. Fearn last Vitamin D level was low at 18.8 (10/2019). She has only been on a maintenance dose of Vit. D. I will increase her dose to the replacement dose for several months and then recheck her Vit. D level.  - Vitamin D, Ergocalciferol, (DRISDOL) 1.25 MG (50000 UNIT) CAPS capsule; Take 1 capsule (50,000 Units total) by mouth every 7 (seven) days.  Dispense: 5 capsule; Refill: 3  5. Tobacco use disorder, continuous We discussed her ongoing efforts at smoking cessation. I advised her to stop smoking. She continues to have the desire and will work at this.  6. Hypothyroidism, unspecified type Due to have TSH reassessed. We need to make sure this is not contributing to her low energy state.  - TSH  Haydee Salter, MD

## 2020-08-28 LAB — IRON,TIBC AND FERRITIN PANEL
%SAT: 16 % (calc) (ref 16–45)
Ferritin: 89 ng/mL (ref 16–288)
Iron: 51 ug/dL (ref 45–160)
TIBC: 322 mcg/dL (calc) (ref 250–450)

## 2020-08-28 LAB — CBC
HCT: 40.1 % (ref 35.0–45.0)
Hemoglobin: 13.3 g/dL (ref 11.7–15.5)
MCH: 29 pg (ref 27.0–33.0)
MCHC: 33.2 g/dL (ref 32.0–36.0)
MCV: 87.4 fL (ref 80.0–100.0)
MPV: 9.6 fL (ref 7.5–12.5)
Platelets: 356 10*3/uL (ref 140–400)
RBC: 4.59 10*6/uL (ref 3.80–5.10)
RDW: 13.2 % (ref 11.0–15.0)
WBC: 11.7 10*3/uL — ABNORMAL HIGH (ref 3.8–10.8)

## 2020-08-28 LAB — TSH: TSH: 0.43 mIU/L (ref 0.40–4.50)

## 2020-09-01 DIAGNOSIS — M545 Low back pain, unspecified: Secondary | ICD-10-CM | POA: Diagnosis not present

## 2020-09-01 DIAGNOSIS — M533 Sacrococcygeal disorders, not elsewhere classified: Secondary | ICD-10-CM | POA: Diagnosis not present

## 2020-09-01 DIAGNOSIS — M6281 Muscle weakness (generalized): Secondary | ICD-10-CM | POA: Diagnosis not present

## 2020-09-08 DIAGNOSIS — M6281 Muscle weakness (generalized): Secondary | ICD-10-CM | POA: Diagnosis not present

## 2020-09-08 DIAGNOSIS — M533 Sacrococcygeal disorders, not elsewhere classified: Secondary | ICD-10-CM | POA: Diagnosis not present

## 2020-09-08 DIAGNOSIS — M545 Low back pain, unspecified: Secondary | ICD-10-CM | POA: Diagnosis not present

## 2020-09-22 ENCOUNTER — Other Ambulatory Visit: Payer: Self-pay

## 2020-09-22 ENCOUNTER — Telehealth: Payer: Self-pay

## 2020-09-22 DIAGNOSIS — G8929 Other chronic pain: Secondary | ICD-10-CM

## 2020-09-22 NOTE — Telephone Encounter (Signed)
Referral placed.

## 2020-09-22 NOTE — Telephone Encounter (Signed)
Patient calling for a referral to different orthopedic for second opinion on the pains that she is having. Okay to refer or will patient need to come in for evaluation? Last OV with Dr. Gena Fray 08/27/20 last OV with Dr. Ethelene Hal 02/27/20. Please advise

## 2020-09-22 NOTE — Telephone Encounter (Addendum)
Pt is asking for a referral to a different orthopaedic. States she has had 4 injections, PT and no relief. Advise if she need an ov here first. Thank you

## 2020-10-05 ENCOUNTER — Ambulatory Visit (INDEPENDENT_AMBULATORY_CARE_PROVIDER_SITE_OTHER): Payer: Medicare Other | Admitting: Orthopaedic Surgery

## 2020-10-05 ENCOUNTER — Encounter: Payer: Self-pay | Admitting: Orthopaedic Surgery

## 2020-10-05 ENCOUNTER — Other Ambulatory Visit: Payer: Self-pay

## 2020-10-05 VITALS — BP 155/84 | HR 92 | Ht 63.0 in | Wt 125.0 lb

## 2020-10-05 DIAGNOSIS — M5442 Lumbago with sciatica, left side: Secondary | ICD-10-CM

## 2020-10-05 DIAGNOSIS — G8929 Other chronic pain: Secondary | ICD-10-CM | POA: Diagnosis not present

## 2020-10-05 DIAGNOSIS — I6350 Cerebral infarction due to unspecified occlusion or stenosis of unspecified cerebral artery: Secondary | ICD-10-CM

## 2020-10-05 DIAGNOSIS — M25552 Pain in left hip: Secondary | ICD-10-CM | POA: Diagnosis not present

## 2020-10-06 NOTE — Progress Notes (Signed)
Office Visit Note   Patient: Ana Santos           Date of Birth: 04/14/1941           MRN: [id number] Visit Date: 10/05/2020              Requested by: Libby Maw, MD [address] [place],  [place] [address] PCP: Libby Maw, MD   Assessment & Plan: Visit Diagnoses:  1. Chronic left-sided low back pain with left-sided sciatica   2. Pain in left hip     Plan: Patient tried medications physical therapy, 3 cortisone injections she relates done by "Dr. Leroy Sea" at the [facility] system.  She like proceed with MRI scan lumbar spine where radiographs demonstrate multilevel degenerative facet changes.  Office follow-up after lumbar MRI scan.  Follow-Up Instructions: No follow-ups on file.   Orders:  Orders Placed This Encounter  Procedures  . MR Lumbar Spine w/o contrast   No orders of the defined types were placed in this encounter.     Procedures: No procedures performed   Clinical Data: No additional findings.   Subjective: Chief Complaint  Patient presents with  . Lower Back - Pain    HPI 80 year old female seen with back pain and left hip pain.  She denies groin pain but does have numbness and tingling that goes down her left leg past her knee.  She states the pain is worse in the morning its been present since September.  She fell out of a chair landing on her back.  She has had 3 injections in the office it sounds like they may be SI joint injections.  She has been through physical therapy using Tylenol for pain.  She states she is not able to tolerate tramadol.  Her pain has persisted.  Past history of cerebral artery occlusion with CVA.  Positive for COPD, history of PVCs.  She relates her back and leg symptoms since the fall.  She has had x-rays available on epic from 02/06/2020 post fall.  Brain MRI 01/31/2019 showed no acute intracranial abnormality stable mild atrophy white matter changes and remote lacunar infarcts of the right  cerebellum stable.  Review of Systems All other systems are noncontributory other than as mentioned above.  Objective: Vital Signs: BP (!) 155/84   Pulse 92   Ht 5\' 3"  (1.6 m)   Wt 125 lb (56.7 kg)   BMI 22.14 kg/m   Physical Exam Constitutional:      Appearance: She is well-developed.  HENT:     Head: Normocephalic.     Right Ear: External ear normal.     Left Ear: External ear normal.  Eyes:     Pupils: Pupils are equal, round, and reactive to light.  Neck:     Thyroid: No thyromegaly.     Trachea: No tracheal deviation.  Cardiovascular:     Rate and Rhythm: Normal rate.  Pulmonary:     Effort: Pulmonary effort is normal.  Abdominal:     Palpations: Abdomen is soft.  Skin:    General: Skin is warm and dry.  Neurological:     Mental Status: She is alert and oriented to person, place, and time.  Psychiatric:        Behavior: Behavior normal.     Ortho Exam patient has some pain with straight leg raising on the left minimal discomfort internal and external rotation of the hip negative logroll.  Knee and ankle jerk are  intact anterior tib gastrocsoleus is strong.  She has palpable pedal pulse.  Specialty Comments:  No specialty comments available.  Imaging: CLINICAL DATA:  Follow-up low back pain, left low back pain. Non radiating.  EXAM: LUMBAR SPINE - COMPLETE 4+ VIEW  COMPARISON:  Lumbar spine radiographs 10/06/2015  FINDINGS: Mild scoliotic curvature. Stable minimal anterolisthesis of L5 on S1. Otherwise alignment intact. Vertebral body heights are maintained. Moderate intervertebral disc space loss at L1-2, similar to prior. Multilevel anterior spurring. Lower lumbar facet arthropathy. Aortic atherosclerotic disease. SI joints are open.  IMPRESSION: Moderate multilevel degenerative disc disease and facet arthropathy, mildly advanced from prior.   Electronically Signed   By: Audie Pinto M.D.   On: 02/06/2020 12:09   PMFS  History: Patient Active Problem List   Diagnosis Date Noted  . Iron deficiency 02/06/2020  . Labyrinthitis 12/08/2019  . Left-sided low back pain without sciatica 05/05/2019  . Visual loss, transient, bilateral 02/21/2019  . Essential hypertension 02/21/2019  . Anemia, iron deficiency 06/11/2018  . Colonic mass s/p lap ileocectomy 03/14/2018 03/14/2018  . Acute lower GI bleeding 03/14/2018  . Chronic anticoagulation 03/14/2018  . Chronic tension-type headache, not intractable 05/31/2015  . Symptomatic PVCs 08/04/2014  . Chest pain 08/03/2014  . TIA (transient ischemic attack) 08/26/2013  . Palpitations 07/09/2013  . Restless legs syndrome (RLS) 07/04/2012  . Cerebral artery occlusion with cerebral infarction ([place]) 03/30/2010  . Cerebrovascular disease 07/13/2009  . History of cerebral aneurysm 03/26/2009  . Vitamin D deficiency 08/10/2008  . Venous insufficiency 08/10/2008  . Disorder of bone and cartilage 12/18/2007  . 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[place]) 08/18/2007  . Hypothyroidism 08/18/2007  . Tobacco use disorder, continuous 08/18/2007  . Hyperlipidemia 07/18/2007  . Anxiety state 07/18/2007  . Mitral valve disorder 07/18/2007  . COPD (chronic obstructive pulmonary disease) with chronic bronchitis ([place]) 07/18/2007  . Irritable bowel syndrome 07/18/2007  . Fibromyalgia 07/18/2007   Past Medical History:  Diagnosis Date  . AN (acoustic neuroma) ([place])   . Aneurysm ([place])   . Anxiety   . Cataract   . Cerebrovascular disease   . Cigarette smoker   . COPD (chronic obstructive pulmonary disease) ([facility])    mild, no inhalers or oxygen used  . Coronary artery disease    no current cardiologist  pt. denies  . Deafness in right ear   . Depression   . Headache    tension headaches due to aneurysm repair with stent  . Heart murmur   . History of kidney stones    15 to 20 yrs ago  . Hyperlipidemia   . Hypothyroidism   . IBS (irritable bowel syndrome)   . Mitral valve  prolapse    mild takes beta blocker  . Osteopenia   . Palpitations   . Stroke ([place])    x 3 ministrokes last one feb 2014  . Venous insufficiency   . Vitamin D deficiency     Family History  Problem Relation Age of Onset  . Heart attack Mother   . Diabetes Mother   . Cancer Father   . Diabetes Sister   . Diabetes Brother   . Cancer Sister        unkown type  . Lung cancer Brother        Lung  . AAA (abdominal aortic aneurysm) Neg Hx   . Colon cancer Neg Hx   . Stomach cancer Neg Hx   . Esophageal cancer Neg Hx   . Liver cancer  Neg Hx   . Pancreatic cancer Neg Hx   . Rectal cancer Neg Hx     Past Surgical History:  Procedure Laterality Date  . ANEURYSM COILING  11-10   Dr. Estanislado Pandy  . BREAST LUMPECTOMY Right 1986   benign  . DIAGNOSTIC LAPAROSCOPY     Proable laparoscopic right colectomy 03-14-18 Dr. Excell Seltzer  . EYE SURGERY Bilateral    ioc lens for cataracts  . LAPAROSCOPIC RIGHT COLECTOMY Right 03/14/2018   Procedure: LAPAROSCOPIC ASSISTED  RIGHT COLECTOMY;  Surgeon: Excell Seltzer, MD;  Location: [facility];  Service: General;  Laterality: Right;  . LAPAROSCOPY N/A 03/14/2018   Procedure: LAPAROSCOPY;  Surgeon: Excell Seltzer, MD;  Location: [facility];  Service: General;  Laterality: N/A;  . LEFT HEART CATHETERIZATION WITH CORONARY ANGIOGRAM N/A 08/05/2014   Procedure: LEFT HEART CATHETERIZATION WITH CORONARY ANGIOGRAM;  Surgeon: Wellington Hampshire, MD;  Location: [facility];  Service: Cardiovascular;  Laterality: N/A;  . TONSILLECTOMY    . TOTAL ABDOMINAL HYSTERECTOMY  1970   complete  . TRANSLABYRINTHINE PROCEDURE  2002   [facility] Dr. Vicie Mutters tumor removal  . TRANSURETHRAL RESECTION OF BLADDER TUMOR N/A 02/08/2018   Procedure: TRANSURETHRAL RESECTION OF BLADDER TUMOR (TURBT)/ POSTOPERATIVE INSTILLATION OF CHEMO THERAPY;  Surgeon: Festus Aloe, MD;  Location: [facility];  Service: Urology;  Laterality: N/A;   Social History   Occupational History  .  Occupation: alterations  Tobacco Use  . Smoking status: Current Every Day Smoker    Packs/day: 0.25    Years: 50.00    Pack years: 12.50    Types: Cigarettes  . Smokeless tobacco: Never Used  . Tobacco comment: 4-5 cigs daily  is aware she needs to quit  Vaping Use  . Vaping Use: Never used  Substance and Sexual Activity  . Alcohol use: No    Alcohol/week: 0.0 standard drinks  . Drug use: No  . Sexual activity: Not Currently

## 2020-10-11 ENCOUNTER — Other Ambulatory Visit: Payer: Self-pay | Admitting: Family Medicine

## 2020-10-11 DIAGNOSIS — E039 Hypothyroidism, unspecified: Secondary | ICD-10-CM

## 2020-10-12 ENCOUNTER — Telehealth: Payer: Self-pay | Admitting: Family Medicine

## 2020-10-12 NOTE — Telephone Encounter (Signed)
Pt is wanting a prescription for a pain med. She does have a MRI scheduled for 10/23/20 for the pain in her back. She doesn't want Tramadol, nothing strong just something to help with the pain. Please advise pt at [phone].

## 2020-10-13 NOTE — Telephone Encounter (Signed)
May take tylenol. No narcotic medicines.

## 2020-10-13 NOTE — Telephone Encounter (Signed)
Spoke with patient who states that she is currently taking around 8-10 Tylenol daily to tr to help with the pain that she is having. She can not take Ibuprofen due to current medications wanting to know if there was something Dr. Ethelene Hal could send in for her. Please advise.

## 2020-10-23 ENCOUNTER — Other Ambulatory Visit: Payer: Self-pay

## 2020-10-23 ENCOUNTER — Ambulatory Visit
Admission: RE | Admit: 2020-10-23 | Discharge: 2020-10-23 | Disposition: A | Discharge status: Home / Self Care | Payer: Medicare Other | Source: Ambulatory Visit | Attending: Orthopaedic Surgery | Admitting: Orthopaedic Surgery

## 2020-10-23 DIAGNOSIS — M545 Low back pain, unspecified: Secondary | ICD-10-CM | POA: Diagnosis not present

## 2020-10-23 DIAGNOSIS — M25552 Pain in left hip: Secondary | ICD-10-CM

## 2020-10-23 DIAGNOSIS — M48061 Spinal stenosis, lumbar region without neurogenic claudication: Secondary | ICD-10-CM | POA: Diagnosis not present

## 2020-10-23 DIAGNOSIS — G8929 Other chronic pain: Secondary | ICD-10-CM

## 2020-10-25 ENCOUNTER — Other Ambulatory Visit: Payer: Self-pay | Admitting: Family Medicine

## 2020-10-25 DIAGNOSIS — I10 Essential (primary) hypertension: Secondary | ICD-10-CM

## 2020-10-27 ENCOUNTER — Other Ambulatory Visit: Payer: Self-pay

## 2020-10-27 ENCOUNTER — Encounter: Payer: Self-pay | Admitting: Orthopaedic Surgery

## 2020-10-27 ENCOUNTER — Ambulatory Visit (INDEPENDENT_AMBULATORY_CARE_PROVIDER_SITE_OTHER): Payer: Medicare Other | Admitting: Orthopaedic Surgery

## 2020-10-27 VITALS — BP 110/68 | HR 74 | Ht 62.5 in | Wt 121.0 lb

## 2020-10-27 DIAGNOSIS — M5442 Lumbago with sciatica, left side: Secondary | ICD-10-CM | POA: Diagnosis not present

## 2020-10-27 DIAGNOSIS — G8929 Other chronic pain: Secondary | ICD-10-CM | POA: Diagnosis not present

## 2020-10-27 DIAGNOSIS — I6350 Cerebral infarction due to unspecified occlusion or stenosis of unspecified cerebral artery: Secondary | ICD-10-CM

## 2020-10-28 NOTE — Progress Notes (Signed)
Office Visit Note   Patient: Ana Santos           Date of Birth: 1940-09-16           MRN: [id number] Visit Date: 10/27/2020              Requested by: Libby Maw, MD [address] [place],  [place] [address] PCP: Libby Maw, MD   Assessment & Plan: Visit Diagnoses:  1. Chronic left-sided low back pain with left-sided sciatica     Plan: MRI scan was reviewed there is no areas of severe compression.  We will set her up for epidural injection with Dr. Ernestina Patches and she can return to see me if she has persistent symptoms.  Follow-Up Instructions: No follow-ups on file.   Orders:  Orders Placed This Encounter  Procedures  . Ambulatory referral to Physical Medicine Rehab   No orders of the defined types were placed in this encounter.     Procedures: No procedures performed   Clinical Data: No additional findings.   Subjective: Chief Complaint  Patient presents with  . Lower Back - Follow-up    MRI lumbar spine review    HPI 80 year old female returns with ongoing pain with back pain difficulty walking more symptoms on her left leg than right leg.  She has had cortisone injections in the past as well as physical therapy without relief.  Cortisone injections were antibiotics.  She does have past history of cerebral artery occlusion with CVA.  Positive for COPD previous PVCs.  MRI scan lumbar has been obtained and is available for review.  Scan shows multilevel degenerative changes slightly progressed at L4-5.  Small left paracentral disc fusion L1-2 and some right foraminal stenosis at L5-S1 and this looks on the opposite side of her left-sided symptoms.  Review of Systems all other systems noncontributory to HPI.   Objective: Vital Signs: BP 110/68   Pulse 74   Ht 5' 2.5" (1.588 m)   Wt 121 lb (54.9 kg)   BMI 21.78 kg/m   Physical Exam Constitutional:      Appearance: She is well-developed.  HENT:     Head: Normocephalic.      Right Ear: External ear normal.     Left Ear: External ear normal.  Eyes:     Pupils: Pupils are equal, round, and reactive to light.  Neck:     Thyroid: No thyromegaly.     Trachea: No tracheal deviation.  Cardiovascular:     Rate and Rhythm: Normal rate.  Pulmonary:     Effort: Pulmonary effort is normal.  Abdominal:     Palpations: Abdomen is soft.  Skin:    General: Skin is warm and dry.  Neurological:     Mental Status: She is alert and oriented to person, place, and time.  Psychiatric:        Behavior: Behavior normal.     Ortho Exam patient is ambulatory she is able to heel and toe walk.  Knee and ankle jerk are intact.  Normal pedal pulses palpable right and left symmetrical.  Pain with straight leg raising on the left at 80 degrees.  Internal and external rotation rotation of her hips are 9 painful.  Negative FABER test.  Specialty Comments:  No specialty comments available.  Imaging: CLINICAL DATA:  Left-sided low back pain. History of fall in September of 2021  EXAM: MRI LUMBAR SPINE WITHOUT CONTRAST  TECHNIQUE: Multiplanar, multisequence MR imaging of the lumbar  spine was performed. No intravenous contrast was administered.  COMPARISON:  MRI 03/02/2020  FINDINGS: Segmentation:  Standard.  Alignment: Unchanged trace anterolisthesis L5 on S1. Slight dextrocurvature.  Vertebrae:  No fracture, evidence of discitis, or bone lesion.  Conus medullaris and cauda equina: Conus extends to the L1-2 level. Conus and cauda equina appear normal.  Paraspinal and other soft tissues: Probable bilateral renal cysts, unchanged.  Disc levels:  T12-L1: No significant disc protrusion, foraminal stenosis, or canal stenosis. Unchanged.  L1-L2: Disc height loss with mild circumferential disc bulge. New small left paracentral disc protrusion with slight inferior migration of disc material. No significant foraminal or canal stenosis.  L2-L3: Mild  circumferential disc bulge and left greater than right facet arthropathy resulting in mild left subarticular recess narrowing. No significant foraminal or canal stenosis. Unchanged.  L3-L4: Circumferential disc bulge with bilateral facet arthropathy and ligamentum flavum buckling. Findings result in mild left foraminal stenosis and mild left greater than right subarticular recess stenosis. No significant canal stenosis. Unchanged.  L4-L5: Circumferential disc bulge with bilateral facet arthropathy resulting in moderate right and mild left foraminal stenosis with mild bilateral subarticular recess stenosis. No significant canal stenosis. Findings have slightly progressed from prior.  L5-S1: Mild disc uncovering with mild diffuse disc bulge. Advanced right and mild left facet arthropathy. Similar degree of mild right foraminal stenosis. No canal stenosis. Unchanged.  IMPRESSION: 1. Multilevel degenerative changes of the lumbar spine, as described above, slightly progressed at L4-L5 with moderate right and mild left foraminal stenosis and mild bilateral subarticular recess stenosis. 2. New small left paracentral disc protrusion at L1-L2. 3. Similar degree of mild right foraminal stenosis at L5-S1 and mild left foraminal stenosis at L3-L4. 4. No significant canal stenosis at any level.   Electronically Signed   By: Davina Poke D.O.   On: 10/25/2020 09:22    PMFS History: Patient Active Problem List   Diagnosis Date Noted  . Iron deficiency 02/06/2020  . Labyrinthitis 12/08/2019  . Left-sided low back pain without sciatica 05/05/2019  . Visual loss, transient, bilateral 02/21/2019  . Essential hypertension 02/21/2019  . Anemia, iron deficiency 06/11/2018  . Colonic mass s/p lap ileocectomy 03/14/2018 03/14/2018  . Acute lower GI bleeding 03/14/2018  . Chronic anticoagulation 03/14/2018  . Chronic tension-type headache, not intractable 05/31/2015  . Symptomatic  PVCs 08/04/2014  . Chest pain 08/03/2014  . TIA (transient ischemic attack) 08/26/2013  . Palpitations 07/09/2013  . Restless legs syndrome (RLS) 07/04/2012  . Cerebral artery occlusion with cerebral infarction ([facility]) 03/30/2010  . Cerebrovascular disease 07/13/2009  . History of cerebral aneurysm 03/26/2009  . Vitamin D deficiency 08/10/2008  . Venous insufficiency 08/10/2008  . Disorder of bone and cartilage 12/18/2007  . 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[facility]) 08/18/2007  . Hypothyroidism 08/18/2007  . Tobacco use disorder, continuous 08/18/2007  . Hyperlipidemia 07/18/2007  . Anxiety state 07/18/2007  . Mitral valve disorder 07/18/2007  . COPD (chronic obstructive pulmonary disease) with chronic bronchitis ([facility]) 07/18/2007  . Irritable bowel syndrome 07/18/2007  . Fibromyalgia 07/18/2007   Past Medical History:  Diagnosis Date  . AN (acoustic neuroma) ([facility])   . Aneurysm ([facility])   . Anxiety   . Cataract   . Cerebrovascular disease   . Cigarette smoker   . COPD (chronic obstructive pulmonary disease) ([facility])    mild, no inhalers or oxygen used  . Coronary artery disease    no current cardiologist  pt. denies  . Deafness in right ear   .  Depression   . Headache    tension headaches due to aneurysm repair with stent  . Heart murmur   . History of kidney stones    15 to 20 yrs ago  . Hyperlipidemia   . Hypothyroidism   . IBS (irritable bowel syndrome)   . Mitral valve prolapse    mild takes beta blocker  . Osteopenia   . Palpitations   . Stroke ([facility])    x 3 ministrokes last one feb 2014  . Venous insufficiency   . Vitamin D deficiency     Family History  Problem Relation Age of Onset  . Heart attack Mother   . Diabetes Mother   . Cancer Father   . Diabetes Sister   . Diabetes Brother   . Cancer Sister        unkown type  . Lung cancer Brother        Lung  . AAA (abdominal aortic aneurysm) Neg Hx   . Colon cancer Neg Hx   . Stomach cancer Neg Hx   .  Esophageal cancer Neg Hx   . Liver cancer Neg Hx   . Pancreatic cancer Neg Hx   . Rectal cancer Neg Hx     Past Surgical History:  Procedure Laterality Date  . ANEURYSM COILING  11-10   Dr. Estanislado Pandy  . BREAST LUMPECTOMY Right 1986   benign  . DIAGNOSTIC LAPAROSCOPY     Proable laparoscopic right colectomy 03-14-18 Dr. Excell Seltzer  . EYE SURGERY Bilateral    ioc lens for cataracts  . LAPAROSCOPIC RIGHT COLECTOMY Right 03/14/2018   Procedure: LAPAROSCOPIC ASSISTED  RIGHT COLECTOMY;  Surgeon: Excell Seltzer, MD;  Location: [facility];  Service: General;  Laterality: Right;  . LAPAROSCOPY N/A 03/14/2018   Procedure: LAPAROSCOPY;  Surgeon: Excell Seltzer, MD;  Location: [facility];  Service: General;  Laterality: N/A;  . LEFT HEART CATHETERIZATION WITH CORONARY ANGIOGRAM N/A 08/05/2014   Procedure: LEFT HEART CATHETERIZATION WITH CORONARY ANGIOGRAM;  Surgeon: Wellington Hampshire, MD;  Location: [facility];  Service: Cardiovascular;  Laterality: N/A;  . TONSILLECTOMY    . TOTAL ABDOMINAL HYSTERECTOMY  1970   complete  . TRANSLABYRINTHINE PROCEDURE  2002   [facility] Dr. Vicie Mutters tumor removal  . TRANSURETHRAL RESECTION OF BLADDER TUMOR N/A 02/08/2018   Procedure: TRANSURETHRAL RESECTION OF BLADDER TUMOR (TURBT)/ POSTOPERATIVE INSTILLATION OF CHEMO THERAPY;  Surgeon: Festus Aloe, MD;  Location: [facility];  Service: Urology;  Laterality: N/A;   Social History   Occupational History  . Occupation: alterations  Tobacco Use  . Smoking status: Current Every Day Smoker    Packs/day: 0.25    Years: 50.00    Pack years: 12.50    Types: Cigarettes  . Smokeless tobacco: Never Used  . Tobacco comment: 4-5 cigs daily  is aware she needs to quit  Vaping Use  . Vaping Use: Never used  Substance and Sexual Activity  . Alcohol use: No    Alcohol/week: 0.0 standard drinks  . Drug use: No  . Sexual activity: Not Currently

## 2020-11-04 DIAGNOSIS — G2581 Restless legs syndrome: Secondary | ICD-10-CM | POA: Diagnosis not present

## 2020-11-08 ENCOUNTER — Telehealth: Payer: Self-pay | Admitting: Family Medicine

## 2020-11-08 DIAGNOSIS — G2581 Restless legs syndrome: Secondary | ICD-10-CM

## 2020-11-10 ENCOUNTER — Ambulatory Visit (INDEPENDENT_AMBULATORY_CARE_PROVIDER_SITE_OTHER): Payer: Medicare Other | Admitting: Physical Medicine and Rehabilitation

## 2020-11-10 ENCOUNTER — Encounter: Payer: Self-pay | Admitting: Physical Medicine and Rehabilitation

## 2020-11-10 ENCOUNTER — Ambulatory Visit: Payer: Self-pay

## 2020-11-10 ENCOUNTER — Other Ambulatory Visit: Payer: Self-pay

## 2020-11-10 VITALS — BP 164/73 | HR 96

## 2020-11-10 DIAGNOSIS — M5416 Radiculopathy, lumbar region: Secondary | ICD-10-CM

## 2020-11-10 MED ORDER — METHYLPREDNISOLONE ACETATE 80 MG/ML IJ SUSP
80.0000 mg | Freq: Once | INTRAMUSCULAR | Status: AC
  Administered 2020-11-10: 80 mg

## 2020-11-10 NOTE — Progress Notes (Signed)
Ana Santos - 80 y.o. female MRN [id number]  Date of birth: 1941/04/06  Office Visit Note: Visit Date: 11/10/2020 PCP: Libby Maw, MD Referred by: Libby Maw,*  Subjective: Chief Complaint  Patient presents with  . Lower Back - Pain   HPI:  Ana Santos is a 80 y.o. female who comes in today at the request of Dr. Rodell Perna for planned Left L3 Lumbar Transforaminal epidural steroid injection with fluoroscopic guidance.  The patient has failed conservative care including home exercise, medications, time and activity modification.  This injection will be diagnostic and hopefully therapeutic.  Please see requesting physician notes for further details and justification. MRI reviewed with images and spine model.  MRI reviewed in the note below.  ROS Otherwise per HPI.   Assessment & Plan: Visit Diagnoses:    ICD-10-CM   1. Lumbar radiculopathy  M54.16 XR C-ARM NO REPORT    Epidural Steroid injection    methylPREDNISolone acetate (DEPO-MEDROL) injection 80 mg    Plan: No additional findings.   Meds & Orders:  Meds ordered this encounter  Medications  . methylPREDNISolone acetate (DEPO-MEDROL) injection 80 mg    Orders Placed This Encounter  Procedures  . XR C-ARM NO REPORT  . Epidural Steroid injection    Follow-up: No follow-ups on file.   Procedures: No procedures performed  Lumbosacral Transforaminal Epidural Steroid Injection - Sub-Pedicular Approach with Fluoroscopic Guidance  Patient: Ana Santos      Date of Birth: 05-14-1941 MRN: [id number] PCP: Libby Maw, MD      Visit Date: 11/10/2020   Universal Protocol:    Date/Time: 11/10/2020  Consent Given By: the patient  Position: PRONE  Additional Comments: Vital signs were monitored before and after the procedure. Patient was prepped and draped in the usual sterile fashion. The correct patient, procedure, and site was verified.   Injection Procedure  Details:   Procedure diagnoses: Lumbar radiculopathy [M54.16]    Meds Administered: No orders of the defined types were placed in this encounter.   Laterality: Left  Location/Site:  L3-L4  Needle:5.0 in., 22 ga.  Short bevel or Quincke spinal needle  Needle Placement: Transforaminal  Findings:    -Comments: Excellent flow of contrast along the nerve, nerve root and into the epidural space.  Procedure Details: After squaring off the end-plates to get a true AP view, the C-arm was positioned so that an oblique view of the foramen as noted above was visualized. The target area is just inferior to the "nose of the scotty dog" or sub pedicular. The soft tissues overlying this structure were infiltrated with 2-3 ml. of 1% Lidocaine without Epinephrine.  The spinal needle was inserted toward the target using a "trajectory" view along the fluoroscope beam.  Under AP and lateral visualization, the needle was advanced so it did not puncture dura and was located close the 6 O'Clock position of the pedical in AP tracterory. Biplanar projections were used to confirm position. Aspiration was confirmed to be negative for CSF and/or blood. A 1-2 ml. volume of Isovue-250 was injected and flow of contrast was noted at each level. Radiographs were obtained for documentation purposes.   After attaining the desired flow of contrast documented above, a 0.5 to 1.0 ml test dose of 0.25% Marcaine was injected into each respective transforaminal space.  The patient was observed for 90 seconds post injection.  After no sensory deficits were reported, and normal lower extremity motor function was noted,  the above injectate was administered so that equal amounts of the injectate were placed at each foramen (level) into the transforaminal epidural space.   Additional Comments:  The patient tolerated the procedure well Dressing: 2 x 2 sterile gauze and Band-Aid    Post-procedure details: Patient was observed  during the procedure. Post-procedure instructions were reviewed.  Patient left the clinic in stable condition.      Clinical History: MRI LUMBAR SPINE WITHOUT CONTRAST  TECHNIQUE: Multiplanar, multisequence MR imaging of the lumbar spine was performed. No intravenous contrast was administered.  COMPARISON:  MRI 03/02/2020  FINDINGS: Segmentation:  Standard.  Alignment: Unchanged trace anterolisthesis L5 on S1. Slight dextrocurvature.  Vertebrae:  No fracture, evidence of discitis, or bone lesion.  Conus medullaris and cauda equina: Conus extends to the L1-2 level. Conus and cauda equina appear normal.  Paraspinal and other soft tissues: Probable bilateral renal cysts, unchanged.  Disc levels:  T12-L1: No significant disc protrusion, foraminal stenosis, or canal stenosis. Unchanged.  L1-L2: Disc height loss with mild circumferential disc bulge. New small left paracentral disc protrusion with slight inferior migration of disc material. No significant foraminal or canal stenosis.  L2-L3: Mild circumferential disc bulge and left greater than right facet arthropathy resulting in mild left subarticular recess narrowing. No significant foraminal or canal stenosis. Unchanged.  L3-L4: Circumferential disc bulge with bilateral facet arthropathy and ligamentum flavum buckling. Findings result in mild left foraminal stenosis and mild left greater than right subarticular recess stenosis. No significant canal stenosis. Unchanged.  L4-L5: Circumferential disc bulge with bilateral facet arthropathy resulting in moderate right and mild left foraminal stenosis with mild bilateral subarticular recess stenosis. No significant canal stenosis. Findings have slightly progressed from prior.  L5-S1: Mild disc uncovering with mild diffuse disc bulge. Advanced right and mild left facet arthropathy. Similar degree of mild right foraminal stenosis. No canal stenosis.  Unchanged.  IMPRESSION: 1. Multilevel degenerative changes of the lumbar spine, as described above, slightly progressed at L4-L5 with moderate right and mild left foraminal stenosis and mild bilateral subarticular recess stenosis. 2. New small left paracentral disc protrusion at L1-L2. 3. Similar degree of mild right foraminal stenosis at L5-S1 and mild left foraminal stenosis at L3-L4. 4. No significant canal stenosis at any level.   Electronically Signed   By: Davina Poke D.O.   On: 10/25/2020 09:22     Objective:  VS:  HT:    WT:   BMI:     BP:(!) 164/73  HR:96bpm  TEMP: ( )  RESP:  Physical Exam Vitals and nursing note reviewed.  Constitutional:      General: She is not in acute distress.    Appearance: Normal appearance. She is not ill-appearing.  HENT:     Head: Normocephalic and atraumatic.     Right Ear: External ear normal.     Left Ear: External ear normal.  Eyes:     Extraocular Movements: Extraocular movements intact.  Cardiovascular:     Rate and Rhythm: Normal rate.     Pulses: Normal pulses.  Pulmonary:     Effort: Pulmonary effort is normal. No respiratory distress.  Abdominal:     General: There is no distension.     Palpations: Abdomen is soft.  Musculoskeletal:        General: Tenderness present.     Cervical back: Neck supple.     Right lower leg: No edema.     Left lower leg: No edema.     Comments: Patient  has good distal strength with no pain over the greater trochanters.  No clonus or focal weakness.  Skin:    Findings: No erythema, lesion or rash.  Neurological:     General: No focal deficit present.     Mental Status: She is alert and oriented to person, place, and time.     Sensory: No sensory deficit.     Motor: No weakness or abnormal muscle tone.     Coordination: Coordination normal.  Psychiatric:        Mood and Affect: Mood normal.        Behavior: Behavior normal.      Imaging: No results found.

## 2020-11-10 NOTE — Patient Instructions (Signed)

## 2020-11-10 NOTE — Progress Notes (Signed)
Pt state lower back pain pain that travels to her buttocks. Pt state walking and sitting for a long time makes the pain worse. Pt state laying down cause some pain. Pt state takes pain meds and heating pads to help ease her pain.  Numeric Pain Rating Scale and Functional Assessment Average Pain 10   In the last MONTH (on 0-10 scale) has pain interfered with the following?  1. General activity like being  able to carry out your everyday physical activities such as walking, climbing stairs, carrying groceries, or moving a chair?  Rating(10)   +Driver, -BT, +Dye Allergies.

## 2020-11-10 NOTE — Procedures (Signed)
Lumbosacral Transforaminal Epidural Steroid Injection - Sub-Pedicular Approach with Fluoroscopic Guidance  Patient: Ana Santos      Date of Birth: [birth year] MRN: [id number] PCP: Libby Maw, MD      Visit Date: 11/10/2020   Universal Protocol:    Date/Time: 11/10/2020  Consent Given By: the patient  Position: PRONE  Additional Comments: Vital signs were monitored before and after the procedure. Patient was prepped and draped in the usual sterile fashion. The correct patient, procedure, and site was verified.   Injection Procedure Details:   Procedure diagnoses: Lumbar radiculopathy [M54.16]    Meds Administered: No orders of the defined types were placed in this encounter.   Laterality: Left  Location/Site:  L3-L4  Needle:5.0 in., 22 ga.  Short bevel or Quincke spinal needle  Needle Placement: Transforaminal  Findings:    -Comments: Excellent flow of contrast along the nerve, nerve root and into the epidural space.  Procedure Details: After squaring off the end-plates to get a true AP view, the C-arm was positioned so that an oblique view of the foramen as noted above was visualized. The target area is just inferior to the "nose of the scotty dog" or sub pedicular. The soft tissues overlying this structure were infiltrated with 2-3 ml. of 1% Lidocaine without Epinephrine.  The spinal needle was inserted toward the target using a "trajectory" view along the fluoroscope beam.  Under AP and lateral visualization, the needle was advanced so it did not puncture dura and was located close the 6 O'Clock position of the pedical in AP tracterory. Biplanar projections were used to confirm position. Aspiration was confirmed to be negative for CSF and/or blood. A 1-2 ml. volume of Isovue-250 was injected and flow of contrast was noted at each level. Radiographs were obtained for documentation purposes.   After attaining the desired flow of contrast documented above,  a 0.5 to 1.0 ml test dose of 0.25% Marcaine was injected into each respective transforaminal space.  The patient was observed for 90 seconds post injection.  After no sensory deficits were reported, and normal lower extremity motor function was noted,   the above injectate was administered so that equal amounts of the injectate were placed at each foramen (level) into the transforaminal epidural space.   Additional Comments:  The patient tolerated the procedure well Dressing: 2 x 2 sterile gauze and Band-Aid    Post-procedure details: Patient was observed during the procedure. Post-procedure instructions were reviewed.  Patient left the clinic in stable condition.

## 2020-11-16 NOTE — Telephone Encounter (Signed)
Patient is calling to get a refill on her restless leg medication. Please send to [facility] in [place]. She states that she only has one pill left.

## 2020-11-17 MED ORDER — PRAMIPEXOLE DIHYDROCHLORIDE 0.5 MG PO TABS: ORAL_TABLET | ORAL | 0 refills | Status: DC

## 2020-11-17 NOTE — Addendum Note (Signed)
Addended by: Lynda Rainwater on: 11/17/2020 01:58 PM   Modules accepted: Orders

## 2020-11-17 NOTE — Telephone Encounter (Signed)
Pt states that she is begging for someone to call her back today to notify if Dr. Ethelene Hal will fill this medication. She is out and has terrible restless leg. She needs to know if she has to go to [facility] to get medications if her PCP won't order for her.   [place]

## 2020-11-17 NOTE — Telephone Encounter (Signed)
Returned patients call no answer LMTCB 

## 2020-11-17 NOTE — Telephone Encounter (Signed)
Refill sent in

## 2020-11-24 ENCOUNTER — Encounter: Payer: Self-pay | Admitting: Orthopaedic Surgery

## 2020-11-24 ENCOUNTER — Other Ambulatory Visit: Payer: Self-pay

## 2020-11-24 ENCOUNTER — Ambulatory Visit (INDEPENDENT_AMBULATORY_CARE_PROVIDER_SITE_OTHER): Payer: Medicare Other | Admitting: Orthopaedic Surgery

## 2020-11-24 VITALS — BP 128/82 | HR 86 | Ht 62.5 in | Wt 121.0 lb

## 2020-11-24 DIAGNOSIS — G8929 Other chronic pain: Secondary | ICD-10-CM | POA: Diagnosis not present

## 2020-11-24 DIAGNOSIS — M5459 Other low back pain: Secondary | ICD-10-CM | POA: Diagnosis not present

## 2020-11-24 DIAGNOSIS — I6350 Cerebral infarction due to unspecified occlusion or stenosis of unspecified cerebral artery: Secondary | ICD-10-CM

## 2020-11-24 DIAGNOSIS — M545 Low back pain, unspecified: Secondary | ICD-10-CM

## 2020-11-24 NOTE — Progress Notes (Signed)
Office Visit Note   Patient: Ana Santos           Date of Birth: 02-11-1941           MRN: [id number] Visit Date: 11/24/2020              Requested by: Libby Maw, MD [address] [place],  [place] [address] PCP: Libby Maw, MD   Assessment & Plan: Visit Diagnoses:  1. Chronic left-sided low back pain without sciatica     Plan: Reviewed diagnostic studies patient's had lab work plain radiographs multiple exams.  Would recommend some pool therapy to see if she could get some relief.  This appears new musculoskeletal and epidurals have not given her relief.  Again we discussed smoking sensation with history of CVAs.  We can recheck her in 2 months.  Follow-Up Instructions: Return in about 2 months (around 01/24/2021).   Orders:  No orders of the defined types were placed in this encounter.  No orders of the defined types were placed in this encounter.     Procedures: No procedures performed   Clinical Data: No additional findings.   Subjective: Chief Complaint  Patient presents with   Lower Back - Pain, Follow-up    HPI 80 year old female returns with ongoing back pain.  She is used Tylenol with minimal relief.  Past history of taking Ultram and when she stopped she had significant withdrawal symptoms.  Epidural gave her some relief short-term.  She states the pain that she has been is increased her anxiety.  Past history of CVA, COPD still smoking.  Paracentral disc at L1-2 which is very small floated to the left.  She has some right foraminal stenosis L5-S1 opposite side from her left-sided symptoms.  Pinpoints right paralumbar region where she has pain.  Patient has pain with standing sitting and supine position.  She cannot lay on her left side.  Patient is used Tylenol emu spray, patches without relief.  Review of Systems all systems updated.  No fever or chills no history of gout.  Previous lab work was reviewed.  History of  cerebral aneurysm, hypertension.  Objective: Vital Signs: BP 128/82   Pulse 86   Ht 5' 2.5" (1.588 m)   Wt 121 lb (54.9 kg)   BMI 21.78 kg/m   Physical Exam Constitutional:      Appearance: She is well-developed.  HENT:     Head: Normocephalic.     Right Ear: External ear normal.     Left Ear: External ear normal. There is no impacted cerumen.  Eyes:     Pupils: Pupils are equal, round, and reactive to light.  Neck:     Thyroid: No thyromegaly.     Trachea: No tracheal deviation.  Cardiovascular:     Rate and Rhythm: Normal rate.  Pulmonary:     Effort: Pulmonary effort is normal.  Abdominal:     Palpations: Abdomen is soft.  Musculoskeletal:     Cervical back: No rigidity.  Skin:    General: Skin is warm and dry.  Neurological:     Mental Status: She is alert and oriented to person, place, and time.  Psychiatric:        Behavior: Behavior normal.    Ortho Exam patient can ambulate on her heels and her toes reflexes are 2+ and symmetrical.  Negative logroll of the hips.  FABER test negative.  Specialty Comments:  No specialty comments available.  Imaging: No results  found.   PMFS History: Patient Active Problem List   Diagnosis Date Noted   Iron deficiency 02/06/2020   Labyrinthitis 12/08/2019   Left-sided low back pain without sciatica 05/05/2019   Visual loss, transient, bilateral 02/21/2019   Essential hypertension 02/21/2019   Anemia, iron deficiency 06/11/2018   Colonic mass s/p lap ileocectomy 03/14/2018 03/14/2018   Acute lower GI bleeding 03/14/2018   Chronic anticoagulation 03/14/2018   Chronic tension-type headache, not intractable 05/31/2015   Symptomatic PVCs 08/04/2014   Chest pain 08/03/2014   TIA (transient ischemic attack) 08/26/2013   Palpitations 07/09/2013   Restless legs syndrome (RLS) 07/04/2012   Cerebral artery occlusion with cerebral infarction ([facility]) 03/30/2010   Cerebrovascular disease 07/13/2009   History of cerebral aneurysm  03/26/2009   Vitamin D deficiency 08/10/2008   Venous insufficiency 08/10/2008   Disorder of bone and cartilage 12/18/2007   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[place]) 08/18/2007   Hypothyroidism 08/18/2007   Tobacco use disorder, continuous 08/18/2007   Hyperlipidemia 07/18/2007   Anxiety state 07/18/2007   Mitral valve disorder 07/18/2007   COPD (chronic obstructive pulmonary disease) with chronic bronchitis ([facility]) 07/18/2007   Irritable bowel syndrome 07/18/2007   Fibromyalgia 07/18/2007   Past Medical History:  Diagnosis Date   AN (acoustic neuroma) ([facility])    Aneurysm ([facility])    Anxiety    Cataract    Cerebrovascular disease    Cigarette smoker    COPD (chronic obstructive pulmonary disease) ([facility])    mild, no inhalers or oxygen used   Coronary artery disease    no current cardiologist  pt. denies   Deafness in right ear    Depression    Headache    tension headaches due to aneurysm repair with stent   Heart murmur    History of kidney stones    15 to 20 yrs ago   Hyperlipidemia    Hypothyroidism    IBS (irritable bowel syndrome)    Mitral valve prolapse    mild takes beta blocker   Osteopenia    Palpitations    Stroke ([facility])    x 3 ministrokes last one feb 2014   Venous insufficiency    Vitamin D deficiency     Family History  Problem Relation Age of Onset   Heart attack Mother    Diabetes Mother    Cancer Father    Diabetes Sister    Diabetes Brother    Cancer Sister        unkown type   Lung cancer Brother        Lung   AAA (abdominal aortic aneurysm) Neg Hx    Colon cancer Neg Hx    Stomach cancer Neg Hx    Esophageal cancer Neg Hx    Liver cancer Neg Hx    Pancreatic cancer Neg Hx    Rectal cancer Neg Hx     Past Surgical History:  Procedure Laterality Date   ANEURYSM COILING  11-10   Dr. Estanislado Pandy   BREAST LUMPECTOMY Right 1986   benign   DIAGNOSTIC LAPAROSCOPY     Proable laparoscopic right colectomy 03-14-18 Dr. Excell Seltzer   EYE SURGERY Bilateral     ioc lens for cataracts   LAPAROSCOPIC RIGHT COLECTOMY Right 03/14/2018   Procedure: LAPAROSCOPIC ASSISTED  RIGHT COLECTOMY;  Surgeon: Excell Seltzer, MD;  Location: [facility];  Service: General;  Laterality: Right;   LAPAROSCOPY N/A 03/14/2018   Procedure: LAPAROSCOPY;  Surgeon: Excell Seltzer, MD;  Location: [facility];  Service:  General;  Laterality: N/A;   LEFT HEART CATHETERIZATION WITH CORONARY ANGIOGRAM N/A 08/05/2014   Procedure: LEFT HEART CATHETERIZATION WITH CORONARY ANGIOGRAM;  Surgeon: Wellington Hampshire, MD;  Location: [facility];  Service: Cardiovascular;  Laterality: N/A;   TONSILLECTOMY     TOTAL ABDOMINAL HYSTERECTOMY  1970   complete   TRANSLABYRINTHINE PROCEDURE  2002   [facility] Dr. Vicie Mutters tumor removal   TRANSURETHRAL RESECTION OF BLADDER TUMOR N/A 02/08/2018   Procedure: TRANSURETHRAL RESECTION OF BLADDER TUMOR (TURBT)/ POSTOPERATIVE INSTILLATION OF CHEMO THERAPY;  Surgeon: Festus Aloe, MD;  Location: [facility];  Service: Urology;  Laterality: N/A;   Social History   Occupational History   Occupation: alterations  Tobacco Use   Smoking status: Every Day    Packs/day: 0.25    Years: 50.00    Pack years: 12.50    Types: Cigarettes   Smokeless tobacco: Never   Tobacco comments:    4-5 cigs daily  is aware she needs to quit  Vaping Use   Vaping Use: Never used  Substance and Sexual Activity   Alcohol use: No    Alcohol/week: 0.0 standard drinks   Drug use: No   Sexual activity: Not Currently

## 2020-11-29 ENCOUNTER — Encounter: Payer: Self-pay | Admitting: Family Medicine

## 2020-11-29 ENCOUNTER — Other Ambulatory Visit: Payer: Self-pay

## 2020-11-29 ENCOUNTER — Ambulatory Visit (INDEPENDENT_AMBULATORY_CARE_PROVIDER_SITE_OTHER): Payer: Medicare Other | Admitting: Family Medicine

## 2020-11-29 VITALS — BP 118/72 | HR 66 | Temp 97.6°F | Ht 62.0 in | Wt 125.4 lb

## 2020-11-29 DIAGNOSIS — E039 Hypothyroidism, unspecified: Secondary | ICD-10-CM | POA: Diagnosis not present

## 2020-11-29 DIAGNOSIS — E782 Mixed hyperlipidemia: Secondary | ICD-10-CM

## 2020-11-29 DIAGNOSIS — E559 Vitamin D deficiency, unspecified: Secondary | ICD-10-CM | POA: Diagnosis not present

## 2020-11-29 DIAGNOSIS — G2581 Restless legs syndrome: Secondary | ICD-10-CM

## 2020-11-29 DIAGNOSIS — I10 Essential (primary) hypertension: Secondary | ICD-10-CM | POA: Diagnosis not present

## 2020-11-29 DIAGNOSIS — F17209 Nicotine dependence, unspecified, with unspecified nicotine-induced disorders: Secondary | ICD-10-CM

## 2020-11-29 LAB — COMPREHENSIVE METABOLIC PANEL
ALT: 10 U/L (ref 0–35)
AST: 15 U/L (ref 0–37)
Albumin: 4.4 g/dL (ref 3.5–5.2)
Alkaline Phosphatase: 91 U/L (ref 39–117)
BUN: 17 mg/dL (ref 6–23)
CO2: 24 mEq/L (ref 19–32)
Calcium: 9.6 mg/dL (ref 8.4–10.5)
Chloride: 108 mEq/L (ref 96–112)
Creatinine, Ser: 1 mg/dL (ref 0.40–1.20)
GFR: 53.43 mL/min — ABNORMAL LOW (ref >60.00)
Glucose, Bld: 81 mg/dL (ref 70–99)
Potassium: 4.2 mEq/L (ref 3.5–5.1)
Sodium: 141 mEq/L (ref 135–145)
Total Bilirubin: 0.4 mg/dL (ref 0.2–1.2)
Total Protein: 7.3 g/dL (ref 6.0–8.3)

## 2020-11-29 LAB — CBC
HCT: 41.6 % (ref 36.0–46.0)
Hemoglobin: 13.8 g/dL (ref 12.0–15.0)
MCHC: 33.1 g/dL (ref 30.0–36.0)
MCV: 89.9 fl (ref 78.0–100.0)
Platelets: 305 10*3/uL (ref 150.0–400.0)
RBC: 4.63 Mil/uL (ref 3.87–5.11)
RDW: 14.6 % (ref 11.5–15.5)
WBC: 10.9 10*3/uL — ABNORMAL HIGH (ref 4.0–10.5)

## 2020-11-29 LAB — LIPID PANEL
Cholesterol: 194 mg/dL (ref 0–200)
HDL: 61 mg/dL (ref >39.00)
LDL Cholesterol: 95 mg/dL (ref 0–99)
NonHDL: 133.29
Total CHOL/HDL Ratio: 3
Triglycerides: 193 mg/dL — ABNORMAL HIGH (ref 0.0–149.0)
VLDL: 38.6 mg/dL (ref 0.0–40.0)

## 2020-11-29 LAB — VITAMIN D 25 HYDROXY (VIT D DEFICIENCY, FRACTURES): VITD: 48.36 ng/mL (ref 30.00–100.00)

## 2020-11-29 LAB — TSH: TSH: 6.17 u[IU]/mL — ABNORMAL HIGH (ref 0.35–4.50)

## 2020-11-29 MED ORDER — PRAMIPEXOLE DIHYDROCHLORIDE 0.5 MG PO TABS
ORAL_TABLET | ORAL | 4 refills | Status: DC
Start: 2020-11-29 — End: 2022-01-25

## 2020-11-29 NOTE — Progress Notes (Addendum)
Established Patient Office Visit  Subjective:  Patient ID: Ana Santos, female    DOB: 1940-10-27  Age: 80 y.o. MRN: [id number]  CC:  Chief Complaint  Patient presents with   Follow-up    3 month follow up, no concerns.     HPI [name] presents for follow-up of hypertension, elevated cholesterol, hypothyroidism, restless leg syndrome and tobacco use.  Patient continues to smoke a few cigarettes daily.  She visits her self with crochet and walking in the yard to the extent her back will allow to avoid smoking.  She is trying to quit.  Blood pressure cholesterol well controlled with amlodipine and pravastatin.  Continues high-dose vitamin D weekly pill.  Lower back is doing better with aquatic therapy and epidural injections.  Energy levels have improved.  Past Medical History:  Diagnosis Date   AN (acoustic neuroma) ([facility])    Aneurysm ([facility])    Anxiety    Cataract    Cerebrovascular disease    Cigarette smoker    COPD (chronic obstructive pulmonary disease) ([facility])    mild, no inhalers or oxygen used   Coronary artery disease    no current cardiologist  pt. denies   Deafness in right ear    Depression    Headache    tension headaches due to aneurysm repair with stent   Heart murmur    History of kidney stones    15 to 20 yrs ago   Hyperlipidemia    Hypothyroidism    IBS (irritable bowel syndrome)    Mitral valve prolapse    mild takes beta blocker   Osteopenia    Palpitations    Stroke ([place])    x 3 ministrokes last one feb 2014   Venous insufficiency    Vitamin D deficiency     Past Surgical History:  Procedure Laterality Date   ANEURYSM COILING  11-10   Dr. Estanislado Pandy   BREAST LUMPECTOMY Right 1986   benign   DIAGNOSTIC LAPAROSCOPY     Proable laparoscopic right colectomy 03-14-18 Dr. Excell Seltzer   EYE SURGERY Bilateral    ioc lens for cataracts   LAPAROSCOPIC RIGHT COLECTOMY Right 03/14/2018   Procedure: LAPAROSCOPIC ASSISTED  RIGHT COLECTOMY;   Surgeon: Excell Seltzer, MD;  [facility];  Service: General;  Laterality: Right;   LAPAROSCOPY N/A 03/14/2018   Procedure: LAPAROSCOPY;  Surgeon: Excell Seltzer, MD;  [facility];  Service: General;  Laterality: N/A;   LEFT HEART CATHETERIZATION WITH CORONARY ANGIOGRAM N/A 08/05/2014   Procedure: LEFT HEART CATHETERIZATION WITH CORONARY ANGIOGRAM;  Surgeon: Wellington Hampshire, MD;  Location: [facility];  Service: Cardiovascular;  Laterality: N/A;   TONSILLECTOMY     TOTAL ABDOMINAL HYSTERECTOMY  1970   complete   TRANSLABYRINTHINE PROCEDURE  2002   [facility] Dr. Vicie Mutters tumor removal   TRANSURETHRAL RESECTION OF BLADDER TUMOR N/A 02/08/2018   Procedure: TRANSURETHRAL RESECTION OF BLADDER TUMOR (TURBT)/ POSTOPERATIVE INSTILLATION OF CHEMO THERAPY;  Surgeon: Festus Aloe, MD;  Location: [facility];  Service: Urology;  Laterality: N/A;    Family History  Problem Relation Age of Onset   Heart attack Mother    Diabetes Mother    Cancer Father    Diabetes Sister    Diabetes Brother    Cancer Sister        unkown type   Lung cancer Brother        Lung   AAA (abdominal aortic aneurysm) Neg Hx  Colon cancer Neg Hx    Stomach cancer Neg Hx    Esophageal cancer Neg Hx    Liver cancer Neg Hx    Pancreatic cancer Neg Hx    Rectal cancer Neg Hx     Social History   Socioeconomic History   Marital status: Married    Spouse name: Not on file   Number of children: Not on file   Years of education: Not on file   Highest education level: Not on file  Occupational History   Occupation: alterations  Tobacco Use   Smoking status: Every Day    Packs/day: 0.25    Years: 50.00    Pack years: 12.50    Types: Cigarettes   Smokeless tobacco: Never   Tobacco comments:    4-5 cigs daily  is aware she needs to quit  Vaping Use   Vaping Use: Never used  Substance and Sexual Activity   Alcohol use: No    Alcohol/week: 0.0 standard drinks   Drug use: No    Sexual activity: Not Currently  Other Topics Concern   Not on file  Social History Narrative   Pt is married, 5 sons (4 living), 14 grandchildren, 4 great grandchildren.   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   Social Determinants of Sales executive: Low Risk    Difficulty of Paying Living Expenses: Not hard at [employer] Insecurity: No Food Insecurity   Worried About Charity fundraiser in the Last Year: Never true   Arboriculturist in the Last Year: Never true  Transportation Needs: No Data processing manager (Medical): No   Lack of Transportation (Non-Medical): No  Physical Activity: Inactive   Days of Exercise per Week: 0 days   Minutes of Exercise per Session: 0 min  Stress: No Stress Concern Present   Feeling of Stress : Not at all  Social Connections: Moderately Isolated   Frequency of Communication with Friends and Family: More than three times a week   Frequency of Social Gatherings with Friends and Family: Once a week   Attends Religious Services: Never   Marine scientist or Organizations: No   Attends Music therapist: Never   Marital Status: Married  Human resources officer Violence: Not At Risk   Fear of Current or Ex-Partner: No   Emotionally Abused: No   Physically Abused: No   Sexually Abused: No    Outpatient Medications Prior to Visit  Medication Sig Dispense Refill   acetaminophen (TYLENOL) 500 MG tablet Take 500 mg by mouth every 6 (six) hours as needed for mild pain.     amLODipine (NORVASC) 5 MG tablet TAKE 1 TABLET DAILY 90 tablet 0   clopidogrel (PLAVIX) 75 MG tablet TAKE 1 TABLET DAILY 90 tablet 3   hydroxypropyl methylcellulose / hypromellose (ISOPTO TEARS / GONIOVISC) 2.5 % ophthalmic solution Place 1 drop into both eyes 2 (two) times daily.     levothyroxine (SYNTHROID) 75 MCG tablet TAKE 1 TABLET DAILY BEFORE BREAKFAST (NEED FOLLOW UP VISIT WITH PROVIDER)  90 tablet 3   pravastatin (PRAVACHOL) 40 MG tablet TAKE 1 TABLET DAILY 90 tablet 3   Vitamin D, Ergocalciferol, (DRISDOL) 1.25 MG (50000 UNIT) CAPS capsule Take 1 capsule (50,000 Units total) by mouth every 7 (seven) days. 5 capsule 3   pramipexole (MIRAPEX) 0.5 MG tablet Take one nightly one hour prior to going to bed  as needed for restless leg syndrome. 30 tablet 0   No facility-administered medications prior to visit.    Allergies  Allergen Reactions   Atorvastatin Other (See Comments)     Lipitor caused arm pain (3/09)   Hydrocodone-Acetaminophen Other (See Comments)    hallucinations   Morphine Other (See Comments)    hallucinations   Nortriptyline     Caused nausea and vomiting and shaking, kept her up all night   Topamax [Topiramate] Nausea And Vomiting    *dizziness*   Tramadol     ROS Review of Systems  Constitutional: Negative.  Negative for fatigue.  HENT: Negative.    Eyes: Negative.   Respiratory: Negative.    Cardiovascular: Negative.   Gastrointestinal: Negative.   Endocrine: Negative for cold intolerance and heat intolerance.  Genitourinary: Negative.   Musculoskeletal:  Negative for gait problem and joint swelling.  Skin: Negative.   Neurological:  Negative for speech difficulty and weakness.  Psychiatric/Behavioral: Negative.       Objective:    Physical Exam Vitals and nursing note reviewed.  Constitutional:      General: She is not in acute distress.    Appearance: Normal appearance. She is obese. She is not ill-appearing, toxic-appearing or diaphoretic.  HENT:     Head: Normocephalic and atraumatic.     Right Ear: Tympanic membrane, ear canal and external ear normal.     Left Ear: Tympanic membrane, ear canal and external ear normal.     Mouth/Throat:     Mouth: Mucous membranes are moist.     Pharynx: Oropharynx is clear. No oropharyngeal exudate or posterior oropharyngeal erythema.  Neck:     Vascular: No carotid bruit.  Cardiovascular:      Rate and Rhythm: Normal rate and regular rhythm.  Pulmonary:     Effort: Pulmonary effort is normal.     Breath sounds: Normal breath sounds.  Abdominal:     General: Bowel sounds are normal.  Musculoskeletal:     Cervical back: No rigidity or tenderness.  Lymphadenopathy:     Cervical: No cervical adenopathy.  Skin:    General: Skin is warm and dry.  Neurological:     Mental Status: She is alert and oriented to person, place, and time.  Psychiatric:        Mood and Affect: Mood normal.        Behavior: Behavior normal.    BP 118/72   Pulse 66   Temp 97.6 F (36.4 C) (Temporal)   Ht 5\' 2"  (1.575 m)   Wt 125 lb 6.4 oz (56.9 kg)   SpO2 99%   BMI 22.94 kg/m  Wt Readings from Last 3 Encounters:  11/29/20 125 lb 6.4 oz (56.9 kg)  11/24/20 121 lb (54.9 kg)  10/27/20 121 lb (54.9 kg)     Health Maintenance Due  Topic Date Due   Hepatitis C Screening  Never done   TETANUS/TDAP  Never done   Zoster Vaccines- Shingrix (1 of 2) Never done    There are no preventive care reminders to display for this patient.  Lab Results  Component Value Date   TSH 0.43 08/27/2020   Lab Results  Component Value Date   WBC 11.7 (H) 08/27/2020   HGB 13.3 08/27/2020   HCT 40.1 08/27/2020   MCV 87.4 08/27/2020   PLT 356 08/27/2020   Lab Results  Component Value Date   NA 139 10/29/2019   K 4.2 10/29/2019   CO2 26 10/29/2019  GLUCOSE 89 10/29/2019   BUN 16 10/29/2019   CREATININE 0.71 10/29/2019   BILITOT 0.3 05/22/2018   ALKPHOS 90 05/22/2018   AST 12 05/22/2018   ALT 5 05/22/2018   PROT 7.4 05/22/2018   ALBUMIN 4.0 05/22/2018   CALCIUM 9.0 10/29/2019   ANIONGAP 6 03/23/2018   GFR 79.45 10/29/2019   Lab Results  Component Value Date   CHOL 180 10/29/2019   Lab Results  Component Value Date   HDL 63.50 10/29/2019   Lab Results  Component Value Date   LDLCALC 104 (H) 10/29/2019   Lab Results  Component Value Date   TRIG 63.0 10/29/2019   Lab Results   Component Value Date   CHOLHDL 3 10/29/2019   No results found for: HGBA1C    Assessment & Plan:   Problem List Items Addressed This Visit       Cardiovascular and Mediastinum   Essential hypertension   Relevant Orders   CBC   Comprehensive metabolic panel   Urinalysis, Routine w reflex microscopic     Endocrine   Hypothyroidism   Relevant Orders   TSH     Other   Vitamin D deficiency   Relevant Orders   VITAMIN D 25 Hydroxy (Vit-D Deficiency, Fractures)   Mixed hyperlipidemia   Relevant Orders   Comprehensive metabolic panel   Lipid panel   Tobacco use disorder, continuous   RLS (restless legs syndrome) - Primary   Relevant Medications   pramipexole (MIRAPEX) 0.5 MG tablet    Meds ordered this encounter  Medications   pramipexole (MIRAPEX) 0.5 MG tablet    Sig: Take one nightly one hour prior to going to bed as needed for restless leg syndrome.    Dispense:  90 tablet    Refill:  4    Follow-up: Return in about 3 months (around 03/01/2021), or Please stop smoking..   Prescription for Mirapex sent to mail order pharmacy.  Encouraged patient to stay as active as her back for a long while.  Asked her to please stop smoking.  Given information on steps to quit smoking. Libby Maw, MD  6/30 addendum: assures compliance with 5mcg dose of levothyroxine. Have increased dose to 67mcg. Has fu scheduled for 8/12.

## 2020-11-29 NOTE — Progress Notes (Signed)
There are 2 issues with blood work:  1. TSH has increased significantly from last visit. Has patient been taking her thyroid tablet every day?  2. Kidney function has declined. Please drink more water.

## 2020-12-03 ENCOUNTER — Telehealth: Payer: Self-pay | Admitting: Family Medicine

## 2020-12-03 NOTE — Telephone Encounter (Signed)
What is the name of the medication? Ana Santos (PLAVIX) 75 MG tablet [[id number]]   Have you contacted your pharmacy to request a refill? Pt is completely out.  SHE IS WANTING A BOTTLE SENT TO [facility] Address: [address] [place], [place] [address] JUST UNTIL SHE GETS HER REGULAR REFILL FROM [facility].  Which pharmacy would you like this sent to? Pharmacy  [facility] [place], [place]  [address], [place] [address]  Phone:  [phone]  Fax:  [phone]      Patient notified that their request is being sent to the clinical staff for review and that they should receive a call once it is complete. If they do not receive a call within 72 hours they can check with their pharmacy or our office.

## 2020-12-06 ENCOUNTER — Other Ambulatory Visit: Payer: Self-pay

## 2020-12-06 DIAGNOSIS — I6350 Cerebral infarction due to unspecified occlusion or stenosis of unspecified cerebral artery: Secondary | ICD-10-CM

## 2020-12-06 MED ORDER — CLOPIDOGREL BISULFATE 75 MG PO TABS: 75.0000 mg | ORAL_TABLET | Freq: Every day | ORAL | 3 refills | Status: DC

## 2020-12-06 NOTE — Telephone Encounter (Signed)
Rx sent in

## 2020-12-07 ENCOUNTER — Telehealth: Payer: Self-pay | Admitting: Family Medicine

## 2020-12-07 DIAGNOSIS — I6350 Cerebral infarction due to unspecified occlusion or stenosis of unspecified cerebral artery: Secondary | ICD-10-CM

## 2020-12-07 MED ORDER — CLOPIDOGREL BISULFATE 75 MG PO TABS: 75.0000 mg | ORAL_TABLET | Freq: Every day | ORAL | 0 refills | Status: DC

## 2020-12-07 MED ORDER — CLOPIDOGREL BISULFATE 75 MG PO TABS: 75.0000 mg | ORAL_TABLET | Freq: Every day | ORAL | 3 refills | Status: DC

## 2020-12-07 NOTE — Telephone Encounter (Signed)
Please advise message below okay to send in 5 pills to cover patient until mail order is received?

## 2020-12-07 NOTE — Telephone Encounter (Signed)
Pt is needing about 4 or 5 pills of her Plavix. Dr. Ethelene Hal did send it to her mail in pharmacy on 12/06/20. She says it will take several days before it will arrive at her house. She would like [place] to use [facility] in [place]. Please advise pt

## 2020-12-08 ENCOUNTER — Telehealth: Payer: Self-pay

## 2020-12-08 NOTE — Telephone Encounter (Signed)
Pt calling and she said that her Plavix medication did not come in the mail today with the rest of her medications.  Pt said that she has been with out her Plavix for several days now and would like for it to be sent to  [facility], in [place], [place] today. I informed pt that there was a refill placed at that Drug store for the medication, Plavix was sent yesterday.

## 2020-12-09 MED ORDER — LEVOTHYROXINE SODIUM 88 MCG PO TABS: 88.0000 ug | ORAL_TABLET | Freq: Every day | ORAL | 0 refills | Status: DC

## 2020-12-09 NOTE — Addendum Note (Signed)
Addended by: Jon Billings on: 12/09/2020 01:03 PM   Modules accepted: Orders

## 2020-12-10 ENCOUNTER — Telehealth: Payer: Self-pay | Admitting: Orthopaedic Surgery

## 2020-12-10 NOTE — Telephone Encounter (Signed)
Pt called stating she is lots of pain and that is it incredibly difficult to walk, move, and do daily activities. Pt did move her appt to a sooner date in July instead of her original follow up sched for August  but wanted to send a message to the nurse and see if she could be worked in any sooner as she has declined since her previous appt. The best call back number is [phone].

## 2020-12-14 NOTE — Telephone Encounter (Signed)
I called, left voicemail for patient requesting return call.

## 2020-12-14 NOTE — Telephone Encounter (Signed)
Patient appt made for 1030 tomorrow morning.

## 2020-12-14 NOTE — Telephone Encounter (Signed)
Pt returning vm to [place]. The best call back number is [phone].

## 2020-12-15 ENCOUNTER — Encounter: Payer: Self-pay | Admitting: Orthopaedic Surgery

## 2020-12-15 ENCOUNTER — Ambulatory Visit (INDEPENDENT_AMBULATORY_CARE_PROVIDER_SITE_OTHER): Payer: Medicare Other | Admitting: Orthopaedic Surgery

## 2020-12-15 ENCOUNTER — Other Ambulatory Visit: Payer: Self-pay | Admitting: Family Medicine

## 2020-12-15 VITALS — Ht 62.0 in | Wt 125.0 lb

## 2020-12-15 DIAGNOSIS — M545 Low back pain, unspecified: Secondary | ICD-10-CM | POA: Diagnosis not present

## 2020-12-15 DIAGNOSIS — G8929 Other chronic pain: Secondary | ICD-10-CM | POA: Diagnosis not present

## 2020-12-15 DIAGNOSIS — I6350 Cerebral infarction due to unspecified occlusion or stenosis of unspecified cerebral artery: Secondary | ICD-10-CM | POA: Diagnosis not present

## 2020-12-15 DIAGNOSIS — E782 Mixed hyperlipidemia: Secondary | ICD-10-CM

## 2020-12-15 MED ORDER — GABAPENTIN 100 MG PO CAPS
ORAL_CAPSULE | ORAL | 2 refills | Status: DC
Start: 2020-12-15 — End: 2021-06-01

## 2020-12-16 ENCOUNTER — Telehealth: Payer: Self-pay | Admitting: Physical Medicine and Rehabilitation

## 2020-12-16 NOTE — Progress Notes (Signed)
Office Visit Note   Patient: Ana Santos           Date of Birth: 1941/02/16           MRN: [id number] Visit Date: 12/15/2020              Requested by: Libby Maw, MD [address] [place],  [place] [address] PCP: Libby Maw, MD   Assessment & Plan: Visit Diagnoses:  1. Chronic left-sided low back pain without sciatica     Plan: Patient has small left paracentral disc protrusion at L1-2 which may be the symptomatic level.  She had some foraminal stenosis at L3-4 but did not respond to previous epidural injection at that level.  Some narrowing L5-S1 on the right side but this is opposite from her left anterior thigh symptoms.  Patient states the pain is so bad that she is sick and has trouble eating.  We will set her up for epidural injection left L1-2 level with Dr. In and see if this gives her some relief.  Follow-Up Instructions: No follow-ups on file.   Orders:  Orders Placed This Encounter  Procedures   Ambulatory referral to Physical Medicine Rehab   Meds ordered this encounter  Medications   gabapentin (NEURONTIN) 100 MG capsule    Sig: Take one at night times 1 week then 2 po q HS    Dispense:  60 capsule    Refill:  2      Procedures: No procedures performed   Clinical Data: No additional findings.   Subjective: Chief Complaint  Patient presents with   Lower Back - Pain    HPI 80 year old female returns she states she is continuing to have ongoing pain.  Past history of Ultram medication long-term use with withdrawal.  Husband is with her today.  She states the pain gets worse she is having trouble going to the bathroom has increased pain when she coughs.  She states she has constant pain Tylenol is not giving her relief.  She has trouble sitting has trouble walking.  She states she has left leg pain with burning.  Epidural at L3-4 did not give her relief.  Pain radiates from her groin into the anterior thigh on the left  and stops above the knee.  Patient tried going to the pool but was not allowed in the due to abrasions on her left arm.  She is doing some home exercises and states her continue to give her persistent pain.  Previous history of CVA, COPD still smoking.  Paracentral disc protrusion at L1-2 slightly more to the left than the right.  No history of other rheumatologic conditions.  Patient's used blue emu spray, Tylenol, Lidoderm patches without relief.  Review of Systems 14 point system update unchanged from 11/24/2020.   Objective: Vital Signs: Ht 5\' 2"  (1.575 m)   Wt 125 lb (56.7 kg)   BMI 22.86 kg/m   Physical Exam Constitutional:      Appearance: She is well-developed.  HENT:     Head: Normocephalic.     Right Ear: External ear normal.     Left Ear: External ear normal. There is no impacted cerumen.  Eyes:     Pupils: Pupils are equal, round, and reactive to light.  Neck:     Thyroid: No thyromegaly.     Trachea: No tracheal deviation.  Cardiovascular:     Rate and Rhythm: Normal rate.  Pulmonary:     Effort: Pulmonary  effort is normal.  Abdominal:     Palpations: Abdomen is soft.  Musculoskeletal:     Cervical back: No rigidity.  Skin:    General: Skin is warm and dry.  Neurological:     Mental Status: She is alert and oriented to person, place, and time.  Psychiatric:        Behavior: Behavior normal.    Ortho Exam negative logroll test right left hip.  Some decreased sensation left proximal anterior thigh.  Negative FABER test right and left.  Knee and ankle jerk are intact.  Anterior tib EHL is active.  Specialty Comments:  No specialty comments available.  Imaging: CLINICAL DATA:  Left-sided low back pain. History of fall in September of 2021   EXAM: MRI LUMBAR SPINE WITHOUT CONTRAST   TECHNIQUE: Multiplanar, multisequence MR imaging of the lumbar spine was performed. No intravenous contrast was administered.   COMPARISON:  MRI 03/02/2020    FINDINGS: Segmentation:  Standard.   Alignment: Unchanged trace anterolisthesis L5 on S1. Slight dextrocurvature.   Vertebrae:  No fracture, evidence of discitis, or bone lesion.   Conus medullaris and cauda equina: Conus extends to the L1-2 level. Conus and cauda equina appear normal.   Paraspinal and other soft tissues: Probable bilateral renal cysts, unchanged.   Disc levels:   T12-L1: No significant disc protrusion, foraminal stenosis, or canal stenosis. Unchanged.   L1-L2: Disc height loss with mild circumferential disc bulge. New small left paracentral disc protrusion with slight inferior migration of disc material. No significant foraminal or canal stenosis.   L2-L3: Mild circumferential disc bulge and left greater than right facet arthropathy resulting in mild left subarticular recess narrowing. No significant foraminal or canal stenosis. Unchanged.   L3-L4: Circumferential disc bulge with bilateral facet arthropathy and ligamentum flavum buckling. Findings result in mild left foraminal stenosis and mild left greater than right subarticular recess stenosis. No significant canal stenosis. Unchanged.   L4-L5: Circumferential disc bulge with bilateral facet arthropathy resulting in moderate right and mild left foraminal stenosis with mild bilateral subarticular recess stenosis. No significant canal stenosis. Findings have slightly progressed from prior.   L5-S1: Mild disc uncovering with mild diffuse disc bulge. Advanced right and mild left facet arthropathy. Similar degree of mild right foraminal stenosis. No canal stenosis. Unchanged.   IMPRESSION: 1. Multilevel degenerative changes of the lumbar spine, as described above, slightly progressed at L4-L5 with moderate right and mild left foraminal stenosis and mild bilateral subarticular recess stenosis. 2. New small left paracentral disc protrusion at L1-L2. 3. Similar degree of mild right foraminal stenosis at  L5-S1 and mild left foraminal stenosis at L3-L4. 4. No significant canal stenosis at any level.     Electronically Signed   By: Davina Poke D.O.   On: 10/25/2020 09:22     PMFS History: Patient Active Problem List   Diagnosis Date Noted   Iron deficiency 02/06/2020   Labyrinthitis 12/08/2019   Left-sided low back pain without sciatica 05/05/2019   Visual loss, transient, bilateral 02/21/2019   Essential hypertension 02/21/2019   Anemia, iron deficiency 06/11/2018   Colonic mass s/p lap ileocectomy 03/14/2018 03/14/2018   Acute lower GI bleeding 03/14/2018   Chronic anticoagulation 03/14/2018   Chronic tension-type headache, not intractable 05/31/2015   Symptomatic PVCs 08/04/2014   Chest pain 08/03/2014   TIA (transient ischemic attack) 08/26/2013   Palpitations 07/09/2013   RLS (restless legs syndrome) 07/04/2012   Cerebral artery occlusion with cerebral infarction ([place]) 03/30/2010   Cerebrovascular  disease 07/13/2009   History of cerebral aneurysm 03/26/2009   Vitamin D deficiency 08/10/2008   Venous insufficiency 08/10/2008   Disorder of bone and cartilage 12/18/2007   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[place]) 08/18/2007   Hypothyroidism 08/18/2007   Tobacco use disorder, continuous 08/18/2007   Mixed hyperlipidemia 07/18/2007   Anxiety state 07/18/2007   Mitral valve disorder 07/18/2007   COPD (chronic obstructive pulmonary disease) with chronic bronchitis ([place]) 07/18/2007   Irritable bowel syndrome 07/18/2007   Fibromyalgia 07/18/2007   Past Medical History:  Diagnosis Date   AN (acoustic neuroma) (HCC)    Aneurysm (HCC)    Anxiety    Cataract    Cerebrovascular disease    Cigarette smoker    COPD (chronic obstructive pulmonary disease) (HCC)    mild, no inhalers or oxygen used   Coronary artery disease    no current cardiologist  pt. denies   Deafness in right ear    Depression    Headache    tension headaches due to aneurysm repair with stent    Heart murmur    History of kidney stones    15 to 20 yrs ago   Hyperlipidemia    Hypothyroidism    IBS (irritable bowel syndrome)    Mitral valve prolapse    mild takes beta blocker   Osteopenia    Palpitations    Stroke ([place])    x 3 ministrokes last one feb 2014   Venous insufficiency    Vitamin D deficiency     Family History  Problem Relation Age of Onset   Heart attack Mother    Diabetes Mother    Cancer Father    Diabetes Sister    Diabetes Brother    Cancer Sister        unkown type   Lung cancer Brother        Lung   AAA (abdominal aortic aneurysm) Neg Hx    Colon cancer Neg Hx    Stomach cancer Neg Hx    Esophageal cancer Neg Hx    Liver cancer Neg Hx    Pancreatic cancer Neg Hx    Rectal cancer Neg Hx     Past Surgical History:  Procedure Laterality Date   ANEURYSM COILING  11-10   Dr. Estanislado Pandy   BREAST LUMPECTOMY Right 1986   benign   DIAGNOSTIC LAPAROSCOPY     Proable laparoscopic right colectomy 03-14-18 Dr. Excell Seltzer   EYE SURGERY Bilateral    ioc lens for cataracts   LAPAROSCOPIC RIGHT COLECTOMY Right 03/14/2018   Procedure: LAPAROSCOPIC ASSISTED  RIGHT COLECTOMY;  Surgeon: Excell Seltzer, MD;  Location: [facility];  Service: General;  Laterality: Right;   LAPAROSCOPY N/A 03/14/2018   Procedure: LAPAROSCOPY;  Surgeon: Excell Seltzer, MD;  Location: [facility];  Service: General;  Laterality: N/A;   LEFT HEART CATHETERIZATION WITH CORONARY ANGIOGRAM N/A 08/05/2014   Procedure: LEFT HEART CATHETERIZATION WITH CORONARY ANGIOGRAM;  Surgeon: Wellington Hampshire, MD;  Location: [facility];  Service: Cardiovascular;  Laterality: N/A;   TONSILLECTOMY     TOTAL ABDOMINAL HYSTERECTOMY  1970   complete   TRANSLABYRINTHINE PROCEDURE  2002   [facility] Dr. Vicie Mutters tumor removal   TRANSURETHRAL RESECTION OF BLADDER TUMOR N/A 02/08/2018   Procedure: TRANSURETHRAL RESECTION OF BLADDER TUMOR (TURBT)/ POSTOPERATIVE INSTILLATION OF CHEMO THERAPY;  Surgeon: Festus Aloe, MD;   Location: [facility];  Service: Urology;  Laterality: N/A;   Social History   Occupational History   Occupation:  alterations  Tobacco Use   Smoking status: Every Day    Packs/day: 0.25    Years: 50.00    Pack years: 12.50    Types: Cigarettes   Smokeless tobacco: Never   Tobacco comments:    4-5 cigs daily  is aware she needs to quit  Vaping Use   Vaping Use: Never used  Substance and Sexual Activity   Alcohol use: No    Alcohol/week: 0.0 standard drinks   Drug use: No   Sexual activity: Not Currently

## 2020-12-16 NOTE — Telephone Encounter (Signed)
Pt called stating she missed a call from Dr. Ernestina Patches to set an appt and would like to be tried again.   [phone]

## 2020-12-17 NOTE — Telephone Encounter (Signed)
Patient called she is returning your phone call :[phone]

## 2020-12-19 DIAGNOSIS — M791 Myalgia, unspecified site: Secondary | ICD-10-CM | POA: Diagnosis not present

## 2020-12-19 DIAGNOSIS — R059 Cough, unspecified: Secondary | ICD-10-CM | POA: Diagnosis not present

## 2020-12-19 DIAGNOSIS — Z20828 Contact with and (suspected) exposure to other viral communicable diseases: Secondary | ICD-10-CM | POA: Diagnosis not present

## 2020-12-19 DIAGNOSIS — J189 Pneumonia, unspecified organism: Secondary | ICD-10-CM | POA: Diagnosis not present

## 2020-12-20 DIAGNOSIS — Z20828 Contact with and (suspected) exposure to other viral communicable diseases: Secondary | ICD-10-CM | POA: Diagnosis not present

## 2020-12-20 NOTE — Telephone Encounter (Signed)
Scheduled for OV with Megan on 7/12 at 1330.

## 2020-12-20 NOTE — Telephone Encounter (Signed)
Referral from Dr. Lorin Mercy. OV with Dr. Ernestina Patches or Jinny Blossom.

## 2020-12-21 ENCOUNTER — Other Ambulatory Visit: Payer: Self-pay

## 2020-12-21 ENCOUNTER — Encounter: Payer: Self-pay | Admitting: Physical Medicine and Rehabilitation

## 2020-12-21 ENCOUNTER — Ambulatory Visit (INDEPENDENT_AMBULATORY_CARE_PROVIDER_SITE_OTHER): Payer: Medicare Other | Admitting: Physical Medicine and Rehabilitation

## 2020-12-21 VITALS — BP 97/60 | HR 96

## 2020-12-21 DIAGNOSIS — M5116 Intervertebral disc disorders with radiculopathy, lumbar region: Secondary | ICD-10-CM

## 2020-12-21 DIAGNOSIS — R1032 Left lower quadrant pain: Secondary | ICD-10-CM | POA: Diagnosis not present

## 2020-12-21 DIAGNOSIS — M5416 Radiculopathy, lumbar region: Secondary | ICD-10-CM | POA: Diagnosis not present

## 2020-12-21 DIAGNOSIS — M25552 Pain in left hip: Secondary | ICD-10-CM | POA: Diagnosis not present

## 2020-12-21 MED ORDER — PREDNISONE 50 MG PO TABS: 50.0000 mg | ORAL_TABLET | Freq: Every day | ORAL | 0 refills | Status: DC

## 2020-12-21 NOTE — Progress Notes (Signed)
Pt state lower back pain that travels her left leg with tingling feelings. Pt state walking, sitting and standing makes the pain worse. Pt state she takes pain meds and heating pads to help ease her pain.  Numeric Pain Rating Scale and Functional Assessment Average Pain 9 Pain Right Now 10 My pain is constant, sharp, stabbing, tingling, and aching Pain is worse with: walking, sitting, standing, and some activites Pain improves with: heat/ice and medication   In the last MONTH (on 0-10 scale) has pain interfered with the following?  1. General activity like being  able to carry out your everyday physical activities such as walking, climbing stairs, carrying groceries, or moving a chair?  Rating(7)  2. Relation with others like being able to carry out your usual social activities and roles such as  activities at home, at work and in your community. Rating(8)  3. Enjoyment of life such that you have  been bothered by emotional problems such as feeling anxious, depressed or irritable?  Rating(9)

## 2020-12-21 NOTE — Progress Notes (Signed)
Ana Santos - 80 y.o. female MRN [id number]  Date of birth: Jan 23, 1941  Office Visit Note: Visit Date: 12/21/2020 PCP: Libby Maw, MD Referred by: Libby Maw,*  Subjective: No chief complaint on file.  HPI: Ana Santos is a 80 y.o. female who comes in today per the request of Dr. Rodell Perna for evaluation of chronic, worsening, and severe left lower back pain radiating to left hip, groin and thigh.  Patient reports pain has been ongoing since she sustained fall in September of 2021.  Patient reports severe pain today that she describes as tightness, rating 10 out of 10.  Patient reports pain worsens with walking and activity.  Patient reports some relief of pain with stretching, rest and use of heating pad at home.  Patient continues to take Tylenol with some relief of pain at home, and was also recently started on Gabapentin by Dr. Rodell Perna.  Patient had left L3-L4 transforaminal epidural steroid injection in June 2022, which gave her 10% relief of pain for 3 days.  Patient's recent lumbar MRI exhibits small left paracentral disc protrusion at L1-L2. Patient reports history of physical therapy in [place] for this same back pain in 2021 that seemed to make the pain worse.  Patient is tearful in office today and is requesting medication to help relieve pain until she is able to have injection.  Patient also reports she feels that this pain is causing her to have trouble sleeping, walking and performing activities of daily living.  Patient denies focal weakness, numbness, and tingling.  Patient denies recent trauma or falls.  Patient denies bowel and bladder incontinence.  Patient's course is complicated by tobacco abuse, COPD and CVA.   Review of Systems  Musculoskeletal:  Positive for back pain and joint pain.  Neurological:  Negative for tingling and focal weakness.  All other systems reviewed and are negative. Otherwise per HPI.  Assessment & Plan: Visit  Diagnoses:    ICD-10-CM   1. Lumbar radiculopathy  M54.16     2. Intervertebral disc disorders with radiculopathy, lumbar region  M51.16     3. Pain in left hip  M25.552     4. Groin pain, left  R10.32        Plan: Findings:  Chronic, worsening, and severe lumbar radiculopathy, left greater than right.  Patient's imaging and clinical exam are consistent with L2 versus L3 nerve pattern.  Patient continues to have excruciating pain that is limiting her daily activities despite intervention at the L3 level.  Fluoroscopic imaging of the prior injection was a well-placed injection.  Pt verbalizes inability to take narcotic pain medications due to hallucinations and issues with withdrawal in the past.  Plan of care discussed with patient including starting her on Prednisone to take at home for the next 2 days and getting her in for a diagnostic and hopefully therapeutic left L1-L2 transforaminal ESI. Patient encouraged to continue good conservative care such as stretching exercises, medications and use of heating pad at home.    Meds & Orders:  Meds ordered this encounter  Medications   predniSONE (DELTASONE) 50 MG tablet    Sig: Take 1 tablet (50 mg total) by mouth daily with breakfast. Take until completed.    Dispense:  2 tablet    Refill:  0    Order Specific Question:   Supervising Provider    Answer:   Magnus Sinning [[id number]]   No orders of the defined types were placed in  this encounter.   Follow-up: Return in about 2 days (around 12/23/2020) for L1-L2 transforaminal ESI.   Procedures: No procedures performed      Clinical History: MRI LUMBAR SPINE WITHOUT CONTRAST   TECHNIQUE: Multiplanar, multisequence MR imaging of the lumbar spine was performed. No intravenous contrast was administered.   COMPARISON:  MRI 03/02/2020   FINDINGS: Segmentation:  Standard.   Alignment: Unchanged trace anterolisthesis L5 on S1. Slight dextrocurvature.   Vertebrae:  No fracture, evidence  of discitis, or bone lesion.   Conus medullaris and cauda equina: Conus extends to the L1-2 level. Conus and cauda equina appear normal.   Paraspinal and other soft tissues: Probable bilateral renal cysts, unchanged.   Disc levels:   T12-L1: No significant disc protrusion, foraminal stenosis, or canal stenosis. Unchanged.   L1-L2: Disc height loss with mild circumferential disc bulge. New small left paracentral disc protrusion with slight inferior migration of disc material. No significant foraminal or canal stenosis.   L2-L3: Mild circumferential disc bulge and left greater than right facet arthropathy resulting in mild left subarticular recess narrowing. No significant foraminal or canal stenosis. Unchanged.   L3-L4: Circumferential disc bulge with bilateral facet arthropathy and ligamentum flavum buckling. Findings result in mild left foraminal stenosis and mild left greater than right subarticular recess stenosis. No significant canal stenosis. Unchanged.   L4-L5: Circumferential disc bulge with bilateral facet arthropathy resulting in moderate right and mild left foraminal stenosis with mild bilateral subarticular recess stenosis. No significant canal stenosis. Findings have slightly progressed from prior.   L5-S1: Mild disc uncovering with mild diffuse disc bulge. Advanced right and mild left facet arthropathy. Similar degree of mild right foraminal stenosis. No canal stenosis. Unchanged.   IMPRESSION: 1. Multilevel degenerative changes of the lumbar spine, as described above, slightly progressed at L4-L5 with moderate right and mild left foraminal stenosis and mild bilateral subarticular recess stenosis. 2. New small left paracentral disc protrusion at L1-L2. 3. Similar degree of mild right foraminal stenosis at L5-S1 and mild left foraminal stenosis at L3-L4. 4. No significant canal stenosis at any level.     Electronically Signed   By: Davina Poke D.O.    On: 10/25/2020 09:22   She reports that she has been smoking cigarettes. She has a 12.50 pack-year smoking history. She has never used smokeless tobacco. No results for input(s): HGBA1C, LABURIC in the last 8760 hours.  Objective:  VS:  HT:    WT:   BMI:     BP:97/60  HR:96bpm  TEMP: ( )  RESP:  Physical Exam HENT:     Head: Normocephalic and atraumatic.     Right Ear: Tympanic membrane normal.     Left Ear: Tympanic membrane normal.     Nose: Nose normal.     Mouth/Throat:     Mouth: Mucous membranes are moist.  Eyes:     Pupils: Pupils are equal, round, and reactive to light.  Cardiovascular:     Rate and Rhythm: Normal rate.     Pulses: Normal pulses.  Pulmonary:     Effort: Pulmonary effort is normal.  Abdominal:     General: There is no distension.  Musculoskeletal:     Cervical back: Normal range of motion and neck supple.     Comments: Pt is slow to rise from seated position to standing. Strong distal strength without clonus, no pain upon palpation of greater trochanters. Sensation intact bilaterally. Walks independently, gait slow.   Skin:    General:  Skin is warm and dry.     Capillary Refill: Capillary refill takes less than 2 seconds.  Neurological:     General: No focal deficit present.     Mental Status: She is alert.  Psychiatric:        Mood and Affect: Mood normal.    Ortho Exam  Imaging: No results found.  Past Medical/Family/Surgical/Social History: Medications & Allergies reviewed per EMR, new medications updated. Patient Active Problem List   Diagnosis Date Noted   Iron deficiency 02/06/2020   Labyrinthitis 12/08/2019   Left-sided low back pain without sciatica 05/05/2019   Visual loss, transient, bilateral 02/21/2019   Essential hypertension 02/21/2019   Anemia, iron deficiency 06/11/2018   Colonic mass s/p lap ileocectomy 03/14/2018 03/14/2018   Acute lower GI bleeding 03/14/2018   Chronic anticoagulation 03/14/2018   Chronic  tension-type headache, not intractable 05/31/2015   Symptomatic PVCs 08/04/2014   Chest pain 08/03/2014   TIA (transient ischemic attack) 08/26/2013   Palpitations 07/09/2013   RLS (restless legs syndrome) 07/04/2012   Cerebral artery occlusion with cerebral infarction ([place]) 03/30/2010   Cerebrovascular disease 07/13/2009   History of cerebral aneurysm 03/26/2009   Vitamin D deficiency 08/10/2008   Venous insufficiency 08/10/2008   Disorder of bone and cartilage 12/18/2007   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[place]) 08/18/2007   Hypothyroidism 08/18/2007   Tobacco use disorder, continuous 08/18/2007   Mixed hyperlipidemia 07/18/2007   Anxiety state 07/18/2007   Mitral valve disorder 07/18/2007   COPD (chronic obstructive pulmonary disease) with chronic bronchitis ([place]) 07/18/2007   Irritable bowel syndrome 07/18/2007   Fibromyalgia 07/18/2007   Past Medical History:  Diagnosis Date   AN (acoustic neuroma) ([place])    Aneurysm ([place])    Anxiety    Cataract    Cerebrovascular disease    Cigarette smoker    COPD (chronic obstructive pulmonary disease) ([facility])    mild, no inhalers or oxygen used   Coronary artery disease    no current cardiologist  pt. denies   Deafness in right ear    Depression    Headache    tension headaches due to aneurysm repair with stent   Heart murmur    History of kidney stones    15 to 20 yrs ago   Hyperlipidemia    Hypothyroidism    IBS (irritable bowel syndrome)    Mitral valve prolapse    mild takes beta blocker   Osteopenia    Palpitations    Stroke ([place])    x 3 ministrokes last one feb 2014   Venous insufficiency    Vitamin D deficiency    Family History  Problem Relation Age of Onset   Heart attack Mother    Diabetes Mother    Cancer Father    Diabetes Sister    Diabetes Brother    Cancer Sister        unkown type   Lung cancer Brother        Lung   AAA (abdominal aortic aneurysm) Neg Hx    Colon cancer Neg Hx    Stomach cancer  Neg Hx    Esophageal cancer Neg Hx    Liver cancer Neg Hx    Pancreatic cancer Neg Hx    Rectal cancer Neg Hx    Past Surgical History:  Procedure Laterality Date   ANEURYSM COILING  11-10   Dr. Estanislado Pandy   BREAST LUMPECTOMY Right 1986   benign   DIAGNOSTIC LAPAROSCOPY  Proable laparoscopic right colectomy 03-14-18 Dr. Excell Seltzer   EYE SURGERY Bilateral    ioc lens for cataracts   LAPAROSCOPIC RIGHT COLECTOMY Right 03/14/2018   Procedure: LAPAROSCOPIC ASSISTED  RIGHT COLECTOMY;  Surgeon: Excell Seltzer, MD;  Location: [facility];  Service: General;  Laterality: Right;   LAPAROSCOPY N/A 03/14/2018   Procedure: LAPAROSCOPY;  Surgeon: Excell Seltzer, MD;  Location: [facility];  Service: General;  Laterality: N/A;   LEFT HEART CATHETERIZATION WITH CORONARY ANGIOGRAM N/A 08/05/2014   Procedure: LEFT HEART CATHETERIZATION WITH CORONARY ANGIOGRAM;  Surgeon: Wellington Hampshire, MD;  Location: [facility];  Service: Cardiovascular;  Laterality: N/A;   TONSILLECTOMY     TOTAL ABDOMINAL HYSTERECTOMY  1970   complete   TRANSLABYRINTHINE PROCEDURE  2002   [facility] Dr. Vicie Mutters tumor removal   TRANSURETHRAL RESECTION OF BLADDER TUMOR N/A 02/08/2018   Procedure: TRANSURETHRAL RESECTION OF BLADDER TUMOR (TURBT)/ POSTOPERATIVE INSTILLATION OF CHEMO THERAPY;  Surgeon: Festus Aloe, MD;  Location: [facility];  Service: Urology;  Laterality: N/A;   Social History   Occupational History   Occupation: alterations  Tobacco Use   Smoking status: Every Day    Packs/day: 0.25    Years: 50.00    Pack years: 12.50    Types: Cigarettes   Smokeless tobacco: Never   Tobacco comments:    4-5 cigs daily  is aware she needs to quit  Vaping Use   Vaping Use: Never used  Substance and Sexual Activity   Alcohol use: No    Alcohol/week: 0.0 standard drinks   Drug use: No   Sexual activity: Not Currently

## 2020-12-23 ENCOUNTER — Ambulatory Visit (INDEPENDENT_AMBULATORY_CARE_PROVIDER_SITE_OTHER): Payer: Medicare Other | Admitting: Physical Medicine and Rehabilitation

## 2020-12-23 ENCOUNTER — Encounter: Payer: Self-pay | Admitting: Physical Medicine and Rehabilitation

## 2020-12-23 ENCOUNTER — Other Ambulatory Visit: Payer: Self-pay

## 2020-12-23 ENCOUNTER — Ambulatory Visit: Payer: Self-pay

## 2020-12-23 VITALS — BP 137/73 | HR 105

## 2020-12-23 DIAGNOSIS — Z789 Other specified health status: Secondary | ICD-10-CM | POA: Diagnosis not present

## 2020-12-23 DIAGNOSIS — M5416 Radiculopathy, lumbar region: Secondary | ICD-10-CM | POA: Diagnosis not present

## 2020-12-23 DIAGNOSIS — M5116 Intervertebral disc disorders with radiculopathy, lumbar region: Secondary | ICD-10-CM

## 2020-12-23 DIAGNOSIS — I6350 Cerebral infarction due to unspecified occlusion or stenosis of unspecified cerebral artery: Secondary | ICD-10-CM

## 2020-12-23 MED ORDER — ONDANSETRON HCL 4 MG PO TABS: 4.0000 mg | ORAL_TABLET | Freq: Three times a day (TID) | ORAL | 0 refills | Status: DC | PRN

## 2020-12-23 MED ORDER — DEXAMETHASONE SODIUM PHOSPHATE 10 MG/ML IJ SOLN: 15.0000 mg | Freq: Once | INTRAMUSCULAR | Status: DC

## 2020-12-23 NOTE — Progress Notes (Signed)
Pt state lower back pain that travels down her left leg. Pt state walking, standing and sitting makes the pain worse. Pt state she take pain meds ot help ease her pain. Pt has hx of inj on 11/10/20 pt state it helped very little.  Numeric Pain Rating Scale and Functional Assessment Average Pain 10   In the last MONTH (on 0-10 scale) has pain interfered with the following?  1. General activity like being  able to carry out your everyday physical activities such as walking, climbing stairs, carrying groceries, or moving a chair?  Rating(10)   +Driver, -BT, -Dye Allergies.

## 2020-12-23 NOTE — Progress Notes (Signed)
[name] - 80 y.o. female MRN [id number]  Date of birth: 1941/05/14  Office Visit Note: Visit Date: 12/23/2020 PCP: Libby Maw, MD Referred by: Libby Maw,*  Subjective: Chief Complaint  Patient presents with   Lower Back - Pain   Left Leg - Pain   HPI: Ana Santos is a 80 y.o. female who comes in today for planned left L1-L2 transforaminal epidural steroid injection. Patient reports nausea/vomiting and fatigue over the past 2 days since starting Prednisone. Patient states symptoms started shortly after taking Prednisone on Wednesday morning. Despite gastrointestinal symptoms, patient verbalizes that her left lower back pain is much better today, rating 0 out of 10. Patient reports she feels like she can move better today without having excruciating pain. Patient is also tearful and reports she is very stressed and anxious. She states this has been ongoing for quite some time, but seems to be getting worse. Patient reports her husband continues to help her at home, but she feels guilty about his assistance because of his own health issues. Patient also reports she is under a lot of stress due to personal issues with her son. Patient reports she is drinking fluids at home, but has no appetite. Patient requesting nausea medication at this time. Patient denies focal weakness, numbness or tingling. She denies recent trauma or falls.   Review of Systems  Constitutional:  Positive for malaise/fatigue.  Gastrointestinal:  Positive for nausea and vomiting.  Musculoskeletal:  Negative for back pain.  Neurological:  Negative for tingling and focal weakness.  Otherwise per HPI.  Assessment & Plan: Visit Diagnoses:    ICD-10-CM   1. Lumbar radiculopathy  M54.16 XR C-ARM NO REPORT    Epidural Steroid injection    dexamethasone (DECADRON) injection 15 mg    2. Intervertebral disc disorders with radiculopathy, lumbar region  M51.16     3. Drug intolerance   Z78.9        Plan: Findings:  Patient reports complete resolution of left lumbar radicular pain today. We will hold on the injection for today due to pain relief and continued gastrointestinal symptoms from Prednisone intolerance. We will refer patient back to Dr. Rodell Perna for follow-up as needed and would be happy to perform injection at his discretion. I spoke with patient in detail about plan of care including following up with PCP regarding persistent issues with stress and anxiety. Zofran called in for patient today to help with nausea/vomiting. No red flag symptoms noted upon exam today.   Meds & Orders:  Meds ordered this encounter  Medications   dexamethasone (DECADRON) injection 15 mg   ondansetron (ZOFRAN) 4 MG tablet    Sig: Take 1 tablet (4 mg total) by mouth every 8 (eight) hours as needed for nausea or vomiting.    Dispense:  20 tablet    Refill:  0    Order Specific Question:   Supervising Provider    Answer:   Magnus Sinning [[id number]]    Orders Placed This Encounter  Procedures   XR C-ARM NO REPORT   Epidural Steroid injection    Follow-up: Return if symptoms worsen or fail to improve.   Procedures: No procedures performed      Clinical History: MRI LUMBAR SPINE WITHOUT CONTRAST   TECHNIQUE: Multiplanar, multisequence MR imaging of the lumbar spine was performed. No intravenous contrast was administered.   COMPARISON:  MRI 03/02/2020   FINDINGS: Segmentation:  Standard.   Alignment: Unchanged trace anterolisthesis L5  on S1. Slight dextrocurvature.   Vertebrae:  No fracture, evidence of discitis, or bone lesion.   Conus medullaris and cauda equina: Conus extends to the L1-2 level. Conus and cauda equina appear normal.   Paraspinal and other soft tissues: Probable bilateral renal cysts, unchanged.   Disc levels:   T12-L1: No significant disc protrusion, foraminal stenosis, or canal stenosis. Unchanged.   L1-L2: Disc height loss with mild  circumferential disc bulge. New small left paracentral disc protrusion with slight inferior migration of disc material. No significant foraminal or canal stenosis.   L2-L3: Mild circumferential disc bulge and left greater than right facet arthropathy resulting in mild left subarticular recess narrowing. No significant foraminal or canal stenosis. Unchanged.   L3-L4: Circumferential disc bulge with bilateral facet arthropathy and ligamentum flavum buckling. Findings result in mild left foraminal stenosis and mild left greater than right subarticular recess stenosis. No significant canal stenosis. Unchanged.   L4-L5: Circumferential disc bulge with bilateral facet arthropathy resulting in moderate right and mild left foraminal stenosis with mild bilateral subarticular recess stenosis. No significant canal stenosis. Findings have slightly progressed from prior.   L5-S1: Mild disc uncovering with mild diffuse disc bulge. Advanced right and mild left facet arthropathy. Similar degree of mild right foraminal stenosis. No canal stenosis. Unchanged.   IMPRESSION: 1. Multilevel degenerative changes of the lumbar spine, as described above, slightly progressed at L4-L5 with moderate right and mild left foraminal stenosis and mild bilateral subarticular recess stenosis. 2. New small left paracentral disc protrusion at L1-L2. 3. Similar degree of mild right foraminal stenosis at L5-S1 and mild left foraminal stenosis at L3-L4. 4. No significant canal stenosis at any level.     Electronically Signed   By: Davina Poke D.O.   On: 10/25/2020 09:22   She reports that she has been smoking cigarettes. She has a 12.50 pack-year smoking history. She has never used smokeless tobacco. No results for input(s): HGBA1C, LABURIC in the last 8760 hours.  Objective:  VS:  HT:    WT:   BMI:     BP:137/73  HR:(!) 105bpm  TEMP: ( )  RESP:  Physical Exam Constitutional:      Appearance: She is  ill-appearing.  HENT:     Head: Normocephalic and atraumatic.     Right Ear: Tympanic membrane normal.     Left Ear: Tympanic membrane normal.     Nose: Nose normal.     Mouth/Throat:     Mouth: Mucous membranes are moist.  Eyes:     Pupils: Pupils are equal, round, and reactive to light.  Cardiovascular:     Rate and Rhythm: Normal rate.     Pulses: Normal pulses.  Pulmonary:     Effort: Pulmonary effort is normal.  Abdominal:     General: There is no distension.  Musculoskeletal:     Cervical back: Normal range of motion and neck supple.     Comments: Pt is slow to rise from seated to standing position, gait is slow and requires assistance.   Skin:    General: Skin is warm and dry.     Capillary Refill: Capillary refill takes less than 2 seconds.  Neurological:     General: No focal deficit present.     Mental Status: She is alert.  Psychiatric:        Mood and Affect: Mood is anxious and depressed. Affect is tearful.    Ortho Exam  Imaging: No results found.  Past Medical/Family/Surgical/Social History:  Medications & Allergies reviewed per EMR, new medications updated. Patient Active Problem List   Diagnosis Date Noted   Iron deficiency 02/06/2020   Labyrinthitis 12/08/2019   Left-sided low back pain without sciatica 05/05/2019   Visual loss, transient, bilateral 02/21/2019   Essential hypertension 02/21/2019   Anemia, iron deficiency 06/11/2018   Colonic mass s/p lap ileocectomy 03/14/2018 03/14/2018   Acute lower GI bleeding 03/14/2018   Chronic anticoagulation 03/14/2018   Chronic tension-type headache, not intractable 05/31/2015   Symptomatic PVCs 08/04/2014   Chest pain 08/03/2014   TIA (transient ischemic attack) 08/26/2013   Palpitations 07/09/2013   RLS (restless legs syndrome) 07/04/2012   Cerebral artery occlusion with cerebral infarction ([place]) 03/30/2010   Cerebrovascular disease 07/13/2009   History of cerebral aneurysm 03/26/2009   Vitamin D  deficiency 08/10/2008   Venous insufficiency 08/10/2008   Disorder of bone and cartilage 12/18/2007   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[place]) 08/18/2007   Hypothyroidism 08/18/2007   Tobacco use disorder, continuous 08/18/2007   Mixed hyperlipidemia 07/18/2007   Anxiety state 07/18/2007   Mitral valve disorder 07/18/2007   COPD (chronic obstructive pulmonary disease) with chronic bronchitis ([place]) 07/18/2007   Irritable bowel syndrome 07/18/2007   Fibromyalgia 07/18/2007   Past Medical History:  Diagnosis Date   AN (acoustic neuroma) ([place])    Aneurysm ([place])    Anxiety    Cataract    Cerebrovascular disease    Cigarette smoker    COPD (chronic obstructive pulmonary disease) (HCC)    mild, no inhalers or oxygen used   Coronary artery disease    no current cardiologist  pt. denies   Deafness in right ear    Depression    Headache    tension headaches due to aneurysm repair with stent   Heart murmur    History of kidney stones    15 to 20 yrs ago   Hyperlipidemia    Hypothyroidism    IBS (irritable bowel syndrome)    Mitral valve prolapse    mild takes beta blocker   Osteopenia    Palpitations    Stroke ([place])    x 3 ministrokes last one feb 2014   Venous insufficiency    Vitamin D deficiency    Family History  Problem Relation Age of Onset   Heart attack Mother    Diabetes Mother    Cancer Father    Diabetes Sister    Diabetes Brother    Cancer Sister        unkown type   Lung cancer Brother        Lung   AAA (abdominal aortic aneurysm) Neg Hx    Colon cancer Neg Hx    Stomach cancer Neg Hx    Esophageal cancer Neg Hx    Liver cancer Neg Hx    Pancreatic cancer Neg Hx    Rectal cancer Neg Hx    Past Surgical History:  Procedure Laterality Date   ANEURYSM COILING  11-10   Dr. Estanislado Pandy   BREAST LUMPECTOMY Right 1986   benign   DIAGNOSTIC LAPAROSCOPY     Proable laparoscopic right colectomy 03-14-18 Dr. Excell Seltzer   EYE SURGERY Bilateral    ioc lens for  cataracts   LAPAROSCOPIC RIGHT COLECTOMY Right 03/14/2018   Procedure: LAPAROSCOPIC ASSISTED  RIGHT COLECTOMY;  Surgeon: Excell Seltzer, MD;  Location: [facility];  Service: General;  Laterality: Right;   LAPAROSCOPY N/A 03/14/2018   Procedure: LAPAROSCOPY;  Surgeon: Excell Seltzer, MD;  Location: [facility];  Service: General;  Laterality: N/A;   LEFT HEART CATHETERIZATION WITH CORONARY ANGIOGRAM N/A 08/05/2014   Procedure: LEFT HEART CATHETERIZATION WITH CORONARY ANGIOGRAM;  Surgeon: Wellington Hampshire, MD;  Location: [facility];  Service: Cardiovascular;  Laterality: N/A;   TONSILLECTOMY     TOTAL ABDOMINAL HYSTERECTOMY  1970   complete   TRANSLABYRINTHINE PROCEDURE  2002   [facility] Dr. Vicie Mutters tumor removal   TRANSURETHRAL RESECTION OF BLADDER TUMOR N/A 02/08/2018   Procedure: TRANSURETHRAL RESECTION OF BLADDER TUMOR (TURBT)/ POSTOPERATIVE INSTILLATION OF CHEMO THERAPY;  Surgeon: Festus Aloe, MD;  Location: [facility];  Service: Urology;  Laterality: N/A;   Social History   Occupational History   Occupation: alterations  Tobacco Use   Smoking status: Every Day    Packs/day: 0.25    Years: 50.00    Pack years: 12.50    Types: Cigarettes   Smokeless tobacco: Never   Tobacco comments:    4-5 cigs daily  is aware she needs to quit  Vaping Use   Vaping Use: Never used  Substance and Sexual Activity   Alcohol use: No    Alcohol/week: 0.0 standard drinks   Drug use: No   Sexual activity: Not Currently

## 2020-12-24 ENCOUNTER — Encounter: Payer: Self-pay | Admitting: Physical Medicine and Rehabilitation

## 2020-12-28 ENCOUNTER — Other Ambulatory Visit: Payer: Self-pay

## 2020-12-28 ENCOUNTER — Encounter: Payer: Self-pay | Admitting: Orthopaedic Surgery

## 2020-12-28 ENCOUNTER — Ambulatory Visit (INDEPENDENT_AMBULATORY_CARE_PROVIDER_SITE_OTHER): Payer: Medicare Other | Admitting: Orthopaedic Surgery

## 2020-12-28 VITALS — BP 115/64 | HR 91 | Ht 62.5 in | Wt 118.0 lb

## 2020-12-28 DIAGNOSIS — G8929 Other chronic pain: Secondary | ICD-10-CM

## 2020-12-28 DIAGNOSIS — M545 Low back pain, unspecified: Secondary | ICD-10-CM | POA: Diagnosis not present

## 2020-12-28 DIAGNOSIS — I6350 Cerebral infarction due to unspecified occlusion or stenosis of unspecified cerebral artery: Secondary | ICD-10-CM

## 2021-01-05 ENCOUNTER — Telehealth: Payer: Self-pay | Admitting: Family Medicine

## 2021-01-05 NOTE — Telephone Encounter (Signed)
I am reviewing schedules and noticed pt is scheduled for TOC to Dr. Gena Fray on 01/21/21. I looked back to April of this year and do not see documentation of approval for TOC. Please advise if ok.

## 2021-01-06 NOTE — Progress Notes (Signed)
Office Visit Note   Patient: Ana Santos           Date of Birth: Sep 02, 1940           MRN: [id number] Visit Date: 12/28/2020              Requested by: Libby Maw, MD [address] [place],  [place] [address] PCP: Libby Maw, MD   Assessment & Plan: Visit Diagnoses:  1. Chronic left-sided low back pain without sciatica     Plan: We will set her up for a left epidural at the L1-2 level with Dr. Ernestina Patches.  She can follow-up with me after the injection.  Follow-Up Instructions: No follow-ups on file.   Orders:  Orders Placed This Encounter  Procedures   Ambulatory referral to Physical Medicine Rehab   No orders of the defined types were placed in this encounter.     Procedures: No procedures performed   Clinical Data: No additional findings.   Subjective: Chief Complaint  Patient presents with   Lower Back - Pain    HPI 80 year old female returns with problems with low back pain.  Patient been taking some oral prednisone states she was doing great while she was taking it and has not weaned off she started having some recurrence of symptoms.  She is using Tylenol and also emu cream.  Her symptoms of left side tend to radiate into the left groin.  MRI showed paracentral disc protrusion L1-2 slightly more left than right.  Review of Systems 14 point system update unchanged from 12/15/2020 office visit.   Objective: Vital Signs: BP 115/64   Pulse 91   Ht 5' 2.5" (1.588 m)   Wt 118 lb (53.5 kg)   BMI 21.24 kg/m   Physical Exam Constitutional:      Appearance: She is well-developed.  HENT:     Head: Normocephalic.     Right Ear: External ear normal.     Left Ear: External ear normal. There is no impacted cerumen.  Eyes:     Pupils: Pupils are equal, round, and reactive to light.  Neck:     Thyroid: No thyromegaly.     Trachea: No tracheal deviation.  Cardiovascular:     Rate and Rhythm: Normal rate.  Pulmonary:      Effort: Pulmonary effort is normal.  Abdominal:     Palpations: Abdomen is soft.  Musculoskeletal:     Cervical back: No rigidity.  Skin:    General: Skin is warm and dry.  Neurological:     Mental Status: She is alert and oriented to person, place, and time.  Psychiatric:        Behavior: Behavior normal.    Ortho Exam neck straight leg raising negative logroll the hips.  She has some decreased sensation proximal left anterior thigh.  Negative Faber right and left.  Knee jerk ankle jerk are intact anterior tib gastrocsoleus is intact.  Specialty Comments:  No specialty comments available.  Imaging: No results found.   PMFS History: Patient Active Problem List   Diagnosis Date Noted   Iron deficiency 02/06/2020   Labyrinthitis 12/08/2019   Left-sided low back pain without sciatica 05/05/2019   Visual loss, transient, bilateral 02/21/2019   Essential hypertension 02/21/2019   Anemia, iron deficiency 06/11/2018   Colonic mass s/p lap ileocectomy 03/14/2018 03/14/2018   Acute lower GI bleeding 03/14/2018   Chronic anticoagulation 03/14/2018   Chronic tension-type headache, not intractable 05/31/2015   Symptomatic  PVCs 08/04/2014   Chest pain 08/03/2014   TIA (transient ischemic attack) 08/26/2013   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   Palpitations 07/09/2013   RLS (restless legs syndrome) 07/04/2012   Cerebral artery occlusion with cerebral infarction ([place]) 03/30/2010   Cerebrovascular disease 07/13/2009   History of cerebral aneurysm 03/26/2009   Vitamin D deficiency 08/10/2008   Venous insufficiency 08/10/2008   Disorder of bone and cartilage 12/18/2007   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[place]) 08/18/2007   Hypothyroidism 08/18/2007   Tobacco use disorder, continuous 08/18/2007   Mixed hyperlipidemia 07/18/2007   Anxiety state 07/18/2007   Mitral valve disorder 07/18/2007   COPD (chronic obstructive pulmonary disease) with chronic bronchitis ([place]) 07/18/2007    Irritable bowel syndrome 07/18/2007   Fibromyalgia 07/18/2007   Past Medical History:  Diagnosis Date   AN (acoustic neuroma) ([place])    Aneurysm ([place])    Anxiety    Cataract    Cerebrovascular disease    Cigarette smoker    COPD (chronic obstructive pulmonary disease) (HCC)    mild, no inhalers or oxygen used   Coronary artery disease    no current cardiologist  pt. denies   Deafness in right ear    Depression    Headache    tension headaches due to aneurysm repair with stent   Heart murmur    History of kidney stones    15 to 20 yrs ago   Hyperlipidemia    Hypothyroidism    IBS (irritable bowel syndrome)    Mitral valve prolapse    mild takes beta blocker   Osteopenia    Palpitations    Stroke ([place])    x 3 ministrokes last one feb 2014   Venous insufficiency    Vitamin D deficiency     Family History  Problem Relation Age of Onset   Heart attack Mother    Diabetes Mother    Cancer Father    Diabetes Sister    Diabetes Brother    Cancer Sister        unkown type   Lung cancer Brother        Lung   AAA (abdominal aortic aneurysm) Neg Hx    Colon cancer Neg Hx    Stomach cancer Neg Hx    Esophageal cancer Neg Hx    Liver cancer Neg Hx    Pancreatic cancer Neg Hx    Rectal cancer Neg Hx     Past Surgical History:  Procedure Laterality Date   ANEURYSM COILING  11-10   Dr. Estanislado Pandy   BREAST LUMPECTOMY Right 1986   benign   DIAGNOSTIC LAPAROSCOPY     Proable laparoscopic right colectomy 03-14-18 Dr. Excell Seltzer   EYE SURGERY Bilateral    ioc lens for cataracts   LAPAROSCOPIC RIGHT COLECTOMY Right 03/14/2018   Procedure: LAPAROSCOPIC ASSISTED  RIGHT COLECTOMY;  Surgeon: Excell Seltzer, MD;  Location: [facility];  Service: General;  Laterality: Right;   LAPAROSCOPY N/A 03/14/2018   Procedure: LAPAROSCOPY;  Surgeon: Excell Seltzer, MD;  Location: [facility];  Service: General;  Laterality: N/A;   LEFT HEART CATHETERIZATION WITH CORONARY ANGIOGRAM N/A 08/05/2014    Procedure: LEFT HEART CATHETERIZATION WITH CORONARY ANGIOGRAM;  Surgeon: Wellington Hampshire, MD;  Location: [facility];  Service: Cardiovascular;  Laterality: N/A;   TONSILLECTOMY     TOTAL ABDOMINAL HYSTERECTOMY  1970   complete   TRANSLABYRINTHINE PROCEDURE  2002   [facility] Dr. Vicie Mutters tumor removal   TRANSURETHRAL RESECTION OF  BLADDER TUMOR N/A 02/08/2018   Procedure: TRANSURETHRAL RESECTION OF BLADDER TUMOR (TURBT)/ POSTOPERATIVE INSTILLATION OF CHEMO THERAPY;  Surgeon: Festus Aloe, MD;  Location: [facility];  Service: Urology;  Laterality: N/A;   Social History   Occupational History   Occupation: alterations  Tobacco Use   Smoking status: Every Day    Packs/day: 0.25    Years: 50.00    Pack years: 12.50    Types: Cigarettes   Smokeless tobacco: Never   Tobacco comments:    4-5 cigs daily  is aware she needs to quit  Vaping Use   Vaping Use: Never used  Substance and Sexual Activity   Alcohol use: No    Alcohol/week: 0.0 standard drinks   Drug use: No   Sexual activity: Not Currently

## 2021-01-11 ENCOUNTER — Encounter: Payer: Self-pay | Admitting: Physical Medicine and Rehabilitation

## 2021-01-11 ENCOUNTER — Other Ambulatory Visit: Payer: Self-pay

## 2021-01-11 ENCOUNTER — Ambulatory Visit (INDEPENDENT_AMBULATORY_CARE_PROVIDER_SITE_OTHER): Payer: Medicare Other | Admitting: Physical Medicine and Rehabilitation

## 2021-01-11 ENCOUNTER — Ambulatory Visit: Payer: Self-pay

## 2021-01-11 VITALS — BP 109/75 | HR 69

## 2021-01-11 DIAGNOSIS — M5416 Radiculopathy, lumbar region: Secondary | ICD-10-CM | POA: Diagnosis not present

## 2021-01-11 MED ORDER — DEXAMETHASONE SODIUM PHOSPHATE 10 MG/ML IJ SOLN
15.0000 mg | Freq: Once | INTRAMUSCULAR | Status: AC
  Administered 2021-01-11: 15 mg

## 2021-01-11 NOTE — Progress Notes (Signed)
Pt state lower back pain that travels to her left hip. Pt state walking and getting out of bed in the morning makes the pain worse. Pt state she take over the counter pain meds to help ease her pain.  Numeric Pain Rating Scale and Functional Assessment Average Pain 8   In the last MONTH (on 0-10 scale) has pain interfered with the following?  1. General activity like being  able to carry out your everyday physical activities such as walking, climbing stairs, carrying groceries, or moving a chair?  Rating(10)   +Driver, +BT, -Dye Allergies.

## 2021-01-11 NOTE — Patient Instructions (Signed)

## 2021-01-21 ENCOUNTER — Encounter: Payer: Self-pay | Admitting: Family Medicine

## 2021-01-21 ENCOUNTER — Other Ambulatory Visit: Payer: Self-pay

## 2021-01-21 ENCOUNTER — Ambulatory Visit (INDEPENDENT_AMBULATORY_CARE_PROVIDER_SITE_OTHER): Payer: Medicare Other | Admitting: Family Medicine

## 2021-01-21 VITALS — BP 116/58 | HR 64 | Temp 97.0°F | Wt 119.4 lb

## 2021-01-21 DIAGNOSIS — E039 Hypothyroidism, unspecified: Secondary | ICD-10-CM | POA: Diagnosis not present

## 2021-01-21 DIAGNOSIS — G44229 Chronic tension-type headache, not intractable: Secondary | ICD-10-CM | POA: Diagnosis not present

## 2021-01-21 DIAGNOSIS — I10 Essential (primary) hypertension: Secondary | ICD-10-CM

## 2021-01-21 DIAGNOSIS — M545 Low back pain, unspecified: Secondary | ICD-10-CM

## 2021-01-21 DIAGNOSIS — G8929 Other chronic pain: Secondary | ICD-10-CM

## 2021-01-21 DIAGNOSIS — E782 Mixed hyperlipidemia: Secondary | ICD-10-CM | POA: Diagnosis not present

## 2021-01-21 DIAGNOSIS — D508 Other iron deficiency anemias: Secondary | ICD-10-CM

## 2021-01-21 DIAGNOSIS — F418 Other specified anxiety disorders: Secondary | ICD-10-CM | POA: Diagnosis not present

## 2021-01-21 DIAGNOSIS — M9979 Connective tissue and disc stenosis of intervertebral foramina of abdomen and other regions: Secondary | ICD-10-CM

## 2021-01-21 DIAGNOSIS — Z8551 Personal history of malignant neoplasm of bladder: Secondary | ICD-10-CM | POA: Insufficient documentation

## 2021-01-21 DIAGNOSIS — F17209 Nicotine dependence, unspecified, with unspecified nicotine-induced disorders: Secondary | ICD-10-CM | POA: Diagnosis not present

## 2021-01-21 DIAGNOSIS — E559 Vitamin D deficiency, unspecified: Secondary | ICD-10-CM | POA: Diagnosis not present

## 2021-01-21 DIAGNOSIS — J449 Chronic obstructive pulmonary disease, unspecified: Secondary | ICD-10-CM | POA: Diagnosis not present

## 2021-01-21 DIAGNOSIS — G2581 Restless legs syndrome: Secondary | ICD-10-CM

## 2021-01-21 DIAGNOSIS — M519 Unspecified thoracic, thoracolumbar and lumbosacral intervertebral disc disorder: Secondary | ICD-10-CM | POA: Insufficient documentation

## 2021-01-21 LAB — LIPID PANEL
Cholesterol: 170 mg/dL (ref 0–200)
HDL: 55.3 mg/dL (ref >39.00)
LDL Cholesterol: 78 mg/dL (ref 0–99)
NonHDL: 114.49
Total CHOL/HDL Ratio: 3
Triglycerides: 181 mg/dL — ABNORMAL HIGH (ref 0.0–149.0)
VLDL: 36.2 mg/dL (ref 0.0–40.0)

## 2021-01-21 LAB — COMPREHENSIVE METABOLIC PANEL
ALT: 6 U/L (ref 0–35)
AST: 13 U/L (ref 0–37)
Albumin: 3.9 g/dL (ref 3.5–5.2)
Alkaline Phosphatase: 111 U/L (ref 39–117)
BUN: 14 mg/dL (ref 6–23)
CO2: 25 mEq/L (ref 19–32)
Calcium: 9.7 mg/dL (ref 8.4–10.5)
Chloride: 107 mEq/L (ref 96–112)
Creatinine, Ser: 0.99 mg/dL (ref 0.40–1.20)
GFR: 54.03 mL/min — ABNORMAL LOW (ref >60.00)
Glucose, Bld: 80 mg/dL (ref 70–99)
Potassium: 5 mEq/L (ref 3.5–5.1)
Sodium: 141 mEq/L (ref 135–145)
Total Bilirubin: 0.4 mg/dL (ref 0.2–1.2)
Total Protein: 7 g/dL (ref 6.0–8.3)

## 2021-01-21 LAB — CBC
HCT: 39 % (ref 36.0–46.0)
Hemoglobin: 12.4 g/dL (ref 12.0–15.0)
MCHC: 31.8 g/dL (ref 30.0–36.0)
MCV: 90.2 fl (ref 78.0–100.0)
Platelets: 342 10*3/uL (ref 150.0–400.0)
RBC: 4.32 Mil/uL (ref 3.87–5.11)
RDW: 15 % (ref 11.5–15.5)
WBC: 11.4 10*3/uL — ABNORMAL HIGH (ref 4.0–10.5)

## 2021-01-21 LAB — TSH: TSH: 6.2 u[IU]/mL — ABNORMAL HIGH (ref 0.35–5.50)

## 2021-01-21 LAB — VITAMIN D 25 HYDROXY (VIT D DEFICIENCY, FRACTURES): VITD: 56.25 ng/mL (ref 30.00–100.00)

## 2021-01-21 MED ORDER — SERTRALINE HCL 25 MG PO TABS: 25.0000 mg | ORAL_TABLET | Freq: Every day | ORAL | 5 refills | Status: DC

## 2021-01-21 NOTE — Progress Notes (Addendum)
[facility] [address] [place] [address] Dept: [phone] Dept Fax: [phone]  Transfer of Care Office Visit  Subjective:    Patient ID: Ana Santos, female    DOB: [birth year], 80 y.o..   MRN: [id number]  Chief Complaint  Patient presents with   Transitions Of Care    Pt c/o lack of appetite and not able to keep food down x 63month    History of Present Illness:  Patient is in today to establish care. [name] originally from [place] She had 5 sons from her former husband, whom she divorced. She has been married to her current husband (who retired from [employer] for 40 years. One her sons has died form suicide.She is a retired sRegulatory affairs officer She smokes 1/4 ppd of cigarettes. She has been gradually decreasing her cigarette use over the years. She feels she will eventually quit. [name] any use of alcohol or drugs.  Ms. BBrockwayhas a history of hypertension, managed with amlodipine. She also has prior mitral valve disease and PVS. She also has hyperlipidemia, which is managed with pravastatin.  Ms. BBlauerhas a history of hypothyroidism. She recently had an increase in her Synthroid dose.  Ms. BMcmanamyhas a history of a prior aneurysm of a basilar artery. She underwent a procedure to place coils int his, but then had a coil cause a blockage of the right anterior inferior cerebellar artery.  She now has chronic "ice-pick" headaches and has had some imbalance issues.  [name] a history of COPD. She only occasionally use an albuterol inhaler for this.  [name] a history of a past lower GI bleed with resulting iron deficiency anemia. She notes the source of the bleeding was never determined.  [name] a history of a right acoustic neuroma, which was excised. She does get episodes of vertigo that apparently have been felt to be due to labyrinthitis.  [name] a  history of Vitamin D deficiency. She is on a replacement dose of Vitamin D.  [name] a history of a fall in Sept. 2021. She had been on a stool, watering some flowers and fell to the ground. She has had chronic low back pain since then. She is currently managed by Dr. DDonivan Scull An MRI scan in may showed multi-level degenerative lumbar disease with foraminal stenoses. She has undergone multiple ESIs without improvement. She notes that she does not take pain medicine beyond Tylenol. She apparently was treated in the past with Tramadol for her chronic headaches. Although this helped, at one point she was suddenly without medication. She experienced a significant opioid withdrawal and has been unwilling to restart any opioids since. She does find the pain to be quite difficult to manage with. She has chronic nausea that she relates to her pain. She has noted some depressed mood related to all of this and finds it difficult to cope. She is managed on gabapentin, but only at bedtime.  [name] a history of RLS. She is on Mirapex. She notes this does help, as she is unable to sleep without this.  [name] a past history of some sort of abdominal mass which required a partial bowel removal. She is unsure what this was, but notes it was not cancerous.  Ms. BHananhas a history of bladder cancer. She notes she had a procedure for excision and is followed by urology.  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 ([place]) 03/11/2014   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   RLS (restless legs syndrome) 07/04/2012   History of cerebral aneurysm 03/26/2009   Vitamin D deficiency 08/10/2008   Venous insufficiency 08/10/2008   Disorder of bone and cartilage 12/18/2007   History of acoustic neuroma 08/18/2007   Hypothyroidism 08/18/2007   Tobacco use disorder, continuous 08/18/2007   Mixed hyperlipidemia 07/18/2007   Anxiety with depression 07/18/2007   Mitral valve disorder 07/18/2007   COPD (chronic obstructive pulmonary disease) with chronic bronchitis ([facility]) 07/18/2007   Irritable bowel syndrome 07/18/2007   Fibromyalgia 07/18/2007   Past Surgical History:  Procedure Laterality Date   ANEURYSM COILING  11-10   Dr. Estanislado Pandy   BREAST LUMPECTOMY Right 1986   benign   DIAGNOSTIC LAPAROSCOPY     Proable laparoscopic right colectomy 03-14-18 Dr. Excell Seltzer   EYE SURGERY Bilateral    ioc lens for cataracts   LAPAROSCOPIC RIGHT COLECTOMY Right 03/14/2018   Procedure: LAPAROSCOPIC ASSISTED  RIGHT COLECTOMY;  Surgeon: Excell Seltzer, MD;  Location: [facility];  Service: General;  Laterality: Right;   LAPAROSCOPY N/A 03/14/2018   Procedure: LAPAROSCOPY;  Surgeon: Excell Seltzer, MD;  Location: [facility];  Service: General;  Laterality: N/A;   LEFT HEART CATHETERIZATION WITH CORONARY ANGIOGRAM N/A 08/05/2014   Procedure: LEFT HEART CATHETERIZATION WITH CORONARY ANGIOGRAM;  Surgeon: Wellington Hampshire, MD;  Location: [facility];  Service: Cardiovascular;  Laterality: N/A;   TONSILLECTOMY     TOTAL ABDOMINAL HYSTERECTOMY  1970   complete   TRANSLABYRINTHINE PROCEDURE  2002   [facility] Dr. Vicie Mutters tumor removal   TRANSURETHRAL RESECTION OF BLADDER TUMOR N/A 02/08/2018   Procedure: TRANSURETHRAL RESECTION OF BLADDER TUMOR (TURBT)/ POSTOPERATIVE INSTILLATION OF CHEMO THERAPY;  Surgeon: Festus Aloe, MD;  Location: [facility];  Service: Urology;   Laterality: N/A;   Family History  Problem Relation Age of Onset   Heart attack Mother    Diabetes Mother    Cancer Father    Diabetes Sister    Diabetes Brother    Cancer Sister        unkown type   Lung cancer Brother        Lung   AAA (abdominal aortic aneurysm) Neg Hx    Colon cancer Neg Hx    Stomach cancer Neg Hx    Esophageal cancer Neg Hx    Liver cancer Neg Hx    Pancreatic cancer Neg Hx    Rectal cancer Neg Hx    Outpatient Medications Prior to Visit  Medication Sig Dispense Refill   acetaminophen (TYLENOL) 500 MG tablet Take 500 mg by mouth every 6 (six) hours as needed for mild pain.     albuterol (VENTOLIN HFA) 108 (90 Base) MCG/ACT inhaler      amLODipine (NORVASC) 5 MG tablet TAKE 1 TABLET DAILY 90 tablet 0   clopidogrel (PLAVIX) 75 MG tablet Take 1 tablet (75 mg total) by mouth daily. 90 tablet 3   gabapentin (NEURONTIN)  100 MG capsule Take one at night times 1 week then 2 po q HS 60 capsule 2   hydroxypropyl methylcellulose / hypromellose (ISOPTO TEARS / GONIOVISC) 2.5 % ophthalmic solution Place 1 drop into both eyes 2 (two) times daily.     levothyroxine (SYNTHROID) 88 MCG tablet Take 1 tablet (88 mcg total) by mouth daily. 90 tablet 0   ondansetron (ZOFRAN) 4 MG tablet Take 1 tablet (4 mg total) by mouth every 8 (eight) hours as needed for nausea or vomiting. 20 tablet 0   pramipexole (MIRAPEX) 0.5 MG tablet Take one nightly one hour prior to going to bed as needed for restless leg syndrome. 90 tablet 4   pravastatin (PRAVACHOL) 40 MG tablet TAKE 1 TABLET DAILY 90 tablet 3   Vitamin D, Ergocalciferol, (DRISDOL) 1.25 MG (50000 UNIT) CAPS capsule Take 1 capsule (50,000 Units total) by mouth every 7 (seven) days. 5 capsule 3   clopidogrel (PLAVIX) 75 MG tablet Take 1 tablet (75 mg total) by mouth daily. 30 tablet 0   predniSONE (DELTASONE) 50 MG tablet Take 1 tablet (50 mg total) by mouth daily with breakfast. Take until completed. 2 tablet 0   No  facility-administered medications prior to visit.   Allergies  Allergen Reactions   Prednisone Other (See Comments)    Hallucinations   Atorvastatin Other (See Comments)     Lipitor caused arm pain (3/09)   Hydrocodone-Acetaminophen Other (See Comments)    hallucinations   Morphine Other (See Comments)    hallucinations   Nortriptyline     Caused nausea and vomiting and shaking, kept her up all night   Topamax [Topiramate] Nausea And Vomiting    *dizziness*   Tramadol     Objective:   Today's Vitals   01/21/21 1032  BP: (!) 116/58  Pulse: 64  Temp: (!) 97 F (36.1 C)  TempSrc: Temporal  SpO2: 96%  Weight: 119 lb 6.4 oz (54.2 kg)  PainSc: 10-Worst pain ever   Body mass index is 21.49 kg/m.   General: Well developed, well nourished. No acute distress. Abdomen: Soft, non-tender. No hepatosplenomegaly. No rebound or guarding. Extremities: No edema noted. Psych: Alert and oriented. Tearful at times with mildly depressed affect.  Health Maintenance Due  Topic Date Due   TETANUS/TDAP  Never done   Zoster Vaccines- Shingrix (1 of 2) Never done   INFLUENZA VACCINE  01/10/2021   Imaging: MRI of Lumbar Spine (10/24/2019) IMPRESSION: 1. Multilevel degenerative changes of the lumbar spine, as described above, slightly progressed at L4-L5 with moderate right and mild left foraminal stenosis and mild bilateral subarticular recess stenosis. 2. New small left paracentral disc protrusion at L1-L2. 3. Similar degree of mild right foraminal stenosis at L5-S1 and mild left foraminal stenosis at L3-L4. 4. No significant canal stenosis at any level.    Assessment & Plan:   1. Essential hypertension Blood pressure is at goal. We will continue amlodipine. I will check renal function and electrolytes.  - Comprehensive metabolic panel  2. COPD (chronic obstructive pulmonary disease) with chronic bronchitis (HCC) Stable. Uses PRN albuterol.  3. Acquired hypothyroidism Due for  recheck of TSH since levothyroxine increased to 88 mcg daily. - TSH  4. Chronic tension-type headache, not intractable Daily, continuous headaches. Poorly managed with Tylenol, but patient is apparopriately concerned abotu trying to take opioids again for this.  5. Vitamin D deficiency Due for reassessment.  - VITAMIN D 25 Hydroxy (Vit-D Deficiency, Fractures)  6. Mixed hyperlipidemia On lovastatin. We  will reassess lipids.  - Lipid panel  7. Anxiety with depression Ms. Stead appears to be having some depression, likely brought on by her chronic pain issues. I will start her on a low dose of Zoloft and reassess in 3 months.  - sertraline (ZOLOFT) 25 MG tablet; Take 1 tablet (25 mg total) by mouth daily.  Dispense: 30 tablet; Refill: 5  8. RLS (restless legs syndrome) Stable on Mirapex.  9. Other iron deficiency anemia Past history of GI blood loss. I will monitor for anemia.  - CBC  10. Chronic left-sided low back pain without sciatica 11. Foraminal stenosis due to intervertebral disc disease Followed by orthopedics.  12. Tobacco use disorder, continuous I advised Ms. Reinitz to stop smoking. We discuss that it was never too late to quit. Will continue to address with her over time.  I spent 65 minutes reviewing prior medical records, imaging, and labs, taking a history with focused physical examination, developing a plan, and documenting her encounter.  Haydee Salter, MD

## 2021-01-21 NOTE — Progress Notes (Signed)
Ana Santos - 80 y.o. female MRN [id number]  Date of birth: [birth year]  Office Visit Note: Visit Date: 01/11/2021 PCP: Libby Maw, MD Referred by: Libby Maw,*  Subjective: Chief Complaint  Patient presents with   Lower Back - Pain   Left Hip - Pain   HPI:  Ana Santos is a 80 y.o. female who comes in today at the request of Dr. Rodell Perna for planned Left L1-L2 Lumbar Transforaminal epidural steroid injection with fluoroscopic guidance.  The patient has failed conservative care including home exercise, medications, time and activity modification.  This injection will be diagnostic and hopefully therapeutic.  Please see requesting physician notes for further details and justification. MRI reviewed with images and spine model.  MRI reviewed in the note below.     ROS Otherwise per HPI.  Assessment & Plan: Visit Diagnoses:    ICD-10-CM   1. Lumbar radiculopathy  M54.16 XR C-ARM NO REPORT    Epidural Steroid injection    dexamethasone (DECADRON) injection 15 mg      Plan: No additional findings.   Meds & Orders:  Meds ordered this encounter  Medications   dexamethasone (DECADRON) injection 15 mg    Orders Placed This Encounter  Procedures   XR C-ARM NO REPORT   Epidural Steroid injection    Follow-up: Return in about 2 weeks (around 01/25/2021) for Rodell Perna, MD.   Procedures: No procedures performed  Lumbosacral Transforaminal Epidural Steroid Injection - Sub-Pedicular Approach with Fluoroscopic Guidance  Patient: Ana Santos      Date of Birth: 12/17/1940 MRN: [id number] PCP: Libby Maw, MD      Visit Date: 01/11/2021   Universal Protocol:    Date/Time: 01/11/2021  Consent Given By: the patient  Position: PRONE  Additional Comments: Vital signs were monitored before and after the procedure. Patient was prepped and draped in the usual sterile fashion. The correct patient, procedure, and site was  verified.   Injection Procedure Details:   Procedure diagnoses: Lumbar radiculopathy [M54.16]    Meds Administered:  Meds ordered this encounter  Medications   dexamethasone (DECADRON) injection 15 mg    Laterality: Left  Location/Site:  L1-L2  Needle:5.0 in., 22 ga.  Short bevel or Quincke spinal needle  Needle Placement: Transforaminal  Findings:    -Comments: Excellent flow of contrast along the nerve, nerve root and into the epidural space.  Procedure Details: After squaring off the end-plates to get a true AP view, the C-arm was positioned so that an oblique view of the foramen as noted above was visualized. The target area is just inferior to the "nose of the scotty dog" or sub pedicular. The soft tissues overlying this structure were infiltrated with 2-3 ml. of 1% Lidocaine without Epinephrine.  The spinal needle was inserted toward the target using a "trajectory" view along the fluoroscope beam.  Under AP and lateral visualization, the needle was advanced so it did not puncture dura and was located close the 6 O'Clock position of the pedical in AP tracterory. Biplanar projections were used to confirm position. Aspiration was confirmed to be negative for CSF and/or blood. A 1-2 ml. volume of Isovue-250 was injected and flow of contrast was noted at each level. Radiographs were obtained for documentation purposes.   After attaining the desired flow of contrast documented above, a 0.5 to 1.0 ml test dose of 0.25% Marcaine was injected into each respective transforaminal space.  The patient was observed for 90 seconds  post injection.  After no sensory deficits were reported, and normal lower extremity motor function was noted,   the above injectate was administered so that equal amounts of the injectate were placed at each foramen (level) into the transforaminal epidural space.   Additional Comments:  The patient tolerated the procedure well Dressing: 2 x 2 sterile gauze and  Band-Aid    Post-procedure details: Patient was observed during the procedure. Post-procedure instructions were reviewed.  Patient left the clinic in stable condition.    Clinical History: MRI LUMBAR SPINE WITHOUT CONTRAST   TECHNIQUE: Multiplanar, multisequence MR imaging of the lumbar spine was performed. No intravenous contrast was administered.   COMPARISON:  MRI 03/02/2020   FINDINGS: Segmentation:  Standard.   Alignment: Unchanged trace anterolisthesis L5 on S1. Slight dextrocurvature.   Vertebrae:  No fracture, evidence of discitis, or bone lesion.   Conus medullaris and cauda equina: Conus extends to the L1-2 level. Conus and cauda equina appear normal.   Paraspinal and other soft tissues: Probable bilateral renal cysts, unchanged.   Disc levels:   T12-L1: No significant disc protrusion, foraminal stenosis, or canal stenosis. Unchanged.   L1-L2: Disc height loss with mild circumferential disc bulge. New small left paracentral disc protrusion with slight inferior migration of disc material. No significant foraminal or canal stenosis.   L2-L3: Mild circumferential disc bulge and left greater than right facet arthropathy resulting in mild left subarticular recess narrowing. No significant foraminal or canal stenosis. Unchanged.   L3-L4: Circumferential disc bulge with bilateral facet arthropathy and ligamentum flavum buckling. Findings result in mild left foraminal stenosis and mild left greater than right subarticular recess stenosis. No significant canal stenosis. Unchanged.   L4-L5: Circumferential disc bulge with bilateral facet arthropathy resulting in moderate right and mild left foraminal stenosis with mild bilateral subarticular recess stenosis. No significant canal stenosis. Findings have slightly progressed from prior.   L5-S1: Mild disc uncovering with mild diffuse disc bulge. Advanced right and mild left facet arthropathy. Similar degree of  mild right foraminal stenosis. No canal stenosis. Unchanged.   IMPRESSION: 1. Multilevel degenerative changes of the lumbar spine, as described above, slightly progressed at L4-L5 with moderate right and mild left foraminal stenosis and mild bilateral subarticular recess stenosis. 2. New small left paracentral disc protrusion at L1-L2. 3. Similar degree of mild right foraminal stenosis at L5-S1 and mild left foraminal stenosis at L3-L4. 4. No significant canal stenosis at any level.     Electronically Signed   By: Davina Poke D.O.   On: 10/25/2020 09:22     Objective:  VS:  HT:    WT:   BMI:     BP:109/75  HR:69bpm  TEMP: ( )  RESP:  Physical Exam Vitals and nursing note reviewed.  Constitutional:      General: She is not in acute distress.    Appearance: Normal appearance. She is not ill-appearing.  HENT:     Head: Normocephalic and atraumatic.     Right Ear: External ear normal.     Left Ear: External ear normal.  Eyes:     Extraocular Movements: Extraocular movements intact.  Cardiovascular:     Rate and Rhythm: Normal rate.     Pulses: Normal pulses.  Pulmonary:     Effort: Pulmonary effort is normal. No respiratory distress.  Abdominal:     General: There is no distension.     Palpations: Abdomen is soft.  Musculoskeletal:        General: Tenderness  present.     Cervical back: Neck supple.     Right lower leg: No edema.     Left lower leg: No edema.     Comments: Patient has good distal strength with no pain over the greater trochanters.  No clonus or focal weakness.  Skin:    Findings: No erythema, lesion or rash.  Neurological:     General: No focal deficit present.     Mental Status: She is alert and oriented to person, place, and time.     Sensory: No sensory deficit.     Motor: No weakness or abnormal muscle tone.     Coordination: Coordination normal.  Psychiatric:        Mood and Affect: Mood normal.        Behavior: Behavior normal.      Imaging: No results found.

## 2021-01-21 NOTE — Addendum Note (Signed)
Addended by: Haydee Salter on: 01/21/2021 06:28 PM   Modules accepted: Orders

## 2021-01-21 NOTE — Procedures (Signed)
Lumbosacral Transforaminal Epidural Steroid Injection - Sub-Pedicular Approach with Fluoroscopic Guidance  Patient: Ana Santos      Date of Birth: [birth year] MRN: [id number] PCP: Libby Maw, MD      Visit Date: 01/11/2021   Universal Protocol:    Date/Time: 01/11/2021  Consent Given By: the patient  Position: PRONE  Additional Comments: Vital signs were monitored before and after the procedure. Patient was prepped and draped in the usual sterile fashion. The correct patient, procedure, and site was verified.   Injection Procedure Details:   Procedure diagnoses: Lumbar radiculopathy [M54.16]    Meds Administered:  Meds ordered this encounter  Medications   dexamethasone (DECADRON) injection 15 mg    Laterality: Left  Location/Site:  L1-L2  Needle:5.0 in., 22 ga.  Short bevel or Quincke spinal needle  Needle Placement: Transforaminal  Findings:    -Comments: Excellent flow of contrast along the nerve, nerve root and into the epidural space.  Procedure Details: After squaring off the end-plates to get a true AP view, the C-arm was positioned so that an oblique view of the foramen as noted above was visualized. The target area is just inferior to the "nose of the scotty dog" or sub pedicular. The soft tissues overlying this structure were infiltrated with 2-3 ml. of 1% Lidocaine without Epinephrine.  The spinal needle was inserted toward the target using a "trajectory" view along the fluoroscope beam.  Under AP and lateral visualization, the needle was advanced so it did not puncture dura and was located close the 6 O'Clock position of the pedical in AP tracterory. Biplanar projections were used to confirm position. Aspiration was confirmed to be negative for CSF and/or blood. A 1-2 ml. volume of Isovue-250 was injected and flow of contrast was noted at each level. Radiographs were obtained for documentation purposes.   After attaining the desired flow of  contrast documented above, a 0.5 to 1.0 ml test dose of 0.25% Marcaine was injected into each respective transforaminal space.  The patient was observed for 90 seconds post injection.  After no sensory deficits were reported, and normal lower extremity motor function was noted,   the above injectate was administered so that equal amounts of the injectate were placed at each foramen (level) into the transforaminal epidural space.   Additional Comments:  The patient tolerated the procedure well Dressing: 2 x 2 sterile gauze and Band-Aid    Post-procedure details: Patient was observed during the procedure. Post-procedure instructions were reviewed.  Patient left the clinic in stable condition.

## 2021-01-24 ENCOUNTER — Telehealth: Payer: Self-pay

## 2021-01-24 ENCOUNTER — Telehealth: Payer: Self-pay | Admitting: Family Medicine

## 2021-01-24 NOTE — Telephone Encounter (Signed)
Pt is waning a call concerning her most recent lab results. Please advise

## 2021-01-24 NOTE — Telephone Encounter (Signed)
Called and lvm for patient to cb. Sw, cma

## 2021-01-24 NOTE — Telephone Encounter (Signed)
-----   Message from Haydee Salter, MD sent at 01/21/2021  6:27 PM EDT ----- Kidney function is mildly decreased and stable.  Lipids are at goal on therapy.  Vitamin D level is normal. Once you complete your current prescription, you can switch to a once a day OTC Vitamin D supplement.  TSH is still mildly high. We will recheck in 3 months. If this remains high, we may need ot further increase your levothyroxine dose.   No sign of anemia.

## 2021-01-25 ENCOUNTER — Ambulatory Visit: Payer: Medicare Other | Admitting: Orthopaedic Surgery

## 2021-01-26 ENCOUNTER — Encounter: Payer: Self-pay | Admitting: Orthopaedic Surgery

## 2021-01-26 ENCOUNTER — Ambulatory Visit (INDEPENDENT_AMBULATORY_CARE_PROVIDER_SITE_OTHER): Payer: Medicare Other | Admitting: Orthopaedic Surgery

## 2021-01-26 ENCOUNTER — Other Ambulatory Visit: Payer: Self-pay

## 2021-01-26 DIAGNOSIS — M5126 Other intervertebral disc displacement, lumbar region: Secondary | ICD-10-CM | POA: Diagnosis not present

## 2021-01-26 DIAGNOSIS — I6350 Cerebral infarction due to unspecified occlusion or stenosis of unspecified cerebral artery: Secondary | ICD-10-CM | POA: Diagnosis not present

## 2021-01-26 NOTE — Progress Notes (Signed)
Office Visit Note   Patient: Ana Santos           Date of Birth: [birth year]           MRN: [id number] Visit Date: 01/26/2021              Requested by: Libby Maw, MD [place],  [place] [address] PCP: Haydee Salter, MD   Assessment & Plan: Visit Diagnoses:  1. Herniated intervertebral disc of lumbar spine     Plan: Patient with fall in September persistent symptoms with L1-L2 HNP caudal migration fragment with lateral recess compression from migrated caudal fragment.  Plan microdiscectomy overnight stay.  We reviewed MRI scan from 2021 as well as more recent scan that showed disc herniation with compression at the left L1-2 level.  Risk surgery discussed including risks of anesthesia.  She would need preoperative medical clearance with her history of anterior inferior cerebellar artery occlusion with CVA and smoking history.  Risks of dural tear spinal fluid leakage repeat disc herniation discussed questions elicited and answered she understands request we proceed.  Follow-Up Instructions: No follow-ups on file.   Orders:  No orders of the defined types were placed in this encounter.  No orders of the defined types were placed in this encounter.     Procedures: No procedures performed   Clinical Data: No additional findings.   Subjective: Chief Complaint  Patient presents with   Lower Back - Pain, Follow-up    HPI 80 year old female returns with ongoing severe back pain upper lumbar region radiating on the left side around toward her hip.  Onset was in September 2021 when she was standing on a chair at home and fell off the chair.  She has had 3 epidurals done at [facility].  Recent epidural with Dr.Newton on the left at the L1-2 level where she has disc herniation with caudad migration at L1 to on the left and she got relief for several days and then recurrence by the weekend after her injection on a Tuesday.  She has been using  Tylenol, blue emu.  No chills or fever and no associated bowel or bladder symptoms.  Patient gets up in the morning she has great difficulty walking.  She gets relief if she is in a nonweightbearing position supine.  No pain on the right side.  Good relief with left L1-2 epidural only for a few days.  Patient rates her pain is severe it bothers her on a daily basis for months.  She states she has to get something done.  Anti-inflammatories, muscle relaxants, epidural steroid injections x4 have been not effective.  She states she is ready for surgery.  Past history of CVA with MRI 01/31/2019 showing mild white matter atrophy remote lacunar infarct right cerebellum stable.  History of COPD she still smokes.  History of PVCs.  Negative for MI.  Review of Systems 14 point systems noncontributory other than as mentioned above.   Objective: Vital Signs: BP (!) 163/79   Pulse 87   Ht 5' 2.5" (1.588 m)   Wt 117 lb (53.1 kg)   SpO2 95%   BMI 21.06 kg/m   Physical Exam Constitutional:      Appearance: She is well-developed.  HENT:     Head: Normocephalic.     Right Ear: External ear normal.     Left Ear: External ear normal. There is no impacted cerumen.  Eyes:     Pupils: Pupils are  equal, round, and reactive to light.  Neck:     Thyroid: No thyromegaly.     Trachea: No tracheal deviation.  Cardiovascular:     Rate and Rhythm: Normal rate.  Pulmonary:     Effort: Pulmonary effort is normal.  Abdominal:     Palpations: Abdomen is soft.  Musculoskeletal:     Cervical back: No rigidity.  Skin:    General: Skin is warm and dry.  Neurological:     Mental Status: She is alert and oriented to person, place, and time.  Psychiatric:        Behavior: Behavior normal.    Ortho Exam patient slow getting from sitting standing she unloads herself in a sitting position with locked elbows and hands on the chair.  Increased pain with upright position.  Negative logroll the hips.  Some discomfort  with straight leg raising mild on the left.  Knee and ankle jerk are intact.  Patient has hip flexion weakness on the left.  No quad atrophy abductors are strong anterior tib EHL gastrocsoleus is normal.  Specialty Comments:  No specialty comments available.  Imaging: CLINICAL DATA:  Left-sided low back pain. History of fall in September of 2021   EXAM: MRI LUMBAR SPINE WITHOUT CONTRAST   TECHNIQUE: Multiplanar, multisequence MR imaging of the lumbar spine was performed. No intravenous contrast was administered.   COMPARISON:  MRI 03/02/2020   FINDINGS: Segmentation:  Standard.   Alignment: Unchanged trace anterolisthesis L5 on S1. Slight dextrocurvature.   Vertebrae:  No fracture, evidence of discitis, or bone lesion.   Conus medullaris and cauda equina: Conus extends to the L1-2 level. Conus and cauda equina appear normal.   Paraspinal and other soft tissues: Probable bilateral renal cysts, unchanged.   Disc levels:   T12-L1: No significant disc protrusion, foraminal stenosis, or canal stenosis. Unchanged.   L1-L2: Disc height loss with mild circumferential disc bulge. New small left paracentral disc protrusion with slight inferior migration of disc material. No significant foraminal or canal stenosis.   L2-L3: Mild circumferential disc bulge and left greater than right facet arthropathy resulting in mild left subarticular recess narrowing. No significant foraminal or canal stenosis. Unchanged.   L3-L4: Circumferential disc bulge with bilateral facet arthropathy and ligamentum flavum buckling. Findings result in mild left foraminal stenosis and mild left greater than right subarticular recess stenosis. No significant canal stenosis. Unchanged.   L4-L5: Circumferential disc bulge with bilateral facet arthropathy resulting in moderate right and mild left foraminal stenosis with mild bilateral subarticular recess stenosis. No significant canal stenosis. Findings  have slightly progressed from prior.   L5-S1: Mild disc uncovering with mild diffuse disc bulge. Advanced right and mild left facet arthropathy. Similar degree of mild right foraminal stenosis. No canal stenosis. Unchanged.   IMPRESSION: 1. Multilevel degenerative changes of the lumbar spine, as described above, slightly progressed at L4-L5 with moderate right and mild left foraminal stenosis and mild bilateral subarticular recess stenosis. 2. New small left paracentral disc protrusion at L1-L2. 3. Similar degree of mild right foraminal stenosis at L5-S1 and mild left foraminal stenosis at L3-L4. 4. No significant canal stenosis at any level.     Electronically Signed   By: Davina Poke D.O.   On: 10/25/2020 09:22     PMFS History: Patient Active Problem List   Diagnosis Date Noted   Herniated intervertebral disc of lumbar spine 01/26/2021   Foraminal stenosis due to intervertebral disc disease 01/21/2021   History of bladder cancer 01/21/2021  Labyrinthitis 12/08/2019   Left-sided low back pain without sciatica 05/05/2019   Visual loss, transient, bilateral 02/21/2019   Essential hypertension 02/21/2019   Anemia, iron deficiency 06/11/2018   Colonic mass s/p lap ileocectomy 03/14/2018 03/14/2018   Acute lower GI bleeding 03/14/2018   Chronic anticoagulation 03/14/2018   Chronic tension-type headache, not intractable 05/31/2015   Symptomatic PVCs 08/04/2014   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 ([place]) 03/11/2014   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   RLS (restless legs syndrome) 07/04/2012   History of cerebral aneurysm 03/26/2009   Vitamin D deficiency 08/10/2008   Venous insufficiency 08/10/2008   Disorder of bone and cartilage 12/18/2007   History of acoustic neuroma 08/18/2007   Hypothyroidism 08/18/2007   Tobacco use disorder, continuous 08/18/2007   Mixed hyperlipidemia 07/18/2007   Anxiety with depression  07/18/2007   Mitral valve disorder 07/18/2007   COPD (chronic obstructive pulmonary disease) with chronic bronchitis ([place]) 07/18/2007   Fibromyalgia 07/18/2007   Past Medical History:  Diagnosis Date   AN (acoustic neuroma) ([place])    Aneurysm ([place])    Anxiety    Cataract    Cerebrovascular disease    Cigarette smoker    COPD (chronic obstructive pulmonary disease) (HCC)    mild, no inhalers or oxygen used   Coronary artery disease    no current cardiologist  pt. denies   Deafness in right ear    Depression    Headache    tension headaches due to aneurysm repair with stent   Heart murmur    History of kidney stones    15 to 20 yrs ago   Hyperlipidemia    Hypothyroidism    IBS (irritable bowel syndrome)    Mitral valve prolapse    mild takes beta blocker   Osteopenia    Palpitations    Stroke ([place])    x 3 ministrokes last one feb 2014   Venous insufficiency    Vitamin D deficiency     Family History  Problem Relation Age of Onset   Heart disease Mother    Cancer Mother        Lung   Heart attack Mother    Diabetes Mother    Cancer Father        Lung   Diabetes Sister    Cancer Sister        unkown type   Diabetes Brother    Lung cancer Brother        Lung   AAA (abdominal aortic aneurysm) Neg Hx    Colon cancer Neg Hx    Stomach cancer Neg Hx    Esophageal cancer Neg Hx    Liver cancer Neg Hx    Pancreatic cancer Neg Hx    Rectal cancer Neg Hx     Past Surgical History:  Procedure Laterality Date   ANEURYSM COILING  04/2009   Dr. Estanislado Pandy   APPENDECTOMY     BREAST LUMPECTOMY Right 1986   benign   DIAGNOSTIC LAPAROSCOPY     Proable laparoscopic right colectomy 03-14-18 Dr. Excell Seltzer   EYE SURGERY Bilateral    ioc lens for cataracts   LAPAROSCOPIC RIGHT COLECTOMY Right 03/14/2018   Procedure: LAPAROSCOPIC ASSISTED  RIGHT COLECTOMY;  Surgeon: Excell Seltzer, MD;  Location: [facility];  Service: General;  Laterality: Right;   LAPAROSCOPY N/A 03/14/2018    Procedure: LAPAROSCOPY;  Surgeon: Excell Seltzer, MD;  Location: [facility];  Service: General;  Laterality: N/A;   LEFT  HEART CATHETERIZATION WITH CORONARY ANGIOGRAM N/A 08/05/2014   Procedure: LEFT HEART CATHETERIZATION WITH CORONARY ANGIOGRAM;  Surgeon: Wellington Hampshire, MD;  Location: [facility];  Service: Cardiovascular;  Laterality: N/A;   TONSILLECTOMY     TOTAL ABDOMINAL HYSTERECTOMY  1970   complete   TRANSLABYRINTHINE PROCEDURE  2002   [facility] Dr. Vicie Mutters tumor removal   TRANSURETHRAL RESECTION OF BLADDER TUMOR N/A 02/08/2018   Procedure: TRANSURETHRAL RESECTION OF BLADDER TUMOR (TURBT)/ POSTOPERATIVE INSTILLATION OF CHEMO THERAPY;  Surgeon: Festus Aloe, MD;  Location: [facility];  Service: Urology;  Laterality: N/A;   Social History   Occupational History   Occupation: alterations   Occupation: Retired    Comment: Regulatory affairs officer  Tobacco Use   Smoking status: Every Day    Packs/day: 0.25    Years: 50.00    Pack years: 12.50    Types: Cigarettes   Smokeless tobacco: Never   Tobacco comments:    4-5 cigs daily  is aware she needs to quit  Vaping Use   Vaping Use: Never used  Substance and Sexual Activity   Alcohol use: No    Alcohol/week: 0.0 standard drinks   Drug use: No   Sexual activity: Not Currently

## 2021-02-04 ENCOUNTER — Ambulatory Visit (INDEPENDENT_AMBULATORY_CARE_PROVIDER_SITE_OTHER): Payer: Medicare Other | Admitting: Family Medicine

## 2021-02-04 ENCOUNTER — Encounter: Payer: Self-pay | Admitting: Family Medicine

## 2021-02-04 ENCOUNTER — Other Ambulatory Visit: Payer: Self-pay

## 2021-02-04 VITALS — BP 100/70 | HR 81 | Temp 97.1°F | Ht 62.5 in | Wt 119.6 lb

## 2021-02-04 DIAGNOSIS — E039 Hypothyroidism, unspecified: Secondary | ICD-10-CM | POA: Diagnosis not present

## 2021-02-04 DIAGNOSIS — J449 Chronic obstructive pulmonary disease, unspecified: Secondary | ICD-10-CM | POA: Diagnosis not present

## 2021-02-04 DIAGNOSIS — Z7901 Long term (current) use of anticoagulants: Secondary | ICD-10-CM | POA: Diagnosis not present

## 2021-02-04 DIAGNOSIS — Z01818 Encounter for other preprocedural examination: Secondary | ICD-10-CM

## 2021-02-04 DIAGNOSIS — I10 Essential (primary) hypertension: Secondary | ICD-10-CM | POA: Diagnosis not present

## 2021-02-04 DIAGNOSIS — F17209 Nicotine dependence, unspecified, with unspecified nicotine-induced disorders: Secondary | ICD-10-CM | POA: Diagnosis not present

## 2021-02-04 DIAGNOSIS — M5126 Other intervertebral disc displacement, lumbar region: Secondary | ICD-10-CM | POA: Diagnosis not present

## 2021-02-04 NOTE — Patient Instructions (Signed)
Recommend smoking cessation at least 48 hours prior to surgery and through post-operative time. Recommend stopping Plavix 1 day prior to surgery and restarting 1 day after surgery.

## 2021-02-04 NOTE — Progress Notes (Signed)
[facility] [address] [place] [address] Dept: [phone] Dept Fax: [phone]  Office Visit  Subjective:    Patient ID: Ana Santos, female    DOB: 06/20/1940, 80 y.o..   MRN: [id number]  Chief Complaint  Patient presents with   Follow-up    Pre-op f/u     History of Present Illness:  Patient is in today for presurgical clearance at the request of Dr. Lorin Mercy (orthopedics). He has recommended an L1-L2 discectomy due to herniated disc.This was a result of a fall she had in Aug. 2021.  Ms. Goldsberry has a history of hypertension, managed with amlodipine. She also has hyperlipidemia, which is managed with pravastatin.   Ms. Turnbaugh has a history of hypothyroidism, managed on Synthroid.   Ms. Laack has a history of a prior aneurysm of a basilar artery. She underwent a procedure to place coils in this, but then had a coil cause a blockage of the right anterior inferior cerebellar artery.  She now has chronic "ice-pick" headaches and has had some imbalance issues.   Ms. Plager has a history of COPD. She only occasionally use an albuterol inhaler for this. She continues to smoke 1/4 ppd of cigarettes.  Past Medical History: Patient Active Problem List   Diagnosis Date Noted   Herniated intervertebral disc of lumbar spine 01/26/2021   Foraminal stenosis due to intervertebral disc disease 01/21/2021   History of bladder cancer 01/21/2021   Labyrinthitis 12/08/2019   Left-sided low back pain without sciatica 05/05/2019   Visual loss, transient, bilateral 02/21/2019   Essential hypertension 02/21/2019   Colonic mass s/p lap ileocectomy 03/14/2018 03/14/2018   Acute lower GI bleeding 03/14/2018   Chronic anticoagulation 03/14/2018   Chronic tension-type headache, not intractable 05/31/2015   Symptomatic PVCs 08/04/2014   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 ([place]) 03/11/2014    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   RLS (restless legs syndrome) 07/04/2012   History of cerebral aneurysm 03/26/2009   Vitamin D deficiency 08/10/2008   Venous insufficiency 08/10/2008   Disorder of bone and cartilage 12/18/2007   History of acoustic neuroma 08/18/2007   Hypothyroidism 08/18/2007   Tobacco use disorder, continuous 08/18/2007   Mixed hyperlipidemia 07/18/2007   Anxiety with depression 07/18/2007   Mitral valve disorder 07/18/2007   COPD (chronic obstructive pulmonary disease) with chronic bronchitis ([place]) 07/18/2007   Fibromyalgia 07/18/2007   Past Surgical History:  Procedure Laterality Date   ANEURYSM COILING  04/2009   Dr. Estanislado Pandy   APPENDECTOMY     BREAST LUMPECTOMY Right 1986   benign   DIAGNOSTIC LAPAROSCOPY     Proable laparoscopic right colectomy 03-14-18 Dr. Excell Seltzer   EYE SURGERY Bilateral    ioc lens for cataracts   LAPAROSCOPIC RIGHT COLECTOMY Right 03/14/2018   Procedure: LAPAROSCOPIC ASSISTED  RIGHT COLECTOMY;  Surgeon: Excell Seltzer, MD;  Location: [facility];  Service: General;  Laterality: Right;   LAPAROSCOPY N/A 03/14/2018   Procedure: LAPAROSCOPY;  Surgeon: Excell Seltzer, MD;  Location: [facility];  Service: General;  Laterality: N/A;   LEFT HEART CATHETERIZATION WITH CORONARY ANGIOGRAM N/A 08/05/2014   Procedure: LEFT HEART CATHETERIZATION WITH CORONARY ANGIOGRAM;  Surgeon: Wellington Hampshire, MD;  Location: [facility];  Service: Cardiovascular;  Laterality: N/A;   TONSILLECTOMY     TOTAL ABDOMINAL HYSTERECTOMY  1970   complete   TRANSLABYRINTHINE PROCEDURE  2002   [facility] Dr. Vicie Mutters tumor removal   TRANSURETHRAL RESECTION OF  BLADDER TUMOR N/A 02/08/2018   Procedure: TRANSURETHRAL RESECTION OF BLADDER TUMOR (TURBT)/ POSTOPERATIVE INSTILLATION OF CHEMO THERAPY;  Surgeon: Festus Aloe, MD;  Location: [facility];  Service: Urology;  Laterality: N/A;   Family History  Problem Relation Age of Onset   Heart disease  Mother    Cancer Mother        Lung   Heart attack Mother    Diabetes Mother    Cancer Father        Lung   Diabetes Sister    Cancer Sister        unkown type   Diabetes Brother    Lung cancer Brother        Lung   AAA (abdominal aortic aneurysm) Neg Hx    Colon cancer Neg Hx    Stomach cancer Neg Hx    Esophageal cancer Neg Hx    Liver cancer Neg Hx    Pancreatic cancer Neg Hx    Rectal cancer Neg Hx    Outpatient Medications Prior to Visit  Medication Sig Dispense Refill   acetaminophen (TYLENOL) 500 MG tablet Take 500 mg by mouth every 6 (six) hours as needed for mild pain.     albuterol (VENTOLIN HFA) 108 (90 Base) MCG/ACT inhaler      amLODipine (NORVASC) 5 MG tablet TAKE 1 TABLET DAILY 90 tablet 0   clopidogrel (PLAVIX) 75 MG tablet Take 1 tablet (75 mg total) by mouth daily. 90 tablet 3   gabapentin (NEURONTIN) 100 MG capsule Take one at night times 1 week then 2 po q HS 60 capsule 2   hydroxypropyl methylcellulose / hypromellose (ISOPTO TEARS / GONIOVISC) 2.5 % ophthalmic solution Place 1 drop into both eyes 2 (two) times daily.     levothyroxine (SYNTHROID) 88 MCG tablet Take 1 tablet (88 mcg total) by mouth daily. 90 tablet 0   ondansetron (ZOFRAN) 4 MG tablet Take 1 tablet (4 mg total) by mouth every 8 (eight) hours as needed for nausea or vomiting. 20 tablet 0   pramipexole (MIRAPEX) 0.5 MG tablet Take one nightly one hour prior to going to bed as needed for restless leg syndrome. 90 tablet 4   pravastatin (PRAVACHOL) 40 MG tablet TAKE 1 TABLET DAILY 90 tablet 3   sertraline (ZOLOFT) 25 MG tablet Take 1 tablet (25 mg total) by mouth daily. 30 tablet 5   No facility-administered medications prior to visit.   Allergies  Allergen Reactions   Prednisone Other (See Comments)    Hallucinations   Atorvastatin Other (See Comments)     Lipitor caused arm pain (3/09)   Hydrocodone-Acetaminophen Other (See Comments)    hallucinations   Morphine Other (See Comments)     hallucinations   Nortriptyline     Caused nausea and vomiting and shaking, kept her up all night   Topamax [Topiramate] Nausea And Vomiting    *dizziness*   Tramadol     Objective:   Today's Vitals   02/04/21 0828  BP: 100/70  Pulse: 81  Temp: (!) 97.1 F (36.2 C)  TempSrc: Temporal  SpO2: 99%  Weight: 119 lb 9.6 oz (54.3 kg)  Height: 5' 2.5" (1.588 m)   Body mass index is 21.53 kg/m.   General: Well developed, well nourished. No acute distress. HEENT: Normocephalic, non-traumatic. PERRL, EOMI. Conjunctiva clear. External ears normal. EAC   and TMs normal bilaterally. Nose clear without congestion or rhinorrhea. Mucous membranes moist.   Oropharynx clear. Edentulous with both upper and  lower dentures. Neck: Supple. No lymphadenopathy. No thyromegaly. Lungs: Clear to auscultation bilaterally. No wheezing, rales or rhonchi. CV: RRR without murmurs or rubs. Pulses 2+ bilaterally. Abdomen: Soft, non-tender. Bowel sounds positive, normal pitch and frequency. No   hepatosplenomegaly. No rebound or guarding. Back: Straight. No CVA tenderness bilaterally. Extremities: Full ROM. No joint swelling or tenderness. No edema noted. Skin: Warm and dry. No rashes. Psych: Alert and oriented. Normal mood and affect.  Health Maintenance Due  Topic Date Due   TETANUS/TDAP  Never done   Zoster Vaccines- Shingrix (1 of 2) Never done   INFLUENZA VACCINE  01/10/2021   Lab Results Lab Results  Component Value Date   CHOL 170 01/21/2021   HDL 55.30 01/21/2021   LDLCALC 78 01/21/2021   LDLDIRECT 88.0 11/19/2009   TRIG 181.0 (H) 01/21/2021   CHOLHDL 3 01/21/2021   Lab Results  Component Value Date   TSH 6.20 (H) 01/21/2021   Lab Results  Component Value Date   WBC 11.4 (H) 01/21/2021   HGB 12.4 01/21/2021   HCT 39.0 01/21/2021   MCV 90.2 01/21/2021   PLT 342.0 01/21/2021   CMP Latest Ref Rng & Units 01/21/2021 11/29/2020 10/29/2019  Glucose 70 - 99 mg/dL 80 81 89  BUN 6 - 23 mg/dL  '14 17 16  '$ Creatinine 0.40 - 1.20 mg/dL 0.99 1.00 0.71  Sodium 135 - 145 mEq/L 141 141 139  Potassium 3.5 - 5.1 mEq/L 5.0 4.2 4.2  Chloride 96 - 112 mEq/L 107 108 105  CO2 19 - 32 mEq/L '25 24 26  '$ Calcium 8.4 - 10.5 mg/dL 9.7 9.6 9.0  Total Protein 6.0 - 8.3 g/dL 7.0 7.3 -  Total Bilirubin 0.2 - 1.2 mg/dL 0.4 0.4 -  Alkaline Phos 39 - 117 U/L 111 91 -  AST 0 - 37 U/L 13 15 -  ALT 0 - 35 U/L 6 10 -   EKG: Sinus bradycardia (rate= 58). Low-voltage.    Imaging: MRI Lumbar Spine (10/23/2020) IMPRESSION: 1. Multilevel degenerative changes of the lumbar spine, as described above, slightly progressed at L4-L5 with moderate right and mild left foraminal stenosis and mild bilateral subarticular recess stenosis. 2. New small left paracentral disc protrusion at L1-L2. 3. Similar degree of mild right foraminal stenosis at L5-S1 and mild left foraminal stenosis at L3-L4. 4. No significant canal stenosis at any level.  Assessment & Plan:   1. Preoperative examination Ms. Moret appears to be at reasonable surgical risk. I will provide medical clearance for Dr. Lorin Mercy to proceed.  - EKG 12-Lead  2. Lumbar herniated disc Dr. Lorin Mercy plans to proceed with surgical decompression.  3. Essential hypertension Blood pressure at goal. Continue amlodipine  4. Chronic anticoagulation Patient on Plavix s/p cerebral aneurysm coil placement. I recommend she stop her Plavix 1 day priro to surgery and resume 1 day after surgery.  5. Acquired hypothyroidism Mild elevation of TSH recently. Her Synthroid dose was increased in June. She should continue this for now and we will reassess in 1-2 months.  6. COPD (chronic obstructive pulmonary disease) with chronic bronchitis (HCC) Mild. Stable without need for inhalers currently.  7. Tobacco use disorder, continuous I advised Ms. Kulikowski to stop smoking at least 50 hors prior to surgery and try to maintain abstinence through her post-operative  period.  Haydee Salter, MD

## 2021-02-08 ENCOUNTER — Telehealth: Payer: Self-pay

## 2021-02-08 NOTE — Telephone Encounter (Signed)
Denise with [employer] at [employer] called on behalf of Dr. Gena Fray wanting to know if the letter that's in the patient's chart is okay for clearance or does a clearance form need to be completed.  Cb# [phone].  Please advise.  Thank you.

## 2021-02-09 ENCOUNTER — Telehealth: Payer: Self-pay

## 2021-02-09 DIAGNOSIS — I10 Essential (primary) hypertension: Secondary | ICD-10-CM

## 2021-02-09 MED ORDER — AMLODIPINE BESYLATE 5 MG PO TABS: 5.0000 mg | ORAL_TABLET | Freq: Every day | ORAL | 1 refills | Status: DC

## 2021-02-09 NOTE — Telephone Encounter (Signed)
Requesting refill for Amlodipine to be sent to [facility]  Thank you

## 2021-02-09 NOTE — Telephone Encounter (Signed)
Patient notified VIA phone. Dm/cma  

## 2021-02-10 DIAGNOSIS — M84375A Stress fracture, left foot, initial encounter for fracture: Secondary | ICD-10-CM | POA: Diagnosis not present

## 2021-02-11 NOTE — Telephone Encounter (Signed)
I called Langley Gauss and advised letter is fine.

## 2021-02-21 ENCOUNTER — Other Ambulatory Visit: Payer: Self-pay

## 2021-02-23 ENCOUNTER — Other Ambulatory Visit: Payer: Self-pay

## 2021-02-23 ENCOUNTER — Ambulatory Visit (INDEPENDENT_AMBULATORY_CARE_PROVIDER_SITE_OTHER): Payer: Medicare Other | Admitting: Surgery

## 2021-02-23 ENCOUNTER — Encounter: Payer: Self-pay | Admitting: Surgery

## 2021-02-23 VITALS — BP 154/60 | HR 56 | Ht 62.5 in | Wt 120.0 lb

## 2021-02-23 DIAGNOSIS — M5126 Other intervertebral disc displacement, lumbar region: Secondary | ICD-10-CM

## 2021-02-23 NOTE — Progress Notes (Signed)
Surgical Instructions   Your procedure is scheduled on Monday 02/28/2021.  Report to [facility] Main Entrance "A" at 10:30 A.M., then check in with the Admitting office.  Call [phone] if you have problems or questions between now and the morning of surgery:   Remember: Do not eat after midnight the night before your surgery  You may drink clear liquids until 09:30am the morning of your surgery.   Clear liquids allowed are: Water, Non-Citrus Juices (without pulp), Carbonated Beverages, Clear Tea, Black Coffee Only (NO MILK, CREAM, or POWDERED CREAMER of any kind), and Gatorade   Take these medicines the morning of surgery with A SIP OF WATER:     If needed you may take these medications the morning of surgery: Acetaminophen (Tylenol) Amlodipine (Norvasc) Levothyroxine (Synthroid) Pravastatin (Pravachol) Sertraline (Zoloft)   As of today, STOP taking any Aspirin (unless otherwise instructed by your surgeon) or Aspirin-containing products; NSAIDS - Aleve, Naproxen, Ibuprofen, Motrin, Advil, Goody's, BC's, all herbal medications, fish oil, and all vitamins.  Follow your surgeon's instructions on when to stop Clopidogrel (Plavix).  If no instructions were given by your surgeon then you will need to call the office to get those instructions.             Do not wear jewelry or makeup Do not wear lotions, powders, perfumes/colognes, or deodorant. Do not shave 48 hours prior to surgery.  Men may shave face and neck. Do not wear nail polish, gel polish, artificial nails, or any other type of covering on natural nails including fingernails and toenails. If patients have artificial nails, gel coating, etc. that need to be removed by a nail salon please have this removed prior to surgery or surgery may need to be canceled/delayed if the surgeon/ anesthesia feels like the patient is unable to be adequately monitored. Do not bring valuables to the hospital - [facility] is not responsible  for any belongings or valuables.              Do NOT Smoke (Tobacco/Vaping) or drink Alcohol 24 hours prior to your procedure  If you use a CPAP at night, you may bring all equipment for your overnight stay.   Contacts, glasses, hearing aids, dentures or partials may not be worn into surgery, please bring cases for these belongings   For patients admitted to the hospital, discharge time will be determined by your treatment team.   Patients discharged the day of surgery will not be allowed to drive home, and someone needs to stay with them for 24 hours.  ONLY ONE (1) SUPPORT PERSON MAY WAIT IN THE WAITING AREA WHILE YOU ARE IN SURGERY. NO VISITORS WILL BE ALLOWED IN PRE-OP WHERE PATIENTS GET READY FOR SURGERY.  TWO (2) VISITORS WILL BE ALLOWED IN YOUR ROOM IF YOU ARE ADMITTED AFTER SURGERY.  Minor children may have two parents present. Special consideration for safety and communication needs will be reviewed on a case by case basis.  Special instructions:    Oral Hygiene is also important to reduce your risk of infection.  Remember - BRUSH YOUR TEETH THE MORNING OF SURGERY WITH YOUR REGULAR TOOTHPASTE   [place]- Preparing For Surgery  Before surgery, you can play an important role. Because skin is not sterile, your skin needs to be as free of germs as possible. You can reduce the number of germs on your skin by washing with CHG (chlorahexidine gluconate) Soap before surgery.  CHG is an antiseptic cleaner which kills germs  and bonds with the skin to continue killing germs even after washing.     Please do not use if you have an allergy to CHG or antibacterial soaps. If your skin becomes reddened/irritated stop using the CHG.  Do not shave (including legs and underarms) for at least 48 hours prior to first CHG shower. It is OK to shave your face.  Please follow these instructions carefully.     Shower the NIGHT BEFORE SURGERY and the MORNING OF SURGERY with CHG Soap.   If you chose  to wash your hair, wash your hair first as usual with your normal shampoo. After you shampoo, rinse your hair and body thoroughly to remove the shampoo.    Then [employer] and genitals (private parts) with your normal soap and rinse thoroughly to remove soap.  Next use the CHG Soap as you would any other liquid soap. You can apply CHG directly to the skin and wash gently with a clean washcloth.   Apply the CHG Soap to your body ONLY FROM THE NECK DOWN.  Do not use on open wounds or open sores. Avoid contact with your eyes, ears, mouth and genitals (private parts). Wash Face and genitals (private parts)  with your normal soap.   Wash thoroughly, paying special attention to the area where your surgery will be performed.  Thoroughly rinse your body with warm water from the neck down.  DO NOT shower/wash with your normal soap after using and rinsing off the CHG Soap.  Pat yourself dry with a CLEAN TOWEL.  Wear CLEAN PAJAMAS to bed the night before surgery  Place CLEAN SHEETS on your bed the night before your surgery  DO NOT SLEEP WITH PETS.   Day of Surgery:  Take a shower with CHG soap. Wear Clean/Comfortable clothing the morning of surgery Do not apply any deodorants/lotions.   Remember to brush your teeth WITH YOUR REGULAR TOOTHPASTE.   Please read over the following fact sheets that you were given.

## 2021-02-24 DIAGNOSIS — M84375D Stress fracture, left foot, subsequent encounter for fracture with routine healing: Secondary | ICD-10-CM | POA: Diagnosis not present

## 2021-02-25 ENCOUNTER — Other Ambulatory Visit: Payer: Self-pay

## 2021-02-25 ENCOUNTER — Encounter (HOSPITAL_COMMUNITY)
Admission: RE | Admit: 2021-02-25 | Discharge: 2021-02-25 | Disposition: A | Discharge status: Home / Self Care | Payer: Medicare Other | Source: Ambulatory Visit | Attending: Orthopaedic Surgery | Admitting: Orthopaedic Surgery

## 2021-02-25 ENCOUNTER — Encounter (HOSPITAL_COMMUNITY): Payer: Self-pay

## 2021-02-25 DIAGNOSIS — Z20822 Contact with and (suspected) exposure to covid-19: Secondary | ICD-10-CM | POA: Insufficient documentation

## 2021-02-25 DIAGNOSIS — Z01812 Encounter for preprocedural laboratory examination: Secondary | ICD-10-CM | POA: Diagnosis not present

## 2021-02-25 HISTORY — DX: Essential (primary) hypertension: I10

## 2021-02-25 HISTORY — DX: Malignant neoplasm of bladder, unspecified: C67.9

## 2021-02-25 LAB — CBC
HCT: 40.4 % (ref 36.0–46.0)
Hemoglobin: 12.6 g/dL (ref 12.0–15.0)
MCH: 29.6 pg (ref 26.0–34.0)
MCHC: 31.2 g/dL (ref 30.0–36.0)
MCV: 94.8 fL (ref 80.0–100.0)
Platelets: 320 10*3/uL (ref 150–400)
RBC: 4.26 MIL/uL (ref 3.87–5.11)
RDW: 14.3 % (ref 11.5–15.5)
WBC: 14.5 10*3/uL — ABNORMAL HIGH (ref 4.0–10.5)
nRBC: 0 % (ref 0.0–0.2)

## 2021-02-25 LAB — SARS CORONAVIRUS 2 (TAT 6-24 HRS): SARS Coronavirus 2: NEGATIVE

## 2021-02-25 LAB — BASIC METABOLIC PANEL
Anion gap: 8 (ref 5–15)
BUN: 13 mg/dL (ref 8–23)
CO2: 21 mmol/L — ABNORMAL LOW (ref 22–32)
Calcium: 9.3 mg/dL (ref 8.9–10.3)
Chloride: 108 mmol/L (ref 98–111)
Creatinine, Ser: 1.13 mg/dL — ABNORMAL HIGH (ref 0.44–1.00)
GFR, Estimated: 49 mL/min — ABNORMAL LOW (ref >60)
Glucose, Bld: 88 mg/dL (ref 70–99)
Potassium: 3.8 mmol/L (ref 3.5–5.1)
Sodium: 137 mmol/L (ref 135–145)

## 2021-02-25 LAB — SURGICAL PCR SCREEN
MRSA, PCR: NEGATIVE
Staphylococcus aureus: NEGATIVE

## 2021-02-25 NOTE — Progress Notes (Signed)
PCP: Dr. Alvan Dame clearance 02/04/21 EPIC Cardiologist: Denies  EKG: 02/04/21 CXR: n/a ECHO: 08/18/2014 Stress Test: 06/02/98 Cardiac Cath: denies  On Plavix: Instructed by Dr. Gena Fray to stop 1 day prior and 1 day after surgery.  Pt reports "stopping before just to be sure." LD Thurs. 9/15  Instructed to follow MD recommendations.  No orders at PAT: ERAS per anesthesia  Instructed smoking cessation   Covid tested 9/16 @ PAT appt  Patient denies shortness of breath, fever, cough, and chest pain at PAT appointment.  Patient verbalized understanding of instructions provided today at the PAT appointment.  Patient asked to review instructions at home and day of surgery.

## 2021-02-25 NOTE — Anesthesia Preprocedure Evaluation (Addendum)
Anesthesia Evaluation  Patient identified by MRN, date of birth, ID band Patient awake    Reviewed: Allergy & Precautions, NPO status , Patient's Chart, lab work & pertinent test results  Airway Mallampati: II  TM Distance: >3 FB     Dental  (+) Dental Advisory Given   Pulmonary COPD, Current Smoker and Patient abstained from smoking.,    breath sounds clear to auscultation       Cardiovascular hypertension, Pt. on medications + CAD   Rhythm:Regular Rate:Normal     Neuro/Psych  Neuromuscular disease CVA    GI/Hepatic negative GI ROS, Neg liver ROS,   Endo/Other  Hypothyroidism   Renal/GU negative Renal ROS     Musculoskeletal  (+) Fibromyalgia -  Abdominal   Peds  Hematology negative hematology ROS (+)   Anesthesia Other Findings   Reproductive/Obstetrics                            Lab Results  Component Value Date   WBC 14.5 (H) 02/25/2021   HGB 12.6 02/25/2021   HCT 40.4 02/25/2021   MCV 94.8 02/25/2021   PLT 320 02/25/2021   Lab Results  Component Value Date   CREATININE 1.13 (H) 02/25/2021   BUN 13 02/25/2021   NA 137 02/25/2021   K 3.8 02/25/2021   CL 108 02/25/2021   CO2 21 (L) 02/25/2021    Anesthesia Physical Anesthesia Plan  ASA: 3  Anesthesia Plan: General   Post-op Pain Management:    Induction: Intravenous  PONV Risk Score and Plan: 2 and Dexamethasone, Ondansetron and Treatment may vary due to age or medical condition  Airway Management Planned: Oral ETT  Additional Equipment: None  Intra-op Plan:   Post-operative Plan: Extubation in OR  Informed Consent: I have reviewed the patients History and Physical, chart, labs and discussed the procedure including the risks, benefits and alternatives for the proposed anesthesia with the patient or authorized representative who has indicated his/her understanding and acceptance.     Dental advisory  given  Plan Discussed with: CRNA  Anesthesia Plan Comments: (PAT note by Karoline Caldwell, PA-C: Patient was evaluated for chest pain in 2016 and ultimately underwent left heart cath showing minor luminal irregularities with no evidence of obstructive disease and normal LV systolic function.  Her symptoms were felt likely due to frequent PVCs.  Her metoprolol was increased at that time and echo was ordered.  Echo showed EF 65 to 70%, normal wall motion, grade 1 DD, no significant valvular abnormalities.  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  Seen by PCP Dr. Arlester Marker 02/04/2021 and cleared for surgery.  Per note, "Ms. Nembhard appears to be at reasonable surgical risk. I will provide medical clearance for Dr. Lorin Mercy to proceed."  Patient reported last dose Plavix 9/15.  EKG 02/04/21: Sinus  Bradycardia. Rate 58. Low voltage -possible pulmonary disease.   Cartoid [place] 02/28/19: IMPRESSION: Right: Heterogeneous and partially calcified plaque at the right carotid bifurcation contributes to 50%-69% stenosis by established duplex criteria.  Left: Color duplex indicates minimal heterogeneous and calcified plaque, with no hemodynamically significant stenosis by duplex criteria in the left extracranial cerebrovascular circulation.  TTE 08/18/2014: - Left ventricle: The cavity size was normal. Systolic function was  vigorous. The estimated ejection fraction was in the range of 65%  to 70%. Wall motion was normal; there were no regional wall  motion abnormalities. Doppler parameters  are consistent with  abnormal left ventricular relaxation (grade 1 diastolic  dysfunction). There was no evidence of elevated ventricular  filling pressure by Doppler parameters.  - Aortic valve: Trileaflet; normal thickness leaflets. There was no  regurgitation.  - Aortic root: The aortic root was normal in size.  - Mitral valve: Structurally normal valve.   - Left atrium: The atrium was normal in size.  - Right ventricle: The cavity size was normal. Wall thickness was  normal. Systolic function was normal.  - Right atrium: The atrium was normal in size.  - Tricuspid valve: Structurally normal valve. There was trivial  regurgitation.  - Pulmonic valve: There was no regurgitation.  - Pulmonary arteries: Systolic pressure was within the normal  range.  - Inferior vena cava: The vessel was normal in size.  - Pericardium, extracardiac: There was no pericardial effusion.   Impressions:   - Trivial mitral and tricuspid regurgitation, otherwise normal  study.    Cath 08/05/2014: Left ventriculography: Left ventricular systolic function is normal , LVEF is estimated at 55 %, there is nosignificant mitral regurgitation   Final Conclusions:  1. Minor luminal irregularities with no evidence of obstructive disease. 2. Normal LV systolic function with mildly elevated left ventricular end-diastolic pressure.  Recommendations: The patient's symptoms are likely due to frequent PVCs. Increased the dose of metoprolol and obtain an echocardiogram. Holter monitor can be considered to quantify the PVCs if symptoms persist.  )       Anesthesia Quick Evaluation

## 2021-02-25 NOTE — Progress Notes (Signed)
Called surgeon office and left message regarding elevated WBC.  Labs also routed to [employer]

## 2021-02-25 NOTE — Progress Notes (Signed)
Anesthesia Chart Review:  Patient was evaluated for chest pain in 2016 and ultimately underwent left heart cath showing minor luminal irregularities with no evidence of obstructive disease and normal LV systolic function.  Her symptoms were felt likely due to frequent PVCs.  Her metoprolol was increased at that time and echo was ordered.  Echo showed EF 65 to 70%, normal wall motion, grade 1 DD, no significant valvular abnormalities.  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  Seen by PCP Dr. Arlester Marker 02/04/2021 and cleared for surgery.  Per note, "Ms. Sneed appears to be at reasonable surgical risk. I will provide medical clearance for Dr. Lorin Mercy to proceed."  Patient reported last dose Plavix 9/15.  EKG 02/04/21: Sinus  Bradycardia. Rate 58. Low voltage -possible pulmonary disease.   Cartoid [place] 02/28/19: IMPRESSION: Right: Heterogeneous and partially calcified plaque at the right carotid bifurcation contributes to 50%-69% stenosis by established duplex criteria.   Left: Color duplex indicates minimal heterogeneous and calcified plaque, with no hemodynamically significant stenosis by duplex criteria in the left extracranial cerebrovascular circulation.  TTE 08/18/2014: - Left ventricle: The cavity size was normal. Systolic function was    vigorous. The estimated ejection fraction was in the range of 65%    to 70%. Wall motion was normal; there were no regional wall    motion abnormalities. Doppler parameters are consistent with    abnormal left ventricular relaxation (grade 1 diastolic    dysfunction). There was no evidence of elevated ventricular    filling pressure by Doppler parameters.  - Aortic valve: Trileaflet; normal thickness leaflets. There was no    regurgitation.  - Aortic root: The aortic root was normal in size.  - Mitral valve: Structurally normal valve.  - Left atrium: The atrium was normal in size.  - Right ventricle:  The cavity size was normal. Wall thickness was    normal. Systolic function was normal.  - Right atrium: The atrium was normal in size.  - Tricuspid valve: Structurally normal valve. There was trivial    regurgitation.  - Pulmonic valve: There was no regurgitation.  - Pulmonary arteries: Systolic pressure was within the normal    range.  - Inferior vena cava: The vessel was normal in size.  - Pericardium, extracardiac: There was no pericardial effusion.   Impressions:   - Trivial mitral and tricuspid regurgitation, otherwise normal    study.    Cath 08/05/2014: Left ventriculography: Left ventricular systolic function is normal , LVEF is estimated at 55 %, there is no significant mitral regurgitation    Final Conclusions:   1. Minor luminal irregularities with no evidence of obstructive disease. 2. Normal LV systolic function with mildly elevated left ventricular end-diastolic pressure.   Recommendations:  The patient's symptoms are likely due to frequent PVCs. Increased the dose of metoprolol and obtain an echocardiogram. Holter monitor can be considered to quantify the PVCs if symptoms persist.    Wynonia Musty [facility] Short Stay Center/Anesthesiology Phone [phone] 02/25/2021 2:28 PM

## 2021-02-28 ENCOUNTER — Ambulatory Visit (HOSPITAL_COMMUNITY): Payer: Medicare Other

## 2021-02-28 ENCOUNTER — Encounter (HOSPITAL_COMMUNITY)
Admission: RE | Disposition: A | Discharge status: Home / Self Care | Payer: Self-pay | Source: Home / Self Care | Attending: Orthopaedic Surgery

## 2021-02-28 ENCOUNTER — Encounter (HOSPITAL_COMMUNITY): Payer: Self-pay | Admitting: Orthopaedic Surgery

## 2021-02-28 ENCOUNTER — Ambulatory Visit (HOSPITAL_COMMUNITY): Payer: Medicare Other | Admitting: Physician Assistant

## 2021-02-28 ENCOUNTER — Other Ambulatory Visit: Payer: Self-pay

## 2021-02-28 ENCOUNTER — Observation Stay (HOSPITAL_COMMUNITY)
Admission: RE | Admit: 2021-02-28 | Discharge: 2021-03-01 | Disposition: A | Discharge status: Home / Self Care | Payer: Medicare Other | Attending: Orthopaedic Surgery | Admitting: Orthopaedic Surgery

## 2021-02-28 DIAGNOSIS — M5116 Intervertebral disc disorders with radiculopathy, lumbar region: Principal | ICD-10-CM | POA: Insufficient documentation

## 2021-02-28 DIAGNOSIS — E559 Vitamin D deficiency, unspecified: Secondary | ICD-10-CM | POA: Diagnosis not present

## 2021-02-28 DIAGNOSIS — J449 Chronic obstructive pulmonary disease, unspecified: Secondary | ICD-10-CM | POA: Insufficient documentation

## 2021-02-28 DIAGNOSIS — M47817 Spondylosis without myelopathy or radiculopathy, lumbosacral region: Secondary | ICD-10-CM | POA: Diagnosis present

## 2021-02-28 DIAGNOSIS — Z8673 Personal history of transient ischemic attack (TIA), and cerebral infarction without residual deficits: Secondary | ICD-10-CM | POA: Insufficient documentation

## 2021-02-28 DIAGNOSIS — I10 Essential (primary) hypertension: Secondary | ICD-10-CM | POA: Insufficient documentation

## 2021-02-28 DIAGNOSIS — I2510 Atherosclerotic heart disease of native coronary artery without angina pectoris: Secondary | ICD-10-CM | POA: Insufficient documentation

## 2021-02-28 DIAGNOSIS — Z8551 Personal history of malignant neoplasm of bladder: Secondary | ICD-10-CM | POA: Insufficient documentation

## 2021-02-28 DIAGNOSIS — Z419 Encounter for procedure for purposes other than remedying health state, unspecified: Secondary | ICD-10-CM

## 2021-02-28 DIAGNOSIS — E782 Mixed hyperlipidemia: Secondary | ICD-10-CM | POA: Diagnosis not present

## 2021-02-28 DIAGNOSIS — M5126 Other intervertebral disc displacement, lumbar region: Secondary | ICD-10-CM | POA: Diagnosis present

## 2021-02-28 DIAGNOSIS — F17210 Nicotine dependence, cigarettes, uncomplicated: Secondary | ICD-10-CM | POA: Insufficient documentation

## 2021-02-28 DIAGNOSIS — E039 Hypothyroidism, unspecified: Secondary | ICD-10-CM | POA: Insufficient documentation

## 2021-02-28 DIAGNOSIS — Z9889 Other specified postprocedural states: Secondary | ICD-10-CM | POA: Diagnosis not present

## 2021-02-28 HISTORY — PX: BACK SURGERY: SHX140

## 2021-02-28 HISTORY — PX: LUMBAR LAMINECTOMY: SHX95

## 2021-02-28 SURGERY — MICRODISCECTOMY LUMBAR LAMINECTOMY
Anesthesia: General

## 2021-02-28 MED ORDER — PROPOFOL 10 MG/ML IV BOLUS
INTRAVENOUS | Status: AC
  Filled 2021-02-28: qty 20

## 2021-02-28 MED ORDER — PRAMIPEXOLE DIHYDROCHLORIDE 0.25 MG PO TABS
0.5000 mg | ORAL_TABLET | Freq: Every day | ORAL | Status: DC
  Administered 2021-02-28: 0.5 mg via ORAL
  Filled 2021-02-28: qty 2

## 2021-02-28 MED ORDER — SERTRALINE HCL 50 MG PO TABS
25.0000 mg | ORAL_TABLET | Freq: Every day | ORAL | Status: DC
  Administered 2021-02-28: 25 mg via ORAL
  Filled 2021-02-28: qty 1

## 2021-02-28 MED ORDER — SODIUM CHLORIDE 0.9% FLUSH: 3.0000 mL | INTRAVENOUS | Status: DC | PRN

## 2021-02-28 MED ORDER — ACETAMINOPHEN 500 MG PO TABS
1000.0000 mg | ORAL_TABLET | Freq: Once | ORAL | Status: AC
  Administered 2021-02-28: 1000 mg via ORAL
  Filled 2021-02-28: qty 2

## 2021-02-28 MED ORDER — ONDANSETRON HCL 4 MG/2ML IJ SOLN: 4.0000 mg | Freq: Four times a day (QID) | INTRAMUSCULAR | Status: DC | PRN

## 2021-02-28 MED ORDER — SODIUM CHLORIDE 0.9 % IV SOLN: 250.0000 mL | INTRAVENOUS | Status: DC

## 2021-02-28 MED ORDER — SUGAMMADEX SODIUM 200 MG/2ML IV SOLN
INTRAVENOUS | Status: DC | PRN
  Administered 2021-02-28: 107 mg via INTRAVENOUS

## 2021-02-28 MED ORDER — SODIUM CHLORIDE 0.9 % IV SOLN: INTRAVENOUS | Status: DC

## 2021-02-28 MED ORDER — AMLODIPINE BESYLATE 5 MG PO TABS
5.0000 mg | ORAL_TABLET | Freq: Every day | ORAL | Status: DC
  Administered 2021-02-28: 5 mg via ORAL
  Filled 2021-02-28: qty 1

## 2021-02-28 MED ORDER — FENTANYL CITRATE (PF) 250 MCG/5ML IJ SOLN
INTRAMUSCULAR | Status: AC
  Filled 2021-02-28: qty 5

## 2021-02-28 MED ORDER — CEFAZOLIN SODIUM-DEXTROSE 2-4 GM/100ML-% IV SOLN
2.0000 g | INTRAVENOUS | Status: AC
  Administered 2021-02-28: 2 g via INTRAVENOUS
  Filled 2021-02-28: qty 100

## 2021-02-28 MED ORDER — HYPROMELLOSE (GONIOSCOPIC) 2.5 % OP SOLN
1.0000 [drp] | Freq: Every day | OPHTHALMIC | Status: DC
  Filled 2021-02-28: qty 15

## 2021-02-28 MED ORDER — ONDANSETRON HCL 4 MG/2ML IJ SOLN
INTRAMUSCULAR | Status: AC
  Filled 2021-02-28: qty 2

## 2021-02-28 MED ORDER — ROCURONIUM BROMIDE 10 MG/ML (PF) SYRINGE
PREFILLED_SYRINGE | INTRAVENOUS | Status: AC
  Filled 2021-02-28: qty 10

## 2021-02-28 MED ORDER — PRAVASTATIN SODIUM 40 MG PO TABS
40.0000 mg | ORAL_TABLET | Freq: Every day | ORAL | Status: DC
  Administered 2021-02-28: 40 mg via ORAL
  Filled 2021-02-28: qty 1

## 2021-02-28 MED ORDER — VITAMIN D-3 125 MCG (5000 UT) PO TABS: 5000.0000 [IU] | ORAL_TABLET | Freq: Every day | ORAL | Status: DC

## 2021-02-28 MED ORDER — FENTANYL CITRATE (PF) 100 MCG/2ML IJ SOLN
25.0000 ug | INTRAMUSCULAR | Status: DC | PRN
  Administered 2021-02-28 (×2): 50 ug via INTRAVENOUS

## 2021-02-28 MED ORDER — ORAL CARE MOUTH RINSE: 15.0000 mL | Freq: Once | OROMUCOSAL | Status: AC

## 2021-02-28 MED ORDER — CHLORHEXIDINE GLUCONATE 0.12 % MT SOLN
15.0000 mL | Freq: Once | OROMUCOSAL | Status: AC
  Administered 2021-02-28: 15 mL via OROMUCOSAL
  Filled 2021-02-28: qty 15

## 2021-02-28 MED ORDER — ROCURONIUM BROMIDE 10 MG/ML (PF) SYRINGE
PREFILLED_SYRINGE | INTRAVENOUS | Status: DC | PRN
  Administered 2021-02-28: 40 mg via INTRAVENOUS

## 2021-02-28 MED ORDER — PHENOL 1.4 % MT LIQD: 1.0000 | OROMUCOSAL | Status: DC | PRN

## 2021-02-28 MED ORDER — MENTHOL 3 MG MT LOZG: 1.0000 | LOZENGE | OROMUCOSAL | Status: DC | PRN

## 2021-02-28 MED ORDER — AMISULPRIDE (ANTIEMETIC) 5 MG/2ML IV SOLN: 10.0000 mg | Freq: Once | INTRAVENOUS | Status: DC | PRN

## 2021-02-28 MED ORDER — PHENYLEPHRINE HCL-NACL 20-0.9 MG/250ML-% IV SOLN
INTRAVENOUS | Status: DC | PRN
  Administered 2021-02-28: 50 ug/min via INTRAVENOUS

## 2021-02-28 MED ORDER — FENTANYL CITRATE (PF) 100 MCG/2ML IJ SOLN
INTRAMUSCULAR | Status: AC
  Filled 2021-02-28: qty 2

## 2021-02-28 MED ORDER — OXYCODONE HCL 5 MG PO TABS
5.0000 mg | ORAL_TABLET | Freq: Four times a day (QID) | ORAL | Status: DC | PRN
  Administered 2021-02-28 – 2021-03-01 (×3): 5 mg via ORAL
  Filled 2021-02-28 (×4): qty 1

## 2021-02-28 MED ORDER — CLOPIDOGREL BISULFATE 75 MG PO TABS: 75.0000 mg | ORAL_TABLET | Freq: Every day | ORAL | Status: DC

## 2021-02-28 MED ORDER — BUPIVACAINE HCL (PF) 0.25 % IJ SOLN
INTRAMUSCULAR | Status: AC
  Filled 2021-02-28: qty 30

## 2021-02-28 MED ORDER — LIDOCAINE 2% (20 MG/ML) 5 ML SYRINGE
INTRAMUSCULAR | Status: AC
  Filled 2021-02-28: qty 5

## 2021-02-28 MED ORDER — ONDANSETRON HCL 4 MG/2ML IJ SOLN
INTRAMUSCULAR | Status: DC | PRN
  Administered 2021-02-28: 4 mg via INTRAVENOUS

## 2021-02-28 MED ORDER — GABAPENTIN 100 MG PO CAPS
200.0000 mg | ORAL_CAPSULE | Freq: Every day | ORAL | Status: DC
  Administered 2021-02-28: 200 mg via ORAL
  Filled 2021-02-28: qty 2

## 2021-02-28 MED ORDER — BUPIVACAINE HCL (PF) 0.25 % IJ SOLN
INTRAMUSCULAR | Status: DC | PRN
  Administered 2021-02-28: 10 mL

## 2021-02-28 MED ORDER — HEMOSTATIC AGENTS (NO CHARGE) OPTIME
TOPICAL | Status: DC | PRN
  Administered 2021-02-28: 1 via TOPICAL

## 2021-02-28 MED ORDER — FERROUS SULFATE 325 (65 FE) MG PO TABS
325.0000 mg | ORAL_TABLET | Freq: Every day | ORAL | Status: DC
  Administered 2021-03-01: 325 mg via ORAL

## 2021-02-28 MED ORDER — LEVOTHYROXINE SODIUM 88 MCG PO TABS
88.0000 ug | ORAL_TABLET | Freq: Every day | ORAL | Status: DC
  Administered 2021-03-01: 88 ug via ORAL
  Filled 2021-02-28: qty 1

## 2021-02-28 MED ORDER — PROPOFOL 10 MG/ML IV BOLUS
INTRAVENOUS | Status: DC | PRN
  Administered 2021-02-28: 100 mg via INTRAVENOUS

## 2021-02-28 MED ORDER — DEXAMETHASONE SODIUM PHOSPHATE 10 MG/ML IJ SOLN
INTRAMUSCULAR | Status: DC | PRN
  Administered 2021-02-28: 10 mg via INTRAVENOUS

## 2021-02-28 MED ORDER — DEXAMETHASONE SODIUM PHOSPHATE 10 MG/ML IJ SOLN
INTRAMUSCULAR | Status: AC
  Filled 2021-02-28: qty 1

## 2021-02-28 MED ORDER — POLYVINYL ALCOHOL 1.4 % OP SOLN
1.0000 [drp] | Freq: Every day | OPHTHALMIC | Status: DC
  Filled 2021-02-28: qty 15

## 2021-02-28 MED ORDER — SODIUM CHLORIDE 0.9% FLUSH: 3.0000 mL | Freq: Two times a day (BID) | INTRAVENOUS | Status: DC

## 2021-02-28 MED ORDER — EPHEDRINE SULFATE-NACL 50-0.9 MG/10ML-% IV SOSY
PREFILLED_SYRINGE | INTRAVENOUS | Status: DC | PRN
  Administered 2021-02-28 (×2): 10 mg via INTRAVENOUS

## 2021-02-28 MED ORDER — 0.9 % SODIUM CHLORIDE (POUR BTL) OPTIME
TOPICAL | Status: DC | PRN
  Administered 2021-02-28: 1000 mL

## 2021-02-28 MED ORDER — ONDANSETRON HCL 4 MG PO TABS: 4.0000 mg | ORAL_TABLET | Freq: Four times a day (QID) | ORAL | Status: DC | PRN

## 2021-02-28 MED ORDER — FENTANYL CITRATE (PF) 250 MCG/5ML IJ SOLN
INTRAMUSCULAR | Status: DC | PRN
  Administered 2021-02-28 (×3): 50 ug via INTRAVENOUS

## 2021-02-28 MED ORDER — LIDOCAINE 2% (20 MG/ML) 5 ML SYRINGE
INTRAMUSCULAR | Status: DC | PRN
  Administered 2021-02-28: 60 mg via INTRAVENOUS

## 2021-02-28 MED ORDER — LACTATED RINGERS IV SOLN: INTRAVENOUS | Status: DC

## 2021-02-28 MED ORDER — DOCUSATE SODIUM 100 MG PO CAPS
100.0000 mg | ORAL_CAPSULE | Freq: Two times a day (BID) | ORAL | Status: DC
  Administered 2021-02-28: 100 mg via ORAL
  Filled 2021-02-28: qty 1

## 2021-02-28 SURGICAL SUPPLY — 41 items
BAG COUNTER SPONGE SURGICOUNT (BAG) ×2 IMPLANT
BUR ROUND FLUTED 4 SOFT TCH (BURR) ×2 IMPLANT
CANISTER SUCT 3000ML PPV (MISCELLANEOUS) ×2 IMPLANT
COVER SURGICAL LIGHT HANDLE (MISCELLANEOUS) ×2 IMPLANT
DERMABOND ADVANCED (GAUZE/BANDAGES/DRESSINGS)
DERMABOND ADVANCED .7 DNX12 (GAUZE/BANDAGES/DRESSINGS) IMPLANT
DRAPE MICROSCOPE LEICA (MISCELLANEOUS) ×2 IMPLANT
DRSG MEPILEX BORDER 4X4 (GAUZE/BANDAGES/DRESSINGS) IMPLANT
DRSG MEPILEX BORDER 4X8 (GAUZE/BANDAGES/DRESSINGS) ×2 IMPLANT
DURAPREP 26ML APPLICATOR (WOUND CARE) ×2 IMPLANT
DURASEAL SPINE SEALANT 3ML (MISCELLANEOUS) IMPLANT
ELECT REM PT RETURN 9FT ADLT (ELECTROSURGICAL) ×2
ELECTRODE REM PT RTRN 9FT ADLT (ELECTROSURGICAL) ×1 IMPLANT
GAUZE SPONGE 4X4 12PLY STRL (GAUZE/BANDAGES/DRESSINGS) IMPLANT
GLOVE SRG 8 PF TXTR STRL LF DI (GLOVE) ×2 IMPLANT
GLOVE SURG ORTHO LTX SZ7.5 (GLOVE) ×4 IMPLANT
GLOVE SURG UNDER POLY LF SZ8 (GLOVE) ×4
GOWN STRL REUS W/ TWL LRG LVL3 (GOWN DISPOSABLE) ×1 IMPLANT
GOWN STRL REUS W/ TWL XL LVL3 (GOWN DISPOSABLE) ×1 IMPLANT
GOWN STRL REUS W/TWL 2XL LVL3 (GOWN DISPOSABLE) ×2 IMPLANT
GOWN STRL REUS W/TWL LRG LVL3 (GOWN DISPOSABLE) ×2
GOWN STRL REUS W/TWL XL LVL3 (GOWN DISPOSABLE) ×2
KIT BASIN OR (CUSTOM PROCEDURE TRAY) ×2 IMPLANT
KIT TURNOVER KIT B (KITS) ×2 IMPLANT
NEEDLE SPNL 18GX3.5 QUINCKE PK (NEEDLE) ×2 IMPLANT
NS IRRIG 1000ML POUR BTL (IV SOLUTION) ×2 IMPLANT
PACK LAMINECTOMY ORTHO (CUSTOM PROCEDURE TRAY) ×2 IMPLANT
PAD ARMBOARD 7.5X6 YLW CONV (MISCELLANEOUS) ×4 IMPLANT
PATTIES SURGICAL .5 X.5 (GAUZE/BANDAGES/DRESSINGS) ×2 IMPLANT
PATTIES SURGICAL .75X.75 (GAUZE/BANDAGES/DRESSINGS) ×2 IMPLANT
STAPLER VISISTAT 35W (STAPLE) ×2 IMPLANT
SURGIFLO W/THROMBIN 8M KIT (HEMOSTASIS) ×2 IMPLANT
SUT VIC AB 1 CTX 36 (SUTURE) ×2
SUT VIC AB 1 CTX36XBRD ANBCTR (SUTURE) ×1 IMPLANT
SUT VIC AB 2-0 CT1 27 (SUTURE) ×2
SUT VIC AB 2-0 CT1 TAPERPNT 27 (SUTURE) ×1 IMPLANT
SUT VIC AB 3-0 X1 27 (SUTURE) ×2 IMPLANT
SYR 20ML ECCENTRIC (SYRINGE) IMPLANT
TOWEL GREEN STERILE (TOWEL DISPOSABLE) ×2 IMPLANT
TOWEL GREEN STERILE FF (TOWEL DISPOSABLE) ×2 IMPLANT
WATER STERILE IRR 1000ML POUR (IV SOLUTION) ×2 IMPLANT

## 2021-02-28 NOTE — Anesthesia Procedure Notes (Signed)
Procedure Name: Intubation Date/Time: 02/28/2021 12:35 PM Performed by: Lance Coon, CRNA Pre-anesthesia Checklist: Patient identified, Emergency Drugs available, Suction available, Patient being monitored and Timeout performed Patient Re-evaluated:Patient Re-evaluated prior to induction Oxygen Delivery Method: Circle system utilized Preoxygenation: Pre-oxygenation with 100% oxygen Induction Type: IV induction Ventilation: Mask ventilation without difficulty Laryngoscope Size: Miller and 2 Grade View: Grade I Tube type: Oral Tube size: 7.5 mm Number of attempts: 1 Airway Equipment and Method: Stylet Placement Confirmation: ETT inserted through vocal cords under direct vision, positive ETCO2 and breath sounds checked- equal and bilateral Secured at: 21 cm Tube secured with: Tape Dental Injury: Teeth and Oropharynx as per pre-operative assessment

## 2021-02-28 NOTE — Transfer of Care (Signed)
Immediate Anesthesia Transfer of Care Note  Patient: Ana Santos  Procedure(s) Performed: LEFT L1-L2 MICRODISCECTOMY AND FASCIECTOMY  Patient Location: PACU  Anesthesia Type:General  Level of Consciousness: awake, alert , oriented and patient cooperative  Airway & Oxygen Therapy: Patient Spontanous Breathing  Post-op Assessment: Report given to RN and Post -op Vital signs reviewed and stable  Post vital signs: Reviewed and stable  Last Vitals:  Vitals Value Taken Time  BP 100/63 02/28/21 1427  Temp    Pulse 91 02/28/21 1433  Resp 20 02/28/21 1433  SpO2 97 % 02/28/21 1433  Vitals shown include unvalidated device data.  Last Pain:  Vitals:   02/28/21 1018  TempSrc:   PainSc: 8       Patients Stated Pain Goal: 3 ([id number])  Complications: No notable events documented.

## 2021-02-28 NOTE — Anesthesia Postprocedure Evaluation (Signed)
Anesthesia Post Note  Patient: Ana Santos  Procedure(s) Performed: LEFT L1-L2 MICRODISCECTOMY AND FASCIECTOMY     Patient location during evaluation: PACU Anesthesia Type: General Level of consciousness: sedated Pain management: pain level controlled Vital Signs Assessment: post-procedure vital signs reviewed and stable Respiratory status: spontaneous breathing and respiratory function stable Cardiovascular status: stable Postop Assessment: no apparent nausea or vomiting Anesthetic complications: no   No notable events documented.  Last Vitals:  Vitals:   02/28/21 1500 02/28/21 1533  BP: 100/64 (!) 105/55  Pulse: 79 (!) 57  Resp: 15 20  Temp: 36.9 C 36.5 C  SpO2: 92% 100%    Last Pain:  Vitals:   02/28/21 1533  TempSrc: Oral  PainSc:                  Breonna Gafford DANIEL

## 2021-02-28 NOTE — Interval H&P Note (Signed)
History and Physical Interval Note:  02/28/2021 12:09 PM  Ana Santos  has presented today for surgery, with the diagnosis of left L1-L2 herniated nucleus pulposus.  The various methods of treatment have been discussed with the patient and family. After consideration of risks, benefits and other options for treatment, the patient has consented to  Procedure(s): LEFT L1-L2 MICRODISCECTOMY (N/A) as a surgical intervention.  The patient's history has been reviewed, patient examined, no change in status, stable for surgery.  I have reviewed the patient's chart and labs.  Questions were answered to the patient's satisfaction.     Marybelle Killings

## 2021-02-28 NOTE — Plan of Care (Signed)
WNL

## 2021-02-28 NOTE — Op Note (Signed)
Preop diagnosis: Left L1-2 HNP with caudally extruded fragment  Postop diagnosis: Same  Procedure: Left complete hemilaminectomy.  Partial right laminotomy L1.  Left L1-2 microdiscectomy.  Partial facetectomy.  Surgeon: Rodell Perna, MD  Assistant: Benjiman Core, PA-C medically necessary and present for the entire procedure  EBL: Less than 100 cc  Anesthesia: General orotracheal plus Marcaine skin local.  Findings: Extruded fragment caudally on the left side causing lateral recess stenosis.  Wide spinous process required partial central decompression and partial facetectomy on the left at L1-2 for exposure and removal of fragment.  Procedure: After induction general esthesia orotracheal intubation antibiotics timeout procedure with patient prone with calf pumpers yellow-gray.  The ulnar nerve and rolled gently crates underneath the shoulder back was prepped with DuraPrep there is squared with towel sterile skin marker Betadine Steri-Drape laminectomy sheets and drapes.  Spinal needle was placed based on palpable landmarks at the L1-3 level and spinal needle was just immediately below the L1-2 level at the level of the pedicle.  Incision was made on the left subperiosteal dissection down to the lamina.  There was shingling and overhanging of the lamina and a bar bur was used to remove left L1 hemilaminectomy.  Partial facetectomy was performed and even then it was difficult to get the canal open and the wide spinous process despite the use of the operative microscope angled still blocked exposure.  Inferior one half of the spinous process was removed and the McCullough retractor was used with 2 shortest blades for exposure.  In the midline and chunks of ligament was removed dura was exposed.  Removal of bone at the level of the pedicle was performed and the fragment extruded from extremely tightwhich would only allow the tip of a Penfield 4 to enter and would not allow the micropituitary to enter.   Fragment was caudally extruded underneath the ligament it was teased out with micro dissection black nerve hook and micropituitary's and removed.  Epstein was used to clean out remaining portions and a small black nerve root to tease all fragments out decompressing the dura and Barbra Sarks can be passed anterior to the dura without areas of compression.  Foraminal was loose.  Some epidural veins which were prominent were coagulated with bipolar cautery.  Some Surgi-Flo was used in the epidural space and epidural space was dry at the time of closure.  Final spot pictures taken with a Penfield 4 immediately below the disc space at L1-2 exactly where the fragment was located and correspond with the MRI findings.  Final passes were made epidural space was dry and then standard closure with #1 Vicryl in the deep fascia 2 on subtenons tissue skin staple closure postop dressing and transfer the cover him.

## 2021-02-28 NOTE — H&P (Addendum)
Ana Santos is an 80 y.o. female.   Chief Complaint: back pain and LE radiculopathy HPI: patient with hx of L1-2 HNP presented for preop history and physical.  Progressively worsening symptoms.  Failed conservative treatment.  Has been given medical clearance and PCP recommended stopping Plavix one day preop and resuming one day after surgery.    Past Medical History:  Diagnosis Date   AN (acoustic neuroma) (HCC)    Aneurysm (HCC)    Anxiety    Bladder cancer (HCC)    Cataract    Cerebrovascular disease    Cigarette smoker    COPD (chronic obstructive pulmonary disease) (HCC)    mild, no inhalers or oxygen used   Coronary artery disease    no current cardiologist  pt. denies   Deafness in right ear    Depression    Headache    tension headaches due to aneurysm repair with stent   Heart murmur    History of kidney stones    15 to 20 yrs ago   Hyperlipidemia    Hypertension    Hypothyroidism    IBS (irritable bowel syndrome)    Mitral valve prolapse    mild takes beta blocker   Osteopenia    Palpitations    Stroke ([place])    x 3 ministrokes last one feb 2014   Venous insufficiency    Vitamin D deficiency     Past Surgical History:  Procedure Laterality Date   ANEURYSM COILING  04/2009   Dr. Estanislado Pandy   APPENDECTOMY     BREAST LUMPECTOMY Right 1986   benign   DIAGNOSTIC LAPAROSCOPY     Proable laparoscopic right colectomy 03-14-18 Dr. Excell Seltzer   EYE SURGERY Bilateral    ioc lens for cataracts   LAPAROSCOPIC RIGHT COLECTOMY Right 03/14/2018   Procedure: LAPAROSCOPIC ASSISTED  RIGHT COLECTOMY;  Surgeon: Excell Seltzer, MD;  Location: [facility];  Service: General;  Laterality: Right;   LAPAROSCOPY N/A 03/14/2018   Procedure: LAPAROSCOPY;  Surgeon: Excell Seltzer, MD;  Location: [facility];  Service: General;  Laterality: N/A;   LEFT HEART CATHETERIZATION WITH CORONARY ANGIOGRAM N/A 08/05/2014   Procedure: LEFT HEART CATHETERIZATION WITH CORONARY ANGIOGRAM;   Surgeon: Wellington Hampshire, MD;  Location: [facility];  Service: Cardiovascular;  Laterality: N/A;   TONSILLECTOMY     TOTAL ABDOMINAL HYSTERECTOMY  1970   complete   TRANSLABYRINTHINE PROCEDURE  2002   [facility] Dr. Vicie Mutters tumor removal   TRANSURETHRAL RESECTION OF BLADDER TUMOR N/A 02/08/2018   Procedure: TRANSURETHRAL RESECTION OF BLADDER TUMOR (TURBT)/ POSTOPERATIVE INSTILLATION OF CHEMO THERAPY;  Surgeon: Festus Aloe, MD;  Location: [facility];  Service: Urology;  Laterality: N/A;    Family History  Problem Relation Age of Onset   Heart disease Mother    Cancer Mother        Lung   Heart attack Mother    Diabetes Mother    Cancer Father        Lung   Diabetes Sister    Cancer Sister        unkown type   Diabetes Brother    Lung cancer Brother        Lung   AAA (abdominal aortic aneurysm) Neg Hx    Colon cancer Neg Hx    Stomach cancer Neg Hx    Esophageal cancer Neg Hx    Liver cancer Neg Hx    Pancreatic cancer Neg Hx    Rectal cancer Neg Hx  Social History:  reports that she has been smoking cigarettes. She has a 12.50 pack-year smoking history. She has never used smokeless tobacco. She reports that she does not drink alcohol and does not use drugs.  Allergies:  Allergies  Allergen Reactions   Prednisone Other (See Comments)    Hallucinations   Atorvastatin Other (See Comments)     Lipitor caused arm pain (3/09)   Hydrocodone-Acetaminophen Other (See Comments)    hallucinations   Morphine Other (See Comments)    hallucinations   Nortriptyline     Caused nausea and vomiting and shaking, kept her up all night   Topamax [Topiramate] Nausea And Vomiting    *dizziness*   Tramadol Other (See Comments)    withdrawal    No medications prior to admission.    No results found for this or any previous visit (from the past 48 hour(s)). No results found.  Review of Systems  Constitutional:  Positive for activity change.  HENT: Negative.     Respiratory: Negative.    Cardiovascular: Negative.   Gastrointestinal: Negative.   Genitourinary: Negative.   Musculoskeletal:  Positive for back pain.  Neurological:  Positive for numbness.   There were no vitals taken for this visit. Physical Exam HENT:     Head: Normocephalic.     Nose: Nose normal.  Eyes:     Extraocular Movements: Extraocular movements intact.  Cardiovascular:     Rate and Rhythm: Regular rhythm.  Pulmonary:     Effort: Pulmonary effort is normal. No respiratory distress.  Abdominal:     General: There is no distension.  Musculoskeletal:        General: Tenderness present.  Neurological:     Mental Status: She is alert and oriented to person, place, and time.     Assessment/Plan L1-2 HNP  We will proceed with L1-2 microdiscectomy as scheduled. Surgical procedure discussed along with possible recovery time all questions answered.    Benjiman Core, PA-C 02/28/2021, 6:42 AM

## 2021-03-01 ENCOUNTER — Encounter (HOSPITAL_COMMUNITY): Payer: Self-pay | Admitting: Orthopaedic Surgery

## 2021-03-01 DIAGNOSIS — Z8551 Personal history of malignant neoplasm of bladder: Secondary | ICD-10-CM | POA: Diagnosis not present

## 2021-03-01 DIAGNOSIS — M5116 Intervertebral disc disorders with radiculopathy, lumbar region: Secondary | ICD-10-CM | POA: Diagnosis not present

## 2021-03-01 DIAGNOSIS — I2510 Atherosclerotic heart disease of native coronary artery without angina pectoris: Secondary | ICD-10-CM | POA: Diagnosis not present

## 2021-03-01 DIAGNOSIS — F17210 Nicotine dependence, cigarettes, uncomplicated: Secondary | ICD-10-CM | POA: Diagnosis not present

## 2021-03-01 DIAGNOSIS — J449 Chronic obstructive pulmonary disease, unspecified: Secondary | ICD-10-CM | POA: Diagnosis not present

## 2021-03-01 DIAGNOSIS — I10 Essential (primary) hypertension: Secondary | ICD-10-CM | POA: Diagnosis not present

## 2021-03-01 MED ORDER — OXYCODONE HCL 5 MG PO TABS: 5.0000 mg | ORAL_TABLET | Freq: Four times a day (QID) | ORAL | 0 refills | Status: DC | PRN

## 2021-03-01 NOTE — Addendum Note (Signed)
Addendum  created 03/01/21 1150 by Duane Boston, MD   [place] recorded in [place], [place] filed

## 2021-03-01 NOTE — Evaluation (Signed)
Physical Therapy Evaluation and Discharge Patient Details Name: Ana Santos MRN: [id number] DOB: 18-Mar-1941 Today's Date: 03/01/2021  History of Present Illness  80 y.o. female presenting for elective L1-L2 microdiscectomy and fasciectomy secondary to HNP. PMHx significant for anxiety/depression, COPD, CAD, deafness in R ear, HLD, HTN, osteopenia and Hx of CVA x3.  Clinical Impression  Patient evaluated by Physical Therapy with no further acute PT needs identified. Pt ambulating 400 feet with no assistive device at a supervision level. Negotiated a half flight of steps with a cane. Education provided regarding use of cane vs walker for unlevel surfaces, spinal precautions, activity recommendations. All education has been completed and the patient has no further questions. No follow-up Physical Therapy or equipment needs. PT is signing off. Thank you for this referral.      Recommendations for follow up therapy are one component of a multi-disciplinary discharge planning process, led by the attending physician.  Recommendations may be updated based on patient status, additional functional criteria and insurance authorization.  Follow Up Recommendations No PT follow up;Supervision for mobility/OOB    Equipment Recommendations  None recommended by PT    Recommendations for Other Services       Precautions / Restrictions Precautions Precautions: Back;Fall Precaution Booklet Issued: Yes (comment) Precaution Comments: Written and verbal education provided on spinal precautions. Other Brace: No brace needed Restrictions Weight Bearing Restrictions: No      Mobility  Bed Mobility Overal bed mobility: Modified Independent             General bed mobility comments: OOB in chair    Transfers Overall transfer level: Modified independent Equipment used: None Transfers: Sit to/from Stand Sit to Stand: Supervision;Min guard         General transfer comment: Min guard to  supervision A for safety without AD.  Ambulation/Gait Ambulation/Gait assistance: Supervision Gait Distance (Feet): 400 Feet Assistive device: None Gait Pattern/deviations: Step-through pattern;Wide base of support Gait velocity: decreased   General Gait Details: Utilizing wider BOS for increased stability, mild lateral sway, supervision for safety for negotiating level surfaces  Stairs Stairs: Yes Stairs assistance: Min guard Stair Management: No rails;With cane Number of Stairs: 10 General stair comments: cues for step by step pattern, min guard for safety  Wheelchair Mobility    Modified Rankin (Stroke Patients Only)       Balance Overall balance assessment: Needs assistance Sitting-balance support: Feet supported Sitting balance-Leahy Scale: Good Sitting balance - Comments: Able to reach outside of BOS in sitting without LOB.   Standing balance support: No upper extremity supported;During functional activity Standing balance-Leahy Scale: Fair Standing balance comment: Patient appears unsteady. Furniture walks. Initially declined use of RW but in agreement with recommendation for use upon return home.                             Pertinent Vitals/Pain Pain Assessment: Faces Pain Score: 4  Faces Pain Scale: Hurts a little bit Pain Location: Incision Pain Descriptors / Indicators: Aching;Sore Pain Intervention(s): Monitored during session    Home Living Family/patient expects to be discharged to:: Private residence Living Arrangements: Spouse/significant other Available Help at Discharge: Family;Available 24 hours/day Type of Home: House Home Access: Stairs to enter Entrance Stairs-Rails: None Entrance Stairs-Number of Steps: 2 Home Layout: One level Home Equipment: Walker - 2 wheels;Cane - single point      Prior Function Level of Independence: Needs assistance   Gait / Transfers Assistance Needed:  Intermittent use of RW vs HHA from husband.  Husband assists with tub/shower transfers.  ADL's / Homemaking Assistance Needed: Able to bathe/dress herself. Husband/family assists with IADLs including cooking/cleaning and meal prep.        Hand Dominance   Dominant Hand: Right    Extremity/Trunk Assessment   Upper Extremity Assessment Upper Extremity Assessment: Overall WFL for tasks assessed    Lower Extremity Assessment Lower Extremity Assessment: RLE deficits/detail;LLE deficits/detail RLE Deficits / Details: Grossly 4+/5 LLE Deficits / Details: Grossly 4+/5    Cervical / Trunk Assessment Cervical / Trunk Assessment: Other exceptions Cervical / Trunk Exceptions: s/p spinal surgery.  Communication   Communication: Other (comment) (Deaf in R ear)  Cognition Arousal/Alertness: Awake/alert Behavior During Therapy: WFL for tasks assessed/performed Overall Cognitive Status: Within Functional Limits for tasks assessed Area of Impairment: Safety/judgement                         Safety/Judgement: Decreased awareness of deficits     General Comments: Patient with decreased knolwdge of current deficits and fall risk. Requires increased encouragement for use of AD.      General Comments General comments (skin integrity, edema, etc.): RN changed dressing at start of treatment session.    Exercises     Assessment/Plan    PT Assessment Patent does not need any further PT services  PT Problem List         PT Treatment Interventions      PT Goals (Current goals can be found in the Care Plan section)  Acute Rehab PT Goals Patient Stated Goal: To return home. PT Goal Formulation: All assessment and education complete, DC therapy    Frequency     Barriers to discharge        Co-evaluation               AM-PAC PT "6 Clicks" Mobility  Outcome Measure Help needed turning from your back to your side while in a flat bed without using bedrails?: None Help needed moving from lying on your back to  sitting on the side of a flat bed without using bedrails?: None Help needed moving to and from a bed to a chair (including a wheelchair)?: None Help needed standing up from a chair using your arms (e.g., wheelchair or bedside chair)?: None Help needed to walk in hospital room?: A Little Help needed climbing 3-5 steps with a railing? : A Little 6 Click Score: 22    End of Session   Activity Tolerance: Patient tolerated treatment well Patient left: in chair;with call bell/phone within reach;with family/visitor present Nurse Communication: Mobility status PT Visit Diagnosis: Unsteadiness on feet (R26.81)    Time: 6811-5726 PT Time Calculation (min) (ACUTE ONLY): 12 min   Charges:   PT Evaluation $PT Eval Low Complexity: [place], PT, DPT Acute Rehabilitation Services Pager [phone] Office [phone]   Deno Etienne 03/01/2021, 9:45 AM

## 2021-03-01 NOTE — Evaluation (Signed)
Occupational Therapy Evaluation Patient Details Name: Ana Santos MRN: [id number] DOB: April 12, 1941 Today's Date: 03/01/2021   History of Present Illness 80 y.o. female presenting for elective L1-L2 microdiscectomy and fasciectomy secondary to HNP. PMHx significant for anxiety/depression, COPD, CAD, deafness in R ear, HLD, HTN, osteopenia and Hx of CVA x3.   Clinical Impression   PTA patient was living with her spouse in a private residence. Patient reports assist from husband vs use of RW for mobility. Husband also provides assist for tub/shower transfers, and IADLs including housekeeping and meal prep. Patient currently requiring Min guard to supervision A grossly for observed ADLs including grooming standing at sink level, UB/LB dressing and short-distance functional mobility. Without AD, patient requires Min guard improving to supervision A with use of RW. OT provided education on spinal precautions, home set-up to maximize safety and independence with self-care tasks, and acquisition/use of AE. Patient expressed verbal understanding but would benefit from continued education. Patient declined follow-up OT services but would benefit from use of RW with mobility in home and community dwellings and use of 3-in-1 in tub/shower for bathing in sitting.        Recommendations for follow up therapy are one component of a multi-disciplinary discharge planning process, led by the attending physician.  Recommendations may be updated based on patient status, additional functional criteria and insurance authorization.   Follow Up Recommendations  No OT follow up;Supervision/Assistance - 24 hour;Other (comment) (Declined follow-up OT services.)    Equipment Recommendations  3 in 1 bedside commode    Recommendations for Other Services       Precautions / Restrictions Precautions Precautions: Back;Fall Precaution Booklet Issued: Yes (comment) Precaution Comments: Written and verbal education  provided on spinal precautions. Demonstrates good carryover during ADLs. Other Brace: No brace needed Restrictions Weight Bearing Restrictions: No      Mobility Bed Mobility Overal bed mobility: Modified Independent             General bed mobility comments: HOB elevated. Good return demo of log rolling technique. Requries increased time +rail.    Transfers Overall transfer level: Needs assistance Equipment used: None Transfers: Sit to/from Stand Sit to Stand: Supervision;Min guard         General transfer comment: Min guard to supervision A for safety without AD.    Balance Overall balance assessment: Needs assistance Sitting-balance support: Feet supported Sitting balance-Leahy Scale: Good Sitting balance - Comments: Able to reach outside of BOS in sitting without LOB.   Standing balance support: No upper extremity supported;During functional activity Standing balance-Leahy Scale: Poor Standing balance comment: Patient appears unsteady. Furniture walks. Initially declined use of RW but in agreement with recommendation for use upon return home.                           ADL either performed or assessed with clinical judgement   ADL Overall ADL's : Needs assistance/impaired Eating/Feeding: Independent   Grooming: Supervision/safety;Standing   Upper Body Bathing: Supervision/ safety;Standing   Lower Body Bathing: Supervison/ safety;Sit to/from stand   Upper Body Dressing : Set up;Sitting   Lower Body Dressing: Min guard;Sit to/from stand Lower Body Dressing Details (indicate cue type and reason): Able to thread BLE clothing in figure-4 position. Min guard for steadying to hike pants over hips. Toilet Transfer: Supervision/safety;Min Fish farm manager Details (indicate cue type and reason): Simulated with transfer to recliner. Initially Min guard without AD progressing to supervision A with use of  RW.                 Vision Baseline  Vision/History: 1 Wears glasses Patient Visual Report: No change from baseline       Perception     Praxis      Pertinent Vitals/Pain Pain Assessment: 0-10 Pain Score: 4  Pain Location: Incision Pain Descriptors / Indicators: Aching;Sore Pain Intervention(s): Limited activity within patient's tolerance;Monitored during session;Repositioned     Hand Dominance Right   Extremity/Trunk Assessment Upper Extremity Assessment Upper Extremity Assessment: Overall WFL for tasks assessed   Lower Extremity Assessment Lower Extremity Assessment: Defer to PT evaluation   Cervical / Trunk Assessment Cervical / Trunk Assessment: Other exceptions Cervical / Trunk Exceptions: s/p spinal surgery.   Communication Communication Communication: Other (comment) (Deaf in R ear)   Cognition Arousal/Alertness: Awake/alert Behavior During Therapy: WFL for tasks assessed/performed Overall Cognitive Status: Impaired/Different from baseline Area of Impairment: Safety/judgement                         Safety/Judgement: Decreased awareness of deficits     General Comments: Patient with decreased knolwdge of current deficits and fall risk. Requires increased encouragement for use of AD.   General Comments  RN changed dressing at start of treatment session.    Exercises     Shoulder Instructions      Home Living Family/patient expects to be discharged to:: Private residence Living Arrangements: Spouse/significant other Available Help at Discharge: Family;Available 24 hours/day Type of Home: House Home Access: Stairs to enter [employer] of Steps: 2         Bathroom Shower/Tub: Teacher, early years/pre: Handicapped height     Home Equipment: Environmental consultant - 2 wheels;Cane - single point          Prior Functioning/Environment Level of Independence: Needs assistance  Gait / Transfers Assistance Needed: Intermittent use of RW vs HHA from husband. Husband  assists with tub/shower transfers. ADL's / Homemaking Assistance Needed: Able to bathe/dress herself. Husband/family assists with IADLs including cooking/cleaning and meal prep.            OT Problem List: Impaired balance (sitting and/or standing);Decreased safety awareness;Decreased knowledge of use of DME or AE      OT Treatment/Interventions: Self-care/ADL training;Therapeutic exercise;Energy conservation;DME and/or AE instruction;Therapeutic activities;Patient/family education;Balance training    OT Goals(Current goals can be found in the care plan section) Acute Rehab OT Goals Patient Stated Goal: To return home. OT Goal Formulation: With patient Time For Goal Achievement: 03/15/21 Potential to Achieve Goals: Good ADL Goals Additional ADL Goal #1: Patient will complete ADLs with Mod I and LRAD with good adherence to back precautions. Additional ADL Goal #2: Patient will recall and demo 3/3 back precautions in prep for safe d/c home.  OT Frequency: Min 2X/week   Barriers to D/C:            Co-evaluation              AM-PAC OT "6 Clicks" Daily Activity     Outcome Measure Help from another person eating meals?: None Help from another person taking care of personal grooming?: A Little Help from another person toileting, which includes using toliet, bedpan, or urinal?: A Little Help from another person bathing (including washing, rinsing, drying)?: A Little Help from another person to put on and taking off regular upper body clothing?: A Little Help from another person to put on and taking off regular  lower body clothing?: A Little 6 Click Score: 19   End of Session Equipment Utilized During Treatment: Surveyor, mining Communication: Mobility status  Activity Tolerance: Patient tolerated treatment well Patient left: in chair;with call bell/phone within reach  OT Visit Diagnosis: Unsteadiness on feet (R26.81)                Time: 7847-8412 OT Time  Calculation (min): 20 min Charges:  OT General Charges $OT Visit: 1 Visit OT Evaluation $OT Eval Low Complexity: 1 Low  Atwell Mcdanel H. OTR/L Supplemental OT, Department of rehab services [phone]  Erie Sica R H. 03/01/2021, 8:14 AM

## 2021-03-01 NOTE — Plan of Care (Signed)
Adequately ready for discharge 

## 2021-03-01 NOTE — Discharge Instructions (Signed)
Walk daily.  Avoid bending twisting, lifting.  You can shower with your dressing on.  If it comes off you can apply a dry gauze dressing and tape.  See Dr. Lorin Mercy in a about 1 week.

## 2021-03-01 NOTE — Progress Notes (Signed)
Patient ID: Ana Santos, female   DOB: 02-14-1941, 80 y.o.   MRN: [id number]   Subjective: 1 Day Post-Op Procedure(s) (LRB): LEFT L1-L2 MICRODISCECTOMY AND FASCIECTOMY (N/A) Patient reports pain as mild.  Sitting in chair ,dressed."I am ready to go home "  Objective: Vital signs in last 24 hours: Temp:  [97.6 F (36.4 C)-98.9 F (37.2 C)] 98.2 F (36.8 C) (09/20 0719) Pulse Rate:  [54-84] 60 (09/20 0719) Resp:  [12-20] 18 (09/20 0719) BP: (100-154)/(50-64) 116/54 (09/20 0719) SpO2:  [92 %-100 %] 98 % (09/20 0719) Weight:  [53.5 kg] 53.5 kg (09/19 1010)  Intake/Output from previous day: 09/19 0701 - 09/20 0700 In: 1020 [P.O.:120; I.V.:700; IV Piggyback:100] Out: 100 [Blood:100] Intake/Output this shift: No intake/output data recorded.  No results for input(s): HGB in the last 72 hours. No results for input(s): WBC, RBC, HCT, PLT in the last 72 hours. No results for input(s): NA, K, CL, CO2, BUN, CREATININE, GLUCOSE, CALCIUM in the last 72 hours. No results for input(s): LABPT, INR in the last 72 hours.  Neurologically intact DG Lumbar Spine 2-3 Views  Result Date: 02/28/2021 CLINICAL DATA:  Localization for left L1-L2 micro discectomy. EXAM: LUMBAR SPINE - 2-3 VIEW COMPARISON:  MRI lumbar spine 10/23/2020. FINDINGS: Three intraoperative cross-table lateral portable views of the lumbar spine. Localizer projects posterior to L2. IMPRESSION: Localization for procedure. Electronically Signed   By: Ronney Asters M.D.   On: 02/28/2021 17:25    Assessment/Plan: 1 Day Post-Op Procedure(s) (LRB): LEFT L1-L2 MICRODISCECTOMY AND FASCIECTOMY (N/A) Plan:  leg pain gone. Discharge home. Office one week.   Marybelle Killings 03/01/2021, 8:03 AM

## 2021-03-09 ENCOUNTER — Other Ambulatory Visit: Payer: Self-pay

## 2021-03-09 ENCOUNTER — Encounter: Payer: Self-pay | Admitting: Orthopaedic Surgery

## 2021-03-09 ENCOUNTER — Ambulatory Visit (INDEPENDENT_AMBULATORY_CARE_PROVIDER_SITE_OTHER): Payer: Medicare Other | Admitting: Orthopaedic Surgery

## 2021-03-09 DIAGNOSIS — Z9889 Other specified postprocedural states: Secondary | ICD-10-CM | POA: Insufficient documentation

## 2021-03-09 MED ORDER — OXYCODONE HCL 5 MG PO TABS: 5.0000 mg | ORAL_TABLET | Freq: Four times a day (QID) | ORAL | 0 refills | Status: DC | PRN

## 2021-03-09 NOTE — Progress Notes (Signed)
Post-Op Visit Note   Patient: Ana Santos           Date of Birth: 1941/01/27           MRN: [id number] Visit Date: 03/09/2021 PCP: Haydee Salter, MD   Assessment & Plan:post Left L1-2 microdiscectomy  Chief Complaint:  Chief Complaint  Patient presents with   Lower Back - Routine Post Op    Left L1-2 microdiscectomy and fasciectomy   Visit Diagnoses:  1. Status post lumbar microdiscectomy     Plan: Post L1-L2 microdiscectomy.  She is having primarily incisional pain Staples are harvested.  She is worked her way off the cane.  I discussed with her she could use the cane if needed.  Recheck her in 4 weeks.  Percocet was not giving her pain relief she could take 2 tablets instead of 1 says she does with this and continue her daily walking.  Follow-Up Instructions: Return in about 4 weeks (around 04/06/2021).   Orders:  No orders of the defined types were placed in this encounter.  Meds ordered this encounter  Medications   oxyCODONE (OXY IR/ROXICODONE) 5 MG immediate release tablet    Sig: Take 1-2 tablets (5-10 mg total) by mouth every 6 (six) hours as needed for moderate pain.    Dispense:  30 tablet    Refill:  0    Post op pain    Imaging: No results found.  PMFS History: Patient Active Problem List   Diagnosis Date Noted   Status post lumbar microdiscectomy 03/09/2021   HNP (herniated nucleus pulposus), lumbar 02/28/2021   Herniated intervertebral disc of lumbar spine 01/26/2021   Foraminal stenosis due to intervertebral disc disease 01/21/2021   History of bladder cancer 01/21/2021   Labyrinthitis 12/08/2019   Left-sided low back pain without sciatica 05/05/2019   Visual loss, transient, bilateral 02/21/2019   Essential hypertension 02/21/2019   Colonic mass s/p lap ileocectomy 03/14/2018 03/14/2018   Acute lower GI bleeding 03/14/2018   Chronic anticoagulation 03/14/2018   Chronic tension-type headache, not intractable 05/31/2015   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 ([place]) 03/11/2014   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   RLS (restless legs syndrome) 07/04/2012   History of cerebral aneurysm 03/26/2009   Vitamin D deficiency 08/10/2008   Venous insufficiency 08/10/2008   Disorder of bone and cartilage 12/18/2007   History of acoustic neuroma 08/18/2007   Hypothyroidism 08/18/2007   Tobacco use disorder, continuous 08/18/2007   Mixed hyperlipidemia 07/18/2007   Anxiety with depression 07/18/2007   COPD (chronic obstructive pulmonary disease) with chronic bronchitis ([place]) 07/18/2007   Fibromyalgia 07/18/2007   Past Medical History:  Diagnosis Date   AN (acoustic neuroma) ([place])    Aneurysm ([place])    Anxiety    Bladder cancer ([place])    Cataract    Cerebrovascular disease    Cigarette smoker    COPD (chronic obstructive pulmonary disease) (HCC)    mild, no inhalers or oxygen used   Coronary artery disease    no current cardiologist  pt. denies   Deafness in right ear    Depression    Headache    tension headaches due to aneurysm repair with stent   Heart murmur    History of kidney stones    15 to 20 yrs ago   Hyperlipidemia    Hypertension    Hypothyroidism    IBS (irritable bowel syndrome)    Mitral valve prolapse  mild takes beta blocker   Osteopenia    Palpitations    Stroke ([place])    x 3 ministrokes last one feb 2014   Venous insufficiency    Vitamin D deficiency     Family History  Problem Relation Age of Onset   Heart disease Mother    Cancer Mother        Lung   Heart attack Mother    Diabetes Mother    Cancer Father        Lung   Diabetes Sister    Cancer Sister        unkown type   Diabetes Brother    Lung cancer Brother        Lung   AAA (abdominal aortic aneurysm) Neg Hx    Colon cancer Neg Hx    Stomach cancer Neg Hx    Esophageal cancer Neg Hx    Liver cancer Neg Hx    Pancreatic cancer Neg Hx    Rectal cancer Neg Hx     Past Surgical  History:  Procedure Laterality Date   ANEURYSM COILING  04/2009   Dr. Estanislado Pandy   APPENDECTOMY     BREAST LUMPECTOMY Right 1986   benign   DIAGNOSTIC LAPAROSCOPY     Proable laparoscopic right colectomy 03-14-18 Dr. Excell Seltzer   EYE SURGERY Bilateral    ioc lens for cataracts   LAPAROSCOPIC RIGHT COLECTOMY Right 03/14/2018   Procedure: LAPAROSCOPIC ASSISTED  RIGHT COLECTOMY;  Surgeon: Excell Seltzer, MD;  Location: [facility];  Service: General;  Laterality: Right;   LAPAROSCOPY N/A 03/14/2018   Procedure: LAPAROSCOPY;  Surgeon: Excell Seltzer, MD;  Location: [facility];  Service: General;  Laterality: N/A;   LEFT HEART CATHETERIZATION WITH CORONARY ANGIOGRAM N/A 08/05/2014   Procedure: LEFT HEART CATHETERIZATION WITH CORONARY ANGIOGRAM;  Surgeon: Wellington Hampshire, MD;  Location: [facility];  Service: Cardiovascular;  Laterality: N/A;   LUMBAR LAMINECTOMY N/A 02/28/2021   Procedure: LEFT L1-L2 MICRODISCECTOMY AND FASCIECTOMY;  Surgeon: Marybelle Killings, MD;  Location: [facility];  Service: Orthopedics;  Laterality: N/A;   TONSILLECTOMY     TOTAL ABDOMINAL HYSTERECTOMY  1970   complete   TRANSLABYRINTHINE PROCEDURE  2002   [facility] Dr. Vicie Mutters tumor removal   TRANSURETHRAL RESECTION OF BLADDER TUMOR N/A 02/08/2018   Procedure: TRANSURETHRAL RESECTION OF BLADDER TUMOR (TURBT)/ POSTOPERATIVE INSTILLATION OF CHEMO THERAPY;  Surgeon: Festus Aloe, MD;  Location: [facility];  Service: Urology;  Laterality: N/A;   Social History   Occupational History   Occupation: alterations   Occupation: Retired    Comment: Regulatory affairs officer  Tobacco Use   Smoking status: Every Day    Packs/day: 0.25    Years: 50.00    Pack years: 12.50    Types: Cigarettes   Smokeless tobacco: Never   Tobacco comments:    4-5 cigs daily  is aware she needs to quit  Vaping Use   Vaping Use: Never used  Substance and Sexual Activity   Alcohol use: No    Alcohol/week: 0.0 standard drinks   Drug use: No   Sexual  activity: Not Currently

## 2021-03-14 NOTE — Discharge Summary (Signed)
Patient ID: Ana Santos MRN: [id number] DOB/AGE: 80/24/1942 80 y.o.  Admit date: 02/28/2021 Discharge date: 03/01/2021  Admission Diagnoses:  Active Problems:   HNP (herniated nucleus pulposus), lumbar   Discharge Diagnoses:  Active Problems:   HNP (herniated nucleus pulposus), lumbar  status post Procedure(s): LEFT L1-L2 MICRODISCECTOMY AND FASCIECTOMY  Past Medical History:  Diagnosis Date   AN (acoustic neuroma) (HCC)    Aneurysm (HCC)    Anxiety    Bladder cancer (HCC)    Cataract    Cerebrovascular disease    Cigarette smoker    COPD (chronic obstructive pulmonary disease) (HCC)    mild, no inhalers or oxygen used   Coronary artery disease    no current cardiologist  pt. denies   Deafness in right ear    Depression    Headache    tension headaches due to aneurysm repair with stent   Heart murmur    History of kidney stones    15 to 20 yrs ago   Hyperlipidemia    Hypertension    Hypothyroidism    IBS (irritable bowel syndrome)    Mitral valve prolapse    mild takes beta blocker   Osteopenia    Palpitations    Stroke ([place])    x 3 ministrokes last one feb 2014   Venous insufficiency    Vitamin D deficiency     Surgeries: Procedure(s): LEFT L1-L2 MICRODISCECTOMY AND FASCIECTOMY on 02/28/2021   Consultants:   Discharged Condition: Improved  Hospital Course: Ana Santos is an 80 y.o. female who was admitted 02/28/2021 for operative treatment of left L1-2 HNP. Patient failed conservative treatments (please see the history and physical for the specifics) and had severe unremitting pain that affects sleep, daily activities and work/hobbies. After pre-op clearance, the patient was taken to the operating room on 02/28/2021 and underwent  Procedure(s): LEFT L1-L2 MICRODISCECTOMY AND FASCIECTOMY.    Patient was given perioperative antibiotics:  Anti-infectives (From admission, onward)    Start     Dose/Rate Route Frequency Ordered Stop   02/28/21  1015  ceFAZolin (ANCEF) IVPB 2g/100 mL premix        2 g 200 mL/hr over 30 Minutes Intravenous On call to O.R. 02/28/21 1004 02/28/21 1242        Patient was given sequential compression devices and early ambulation to prevent DVT.   Patient benefited maximally from hospital stay and there were no complications. At the time of discharge, the patient was urinating/moving their bowels without difficulty, tolerating a regular diet, pain is controlled with oral pain medications and they have been cleared by PT/OT.   Recent vital signs: No data found.   Recent laboratory studies: No results for input(s): WBC, HGB, HCT, PLT, NA, K, CL, CO2, BUN, CREATININE, GLUCOSE, INR, CALCIUM in the last 72 hours.  Invalid input(s): PT, 2   Discharge Medications:   Allergies as of 03/01/2021       Reactions   Prednisone Other (See Comments)   Hallucinations   Atorvastatin Other (See Comments)    Lipitor caused arm pain (3/09)   Hydrocodone-acetaminophen Other (See Comments)   hallucinations   Morphine Other (See Comments)   hallucinations   Nortriptyline    Caused nausea and vomiting and shaking, kept her up all night   Topamax [topiramate] Nausea And Vomiting   *dizziness*   Tramadol Other (See Comments)   withdrawal        Medication List     TAKE these medications  acetaminophen 500 MG tablet Commonly known as: TYLENOL Take 500 mg by mouth every 6 (six) hours as needed for mild pain.   amLODipine 5 MG tablet Commonly known as: NORVASC Take 1 tablet (5 mg total) by mouth daily.   clopidogrel 75 MG tablet Commonly known as: PLAVIX Take 1 tablet (75 mg total) by mouth daily.   ferrous sulfate 325 (65 FE) MG tablet Take 325 mg by mouth daily with breakfast.   gabapentin 100 MG capsule Commonly known as: NEURONTIN Take one at night times 1 week then 2 po q HS   hydroxypropyl methylcellulose / hypromellose 2.5 % ophthalmic solution Commonly known as: ISOPTO TEARS /  GONIOVISC Place 1 drop into both eyes at bedtime.   levothyroxine 88 MCG tablet Commonly known as: SYNTHROID Take 1 tablet (88 mcg total) by mouth daily.   ondansetron 4 MG tablet Commonly known as: Zofran Take 1 tablet (4 mg total) by mouth every 8 (eight) hours as needed for nausea or vomiting.   pramipexole 0.5 MG tablet Commonly known as: Mirapex Take one nightly one hour prior to going to bed as needed for restless leg syndrome. What changed:  how much to take how to take this when to take this additional instructions   pravastatin 40 MG tablet Commonly known as: PRAVACHOL TAKE 1 TABLET DAILY   sertraline 25 MG tablet Commonly known as: ZOLOFT Take 1 tablet (25 mg total) by mouth daily.   Vitamin D-3 125 MCG (5000 UT) Tabs Take 5,000 Units by mouth daily.        Diagnostic Studies: DG Lumbar Spine 2-3 Views  Result Date: 02/28/2021 CLINICAL DATA:  Localization for left L1-L2 micro discectomy. EXAM: LUMBAR SPINE - 2-3 VIEW COMPARISON:  MRI lumbar spine 10/23/2020. FINDINGS: Three intraoperative cross-table lateral portable views of the lumbar spine. Localizer projects posterior to L2. IMPRESSION: Localization for procedure. Electronically Signed   By: Ronney Asters M.D.   On: 02/28/2021 17:25    Discharge Instructions     Incentive spirometry RT   Complete by: As directed         Follow-up Information     Marybelle Killings, MD. Schedule an appointment as soon as possible for a visit today.   Specialty: Orthopedic Surgery Why: need return office visit one week postop.  call to schedule. Contact information: [address] [place] [address] [phone]                 Discharge Plan:  discharge to home  Disposition:     Signed: Benjiman Core  03/14/2021, 12:53 PM

## 2021-03-18 NOTE — Progress Notes (Signed)
80 year old white female history of left L1-2 HNP comes in for preop evaluation.  Symptoms unchanged from previous visit.  She is wanting to proceed with left L1-2 microdiscectomy as scheduled.  Today history and physical performed.  Review of systems negative.  Patient has been given instructions when to stop her Plavix per her primary care provider.  Surgical procedure discussed along with potential rehab/recovery time.  All questions answered.

## 2021-03-21 ENCOUNTER — Telehealth: Payer: Self-pay | Admitting: Family Medicine

## 2021-03-21 DIAGNOSIS — I6350 Cerebral infarction due to unspecified occlusion or stenosis of unspecified cerebral artery: Secondary | ICD-10-CM

## 2021-03-21 DIAGNOSIS — E039 Hypothyroidism, unspecified: Secondary | ICD-10-CM

## 2021-03-21 MED ORDER — CLOPIDOGREL BISULFATE 75 MG PO TABS: 75.0000 mg | ORAL_TABLET | Freq: Every day | ORAL | 0 refills | Status: DC

## 2021-03-21 MED ORDER — LEVOTHYROXINE SODIUM 88 MCG PO TABS: 88.0000 ug | ORAL_TABLET | Freq: Every day | ORAL | 0 refills | Status: DC

## 2021-03-21 NOTE — Telephone Encounter (Signed)
What is the name of the medication? levothyroxine (SYNTHROID) 88 MCG tablet [[id number]]   Have you contacted your pharmacy to request a refill? Yes, she is needing a refill.   Which pharmacy would you like this sent to? [facility], [place]  [address] [place] [address]  Phone:  [phone]  Fax:  [phone]     Pt is also needing several pills of clopidogrel (PLAVIX) 75 MG tablet [[id number]]. She has been without this for 2 days and will not have it from [facility] for another 3-5days. She would like this one sent to [facility], [place]  [place], [place] [address]  Phone:  [phone]  Fax:  [phone]

## 2021-03-21 NOTE — Telephone Encounter (Signed)
Spoke to patient and confirmed the refills needed and sent them accordingly to the pharmacy. Dm/cma

## 2021-04-05 DIAGNOSIS — Z23 Encounter for immunization: Secondary | ICD-10-CM | POA: Diagnosis not present

## 2021-04-06 ENCOUNTER — Ambulatory Visit (INDEPENDENT_AMBULATORY_CARE_PROVIDER_SITE_OTHER): Payer: Medicare Other | Admitting: Orthopaedic Surgery

## 2021-04-06 ENCOUNTER — Ambulatory Visit: Payer: Self-pay

## 2021-04-06 ENCOUNTER — Encounter: Payer: Self-pay | Admitting: Orthopaedic Surgery

## 2021-04-06 ENCOUNTER — Other Ambulatory Visit: Payer: Self-pay

## 2021-04-06 VITALS — BP 146/75 | HR 83 | Ht 62.0 in | Wt 118.0 lb

## 2021-04-06 DIAGNOSIS — Z9889 Other specified postprocedural states: Secondary | ICD-10-CM

## 2021-04-06 DIAGNOSIS — M5126 Other intervertebral disc displacement, lumbar region: Secondary | ICD-10-CM

## 2021-04-06 MED ORDER — TRAMADOL HCL 50 MG PO TABS: 50.0000 mg | ORAL_TABLET | Freq: Four times a day (QID) | ORAL | 0 refills | Status: DC | PRN

## 2021-04-06 NOTE — Progress Notes (Signed)
Post-Op Visit Note   Patient: Ana Santos           Date of Birth: 03-11-1941           MRN: [id number] Visit Date: 04/06/2021 PCP: Haydee Salter, MD   Assessment & Plan: Post left L1-2 microdiscectomy for cephalad migrated fragment.  She did great for 2 weeks after surgery and then has had recurrence of pain similar to the pain she had before surgery on the left side that radiates into her left groin and no further down her leg.  She is having trouble holding still.  She has been on Neurontin and is off pain medication currently.  She relates an old history of having Ultram withdrawal but actually was only taking 1 tablet twice a day when she went into the hospital with occlusion of the right anterior inferior cerebellar artery she stopped her Tranxene as well and likely had benzodiazepine withdrawal.  Patient is having difficulty cannot sit or stand for period of time.  Her pain is not quite as severe as it was preop.  We will set her up for an MRI scan with and without contrast to rule out recurrent disc herniation.  Plain radiograph showed no instability no evidence that would suggest postop infection.  Her incision is nicely healed.  No weakness on manual motor testing.  She can increase her gabapentin from 2 at night to 3 a day and then after week go to 4-day.  Ultram sent in for pain and she can take 1 every 8 hours as needed in office follow-up after MRI scan.  Chief Complaint:  Chief Complaint  Patient presents with   Lower Back - Routine Post Op   Visit Diagnoses:  1. Status post lumbar microdiscectomy   2. Herniated intervertebral disc of lumbar spine     Plan: Increase gabapentin, MRI scan to rule out recurrent disc herniation.  Ultram sent in for pain follow-up after postop MRI scan.  Follow-Up Instructions: No follow-ups on file.   Orders:  Orders Placed This Encounter  Procedures   XR Lumbar Spine 2-3 Views   MR Lumbar Spine W Wo Contrast   Meds ordered this  encounter  Medications   traMADol (ULTRAM) 50 MG tablet    Sig: Take 1 tablet (50 mg total) by mouth every 6 (six) hours as needed.    Dispense:  30 tablet    Refill:  0    Not allergic to tramadol    Imaging: No results found.  PMFS History: Patient Active Problem List   Diagnosis Date Noted   Status post lumbar microdiscectomy 03/09/2021   HNP (herniated nucleus pulposus), lumbar 02/28/2021   Herniated intervertebral disc of lumbar spine 01/26/2021   Foraminal stenosis due to intervertebral disc disease 01/21/2021   History of bladder cancer 01/21/2021   Labyrinthitis 12/08/2019   Left-sided low back pain without sciatica 05/05/2019   Visual loss, transient, bilateral 02/21/2019   Essential hypertension 02/21/2019   Colonic mass s/p lap ileocectomy 03/14/2018 03/14/2018   Acute lower GI bleeding 03/14/2018   Chronic anticoagulation 03/14/2018   Chronic tension-type headache, not intractable 05/31/2015   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 ([place]) 03/11/2014   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   RLS (restless legs syndrome) 07/04/2012   History of cerebral aneurysm 03/26/2009   Vitamin D deficiency 08/10/2008   Venous insufficiency 08/10/2008   Disorder of bone and cartilage 12/18/2007   History of acoustic neuroma 08/18/2007  Hypothyroidism 08/18/2007   Tobacco use disorder, continuous 08/18/2007   Mixed hyperlipidemia 07/18/2007   Anxiety with depression 07/18/2007   COPD (chronic obstructive pulmonary disease) with chronic bronchitis ([place]) 07/18/2007   Fibromyalgia 07/18/2007   Past Medical History:  Diagnosis Date   AN (acoustic neuroma) (HCC)    Aneurysm (HCC)    Anxiety    Bladder cancer ([place])    Cataract    Cerebrovascular disease    Cigarette smoker    COPD (chronic obstructive pulmonary disease) (HCC)    mild, no inhalers or oxygen used   Coronary artery disease    no current cardiologist  pt. denies    Deafness in right ear    Depression    Headache    tension headaches due to aneurysm repair with stent   Heart murmur    History of kidney stones    15 to 20 yrs ago   Hyperlipidemia    Hypertension    Hypothyroidism    IBS (irritable bowel syndrome)    Mitral valve prolapse    mild takes beta blocker   Osteopenia    Palpitations    Stroke ([place])    x 3 ministrokes last one feb 2014   Venous insufficiency    Vitamin D deficiency     Family History  Problem Relation Age of Onset   Heart disease Mother    Cancer Mother        Lung   Heart attack Mother    Diabetes Mother    Cancer Father        Lung   Diabetes Sister    Cancer Sister        unkown type   Diabetes Brother    Lung cancer Brother        Lung   AAA (abdominal aortic aneurysm) Neg Hx    Colon cancer Neg Hx    Stomach cancer Neg Hx    Esophageal cancer Neg Hx    Liver cancer Neg Hx    Pancreatic cancer Neg Hx    Rectal cancer Neg Hx     Past Surgical History:  Procedure Laterality Date   ANEURYSM COILING  04/2009   Dr. Estanislado Pandy   APPENDECTOMY     BREAST LUMPECTOMY Right 1986   benign   DIAGNOSTIC LAPAROSCOPY     Proable laparoscopic right colectomy 03-14-18 Dr. Excell Seltzer   EYE SURGERY Bilateral    ioc lens for cataracts   LAPAROSCOPIC RIGHT COLECTOMY Right 03/14/2018   Procedure: LAPAROSCOPIC ASSISTED  RIGHT COLECTOMY;  Surgeon: Excell Seltzer, MD;  [facility];  Service: General;  Laterality: Right;   LAPAROSCOPY N/A 03/14/2018   Procedure: LAPAROSCOPY;  Surgeon: Excell Seltzer, MD;  [facility];  Service: General;  Laterality: N/A;   LEFT HEART CATHETERIZATION WITH CORONARY ANGIOGRAM N/A 08/05/2014   Procedure: LEFT HEART CATHETERIZATION WITH CORONARY ANGIOGRAM;  Surgeon: Wellington Hampshire, MD;  Location: [facility];  Service: Cardiovascular;  Laterality: N/A;   LUMBAR LAMINECTOMY N/A 02/28/2021   Procedure: LEFT L1-L2 MICRODISCECTOMY AND FASCIECTOMY;  Surgeon: Marybelle Killings, MD;   Location: [facility];  Service: Orthopedics;  Laterality: N/A;   TONSILLECTOMY     TOTAL ABDOMINAL HYSTERECTOMY  1970   complete   TRANSLABYRINTHINE PROCEDURE  2002   [facility] Dr. Vicie Mutters tumor removal   TRANSURETHRAL RESECTION OF BLADDER TUMOR N/A 02/08/2018   Procedure: TRANSURETHRAL RESECTION OF BLADDER TUMOR (TURBT)/ POSTOPERATIVE INSTILLATION OF CHEMO THERAPY;  Surgeon: Festus Aloe, MD;  Location: [facility];  Service: Urology;  Laterality: N/A;   Social History   Occupational History   Occupation: alterations   Occupation: Retired    Comment: Regulatory affairs officer  Tobacco Use   Smoking status: Every Day    Packs/day: 0.25    Years: 50.00    Pack years: 12.50    Types: Cigarettes   Smokeless tobacco: Never   Tobacco comments:    4-5 cigs daily  is aware she needs to quit  Vaping Use   Vaping Use: Never used  Substance and Sexual Activity   Alcohol use: No    Alcohol/week: 0.0 standard drinks   Drug use: No   Sexual activity: Not Currently

## 2021-04-06 NOTE — Progress Notes (Deleted)
Office Visit Note   Patient: Ana Santos           Date of Birth: 23-Jan-1941           MRN: [id number] Visit Date: 04/06/2021              Requested by: Haydee Salter, MD [place],  [place] [address] PCP: Haydee Salter, MD   Assessment & Plan: Visit Diagnoses: No diagnosis found.  Plan: ***  Follow-Up Instructions: No follow-ups on file.   Orders:  No orders of the defined types were placed in this encounter.  No orders of the defined types were placed in this encounter.     Procedures: No procedures performed   Clinical Data: No additional findings.   Subjective: Chief Complaint  Patient presents with   Lower Back - Routine Post Op    HPI  Review of Systems   Objective: Vital Signs: BP (!) 146/75   Pulse 83   Ht 5\' 2"  (1.575 m)   Wt 118 lb (53.5 kg)   BMI 21.58 kg/m   Physical Exam  Ortho Exam  Specialty Comments:  No specialty comments available.  Imaging: No results found.   PMFS History: Patient Active Problem List   Diagnosis Date Noted   Status post lumbar microdiscectomy 03/09/2021   HNP (herniated nucleus pulposus), lumbar 02/28/2021   Herniated intervertebral disc of lumbar spine 01/26/2021   Foraminal stenosis due to intervertebral disc disease 01/21/2021   History of bladder cancer 01/21/2021   Labyrinthitis 12/08/2019   Left-sided low back pain without sciatica 05/05/2019   Visual loss, transient, bilateral 02/21/2019   Essential hypertension 02/21/2019   Colonic mass s/p lap ileocectomy 03/14/2018 03/14/2018   Acute lower GI bleeding 03/14/2018   Chronic anticoagulation 03/14/2018   Chronic tension-type headache, not intractable 05/31/2015   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 ([place]) 03/11/2014   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   RLS (restless legs syndrome) 07/04/2012   History of cerebral aneurysm 03/26/2009   Vitamin D deficiency 08/10/2008    Venous insufficiency 08/10/2008   Disorder of bone and cartilage 12/18/2007   History of acoustic neuroma 08/18/2007   Hypothyroidism 08/18/2007   Tobacco use disorder, continuous 08/18/2007   Mixed hyperlipidemia 07/18/2007   Anxiety with depression 07/18/2007   COPD (chronic obstructive pulmonary disease) with chronic bronchitis ([place]) 07/18/2007   Fibromyalgia 07/18/2007   Past Medical History:  Diagnosis Date   AN (acoustic neuroma) ([place])    Aneurysm ([place])    Anxiety    Bladder cancer ([place])    Cataract    Cerebrovascular disease    Cigarette smoker    COPD (chronic obstructive pulmonary disease) ([place])    mild, no inhalers or oxygen used   Coronary artery disease    no current cardiologist  pt. denies   Deafness in right ear    Depression    Headache    tension headaches due to aneurysm repair with stent   Heart murmur    History of kidney stones    15 to 20 yrs ago   Hyperlipidemia    Hypertension    Hypothyroidism    IBS (irritable bowel syndrome)    Mitral valve prolapse    mild takes beta blocker   Osteopenia    Palpitations    Stroke ([place])    x 3 ministrokes last one feb 2014   Venous insufficiency    Vitamin D deficiency  Family History  Problem Relation Age of Onset   Heart disease Mother    Cancer Mother        Lung   Heart attack Mother    Diabetes Mother    Cancer Father        Lung   Diabetes Sister    Cancer Sister        unkown type   Diabetes Brother    Lung cancer Brother        Lung   AAA (abdominal aortic aneurysm) Neg Hx    Colon cancer Neg Hx    Stomach cancer Neg Hx    Esophageal cancer Neg Hx    Liver cancer Neg Hx    Pancreatic cancer Neg Hx    Rectal cancer Neg Hx     Past Surgical History:  Procedure Laterality Date   ANEURYSM COILING  04/2009   Dr. Estanislado Pandy   APPENDECTOMY     BREAST LUMPECTOMY Right 1986   benign   DIAGNOSTIC LAPAROSCOPY     Proable laparoscopic right colectomy 03-14-18 Dr. Excell Seltzer   EYE  SURGERY Bilateral    ioc lens for cataracts   LAPAROSCOPIC RIGHT COLECTOMY Right 03/14/2018   Procedure: LAPAROSCOPIC ASSISTED  RIGHT COLECTOMY;  Surgeon: Excell Seltzer, MD;  Location: [facility];  Service: General;  Laterality: Right;   LAPAROSCOPY N/A 03/14/2018   Procedure: LAPAROSCOPY;  Surgeon: Excell Seltzer, MD;  Location: [facility];  Service: General;  Laterality: N/A;   LEFT HEART CATHETERIZATION WITH CORONARY ANGIOGRAM N/A 08/05/2014   Procedure: LEFT HEART CATHETERIZATION WITH CORONARY ANGIOGRAM;  Surgeon: Wellington Hampshire, MD;  Location: [facility];  Service: Cardiovascular;  Laterality: N/A;   LUMBAR LAMINECTOMY N/A 02/28/2021   Procedure: LEFT L1-L2 MICRODISCECTOMY AND FASCIECTOMY;  Surgeon: Marybelle Killings, MD;  Location: [facility];  Service: Orthopedics;  Laterality: N/A;   TONSILLECTOMY     TOTAL ABDOMINAL HYSTERECTOMY  1970   complete   TRANSLABYRINTHINE PROCEDURE  2002   [facility] Dr. Vicie Mutters tumor removal   TRANSURETHRAL RESECTION OF BLADDER TUMOR N/A 02/08/2018   Procedure: TRANSURETHRAL RESECTION OF BLADDER TUMOR (TURBT)/ POSTOPERATIVE INSTILLATION OF CHEMO THERAPY;  Surgeon: Festus Aloe, MD;  Location: [facility];  Service: Urology;  Laterality: N/A;   Social History   Occupational History   Occupation: alterations   Occupation: Retired    Comment: Regulatory affairs officer  Tobacco Use   Smoking status: Every Day    Packs/day: 0.25    Years: 50.00    Pack years: 12.50    Types: Cigarettes   Smokeless tobacco: Never   Tobacco comments:    4-5 cigs daily  is aware she needs to quit  Vaping Use   Vaping Use: Never used  Substance and Sexual Activity   Alcohol use: No    Alcohol/week: 0.0 standard drinks   Drug use: No   Sexual activity: Not Currently

## 2021-04-21 ENCOUNTER — Other Ambulatory Visit: Payer: Self-pay | Admitting: Orthopaedic Surgery

## 2021-04-21 NOTE — Addendum Note (Signed)
Addended by: Meyer Cory on: 04/21/2021 04:58 PM   Modules accepted: Orders

## 2021-04-21 NOTE — Telephone Encounter (Signed)
Refill on tramadol

## 2021-04-21 NOTE — Telephone Encounter (Signed)
Please advise 

## 2021-04-21 NOTE — Telephone Encounter (Signed)
I tried to call refill to pharmacy. Spoke with [employer]. He states that this has to be e-prescribed.  I have pended order. Could you please sign? Send to [facility]. Thanks.

## 2021-04-22 MED ORDER — TRAMADOL HCL 50 MG PO TABS: 50.0000 mg | ORAL_TABLET | Freq: Four times a day (QID) | ORAL | 0 refills | Status: DC | PRN

## 2021-04-22 NOTE — Telephone Encounter (Signed)
Sent in; thanks

## 2021-04-26 ENCOUNTER — Ambulatory Visit
Admission: RE | Admit: 2021-04-26 | Discharge: 2021-04-26 | Disposition: A | Discharge status: Home / Self Care | Payer: Medicare Other | Source: Ambulatory Visit | Attending: Orthopaedic Surgery | Admitting: Orthopaedic Surgery

## 2021-04-26 ENCOUNTER — Other Ambulatory Visit: Payer: Self-pay

## 2021-04-26 DIAGNOSIS — M5126 Other intervertebral disc displacement, lumbar region: Secondary | ICD-10-CM | POA: Diagnosis not present

## 2021-04-26 DIAGNOSIS — M47816 Spondylosis without myelopathy or radiculopathy, lumbar region: Secondary | ICD-10-CM | POA: Diagnosis not present

## 2021-04-26 DIAGNOSIS — G8929 Other chronic pain: Secondary | ICD-10-CM | POA: Diagnosis not present

## 2021-04-26 DIAGNOSIS — M48061 Spinal stenosis, lumbar region without neurogenic claudication: Secondary | ICD-10-CM | POA: Diagnosis not present

## 2021-04-26 DIAGNOSIS — Z9889 Other specified postprocedural states: Secondary | ICD-10-CM

## 2021-04-26 MED ORDER — GADOBENATE DIMEGLUMINE 529 MG/ML IV SOLN
10.0000 mL | Freq: Once | INTRAVENOUS | Status: AC | PRN
  Administered 2021-04-26: 10 mL via INTRAVENOUS

## 2021-05-03 ENCOUNTER — Ambulatory Visit (INDEPENDENT_AMBULATORY_CARE_PROVIDER_SITE_OTHER): Payer: Medicare Other | Admitting: Family Medicine

## 2021-05-03 ENCOUNTER — Other Ambulatory Visit: Payer: Self-pay

## 2021-05-03 ENCOUNTER — Encounter: Payer: Self-pay | Admitting: Family Medicine

## 2021-05-03 VITALS — BP 138/68 | HR 67 | Temp 97.3°F | Ht 62.0 in | Wt 112.2 lb

## 2021-05-03 DIAGNOSIS — E782 Mixed hyperlipidemia: Secondary | ICD-10-CM | POA: Diagnosis not present

## 2021-05-03 DIAGNOSIS — F17209 Nicotine dependence, unspecified, with unspecified nicotine-induced disorders: Secondary | ICD-10-CM | POA: Diagnosis not present

## 2021-05-03 DIAGNOSIS — J449 Chronic obstructive pulmonary disease, unspecified: Secondary | ICD-10-CM

## 2021-05-03 DIAGNOSIS — I10 Essential (primary) hypertension: Secondary | ICD-10-CM | POA: Diagnosis not present

## 2021-05-03 DIAGNOSIS — E039 Hypothyroidism, unspecified: Secondary | ICD-10-CM | POA: Diagnosis not present

## 2021-05-03 DIAGNOSIS — M5126 Other intervertebral disc displacement, lumbar region: Secondary | ICD-10-CM | POA: Diagnosis not present

## 2021-05-03 DIAGNOSIS — Z9889 Other specified postprocedural states: Secondary | ICD-10-CM | POA: Diagnosis not present

## 2021-05-03 LAB — TSH: TSH: 0.08 u[IU]/mL — ABNORMAL LOW (ref 0.35–5.50)

## 2021-05-03 NOTE — Progress Notes (Signed)
[facility] [address] [place] [address] Dept: [phone] Dept Fax: [phone]  Chronic Care Office Visit  Subjective:    Patient ID: Ana Santos, female    DOB: [birth year], 80 y.o..   MRN: [id number]  Chief Complaint  Patient presents with   Follow-up    3 month f/u.    History of Present Illness:  Patient is in today for reassessment of chronic medical issues.  Ana Santos has a history of hypertension, managed with amlodipine. She also has prior mitral valve disease and PVS. She also has hyperlipidemia, which is managed with pravastatin.   Ana Santos has a history of hypothyroidism. She had an increase in her Synthroid dose this summer, but has had a persistent mildly elevated TSH.    Ana Santos has a history of COPD. She only occasionally use an albuterol inhaler for this. She has cut back on her smoking and is down to only 1-2 cigarettes a day. She feels like she might be able to quit smoking at this point.    Ana Santos has a history of a fall in Sept. 2021. She had been on a stool, watering some flowers and fell to the ground. She has had chronic low back pain since then. She is currently managed by Dr. Donivan Scull. An MRI scan in may showed multi-level degenerative lumbar disease with foraminal stenoses. She has undergone multiple ESIs without improvement. She had a left L1-L2 microdiscectomy on 02/28/2021. She notes her pain improved temporarily, but now has returned and feels no different than before. She did have a recent repeat MRI scan.  Ana Santos has chronic nausea that she relates to her pain. She notes that her weight had decreased to 107 lbs., but her husband has pushed her to increase her intake. She uses occasional Zofran for nausea, but recently had tried Dramamine and feels this works better.  Past Medical History: Patient Active Problem List   Diagnosis Date Noted   Status post lumbar  microdiscectomy 03/09/2021   HNP (herniated nucleus pulposus), lumbar 02/28/2021   Metatarsal stress fracture of left foot 02/10/2021   Herniated intervertebral disc of lumbar spine 01/26/2021   Foraminal stenosis due to intervertebral disc disease 01/21/2021   History of bladder cancer 01/21/2021   Labyrinthitis 12/08/2019   Left-sided low back pain without sciatica 05/05/2019   Visual loss, transient, bilateral 02/21/2019   Essential hypertension 02/21/2019   Colonic mass s/p lap ileocectomy 03/14/2018 03/14/2018   Acute lower GI bleeding 03/14/2018   Chronic anticoagulation 03/14/2018   Chronic tension-type headache, not intractable 05/31/2015   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 ([place]) 03/11/2014   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   RLS (restless legs syndrome) 07/04/2012   History of cerebral aneurysm 03/26/2009   Vitamin D deficiency 08/10/2008   Venous insufficiency 08/10/2008   Disorder of bone and cartilage 12/18/2007   History of acoustic neuroma 08/18/2007   Hypothyroidism 08/18/2007   Tobacco use disorder, continuous 08/18/2007   Mixed hyperlipidemia 07/18/2007   Anxiety with depression 07/18/2007   COPD (chronic obstructive pulmonary disease) with chronic bronchitis ([place]) 07/18/2007   Fibromyalgia 07/18/2007   Past Surgical History:  Procedure Laterality Date   ANEURYSM COILING  04/2009   Dr. Estanislado Pandy   APPENDECTOMY     BACK SURGERY  02/28/2021   BREAST LUMPECTOMY Right 1986   benign   DIAGNOSTIC LAPAROSCOPY     Proable laparoscopic right colectomy 03-14-18 Dr. Excell Seltzer  EYE SURGERY Bilateral    ioc lens for cataracts   LAPAROSCOPIC RIGHT COLECTOMY Right 03/14/2018   Procedure: LAPAROSCOPIC ASSISTED  RIGHT COLECTOMY;  Surgeon: Excell Seltzer, MD;  [facility];  Service: General;  Laterality: Right;   LAPAROSCOPY N/A 03/14/2018   Procedure: LAPAROSCOPY;  Surgeon: Excell Seltzer, MD;  [facility];   Service: General;  Laterality: N/A;   LEFT HEART CATHETERIZATION WITH CORONARY ANGIOGRAM N/A 08/05/2014   Procedure: LEFT HEART CATHETERIZATION WITH CORONARY ANGIOGRAM;  Surgeon: Wellington Hampshire, MD;  Location: [facility];  Service: Cardiovascular;  Laterality: N/A;   LUMBAR LAMINECTOMY N/A 02/28/2021   Procedure: LEFT L1-L2 MICRODISCECTOMY AND FASCIECTOMY;  Surgeon: Marybelle Killings, MD;  Location: [facility];  Service: Orthopedics;  Laterality: N/A;   TONSILLECTOMY     TOTAL ABDOMINAL HYSTERECTOMY  1970   complete   TRANSLABYRINTHINE PROCEDURE  2002   [facility] Dr. Vicie Mutters tumor removal   TRANSURETHRAL RESECTION OF BLADDER TUMOR N/A 02/08/2018   Procedure: TRANSURETHRAL RESECTION OF BLADDER TUMOR (TURBT)/ POSTOPERATIVE INSTILLATION OF CHEMO THERAPY;  Surgeon: Festus Aloe, MD;  Location: [facility];  Service: Urology;  Laterality: N/A;   Family History  Problem Relation Age of Onset   Heart disease Mother    Cancer Mother        Lung   Heart attack Mother    Diabetes Mother    Cancer Father        Lung   Diabetes Sister    Cancer Sister        unkown type   Diabetes Brother    Lung cancer Brother        Lung   AAA (abdominal aortic aneurysm) Neg Hx    Colon cancer Neg Hx    Stomach cancer Neg Hx    Esophageal cancer Neg Hx    Liver cancer Neg Hx    Pancreatic cancer Neg Hx    Rectal cancer Neg Hx     Outpatient Medications Prior to Visit  Medication Sig Dispense Refill   acetaminophen (TYLENOL) 500 MG tablet Take 500 mg by mouth every 6 (six) hours as needed for mild pain.     amLODipine (NORVASC) 5 MG tablet Take 1 tablet (5 mg total) by mouth daily. 90 tablet 1   Cholecalciferol (VITAMIN D-3) 125 MCG (5000 UT) TABS Take 5,000 Units by mouth daily.     clopidogrel (PLAVIX) 75 MG tablet Take 1 tablet (75 mg total) by mouth daily. 7 tablet 0   ferrous sulfate 325 (65 FE) MG tablet Take 325 mg by mouth daily with breakfast.     gabapentin (NEURONTIN) 100 MG capsule  Take one at night times 1 week then 2 po q HS 60 capsule 2   hydroxypropyl methylcellulose / hypromellose (ISOPTO TEARS / GONIOVISC) 2.5 % ophthalmic solution Place 1 drop into both eyes at bedtime.     levothyroxine (SYNTHROID) 88 MCG tablet Take 1 tablet (88 mcg total) by mouth daily. 90 tablet 0   ondansetron (ZOFRAN) 4 MG tablet Take 1 tablet (4 mg total) by mouth every 8 (eight) hours as needed for nausea or vomiting. 20 tablet 0   pramipexole (MIRAPEX) 0.5 MG tablet Take one nightly one hour prior to going to bed as needed for restless leg syndrome. (Patient taking differently: Take 0.5 mg by mouth at bedtime. Take one nightly one hour prior to going to bed for restless leg syndrome.) 90 tablet 4   pravastatin (PRAVACHOL) 40 MG tablet TAKE  1 TABLET DAILY 90 tablet 3   sertraline (ZOLOFT) 25 MG tablet Take 1 tablet (25 mg total) by mouth daily. 30 tablet 5   traMADol (ULTRAM) 50 MG tablet Take 1 tablet (50 mg total) by mouth every 6 (six) hours as needed. 30 tablet 0   oxyCODONE (OXY IR/ROXICODONE) 5 MG immediate release tablet Take 1-2 tablets (5-10 mg total) by mouth every 6 (six) hours as needed for moderate pain. (Patient not taking: Reported on 05/03/2021) 30 tablet 0   No facility-administered medications prior to visit.   Allergies  Allergen Reactions   Prednisone Other (See Comments)    Hallucinations   Atorvastatin Other (See Comments)     Lipitor caused arm pain (3/09)   Hydrocodone-Acetaminophen Other (See Comments)    hallucinations   Morphine Other (See Comments)    hallucinations   Nortriptyline     Caused nausea and vomiting and shaking, kept her up all night   Topamax [Topiramate] Nausea And Vomiting    *dizziness*   Tramadol Other (See Comments)    withdrawal    Objective:   Today's Vitals   05/03/21 0825  BP: 138/68  Pulse: 67  Temp: (!) 97.3 F (36.3 C)  SpO2: 98%  Weight: 112 lb 3.2 oz (50.9 kg)  Height: 5\' 2"  (1.575 m)   Body mass index is 20.52  kg/m.   General: Well developed, well nourished. No acute distress. Psych: Alert and oriented. Normal mood and affect.  Health Maintenance Due  Topic Date Due   TETANUS/TDAP  Never done   Zoster Vaccines- Shingrix (1 of 2) Never done   Lab Results CBC Latest Ref Rng & Units 02/25/2021 01/21/2021 11/29/2020  WBC 4.0 - 10.5 K/uL 14.5(H) 11.4(H) 10.9(H)  Hemoglobin 12.0 - 15.0 g/dL 12.6 12.4 13.8  Hematocrit 36.0 - 46.0 % 40.4 39.0 41.6  Platelets 150 - 400 K/uL 320 342.0 305.0   BMP Latest Ref Rng & Units 02/25/2021 01/21/2021 11/29/2020  Glucose 70 - 99 mg/dL 88 80 81  BUN 8 - 23 mg/dL 13 14 17   Creatinine 0.44 - 1.00 mg/dL 1.13(H) 0.99 1.00  Sodium 135 - 145 mmol/L 137 141 141  Potassium 3.5 - 5.1 mmol/L 3.8 5.0 4.2  Chloride 98 - 111 mmol/L 108 107 108  CO2 22 - 32 mmol/L 21(L) 25 24  Calcium 8.9 - 10.3 mg/dL 9.3 9.7 9.6   Lab Results  Component Value Date   TSH 6.20 (H) 01/21/2021   Lab Results  Component Value Date   CHOL 170 01/21/2021   HDL 55.30 01/21/2021   LDLCALC 78 01/21/2021   LDLDIRECT 88.0 11/19/2009   TRIG 181.0 (H) 01/21/2021   CHOLHDL 3 01/21/2021   Component Ref Range & Units 3 mo ago 5 mo ago 1 yr ago 2 yr ago 5 yr ago 6 yr ago  VITD 30.00 - 100.00 ng/mL 56.25  48.36  18.77 Low   45.35  55.64  48.00    Imaging: MRI Lumbar Spine w wo contrast (04/26/2021) IMPRESSION: 1. Interval left laminectomy at L1-L2 with new left subarticular disc protrusion posteriorly displacing the descending left L2 nerve root. 2. Otherwise unchanged multilevel lumbar spondylosis as described above. Assessment & Plan:   1. Essential hypertension Blood pressure is at goal on amlodipine. We will continue to follow this.  2. COPD (chronic obstructive pulmonary disease) with chronic bronchitis (HCC) Stable.  3. Tobacco use disorder, continuous I advised Ms. Splitt to try and quit. She is down to a low enough  level of smoking, that she should not need any medication to help  her achieve this. She feels ready at this point. I will plan to follow-up with her at her next visit in regards to her success at stopping.  4. Mixed hyperlipidemia Last lipids at goal on pravastatin. We will reassess at her next visit.  5. Herniated intervertebral disc of lumbar spine 6. Status post lumbar microdiscectomy There is a new disc protrusion displacing the L2 nerve root. I anticipate that neurosurgery may recommend a further surgery on the back.  7. Acquired hypothyroidism I will repeat the TSH today. If it remains elevated, we will increase Ms. Deer levothyroxine further.  - TSH  Haydee Salter, MD

## 2021-05-10 ENCOUNTER — Ambulatory Visit: Payer: Medicare Other | Admitting: Orthopaedic Surgery

## 2021-05-11 ENCOUNTER — Encounter: Payer: Self-pay | Admitting: Orthopaedic Surgery

## 2021-05-11 ENCOUNTER — Other Ambulatory Visit: Payer: Self-pay

## 2021-05-11 ENCOUNTER — Ambulatory Visit (INDEPENDENT_AMBULATORY_CARE_PROVIDER_SITE_OTHER): Payer: Medicare Other | Admitting: Orthopaedic Surgery

## 2021-05-11 VITALS — BP 131/76 | HR 83

## 2021-05-11 DIAGNOSIS — M5442 Lumbago with sciatica, left side: Secondary | ICD-10-CM

## 2021-05-11 DIAGNOSIS — G8929 Other chronic pain: Secondary | ICD-10-CM

## 2021-05-11 DIAGNOSIS — M5126 Other intervertebral disc displacement, lumbar region: Secondary | ICD-10-CM

## 2021-05-11 DIAGNOSIS — Z9889 Other specified postprocedural states: Secondary | ICD-10-CM

## 2021-05-11 MED ORDER — OXYCODONE-ACETAMINOPHEN 5-325 MG PO TABS: 1.0000 | ORAL_TABLET | Freq: Four times a day (QID) | ORAL | 0 refills | Status: DC | PRN

## 2021-05-11 NOTE — Progress Notes (Signed)
Post-Op Visit Note   Patient: Ana Santos           Date of Birth: [birth year]           MRN: [id number] Visit Date: 05/11/2021 PCP: Haydee Salter, MD   Assessment & Plan: Patient turns post microdiscectomy left L1-L2 on 02/28/2021.  Patient did well for 2 weeks postop and then had significant increase pain.  MRI scan has been obtained and is listed below.  Patient has recurrent disc herniation occurred about 2 weeks after the surgery.  We will restart the Neurontin and Percocet sent in for pain.  Set her up for epidural with Dr. Ernestina Patches on the left at L1-2.  We discussed with her the pain when she is rating it 10 out of 10 does not resolve then repeat surgery remains an option.  Patient has slight weakness hip flexor.  Trace quad weakness on the left she is ambulatory without a cane.  Problems walking stairs with weakness of the left leg.  Recheck 3 weeks.      Narrative & Impression  CLINICAL DATA:  Chronic low back pain.  Recent surgery in September.   EXAM: MRI LUMBAR SPINE WITHOUT AND WITH CONTRAST   TECHNIQUE: Multiplanar and multiecho pulse sequences of the lumbar spine were obtained without and with intravenous contrast.   CONTRAST:  62mL MULTIHANCE GADOBENATE DIMEGLUMINE 529 MG/ML IV SOLN   COMPARISON:  MRI lumbar spine dated Oct 23, 2020.   FINDINGS: Segmentation:  Standard.   Alignment:  Unchanged trace anterolisthesis at L5-S1.   Vertebrae:  No fracture, evidence of discitis, or bone lesion.   Conus medullaris and cauda equina: Conus extends to the L2 level. Conus and cauda equina appear normal. No intradural enhancement.   Paraspinal and other soft tissues: Unchanged small left adrenal adenoma.   Disc levels:   T12-L1:  No significant disc bulge or herniation.  No stenosis.   L1-L2: Interval left laminectomy. New left subarticular disc protrusion posteriorly displacing the descending left L2 nerve root. No stenosis.   L2-L3: Unchanged mild disc  bulging and bilateral facet arthropathy. Unchanged mild left lateral recess stenosis. No spinal canal or neuroforaminal stenosis.   L3-L4: Unchanged mild disc bulging and moderate left facet arthropathy unchanged mild bilateral lateral recess stenosis and mild-to-moderate left neuroforaminal stenosis. No spinal canal stenosis.   L4-L5: Unchanged mild disc bulging and bilateral facet arthropathy. Unchanged moderate right and mild left neuroforaminal stenosis. No spinal canal stenosis.   L5-S1: Unchanged mild disc bulging with severe right and moderate left facet arthropathy. Unchanged mild right neuroforaminal stenosis. No spinal canal or left neuroforaminal stenosis.   IMPRESSION: 1. Interval left laminectomy at L1-L2 with new left subarticular disc protrusion posteriorly displacing the descending left L2 nerve root. 2. Otherwise unchanged multilevel lumbar spondylosis as described above.     Electronically Signed   By: Titus Dubin M.D.   On: 04/26/2021 15:58      Chief Complaint:  Chief Complaint  Patient presents with   Lower Back - Follow-up   Visit Diagnoses:  1. Status post lumbar microdiscectomy   2. Chronic left-sided low back pain with left-sided sciatica   3. Recurrent herniation of lumbar disc     Plan: Restart Neurontin, Percocet for pain, epidural steroid.  Recheck 3 weeks.  Follow-Up Instructions: Return in about 3 weeks (around 06/01/2021).   Orders:  Orders Placed This Encounter  Procedures   Ambulatory referral to Physical Medicine Rehab   Meds ordered this  encounter  Medications   oxyCODONE-acetaminophen (PERCOCET/ROXICET) 5-325 MG tablet    Sig: Take 1-2 tablets by mouth every 6 (six) hours as needed for severe pain.    Dispense:  30 tablet    Refill:  0    Post op recurrent disc herniation    Imaging: No results found.  PMFS History: Patient Active Problem List   Diagnosis Date Noted   Status post lumbar microdiscectomy  03/09/2021   Recurrent herniation of lumbar disc 02/28/2021   Metatarsal stress fracture of left foot 02/10/2021   Herniated intervertebral disc of lumbar spine 01/26/2021   Foraminal stenosis due to intervertebral disc disease 01/21/2021   History of bladder cancer 01/21/2021   Labyrinthitis 12/08/2019   Left-sided low back pain without sciatica 05/05/2019   Visual loss, transient, bilateral 02/21/2019   Essential hypertension 02/21/2019   Colonic mass s/p lap ileocectomy 03/14/2018 03/14/2018   Acute lower GI bleeding 03/14/2018   Chronic anticoagulation 03/14/2018   Chronic tension-type headache, not intractable 05/31/2015   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 ([place]) 03/11/2014   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   RLS (restless legs syndrome) 07/04/2012   History of cerebral aneurysm 03/26/2009   Vitamin D deficiency 08/10/2008   Venous insufficiency 08/10/2008   Disorder of bone and cartilage 12/18/2007   History of acoustic neuroma 08/18/2007   Hypothyroidism 08/18/2007   Tobacco use disorder, continuous 08/18/2007   Mixed hyperlipidemia 07/18/2007   Anxiety with depression 07/18/2007   COPD (chronic obstructive pulmonary disease) with chronic bronchitis ([place]) 07/18/2007   Fibromyalgia 07/18/2007   Past Medical History:  Diagnosis Date   AN (acoustic neuroma) ([place])    Aneurysm ([place])    Anxiety    Bladder cancer ([place])    Cataract    Cerebrovascular disease    Cigarette smoker    COPD (chronic obstructive pulmonary disease) (HCC)    mild, no inhalers or oxygen used   Coronary artery disease    no current cardiologist  pt. denies   Deafness in right ear    Depression    Headache    tension headaches due to aneurysm repair with stent   Heart murmur    History of kidney stones    15 to 20 yrs ago   Hyperlipidemia    Hypertension    Hypothyroidism    IBS (irritable bowel syndrome)    Mitral valve prolapse    mild takes  beta blocker   Osteopenia    Palpitations    Stroke ([place])    x 3 ministrokes last one feb 2014   Venous insufficiency    Vitamin D deficiency     Family History  Problem Relation Age of Onset   Heart disease Mother    Cancer Mother        Lung   Heart attack Mother    Diabetes Mother    Cancer Father        Lung   Diabetes Sister    Cancer Sister        unkown type   Diabetes Brother    Lung cancer Brother        Lung   AAA (abdominal aortic aneurysm) Neg Hx    Colon cancer Neg Hx    Stomach cancer Neg Hx    Esophageal cancer Neg Hx    Liver cancer Neg Hx    Pancreatic cancer Neg Hx    Rectal cancer Neg Hx     Past Surgical  History:  Procedure Laterality Date   ANEURYSM COILING  04/2009   Dr. Estanislado Pandy   APPENDECTOMY     BACK SURGERY  02/28/2021   BREAST LUMPECTOMY Right 1986   benign   DIAGNOSTIC LAPAROSCOPY     Proable laparoscopic right colectomy 03-14-18 Dr. Excell Seltzer   EYE SURGERY Bilateral    ioc lens for cataracts   LAPAROSCOPIC RIGHT COLECTOMY Right 03/14/2018   Procedure: LAPAROSCOPIC ASSISTED  RIGHT COLECTOMY;  Surgeon: Excell Seltzer, MD;  Location: [facility];  Service: General;  Laterality: Right;   LAPAROSCOPY N/A 03/14/2018   Procedure: LAPAROSCOPY;  Surgeon: Excell Seltzer, MD;  Location: [facility];  Service: General;  Laterality: N/A;   LEFT HEART CATHETERIZATION WITH CORONARY ANGIOGRAM N/A 08/05/2014   Procedure: LEFT HEART CATHETERIZATION WITH CORONARY ANGIOGRAM;  Surgeon: Wellington Hampshire, MD;  Location: [facility];  Service: Cardiovascular;  Laterality: N/A;   LUMBAR LAMINECTOMY N/A 02/28/2021   Procedure: LEFT L1-L2 MICRODISCECTOMY AND FASCIECTOMY;  Surgeon: Marybelle Killings, MD;  Location: [facility];  Service: Orthopedics;  Laterality: N/A;   TONSILLECTOMY     TOTAL ABDOMINAL HYSTERECTOMY  1970   complete   TRANSLABYRINTHINE PROCEDURE  2002   [facility] Dr. Vicie Mutters tumor removal   TRANSURETHRAL RESECTION OF BLADDER TUMOR N/A 02/08/2018   Procedure:  TRANSURETHRAL RESECTION OF BLADDER TUMOR (TURBT)/ POSTOPERATIVE INSTILLATION OF CHEMO THERAPY;  Surgeon: Festus Aloe, MD;  Location[facility];  Service: Urology;  Laterality: N/A;   Social History   Occupational History   Occupation: alterations   Occupation: Retired    Comment: Regulatory affairs officer  Tobacco Use   Smoking status: Every Day    Packs/day: 0.25    Years: 50.00    Pack years: 12.50    Types: Cigarettes   Smokeless tobacco: Never   Tobacco comments:    4-5 cigs daily  is aware she needs to quit  Vaping Use   Vaping Use: Never used  Substance and Sexual Activity   Alcohol use: No    Alcohol/week: 0.0 standard drinks   Drug use: No   Sexual activity: Not Currently

## 2021-05-17 ENCOUNTER — Telehealth: Payer: Self-pay

## 2021-05-17 ENCOUNTER — Ambulatory Visit: Payer: Medicare Other

## 2021-05-17 NOTE — Telephone Encounter (Signed)
Called patient for [place] 815am with no answer , patient may reschedule for next available appointment.  L.Rusty Glodowski,LPN

## 2021-05-24 ENCOUNTER — Ambulatory Visit: Payer: Medicare Other | Admitting: Physical Medicine and Rehabilitation

## 2021-05-25 ENCOUNTER — Ambulatory Visit: Payer: Self-pay

## 2021-05-25 ENCOUNTER — Ambulatory Visit (INDEPENDENT_AMBULATORY_CARE_PROVIDER_SITE_OTHER): Payer: Medicare Other | Admitting: Physical Medicine and Rehabilitation

## 2021-05-25 ENCOUNTER — Other Ambulatory Visit: Payer: Self-pay

## 2021-05-25 ENCOUNTER — Encounter: Payer: Self-pay | Admitting: Physical Medicine and Rehabilitation

## 2021-05-25 VITALS — BP 107/73 | HR 75

## 2021-05-25 DIAGNOSIS — M961 Postlaminectomy syndrome, not elsewhere classified: Secondary | ICD-10-CM

## 2021-05-25 DIAGNOSIS — M5416 Radiculopathy, lumbar region: Secondary | ICD-10-CM

## 2021-05-25 MED ORDER — DEXAMETHASONE SODIUM PHOSPHATE 10 MG/ML IJ SOLN
15.0000 mg | Freq: Once | INTRAMUSCULAR | Status: AC
  Administered 2021-05-25: 15 mg

## 2021-05-25 NOTE — Patient Instructions (Signed)

## 2021-05-25 NOTE — Progress Notes (Signed)
Pt state lower back pain that travels to her left hip. Pt state walking and getting out of bed in the morning makes the pain worse. Pt state she take over the counter pain meds to help ease her pain.  Numeric Pain Rating Scale and Functional Assessment Average Pain 8   In the last MONTH (on 0-10 scale) has pain interfered with the following?  1. General activity like being  able to carry out your everyday physical activities such as walking, climbing stairs, carrying groceries, or moving a chair?  Rating(10)   +Driver, +BT, -Dye Allergies.

## 2021-05-26 NOTE — Procedures (Signed)
Lumbosacral Transforaminal Epidural Steroid Injection - Sub-Pedicular Approach with Fluoroscopic Guidance  Patient: Ana Santos      Date of Birth: [birth year] MRN: [id number] PCP: Haydee Salter, MD      Visit Date: 05/25/2021   Universal Protocol:    Date/Time: 05/25/2021  Consent Given By: the patient  Position: PRONE  Additional Comments: Vital signs were monitored before and after the procedure. Patient was prepped and draped in the usual sterile fashion. The correct patient, procedure, and site was verified.   Injection Procedure Details:   Procedure diagnoses: Lumbar radiculopathy [M54.16]    Meds Administered:  Meds ordered this encounter  Medications   dexamethasone (DECADRON) injection 15 mg    Laterality: Left  Location/Site: L1  Needle:3.5 in., 22 ga.  Short bevel or Quincke spinal needle  Needle Placement: Transforaminal  Findings:    -Comments: Excellent flow of contrast along the nerve, nerve root and into the epidural space.  Procedure Details: After squaring off the end-plates to get a true AP view, the C-arm was positioned so that an oblique view of the foramen as noted above was visualized. The target area is just inferior to the "nose of the scotty dog" or sub pedicular. The soft tissues overlying this structure were infiltrated with 2-3 ml. of 1% Lidocaine without Epinephrine.  The spinal needle was inserted toward the target using a "trajectory" view along the fluoroscope beam.  Under AP and lateral visualization, the needle was advanced so it did not puncture dura and was located close the 6 O'Clock position of the pedical in AP tracterory. Biplanar projections were used to confirm position. Aspiration was confirmed to be negative for CSF and/or blood. A 1-2 ml. volume of Isovue-250 was injected and flow of contrast was noted at each level. Radiographs were obtained for documentation purposes.   After attaining the desired flow of contrast  documented above, a 0.5 to 1.0 ml test dose of 0.25% Marcaine was injected into each respective transforaminal space.  The patient was observed for 90 seconds post injection.  After no sensory deficits were reported, and normal lower extremity motor function was noted,   the above injectate was administered so that equal amounts of the injectate were placed at each foramen (level) into the transforaminal epidural space.   Additional Comments:  No complications occurred Dressing: 2 x 2 sterile gauze and Band-Aid    Post-procedure details: Patient was observed during the procedure. Post-procedure instructions were reviewed.  Patient left the clinic in stable condition.

## 2021-05-26 NOTE — Progress Notes (Signed)
Ana Santos - 80 y.o. female MRN [id number]  Date of birth: 11-18-1940  Office Visit Note: Visit Date: 05/25/2021 PCP: Haydee Salter, MD Referred by: Haydee Salter, MD  Subjective: Chief Complaint  Patient presents with   Lower Back - Pain   Left Hip - Pain   HPI:  Ana Santos is a 80 y.o. female who comes in today at the request of Dr. Rodell Perna for planned Left L1-2 Lumbar Transforaminal epidural steroid injection with fluoroscopic guidance.  The patient has failed conservative care including home exercise, medications, time and activity modification.  This injection will be diagnostic and hopefully therapeutic.  Please see requesting physician notes for further details and justification.  ROS Otherwise per HPI.  Assessment & Plan: Visit Diagnoses:    ICD-10-CM   1. Lumbar radiculopathy  M54.16 XR C-ARM NO REPORT    Epidural Steroid injection    dexamethasone (DECADRON) injection 15 mg    2. Post laminectomy syndrome  M96.1 XR C-ARM NO REPORT    Epidural Steroid injection    dexamethasone (DECADRON) injection 15 mg      Plan: No additional findings.   Meds & Orders:  Meds ordered this encounter  Medications   dexamethasone (DECADRON) injection 15 mg    Orders Placed This Encounter  Procedures   XR C-ARM NO REPORT   Epidural Steroid injection    Follow-up: Return for Rodell Perna, MD as scheduled.   Procedures: No procedures performed  Lumbosacral Transforaminal Epidural Steroid Injection - Sub-Pedicular Approach with Fluoroscopic Guidance  Patient: Ana Santos      Date of Birth: December 31, 1940 MRN: [id number] PCP: Haydee Salter, MD      Visit Date: 05/25/2021   Universal Protocol:    Date/Time: 05/25/2021  Consent Given By: the patient  Position: PRONE  Additional Comments: Vital signs were monitored before and after the procedure. Patient was prepped and draped in the usual sterile fashion. The correct patient, procedure, and site  was verified.   Injection Procedure Details:   Procedure diagnoses: Lumbar radiculopathy [M54.16]    Meds Administered:  Meds ordered this encounter  Medications   dexamethasone (DECADRON) injection 15 mg    Laterality: Left  Location/Site: L1  Needle:3.5 in., 22 ga.  Short bevel or Quincke spinal needle  Needle Placement: Transforaminal  Findings:    -Comments: Excellent flow of contrast along the nerve, nerve root and into the epidural space.  Procedure Details: After squaring off the end-plates to get a true AP view, the C-arm was positioned so that an oblique view of the foramen as noted above was visualized. The target area is just inferior to the "nose of the scotty dog" or sub pedicular. The soft tissues overlying this structure were infiltrated with 2-3 ml. of 1% Lidocaine without Epinephrine.  The spinal needle was inserted toward the target using a "trajectory" view along the fluoroscope beam.  Under AP and lateral visualization, the needle was advanced so it did not puncture dura and was located close the 6 O'Clock position of the pedical in AP tracterory. Biplanar projections were used to confirm position. Aspiration was confirmed to be negative for CSF and/or blood. A 1-2 ml. volume of Isovue-250 was injected and flow of contrast was noted at each level. Radiographs were obtained for documentation purposes.   After attaining the desired flow of contrast documented above, a 0.5 to 1.0 ml test dose of 0.25% Marcaine was injected into each respective transforaminal space.  The  patient was observed for 90 seconds post injection.  After no sensory deficits were reported, and normal lower extremity motor function was noted,   the above injectate was administered so that equal amounts of the injectate were placed at each foramen (level) into the transforaminal epidural space.   Additional Comments:  No complications occurred Dressing: 2 x 2 sterile gauze and Band-Aid     Post-procedure details: Patient was observed during the procedure. Post-procedure instructions were reviewed.  Patient left the clinic in stable condition.   Clinical History: MRI LUMBAR SPINE WITHOUT AND WITH CONTRAST   TECHNIQUE: Multiplanar and multiecho pulse sequences of the lumbar spine were obtained without and with intravenous contrast.   CONTRAST:  37mL MULTIHANCE GADOBENATE DIMEGLUMINE 529 MG/ML IV SOLN   COMPARISON:  MRI lumbar spine dated Oct 23, 2020.   FINDINGS: Segmentation:  Standard.   Alignment:  Unchanged trace anterolisthesis at L5-S1.   Vertebrae:  No fracture, evidence of discitis, or bone lesion.   Conus medullaris and cauda equina: Conus extends to the L2 level. Conus and cauda equina appear normal. No intradural enhancement.   Paraspinal and other soft tissues: Unchanged small left adrenal adenoma.   Disc levels:   T12-L1:  No significant disc bulge or herniation.  No stenosis.   L1-L2: Interval left laminectomy. New left subarticular disc protrusion posteriorly displacing the descending left L2 nerve root. No stenosis.   L2-L3: Unchanged mild disc bulging and bilateral facet arthropathy. Unchanged mild left lateral recess stenosis. No spinal canal or neuroforaminal stenosis.   L3-L4: Unchanged mild disc bulging and moderate left facet arthropathy unchanged mild bilateral lateral recess stenosis and mild-to-moderate left neuroforaminal stenosis. No spinal canal stenosis.   L4-L5: Unchanged mild disc bulging and bilateral facet arthropathy. Unchanged moderate right and mild left neuroforaminal stenosis. No spinal canal stenosis.   L5-S1: Unchanged mild disc bulging with severe right and moderate left facet arthropathy. Unchanged mild right neuroforaminal stenosis. No spinal canal or left neuroforaminal stenosis.   IMPRESSION: 1. Interval left laminectomy at L1-L2 with new left subarticular disc protrusion posteriorly displacing the  descending left L2 nerve root. 2. Otherwise unchanged multilevel lumbar spondylosis as described above.     Electronically Signed   By: Titus Dubin M.D.   On: 04/26/2021 15:58     Objective:  VS:  HT:     WT:    BMI:      BP:107/73   HR:75bpm   TEMP: ( )   RESP:  Physical Exam Vitals and nursing note reviewed.  Constitutional:      General: She is not in acute distress.    Appearance: Normal appearance. She is not ill-appearing.  HENT:     Head: Normocephalic and atraumatic.     Right Ear: External ear normal.     Left Ear: External ear normal.  Eyes:     Extraocular Movements: Extraocular movements intact.  Cardiovascular:     Rate and Rhythm: Normal rate.     Pulses: Normal pulses.  Pulmonary:     Effort: Pulmonary effort is normal. No respiratory distress.  Abdominal:     General: There is no distension.     Palpations: Abdomen is soft.  Musculoskeletal:        General: Tenderness present.     Cervical back: Neck supple.     Right lower leg: No edema.     Left lower leg: No edema.     Comments: Patient has good distal strength with no pain over  the greater trochanters.  No clonus or focal weakness.  Skin:    Findings: No erythema, lesion or rash.  Neurological:     General: No focal deficit present.     Mental Status: She is alert and oriented to person, place, and time.     Sensory: No sensory deficit.     Motor: No weakness or abnormal muscle tone.     Coordination: Coordination normal.  Psychiatric:        Mood and Affect: Mood normal.        Behavior: Behavior normal.     Imaging: XR C-ARM NO REPORT  Result Date: 05/25/2021 Please see Notes tab for imaging impression.

## 2021-05-27 ENCOUNTER — Telehealth: Payer: Self-pay | Admitting: Orthopaedic Surgery

## 2021-05-27 ENCOUNTER — Other Ambulatory Visit: Payer: Self-pay | Admitting: Orthopaedic Surgery

## 2021-05-27 MED ORDER — OXYCODONE-ACETAMINOPHEN 5-325 MG PO TABS: 1.0000 | ORAL_TABLET | Freq: Four times a day (QID) | ORAL | 0 refills | Status: DC | PRN

## 2021-05-27 NOTE — Telephone Encounter (Signed)
Patient requesting medication refill for oxycodone

## 2021-05-27 NOTE — Telephone Encounter (Signed)
noted 

## 2021-05-27 NOTE — Telephone Encounter (Signed)
Please advise 

## 2021-06-01 ENCOUNTER — Other Ambulatory Visit: Payer: Self-pay | Admitting: Family Medicine

## 2021-06-01 ENCOUNTER — Other Ambulatory Visit: Payer: Self-pay

## 2021-06-01 ENCOUNTER — Encounter: Payer: Self-pay | Admitting: Orthopaedic Surgery

## 2021-06-01 ENCOUNTER — Ambulatory Visit (INDEPENDENT_AMBULATORY_CARE_PROVIDER_SITE_OTHER): Payer: Medicare Other | Admitting: Orthopaedic Surgery

## 2021-06-01 VITALS — BP 164/78 | HR 70 | Ht 62.0 in | Wt 112.0 lb

## 2021-06-01 DIAGNOSIS — E039 Hypothyroidism, unspecified: Secondary | ICD-10-CM

## 2021-06-01 DIAGNOSIS — M5126 Other intervertebral disc displacement, lumbar region: Secondary | ICD-10-CM | POA: Diagnosis not present

## 2021-06-01 DIAGNOSIS — M5442 Lumbago with sciatica, left side: Secondary | ICD-10-CM

## 2021-06-01 DIAGNOSIS — I6350 Cerebral infarction due to unspecified occlusion or stenosis of unspecified cerebral artery: Secondary | ICD-10-CM | POA: Diagnosis not present

## 2021-06-01 DIAGNOSIS — G8929 Other chronic pain: Secondary | ICD-10-CM

## 2021-06-01 DIAGNOSIS — Z9889 Other specified postprocedural states: Secondary | ICD-10-CM

## 2021-06-01 MED ORDER — GABAPENTIN 100 MG PO CAPS: ORAL_CAPSULE | ORAL | 2 refills | Status: DC

## 2021-06-01 NOTE — Progress Notes (Signed)
Office Visit Note   Patient: Ana Santos           Date of Birth: 24-Mar-1941           MRN: [id number] Visit Date: 06/01/2021              Requested by: Haydee Salter, MD [place],  [place] [address] PCP: Haydee Salter, MD   Assessment & Plan: Visit Diagnoses:  1. Status post lumbar microdiscectomy     Plan: We will check patient back in 2 weeks.  Plan is to gradually wean off pain medication.  She had problems with this in the past with some withdrawal symptoms.  Recheck 2 weeks.  Gabapentin refill sent in.  Follow-Up Instructions: Return in about 2 weeks (around 06/15/2021).   Orders:  No orders of the defined types were placed in this encounter.  Meds ordered this encounter  Medications   gabapentin (NEURONTIN) 100 MG capsule    Sig: Take  2 po q HS    Dispense:  60 capsule    Refill:  2      Procedures: No procedures performed   Clinical Data: No additional findings.   Subjective: Chief Complaint  Patient presents with   Lower Back - Follow-up    02/28/2021 Left L1-L2 microdiscectomy    HPI 80 year old female returns post L1-2 left microdiscectomy.  She had past history of the long-term tramadol usage with significant withdrawal symptoms when she stopped it over a year ago.  Recently she has been using occasional Percocet sometimes 1 daily.  She uses the Neurontin at night and was concerned she was running out and only took 1 at night set of 2.  Refills of Neurontin was sent in today.  She states she is walking better pain is significantly better.  Patient states she had an epidural and she states it did not seem like it helped at all.  Postop MRI scan done 04/26/2021 showed left subarticular disc protrusion with slight posterior displacement of the descending left L2 nerve root but no stenosis.    Review of Systems all other systems updated unchanged.   Objective: Vital Signs: BP (!) 164/78    Pulse 70    Ht 5\' 2"  (1.575 m)     Wt 112 lb (50.8 kg)    BMI 20.49 kg/m   Physical Exam Constitutional:      Appearance: She is well-developed.  HENT:     Head: Normocephalic.     Right Ear: External ear normal.     Left Ear: External ear normal. There is no impacted cerumen.  Eyes:     Pupils: Pupils are equal, round, and reactive to light.  Neck:     Thyroid: No thyromegaly.     Trachea: No tracheal deviation.  Cardiovascular:     Rate and Rhythm: Normal rate.  Pulmonary:     Effort: Pulmonary effort is normal.  Abdominal:     Palpations: Abdomen is soft.  Musculoskeletal:     Cervical back: No rigidity.  Skin:    General: Skin is warm and dry.  Neurological:     Mental Status: She is alert and oriented to person, place, and time.  Psychiatric:        Behavior: Behavior normal.    Ortho Exam patient gets from sitting standing comfortably amatory without limp.  No quad or hip flexion weakness.  Specialty Comments:  No specialty comments available.  Imaging: No results found.  PMFS History: Patient Active Problem List   Diagnosis Date Noted   Status post lumbar microdiscectomy 03/09/2021   Recurrent herniation of lumbar disc 02/28/2021   Metatarsal stress fracture of left foot 02/10/2021   Herniated intervertebral disc of lumbar spine 01/26/2021   Foraminal stenosis due to intervertebral disc disease 01/21/2021   History of bladder cancer 01/21/2021   Labyrinthitis 12/08/2019   Left-sided low back pain without sciatica 05/05/2019   Visual loss, transient, bilateral 02/21/2019   Essential hypertension 02/21/2019   Colonic mass s/p lap ileocectomy 03/14/2018 03/14/2018   Acute lower GI bleeding 03/14/2018   Chronic anticoagulation 03/14/2018   Chronic tension-type headache, not intractable 05/31/2015   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 ([place]) 03/11/2014   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   RLS (restless legs syndrome) 07/04/2012   History of  cerebral aneurysm 03/26/2009   Vitamin D deficiency 08/10/2008   Venous insufficiency 08/10/2008   Disorder of bone and cartilage 12/18/2007   History of acoustic neuroma 08/18/2007   Hypothyroidism 08/18/2007   Tobacco use disorder, continuous 08/18/2007   Mixed hyperlipidemia 07/18/2007   Anxiety with depression 07/18/2007   COPD (chronic obstructive pulmonary disease) with chronic bronchitis ([place]) 07/18/2007   Fibromyalgia 07/18/2007   Past Medical History:  Diagnosis Date   AN (acoustic neuroma) ([place])    Aneurysm ([place])    Anxiety    Bladder cancer ([place])    Cataract    Cerebrovascular disease    Cigarette smoker    COPD (chronic obstructive pulmonary disease) (HCC)    mild, no inhalers or oxygen used   Coronary artery disease    no current cardiologist  pt. denies   Deafness in right ear    Depression    Headache    tension headaches due to aneurysm repair with stent   Heart murmur    History of kidney stones    15 to 20 yrs ago   Hyperlipidemia    Hypertension    Hypothyroidism    IBS (irritable bowel syndrome)    Mitral valve prolapse    mild takes beta blocker   Osteopenia    Palpitations    Stroke ([place])    x 3 ministrokes last one feb 2014   Venous insufficiency    Vitamin D deficiency     Family History  Problem Relation Age of Onset   Heart disease Mother    Cancer Mother        Lung   Heart attack Mother    Diabetes Mother    Cancer Father        Lung   Diabetes Sister    Cancer Sister        unkown type   Diabetes Brother    Lung cancer Brother        Lung   AAA (abdominal aortic aneurysm) Neg Hx    Colon cancer Neg Hx    Stomach cancer Neg Hx    Esophageal cancer Neg Hx    Liver cancer Neg Hx    Pancreatic cancer Neg Hx    Rectal cancer Neg Hx     Past Surgical History:  Procedure Laterality Date   ANEURYSM COILING  04/2009   Dr. Estanislado Pandy   APPENDECTOMY     BACK SURGERY  02/28/2021   BREAST LUMPECTOMY Right 1986   benign    DIAGNOSTIC LAPAROSCOPY     Proable laparoscopic right colectomy 03-14-18 Dr. Excell Seltzer   EYE SURGERY Bilateral  ioc lens for cataracts   LAPAROSCOPIC RIGHT COLECTOMY Right 03/14/2018   Procedure: LAPAROSCOPIC ASSISTED  RIGHT COLECTOMY;  Surgeon: Excell Seltzer, MD;  Location: [facility];  Service: General;  Laterality: Right;   LAPAROSCOPY N/A 03/14/2018   Procedure: LAPAROSCOPY;  Surgeon: Excell Seltzer, MD;  Location: [facility];  Service: General;  Laterality: N/A;   LEFT HEART CATHETERIZATION WITH CORONARY ANGIOGRAM N/A 08/05/2014   Procedure: LEFT HEART CATHETERIZATION WITH CORONARY ANGIOGRAM;  Surgeon: Wellington Hampshire, MD;  Location: [facility];  Service: Cardiovascular;  Laterality: N/A;   LUMBAR LAMINECTOMY N/A 02/28/2021   Procedure: LEFT L1-L2 MICRODISCECTOMY AND FASCIECTOMY;  Surgeon: Marybelle Killings, MD;  Location: [facility];  Service: Orthopedics;  Laterality: N/A;   TONSILLECTOMY     TOTAL ABDOMINAL HYSTERECTOMY  1970   complete   TRANSLABYRINTHINE PROCEDURE  2002   [facility] Dr. Vicie Mutters tumor removal   TRANSURETHRAL RESECTION OF BLADDER TUMOR N/A 02/08/2018   Procedure: TRANSURETHRAL RESECTION OF BLADDER TUMOR (TURBT)/ POSTOPERATIVE INSTILLATION OF CHEMO THERAPY;  Surgeon: Festus Aloe, MD;  Location: [facility];  Service: Urology;  Laterality: N/A;   Social History   Occupational History   Occupation: alterations   Occupation: Retired    Comment: Regulatory affairs officer  Tobacco Use   Smoking status: Every Day    Packs/day: 0.25    Years: 50.00    Pack years: 12.50    Types: Cigarettes   Smokeless tobacco: Never   Tobacco comments:    4-5 cigs daily  is aware she needs to quit  Vaping Use   Vaping Use: Never used  Substance and Sexual Activity   Alcohol use: No    Alcohol/week: 0.0 standard drinks   Drug use: No   Sexual activity: Not Currently

## 2021-06-10 ENCOUNTER — Other Ambulatory Visit: Payer: Self-pay | Admitting: Orthopaedic Surgery

## 2021-06-10 ENCOUNTER — Telehealth: Payer: Self-pay | Admitting: Orthopaedic Surgery

## 2021-06-10 MED ORDER — OXYCODONE-ACETAMINOPHEN 5-325 MG PO TABS: 1.0000 | ORAL_TABLET | Freq: Four times a day (QID) | ORAL | 0 refills | Status: DC | PRN

## 2021-06-10 NOTE — Telephone Encounter (Signed)
noted 

## 2021-06-10 NOTE — Telephone Encounter (Signed)
Please advise 

## 2021-06-10 NOTE — Telephone Encounter (Signed)
Pt calling to get a refill on her oxycodone prescription. The best pharmacy is [facility], [place] and the best call back number if needed is [phone].

## 2021-06-15 ENCOUNTER — Encounter: Payer: Self-pay | Admitting: Orthopaedic Surgery

## 2021-06-15 ENCOUNTER — Other Ambulatory Visit: Payer: Self-pay

## 2021-06-15 ENCOUNTER — Ambulatory Visit (INDEPENDENT_AMBULATORY_CARE_PROVIDER_SITE_OTHER): Payer: Medicare Other | Admitting: Orthopaedic Surgery

## 2021-06-15 VITALS — BP 156/71 | HR 73 | Ht 62.0 in | Wt 112.0 lb

## 2021-06-15 DIAGNOSIS — M961 Postlaminectomy syndrome, not elsewhere classified: Secondary | ICD-10-CM | POA: Diagnosis not present

## 2021-06-15 DIAGNOSIS — M5126 Other intervertebral disc displacement, lumbar region: Secondary | ICD-10-CM

## 2021-06-15 NOTE — Progress Notes (Signed)
Office Visit Note   Patient: Ana Santos           Date of Birth: Oct 22, 1940           MRN: [id number] Visit Date: 06/15/2021              Requested by: Haydee Salter, MD [place],  [place] [address] PCP: Haydee Salter, MD   Assessment & Plan: Visit Diagnoses:  1. Herniated intervertebral disc of lumbar spine   2. Lumbar post-laminectomy syndrome     Plan: We will set her up for some physical therapy with TENS unit trial and ultrasound.  Recheck 3 weeks.  Hopefully she can get some relief with modalities.  Follow-Up Instructions: No follow-ups on file.   Orders:  No orders of the defined types were placed in this encounter.  No orders of the defined types were placed in this encounter.     Procedures: No procedures performed   Clinical Data: No additional findings.   Subjective: Chief Complaint  Patient presents with   Lower Back - Follow-up, Pain    02/28/2021 left L1-2 microdiscectomy    HPI 81 year old female with L1-L2 left recurrent disc herniation that occurred a few months after surgery.  She has been on Neurontin occasionally using Percocet does not take more than 1 a day.  Past history of being on tramadol which she stopped she went through withdrawal symptoms.  She has been ambulatory and states she has back pain that radiates around her left hip and her left thigh.  She has been able to walk up steps.  We tried epidural which did not give her relief.  Review of Systems all the systems noncontributory to HPI.   Objective: Vital Signs: BP (!) 156/71    Pulse 73    Ht 5\' 2"  (1.575 m)    Wt 112 lb (50.8 kg)    BMI 20.49 kg/m   Physical Exam Constitutional:      Appearance: She is well-developed.  HENT:     Head: Normocephalic.     Right Ear: External ear normal.     Left Ear: External ear normal. There is no impacted cerumen.  Eyes:     Pupils: Pupils are equal, round, and reactive to light.  Neck:     Thyroid: No  thyromegaly.     Trachea: No tracheal deviation.  Cardiovascular:     Rate and Rhythm: Normal rate.  Pulmonary:     Effort: Pulmonary effort is normal.  Abdominal:     Palpations: Abdomen is soft.  Musculoskeletal:     Cervical back: No rigidity.  Skin:    General: Skin is warm and dry.  Neurological:     Mental Status: She is alert and oriented to person, place, and time.  Psychiatric:        Behavior: Behavior normal.    Ortho Exam patient is amatory she has some discomfort when she turns and pivots.  Trace quad weakness on the left normal on the right.  Minimal tenderness over the trochanter.  Abductor strong anterior tib EHL is strong.  Specialty Comments:  No specialty comments available.  Imaging: Narrative & Impression  CLINICAL DATA:  Chronic low back pain.  Recent surgery in September.   EXAM: MRI LUMBAR SPINE WITHOUT AND WITH CONTRAST   TECHNIQUE: Multiplanar and multiecho pulse sequences of the lumbar spine were obtained without and with intravenous contrast.   CONTRAST:  75mL MULTIHANCE GADOBENATE DIMEGLUMINE 529  MG/ML IV SOLN   COMPARISON:  MRI lumbar spine dated Oct 23, 2020.   FINDINGS: Segmentation:  Standard.   Alignment:  Unchanged trace anterolisthesis at L5-S1.   Vertebrae:  No fracture, evidence of discitis, or bone lesion.   Conus medullaris and cauda equina: Conus extends to the L2 level. Conus and cauda equina appear normal. No intradural enhancement.   Paraspinal and other soft tissues: Unchanged small left adrenal adenoma.   Disc levels:   T12-L1:  No significant disc bulge or herniation.  No stenosis.   L1-L2: Interval left laminectomy. New left subarticular disc protrusion posteriorly displacing the descending left L2 nerve root. No stenosis.   L2-L3: Unchanged mild disc bulging and bilateral facet arthropathy. Unchanged mild left lateral recess stenosis. No spinal canal or neuroforaminal stenosis.   L3-L4: Unchanged mild disc  bulging and moderate left facet arthropathy unchanged mild bilateral lateral recess stenosis and mild-to-moderate left neuroforaminal stenosis. No spinal canal stenosis.   L4-L5: Unchanged mild disc bulging and bilateral facet arthropathy. Unchanged moderate right and mild left neuroforaminal stenosis. No spinal canal stenosis.   L5-S1: Unchanged mild disc bulging with severe right and moderate left facet arthropathy. Unchanged mild right neuroforaminal stenosis. No spinal canal or left neuroforaminal stenosis.   IMPRESSION: 1. Interval left laminectomy at L1-L2 with new left subarticular disc protrusion posteriorly displacing the descending left L2 nerve root. 2. Otherwise unchanged multilevel lumbar spondylosis as described above.     Electronically Signed   By: Titus Dubin M.D.   On: 04/26/2021 15:58       PMFS History: Patient Active Problem List   Diagnosis Date Noted   Lumbar post-laminectomy syndrome 06/15/2021   Status post lumbar microdiscectomy 03/09/2021   Recurrent herniation of lumbar disc 02/28/2021   Metatarsal stress fracture of left foot 02/10/2021   Herniated intervertebral disc of lumbar spine 01/26/2021   Foraminal stenosis due to intervertebral disc disease 01/21/2021   History of bladder cancer 01/21/2021   Labyrinthitis 12/08/2019   Left-sided low back pain without sciatica 05/05/2019   Visual loss, transient, bilateral 02/21/2019   Essential hypertension 02/21/2019   Colonic mass s/p lap ileocectomy 03/14/2018 03/14/2018   Acute lower GI bleeding 03/14/2018   Chronic anticoagulation 03/14/2018   Chronic tension-type headache, not intractable 05/31/2015   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 ([place]) 03/11/2014   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   RLS (restless legs syndrome) 07/04/2012   History of cerebral aneurysm 03/26/2009   Vitamin D deficiency 08/10/2008   Venous insufficiency 08/10/2008    Disorder of bone and cartilage 12/18/2007   History of acoustic neuroma 08/18/2007   Hypothyroidism 08/18/2007   Tobacco use disorder, continuous 08/18/2007   Mixed hyperlipidemia 07/18/2007   Anxiety with depression 07/18/2007   COPD (chronic obstructive pulmonary disease) with chronic bronchitis ([place]) 07/18/2007   Fibromyalgia 07/18/2007   Past Medical History:  Diagnosis Date   AN (acoustic neuroma) ([place])    Aneurysm ([place])    Anxiety    Bladder cancer ([place])    Cataract    Cerebrovascular disease    Cigarette smoker    COPD (chronic obstructive pulmonary disease) (HCC)    mild, no inhalers or oxygen used   Coronary artery disease    no current cardiologist  pt. denies   Deafness in right ear    Depression    Headache    tension headaches due to aneurysm repair with stent   Heart murmur    History of  kidney stones    15 to 20 yrs ago   Hyperlipidemia    Hypertension    Hypothyroidism    IBS (irritable bowel syndrome)    Mitral valve prolapse    mild takes beta blocker   Osteopenia    Palpitations    Stroke ([place])    x 3 ministrokes last one feb 2014   Venous insufficiency    Vitamin D deficiency     Family History  Problem Relation Age of Onset   Heart disease Mother    Cancer Mother        Lung   Heart attack Mother    Diabetes Mother    Cancer Father        Lung   Diabetes Sister    Cancer Sister        unkown type   Diabetes Brother    Lung cancer Brother        Lung   AAA (abdominal aortic aneurysm) Neg Hx    Colon cancer Neg Hx    Stomach cancer Neg Hx    Esophageal cancer Neg Hx    Liver cancer Neg Hx    Pancreatic cancer Neg Hx    Rectal cancer Neg Hx     Past Surgical History:  Procedure Laterality Date   ANEURYSM COILING  04/2009   Dr. Estanislado Pandy   APPENDECTOMY     BACK SURGERY  02/28/2021   BREAST LUMPECTOMY Right 1986   benign   DIAGNOSTIC LAPAROSCOPY     Proable laparoscopic right colectomy 03-14-18 Dr. Excell Seltzer   EYE SURGERY  Bilateral    ioc lens for cataracts   LAPAROSCOPIC RIGHT COLECTOMY Right 03/14/2018   Procedure: LAPAROSCOPIC ASSISTED  RIGHT COLECTOMY;  Surgeon: Excell Seltzer, MD;  [facility];  Service: General;  Laterality: Right;   LAPAROSCOPY N/A 03/14/2018   Procedure: LAPAROSCOPY;  Surgeon: Excell Seltzer, MD;  [facility];  Service: General;  Laterality: N/A;   LEFT HEART CATHETERIZATION WITH CORONARY ANGIOGRAM N/A 08/05/2014   Procedure: LEFT HEART CATHETERIZATION WITH CORONARY ANGIOGRAM;  Surgeon: Wellington Hampshire, MD;  Location: [facility];  Service: Cardiovascular;  Laterality: N/A;   LUMBAR LAMINECTOMY N/A 02/28/2021   Procedure: LEFT L1-L2 MICRODISCECTOMY AND FASCIECTOMY;  Surgeon: Marybelle Killings, MD;  Location: [facility];  Service: Orthopedics;  Laterality: N/A;   TONSILLECTOMY     TOTAL ABDOMINAL HYSTERECTOMY  1970   complete   TRANSLABYRINTHINE PROCEDURE  2002   [facility] Dr. Vicie Mutters tumor removal   TRANSURETHRAL RESECTION OF BLADDER TUMOR N/A 02/08/2018   Procedure: TRANSURETHRAL RESECTION OF BLADDER TUMOR (TURBT)/ POSTOPERATIVE INSTILLATION OF CHEMO THERAPY;  Surgeon: Festus Aloe, MD;  Location: [facility];  Service: Urology;  Laterality: N/A;   Social History   Occupational History   Occupation: alterations   Occupation: Retired    Comment: Regulatory affairs officer  Tobacco Use   Smoking status: Every Day    Packs/day: 0.25    Years: 50.00    Pack years: 12.50    Types: Cigarettes   Smokeless tobacco: Never   Tobacco comments:    4-5 cigs daily  is aware she needs to quit  Vaping Use   Vaping Use: Never used  Substance and Sexual Activity   Alcohol use: No    Alcohol/week: 0.0 standard drinks   Drug use: No   Sexual activity: Not Currently

## 2021-06-20 DIAGNOSIS — M5459 Other low back pain: Secondary | ICD-10-CM | POA: Diagnosis not present

## 2021-06-20 DIAGNOSIS — M5416 Radiculopathy, lumbar region: Secondary | ICD-10-CM | POA: Diagnosis not present

## 2021-06-22 DIAGNOSIS — M5459 Other low back pain: Secondary | ICD-10-CM | POA: Diagnosis not present

## 2021-06-22 DIAGNOSIS — M5416 Radiculopathy, lumbar region: Secondary | ICD-10-CM | POA: Diagnosis not present

## 2021-06-27 DIAGNOSIS — M5459 Other low back pain: Secondary | ICD-10-CM | POA: Diagnosis not present

## 2021-06-27 DIAGNOSIS — M5416 Radiculopathy, lumbar region: Secondary | ICD-10-CM | POA: Diagnosis not present

## 2021-06-29 DIAGNOSIS — M5459 Other low back pain: Secondary | ICD-10-CM | POA: Diagnosis not present

## 2021-06-29 DIAGNOSIS — M5416 Radiculopathy, lumbar region: Secondary | ICD-10-CM | POA: Diagnosis not present

## 2021-07-04 DIAGNOSIS — M5459 Other low back pain: Secondary | ICD-10-CM | POA: Diagnosis not present

## 2021-07-04 DIAGNOSIS — M5416 Radiculopathy, lumbar region: Secondary | ICD-10-CM | POA: Diagnosis not present

## 2021-07-06 ENCOUNTER — Telehealth: Payer: Self-pay | Admitting: Radiology

## 2021-07-06 ENCOUNTER — Ambulatory Visit (INDEPENDENT_AMBULATORY_CARE_PROVIDER_SITE_OTHER): Payer: Medicare Other | Admitting: Orthopaedic Surgery

## 2021-07-06 ENCOUNTER — Other Ambulatory Visit: Payer: Self-pay

## 2021-07-06 DIAGNOSIS — M5126 Other intervertebral disc displacement, lumbar region: Secondary | ICD-10-CM | POA: Diagnosis not present

## 2021-07-06 DIAGNOSIS — Z9889 Other specified postprocedural states: Secondary | ICD-10-CM

## 2021-07-06 DIAGNOSIS — G8929 Other chronic pain: Secondary | ICD-10-CM

## 2021-07-06 DIAGNOSIS — M5442 Lumbago with sciatica, left side: Secondary | ICD-10-CM

## 2021-07-06 MED ORDER — OXYCODONE-ACETAMINOPHEN 5-325 MG PO TABS: 1.0000 | ORAL_TABLET | Freq: Three times a day (TID) | ORAL | 0 refills | Status: DC | PRN

## 2021-07-06 NOTE — Telephone Encounter (Signed)
FYI Dr. Lorin Mercy is referring to Dr. Louanne Skye.  Patient requests early morning, but will be appreciative for whatever you are able to get her as soon as possible. Her pain is increasing after PT. Dr. Lorin Mercy is questioning whether fusion will help her. Thanks.

## 2021-07-06 NOTE — Progress Notes (Signed)
Office Visit Note   Patient: Ana Santos           Date of Birth: 1940/08/22           MRN: [id number] Visit Date: 07/06/2021              Requested by: Haydee Salter, MD [place],  [place] [address] PCP: Haydee Salter, MD   Assessment & Plan: Visit Diagnoses:  1. Status post lumbar microdiscectomy     Plan: Patient failed to get better with therapy TENS unit, epidural steroid injection really did not help at all.  I like to have her see Dr. Louanne Skye I discussed with her I am not unsure of the fusion will help her pain.  She had been on tramadol for many years then stopped and has been using the Percocet sparingly.  Other option would be continued intermittent chronic pain medication.  Patient has been agreement to see Dr. Louanne Skye for his opinion .  I made her follow-up appointment with me in 3 months.  Follow-Up Instructions: Return in about 3 months (around 10/04/2021).   Orders:  No orders of the defined types were placed in this encounter.  Meds ordered this encounter  Medications   oxyCODONE-acetaminophen (PERCOCET/ROXICET) 5-325 MG tablet    Sig: Take 1-2 tablets by mouth every 8 (eight) hours as needed for severe pain.    Dispense:  30 tablet    Refill:  0    Post op recurrent disc herniation      Procedures: No procedures performed   Clinical Data: No additional findings.   Subjective: Chief Complaint  Patient presents with   Lower Back - Follow-up    HPI 81 year old female returns with ongoing problems with back pain and left for due to hip pain.  She had a left L1-2 microdiscectomy with facetectomy 02/28/2021.  She has been on tramadol 1 p.o. once twice a day for about 15 years after cerebral aneurysm surgery.  She came off the Ultram had withdrawal symptoms.  First saw her a year after that with increased problems with back pain left leg pain and MRI showed some disc extrusion small but some compression.  After surgery she was  better for a few weeks and had recurrence of symptoms.  Last MRI scan 04/26/2021 showed postop laminectomy with subarticular disc protrusion displacing descending left L2 nerve root.  She has been using Percocet sometimes taking half a tablet on some days sometimes a whole tablet.  She still has 5 or 6 tablets from 06/10/2021 prescription of 30 tablets.  Patient states she really does not want to keep taking narcotic medicine chronically.  Recent ESI she states did not give her any benefit.  Patient been through recent TENS unit, physical therapy and states it is really not helping her in someway she thinks it may be making her worse.  She has been on Neurontin 100 mg twice daily.  She has not had a myelogram CT scan and has not been on Lyrica.  Review of Systems all other systems noncontributory.   Objective: Vital Signs: There were no vitals taken for this visit.  Physical Exam Constitutional:      Appearance: She is well-developed.  HENT:     Head: Normocephalic.     Right Ear: External ear normal.     Left Ear: External ear normal. There is no impacted cerumen.  Eyes:     Pupils: Pupils are equal, round, and reactive to  light.  Neck:     Thyroid: No thyromegaly.     Trachea: No tracheal deviation.  Cardiovascular:     Rate and Rhythm: Normal rate.  Pulmonary:     Effort: Pulmonary effort is normal.  Abdominal:     Palpations: Abdomen is soft.  Musculoskeletal:     Cervical back: No rigidity.  Skin:    General: Skin is warm and dry.  Neurological:     Mental Status: She is alert and oriented to person, place, and time.  Psychiatric:        Behavior: Behavior normal.    Ortho Exam patient ambulates with slight limp.  No quad or hip flexion weakness.  No L2 numbness left side or right side.  Specialty Comments:  No specialty comments available.  Imaging: Narrative & Impression  CLINICAL DATA:  Chronic low back pain.  Recent surgery in September.   EXAM: MRI LUMBAR SPINE  WITHOUT AND WITH CONTRAST   TECHNIQUE: Multiplanar and multiecho pulse sequences of the lumbar spine were obtained without and with intravenous contrast.   CONTRAST:  54mL MULTIHANCE GADOBENATE DIMEGLUMINE 529 MG/ML IV SOLN   COMPARISON:  MRI lumbar spine dated Oct 23, 2020.   FINDINGS: Segmentation:  Standard.   Alignment:  Unchanged trace anterolisthesis at L5-S1.   Vertebrae:  No fracture, evidence of discitis, or bone lesion.   Conus medullaris and cauda equina: Conus extends to the L2 level. Conus and cauda equina appear normal. No intradural enhancement.   Paraspinal and other soft tissues: Unchanged small left adrenal adenoma.   Disc levels:   T12-L1:  No significant disc bulge or herniation.  No stenosis.   L1-L2: Interval left laminectomy. New left subarticular disc protrusion posteriorly displacing the descending left L2 nerve root. No stenosis.   L2-L3: Unchanged mild disc bulging and bilateral facet arthropathy. Unchanged mild left lateral recess stenosis. No spinal canal or neuroforaminal stenosis.   L3-L4: Unchanged mild disc bulging and moderate left facet arthropathy unchanged mild bilateral lateral recess stenosis and mild-to-moderate left neuroforaminal stenosis. No spinal canal stenosis.   L4-L5: Unchanged mild disc bulging and bilateral facet arthropathy. Unchanged moderate right and mild left neuroforaminal stenosis. No spinal canal stenosis.   L5-S1: Unchanged mild disc bulging with severe right and moderate left facet arthropathy. Unchanged mild right neuroforaminal stenosis. No spinal canal or left neuroforaminal stenosis.   IMPRESSION: 1. Interval left laminectomy at L1-L2 with new left subarticular disc protrusion posteriorly displacing the descending left L2 nerve root. 2. Otherwise unchanged multilevel lumbar spondylosis as described above.     Electronically Signed   By: Titus Dubin M.D.   On: 04/26/2021 15:58         PMFS History: Patient Active Problem List   Diagnosis Date Noted   Lumbar post-laminectomy syndrome 06/15/2021   Status post lumbar microdiscectomy 03/09/2021   Recurrent herniation of lumbar disc 02/28/2021   Metatarsal stress fracture of left foot 02/10/2021   Herniated intervertebral disc of lumbar spine 01/26/2021   Foraminal stenosis due to intervertebral disc disease 01/21/2021   History of bladder cancer 01/21/2021   Labyrinthitis 12/08/2019   Left-sided low back pain without sciatica 05/05/2019   Visual loss, transient, bilateral 02/21/2019   Essential hypertension 02/21/2019   Colonic mass s/p lap ileocectomy 03/14/2018 03/14/2018   Acute lower GI bleeding 03/14/2018   Chronic anticoagulation 03/14/2018   Chronic tension-type headache, not intractable 05/31/2015   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 ([place]) 03/11/2014   Aneurysm  of basilar artery (HCC) 08/20/2013   RLS (restless legs syndrome) 07/04/2012   History of cerebral aneurysm 03/26/2009   Vitamin D deficiency 08/10/2008   Venous insufficiency 08/10/2008   Disorder of bone and cartilage 12/18/2007   History of acoustic neuroma 08/18/2007   Hypothyroidism 08/18/2007   Tobacco use disorder, continuous 08/18/2007   Mixed hyperlipidemia 07/18/2007   Anxiety with depression 07/18/2007   COPD (chronic obstructive pulmonary disease) with chronic bronchitis ([place]) 07/18/2007   Fibromyalgia 07/18/2007   Past Medical History:  Diagnosis Date   AN (acoustic neuroma) (HCC)    Aneurysm (HCC)    Anxiety    Bladder cancer ([place])    Cataract    Cerebrovascular disease    Cigarette smoker    COPD (chronic obstructive pulmonary disease) (HCC)    mild, no inhalers or oxygen used   Coronary artery disease    no current cardiologist  pt. denies   Deafness in right ear    Depression    Headache    tension headaches due to aneurysm repair with stent   Heart murmur    History of  kidney stones    15 to 20 yrs ago   Hyperlipidemia    Hypertension    Hypothyroidism    IBS (irritable bowel syndrome)    Mitral valve prolapse    mild takes beta blocker   Osteopenia    Palpitations    Stroke ([place])    x 3 ministrokes last one feb 2014   Venous insufficiency    Vitamin D deficiency     Family History  Problem Relation Age of Onset   Heart disease Mother    Cancer Mother        Lung   Heart attack Mother    Diabetes Mother    Cancer Father        Lung   Diabetes Sister    Cancer Sister        unkown type   Diabetes Brother    Lung cancer Brother        Lung   AAA (abdominal aortic aneurysm) Neg Hx    Colon cancer Neg Hx    Stomach cancer Neg Hx    Esophageal cancer Neg Hx    Liver cancer Neg Hx    Pancreatic cancer Neg Hx    Rectal cancer Neg Hx     Past Surgical History:  Procedure Laterality Date   ANEURYSM COILING  04/2009   Dr. Estanislado Pandy   APPENDECTOMY     BACK SURGERY  02/28/2021   BREAST LUMPECTOMY Right 1986   benign   DIAGNOSTIC LAPAROSCOPY     Proable laparoscopic right colectomy 03-14-18 Dr. Excell Seltzer   EYE SURGERY Bilateral    ioc lens for cataracts   LAPAROSCOPIC RIGHT COLECTOMY Right 03/14/2018   Procedure: LAPAROSCOPIC ASSISTED  RIGHT COLECTOMY;  Surgeon: Excell Seltzer, MD;  Location: [facility];  Service: General;  Laterality: Right;   LAPAROSCOPY N/A 03/14/2018   Procedure: LAPAROSCOPY;  Surgeon: Excell Seltzer, MD;  Location: [facility];  Service: General;  Laterality: N/A;   LEFT HEART CATHETERIZATION WITH CORONARY ANGIOGRAM N/A 08/05/2014   Procedure: LEFT HEART CATHETERIZATION WITH CORONARY ANGIOGRAM;  Surgeon: Wellington Hampshire, MD;  Location: [facility];  Service: Cardiovascular;  Laterality: N/A;   LUMBAR LAMINECTOMY N/A 02/28/2021   Procedure: LEFT L1-L2 MICRODISCECTOMY AND FASCIECTOMY;  Surgeon: Marybelle Killings, MD;  Location: [facility];  Service: Orthopedics;  Laterality: N/A;   TONSILLECTOMY  TOTAL ABDOMINAL  HYSTERECTOMY  1970   complete   TRANSLABYRINTHINE PROCEDURE  2002   [facility] Dr. Vicie Mutters tumor removal   TRANSURETHRAL RESECTION OF BLADDER TUMOR N/A 02/08/2018   Procedure: TRANSURETHRAL RESECTION OF BLADDER TUMOR (TURBT)/ POSTOPERATIVE INSTILLATION OF CHEMO THERAPY;  Surgeon: Festus Aloe, MD;  Location[facility];  Service: Urology;  Laterality: N/A;   Social History   Occupational History   Occupation: alterations   Occupation: Retired    Comment: Regulatory affairs officer  Tobacco Use   Smoking status: Every Day    Packs/day: 0.25    Years: 50.00    Pack years: 12.50    Types: Cigarettes   Smokeless tobacco: Never   Tobacco comments:    4-5 cigs daily  is aware she needs to quit  Vaping Use   Vaping Use: Never used  Substance and Sexual Activity   Alcohol use: No    Alcohol/week: 0.0 standard drinks   Drug use: No   Sexual activity: Not Currently

## 2021-07-06 NOTE — Telephone Encounter (Signed)
07/18/21 @ 9 °

## 2021-07-18 ENCOUNTER — Ambulatory Visit (INDEPENDENT_AMBULATORY_CARE_PROVIDER_SITE_OTHER): Payer: Medicare Other

## 2021-07-18 ENCOUNTER — Ambulatory Visit (INDEPENDENT_AMBULATORY_CARE_PROVIDER_SITE_OTHER): Payer: Medicare Other | Admitting: Specialist

## 2021-07-18 ENCOUNTER — Encounter: Payer: Self-pay | Admitting: Specialist

## 2021-07-18 ENCOUNTER — Other Ambulatory Visit: Payer: Self-pay

## 2021-07-18 VITALS — BP 135/70 | HR 67 | Ht 62.0 in | Wt 112.0 lb

## 2021-07-18 DIAGNOSIS — Z9889 Other specified postprocedural states: Secondary | ICD-10-CM

## 2021-07-18 DIAGNOSIS — M5136 Other intervertebral disc degeneration, lumbar region: Secondary | ICD-10-CM | POA: Diagnosis not present

## 2021-07-18 DIAGNOSIS — M5417 Radiculopathy, lumbosacral region: Secondary | ICD-10-CM | POA: Diagnosis not present

## 2021-07-18 DIAGNOSIS — M542 Cervicalgia: Secondary | ICD-10-CM

## 2021-07-18 DIAGNOSIS — M4156 Other secondary scoliosis, lumbar region: Secondary | ICD-10-CM | POA: Diagnosis not present

## 2021-07-18 DIAGNOSIS — M5126 Other intervertebral disc displacement, lumbar region: Secondary | ICD-10-CM | POA: Diagnosis not present

## 2021-07-18 DIAGNOSIS — M47812 Spondylosis without myelopathy or radiculopathy, cervical region: Secondary | ICD-10-CM

## 2021-07-18 MED ORDER — GABAPENTIN 100 MG PO CAPS: ORAL_CAPSULE | ORAL | 2 refills | Status: DC

## 2021-07-18 NOTE — Patient Instructions (Signed)
Plan: Avoid bending, stooping and avoid lifting weights greater than 10 lbs. Avoid prolong standing and walking. Order for a new walker with wheels. Surgery scheduling secretary Kandice Hams, will call you in the next week to schedule for surgery.  Surgery recommended is a one level lumbar fusion L1-2 this would be done with rods, screws and cages with local bone graft and allograft (donor bone graft). Take hydrocodone for for pain. Risk of surgery includes risk of infection 1 in 200 patients, bleeding 1/2% chance you would need a transfusion.   Risk to the nerves is one in 10,000. You will need to use a brace for 3 months and wean from the brace on the 4th month. Expect improved walking and standing tolerance. Expect relief of leg pain but numbness may persist depending on the length and degree of pressure that has been present.

## 2021-07-18 NOTE — Progress Notes (Signed)
Office Visit Note   Patient: Ana Santos           Date of Birth: 12/16/1940           MRN: [id number] Visit Date: 07/18/2021              Requested by: Marybelle Killings, MD [place],  [place] [address] PCP: Haydee Salter, MD   Assessment & Plan: Visit Diagnoses:  1. Status post lumbar microdiscectomy   2. Recurrent herniation of lumbar disc   3. Other intervertebral disc degeneration, lumbar region   4. Cervicalgia   5. Spondylosis without myelopathy or radiculopathy, cervical region     Plan: Avoid bending, stooping and avoid lifting weights greater than 10 lbs. Avoid prolong standing and walking. Order for a new walker with wheels. Surgery scheduling secretary Kandice Hams, will call you in the next week to schedule for surgery.  Surgery recommended is a one level lumbar fusion L1-2 this would be done with rods, screws and cages with local bone graft and allograft (donor bone graft). Take hydrocodone for for pain. Risk of surgery includes risk of infection 1 in 200 patients, bleeding 1/2% chance you would need a transfusion.   Risk to the nerves is one in 10,000. You will need to use a brace for 3 months and wean from the brace on the 4th month. Expect improved walking and standing tolerance. Expect relief of leg pain but numbness may persist depending on the length and degree of pressure that has been present.    Follow-Up Instructions: Return in about 4 weeks (around 08/15/2021).   Orders:  Orders Placed This Encounter  Procedures   XR Lumbar Spine 2-3 Views   Meds ordered this encounter  Medications   gabapentin (NEURONTIN) 100 MG capsule    Sig: Take 1 capsule (100 mg total) by mouth 2 (two) times daily AND 2 capsules (200 mg total) at bedtime. Take  2 po q HS.    Dispense:  120 capsule    Refill:  2      Procedures: No procedures performed   Clinical Data: Findings:  CLINICAL DATA:  Chronic low back pain.  Recent surgery in  September.  EXAM: MRI LUMBAR SPINE WITHOUT AND WITH CONTRAST  TECHNIQUE: Multiplanar and multiecho pulse sequences of the lumbar spine were obtained without and with intravenous contrast.  CONTRAST:  80mL MULTIHANCE GADOBENATE DIMEGLUMINE 529 MG/ML IV SOLN  COMPARISON:  MRI lumbar spine dated Oct 23, 2020.  FINDINGS: Segmentation:  Standard.  Alignment:  Unchanged trace anterolisthesis at L5-S1.  Vertebrae:  No fracture, evidence of discitis, or bone lesion.  Conus medullaris and cauda equina: Conus extends to the L2 level. Conus and cauda equina appear normal. No intradural enhancement.  Paraspinal and other soft tissues: Unchanged small left adrenal adenoma.  Disc levels:  T12-L1:  No significant disc bulge or herniation.  No stenosis.  L1-L2: Interval left laminectomy. New left subarticular disc protrusion posteriorly displacing the descending left L2 nerve root. No stenosis.  L2-L3: Unchanged mild disc bulging and bilateral facet arthropathy. Unchanged mild left lateral recess stenosis. No spinal canal or neuroforaminal stenosis.  L3-L4: Unchanged mild disc bulging and moderate left facet arthropathy unchanged mild bilateral lateral recess stenosis and mild-to-moderate left neuroforaminal stenosis. No spinal canal stenosis.  L4-L5: Unchanged mild disc bulging and bilateral facet arthropathy. Unchanged moderate right and mild left neuroforaminal stenosis. No spinal canal stenosis.  L5-S1: Unchanged mild disc bulging with severe right and  moderate left facet arthropathy. Unchanged mild right neuroforaminal stenosis. No spinal canal or left neuroforaminal stenosis.  IMPRESSION: 1. Interval left laminectomy at L1-L2 with new left subarticular disc protrusion posteriorly displacing the descending left L2 nerve root. 2. Otherwise unchanged multilevel lumbar spondylosis as described above.   Electronically Signed   By: Titus Dubin M.D.    On: 04/26/2021 15:58    Subjective: Chief Complaint  Patient presents with   Lower Back - Pain    81 year old female with 18 mo history of back pain with radiation into the left upper lumbar and left buttock. Pain is present constantly and it is severe sharp and sometimes not as bad as others. She fell about 02/2020 standing on a chair watering flowers the chair gave away and landed flat on her back. About a week later the pain started. No bowel or bladder change. She is hurting and went about a year before undergoing left L1-2 microdiscectomy with Dr. Lorin Mercy. She had no relief of pain except when on the pain meds. Stopping the pain meds her pain recurred. She has pain in the upper left lumbar spine that radiates upward to her shoulder blades and the pain down the left lumbar to the left buttock. There is groin pain. It is difficult to start to walk and she is able to grocery shop with a cart to lean on. She is losing weight and has pain even with lying down. It awakens her with turning over. Twisting to the left is painful. Has some accidents with bowel and bladder. Presently taking oxycodone for pain. Prior to the fall she was able to work in her yard all day. She has had repeat ESI post op and therapy without relief of pain. Reports pain is worsening and she is requiring oxycontin to relieve pain.    Review of Systems  Constitutional:  Positive for unexpected weight change. Negative for activity change, appetite change, chills, diaphoresis, fatigue and fever.  HENT:  Positive for hearing loss (removal of acoustic tumor). Negative for congestion, dental problem, drooling, ear discharge, ear pain, facial swelling, mouth sores, nosebleeds, rhinorrhea, sinus pressure, sinus pain, sneezing, sore throat, tinnitus, trouble swallowing and voice change.   Eyes: Negative.  Negative for photophobia, pain, discharge, redness, itching and visual disturbance.  Respiratory: Negative.  Negative for apnea,  cough, choking, chest tightness, shortness of breath, wheezing and stridor.   Cardiovascular: Negative.  Negative for chest pain, palpitations and leg swelling.  Gastrointestinal: Negative.  Negative for abdominal distention, abdominal pain, anal bleeding, blood in stool, constipation, diarrhea and nausea.  Endocrine: Negative.   Genitourinary: Negative.  Negative for difficulty urinating (history of bladder ca), dyspareunia, dysuria, enuresis, flank pain, frequency and genital sores.  Musculoskeletal:  Positive for back pain and gait problem. Negative for arthralgias, joint swelling, myalgias, neck pain and neck stiffness.  Skin: Negative.  Negative for color change, pallor, rash and wound.  Allergic/Immunologic: Negative.  Negative for environmental allergies, food allergies and immunocompromised state.  Neurological:  Positive for weakness and numbness (Left anterior thigh). Negative for dizziness, tremors, seizures, syncope, facial asymmetry, speech difficulty, light-headedness and headaches.  Hematological: Negative.  Negative for adenopathy. Does not bruise/bleed easily.  Psychiatric/Behavioral: Negative.  Negative for agitation, behavioral problems, confusion, decreased concentration, dysphoric mood, hallucinations, self-injury, sleep disturbance and suicidal ideas. The patient is not nervous/anxious and is not hyperactive.     Objective: Vital Signs: BP 135/70 (BP Location: Left Arm, Patient Position: Sitting)    Pulse 67  Ht 5\' 2"  (1.575 m)    Wt 112 lb (50.8 kg)    BMI 20.49 kg/m   Physical Exam Constitutional:      Appearance: She is well-developed.  HENT:     Head: Normocephalic and atraumatic.  Eyes:     Pupils: Pupils are equal, round, and reactive to light.  Pulmonary:     Effort: Pulmonary effort is normal.     Breath sounds: Normal breath sounds.  Abdominal:     General: Bowel sounds are normal.     Palpations: Abdomen is soft.  Musculoskeletal:     Cervical back:  Normal range of motion and neck supple.     Lumbar back: Negative right straight leg raise test and negative left straight leg raise test.  Skin:    General: Skin is warm and dry.  Neurological:     Mental Status: She is alert and oriented to person, place, and time.  Psychiatric:        Behavior: Behavior normal.        Thought Content: Thought content normal.        Judgment: Judgment normal.    Back Exam   Tenderness  The patient is experiencing tenderness in the thoracic, lumbar and cervical.  Range of Motion  Extension:  30 abnormal  Flexion:  30 abnormal  Lateral bend right:  abnormal  Lateral bend left:  abnormal  Rotation right:  abnormal  Rotation left:  abnormal   Muscle Strength  The patient has normal back strength. Right Quadriceps:  5/5  Left Quadriceps:  5/5  Right Hamstrings:  5/5  Left Hamstrings:  5/5   Tests  Straight leg raise right: negative Straight leg raise left: negative  Reflexes  Patellar:  2/4 Achilles:  2/4 Biceps:  3/4 Babinski's sign: normal   Other  Toe walk: normal Heel walk: normal Sensation: normal Gait: normal   Comments:  Stooped gait and in standing right side list. Positive reverse SLR left. Weak left hip flexion 4/5. Minimal right hip flexion weakness 5-/5 Knee extension 5/5, reflexes knee and ankle intact and symmetric.      Specialty Comments:  No specialty comments available.  Imaging: No results found.   PMFS History: Patient Active Problem List   Diagnosis Date Noted   Lumbar post-laminectomy syndrome 06/15/2021   Status post lumbar microdiscectomy 03/09/2021   Recurrent herniation of lumbar disc 02/28/2021   Metatarsal stress fracture of left foot 02/10/2021   Herniated intervertebral disc of lumbar spine 01/26/2021   Foraminal stenosis due to intervertebral disc disease 01/21/2021   History of bladder cancer 01/21/2021   Labyrinthitis 12/08/2019   Left-sided low back pain without sciatica  05/05/2019   Visual loss, transient, bilateral 02/21/2019   Essential hypertension 02/21/2019   Colonic mass s/p lap ileocectomy 03/14/2018 03/14/2018   Acute lower GI bleeding 03/14/2018   Chronic anticoagulation 03/14/2018   Chronic tension-type headache, not intractable 05/31/2015   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 ([place]) 03/11/2014   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   RLS (restless legs syndrome) 07/04/2012   History of cerebral aneurysm 03/26/2009   Vitamin D deficiency 08/10/2008   Venous insufficiency 08/10/2008   Disorder of bone and cartilage 12/18/2007   History of acoustic neuroma 08/18/2007   Hypothyroidism 08/18/2007   Tobacco use disorder, continuous 08/18/2007   Mixed hyperlipidemia 07/18/2007   Anxiety with depression 07/18/2007   COPD (chronic obstructive pulmonary disease) with chronic bronchitis ([place]) 07/18/2007   Fibromyalgia  07/18/2007   Past Medical History:  Diagnosis Date   AN (acoustic neuroma) ([place])    Aneurysm ([facility])    Anxiety    Bladder cancer ([place])    Cataract    Cerebrovascular disease    Cigarette smoker    COPD (chronic obstructive pulmonary disease) ([facility])    mild, no inhalers or oxygen used   Coronary artery disease    no current cardiologist  pt. denies   Deafness in right ear    Depression    Headache    tension headaches due to aneurysm repair with stent   Heart murmur    History of kidney stones    15 to 20 yrs ago   Hyperlipidemia    Hypertension    Hypothyroidism    IBS (irritable bowel syndrome)    Mitral valve prolapse    mild takes beta blocker   Osteopenia    Palpitations    Stroke ([place])    x 3 ministrokes last one feb 2014   Venous insufficiency    Vitamin D deficiency     Family History  Problem Relation Age of Onset   Heart disease Mother    Cancer Mother        Lung   Heart attack Mother    Diabetes Mother     Cancer Father        Lung   Diabetes Sister    Cancer Sister        unkown type   Diabetes Brother    Lung cancer Brother        Lung   AAA (abdominal aortic aneurysm) Neg Hx    Colon cancer Neg Hx    Stomach cancer Neg Hx    Esophageal cancer Neg Hx    Liver cancer Neg Hx    Pancreatic cancer Neg Hx    Rectal cancer Neg Hx     Past Surgical History:  Procedure Laterality Date   ANEURYSM COILING  04/2009   Dr. Estanislado Pandy   APPENDECTOMY     BACK SURGERY  02/28/2021   BREAST LUMPECTOMY Right 1986   benign   DIAGNOSTIC LAPAROSCOPY     Proable laparoscopic right colectomy 03-14-18 Dr. Excell Seltzer   EYE SURGERY Bilateral    ioc lens for cataracts   LAPAROSCOPIC RIGHT COLECTOMY Right 03/14/2018   Procedure: LAPAROSCOPIC ASSISTED  RIGHT COLECTOMY;  Surgeon: Excell Seltzer, MD;  Location: [facility];  Service: General;  Laterality: Right;   LAPAROSCOPY N/A 03/14/2018   Procedure: LAPAROSCOPY;  Surgeon: Excell Seltzer, MD;  Location: [facility];  Service: General;  Laterality: N/A;   LEFT HEART CATHETERIZATION WITH CORONARY ANGIOGRAM N/A 08/05/2014   Procedure: LEFT HEART CATHETERIZATION WITH CORONARY ANGIOGRAM;  Surgeon: Wellington Hampshire, MD;  Location: [facility];  Service: Cardiovascular;  Laterality: N/A;   LUMBAR LAMINECTOMY N/A 02/28/2021   Procedure: LEFT L1-L2 MICRODISCECTOMY AND FASCIECTOMY;  Surgeon: Marybelle Killings, MD;  Location: [facility];  Service: Orthopedics;  Laterality: N/A;   TONSILLECTOMY     TOTAL ABDOMINAL HYSTERECTOMY  1970   complete   TRANSLABYRINTHINE PROCEDURE  2002   [facility] Dr. Vicie Mutters tumor removal   TRANSURETHRAL RESECTION OF BLADDER TUMOR N/A 02/08/2018   Procedure: TRANSURETHRAL RESECTION OF BLADDER TUMOR (TURBT)/ POSTOPERATIVE INSTILLATION OF CHEMO THERAPY;  Surgeon: Festus Aloe, MD;  Location: [facility];  Service: Urology;  Laterality: N/A;   Social History   Occupational History   Occupation: alterations    Occupation: Retired    Comment:  Seamstress  Tobacco Use   Smoking status: Every Day    Packs/day: 0.25    Years: 50.00    Pack years: 12.50    Types: Cigarettes   Smokeless tobacco: Never   Tobacco comments:    4-5 cigs daily  is aware she needs to quit  Vaping Use   Vaping Use: Never used  Substance and Sexual Activity   Alcohol use: No    Alcohol/week: 0.0 standard drinks   Drug use: No   Sexual activity: Not Currently

## 2021-07-25 ENCOUNTER — Other Ambulatory Visit: Payer: Self-pay | Admitting: Family Medicine

## 2021-07-25 DIAGNOSIS — I10 Essential (primary) hypertension: Secondary | ICD-10-CM

## 2021-07-27 ENCOUNTER — Telehealth: Payer: Self-pay | Admitting: Family Medicine

## 2021-07-27 NOTE — Telephone Encounter (Signed)
Spoke to patient advised that we haven't received any surgical clearance form. She will call them tomorrow and give them our fax number.  Dm/cma

## 2021-07-27 NOTE — Telephone Encounter (Signed)
Pt is checking on status of a surgical clearance form Dr. Flavia Shipper has sent over from [facility]. They are not able to schedule her back surgery until they get this back.

## 2021-07-28 NOTE — Telephone Encounter (Signed)
Received a Surgical Clearance form from Dr Jacqualin Combes for L!-2 fusion.  She has up coming appt on 08/03/21 @ 8:30 am.  But wants to know if you will sign this and get it back to them before that so she can get her surgery before the end of the month when the Doctor is going on Vacation for 1 month.    Please review form on desk and advise.  Thanks. Dm/cma

## 2021-07-29 NOTE — Telephone Encounter (Signed)
Patient notified VIA phone that form was faxe to [facility] @ [phone] Attn: [place].  Dm/cma

## 2021-08-02 ENCOUNTER — Other Ambulatory Visit: Payer: Self-pay

## 2021-08-03 ENCOUNTER — Ambulatory Visit (INDEPENDENT_AMBULATORY_CARE_PROVIDER_SITE_OTHER): Payer: Medicare Other | Admitting: Family Medicine

## 2021-08-03 VITALS — BP 130/72 | HR 60 | Temp 97.3°F | Ht 62.0 in | Wt 113.2 lb

## 2021-08-03 DIAGNOSIS — F418 Other specified anxiety disorders: Secondary | ICD-10-CM | POA: Diagnosis not present

## 2021-08-03 DIAGNOSIS — I10 Essential (primary) hypertension: Secondary | ICD-10-CM | POA: Diagnosis not present

## 2021-08-03 DIAGNOSIS — M5126 Other intervertebral disc displacement, lumbar region: Secondary | ICD-10-CM | POA: Diagnosis not present

## 2021-08-03 DIAGNOSIS — E039 Hypothyroidism, unspecified: Secondary | ICD-10-CM | POA: Diagnosis not present

## 2021-08-03 DIAGNOSIS — Z8639 Personal history of other endocrine, nutritional and metabolic disease: Secondary | ICD-10-CM

## 2021-08-03 DIAGNOSIS — J449 Chronic obstructive pulmonary disease, unspecified: Secondary | ICD-10-CM

## 2021-08-03 DIAGNOSIS — Z9889 Other specified postprocedural states: Secondary | ICD-10-CM | POA: Diagnosis not present

## 2021-08-03 LAB — CBC
HCT: 36.9 % (ref 36.0–46.0)
Hemoglobin: 11.7 g/dL — ABNORMAL LOW (ref 12.0–15.0)
MCHC: 31.8 g/dL (ref 30.0–36.0)
MCV: 87.7 fl (ref 78.0–100.0)
Platelets: 280 10*3/uL (ref 150.0–400.0)
RBC: 4.21 Mil/uL (ref 3.87–5.11)
RDW: 14.9 % (ref 11.5–15.5)
WBC: 10 10*3/uL (ref 4.0–10.5)

## 2021-08-03 LAB — BASIC METABOLIC PANEL
BUN: 23 mg/dL (ref 6–23)
CO2: 25 mEq/L (ref 19–32)
Calcium: 9.8 mg/dL (ref 8.4–10.5)
Chloride: 106 mEq/L (ref 96–112)
Creatinine, Ser: 1.03 mg/dL (ref 0.40–1.20)
GFR: 51.33 mL/min — ABNORMAL LOW (ref >60.00)
Glucose, Bld: 86 mg/dL (ref 70–99)
Potassium: 4.2 mEq/L (ref 3.5–5.1)
Sodium: 137 mEq/L (ref 135–145)

## 2021-08-03 LAB — TSH: TSH: 0.08 u[IU]/mL — ABNORMAL LOW (ref 0.35–5.50)

## 2021-08-03 LAB — T4, FREE: Free T4: 1.16 ng/dL (ref 0.60–1.60)

## 2021-08-03 MED ORDER — SERTRALINE HCL 50 MG PO TABS: 50.0000 mg | ORAL_TABLET | Freq: Every day | ORAL | 3 refills | Status: DC

## 2021-08-03 NOTE — Progress Notes (Signed)
[facility] [address] [place] [address] Dept: [phone] Dept Fax: [phone]  Chronic Care Office Visit  Subjective:    Patient ID: Ana Santos, female    DOB: 04/03/1941, 81 y.o..   MRN: [id number]  Chief Complaint  Patient presents with   Follow-up    3 month f/u.      History of Present Illness:  Patient is in today for reassessment of chronic medical issues.  Ana Santos has a history of hypertension, managed with amlodipine. She also has prior mitral valve disease and PVS. She also has hyperlipidemia, which is managed with pravastatin.   Ana Santos has a history of hypothyroidism. She is managed on levothyroxine. Her last TSH was slightly low after a dosage adjustment.    Ana Santos has a history of COPD. She only occasionally use an albuterol inhaler for this. She continues to smoke a small number of cigarettes daily.    Ana Santos has a history of a fall in Sept. 2021. An MRI scan in may showed multi-level degenerative lumbar disease with foraminal stenoses. She had multiple ESIs without improvement. She had a left L1-L2 microdiscectomy on 02/28/2021. Her pain improved temporarily, but now has returned and feels no different than before. She is now seeing Dr. Louanne Skye, who plans a repeat surgery for recurrent disc herniation s/p microdiscectomy. She is being managed on Percocet (one a day) and gabapentin 100 mg (1 q am, 1 q pm, 2 qhs).   Past Medical History: Patient Active Problem List   Diagnosis Date Noted   Lumbar post-laminectomy syndrome 06/15/2021   Status post lumbar microdiscectomy 03/09/2021   Recurrent herniation of lumbar disc 02/28/2021   Metatarsal stress fracture of left foot 02/10/2021   Herniated intervertebral disc of lumbar spine 01/26/2021   Foraminal stenosis due to intervertebral disc disease 01/21/2021   History of bladder cancer 01/21/2021   Labyrinthitis 12/08/2019    Left-sided low back pain without sciatica 05/05/2019   Visual loss, transient, bilateral 02/21/2019   Essential hypertension 02/21/2019   Colonic mass s/p lap ileocectomy 03/14/2018 03/14/2018   Acute lower GI bleeding 03/14/2018   Chronic anticoagulation 03/14/2018   Chronic tension-type headache, not intractable 05/31/2015   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 ([facility]) 03/11/2014   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 Estanislado Pandy   APPENDECTOMY     BACK SURGERY  02/28/2021   BREAST LUMPECTOMY Right 1986   benign   DIAGNOSTIC LAPAROSCOPY     Proable laparoscopic right colectomy 03-14-18 Dr. Excell Seltzer   EYE SURGERY Bilateral    ioc lens for cataracts   LAPAROSCOPIC RIGHT COLECTOMY Right 03/14/2018   Procedure: LAPAROSCOPIC ASSISTED  RIGHT COLECTOMY;  Surgeon: Excell Seltzer, MD;  Location: [facility];  Service: General;  Laterality: Right;   LAPAROSCOPY N/A 03/14/2018   Procedure: LAPAROSCOPY;  Surgeon: Excell Seltzer, MD;  Location: [facility];  Service: General;  Laterality:  N/A;   LEFT HEART CATHETERIZATION WITH CORONARY ANGIOGRAM N/A 08/05/2014   Procedure: LEFT HEART CATHETERIZATION WITH CORONARY ANGIOGRAM;  Surgeon: Wellington Hampshire, MD;  Location: [facility];  Service: Cardiovascular;  Laterality: N/A;   LUMBAR LAMINECTOMY N/A 02/28/2021   Procedure: LEFT L1-L2  MICRODISCECTOMY AND FASCIECTOMY;  Surgeon: Marybelle Killings, MD;  Location: [facility];  Service: Orthopedics;  Laterality: N/A;   TONSILLECTOMY     TOTAL ABDOMINAL HYSTERECTOMY  1970   complete   TRANSLABYRINTHINE PROCEDURE  2002   [facility] Dr. Vicie Mutters tumor removal   TRANSURETHRAL RESECTION OF BLADDER TUMOR N/A 02/08/2018   Procedure: TRANSURETHRAL RESECTION OF BLADDER TUMOR (TURBT)/ POSTOPERATIVE INSTILLATION OF CHEMO THERAPY;  Surgeon: Festus Aloe, MD;  Location: [facility];  Service: Urology;  Laterality: N/A;   Family History  Problem Relation Age of Onset   Heart disease Mother    Cancer Mother        Lung   Heart attack Mother    Diabetes Mother    Cancer Father        Lung   Diabetes Sister    Cancer Sister        unkown type   Diabetes Brother    Lung cancer Brother        Lung   AAA (abdominal aortic aneurysm) Neg Hx    Colon cancer Neg Hx    Stomach cancer Neg Hx    Esophageal cancer Neg Hx    Liver cancer Neg Hx    Pancreatic cancer Neg Hx    Rectal cancer Neg Hx    Outpatient Medications Prior to Visit  Medication Sig Dispense Refill   acetaminophen (TYLENOL) 500 MG tablet Take 500 mg by mouth every 6 (six) hours as needed for mild pain.     amLODipine (NORVASC) 5 MG tablet TAKE 1 TABLET DAILY 90 tablet 3   Cholecalciferol (VITAMIN D-3) 125 MCG (5000 UT) TABS Take 5,000 Units by mouth daily.     clopidogrel (PLAVIX) 75 MG tablet Take 1 tablet (75 mg total) by mouth daily. 7 tablet 0   gabapentin (NEURONTIN) 100 MG capsule Take 1 capsule (100 mg total) by mouth 2 (two) times daily AND 2 capsules (200 mg total) at bedtime. Take  2 po q HS. 120 capsule 2   hydroxypropyl methylcellulose / hypromellose (ISOPTO TEARS / GONIOVISC) 2.5 % ophthalmic solution Place 1 drop into both eyes at bedtime.     levothyroxine (SYNTHROID) 88 MCG tablet TAKE 1 TABLET DAILY 90 tablet 3   oxyCODONE-acetaminophen (PERCOCET/ROXICET) 5-325 MG tablet Take 1-2 tablets by mouth every  8 (eight) hours as needed for severe pain. 30 tablet 0   pramipexole (MIRAPEX) 0.5 MG tablet Take one nightly one hour prior to going to bed as needed for restless leg syndrome. (Patient taking differently: Take 0.5 mg by mouth at bedtime. Take one nightly one hour prior to going to bed for restless leg syndrome.) 90 tablet 4   pravastatin (PRAVACHOL) 40 MG tablet TAKE 1 TABLET DAILY 90 tablet 3   sertraline (ZOLOFT) 25 MG tablet Take 1 tablet (25 mg total) by mouth daily. 30 tablet 5   ondansetron (ZOFRAN) 4 MG tablet Take 1 tablet (4 mg total) by mouth every 8 (eight) hours as needed for nausea or vomiting. (Patient not taking: Reported on 08/03/2021) 20 tablet 0   ferrous sulfate 325 (65 FE) MG tablet Take 325 mg by mouth daily with breakfast. (Patient not taking: Reported on 08/03/2021)  No facility-administered medications prior to visit.   Allergies  Allergen Reactions   Prednisone Other (See Comments)    Hallucinations   Atorvastatin Other (See Comments)     Lipitor caused arm pain (3/09)   Hydrocodone-Acetaminophen Other (See Comments)    hallucinations   Morphine Other (See Comments)    hallucinations   Nortriptyline     Caused nausea and vomiting and shaking, kept her up all night   Topamax [Topiramate] Nausea And Vomiting    *dizziness*   Tramadol Other (See Comments)    withdrawal   Objective:   Today's Vitals   08/03/21 0835  BP: 130/72  Pulse: 60  Temp: (!) 97.3 F (36.3 C)  TempSrc: Temporal  SpO2: 99%  Weight: 113 lb 3.2 oz (51.3 kg)  Height: 5\' 2"  (1.575 m)   Body mass index is 20.7 kg/m.   General: Well developed, well nourished. No acute distress. Lungs: Clear to auscultation bilaterally. No wheezing, rales or rhonchi. CV: RRR without murmurs or rubs. Pulses 2+ bilaterally. Back: Well healed vertical surgical scar over the mid- lumbar spine. There is a bony prominence in the   upper part of the scar with a step off below. This is the area that is very  tender to the patient. Psych: Alert and oriented. Normal mood and affect.  Health Maintenance Due  Topic Date Due   TETANUS/TDAP  Never done   Zoster Vaccines- Shingrix (1 of 2) Never done   Depression screen [facility] 2/9 08/03/2021 05/03/2021 11/29/2020  Decreased Interest 2 1 0  Down, Depressed, Hopeless 3 2 0  PHQ - 2 Score 5 3 0  Altered sleeping 3 0 -  Tired, decreased energy 3 3 -  Change in appetite 3 3 -  Feeling bad or failure about yourself  0 0 -  Trouble concentrating 3 2 -  Moving slowly or fidgety/restless 0 0 -  Suicidal thoughts 2 0 -  PHQ-9 Score 19 11 -  Difficult doing work/chores Not difficult at all Not difficult at all -  Some recent data might be hidden   GAD 7 : Generalized Anxiety Score 08/03/2021 05/03/2021 10/29/2019  Nervous, Anxious, on Edge 3 3 0  Control/stop worrying 3 2 0  Worry too much - different things 3 1 0  Trouble relaxing 3 1 0  Restless 3 1 0  Easily annoyed or irritable 3 2 0  Afraid - awful might happen 3 - 0  Total GAD 7 Score 21 - 0  Anxiety Difficulty Very difficult Somewhat difficult Not difficult at all    Assessment & Plan:   1. Status post lumbar microdiscectomy 2. Recurrent herniation of lumbar disc Pending surgical scheduling with Dr. Louanne Skye for repeat discectomy. Continue Percocet and gabapentin.  3. Essential hypertension Blood pressure is at goal. Continue amlodipine. I will reassess renal function.  - Basic metabolic panel  4. COPD (chronic obstructive pulmonary disease) with chronic bronchitis (HCC) Stable. Continue to advise smoking cessation.  5. Acquired hypothyroidism Due for repeat TSH today. We will continue levothyroxine, but may need to adjust her dose on this.   - TSH - T4, free  6. History of iron deficiency No longer taking iron supplements, as she has GI upset from these. I will reassess her CBC inlight of pending surgery.  - CBC  7. Anxiety with depression Ana Santos is continue to have a mix of  anxiety and depression symptoms. This appears to be being triggered by her chronic back pain. We  discussed increasing her sertraline dosage to see if we can reduce some of this.  - sertraline (ZOLOFT) 50 MG tablet; Take 1 tablet (50 mg total) by mouth daily.  Dispense: 90 tablet; Refill: 3  Return in about 3 months (around 10/31/2021) for Reassessment.   Haydee Salter, MD

## 2021-08-05 ENCOUNTER — Other Ambulatory Visit: Payer: Self-pay | Admitting: Specialist

## 2021-08-05 MED ORDER — OXYCODONE-ACETAMINOPHEN 5-325 MG PO TABS: 1.0000 | ORAL_TABLET | Freq: Three times a day (TID) | ORAL | 0 refills | Status: DC | PRN

## 2021-08-05 NOTE — Telephone Encounter (Signed)
Patient called needing Rx refilled Oxycodone. The number to contact patient is [phone]

## 2021-08-11 ENCOUNTER — Encounter: Payer: Self-pay | Admitting: Surgery

## 2021-08-11 ENCOUNTER — Other Ambulatory Visit: Payer: Self-pay

## 2021-08-11 ENCOUNTER — Ambulatory Visit (INDEPENDENT_AMBULATORY_CARE_PROVIDER_SITE_OTHER): Payer: Medicare Other | Admitting: Surgery

## 2021-08-11 VITALS — BP 147/76 | HR 79 | Ht 62.0 in | Wt 113.2 lb

## 2021-08-11 DIAGNOSIS — M5417 Radiculopathy, lumbosacral region: Secondary | ICD-10-CM

## 2021-08-11 NOTE — Progress Notes (Signed)
81 year old white female with history of L1-2 HNP/stenosis comes in for prep evaluation.  Symptoms unchanged from previous visit.  She is wanting to proceed with LEFT L1-2 TRANSFORAMINAL LUMBAR INTERBODY FUSION WITH GLOBUS PEDICLE SCREWS, RODS AND SABLE ADJUSTABLE CAGE, LOCAL BONE GRAFT, ALLOGRAFT BONE GRAFT, VIVIGEN as scheduled.  Today history and physical performed.  Review of systems negative.  Patient does have a history of basal cell or artery aneurysm and is followed by Dr. Arlester Marker for this.  Surgery scheduler advised me that Dr. Gena Fray did give his recommendations on when to stop Plavix.  Surgical procedure discussed in detail along with potential hospital stay.  All questions answered. ?

## 2021-08-12 NOTE — Pre-Procedure Instructions (Signed)
? ? Ana Santos ? 08/12/2021  ?  ?  ? Surgical Instructions ? ? Your procedure is scheduled on Tues., August 23, 2021 from 7:30AM-12:15PM. ? Report to [facility] Main Entrance "A" at 5:30 A.M., then check in with the Admitting office. ? Call this number if you have problems the morning of surgery: ? [phone] ? ? Remember: ? Do not eat or drink after midnight the night before your surgery ?  ? Take these medicines the morning of surgery with A SIP OF WATER: ?AmLODipine (NORVASC) ?Gabapentin (NEURONTIN) ?Levothyroxine (SYNTHROID) ?Pravastatin (PRAVACHOL) ?Sertraline (ZOLOFT) ? ?If Needed: ?OxyCODONE-acetaminophen (PERCOCET/ROXICET) ? ?Follow your surgeon's instructions on when to stop Plavix.  If no instructions were given by your surgeon then you will need to call the office to get those instructions.   ? ? ?In 7 days (08/16/21), STOP taking any Aspirin (unless otherwise instructed by your surgeon) Aleve, Naproxen, Ibuprofen, Motrin, Advil, Goody's, BC's, all herbal medications, fish oil, and all vitamins. ? ?           Day of Surgery: ?Do not wear jewelry or makeup. ?Do not wear lotions, powders, perfumes/colognes, or deodorant. ?Do not shave 48 hours prior to surgery.   ?Do not bring valuables to the hospital. ?DO Not wear nail polish, gel polish, artificial nails, or any other type of covering on natural nails (fingers and toes) ?If you have artificial nails or gel coating that need to be removed by a nail salon, please have this removed prior to surgery. Artificial nails or gel coating may interfere with anesthesia's ability to adequately monitor your vital signs. ? ?           [name] is not responsible for any belongings or valuables. ? ?Do NOT Smoke (Tobacco/Vaping)  24 hours prior to your procedure ? ?If you use a CPAP at night, you may bring your mask and machine for your overnight stay. ?  ?Contacts, glasses, hearing aids, dentures or partials may not be worn into surgery, please bring cases for  these belongings ?  ?For patients admitted to the hospital, discharge time will be determined by your treatment team. ?  ?Patients discharged the day of surgery will not be allowed to drive home, and someone needs to stay with them for 24 hours. ? ?NO VISITORS WILL BE ALLOWED IN PRE-OP WHERE PATIENTS ARE PREPPED FOR SURGERY.  ONLY 1 SUPPORT PERSON MAY BE PRESENT IN THE WAITING ROOM WHILE YOU ARE IN SURGERY.  IF YOU ARE TO BE ADMITTED, ONCE YOU ARE IN YOUR ROOM YOU WILL BE ALLOWED TWO (2) VISITORS. 1 (ONE) VISITOR MAY STAY OVERNIGHT BUT MUST ARRIVE TO THE ROOM BY 8pm.  Minor children may have two parents present. Special consideration for safety and communication needs will be reviewed on a case by case basis. ? ?Special instructions:   ? ?Oral Hygiene is also important to reduce your risk of infection.  Remember - BRUSH YOUR TEETH THE MORNING OF SURGERY WITH YOUR REGULAR TOOTHPASTE ? ?[facility]- Preparing For Surgery ? ?Before surgery, you can play an important role. Because skin is not sterile, your skin needs to be as free of germs as possible. You can reduce the number of germs on your skin by washing with CHG (chlorahexidine gluconate) Soap before surgery.  CHG is an antiseptic cleaner which kills germs and bonds with the skin to continue killing germs even after washing.   ? ?Please do not use if you have an allergy to CHG or  antibacterial soaps. If your skin becomes reddened/irritated stop using the CHG.  ?Do not shave (including legs and underarms) for at least 48 hours prior to first CHG shower. It is OK to shave your face. ? ?Please follow these instructions carefully. ?  ? Shower the NIGHT BEFORE SURGERY and the MORNING OF SURGERY with CHG Soap.  ? If you chose to wash your hair, wash your hair first as usual with your normal shampoo. After you shampoo, rinse your hair and body thoroughly to remove the shampoo.  Then [employer] and genitals (private parts) with your normal soap and rinse thoroughly to  remove soap. ? ?After that Use CHG Soap as you would any other liquid soap. You can apply CHG directly to the skin and wash gently with a scrungie or a clean washcloth.  ? ?Apply the CHG Soap to your body ONLY FROM THE NECK DOWN.  Do not use on open wounds or open sores. Avoid contact with your eyes, ears, mouth and genitals (private parts). Wash Face and genitals (private parts)  with your normal soap.  ? ?Wash thoroughly, paying special attention to the area where your surgery will be performed. ? ?Thoroughly rinse your body with warm water from the neck down. ? ?DO NOT shower/wash with your normal soap after using and rinsing off the CHG Soap. ? ?Pat yourself dry with a CLEAN TOWEL. ? ?Wear CLEAN PAJAMAS to bed the night before surgery ? ?Place CLEAN SHEETS on your bed the night before your surgery ? ?DO NOT SLEEP WITH PETS. ? ?Reminder: ?Take a shower with CHG soap. ?Wear Clean/Comfortable clothing the morning of surgery ?Do not apply any deodorants/lotions.   ?Remember to brush your teeth WITH YOUR REGULAR TOOTHPASTE. ? ?After your COVID test  ? ?You are not required to quarantine however you are required to wear a well-fitting mask when you are out and around people not in your household.  If your mask becomes wet or soiled, replace with a new one. ? ?Wash your hands often with soap and water for 20 seconds or clean your hands with an alcohol-based hand sanitizer that contains at least 60% alcohol. ? ?Do not share personal items. ? ?Notify your provider: ?if you are in close contact with someone who has COVID  ?or if you develop a fever of 100.4 or greater, sneezing, cough, sore throat, shortness of breath or body aches. ? ?Please read over the following fact sheets that you were given.  ?  ? ? ?  ?

## 2021-08-15 ENCOUNTER — Encounter (HOSPITAL_COMMUNITY)
Admission: RE | Admit: 2021-08-15 | Discharge: 2021-08-15 | Disposition: A | Discharge status: Home / Self Care | Payer: Medicare Other | Source: Ambulatory Visit | Attending: Specialist | Admitting: Specialist

## 2021-08-15 ENCOUNTER — Ambulatory Visit (HOSPITAL_COMMUNITY)
Admission: RE | Admit: 2021-08-15 | Discharge: 2021-08-15 | Disposition: A | Discharge status: Home / Self Care | Payer: Medicare Other | Source: Ambulatory Visit | Attending: Surgery | Admitting: Surgery

## 2021-08-15 ENCOUNTER — Encounter (HOSPITAL_COMMUNITY): Payer: Self-pay

## 2021-08-15 ENCOUNTER — Other Ambulatory Visit: Payer: Self-pay

## 2021-08-15 VITALS — BP 141/63 | HR 63 | Temp 98.6°F | Resp 18 | Ht 62.0 in | Wt 114.8 lb

## 2021-08-15 DIAGNOSIS — Z01818 Encounter for other preprocedural examination: Secondary | ICD-10-CM | POA: Diagnosis not present

## 2021-08-15 DIAGNOSIS — Z86018 Personal history of other benign neoplasm: Secondary | ICD-10-CM | POA: Diagnosis not present

## 2021-08-15 DIAGNOSIS — E785 Hyperlipidemia, unspecified: Secondary | ICD-10-CM | POA: Diagnosis not present

## 2021-08-15 DIAGNOSIS — I10 Essential (primary) hypertension: Secondary | ICD-10-CM | POA: Diagnosis not present

## 2021-08-15 DIAGNOSIS — M47816 Spondylosis without myelopathy or radiculopathy, lumbar region: Secondary | ICD-10-CM | POA: Insufficient documentation

## 2021-08-15 DIAGNOSIS — M419 Scoliosis, unspecified: Secondary | ICD-10-CM | POA: Insufficient documentation

## 2021-08-15 DIAGNOSIS — E039 Hypothyroidism, unspecified: Secondary | ICD-10-CM | POA: Insufficient documentation

## 2021-08-15 DIAGNOSIS — K589 Irritable bowel syndrome without diarrhea: Secondary | ICD-10-CM | POA: Diagnosis not present

## 2021-08-15 DIAGNOSIS — I2510 Atherosclerotic heart disease of native coronary artery without angina pectoris: Secondary | ICD-10-CM | POA: Insufficient documentation

## 2021-08-15 DIAGNOSIS — J449 Chronic obstructive pulmonary disease, unspecified: Secondary | ICD-10-CM | POA: Diagnosis not present

## 2021-08-15 DIAGNOSIS — Z79890 Hormone replacement therapy (postmenopausal): Secondary | ICD-10-CM | POA: Insufficient documentation

## 2021-08-15 DIAGNOSIS — I872 Venous insufficiency (chronic) (peripheral): Secondary | ICD-10-CM | POA: Diagnosis not present

## 2021-08-15 DIAGNOSIS — Z8673 Personal history of transient ischemic attack (TIA), and cerebral infarction without residual deficits: Secondary | ICD-10-CM | POA: Diagnosis not present

## 2021-08-15 DIAGNOSIS — M5126 Other intervertebral disc displacement, lumbar region: Secondary | ICD-10-CM | POA: Diagnosis not present

## 2021-08-15 DIAGNOSIS — Z8669 Personal history of other diseases of the nervous system and sense organs: Secondary | ICD-10-CM | POA: Insufficient documentation

## 2021-08-15 DIAGNOSIS — R002 Palpitations: Secondary | ICD-10-CM | POA: Diagnosis not present

## 2021-08-15 DIAGNOSIS — Z7902 Long term (current) use of antithrombotics/antiplatelets: Secondary | ICD-10-CM | POA: Diagnosis not present

## 2021-08-15 DIAGNOSIS — Z8551 Personal history of malignant neoplasm of bladder: Secondary | ICD-10-CM | POA: Diagnosis not present

## 2021-08-15 DIAGNOSIS — H9191 Unspecified hearing loss, right ear: Secondary | ICD-10-CM | POA: Insufficient documentation

## 2021-08-15 DIAGNOSIS — R011 Cardiac murmur, unspecified: Secondary | ICD-10-CM | POA: Insufficient documentation

## 2021-08-15 DIAGNOSIS — F17210 Nicotine dependence, cigarettes, uncomplicated: Secondary | ICD-10-CM | POA: Diagnosis not present

## 2021-08-15 DIAGNOSIS — I341 Nonrheumatic mitral (valve) prolapse: Secondary | ICD-10-CM | POA: Diagnosis not present

## 2021-08-15 DIAGNOSIS — M5136 Other intervertebral disc degeneration, lumbar region: Secondary | ICD-10-CM | POA: Diagnosis not present

## 2021-08-15 LAB — URINALYSIS, ROUTINE W REFLEX MICROSCOPIC
Bilirubin Urine: NEGATIVE
Glucose, UA: NEGATIVE mg/dL
Ketones, ur: NEGATIVE mg/dL
Leukocytes,Ua: NEGATIVE
Nitrite: NEGATIVE
Protein, ur: NEGATIVE mg/dL
Specific Gravity, Urine: 1.012 (ref 1.005–1.030)
pH: 5 (ref 5.0–8.0)

## 2021-08-15 LAB — SURGICAL PCR SCREEN
MRSA, PCR: NEGATIVE
Staphylococcus aureus: NEGATIVE

## 2021-08-15 LAB — COMPREHENSIVE METABOLIC PANEL
ALT: 15 U/L (ref 0–44)
AST: 19 U/L (ref 15–41)
Albumin: 3.8 g/dL (ref 3.5–5.0)
Alkaline Phosphatase: 119 U/L (ref 38–126)
Anion gap: 10 (ref 5–15)
BUN: 16 mg/dL (ref 8–23)
CO2: 24 mmol/L (ref 22–32)
Calcium: 9.6 mg/dL (ref 8.9–10.3)
Chloride: 108 mmol/L (ref 98–111)
Creatinine, Ser: 0.94 mg/dL (ref 0.44–1.00)
GFR, Estimated: >60 mL/min (ref >60)
Glucose, Bld: 97 mg/dL (ref 70–99)
Potassium: 4.3 mmol/L (ref 3.5–5.1)
Sodium: 142 mmol/L (ref 135–145)
Total Bilirubin: 0.3 mg/dL (ref 0.3–1.2)
Total Protein: 7.2 g/dL (ref 6.5–8.1)

## 2021-08-15 LAB — TYPE AND SCREEN
ABO/RH(D): A POS
Antibody Screen: NEGATIVE

## 2021-08-15 LAB — CBC
HCT: 39.1 % (ref 36.0–46.0)
Hemoglobin: 12 g/dL (ref 12.0–15.0)
MCH: 28.4 pg (ref 26.0–34.0)
MCHC: 30.7 g/dL (ref 30.0–36.0)
MCV: 92.7 fL (ref 80.0–100.0)
Platelets: 295 10*3/uL (ref 150–400)
RBC: 4.22 MIL/uL (ref 3.87–5.11)
RDW: 14.1 % (ref 11.5–15.5)
WBC: 9.9 10*3/uL (ref 4.0–10.5)
nRBC: 0 % (ref 0.0–0.2)

## 2021-08-15 LAB — PROTIME-INR
INR: 1 (ref 0.8–1.2)
Prothrombin Time: 12.8 seconds (ref 11.4–15.2)

## 2021-08-15 NOTE — Pre-Procedure Instructions (Signed)
Surgical Instructions ? ? ? Your procedure is scheduled on Tuesday, March 14th. ? Report to [facility] Main Entrance "A" at 5:30 A.M., then check in with the Admitting office. ? Call this number if you have problems the morning of surgery: ? [phone] ? ? If you have any questions prior to your surgery date call [phone]: Open Monday-Friday 8am-4pm ? ? ? Remember: ? Do not eat after midnight the night before your surgery ? ?You may drink clear liquids until 4:30 a.m. the morning of your surgery.   ?Clear liquids allowed are: Water, Non-Citrus Juices (without pulp), Carbonated Beverages, Clear Tea, Black Coffee Only (NO MILK, CREAM OR POWDERED CREAMER of any kind), and Gatorade. ? ? ?Enhanced Recovery after Surgery for Orthopedics ?Enhanced Recovery after Surgery is a protocol used to improve the stress on your body and your recovery after surgery. ? ?Patient Instructions ? ?The day of surgery (if you do NOT have diabetes):  ?Drink ONE (1) Pre-Surgery Clear Ensure by 4:30 am the morning of surgery   ?This drink was given to you during your hospital  ?pre-op appointment visit. ?Nothing else to drink after completing the  ?Pre-Surgery Clear Ensure. ? ?       If you have questions, please contact your surgeon?s office. ? ? ? Take these medicines the morning of surgery with A SIP OF WATER: ?AmLODipine (NORVASC) ?Gabapentin (NEURONTIN) ?Levothyroxine (SYNTHROID) ?Pravastatin (PRAVACHOL) ?Sertraline (ZOLOFT) ? ?If Needed: ?OxyCODONE-acetaminophen (PERCOCET/ROXICET) ? ?Follow your surgeon's instructions on when to stop Plavix.  If no instructions were given by your surgeon then you will need to call the office to get those instructions.   ? ?In 7 days (08/16/21), STOP taking any Aspirin (unless otherwise instructed by your surgeon) Aleve, Naproxen, Ibuprofen, Motrin, Advil, Goody's, BC's, all herbal medications, fish oil, and all vitamins. ? ?As of today, STOP taking any Aspirin (unless otherwise instructed by your  surgeon) Aleve, Naproxen, Ibuprofen, Motrin, Advil, Goody's, BC's, all herbal medications, fish oil, and all vitamins. ?         ?           ?Do NOT Smoke (Tobacco/Vaping) for 24 hours prior to your procedure. ? ?If you use a CPAP at night, you may bring your mask/headgear for your overnight stay. ?  ?Contacts, glasses, piercing's, hearing aid's, dentures or partials may not be worn into surgery, please bring cases for these belongings.  ?  ?For patients admitted to the hospital, discharge time will be determined by your treatment team. ?  ?Patients discharged the day of surgery will not be allowed to drive home, and someone needs to stay with them for 24 hours. ? ?NO VISITORS WILL BE ALLOWED IN PRE-OP WHERE PATIENTS ARE PREPPED FOR SURGERY.  ONLY 1 SUPPORT PERSON MAY BE PRESENT IN THE WAITING ROOM WHILE YOU ARE IN SURGERY.  IF YOU ARE TO BE ADMITTED, ONCE YOU ARE IN YOUR ROOM YOU WILL BE ALLOWED TWO (2) VISITORS. (1) VISITOR MAY STAY OVERNIGHT BUT MUST ARRIVE TO THE ROOM BY 8pm.  Minor children may have two parents present. Special consideration for safety and communication needs will be reviewed on a case by case basis. ? ? ?Special instructions:   ?[place]- Preparing For Surgery ? ?Before surgery, you can play an important role. Because skin is not sterile, your skin needs to be as free of germs as possible. You can reduce the number of germs on your skin by washing with CHG (chlorahexidine gluconate) Soap before surgery.  CHG  is an antiseptic cleaner which kills germs and bonds with the skin to continue killing germs even after washing.   ? ?Oral Hygiene is also important to reduce your risk of infection.  Remember - BRUSH YOUR TEETH THE MORNING OF SURGERY WITH YOUR REGULAR TOOTHPASTE ? ?Please do not use if you have an allergy to CHG or antibacterial soaps. If your skin becomes reddened/irritated stop using the CHG.  ?Do not shave (including legs and underarms) for at least 48 hours prior to first CHG  shower. It is OK to shave your face. ? ?Please follow these instructions carefully. ?  ?Shower the NIGHT BEFORE SURGERY and the MORNING OF SURGERY ? ?If you chose to wash your hair, wash your hair first as usual with your normal shampoo. ? ?After you shampoo, rinse your hair and body thoroughly to remove the shampoo. ? ?Use CHG Soap as you would any other liquid soap. You can apply CHG directly to the skin and wash gently with a scrungie or a clean washcloth.  ? ?Apply the CHG Soap to your body ONLY FROM THE NECK DOWN.  Do not use on open wounds or open sores. Avoid contact with your eyes, ears, mouth and genitals (private parts). Wash Face and genitals (private parts)  with your normal soap.  ? ?Wash thoroughly, paying special attention to the area where your surgery will be performed. ? ?Thoroughly rinse your body with warm water from the neck down. ? ?DO NOT shower/wash with your normal soap after using and rinsing off the CHG Soap. ? ?Pat yourself dry with a CLEAN TOWEL. ? ?Wear CLEAN PAJAMAS to bed the night before surgery ? ?Place CLEAN SHEETS on your bed the night before your surgery ? ?DO NOT SLEEP WITH PETS. ? ? ?Day of Surgery: ?Take a shower with CHG soap. ?Do not wear jewelry or makeup ?Do not wear lotions, powders, perfumes, or deodorant. ?Do not shave 48 hours prior to surgery.  ?Do not bring valuables to the hospital. [facility] is not responsible for any belongings or valuables. ?Do not wear nail polish, gel polish, artificial nails, or any other type of covering on natural nails (fingers and toes) ?If you have artificial nails or gel coating that need to be removed by a nail salon, please have this removed prior to surgery. Artificial nails or gel coating may interfere with anesthesia's ability to adequately monitor your vital signs. ?Wear Clean/Comfortable clothing the morning of surgery ?Do not apply any deodorants/lotions.   ?Remember to brush your teeth WITH YOUR REGULAR TOOTHPASTE. ?  ?Please  read over the following fact sheets that you were given. ? ? ?3 days prior to your procedure or After your COVID test ? ?? You are not required to quarantine however you are required to wear a well-fitting mask when you are out and around people not in your household. If your mask becomes wet or soiled, replace with a new one. ? ?? Wash your hands often with soap and water for 20 seconds or clean your hands with an alcohol-based hand sanitizer that contains at least 60% alcohol. ? ?? Do not share personal items. ? ?? Notify your provider: ? ?o if you are in close contact with someone who has COVID ? ?o or if you develop a fever of 100.4 or greater, sneezing, cough, sore throat, shortness of breath or body aches.  ? ?

## 2021-08-15 NOTE — Progress Notes (Signed)
PCP - Dr. Arlester Marker ?Cardiologist - denies ? ?PPM/ICD - n/a ? ?Chest x-ray - 08/15/21 ?EKG - 02/04/22-E ?Stress Test - 1999 ?ECHO - 08/18/14 ?Cardiac Cath -08/05/14  ? ?Sleep Study - denies ?CPAP - denies ? ?Blood Thinner Instructions:Plavix, per pt, she was told be Dr. Louanne Skye to hold starting 3/9 but she then received a call from Dr. Otho Ket PA to wait for a phone call as he was reaching out to her PCP for guidance on the Plavix. She has not received that call as of yet. This RN advised pt to reach out to surgeon's office if she has not received a call by Wednesday, 3/8. ?Aspirin Instructions: n/a ? ?ERAS Protcol -Clear liquids until 0430 DOS ?PRE-SURGERY Ensure or G2- (1) Ensure provided.  ? ?COVID TEST- Scheduled for 3/10 in PAT at 0905. ? ?Anesthesia review: Yes, heart hx.  ? ?Patient denies shortness of breath, fever, cough and chest pain at PAT appointment ? ? ?All instructions explained to the patient, with a verbal understanding of the material. Patient agrees to go over the instructions while at home for a better understanding. Patient also instructed to self quarantine after being tested for COVID-19. The opportunity to ask questions was provided. ? ? ?

## 2021-08-16 ENCOUNTER — Encounter (HOSPITAL_COMMUNITY): Payer: Self-pay

## 2021-08-16 NOTE — Anesthesia Preprocedure Evaluation (Addendum)
Anesthesia Evaluation  Patient identified by MRN, date of birth, ID band Patient awake    Reviewed: Allergy & Precautions, NPO status , Patient's Chart, lab work & pertinent test results  History of Anesthesia Complications Negative for: history of anesthetic complications  Airway Mallampati: III  TM Distance: >3 FB Neck ROM: Full    Dental  (+) Teeth Intact, Dental Advisory Given   Pulmonary neg shortness of breath, neg sleep apnea, COPD, neg recent URI, Current Smoker and Patient abstained from smoking.,    breath sounds clear to auscultation       Cardiovascular hypertension, Pt. on medications (-) angina+ CAD  (-) Past MI and (-) CHF + Valvular Problems/Murmurs MR  Rhythm:Regular  Left ventricle: The cavity size was normal. Systolic function was  vigorous. The estimated ejection fraction was in the range of 65%  to 70%. Wall motion was normal; there were no regional wall  motion abnormalities. Doppler parameters are consistent with  abnormal left ventricular relaxation (grade 1 diastolic  dysfunction). There was no evidence of elevated ventricular  filling pressure by Doppler parameters.  - Aortic valve: Trileaflet; normal thickness leaflets. There was no  regurgitation.  - Aortic root: The aortic root was normal in size.  - Mitral valve: Structurally normal valve.  - Left atrium: The atrium was normal in size.  - Right ventricle: The cavity size was normal. Wall thickness was  normal. Systolic function was normal.  - Right atrium: The atrium was normal in size.  - Tricuspid valve: Structurally normal valve. There was trivial  regurgitation.  - Pulmonic valve: There was no regurgitation.  - Pulmonary arteries: Systolic pressure was within the normal  range.  - Inferior vena cava: The vessel was normal in size.  - Pericardium, extracardiac: There was no pericardial effusion.       Neuro/Psych   Headaches, PSYCHIATRIC DISORDERS Anxiety Depression TIA Neuromuscular disease CVA    GI/Hepatic negative GI ROS, Neg liver ROS,   Endo/Other  Hypothyroidism   Renal/GU negative Renal ROSLab Results      Component                Value               Date                      CREATININE               0.94                08/15/2021                Musculoskeletal  (+) Fibromyalgia -  Abdominal   Peds  Hematology negative hematology ROS (+)   Anesthesia Other Findings  History includes smoking, COPD, HTN, CAD (minor luminal irregularities 2016), murmur/MVP (trivial MR/TR, structurally normal MV, normal leaflet separation 08/2014 echo), palpitations, CVA (TIA x3, last 2014), 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   Reproductive/Obstetrics                           Anesthesia Physical Anesthesia Plan  ASA: 3  Anesthesia Plan: General   Post-op Pain Management: Tylenol PO (pre-op)*   Induction: Intravenous  PONV Risk Score and Plan: 2 and Ondansetron and Dexamethasone  Airway Management Planned: Oral ETT  Additional Equipment: None  Intra-op Plan:   Post-operative Plan: Extubation in OR  Informed Consent: I have reviewed the patients History and Physical, chart, labs and discussed the procedure including the risks, benefits and alternatives for the proposed anesthesia with the patient or authorized representative who has indicated his/her understanding and acceptance.     Dental advisory given  Plan Discussed with: CRNA and Anesthesiologist  Anesthesia Plan Comments: (PAT note written 08/16/2021 by Shonna Chock, PA-C. )       Anesthesia Quick Evaluation

## 2021-08-16 NOTE — Progress Notes (Signed)
Anesthesia Chart Review:  Case: [id number] Date/Time: 08/23/21 0715   Procedure: LEFT L1-2 TRANSFORAMINAL LUMBAR INTERBODY FUSION WITH GLOBUS PEDICLE SCREWS, RODS AND SABLE ADJUSTABLE CAGE, LOCAL BONE GRAFT, ALLOGRAFT BONE GRAFT, VIVIGEN   Anesthesia type: General   Pre-op diagnosis: L1-2 lumbar spondylosis, scoliosis, degenerative disc disease, herniated nucleus pulposus   [facility] / [facility]   Surgeons: Jessy Oto, MD       DISCUSSION: Patient is an 81 year old female scheduled for the above procedure.  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   Patient was evaluated for chest pain in 2016 by Dr. Kathlyn Sacramento and ultimately underwent left heart cath showing minor luminal irregularities with no evidence of obstructive disease and normal LV systolic function.  Her symptoms were felt likely due to frequent PVCs.  Her metoprolol was increased at that time and echo was ordered. Echo showed EF 65 to 70%, normal wall motion, grade 1 DD, no significant valvular abnormalities.  PCP is Dr. Gena Fray with [facility]. Last visit 08/03/21 for 71-monthfollow-up of chronic medical issues.  He notes she had temporary relief after her September 22 back surgery but was now for repeat surgery with Dr. NLouanne Skye He was following thyroid studies after recent levothyroxine medication dose adjustment. Smoking cessation advised. BP at goal. Dr. NLouanne Skye office is going to clarify Plavix instructions with PCP and let patient know.   Presurgical COVID-19 test is scheduled for  08/19/2021.  Anesthesia team to evaluate on the day of surgery.   VS: BP (!) 141/63    Pulse 63    Temp 37 C (Oral)    Resp 18    Ht '5\' 2"'$  (1.575 m)    Wt 52.1 kg    SpO2 99%    BMI 21.00 kg/m    PROVIDERS: RHaydee Salter MD is PCP    LABS: Labs reviewed: Acceptable for surgery. (all labs ordered are listed, but only abnormal results are displayed)  Labs Reviewed  URINALYSIS, ROUTINE W REFLEX MICROSCOPIC - Abnormal; Notable for the following components:      Result Value   Hgb urine dipstick SMALL (*)    Bacteria, UA RARE (*)    All other components within normal limits  SURGICAL PCR SCREEN  CBC  COMPREHENSIVE METABOLIC PANEL  PROTIME-INR  TYPE AND SCREEN   Spirometry > 6 years ago.   IMAGES: CXR 08/15/21: FINDINGS: Lung volumes are normal. Mild linear scarring in the inferior aspect of the lingula, similar to the prior study. No consolidative airspace disease. No pleural effusions. No pneumothorax. No pulmonary nodule or mass noted. Pulmonary vasculature and the cardiomediastinal silhouette are within normal limits. Atherosclerosis in the thoracic aorta. IMPRESSION: 1.  No radiographic evidence of acute cardiopulmonary disease. 2. Aortic atherosclerosis.   MRI L-spine 04/26/21: IMPRESSION: 1. Interval left laminectomy at L1-L2 with new left subarticular disc protrusion posteriorly displacing the descending left L2 nerve root. 2. Otherwise unchanged multilevel lumbar spondylosis as described above.   MRI Brain 02/12/13: IMPRESSION:  - No evidence of residual or recurrent vestibular schwannoma on the  right.  Previous surgical changes with some scarring within the  internal auditory canal.  -  Old small vessel infarctions appear unchanged.  No acute or  subacute brain insult.  - Previously coiled basilar tip aneurysm.    EKG: 02/04/21: Sinus  Bradycardia  Low voltage -possible pulmonary disease.    CV: Cartoid [place]  02/28/19: IMPRESSION: Right: Heterogeneous and partially calcified plaque at the right carotid bifurcation contributes to 50%-69% stenosis by established duplex criteria.   Left: Color duplex indicates minimal heterogeneous and calcified plaque, with no hemodynamically significant stenosis by duplex criteria in the left extracranial cerebrovascular circulation.   TTE 08/18/2014: - Left ventricle: The cavity size was normal. Systolic function was    vigorous. The estimated ejection fraction was in the range of 65%    to 70%. Wall motion was normal; there were no regional wall    motion abnormalities. Doppler parameters are consistent with    abnormal left ventricular relaxation (grade 1 diastolic    dysfunction). There was no evidence of elevated ventricular    filling pressure by Doppler parameters.  - Aortic valve: Trileaflet; normal thickness leaflets. There was no    regurgitation.  - Aortic root: The aortic root was normal in size.  - Mitral valve: Structurally normal valve.  - Left atrium: The atrium was normal in size.  - Right ventricle: The cavity size was normal. Wall thickness was    normal. Systolic function was normal.  - Right atrium: The atrium was normal in size.  - Tricuspid valve: Structurally normal valve. There was trivial    regurgitation.  - Pulmonic valve: There was no regurgitation.  - Pulmonary arteries: Systolic pressure was within the normal    range.  - Inferior vena cava: The vessel was normal in size.  - Pericardium, extracardiac: There was no pericardial effusion.    Impressions:    - Trivial mitral and tricuspid regurgitation, otherwise normal    study.     Cath 08/05/2014: Left ventriculography: Left ventricular systolic function is normal , LVEF is estimated at 55 %, there is no significant mitral regurgitation    Final Conclusions:   1. Minor luminal irregularities with no evidence of obstructive disease. 2. Normal LV systolic function with  mildly elevated left ventricular end-diastolic pressure.   Recommendations:  The patient's symptoms are likely due to frequent PVCs. Increased the dose of metoprolol and obtain an echocardiogram. Holter monitor can be considered to quantify the PVCs if symptoms persist.     Past Medical History:  Diagnosis Date   AN (acoustic neuroma) ([place])    Aneurysm ([place])    Anxiety    Bladder cancer ([place])    Cataract    Cerebrovascular disease    Cigarette smoker    COPD (chronic obstructive pulmonary disease) ([facility])    mild, no inhalers or oxygen used   Coronary artery disease    no current cardiologist  pt. denies   Deafness in right ear    Depression    Headache    tension headaches due to aneurysm repair with stent   Heart murmur    History of kidney stones    15 to 20 yrs ago   Hyperlipidemia    Hypertension    Hypothyroidism    IBS (irritable bowel syndrome)    Mitral valve prolapse    mild takes beta blocker   Osteopenia    Palpitations    Stroke ([place])    x 3 ministrokes last one feb 2014   Venous insufficiency    Vitamin D deficiency     Past Surgical History:  Procedure Laterality Date   ANEURYSM COILING  04/2009   Dr. Estanislado Pandy   APPENDECTOMY     BACK SURGERY  02/28/2021   BREAST LUMPECTOMY Right 1986   benign   DIAGNOSTIC LAPAROSCOPY     Proable laparoscopic right colectomy 03-14-18 Dr. Excell Seltzer   EYE SURGERY Bilateral    ioc lens for cataracts   LAPAROSCOPIC RIGHT COLECTOMY Right 03/14/2018   Procedure: LAPAROSCOPIC ASSISTED  RIGHT COLECTOMY;  Surgeon: Excell Seltzer, MD;  Location: [facility];  Service: General;  Laterality: Right;   LAPAROSCOPY N/A 03/14/2018   Procedure: LAPAROSCOPY;  Surgeon: Excell Seltzer, MD;  [facility]: [facility];  Service: General;  Laterality: N/A;   LEFT HEART CATHETERIZATION WITH CORONARY ANGIOGRAM N/A 08/05/2014   Procedure: LEFT HEART CATHETERIZATION WITH CORONARY ANGIOGRAM;  Surgeon: Wellington Hampshire, MD;  Location: [facility];   Service: Cardiovascular;  Laterality: N/A;   LUMBAR LAMINECTOMY N/A 02/28/2021   Procedure: LEFT L1-L2 MICRODISCECTOMY AND FASCIECTOMY;  Surgeon: Marybelle Killings, MD;  Location: [place] [facility];  Service: Orthopedics;  Laterality: N/A;   TONSILLECTOMY     TOTAL ABDOMINAL HYSTERECTOMY  1970   complete   TRANSLABYRINTHINE PROCEDURE  2002   [facility] Dr. Vicie Mutters tumor removal   TRANSURETHRAL RESECTION OF BLADDER TUMOR N/A 02/08/2018   Procedure: TRANSURETHRAL RESECTION OF BLADDER TUMOR (TURBT)/ POSTOPERATIVE INSTILLATION OF CHEMO THERAPY;  Surgeon: Festus Aloe, MD;  Location: [facility];  Service: Urology;  Laterality: N/A;    MEDICATIONS:  acetaminophen (TYLENOL) 500 MG tablet   amLODipine (NORVASC) 5 MG tablet   Cholecalciferol (VITAMIN D-3) 125 MCG (5000 UT) TABS   clopidogrel (PLAVIX) 75 MG tablet   gabapentin (NEURONTIN) 100 MG capsule   hydroxypropyl methylcellulose / hypromellose (ISOPTO TEARS / GONIOVISC) 2.5 % ophthalmic solution   levothyroxine (SYNTHROID) 88 MCG tablet   ondansetron (ZOFRAN) 4 MG tablet   oxyCODONE-acetaminophen (PERCOCET/ROXICET) 5-325 MG tablet   pramipexole (MIRAPEX) 0.5 MG tablet   pravastatin (PRAVACHOL) 40 MG tablet   sertraline (ZOLOFT) 50 MG tablet   No current facility-administered medications for this encounter.    Myra Gianotti, PA-C Surgical Short Stay/Anesthesiology [facility] Phone [phone] [facility] Phone [phone] 08/16/2021 4:46 PM

## 2021-08-19 ENCOUNTER — Other Ambulatory Visit (HOSPITAL_COMMUNITY)
Admission: RE | Admit: 2021-08-19 | Discharge: 2021-08-19 | Disposition: A | Discharge status: Home / Self Care | Payer: Medicare Other | Source: Ambulatory Visit | Attending: Specialist | Admitting: Specialist

## 2021-08-19 DIAGNOSIS — Z01818 Encounter for other preprocedural examination: Secondary | ICD-10-CM

## 2021-08-19 DIAGNOSIS — Z20822 Contact with and (suspected) exposure to covid-19: Secondary | ICD-10-CM | POA: Diagnosis not present

## 2021-08-19 DIAGNOSIS — Z01812 Encounter for preprocedural laboratory examination: Secondary | ICD-10-CM | POA: Insufficient documentation

## 2021-08-19 LAB — SARS CORONAVIRUS 2 (TAT 6-24 HRS): SARS Coronavirus 2: NEGATIVE

## 2021-08-23 ENCOUNTER — Inpatient Hospital Stay (HOSPITAL_COMMUNITY)
Admission: RE | Admit: 2021-08-23 | Discharge: 2021-08-26 | DRG: 453 | Disposition: A | Discharge status: Home / Self Care | Payer: Medicare Other | Attending: Specialist | Admitting: Specialist

## 2021-08-23 ENCOUNTER — Ambulatory Visit (HOSPITAL_COMMUNITY): Payer: Medicare Other | Admitting: Vascular Surgery

## 2021-08-23 ENCOUNTER — Other Ambulatory Visit: Payer: Self-pay

## 2021-08-23 ENCOUNTER — Ambulatory Visit (HOSPITAL_COMMUNITY): Payer: Medicare Other

## 2021-08-23 ENCOUNTER — Encounter (HOSPITAL_COMMUNITY): Payer: Self-pay | Admitting: Specialist

## 2021-08-23 ENCOUNTER — Encounter (HOSPITAL_COMMUNITY)
Admission: RE | Disposition: A | Discharge status: Home / Self Care | Payer: Self-pay | Source: Home / Self Care | Attending: Specialist

## 2021-08-23 DIAGNOSIS — I10 Essential (primary) hypertension: Secondary | ICD-10-CM | POA: Diagnosis present

## 2021-08-23 DIAGNOSIS — J9589 Other postprocedural complications and disorders of respiratory system, not elsewhere classified: Secondary | ICD-10-CM | POA: Diagnosis not present

## 2021-08-23 DIAGNOSIS — R079 Chest pain, unspecified: Secondary | ICD-10-CM | POA: Diagnosis not present

## 2021-08-23 DIAGNOSIS — G2581 Restless legs syndrome: Secondary | ICD-10-CM | POA: Diagnosis present

## 2021-08-23 DIAGNOSIS — Z20822 Contact with and (suspected) exposure to covid-19: Secondary | ICD-10-CM | POA: Diagnosis present

## 2021-08-23 DIAGNOSIS — F17210 Nicotine dependence, cigarettes, uncomplicated: Secondary | ICD-10-CM | POA: Diagnosis present

## 2021-08-23 DIAGNOSIS — E559 Vitamin D deficiency, unspecified: Secondary | ICD-10-CM | POA: Diagnosis present

## 2021-08-23 DIAGNOSIS — Z9049 Acquired absence of other specified parts of digestive tract: Secondary | ICD-10-CM | POA: Diagnosis not present

## 2021-08-23 DIAGNOSIS — I872 Venous insufficiency (chronic) (peripheral): Secondary | ICD-10-CM | POA: Diagnosis present

## 2021-08-23 DIAGNOSIS — Z8673 Personal history of transient ischemic attack (TIA), and cerebral infarction without residual deficits: Secondary | ICD-10-CM | POA: Diagnosis not present

## 2021-08-23 DIAGNOSIS — E039 Hypothyroidism, unspecified: Secondary | ICD-10-CM | POA: Diagnosis present

## 2021-08-23 DIAGNOSIS — M4186 Other forms of scoliosis, lumbar region: Secondary | ICD-10-CM

## 2021-08-23 DIAGNOSIS — M419 Scoliosis, unspecified: Secondary | ICD-10-CM | POA: Diagnosis present

## 2021-08-23 DIAGNOSIS — J4489 Other specified chronic obstructive pulmonary disease: Secondary | ICD-10-CM | POA: Diagnosis present

## 2021-08-23 DIAGNOSIS — H9191 Unspecified hearing loss, right ear: Secondary | ICD-10-CM | POA: Diagnosis present

## 2021-08-23 DIAGNOSIS — F419 Anxiety disorder, unspecified: Secondary | ICD-10-CM | POA: Diagnosis present

## 2021-08-23 DIAGNOSIS — Z888 Allergy status to other drugs, medicaments and biological substances status: Secondary | ICD-10-CM

## 2021-08-23 DIAGNOSIS — M4184 Other forms of scoliosis, thoracic region: Secondary | ICD-10-CM | POA: Diagnosis not present

## 2021-08-23 DIAGNOSIS — R0902 Hypoxemia: Secondary | ICD-10-CM | POA: Diagnosis not present

## 2021-08-23 DIAGNOSIS — F17209 Nicotine dependence, unspecified, with unspecified nicotine-induced disorders: Secondary | ICD-10-CM | POA: Diagnosis present

## 2021-08-23 DIAGNOSIS — Y95 Nosocomial condition: Secondary | ICD-10-CM | POA: Diagnosis not present

## 2021-08-23 DIAGNOSIS — M4156 Other secondary scoliosis, lumbar region: Secondary | ICD-10-CM

## 2021-08-23 DIAGNOSIS — M5116 Intervertebral disc disorders with radiculopathy, lumbar region: Principal | ICD-10-CM | POA: Diagnosis present

## 2021-08-23 DIAGNOSIS — J440 Chronic obstructive pulmonary disease with acute lower respiratory infection: Secondary | ICD-10-CM | POA: Diagnosis present

## 2021-08-23 DIAGNOSIS — Z8249 Family history of ischemic heart disease and other diseases of the circulatory system: Secondary | ICD-10-CM | POA: Diagnosis not present

## 2021-08-23 DIAGNOSIS — I2510 Atherosclerotic heart disease of native coronary artery without angina pectoris: Secondary | ICD-10-CM | POA: Diagnosis not present

## 2021-08-23 DIAGNOSIS — J449 Chronic obstructive pulmonary disease, unspecified: Secondary | ICD-10-CM | POA: Diagnosis present

## 2021-08-23 DIAGNOSIS — D62 Acute posthemorrhagic anemia: Secondary | ICD-10-CM | POA: Diagnosis not present

## 2021-08-23 DIAGNOSIS — M5126 Other intervertebral disc displacement, lumbar region: Secondary | ICD-10-CM | POA: Diagnosis not present

## 2021-08-23 DIAGNOSIS — M4326 Fusion of spine, lumbar region: Secondary | ICD-10-CM | POA: Diagnosis not present

## 2021-08-23 DIAGNOSIS — Z90710 Acquired absence of both cervix and uterus: Secondary | ICD-10-CM | POA: Diagnosis not present

## 2021-08-23 DIAGNOSIS — Z961 Presence of intraocular lens: Secondary | ICD-10-CM | POA: Diagnosis present

## 2021-08-23 DIAGNOSIS — M5136 Other intervertebral disc degeneration, lumbar region: Secondary | ICD-10-CM | POA: Diagnosis not present

## 2021-08-23 DIAGNOSIS — J189 Pneumonia, unspecified organism: Secondary | ICD-10-CM | POA: Diagnosis present

## 2021-08-23 DIAGNOSIS — Z885 Allergy status to narcotic agent status: Secondary | ICD-10-CM

## 2021-08-23 DIAGNOSIS — M47816 Spondylosis without myelopathy or radiculopathy, lumbar region: Secondary | ICD-10-CM | POA: Diagnosis not present

## 2021-08-23 DIAGNOSIS — Z8551 Personal history of malignant neoplasm of bladder: Secondary | ICD-10-CM | POA: Diagnosis not present

## 2021-08-23 DIAGNOSIS — E782 Mixed hyperlipidemia: Secondary | ICD-10-CM | POA: Diagnosis present

## 2021-08-23 DIAGNOSIS — M47896 Other spondylosis, lumbar region: Secondary | ICD-10-CM

## 2021-08-23 DIAGNOSIS — I110 Hypertensive heart disease with heart failure: Secondary | ICD-10-CM | POA: Diagnosis not present

## 2021-08-23 DIAGNOSIS — I509 Heart failure, unspecified: Secondary | ICD-10-CM | POA: Diagnosis not present

## 2021-08-23 DIAGNOSIS — Z419 Encounter for procedure for purposes other than remedying health state, unspecified: Secondary | ICD-10-CM

## 2021-08-23 DIAGNOSIS — Z01818 Encounter for other preprocedural examination: Principal | ICD-10-CM

## 2021-08-23 DIAGNOSIS — Z716 Tobacco abuse counseling: Secondary | ICD-10-CM

## 2021-08-23 SURGERY — POSTERIOR LUMBAR FUSION 1 LEVEL
Anesthesia: General

## 2021-08-23 MED ORDER — FENTANYL CITRATE (PF) 100 MCG/2ML IJ SOLN
INTRAMUSCULAR | Status: AC
Start: 2021-08-23 — End: 2021-08-24
  Filled 2021-08-23: qty 2

## 2021-08-23 MED ORDER — PROPOFOL 10 MG/ML IV BOLUS
INTRAVENOUS | Status: AC
  Filled 2021-08-23: qty 20

## 2021-08-23 MED ORDER — GABAPENTIN 100 MG PO CAPS
100.0000 mg | ORAL_CAPSULE | Freq: Three times a day (TID) | ORAL | Status: DC
  Administered 2021-08-23 – 2021-08-26 (×9): 100 mg via ORAL
  Filled 2021-08-23 (×9): qty 1

## 2021-08-23 MED ORDER — ALBUMIN HUMAN 5 % IV SOLN: INTRAVENOUS | Status: DC | PRN

## 2021-08-23 MED ORDER — PANTOPRAZOLE SODIUM 40 MG IV SOLR
40.0000 mg | Freq: Every day | INTRAVENOUS | Status: DC
  Administered 2021-08-23: 40 mg via INTRAVENOUS
  Filled 2021-08-23: qty 10

## 2021-08-23 MED ORDER — ACETAMINOPHEN 500 MG PO TABS: 1000.0000 mg | ORAL_TABLET | Freq: Once | ORAL | Status: DC | PRN

## 2021-08-23 MED ORDER — LIDOCAINE 2% (20 MG/ML) 5 ML SYRINGE
INTRAMUSCULAR | Status: DC | PRN
  Administered 2021-08-23: 60 mg via INTRAVENOUS

## 2021-08-23 MED ORDER — BUPIVACAINE LIPOSOME 1.3 % IJ SUSP
INTRAMUSCULAR | Status: DC | PRN
  Administered 2021-08-23: 8 mL

## 2021-08-23 MED ORDER — ALUM & MAG HYDROXIDE-SIMETH 200-200-20 MG/5ML PO SUSP: 30.0000 mL | Freq: Four times a day (QID) | ORAL | Status: DC | PRN

## 2021-08-23 MED ORDER — SURGIFLO WITH THROMBIN (HEMOSTATIC MATRIX KIT) OPTIME
TOPICAL | Status: DC | PRN
  Administered 2021-08-23: 1 via TOPICAL

## 2021-08-23 MED ORDER — THROMBIN 20000 UNITS EX SOLR
CUTANEOUS | Status: DC | PRN
  Administered 2021-08-23: 20 mL via TOPICAL

## 2021-08-23 MED ORDER — AMLODIPINE BESYLATE 5 MG PO TABS
5.0000 mg | ORAL_TABLET | Freq: Every day | ORAL | Status: DC
  Administered 2021-08-24 – 2021-08-26 (×3): 5 mg via ORAL
  Filled 2021-08-23 (×3): qty 1

## 2021-08-23 MED ORDER — PRAMIPEXOLE DIHYDROCHLORIDE 0.25 MG PO TABS
0.5000 mg | ORAL_TABLET | Freq: Every evening | ORAL | Status: DC | PRN
  Filled 2021-08-23: qty 2

## 2021-08-23 MED ORDER — DOCUSATE SODIUM 100 MG PO CAPS
100.0000 mg | ORAL_CAPSULE | Freq: Two times a day (BID) | ORAL | Status: DC
  Administered 2021-08-23 – 2021-08-26 (×6): 100 mg via ORAL
  Filled 2021-08-23 (×6): qty 1

## 2021-08-23 MED ORDER — ESMOLOL HCL 100 MG/10ML IV SOLN
INTRAVENOUS | Status: DC | PRN
  Administered 2021-08-23: 40 mg via INTRAVENOUS

## 2021-08-23 MED ORDER — SERTRALINE HCL 50 MG PO TABS
50.0000 mg | ORAL_TABLET | Freq: Every day | ORAL | Status: DC
  Administered 2021-08-24 – 2021-08-26 (×3): 50 mg via ORAL
  Filled 2021-08-23 (×3): qty 1

## 2021-08-23 MED ORDER — TRANEXAMIC ACID-NACL 1000-0.7 MG/100ML-% IV SOLN
INTRAVENOUS | Status: DC | PRN
  Administered 2021-08-23: 1000 mg via INTRAVENOUS

## 2021-08-23 MED ORDER — ONDANSETRON HCL 4 MG PO TABS: 4.0000 mg | ORAL_TABLET | Freq: Four times a day (QID) | ORAL | Status: DC | PRN

## 2021-08-23 MED ORDER — ACETAMINOPHEN 500 MG PO TABS
1000.0000 mg | ORAL_TABLET | Freq: Four times a day (QID) | ORAL | Status: AC
  Administered 2021-08-23 – 2021-08-24 (×4): 1000 mg via ORAL
  Filled 2021-08-23 (×4): qty 2

## 2021-08-23 MED ORDER — SODIUM CHLORIDE 0.9% FLUSH: 3.0000 mL | INTRAVENOUS | Status: DC | PRN

## 2021-08-23 MED ORDER — THROMBIN 20000 UNITS EX SOLR
CUTANEOUS | Status: AC
  Filled 2021-08-23: qty 20000

## 2021-08-23 MED ORDER — VITAMIN D 25 MCG (1000 UNIT) PO TABS
5000.0000 [IU] | ORAL_TABLET | Freq: Every day | ORAL | Status: DC
  Administered 2021-08-24 – 2021-08-26 (×3): 5000 [IU] via ORAL
  Filled 2021-08-23 (×3): qty 5

## 2021-08-23 MED ORDER — MIDAZOLAM HCL 2 MG/2ML IJ SOLN
INTRAMUSCULAR | Status: DC | PRN
  Administered 2021-08-23: 1 mg via INTRAVENOUS

## 2021-08-23 MED ORDER — ACETAMINOPHEN 10 MG/ML IV SOLN
1000.0000 mg | Freq: Once | INTRAVENOUS | Status: DC | PRN
  Administered 2021-08-23: 1000 mg via INTRAVENOUS

## 2021-08-23 MED ORDER — SUGAMMADEX SODIUM 200 MG/2ML IV SOLN
INTRAVENOUS | Status: DC | PRN
  Administered 2021-08-23: 200 mg via INTRAVENOUS

## 2021-08-23 MED ORDER — OXYCODONE HCL 5 MG/5ML PO SOLN: 5.0000 mg | Freq: Once | ORAL | Status: DC | PRN

## 2021-08-23 MED ORDER — LACTATED RINGERS IV SOLN: INTRAVENOUS | Status: DC

## 2021-08-23 MED ORDER — CHLORHEXIDINE GLUCONATE 0.12 % MT SOLN
15.0000 mL | Freq: Once | OROMUCOSAL | Status: AC
  Administered 2021-08-23: 15 mL via OROMUCOSAL
  Filled 2021-08-23: qty 15

## 2021-08-23 MED ORDER — HEMOSTATIC AGENTS (NO CHARGE) OPTIME
TOPICAL | Status: DC | PRN
  Administered 2021-08-23: 1 via TOPICAL

## 2021-08-23 MED ORDER — POLYETHYLENE GLYCOL 3350 17 G PO PACK: 17.0000 g | PACK | Freq: Every day | ORAL | Status: DC | PRN

## 2021-08-23 MED ORDER — SODIUM CHLORIDE 0.9% FLUSH
3.0000 mL | Freq: Two times a day (BID) | INTRAVENOUS | Status: DC
  Administered 2021-08-23 – 2021-08-25 (×5): 3 mL via INTRAVENOUS

## 2021-08-23 MED ORDER — LACTATED RINGERS IV SOLN: INTRAVENOUS | Status: DC | PRN

## 2021-08-23 MED ORDER — ROCURONIUM BROMIDE 10 MG/ML (PF) SYRINGE
PREFILLED_SYRINGE | INTRAVENOUS | Status: DC | PRN
  Administered 2021-08-23: 30 mg via INTRAVENOUS
  Administered 2021-08-23: 50 mg via INTRAVENOUS

## 2021-08-23 MED ORDER — BUPIVACAINE HCL (PF) 0.5 % IJ SOLN
INTRAMUSCULAR | Status: AC
  Filled 2021-08-23: qty 30

## 2021-08-23 MED ORDER — EPHEDRINE SULFATE-NACL 50-0.9 MG/10ML-% IV SOSY
PREFILLED_SYRINGE | INTRAVENOUS | Status: DC | PRN
  Administered 2021-08-23: 5 mg via INTRAVENOUS
  Administered 2021-08-23 (×2): 10 mg via INTRAVENOUS

## 2021-08-23 MED ORDER — SODIUM CHLORIDE 0.9 % IV SOLN: INTRAVENOUS | Status: DC

## 2021-08-23 MED ORDER — BUPIVACAINE LIPOSOME 1.3 % IJ SUSP
10.0000 mL | Freq: Once | INTRAMUSCULAR | Status: DC
  Filled 2021-08-23: qty 10

## 2021-08-23 MED ORDER — FENTANYL CITRATE (PF) 250 MCG/5ML IJ SOLN
INTRAMUSCULAR | Status: AC
  Filled 2021-08-23: qty 5

## 2021-08-23 MED ORDER — PROPOFOL 10 MG/ML IV BOLUS
INTRAVENOUS | Status: DC | PRN
  Administered 2021-08-23: 100 mg via INTRAVENOUS

## 2021-08-23 MED ORDER — POLYVINYL ALCOHOL 1.4 % OP SOLN
1.0000 [drp] | Freq: Every day | OPHTHALMIC | Status: DC
  Administered 2021-08-23 – 2021-08-25 (×2): 1 [drp] via OPHTHALMIC
  Filled 2021-08-23: qty 15

## 2021-08-23 MED ORDER — 0.9 % SODIUM CHLORIDE (POUR BTL) OPTIME
TOPICAL | Status: DC | PRN
  Administered 2021-08-23 (×2): 1000 mL

## 2021-08-23 MED ORDER — EPHEDRINE 5 MG/ML INJ
INTRAVENOUS | Status: AC
  Filled 2021-08-23: qty 5

## 2021-08-23 MED ORDER — OXYCODONE HCL 5 MG PO TABS: 10.0000 mg | ORAL_TABLET | ORAL | Status: DC | PRN

## 2021-08-23 MED ORDER — ONDANSETRON HCL 4 MG/2ML IJ SOLN
INTRAMUSCULAR | Status: DC | PRN
  Administered 2021-08-23: 4 mg via INTRAVENOUS

## 2021-08-23 MED ORDER — ROCURONIUM BROMIDE 10 MG/ML (PF) SYRINGE
PREFILLED_SYRINGE | INTRAVENOUS | Status: AC
  Filled 2021-08-23: qty 10

## 2021-08-23 MED ORDER — ACETAMINOPHEN 325 MG PO TABS
650.0000 mg | ORAL_TABLET | ORAL | Status: DC | PRN
  Administered 2021-08-25: 650 mg via ORAL
  Filled 2021-08-23: qty 2

## 2021-08-23 MED ORDER — SODIUM CHLORIDE 0.9 % IV SOLN
250.0000 mL | INTRAVENOUS | Status: DC
  Administered 2021-08-23: 250 mL via INTRAVENOUS

## 2021-08-23 MED ORDER — BUPIVACAINE HCL 0.5 % IJ SOLN
INTRAMUSCULAR | Status: DC | PRN
  Administered 2021-08-23: 8 mL

## 2021-08-23 MED ORDER — ESMOLOL HCL 100 MG/10ML IV SOLN
INTRAVENOUS | Status: AC
  Filled 2021-08-23: qty 10

## 2021-08-23 MED ORDER — OXYCODONE HCL 5 MG PO TABS
5.0000 mg | ORAL_TABLET | ORAL | Status: DC | PRN
  Administered 2021-08-24 – 2021-08-25 (×2): 5 mg via ORAL
  Filled 2021-08-23 (×2): qty 1

## 2021-08-23 MED ORDER — MENTHOL 3 MG MT LOZG: 1.0000 | LOZENGE | OROMUCOSAL | Status: DC | PRN

## 2021-08-23 MED ORDER — FENTANYL CITRATE (PF) 100 MCG/2ML IJ SOLN
25.0000 ug | INTRAMUSCULAR | Status: DC | PRN
  Administered 2021-08-23: 50 ug via INTRAVENOUS

## 2021-08-23 MED ORDER — BISACODYL 5 MG PO TBEC: 5.0000 mg | DELAYED_RELEASE_TABLET | Freq: Every day | ORAL | Status: DC | PRN

## 2021-08-23 MED ORDER — MIDAZOLAM HCL 2 MG/2ML IJ SOLN
INTRAMUSCULAR | Status: AC
  Filled 2021-08-23: qty 2

## 2021-08-23 MED ORDER — CEFAZOLIN SODIUM-DEXTROSE 2-4 GM/100ML-% IV SOLN
2.0000 g | INTRAVENOUS | Status: AC
  Administered 2021-08-23: 2 g via INTRAVENOUS
  Filled 2021-08-23: qty 100

## 2021-08-23 MED ORDER — BUPIVACAINE LIPOSOME 1.3 % IJ SUSP
INTRAMUSCULAR | Status: AC
  Filled 2021-08-23: qty 20

## 2021-08-23 MED ORDER — PHENOL 1.4 % MT LIQD: 1.0000 | OROMUCOSAL | Status: DC | PRN

## 2021-08-23 MED ORDER — PRAVASTATIN SODIUM 40 MG PO TABS
40.0000 mg | ORAL_TABLET | Freq: Every day | ORAL | Status: DC
  Administered 2021-08-24 – 2021-08-26 (×3): 40 mg via ORAL
  Filled 2021-08-23 (×3): qty 1

## 2021-08-23 MED ORDER — ACETAMINOPHEN 650 MG RE SUPP: 650.0000 mg | RECTAL | Status: DC | PRN

## 2021-08-23 MED ORDER — ACETAMINOPHEN 160 MG/5ML PO SOLN: 1000.0000 mg | Freq: Once | ORAL | Status: DC | PRN

## 2021-08-23 MED ORDER — ACETAMINOPHEN 10 MG/ML IV SOLN
INTRAVENOUS | Status: AC
Start: 2021-08-23 — End: 2021-08-24
  Filled 2021-08-23: qty 100

## 2021-08-23 MED ORDER — HYDROMORPHONE HCL 1 MG/ML IJ SOLN
0.5000 mg | INTRAMUSCULAR | Status: DC | PRN
  Administered 2021-08-23: 0.5 mg via INTRAVENOUS
  Filled 2021-08-23: qty 0.5

## 2021-08-23 MED ORDER — LEVOTHYROXINE SODIUM 88 MCG PO TABS
88.0000 ug | ORAL_TABLET | Freq: Every day | ORAL | Status: DC
  Administered 2021-08-24 – 2021-08-26 (×3): 88 ug via ORAL
  Filled 2021-08-23 (×3): qty 1

## 2021-08-23 MED ORDER — CEFAZOLIN SODIUM-DEXTROSE 2-4 GM/100ML-% IV SOLN
2.0000 g | Freq: Three times a day (TID) | INTRAVENOUS | Status: AC
  Administered 2021-08-23 (×2): 2 g via INTRAVENOUS
  Filled 2021-08-23 (×2): qty 100

## 2021-08-23 MED ORDER — FLEET ENEMA 7-19 GM/118ML RE ENEM: 1.0000 | ENEMA | Freq: Once | RECTAL | Status: DC | PRN

## 2021-08-23 MED ORDER — OXYCODONE HCL 5 MG PO TABS: 5.0000 mg | ORAL_TABLET | Freq: Once | ORAL | Status: DC | PRN

## 2021-08-23 MED ORDER — ONDANSETRON HCL 4 MG/2ML IJ SOLN: 4.0000 mg | Freq: Four times a day (QID) | INTRAMUSCULAR | Status: DC | PRN

## 2021-08-23 MED ORDER — FENTANYL CITRATE (PF) 250 MCG/5ML IJ SOLN
INTRAMUSCULAR | Status: DC | PRN
  Administered 2021-08-23 (×2): 50 ug via INTRAVENOUS

## 2021-08-23 MED ORDER — ORAL CARE MOUTH RINSE: 15.0000 mL | Freq: Once | OROMUCOSAL | Status: AC

## 2021-08-23 MED ORDER — ONDANSETRON HCL 4 MG/2ML IJ SOLN
INTRAMUSCULAR | Status: AC
  Filled 2021-08-23: qty 2

## 2021-08-23 MED ORDER — PHENYLEPHRINE HCL-NACL 20-0.9 MG/250ML-% IV SOLN
INTRAVENOUS | Status: DC | PRN
  Administered 2021-08-23: 15 ug/min via INTRAVENOUS
  Administered 2021-08-23: 25 ug/min via INTRAVENOUS

## 2021-08-23 MED ORDER — OXYCODONE HCL ER 10 MG PO T12A
10.0000 mg | EXTENDED_RELEASE_TABLET | Freq: Two times a day (BID) | ORAL | Status: DC
  Administered 2021-08-23 – 2021-08-26 (×7): 10 mg via ORAL
  Filled 2021-08-23 (×7): qty 1

## 2021-08-23 SURGICAL SUPPLY — 74 items
BAG COUNTER SPONGE SURGICOUNT (BAG) ×3 IMPLANT
BLADE CLIPPER SURG (BLADE) IMPLANT
BONE VIVIGEN FORMABLE 10CC (Bone Implant) ×2 IMPLANT
BUR MATCHSTICK NEURO 3.0 LAGG (BURR) ×2 IMPLANT
BUR SABER RD CUTTING 3.0 (BURR) IMPLANT
CAGE SABLE 10X26 6-12 8D (Cage) ×1 IMPLANT
COVER BACK TABLE 80X110 HD (DRAPES) ×2 IMPLANT
COVER SURGICAL LIGHT HANDLE (MISCELLANEOUS) ×1 IMPLANT
DECANTER SPIKE VIAL GLASS SM (MISCELLANEOUS) ×1 IMPLANT
DERMABOND ADVANCED (GAUZE/BANDAGES/DRESSINGS) ×1
DERMABOND ADVANCED .7 DNX12 (GAUZE/BANDAGES/DRESSINGS) ×1 IMPLANT
DRAPE C-ARM 42X72 X-RAY (DRAPES) ×2 IMPLANT
DRAPE C-ARMOR (DRAPES) ×2 IMPLANT
DRAPE MICROSCOPE LEICA (MISCELLANEOUS) ×1 IMPLANT
DRAPE POUCH INSTRU U-SHP 10X18 (DRAPES) ×2 IMPLANT
DRAPE SURG 17X23 STRL (DRAPES) ×4 IMPLANT
DRSG MEPILEX BORDER 4X4 (GAUZE/BANDAGES/DRESSINGS) IMPLANT
DRSG MEPILEX BORDER 4X8 (GAUZE/BANDAGES/DRESSINGS) ×1 IMPLANT
DURAPREP 26ML APPLICATOR (WOUND CARE) ×2 IMPLANT
ELECT BLADE 4.0 EZ CLEAN MEGAD (MISCELLANEOUS) ×2
ELECT BLADE 6.5 EXT (BLADE) IMPLANT
ELECT CAUTERY BLADE 6.4 (BLADE) ×2 IMPLANT
ELECT REM PT RETURN 9FT ADLT (ELECTROSURGICAL) ×2
ELECTRODE BLDE 4.0 EZ CLN MEGD (MISCELLANEOUS) ×1 IMPLANT
ELECTRODE REM PT RTRN 9FT ADLT (ELECTROSURGICAL) ×1 IMPLANT
EVACUATOR 1/8 PVC DRAIN (DRAIN) IMPLANT
GAUZE SPONGE 4X4 12PLY STRL (GAUZE/BANDAGES/DRESSINGS) ×1 IMPLANT
GLOVE SRG 8 PF TXTR STRL LF DI (GLOVE) ×1 IMPLANT
GLOVE SURG 8.5 LATEX PF (GLOVE) ×2 IMPLANT
GLOVE SURG LTX SZ9 (GLOVE) ×2 IMPLANT
GLOVE SURG ORTHO LTX SZ7.5 (GLOVE) ×3 IMPLANT
GLOVE SURG UNDER POLY LF SZ8 (GLOVE) ×4
GOWN STRL REUS W/ TWL LRG LVL3 (GOWN DISPOSABLE) ×1 IMPLANT
GOWN STRL REUS W/TWL 2XL LVL3 (GOWN DISPOSABLE) ×4 IMPLANT
GOWN STRL REUS W/TWL LRG LVL3 (GOWN DISPOSABLE) ×2
GRAFT BNE MATRIX VG FRMBL L 10 (Bone Implant) IMPLANT
KIT BASIN OR (CUSTOM PROCEDURE TRAY) ×2 IMPLANT
KIT POSITION SURG JACKSON T1 (MISCELLANEOUS) ×2 IMPLANT
KIT TURNOVER KIT B (KITS) ×2 IMPLANT
NDL SPNL 18GX3.5 QUINCKE PK (NEEDLE) ×1 IMPLANT
NEEDLE 22X1 1/2 (OR ONLY) (NEEDLE) ×2 IMPLANT
NEEDLE SPNL 18GX3.5 QUINCKE PK (NEEDLE) ×2 IMPLANT
NS IRRIG 1000ML POUR BTL (IV SOLUTION) ×3 IMPLANT
PACK LAMINECTOMY ORTHO (CUSTOM PROCEDURE TRAY) ×2 IMPLANT
PAD ARMBOARD 7.5X6 YLW CONV (MISCELLANEOUS) ×3 IMPLANT
PATTIES SURGICAL .75X.75 (GAUZE/BANDAGES/DRESSINGS) ×2 IMPLANT
PATTIES SURGICAL 1X1 (DISPOSABLE) ×2 IMPLANT
ROD 40MM SPINAL (Rod) ×1 IMPLANT
ROD CREO 45MM SPINAL (Rod) ×1 IMPLANT
RUBBER BAND PULSE STRL (MISCELLANEOUS) ×2 IMPLANT
SCREW MOD CREO 5.5X45 (Screw) ×4 IMPLANT
SEALANT ADHERUS EXTEND TIP (MISCELLANEOUS) ×1 IMPLANT
SPOGE SURGIFLO 8M (HEMOSTASIS) ×2
SPONGE SURGIFLO 8M (HEMOSTASIS) IMPLANT
SPONGE SURGIFOAM ABS GEL 100 (HEMOSTASIS) ×2 IMPLANT
STAPLER SKIN PROX WIDE 3.9 (STAPLE) ×1 IMPLANT
SUT NURALON 4 0 TR CR/8 (SUTURE) ×1 IMPLANT
SUT VIC AB 0 CT1 27 (SUTURE) ×2
SUT VIC AB 0 CT1 27XBRD ANBCTR (SUTURE) ×1 IMPLANT
SUT VIC AB 1 CTX 36 (SUTURE) ×4
SUT VIC AB 1 CTX36XBRD ANBCTR (SUTURE) ×2 IMPLANT
SUT VIC AB 2-0 CT1 27 (SUTURE) ×2
SUT VIC AB 2-0 CT1 TAPERPNT 27 (SUTURE) ×1 IMPLANT
SUT VIC AB 3-0 X1 27 (SUTURE) ×2 IMPLANT
SYR 20ML LL LF (SYRINGE) ×2 IMPLANT
SYR CONTROL 10ML LL (SYRINGE) ×4 IMPLANT
TOWEL GREEN STERILE (TOWEL DISPOSABLE) ×2 IMPLANT
TOWEL GREEN STERILE FF (TOWEL DISPOSABLE) ×2 IMPLANT
TRAY FOLEY MTR SLVR 14FR STAT (SET/KITS/TRAYS/PACK) ×1 IMPLANT
TRAY FOLEY MTR SLVR 16FR STAT (SET/KITS/TRAYS/PACK) ×1 IMPLANT
TUBING BULK SUCTION (MISCELLANEOUS) ×1 IMPLANT
TULIP CREP AMP 5.5MM (Orthopedic Implant) ×4 IMPLANT
WATER STERILE IRR 1000ML POUR (IV SOLUTION) ×2 IMPLANT
YANKAUER SUCT BULB TIP NO VENT (SUCTIONS) ×2 IMPLANT

## 2021-08-23 NOTE — Brief Op Note (Addendum)
08/23/2021 ? ?11:43 AM ? ?PATIENT:  Ana Santos  81 y.o. female ? ?PRE-OPERATIVE DIAGNOSIS:  LUMBAR ONE-TWO LUMBAR SPONDYLOSIS, SCOLIOSIS, DEGENERATIVE DISC DISEASE, herniated nucleus pulposus ? ?POST-OPERATIVE DIAGNOSIS:  LUMBAR ONE-TWO LUMBAR SPONDYLOSIS, SCOLIOSIS, DEGENERATIVE DISC DISEASE, herniated nucleus  ? ?PROCEDURE:  Procedure(s): ?LEFT LUMBBAR ONE-TWOTRANSFORAMINAL LUMBAR INTERBODY FUSION WITH GLOBUS PEDICLE SCREWS, RODS AND SABLE ADJUSTABLE CAGE, LOCAL BONE GRAFT, ALLOGRAFT BONE GRAFT, VIVIGEN (N/A) ? ?SURGEON:  Surgeon(s) and Role: ?   * Jessy Oto, MD - Primary ? ?PHYSICIAN ASSISTANT: Benjiman Core, PA-c ? ?ANESTHESIA:   local and general ? ?EBL:  200 mL  ? ?BLOOD ADMINISTERED:none and 0 CC CELLSAVER ? ?DRAINS: Urinary Catheter (Foley)  ? ?LOCAL MEDICATIONS USED:  MARCAINE 0.5% 1:1 EXPAREL 1.3% Amount: 16 ml ? ?COMPLICATION:Left axillary 77m tear dura, repaired with single suture and duraseal. ? ?SPECIMEN:  No Specimen ? ?DISPOSITION OF SPECIMEN:  N/A ? ?COUNTS:  YES ? ?TOURNIQUET:  * No tourniquets in log * ? ?DICTATION: .Dragon Dictation ? ?PLAN OF CARE: Admit for overnight observation ? ?PATIENT DISPOSITION:  PACU - hemodynamically stable. ?  ?Delay start of Pharmacological VTE agent (>24hrs) due to surgical blood loss or risk of bleeding: yes ? ?

## 2021-08-23 NOTE — Progress Notes (Signed)
Orthopedic Tech Progress Note ?Patient Details:  ?Ana Santos ?1941/03/24 ?[id number] ? ?Ortho Devices ?Type of Ortho Device: Lumbar corsett ?Ortho Device/Splint Location: BACK ?Ortho Device/Splint Interventions: Ordered ?  ?Post Interventions ?Patient Tolerated: Well ?Instructions Provided: Care of device ? ?Janit Pagan ?08/23/2021, 3:47 PM ? ?

## 2021-08-23 NOTE — H&P (Signed)
LARISHA VENCILL is an 81 y.o. female.   ?Chief Complaint: Back pain and lower extremity radiculopathy ?HPI: 81 year old white female with history of L1-2 HNP/stenosis comes in for prep evaluation.  Symptoms unchanged from previous visit.  She is wanting to proceed with LEFT L1-2 TRANSFORAMINAL LUMBAR INTERBODY FUSION WITH GLOBUS PEDICLE SCREWS, RODS AND SABLE ADJUSTABLE CAGE, LOCAL BONE GRAFT, ALLOGRAFT BONE GRAFT, VIVIGEN as scheduled.  Today history and physical performed.  Review of systems negative.  Patient does have a history of basal cell or artery aneurysm and is followed by Dr. Arlester Marker for this.  Surgery scheduler advised me that Dr. Gena Fray did give his recommendations on when to stop Plavix. ? ?Past Medical History:  ?Diagnosis Date  ? AN (acoustic neuroma) ([place])   ? Aneurysm ([place])   ? 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 Anxiety   ? Bladder cancer ([place])   ? Cataract   ? Cerebrovascular disease   ? Cigarette smoker   ? COPD (chronic obstructive pulmonary disease) ([place])   ? mild, no inhalers or oxygen used  ? Coronary artery disease   ? no current cardiologist  pt. denies  ? Deafness in right ear   ? Depression   ? Headache   ? tension headaches due to aneurysm repair with stent  ? Heart murmur   ? History of kidney stones   ? 15 to 20 yrs ago  ? Hyperlipidemia   ? Hypertension   ? Hypothyroidism   ? IBS (irritable bowel syndrome)   ? Mitral valve prolapse   ? mild takes beta blocker  ? Osteopenia   ? Palpitations   ? Stroke [facility])   ? x 3 ministrokes last one feb 2014  ? Venous insufficiency   ? Vitamin D deficiency   ? ? ?Past Surgical History:  ?Procedure Laterality Date  ? ANEURYSM COILING  04/2009  ? Dr. Estanislado Pandy  ? APPENDECTOMY    ? BACK SURGERY  02/28/2021  ? BREAST LUMPECTOMY Right 1986  ? benign  ? DIAGNOSTIC LAPAROSCOPY    ? Proable laparoscopic right colectomy 03-14-18 Dr. Excell Seltzer  ? EYE SURGERY Bilateral   ? ioc lens for cataracts  ? LAPAROSCOPIC RIGHT COLECTOMY Right  03/14/2018  ? Procedure: LAPAROSCOPIC ASSISTED  RIGHT COLECTOMY;  Surgeon: Excell Seltzer, MD;  [facility];  Service: General;  Laterality: Right;  ? LAPAROSCOPY N/A 03/14/2018  ? Procedure: LAPAROSCOPY;  Surgeon: Excell Seltzer, MD;  [facility];  Service: General;  Laterality: N/A;  ? LEFT HEART CATHETERIZATION WITH CORONARY ANGIOGRAM N/A 08/05/2014  ? Procedure: LEFT HEART CATHETERIZATION WITH CORONARY ANGIOGRAM;  Surgeon: Wellington Hampshire, MD;  Location: [facility];  Service: Cardiovascular;  Laterality: N/A;  ? LUMBAR LAMINECTOMY N/A 02/28/2021  ? Procedure: LEFT L1-L2 MICRODISCECTOMY AND FASCIECTOMY;  Surgeon: Marybelle Killings, MD;  Location: [facility];  Service: Orthopedics;  Laterality: N/A;  ? TONSILLECTOMY    ? TOTAL ABDOMINAL HYSTERECTOMY  1970  ? complete  ? TRANSLABYRINTHINE PROCEDURE  2002  ? [facility] Dr. Vicie Mutters tumor removal  ? TRANSURETHRAL RESECTION OF BLADDER TUMOR N/A 02/08/2018  ? Procedure: TRANSURETHRAL RESECTION OF BLADDER TUMOR (TURBT)/ POSTOPERATIVE INSTILLATION OF CHEMO THERAPY;  Surgeon: Festus Aloe, MD;  Location[facility];  Service: Urology;  Laterality: N/A;  ? ? ?Family History  ?Problem Relation Age of Onset  ? Heart disease Mother   ? Cancer Mother   ?     Lung  ? Heart attack Mother   ?  Diabetes Mother   ? Cancer Father   ?     Lung  ? Diabetes Sister   ? Cancer Sister   ?     unkown type  ? Diabetes Brother   ? Lung cancer Brother   ?     Lung  ? AAA (abdominal aortic aneurysm) Neg Hx   ? Colon cancer Neg Hx   ? Stomach cancer Neg Hx   ? Esophageal cancer Neg Hx   ? Liver cancer Neg Hx   ? Pancreatic cancer Neg Hx   ? Rectal cancer Neg Hx   ? ?Social History:  reports that she has been smoking cigarettes. She has a 12.50 pack-year smoking history. She has never used smokeless tobacco. She reports that she does not drink alcohol and does not use drugs. ? ?Allergies:  ?Allergies  ?Allergen Reactions  ? Prednisone Other (See Comments)  ?  Hallucinations   ? Atorvastatin Other (See Comments)  ?   Lipitor caused arm pain (3/09)  ? Hydrocodone-Acetaminophen Other (See Comments)  ?  hallucinations  ? Morphine Other (See Comments)  ?  hallucinations  ? Nortriptyline Nausea And Vomiting  ?  Caused nausea and vomiting and shaking, kept her up all night  ? Topamax [Topiramate] Nausea And Vomiting  ?  *dizziness*  ? Tramadol Other (See Comments)  ?  withdrawal  ? ? ?No medications prior to admission.  ? ? ?No results found for this or any previous visit (from the past 48 hour(s)). ?No results found. ? ?Review of Systems  ?Constitutional:  Positive for activity change.  ?HENT: Negative.    ?Respiratory: Negative.    ?Cardiovascular: Negative.   ?Gastrointestinal: Negative.   ?Genitourinary: Negative.   ?Musculoskeletal:  Positive for back pain and gait problem.  ?Neurological:  Positive for numbness.  ?Psychiatric/Behavioral: Negative.    ? ?There were no vitals taken for this visit. ?Physical Exam ?HENT:  ?   Head: Normocephalic and atraumatic.  ?   Nose: Nose normal.  ?Cardiovascular:  ?   Rate and Rhythm: Regular rhythm.  ?   Heart sounds: Normal heart sounds.  ?Pulmonary:  ?   Effort: Pulmonary effort is normal. No respiratory distress.  ?   Breath sounds: Normal breath sounds.  ?Neurological:  ?   Mental Status: She is alert and oriented to person, place, and time.  ?  ? ?Assessment/Plan ?Left L1-2 HNP/stenosis ? ? ?We will proceed with LEFT L1-2 TRANSFORAMINAL LUMBAR INTERBODY FUSION WITH GLOBUS PEDICLE SCREWS, RODS AND SABLE ADJUSTABLE CAGE, LOCAL BONE GRAFT, ALLOGRAFT BONE GRAFT, VIVIGEN as scheduled.  Surgical procedure discussed along with potential recovery time.  All questions answered. ? ? ? ?Benjiman Core, PA-C ?08/23/2021, 12:54 AM ? ? ? ?

## 2021-08-23 NOTE — Discharge Instructions (Addendum)
? ?  Call if there is increasing drainage, fever greater than 101.5, severe head aches, and ?worsening nausea or light sensitivity. ?If shortness of breath, bloody cough or chest tightness or pain go to an emergency room. ?No lifting greater than 10 lbs. ?Avoid bending, stooping and twisting. ?Use brace when sitting and out of bed even to go to bathroom. ?Walk in house for first 2 weeks then may start to get out slowly increasing distances up to one mile by 4-6 weeks post op. ?After 5 days may shower and change dressing following bathing with shower.When ?bathing remove the brace shower and replace brace before getting out of the shower. ?If drainage, keep dry dressing and do not bathe the incision, use an moisture impervious dressing. ?Please call and return for scheduled follow up appointment 2 weeks from the time of surgery. ?Please schedule an appointment to see your primary care MD concerning COPD and a post operative pneumonia and anemia ?For care of these medical conditions. ? ?

## 2021-08-23 NOTE — Transfer of Care (Signed)
Immediate Anesthesia Transfer of Care Note ? ?Patient: Ana Santos ? ?Procedure(s) Performed: LEFT LUMBBAR ONE-TWOTRANSFORAMINAL LUMBAR INTERBODY FUSION WITH GLOBUS PEDICLE SCREWS, RODS AND SABLE ADJUSTABLE CAGE, LOCAL BONE GRAFT, ALLOGRAFT BONE GRAFT, VIVIGEN ? ?Patient Location: PACU ? ?Anesthesia Type:General ? ?Level of Consciousness: awake, patient cooperative and responds to stimulation ? ?Airway & Oxygen Therapy: Patient Spontanous Breathing and Patient connected to nasal cannula oxygen ? ?Post-op Assessment: Report given to RN, Post -op Vital signs reviewed and stable and Patient moving all extremities X 4 ? ?Post vital signs: Reviewed and stable ? ?Last Vitals:  ?Vitals Value Taken Time  ?BP 120/58 08/23/21 1136  ?Temp    ?Pulse 73 08/23/21 1137  ?Resp 21 08/23/21 1137  ?SpO2 99 % 08/23/21 1137  ?Vitals shown include unvalidated device data. ? ?Last Pain:  ?Vitals:  ? 08/23/21 0623  ?TempSrc:   ?PainSc: 7   ?   ? ?Patients Stated Pain Goal: 4 (08/23/21 6712) ? ?Complications: No notable events documented. ?

## 2021-08-23 NOTE — TOC Initial Note (Signed)
Transition of Care (TOC) - Initial/Assessment Note  ? ? ?Patient Details  ?Name: Ana Santos ?MRN: [id number] ?Date of Birth: Sep 07, 1940 ? ?Transition of Care (TOC) CM/SW Contact:    ?Angelita Ingles, RN ?Phone Number:[phone] ? ?08/23/2021, 3:18 PM ? ?Clinical Narrative:                 ? ?Transition of Care (TOC) Screening Note ? ? ?Patient Details  ?Name: Ana Santos ?Date of Birth: Mar 06, 1941 ? ? ?Transition of Care (TOC) CM/SW Contact:    ?Angelita Ingles, RN ?Phone Number: ?08/23/2021, 3:20 PM ? ? ? ?Transition of Care Department [facility]) has reviewed patient and no TOC needs have been identified at this time. We will continue to monitor patient advancement through interdisciplinary progression rounds.  ? ? ? ?  ?  ? ? ?Patient Goals and CMS Choice ?  ?  ?  ? ?Expected Discharge Plan and Services ?  ?  ?  ?  ?  ?                ?  ?  ?  ?  ?  ?  ?  ?  ?  ?  ? ?Prior Living Arrangements/Services ?  ?  ?  ?       ?  ?  ?  ?  ? ?Activities of Daily Living ?  ?  ? ?Permission Sought/Granted ?  ?  ?   ?   ?   ?   ? ?Emotional Assessment ?  ?  ?  ?  ?  ?  ? ?Admission diagnosis:  Fusion of spine of lumbar region [M43.26] ?Patient Active Problem List  ? Diagnosis Date Noted  ? Other secondary scoliosis, lumbar region 08/23/2021  ?  Class: Chronic  ? Fusion of spine of lumbar region 08/23/2021  ? Lumbar post-laminectomy syndrome 06/15/2021  ? Status post lumbar microdiscectomy 03/09/2021  ? Recurrent herniation of lumbar disc 02/28/2021  ? Metatarsal stress fracture of left foot 02/10/2021  ? Herniated intervertebral disc of lumbar spine 01/26/2021  ? Foraminal stenosis due to intervertebral disc disease 01/21/2021  ? History of bladder cancer 01/21/2021  ? Labyrinthitis 12/08/2019  ? Left-sided low back pain without sciatica 05/05/2019  ? Visual loss, transient, bilateral 02/21/2019  ? Essential hypertension 02/21/2019  ? Colonic mass s/p lap ileocectomy 03/14/2018 03/14/2018  ? Acute lower GI bleeding 03/14/2018   ? Chronic anticoagulation 03/14/2018  ? Chronic tension-type headache, not intractable 05/31/2015  ? 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 ([place]) 03/11/2014  ? 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 RLS (restless legs syndrome) 07/04/2012  ? History of cerebral aneurysm 03/26/2009  ? Vitamin D deficiency 08/10/2008  ? Venous insufficiency 08/10/2008  ? Disorder of bone and cartilage 12/18/2007  ? History of acoustic neuroma 08/18/2007  ? Hypothyroidism 08/18/2007  ? Tobacco use disorder, continuous 08/18/2007  ? Mixed hyperlipidemia 07/18/2007  ? Anxiety with depression 07/18/2007  ? COPD (chronic obstructive pulmonary disease) with chronic bronchitis ([place]) 07/18/2007  ? Fibromyalgia 07/18/2007  ? ?PCP:  Haydee Salter, MD ?Pharmacy:   ?[facility] [place] ?[address] ?[place] [address] ?Phone: [phone] Fax: [phone] ? ?[place] [place] ?[place] ?[place] [address] ?Phone: [phone] Fax: [phone] ? ? ? ? ?Social Determinants of Health (SDOH) Interventions ?  ? ?Readmission Risk  Interventions ?No flowsheet data found. ? ? ?

## 2021-08-23 NOTE — Op Note (Signed)
08/23/2021 ? ?11:46 AM ? ?PATIENT:  Ana Santos  81 y.o. female ? ?MRN: [id number] ? ?OPERATIVE REPORT ? ?PRE-OPERATIVE DIAGNOSIS:  LUMBAR ONE-TWO LUMBAR SPONDYLOSIS, SCOLIOSIS, DEGENERATIVE DISC DISEASE, herniated nucleus pulposus ? ?POST-OPERATIVE DIAGNOSIS:  LUMBAR ONE-TWO LUMBAR SPONDYLOSIS, SCOLIOSIS, DEGENERATIVE DISC DISEASE, herniated nucleus  ? ?PROCEDURE:  Procedure(s): ?LEFT LUMBBAR ONE-TWOTRANSFORAMINAL LUMBAR INTERBODY FUSION WITH GLOBUS PEDICLE SCREWS, RODS AND SABLE ADJUSTABLE CAGE, LOCAL BONE GRAFT, ALLOGRAFT BONE GRAFT, VIVIGEN ?   ?SURGEON:  Jessy Oto, MD  ?   ?ASSISTANT:  Benjiman Core, PA-C  (Present throughout the entire procedure and necessary for completion of procedure in a timely manner)  ?   ?ANESTHESIA:  General, supplemented with local marcaine 0.5% 1:1 exparel 1.3% total 16 cc ?   ?COMPLICATIONS:  Right axillary L1 dural tear with single suture and duraseal. ?   ?COMPONENTS:  ?Implant Name Type Inv. Item Serial No. Manufacturer Lot No. LRB No. Used Action  ?BONE VIVIGEN FORMABLE 10CC - [id number] Bone Implant BONE [employer] FORMABLE 10CC [id number] [employer]  N/A 1 Implanted  ?Sable implant spacer    [employer] [id number] N/A 1 Implanted  ?Creo One Screw- 5.5x45 Screw   [employer]  N/A 4 Implanted  ?TULIP CREP AMP 5.5MM - [id number] Orthopedic Implant TULIP CREP AMP 5.5MM  [employer]  N/A 4 Implanted  ?ROD 40MM SPINAL - [id number] Rod ROD 40MM SPINAL  [employer]  N/A 1 Implanted  ?ROD CREO 45MM SPINAL - [id number] Rod ROD CREO 45MM SPINAL  [employer]  N/A 1 Implanted  ?  ? ?PROCEDURE: ?The patient was met in the holding area, and the appropriate lumbar levels left  L1-2 identified and marked with an "X" and my initials. I had discussion with the patient in the preop holding area regarding a change of consent form.The fusion level was reidentified as  L1-2.Patient understands the rationale to perform TLIF at L1-2 level to decompress the left L1-2 lateral  recess, remove recurrent disc herniation and decompress left L1-2 foramen. ?The patient was then transported to OR and was placed under general anestheticwithout difficulty. The patient received appropriate preoperative antibiotic prophylaxis ancef 1 gram.  Nursing staff inserted a Foley catheter under sterile conditions. The patient was then turned to a prone position using the [place] spine frame. PAS. all pressure points well padded the arms at the side to 90? 90?. Standard prep with DuraPrep solution draped in the usual manner from the lower dorsal spine the mid sacral segment. Iodine Vi-Drape was used and the old incision scar was marked. Time-out procedure was called and correct. Skin in the midline between T12 and L2 was then infiltrated with local anesthesia, marcaine 1/2% 1:1 exparel 1.3% total 16 cc used. Incision was then made  extending from T12 to L2  through the skin and subcutaneous layers down to the patient's lumbodorsal fascia and spinous processes. The incision then carried sharply excising the old incision scar then continuing the lateral aspect of the spinous processes of L1 and L2. Cobb elevator used to carefully elevate the paralumbar muscles off of the posterior elements using electrocautery carefully drilled bleeding and perform dissection of the muscle tissues of the preserving the facet capsule at the T12-L1. Continuing the exposure out laterally to expose the lateral margin of the facet joint line at T12-L1 and L1-2.  ?Clamps were then placed at the  L2 interspinous process level observed on lateral radiograph the upper clamp was beneath the L2 spinous process. Spinous processes of L2 marked with  OR marking pen sterilely synthes retaining retractor was used for the upper part of the incision. ?C-arm fluoroscopy was then brought into the field and using C-arm fluoroscopy then a hole made into the medial aspect of the pedicle of L2 using a high speed burr observed in the pedicle using C  arm at the 5 oclock position on the left L2 pedicle nerve probe initial entry was determined on fluoroscopy to be good position alignment so that a thoracic pedicle probe was passed to 45 mm within the left L2 pedicle  observed on C-arm fluoroscopy to be beyond the midpoint of the lumbar vertebra and then position alignment within the left L2 pedicle this was then removed and the pedicle channel probed demonstrating patency no sign of rupture the cortex of the pedicle. Tapping with a 4.5 mm screw tap then  a 5.5 mm x 45 mm screw shank was placed on the left side pedicle at the L2 level. C-arm fluoroscopy was then brought into the field and using C-arm fluoroscopy then a hole made into the posterior medial aspect of the pedicle of right L2 observed in the pedicle using ball tipped nerve hook and hockey stick nerve probe initial entry was determined on fluoroscopy to be good position alignment so that thoracic pedicle probe was then used to sound the right L2 pedicle to a depth of nearly 45 mm observed on C-arm fluoroscopy to be beyond the midpoint of the lumbar vertebra and then position alignment within the right L3 pedicle this was then removed and the pedicle channel probed demonstrating patency no sign of rupture the cortex of the pedicle. Tapping with a 4.5 mm screw tap then a 5.5 mm x 45 mm screw was saved for later placement after decompression and TLIF. C-arm fluoroscopy was then brought into the field and using C-arm fluoroscopy then a hole made into the posterior and medial aspect of the left pedicle of L1 observed in the pedicle using ball tipped nerve hook and hockey stick nerve probe initial entry was determined on fluoroscopy to be good position alignment so that a thoracic pedicle probe was then used to tap the left L1 pedicle to a depth of nearly 45 mm observed on C-arm fluoroscopy to be beyond the posterior one third of the lumbar vertebra and good position alignment within the left L1 pedicle this  was then removed and the pedicle channel probed demonstrating patency no sign of rupture the cortex of the pedicle. Tapping with a 4.5 mm screw tap then a 5.5 mm x 45 mm screw shank was placed on the left side at the [place]. The pedicle channel of L1 on the left probed demonstrating patency no sign of rupture the cortex of the pedicle.C-arm fluoroscopy was then brought into the field and using C-arm fluoroscopy then a hole made into the posterior and medial aspect of the right pedicle of L1 observed in the pedicle using ball tipped nerve hook and hockey stick nerve probe initial entry was determined on fluoroscopy to be good position alignment so that a thoracic pedicle probe was then used to tap the right L1 pedicle to a depth of nearly 45 mm observed on C-arm fluoroscopy to be beyond the posterior one third of the lumbar vertebra and good position alignment within the right L1 pedicle this was then removed and the pedicle channel probed demonstrating patency no sign of rupture the cortex of the pedicle. Tapping with a 4.5 mm screw tap then a 5.5 mm x 45 mm  screw was placed on the right side at the L1 level. The pedicle channel of L1 on the right probed demonstrating patency no sign of rupture the cortex of the pedicle. Creo screw for fixation of the left L1 and L2 level was with 5.5 mm x 40 mm screw shanks the t[place]ips for these reserved for insertion after decompression and TLIF.  Flow Seal was used for hemostasis as well as thrombin soaked gel foam and small cottonoids. ? ?Spinous processes of  L1 inferior 60% of L2were then resected down to the base the lamina at each segment.  Leksell rongeur used to resect inferior aspect of the lamina on the left side at the L1 level and partially on the left side at L2 The left medial 40% of the facets of L1-2 were resected in order to decompress the left and then osteotomes and 11mm and 54mm kerrisons were used to perform left L1-2 decompression with complete facetectomy was  perform on the left at L1-2 to provide for exposure of the left side L1-2 neuroforamen for ease of placement of TLIF (transforaminal lumbar interbody fusion) at the L1-2 level inferior portions of the lamina

## 2021-08-23 NOTE — Anesthesia Procedure Notes (Signed)
Procedure Name: Intubation ?Date/Time: 08/23/2021 7:55 AM ?Performed by: Michele Rockers, CRNA ?Pre-anesthesia Checklist: Patient identified, Patient being monitored, Timeout performed, Emergency Drugs available and Suction available ?Patient Re-evaluated:Patient Re-evaluated prior to induction ?Oxygen Delivery Method: Circle System Utilized ?Preoxygenation: Pre-oxygenation with 100% oxygen ?Induction Type: IV induction ?Ventilation: Mask ventilation without difficulty ?Laryngoscope Size: Sabra Heck and 2 ?Grade View: Grade I ?Tube type: Oral ?Tube size: 7.0 mm ?Number of attempts: 1 ?Airway Equipment and Method: Stylet ?Placement Confirmation: ETT inserted through vocal cords under direct vision, positive ETCO2 and breath sounds checked- equal and bilateral ?Secured at: 21 cm ?Tube secured with: Tape ?Dental Injury: Teeth and Oropharynx as per pre-operative assessment  ? ? ? ? ?

## 2021-08-23 NOTE — Interval H&P Note (Signed)
History and Physical Interval Note: ? ?08/23/2021 ?7:35 AM ? ?Ana Santos  has presented today for surgery, with the diagnosis of L1-2 lumbar spondylosis, scoliosis, degenerative disc disease, herniated nucleus pulposus.  The various methods of treatment have been discussed with the patient and family. After consideration of risks, benefits and other options for treatment, the patient has consented to  Procedure(s): ?LEFT L1-2 TRANSFORAMINAL LUMBAR INTERBODY FUSION WITH GLOBUS PEDICLE SCREWS, RODS AND SABLE ADJUSTABLE CAGE, LOCAL BONE GRAFT, ALLOGRAFT BONE GRAFT, VIVIGEN (N/A) as a surgical intervention.  The patient's history has been reviewed, patient examined, no change in status, stable for surgery.  I have reviewed the patient's chart and labs.  Questions were answered to the patient's satisfaction.   ? ? ?[name] ? ? ?

## 2021-08-24 ENCOUNTER — Encounter (HOSPITAL_COMMUNITY): Payer: Self-pay | Admitting: Specialist

## 2021-08-24 ENCOUNTER — Observation Stay (HOSPITAL_COMMUNITY): Payer: Medicare Other

## 2021-08-24 DIAGNOSIS — R0902 Hypoxemia: Secondary | ICD-10-CM | POA: Diagnosis not present

## 2021-08-24 DIAGNOSIS — J9589 Other postprocedural complications and disorders of respiratory system, not elsewhere classified: Secondary | ICD-10-CM | POA: Diagnosis not present

## 2021-08-24 DIAGNOSIS — M5126 Other intervertebral disc displacement, lumbar region: Secondary | ICD-10-CM

## 2021-08-24 DIAGNOSIS — J440 Chronic obstructive pulmonary disease with acute lower respiratory infection: Secondary | ICD-10-CM | POA: Diagnosis present

## 2021-08-24 DIAGNOSIS — M5116 Intervertebral disc disorders with radiculopathy, lumbar region: Secondary | ICD-10-CM | POA: Diagnosis present

## 2021-08-24 DIAGNOSIS — E782 Mixed hyperlipidemia: Secondary | ICD-10-CM | POA: Diagnosis present

## 2021-08-24 DIAGNOSIS — D62 Acute posthemorrhagic anemia: Secondary | ICD-10-CM | POA: Diagnosis not present

## 2021-08-24 DIAGNOSIS — M4326 Fusion of spine, lumbar region: Secondary | ICD-10-CM | POA: Diagnosis not present

## 2021-08-24 DIAGNOSIS — R079 Chest pain, unspecified: Secondary | ICD-10-CM | POA: Diagnosis not present

## 2021-08-24 DIAGNOSIS — Z90710 Acquired absence of both cervix and uterus: Secondary | ICD-10-CM | POA: Diagnosis not present

## 2021-08-24 DIAGNOSIS — Z8551 Personal history of malignant neoplasm of bladder: Secondary | ICD-10-CM | POA: Diagnosis not present

## 2021-08-24 DIAGNOSIS — Z961 Presence of intraocular lens: Secondary | ICD-10-CM | POA: Diagnosis present

## 2021-08-24 DIAGNOSIS — M419 Scoliosis, unspecified: Secondary | ICD-10-CM | POA: Diagnosis present

## 2021-08-24 DIAGNOSIS — I2510 Atherosclerotic heart disease of native coronary artery without angina pectoris: Secondary | ICD-10-CM | POA: Diagnosis present

## 2021-08-24 DIAGNOSIS — Z20822 Contact with and (suspected) exposure to covid-19: Secondary | ICD-10-CM | POA: Diagnosis present

## 2021-08-24 DIAGNOSIS — G2581 Restless legs syndrome: Secondary | ICD-10-CM | POA: Diagnosis present

## 2021-08-24 DIAGNOSIS — F17210 Nicotine dependence, cigarettes, uncomplicated: Secondary | ICD-10-CM | POA: Diagnosis present

## 2021-08-24 DIAGNOSIS — Z9049 Acquired absence of other specified parts of digestive tract: Secondary | ICD-10-CM | POA: Diagnosis not present

## 2021-08-24 DIAGNOSIS — Z8673 Personal history of transient ischemic attack (TIA), and cerebral infarction without residual deficits: Secondary | ICD-10-CM | POA: Diagnosis not present

## 2021-08-24 DIAGNOSIS — J189 Pneumonia, unspecified organism: Secondary | ICD-10-CM | POA: Diagnosis present

## 2021-08-24 DIAGNOSIS — I872 Venous insufficiency (chronic) (peripheral): Secondary | ICD-10-CM | POA: Diagnosis present

## 2021-08-24 DIAGNOSIS — I10 Essential (primary) hypertension: Secondary | ICD-10-CM | POA: Diagnosis present

## 2021-08-24 DIAGNOSIS — F419 Anxiety disorder, unspecified: Secondary | ICD-10-CM | POA: Diagnosis present

## 2021-08-24 DIAGNOSIS — E559 Vitamin D deficiency, unspecified: Secondary | ICD-10-CM | POA: Diagnosis present

## 2021-08-24 DIAGNOSIS — H9191 Unspecified hearing loss, right ear: Secondary | ICD-10-CM | POA: Diagnosis present

## 2021-08-24 DIAGNOSIS — M4156 Other secondary scoliosis, lumbar region: Secondary | ICD-10-CM | POA: Diagnosis not present

## 2021-08-24 DIAGNOSIS — E039 Hypothyroidism, unspecified: Secondary | ICD-10-CM | POA: Diagnosis present

## 2021-08-24 DIAGNOSIS — Y95 Nosocomial condition: Secondary | ICD-10-CM | POA: Diagnosis not present

## 2021-08-24 DIAGNOSIS — Z8249 Family history of ischemic heart disease and other diseases of the circulatory system: Secondary | ICD-10-CM | POA: Diagnosis not present

## 2021-08-24 LAB — CBC WITH DIFFERENTIAL/PLATELET
Abs Immature Granulocytes: 0.1 10*3/uL — ABNORMAL HIGH (ref 0.00–0.07)
Basophils Absolute: 0 10*3/uL (ref 0.0–0.1)
Basophils Relative: 0 %
Eosinophils Absolute: 0 10*3/uL (ref 0.0–0.5)
Eosinophils Relative: 0 %
HCT: 30.5 % — ABNORMAL LOW (ref 36.0–46.0)
Hemoglobin: 9.8 g/dL — ABNORMAL LOW (ref 12.0–15.0)
Immature Granulocytes: 1 %
Lymphocytes Relative: 7 %
Lymphs Abs: 1.1 10*3/uL (ref 0.7–4.0)
MCH: 29.3 pg (ref 26.0–34.0)
MCHC: 32.1 g/dL (ref 30.0–36.0)
MCV: 91.3 fL (ref 80.0–100.0)
Monocytes Absolute: 1.1 10*3/uL — ABNORMAL HIGH (ref 0.1–1.0)
Monocytes Relative: 7 %
Neutro Abs: 13.3 10*3/uL — ABNORMAL HIGH (ref 1.7–7.7)
Neutrophils Relative %: 85 %
Platelets: 237 10*3/uL (ref 150–400)
RBC: 3.34 MIL/uL — ABNORMAL LOW (ref 3.87–5.11)
RDW: 13.7 % (ref 11.5–15.5)
WBC: 15.6 10*3/uL — ABNORMAL HIGH (ref 4.0–10.5)
nRBC: 0 % (ref 0.0–0.2)

## 2021-08-24 LAB — BASIC METABOLIC PANEL
Anion gap: 9 (ref 5–15)
BUN: 10 mg/dL (ref 8–23)
CO2: 20 mmol/L — ABNORMAL LOW (ref 22–32)
Calcium: 8.6 mg/dL — ABNORMAL LOW (ref 8.9–10.3)
Chloride: 108 mmol/L (ref 98–111)
Creatinine, Ser: 0.96 mg/dL (ref 0.44–1.00)
GFR, Estimated: 60 mL/min — ABNORMAL LOW (ref >60)
Glucose, Bld: 106 mg/dL — ABNORMAL HIGH (ref 70–99)
Potassium: 3.8 mmol/L (ref 3.5–5.1)
Sodium: 137 mmol/L (ref 135–145)

## 2021-08-24 LAB — CBC
HCT: 27.2 % — ABNORMAL LOW (ref 36.0–46.0)
Hemoglobin: 8.6 g/dL — ABNORMAL LOW (ref 12.0–15.0)
MCH: 28.7 pg (ref 26.0–34.0)
MCHC: 31.6 g/dL (ref 30.0–36.0)
MCV: 90.7 fL (ref 80.0–100.0)
Platelets: 224 10*3/uL (ref 150–400)
RBC: 3 MIL/uL — ABNORMAL LOW (ref 3.87–5.11)
RDW: 13.7 % (ref 11.5–15.5)
WBC: 13 10*3/uL — ABNORMAL HIGH (ref 4.0–10.5)
nRBC: 0 % (ref 0.0–0.2)

## 2021-08-24 MED ORDER — FERROUS GLUCONATE 324 (38 FE) MG PO TABS
324.0000 mg | ORAL_TABLET | Freq: Two times a day (BID) | ORAL | Status: DC
  Administered 2021-08-24 – 2021-08-26 (×4): 324 mg via ORAL
  Filled 2021-08-24 (×4): qty 1

## 2021-08-24 MED ORDER — TIOTROPIUM BROMIDE MONOHYDRATE 18 MCG IN CAPS: 18.0000 ug | ORAL_CAPSULE | Freq: Every day | RESPIRATORY_TRACT | Status: DC

## 2021-08-24 MED ORDER — UMECLIDINIUM BROMIDE 62.5 MCG/ACT IN AEPB
1.0000 | INHALATION_SPRAY | Freq: Every day | RESPIRATORY_TRACT | Status: DC
  Administered 2021-08-25 – 2021-08-26 (×2): 1 via RESPIRATORY_TRACT
  Filled 2021-08-24: qty 7

## 2021-08-24 MED ORDER — ALBUTEROL SULFATE (2.5 MG/3ML) 0.083% IN NEBU: 2.5000 mg | INHALATION_SOLUTION | RESPIRATORY_TRACT | Status: DC | PRN

## 2021-08-24 MED ORDER — UMECLIDINIUM BROMIDE 62.5 MCG/ACT IN AEPB
1.0000 | INHALATION_SPRAY | Freq: Every day | RESPIRATORY_TRACT | Status: DC
  Filled 2021-08-24: qty 7

## 2021-08-24 MED ORDER — CLOPIDOGREL BISULFATE 75 MG PO TABS
75.0000 mg | ORAL_TABLET | Freq: Every day | ORAL | Status: DC
  Administered 2021-08-24 – 2021-08-26 (×3): 75 mg via ORAL
  Filled 2021-08-24 (×3): qty 1

## 2021-08-24 MED ORDER — PANTOPRAZOLE SODIUM 40 MG PO TBEC
40.0000 mg | DELAYED_RELEASE_TABLET | Freq: Every day | ORAL | Status: DC
  Administered 2021-08-24 – 2021-08-25 (×2): 40 mg via ORAL
  Filled 2021-08-24 (×2): qty 1

## 2021-08-24 MED FILL — Heparin Sodium (Porcine) Inj 1000 Unit/ML: INTRAMUSCULAR | Qty: 30 | Status: AC

## 2021-08-24 MED FILL — Sodium Chloride IV Soln 0.9%: INTRAVENOUS | Qty: 1000 | Status: AC

## 2021-08-24 NOTE — Consult Note (Signed)
Initial Consultation Note ? ? ?Patient: Ana Santos QQI:[id number] DOB: Feb 03, 1941 PCP: Ana Salter, MD ?DOA: 08/23/2021 ?DOS: the patient was seen and examined on 08/24/2021 ?Primary service: Ana Oto, MD ? ?Referring physician: Louanne Santos, orthopedics ?Reason for consult: Lumbar fusion.  Desats today, h/o cerebral aneurysm, Plavix stopped for surgery.  CXR with B patchy infiltrates, WBC mildly increased.  Atelectasis vs. PNA. ? ? ? ?Assessment and Plan: ?* Fusion of spine of lumbar region ?-Patient with chronic back pain ?-Lumber fusion was performed yesterday and she appears to be doing well from this standpoint ?-I have reviewed this patient in the [place] Controlled Substances Reporting System.  She is receiving medications from only one provider and appears to be taking them as prescribed. ?-She is not at particularly high risk of opioid misuse, diversion, or overdose. ?-Management per Dr. Louanne Santos. ? ?Postoperative anemia due to acute blood loss ?-Hgb slightly lower than pre-op but not significantly so ?-Unlikely to be related to any complaints by the patient at this time ? ?Oxygen desaturation ?-She has had desaturation to 90% and is on Time O2 ?-While this may be an infectious process, this seems less likely ?-Instead, suspect atelectasis in conjunction with long-standing tobacco use/COPD ?-Continue [place] O2 prn ?-Follow without antibiotics at this time ?-Use IS religiously ?-Smoking cessation is essential ? ?Essential hypertension ?-Continue amlodipine ? ?RLS (restless legs syndrome) ?-Continue Mirapex ? ?COPD (chronic obstructive pulmonary disease) with chronic bronchitis ([place]) ?-Likely contributing to her current hypoxia ?-She may require long-term O2 therapy ?-Will follow ?-Will add Spiriva and Albuterol prn ? ?Tobacco use disorder, continuous ?-Encourage cessation.   ?-This was discussed with the patient and should be reviewed on an ongoing basis.   ?-Patch declined by patient ? ?Mixed  hyperlipidemia ?-Continue Pravastatin ? ?Hypothyroidism ?-Continue Synthroid ? ? ? ? ?TRH will continue to follow the patient. ? ?HPI: Ana Santos is a 81 y.o. female with past medical history of acoustic neuroma; cerebral aneurysm s/p coiling; bladder cancer; COPD; HTN; HLD; and hypothyroidism presenting for lumbar fusion yesterday.  She reports that her pain is improving and she is becoming more mobile.  She is not feeling SOB but is hypoxic with ambulation.  She is a long-term smoker and has been smoking 4-5 cigs/day recently; she declined a patch.  +cough, not significantly changed from baseline.  She is not on home O2.  No fever.  Overall, she feels well. ? ? ? ?Review of Systems: As mentioned in the history of present illness. All other systems reviewed and are negative. ?Past Medical History:  ?Diagnosis Date  ? AN (acoustic neuroma) ([place])   ? deafness in R ear  ? Aneurysm ([place])   ? 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 Anxiety   ? Bladder cancer ([place])   ? Cataract   ? Cigarette smoker   ? COPD (chronic obstructive pulmonary disease) ([place])   ? mild, no inhalers or oxygen used  ? Coronary artery disease   ? no current cardiologist  pt. denies  ? Depression   ? Headache   ? tension headaches due to aneurysm repair with stent  ? Hyperlipidemia   ? Hypertension   ? Hypothyroidism   ? IBS (irritable bowel syndrome)   ? Mitral valve prolapse   ? mild takes beta blocker  ? Osteopenia   ? Stroke [facility])   ? x 3 ministrokes last one feb 2014  ? Venous insufficiency   ? Vitamin D deficiency   ? ?  Past Surgical History:  ?Procedure Laterality Date  ? ANEURYSM COILING  04/2009  ? Dr. Estanislado Pandy  ? APPENDECTOMY    ? BACK SURGERY  02/28/2021  ? BREAST LUMPECTOMY Right 1986  ? benign  ? DIAGNOSTIC LAPAROSCOPY    ? Proable laparoscopic right colectomy 03-14-18 Dr. Excell Seltzer  ? EYE SURGERY Bilateral   ? ioc lens for cataracts  ? LAPAROSCOPIC RIGHT COLECTOMY Right 03/14/2018  ? Procedure: LAPAROSCOPIC  ASSISTED  RIGHT COLECTOMY;  Surgeon: Excell Seltzer, MD;  Location: [facility];  Service: General;  Laterality: Right;  ? LAPAROSCOPY N/A 03/14/2018  ? Procedure: LAPAROSCOPY;  Surgeon: Excell Seltzer, MD;  Location: [facility];  Service: General;  Laterality: N/A;  ? LEFT HEART CATHETERIZATION WITH CORONARY ANGIOGRAM N/A 08/05/2014  ? Procedure: LEFT HEART CATHETERIZATION WITH CORONARY ANGIOGRAM;  Surgeon: Wellington Hampshire, MD;  Location: [facility];  Service: Cardiovascular;  Laterality: N/A;  ? LUMBAR LAMINECTOMY N/A 02/28/2021  ? Procedure: LEFT L1-L2 MICRODISCECTOMY AND FASCIECTOMY;  Surgeon: Marybelle Killings, MD;  Location: [facility];  Service: Orthopedics;  Laterality: N/A;  ? TONSILLECTOMY    ? TOTAL ABDOMINAL HYSTERECTOMY  1970  ? complete  ? TRANSLABYRINTHINE PROCEDURE  2002  ? [facility] Dr. Vicie Mutters tumor removal  ? TRANSURETHRAL RESECTION OF BLADDER TUMOR N/A 02/08/2018  ? Procedure: TRANSURETHRAL RESECTION OF BLADDER TUMOR (TURBT)/ POSTOPERATIVE INSTILLATION OF CHEMO THERAPY;  Surgeon: Festus Aloe, MD;  Location: [facility];  Service: Urology;  Laterality: N/A;  ? ?Social History:  reports that she has been smoking cigarettes. She has a 12.50 pack-year smoking history. She has never used smokeless tobacco. She reports that she does not drink alcohol and does not use drugs. ? ?Allergies  ?Allergen Reactions  ? Prednisone Other (See Comments)  ?  Hallucinations  ? Atorvastatin Other (See Comments)  ?   Lipitor caused arm pain (3/09)  ? Hydrocodone-Acetaminophen Other (See Comments)  ?  hallucinations  ? Morphine Other (See Comments)  ?  hallucinations  ? Nortriptyline Nausea And Vomiting  ?  Caused nausea and vomiting and shaking, kept her up all night  ? Topamax [Topiramate] Nausea And Vomiting  ?  *dizziness*  ? Tramadol Other (See Comments)  ?  withdrawal  ? ? ?Family History  ?Problem Relation Age of Onset  ? Heart disease Mother   ? Cancer Mother   ?     Lung  ? Heart attack Mother   ?  Diabetes Mother   ? Cancer Father   ?     Lung  ? Diabetes Sister   ? Cancer Sister   ?     unkown type  ? Diabetes Brother   ? Lung cancer Brother   ?     Lung  ? AAA (abdominal aortic aneurysm) Neg Hx   ? Colon cancer Neg Hx   ? Stomach cancer Neg Hx   ? Esophageal cancer Neg Hx   ? Liver cancer Neg Hx   ? Pancreatic cancer Neg Hx   ? Rectal cancer Neg Hx   ? ? ?Prior to Admission medications   ?Medication Sig Start Date End Date Taking? Authorizing Provider  ?acetaminophen (TYLENOL) 500 MG tablet Take 500 mg by mouth every 6 (six) hours as needed for mild pain.   Yes [provider]  ?amLODipine (NORVASC) 5 MG tablet TAKE 1 TABLET DAILY 07/25/21  Yes Ana Salter, MD  ?Cholecalciferol (VITAMIN D-3) 125 MCG (5000 UT) TABS Take 5,000 Units by mouth daily.  Yes [provider]  ?gabapentin (NEURONTIN) 100 MG capsule Take 1 capsule (100 mg total) by mouth 2 (two) times daily AND 2 capsules (200 mg total) at bedtime. Take  2 po q HS. 07/18/21  Yes Ana Oto, MD  ?hydroxypropyl methylcellulose / hypromellose (ISOPTO TEARS / GONIOVISC) 2.5 % ophthalmic solution Place 1 drop into both eyes at bedtime.   Yes [provider]  ?levothyroxine (SYNTHROID) 88 MCG tablet TAKE 1 TABLET DAILY 06/01/21  Yes Ana Salter, MD  ?pramipexole (MIRAPEX) 0.5 MG tablet Take one nightly one hour prior to going to bed as needed for restless leg syndrome. 11/29/20  Yes Libby Maw, MD  ?pravastatin (PRAVACHOL) 40 MG tablet TAKE 1 TABLET DAILY 12/15/20  Yes Libby Maw, MD  ?sertraline (ZOLOFT) 50 MG tablet Take 1 tablet (50 mg total) by mouth daily. 08/03/21  Yes Ana Salter, MD  ?ondansetron (ZOFRAN) 4 MG tablet Take 1 tablet (4 mg total) by mouth every 8 (eight) hours as needed for nausea or vomiting. ?Patient not taking: Reported on 08/03/2021 12/23/20   Lorine Bears, NP  ?oxyCODONE-acetaminophen (PERCOCET/ROXICET) 5-325 MG tablet Take 1-2 tablets by mouth every 8 (eight) hours  as needed for severe pain. 08/05/21   Ana Oto, MD  ? ? ?Physical Exam: ?Vitals:  ? 08/24/21 0330 08/24/21 0810 08/24/21 1158 08/24/21 1541  ?BP: (!) 133/43 (!) 140/48 (!) 129/48 (!) 111/51  ?Pulse: 85 89 70 90  ?Resp: 16 18 1

## 2021-08-24 NOTE — Evaluation (Signed)
Occupational Therapy Evaluation ?Patient Details ?Name: Ana Santos ?MRN: [id number] ?DOB: Aug 04, 1940 ?Today's Date: 08/24/2021 ? ? ?History of Present Illness 81 y.o. female presents to [facility] hospital on 08/23/2021 with L1-2 spondylosis. PT underwent L L1-2 TLIF on 3/14. PMH includes anxiety, bladder cancer, COPD, depression, HTN, HLD, CVA.  ? ?Clinical Impression ?  ?Fredericka was evaluated s/p the above back sx, she required some assist with ADL/IADLs and used AD for mobility PTA due to back pain. She lives at home with her husband who can assist 24/7 as needed at d/c. Upon evaluation pt required minA for LB ADLs, set up for UB ADLs and supervision for functional mobility and grooming. Pt had great recall of back precautions and maintained precautions well during ADLs. Pt does not required further OT acutely. Recommend d/c to home with husband assisting as needed.  ?   ? ?Recommendations for follow up therapy are one component of a multi-disciplinary discharge planning process, led by the attending physician.  Recommendations may be updated based on patient status, additional functional criteria and insurance authorization.  ? ?Follow Up Recommendations ? No OT follow up  ?  ?Assistance Recommended at Discharge Intermittent Supervision/Assistance  ?Patient can return home with the following A little help with walking and/or transfers;A little help with bathing/dressing/bathroom;Assist for transportation;Help with stairs or ramp for entrance ? ?  ?Functional Status Assessment ? Patient has had a recent decline in their functional status and demonstrates the ability to make significant improvements in function in a reasonable and predictable amount of time.  ?Equipment Recommendations ? None recommended by OT  ?  ?   ?Precautions / Restrictions Precautions ?Precautions: Back ?Precaution Booklet Issued: Yes (comment) ?Required Braces or Orthoses: Spinal Brace ?Spinal Brace: Lumbar corset;Applied in sitting  position ?Restrictions ?Weight Bearing Restrictions: No  ? ?  ? ?Mobility Bed Mobility ?Overal bed mobility: Modified Independent ?  ?  ?  ?  ?  ?  ?General bed mobility comments: slow/deliberate for pain management. good log roll. ?  ? ?Transfers ?Overall transfer level: Modified independent ?Equipment used: None ?Transfers: Sit to/from Stand ?Sit to Stand: Modified independent (Device/Increase time) ?  ?  ?  ?  ?  ?General transfer comment: supervision provided, however pt demonstrated mod I ?  ? ?  ?Balance Overall balance assessment: Needs assistance ?Sitting-balance support: No upper extremity supported, Feet supported ?Sitting balance-Leahy Scale: Good ?  ?  ?Standing balance support: No upper extremity supported, During functional activity ?Standing balance-Leahy Scale: Fair ?Standing balance comment: stood at sink for ~5 minutes to groom without UE support ?  ?  ?  ?  ?  ?  ?  ?  ?  ?  ?  ?   ? ?ADL either performed or assessed with clinical judgement  ? ?ADL Overall ADL's : Needs assistance/impaired ?Eating/Feeding: Independent;Sitting ?  ?Grooming: Supervision/safety;Standing ?Grooming Details (indicate cue type and reason): at the sink ?Upper Body Bathing: Set up;Sitting ?  ?Lower Body Bathing: Minimal assistance;Sitting/lateral leans ?  ?Upper Body Dressing : Set up;Sitting ?Upper Body Dressing Details (indicate cue type and reason): including brace ?Lower Body Dressing: Minimal assistance;Sit to/from stand ?  ?Toilet Transfer: Supervision/safety;Ambulation ?Toilet Transfer Details (indicate cue type and reason): without AD ?Toileting- Clothing Manipulation and Hygiene: Supervision/safety;Adhering to back precautions ?  ?  ?  ?Functional mobility during ADLs: Supervision/safety;Cueing for safety ?General ADL Comments: assist for lower body tasks to maintain back precautions and for pain management - pt reports her husbadn will assist  as needed with LB tasks at d/c. Reviewed compensatory techniques and  sitting to shower.  ? ? ? ?Vision Baseline Vision/History: 0 No visual deficits ?Ability to See in Adequate Light: 0 Adequate ?Patient Visual Report: No change from baseline ?Vision Assessment?: No apparent visual deficits  ?   ?   ?   ? ?Pertinent Vitals/Pain Pain Assessment ?Pain Assessment: 0-10 ?Pain Score: 5  ?Pain Location: back ?Pain Descriptors / Indicators: Sore ?Pain Intervention(s): Limited activity within patient's tolerance, Monitored during session  ? ? ? ?Hand Dominance Right ?  ?Extremity/Trunk Assessment Upper Extremity Assessment ?Upper Extremity Assessment: Generalized weakness ?  ?Lower Extremity Assessment ?Lower Extremity Assessment: Generalized weakness ?  ?Cervical / Trunk Assessment ?Cervical / Trunk Assessment: Back Surgery ?  ?Communication Communication ?Communication: HOH ?  ?Cognition Arousal/Alertness: Awake/alert ?Behavior During Therapy: [facility] for tasks assessed/performed ?Overall Cognitive Status: Within Functional Limits for tasks assessed ?  ?  ?  ?  ?  ?  ?  ?General Comments: recalled with no assist ?  ?  ?General Comments  VSS on RA ? ?  ?   ?   ? ? ?Home Living Family/patient expects to be discharged to:: Private residence ?Living Arrangements: Spouse/significant other ?Available Help at Discharge: Family;Available 24 hours/day ?Type of Home: House ?Home Access: Stairs to enter ?Entrance Stairs-Number of Steps: 2 ?Entrance Stairs-Rails: None ?Home Layout: One level ?  ?  ?Bathroom Shower/Tub: Tub/shower unit ?  ?Bathroom Toilet: Handicapped height ?Bathroom Accessibility: Yes ?  ?Home Equipment: Conservation officer, nature (2 wheels);Cane - single point ?  ?  ?  ? ?  ?Prior Functioning/Environment Prior Level of Function : Needs assist ?  ?  ?  ?  ?  ?  ?Mobility Comments: pt reports ambulating with a walker or cane since surgery in september due to back pain, requires assistance to negotiate stairs ?ADLs Comments: spouse assists with IADLs and ADLs depending on pain ?  ? ?  ?  ?OT Problem  List: Decreased strength;Decreased activity tolerance;Decreased range of motion;Impaired balance (sitting and/or standing);Decreased safety awareness;Decreased knowledge of use of DME or AE;Decreased knowledge of precautions;Pain ?  ?   ?OT Treatment/Interventions:    ?  ?OT Goals(Current goals can be found in the care plan section) Acute Rehab OT Goals ?OT Goal Formulation: All assessment and education complete, DC therapy ?Time For Goal Achievement: 08/24/21 ?Potential to Achieve Goals: Good  ?   ? ?   ?AM-PAC OT "6 Clicks" Daily Activity     ?Outcome Measure Help from another person eating meals?: None ?Help from another person taking care of personal grooming?: A Little ?Help from another person toileting, which includes using toliet, bedpan, or urinal?: A Little ?Help from another person bathing (including washing, rinsing, drying)?: A Little ?Help from another person to put on and taking off regular upper body clothing?: None ?Help from another person to put on and taking off regular lower body clothing?: A Little ?6 Click Score: 20 ?  ?End of Session Equipment Utilized During Treatment: Back brace ?Nurse Communication: Mobility status ? ?Activity Tolerance: Patient tolerated treatment well ?Patient left: in bed;with call bell/phone within reach;with bed alarm set ? ?OT Visit Diagnosis: Unsteadiness on feet (R26.81);Other abnormalities of gait and mobility (R26.89);Muscle weakness (generalized) (M62.81);Pain  ?              ?Time: 1000-1017 ?OT Time Calculation (min): 17 min ?Charges:  OT General Charges ?$OT Visit: 1 Visit ?OT Evaluation ?$OT Eval Low Complexity: 1 Low ? ? ?  Rakia Frayne A Chavie Kolinski ?08/24/2021, 10:32 AM ?

## 2021-08-24 NOTE — Assessment & Plan Note (Addendum)
-  Patient with chronic back pain ?-Lumber fusion was performed here and she appears to be doing well from this standpoint ?-Management per Dr. Louanne Skye. ?

## 2021-08-24 NOTE — Assessment & Plan Note (Addendum)
-  Likely contributing to her current hypoxia ?-She is currently on room air this morning ?-Not on inhalers at home.  Please provide prescription of albuterol on discharge ?

## 2021-08-24 NOTE — Evaluation (Signed)
Physical Therapy Evaluation ?Patient Details ?Name: Ana Santos ?MRN: [id number] ?DOB: [birth year] ?Today's Date: 08/24/2021 ? ?History of Present Illness ? 81 y.o. female presents to [facility] hospital on 08/23/2021 with L1-2 spondylosis. PT underwent L L1-2 TLIF on 3/14. PMH includes anxiety, bladder cancer, COPD, depression, HTN, HLD, CVA.  ?Clinical Impression ? Pt presents to PT with deficits in activity tolerance, gait, balance, and with post-surgical back pain. Pt is able to mobilize without physical assistance utilizing a RW, no losses of balance noted. Pt reports being very independent and mobilizing without and assistive device prior to surgery in September, with a goal to return to gardening and moving without an assistive device. Pt will benefit from continued gait training in an effort to restore independence.   ?   ? ?Recommendations for follow up therapy are one component of a multi-disciplinary discharge planning process, led by the attending physician.  Recommendations may be updated based on patient status, additional functional criteria and insurance authorization. ? ?Follow Up Recommendations No PT follow up ? ?  ?Assistance Recommended at Discharge PRN  ?Patient can return home with the following ? Help with stairs or ramp for entrance;Assistance with cooking/housework ? ?  ?Equipment Recommendations None recommended by PT  ?Recommendations for Other Services ?    ?  ?Functional Status Assessment Patient has had a recent decline in their functional status and demonstrates the ability to make significant improvements in function in a reasonable and predictable amount of time.  ? ?  ?Precautions / Restrictions Precautions ?Precautions: Back ?Precaution Booklet Issued: Yes (comment) ?Required Braces or Orthoses: Spinal Brace ?Spinal Brace: Lumbar corset;Applied in sitting position ?Restrictions ?Weight Bearing Restrictions: No  ? ?  ? ?Mobility ? Bed Mobility ?Overal bed mobility: Modified Independent ?   ?  ?  ?  ?  ?  ?General bed mobility comments: roll and sidelying to sit, slight elevation of head of bed ?  ? ?Transfers ?Overall transfer level: Modified independent ?Equipment used: Rolling walker (2 wheels) ?Transfers: Sit to/from Stand ?Sit to Stand: Modified independent (Device/Increase time) ?  ?  ?  ?  ?  ?  ?  ? ?Ambulation/Gait ?Ambulation/Gait assistance: Modified independent (Device/Increase time) ?Gait Distance (Feet): 150 Feet ?Assistive device: Rolling walker (2 wheels) ?Gait Pattern/deviations: Step-through pattern ?Gait velocity: reduced ?Gait velocity interpretation: <1.8 ft/sec, indicate of risk for recurrent falls ?  ?General Gait Details: pt with slowed step-through gait, 3 very brief standing rest breaks due to fatigue ? ?Stairs ?  ?  ?  ?  ?  ? ?Wheelchair Mobility ?  ? ?Modified Rankin (Stroke Patients Only) ?  ? ?  ? ?Balance Overall balance assessment: Needs assistance ?Sitting-balance support: No upper extremity supported, Feet supported ?Sitting balance-Leahy Scale: Good ?  ?  ?Standing balance support: Single extremity supported, Reliant on assistive device for balance ?Standing balance-Leahy Scale: Poor ?  ?  ?  ?  ?  ?  ?  ?  ?  ?  ?  ?  ?   ? ? ? ?Pertinent Vitals/Pain Pain Assessment ?Pain Assessment: 0-10 ?Pain Score: 5  ?Pain Location: back ?Pain Descriptors / Indicators: Sore ?Pain Intervention(s): Monitored during session  ? ? ?Home Living Family/patient expects to be discharged to:: Private residence ?Living Arrangements: Spouse/significant other ?Available Help at Discharge: Family;Available 24 hours/day ?Type of Home: House ?Home Access: Stairs to enter ?Entrance Stairs-Rails: None ?Entrance Stairs-Number of Steps: 2 ?  ?Home Layout: One level ?Home Equipment: Conservation officer, nature (2 wheels);Cane -  single point ?   ?  ?Prior Function Prior Level of Function : Needs assist ?  ?  ?  ?  ?  ?  ?Mobility Comments: pt reports ambulating with a walker or cane since surgery in september  due to back pain, requires assistance to negotiate stairs ?ADLs Comments: spouse assists with IADLs ?  ? ? ?Hand Dominance  ? Dominant Hand: Right ? ?  ?Extremity/Trunk Assessment  ? Upper Extremity Assessment ?Upper Extremity Assessment: Overall WFL for tasks assessed ?  ? ?Lower Extremity Assessment ?Lower Extremity Assessment: Overall WFL for tasks assessed ?  ? ?Cervical / Trunk Assessment ?Cervical / Trunk Assessment: Back Surgery  ?Communication  ? Communication: HOH  ?Cognition Arousal/Alertness: Awake/alert ?Behavior During Therapy: [facility] for tasks assessed/performed ?Overall Cognitive Status: Within Functional Limits for tasks assessed ?  ?  ?  ?  ?  ?  ?  ?  ?  ?  ?  ?  ?  ?  ?  ?  ?  ?  ?  ? ?  ?General Comments General comments (skin integrity, edema, etc.): VSS on RA ? ?  ?Exercises    ? ?Assessment/Plan  ?  ?PT Assessment Patient needs continued PT services  ?PT Problem List Decreased activity tolerance;Decreased balance;Decreased mobility;Decreased knowledge of precautions;Pain ? ?   ?  ?PT Treatment Interventions DME instruction;Gait training;Stair training;Functional mobility training;Therapeutic activities;Therapeutic exercise;Balance training;Neuromuscular re-education;Patient/family education   ? ?PT Goals (Current goals can be found in the Care Plan section)  ?Acute Rehab PT Goals ?Patient Stated Goal: to return home ?PT Goal Formulation: With patient ?Time For Goal Achievement: 08/28/21 ?Potential to Achieve Goals: Good ? ?  ?Frequency Min 5X/week ?  ? ? ?Co-evaluation   ?  ?  ?  ?  ? ? ?  ?AM-PAC PT "6 Clicks" Mobility  ?Outcome Measure Help needed turning from your back to your side while in a flat bed without using bedrails?: None ?Help needed moving from lying on your back to sitting on the side of a flat bed without using bedrails?: None ?Help needed moving to and from a bed to a chair (including a wheelchair)?: None ?Help needed standing up from a chair using your arms (e.g., wheelchair or  bedside chair)?: None ?Help needed to walk in hospital room?: None ?Help needed climbing 3-5 steps with a railing? : A Little ?6 Click Score: 23 ? ?  ?End of Session Equipment Utilized During Treatment: Back brace ?Activity Tolerance: Patient tolerated treatment well ?Patient left: in chair;with call bell/phone within reach ?Nurse Communication: Mobility status ?PT Visit Diagnosis: Other abnormalities of gait and mobility (R26.89) ?  ? ?Time: 6283-1517 ?PT Time Calculation (min) (ACUTE ONLY): 16 min ? ? ?Charges:   PT Evaluation ?$PT Eval Low Complexity: 1 Low ?  ?  ?   ? ? ?Zenaida Niece, PT, DPT ?Acute Rehabilitation ?Pager: [phone] ?Office [phone] ? ? ?Zenaida Niece ?08/24/2021, 9:33 AM ? ?

## 2021-08-24 NOTE — Assessment & Plan Note (Signed)
-  Hgb slightly lower than pre-op but not significantly so ?-Unlikely to be related to any complaints by the patient at this time ?

## 2021-08-24 NOTE — Anesthesia Postprocedure Evaluation (Signed)
Anesthesia Post Note ? ?Patient: Ana Santos ? ?Procedure(s) Performed: LEFT LUMBBAR ONE-TWOTRANSFORAMINAL LUMBAR INTERBODY FUSION WITH GLOBUS PEDICLE SCREWS, RODS AND SABLE ADJUSTABLE CAGE, LOCAL BONE GRAFT, ALLOGRAFT BONE GRAFT, VIVIGEN ? ?  ? ?Patient location during evaluation: PACU ?Anesthesia Type: General ?Level of consciousness: awake and alert ?Pain management: pain level controlled ?Vital Signs Assessment: post-procedure vital signs reviewed and stable ?Respiratory status: spontaneous breathing, nonlabored ventilation, respiratory function stable and patient connected to nasal cannula oxygen ?Cardiovascular status: blood pressure returned to baseline and stable ?Postop Assessment: no apparent nausea or vomiting ?Anesthetic complications: no ? ? ?No notable events documented. ? ?Last Vitals:  ?Vitals:  ? 08/24/21 0330 08/24/21 0810  ?BP: (!) 133/43 (!) 140/48  ?Pulse: 85 89  ?Resp: 16 18  ?Temp: 37.6 ?C 37.1 ?C  ?SpO2: 96% 95%  ?  ?Last Pain:  ?Vitals:  ? 08/24/21 0810  ?TempSrc: Oral  ?PainSc:   ? ? ?  ?  ?  ?  ?  ?  ? ?Tayler Heiden ? ? ? ? ?

## 2021-08-24 NOTE — Care Management Obs Status (Signed)
MEDICARE OBSERVATION STATUS NOTIFICATION ? ? ?Patient Details  ?Name: Ana Santos ?MRN: [id number] ?Date of Birth: 1940/10/02 ? ? ?Medicare Observation Status Notification Given:  Yes ? ? ? ?Angelita Ingles, RN ?08/24/2021, 11:54 AM ?

## 2021-08-24 NOTE — Assessment & Plan Note (Signed)
-  Continue Pravastatin ?

## 2021-08-24 NOTE — Progress Notes (Signed)
? ? ? ?  Subjective: ?1 Day Post-Op Procedure(s) (LRB): ?LEFT LUMBBAR ONE-TWOTRANSFORAMINAL LUMBAR INTERBODY FUSION WITH GLOBUS PEDICLE SCREWS, RODS AND SABLE ADJUSTABLE CAGE, LOCAL BONE GRAFT, ALLOGRAFT BONE GRAFT, VIVIGEN (N/A) ?Awake, alert and oriented x 4  Hgb 8.6 for 200 cc blood loss, she is having moderate pain. ?Legs are comfortable. No HA. ? ?Patient reports pain as moderate.   ? ?Objective:  ? ?VITALS:  Temp:  [98.1 ?F (36.7 ?C)-99.7 ?F (37.6 ?C)] 98.7 ?F (37.1 ?C) (03/15 0810) ?Pulse Rate:  [55-89] 89 (03/15 0810) ?Resp:  [14-23] 18 (03/15 0810) ?BP: (111-145)/(43-72) 140/48 (03/15 0810) ?SpO2:  [90 %-100 %] 95 % (03/15 0810) ? ?Neurologically intact ?ABD soft ?Neurovascular intact ?Sensation intact distally ?Intact pulses distally ?Dorsiflexion/Plantar flexion intact ?Incision: dressing C/D/I and no drainage ?Compartment soft ? ? ?LABS ?Recent Labs  ?  08/24/21 ?0343  ?HGB 8.6*  ?WBC 13.0*  ?PLT 224  ? ?Recent Labs  ?  08/24/21 ?0343  ?NA 137  ?K 3.8  ?CL 108  ?CO2 20*  ?BUN 10  ?CREATININE 0.96  ?GLUCOSE 106*  ? ?No results for input(s): LABPT, INR in the last 72 hours. ? ? ?Assessment/Plan: ?1 Day Post-Op Procedure(s) (LRB): ?LEFT LUMBBAR ONE-TWOTRANSFORAMINAL LUMBAR INTERBODY FUSION WITH GLOBUS PEDICLE SCREWS, RODS AND SABLE ADJUSTABLE CAGE, LOCAL BONE GRAFT, ALLOGRAFT BONE GRAFT, VIVIGEN (N/A) ? ?Advance diet ?Up with therapy ?Restart IVF at 75cc/hr IV fluids ?Start PT and OT and recheck H/H ?Ferrous gluconate ordered.  ? ? ?Ana Santos ?08/24/2021, 8:49 AM Patient ID: Ana Santos, female   DOB: Jul 27, 1940, 81 y.o.   MRN: [id number] ? ?

## 2021-08-24 NOTE — Assessment & Plan Note (Addendum)
-  Encourage cessation.   ?-Patch declined by patient ?

## 2021-08-24 NOTE — Assessment & Plan Note (Signed)
-   Continue amlodipine ?

## 2021-08-24 NOTE — Assessment & Plan Note (Signed)
-  Continue Mirapex.

## 2021-08-24 NOTE — Assessment & Plan Note (Addendum)
-  On room air this morning ?-Lungs were clear on auscultation without any wheezing or rhonchi ?

## 2021-08-24 NOTE — Progress Notes (Addendum)
Patient ID: Ana Santos, female   DOB: March 31, 1941, 81 y.o.   MRN: [id number] ?Patient did well with PT, she however was noted to have decreased oxygen saturation so  ?CXR is ordered and she is on oxygen per nasal cannula. CBC shows Hgb is stable 9.8 and HCt 30.5 %She has a history of COPD and previous brain aneurysm and right side occlusionof the anterior inferior cerebellar artery with infaction.  ?PCXR is done and it shows left lower lobe infiltrates. ?Saturating 90% on oxygen per nasal cannula ?Appears SOB with minimal exertion. ?Impression: Left lower lobe infiltrates, patchy atelectasis vs pneumonia POD#1 L1-2 interbody lumbar fusion. Anemia due to blood loss from surgery. ? ?Plan: Continue O2 pnc, Consult [facility]. ?Hold discharge, likely will need to change to inpatient status.  ? ? ?

## 2021-08-24 NOTE — TOC Initial Note (Signed)
Transition of Care (TOC) - Initial/Assessment Note  ? ? ?Patient Details  ?Name: Ana Santos ?MRN: [id number] ?Date of Birth: 1940/08/10 ? ?Transition of Care (TOC) CM/SW Contact:    ?Angelita Ingles, RN ?Phone Number:[phone] ? ?08/24/2021, 12:09 PM ? ?Clinical Narrative:                 ?TOC consulted for patient with [place] orders. Choice offered for [facility] services. Patient states that she does not want home health. There are currently no recommendations for DME per PT/OT notes. TOC will continue to follow for any other disposition needs.  ? ?Expected Discharge Plan: Home/Self Care ?Barriers to Discharge: Continued Medical Work up ? ? ?Patient Goals and CMS Choice ?Patient states their goals for this hospitalization and ongoing recovery are:: Wants to get better to go home ?CMS Medicare.gov Compare Post Acute Care list provided to:: Patient (patient refused after list was presented) ?Choice offered to / list presented to : Patient ? ?Expected Discharge Plan and Services ?Expected Discharge Plan: Home/Self Care ?In-house Referral: NA ?Discharge Planning Services: CM Consult ?Post Acute Care Choice: NA ?Living arrangements for the past 2 months: [place] ?                ?DME Arranged: N/A ?DME Agency: NA ?  ?  ?  ?HH Arranged: NA ?[place] Agency: NA ?  ?  ?  ? ?Prior Living Arrangements/Services ?Living arrangements for the past 2 months: [place] ?Lives with:: Spouse ?Patient language and need for interpreter reviewed:: Yes ?Do you feel safe going back to the place where you live?: Yes      ?Need for Family Participation in Patient Care: Yes (Comment) ?Care giver support system in place?: Yes (comment) ?  ?Criminal Activity/Legal Involvement Pertinent to Current Situation/Hospitalization: No - Comment as needed ? ?Activities of Daily Living ?  ?  ? ?Permission Sought/Granted ?  ?Permission granted to share information with : No ?   ?   ?   ?   ? ?Emotional Assessment ?Appearance:: Appears younger  than stated age ?Attitude/Demeanor/Rapport: Gracious ?Affect (typically observed): Pleasant, Quiet ?Orientation: : Oriented to Self, Oriented to Place, Oriented to  Time, Oriented to Situation ?Alcohol / Substance Use: Not Applicable ?  ? ?Admission diagnosis:  Fusion of spine of lumbar region [M43.26] ?Patient Active Problem List  ? Diagnosis Date Noted  ? Other secondary scoliosis, lumbar region 08/23/2021  ?  Class: Chronic  ? Fusion of spine of lumbar region 08/23/2021  ? Lumbar post-laminectomy syndrome 06/15/2021  ? Status post lumbar microdiscectomy 03/09/2021  ? Recurrent herniation of lumbar disc 02/28/2021  ? Metatarsal stress fracture of left foot 02/10/2021  ? Herniated intervertebral disc of lumbar spine 01/26/2021  ? Foraminal stenosis due to intervertebral disc disease 01/21/2021  ? History of bladder cancer 01/21/2021  ? Labyrinthitis 12/08/2019  ? Left-sided low back pain without sciatica 05/05/2019  ? Visual loss, transient, bilateral 02/21/2019  ? Essential hypertension 02/21/2019  ? Colonic mass s/p lap ileocectomy 03/14/2018 03/14/2018  ? Acute lower GI bleeding 03/14/2018  ? Chronic anticoagulation 03/14/2018  ? Chronic tension-type headache, not intractable 05/31/2015  ? 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 RLS (restless legs syndrome) 07/04/2012  ? History of cerebral aneurysm 03/26/2009  ? Vitamin D deficiency 08/10/2008  ? Venous insufficiency 08/10/2008  ? Disorder of bone and cartilage 12/18/2007  ? History of acoustic  neuroma 08/18/2007  ? Hypothyroidism 08/18/2007  ? Tobacco use disorder, continuous 08/18/2007  ? Mixed hyperlipidemia 07/18/2007  ? Anxiety with depression 07/18/2007  ? COPD (chronic obstructive pulmonary disease) with chronic bronchitis ([place]) 07/18/2007  ? Fibromyalgia 07/18/2007  ? ?PCP:  Haydee Salter, MD ?Pharmacy:   ?[place] [place] ?[address] ?[place] [address] ?Phone: [phone] Fax: [phone] ? ?[place], [place] ?[place] ?[place] [address] ?Phone: [phone] Fax: [phone] ? ? ? ? ?Social Determinants of Health (SDOH) Interventions ?  ? ?Readmission Risk Interventions ?No flowsheet data found. ? ? ?

## 2021-08-24 NOTE — Assessment & Plan Note (Signed)
Continue Synthroid °

## 2021-08-25 DIAGNOSIS — J189 Pneumonia, unspecified organism: Secondary | ICD-10-CM

## 2021-08-25 DIAGNOSIS — M4326 Fusion of spine, lumbar region: Secondary | ICD-10-CM | POA: Diagnosis not present

## 2021-08-25 LAB — CBC WITH DIFFERENTIAL/PLATELET
Abs Immature Granulocytes: 0.08 10*3/uL — ABNORMAL HIGH (ref 0.00–0.07)
Basophils Absolute: 0 10*3/uL (ref 0.0–0.1)
Basophils Relative: 0 %
Eosinophils Absolute: 0 10*3/uL (ref 0.0–0.5)
Eosinophils Relative: 0 %
HCT: 28.6 % — ABNORMAL LOW (ref 36.0–46.0)
Hemoglobin: 9.2 g/dL — ABNORMAL LOW (ref 12.0–15.0)
Immature Granulocytes: 1 %
Lymphocytes Relative: 13 %
Lymphs Abs: 2.2 10*3/uL (ref 0.7–4.0)
MCH: 29.3 pg (ref 26.0–34.0)
MCHC: 32.2 g/dL (ref 30.0–36.0)
MCV: 91.1 fL (ref 80.0–100.0)
Monocytes Absolute: 1.1 10*3/uL — ABNORMAL HIGH (ref 0.1–1.0)
Monocytes Relative: 6 %
Neutro Abs: 13.1 10*3/uL — ABNORMAL HIGH (ref 1.7–7.7)
Neutrophils Relative %: 80 %
Platelets: 243 10*3/uL (ref 150–400)
RBC: 3.14 MIL/uL — ABNORMAL LOW (ref 3.87–5.11)
RDW: 13.8 % (ref 11.5–15.5)
WBC: 16.4 10*3/uL — ABNORMAL HIGH (ref 4.0–10.5)
nRBC: 0 % (ref 0.0–0.2)

## 2021-08-25 LAB — C-REACTIVE PROTEIN: CRP: 25.1 mg/dL — ABNORMAL HIGH (ref <1.0)

## 2021-08-25 LAB — SEDIMENTATION RATE: Sed Rate: 92 mm/hr — ABNORMAL HIGH (ref 0–22)

## 2021-08-25 MED ORDER — SODIUM CHLORIDE 0.9 % IV SOLN
2.0000 g | INTRAVENOUS | Status: DC
  Administered 2021-08-25 – 2021-08-26 (×2): 2 g via INTRAVENOUS
  Filled 2021-08-25 (×2): qty 20

## 2021-08-25 MED ORDER — AZITHROMYCIN 500 MG PO TABS
500.0000 mg | ORAL_TABLET | Freq: Every day | ORAL | Status: DC
  Administered 2021-08-25 – 2021-08-26 (×2): 500 mg via ORAL
  Filled 2021-08-25 (×2): qty 1

## 2021-08-25 NOTE — Progress Notes (Signed)
Physical Therapy Discharge ?Patient Details ?Name: Ana Santos ?MRN: [id number] ?DOB: Jun 06, 1941 ?Today's Date: 08/25/2021 ?Time: 2761-8485 ?PT Time Calculation (min) (ACUTE ONLY): 12 min ? ?Patient discharged from PT services secondary to goals met and no further PT needs identified. ? ?Please see latest therapy progress note for current level of functioning and progress toward goals.   ? ?Progress and discharge plan discussed with patient and/or caregiver: Patient/Caregiver agrees with plan ? ?GP ?   ? ?Earnie Bechard F Brooke Steinhilber ?08/25/2021, 1:45 PM  ?Serena Petterson M,PT ?Acute Rehab Services ?[phone] ?[phone] (pager)  ?

## 2021-08-25 NOTE — Progress Notes (Addendum)
PROGRESS NOTE  Ana Santos  ION:[id number] DOB: September 27, 1940 DOA: 08/23/2021 PCP: Loyola Mast, MD   Brief Narrative: Patient is a 81 year old female with history of acoustic neuroma, cerebral angiogram status post coiling, bladder cancer, COPD, hypertension, hyperlipidemia, hypothyroidism who was admitted for lumbar fusion surgery under orthopedic service.  Patient is a current smoker, smokes 4 to 5 cigarettes a day.  She is not on home oxygen.  We we are consulted for further evaluation of new onset hypoxia.  Assessment & Plan:  Principal Problem:   Fusion of spine of lumbar region Active Problems:   Postoperative anemia due to acute blood loss   Hypothyroidism   Mixed hyperlipidemia   Tobacco use disorder, continuous   COPD (chronic obstructive pulmonary disease) with chronic bronchitis (HCC)   RLS (restless legs syndrome)   Essential hypertension   Oxygen desaturation   Community acquired bilateral lower lobe pneumonia   Assessment and Plan: * Fusion of spine of lumbar region -Patient with chronic back pain -Lumber fusion was performed here and she appears to be doing well from this standpoint -Management per Dr. Otelia Sergeant.  Postoperative anemia due to acute blood loss -Hgb slightly lower than pre-op but not significantly so -Unlikely to be related to any complaints by the patient at this time  Community acquired bilateral lower lobe pneumonia Progressive hypoxia with finding of bilateral lower lobe infiltrates on chest x-ray, worsening leukocytosis.  Started on antibiotics  Oxygen desaturation -She has had desaturation to 90% and is on [place] O2 -Chest x-ray showed bilateral lower lobe infiltration/atelectasis.  Patient also developed leukocytosis. -Will start on antibiotics to cover for community-acquired pneumonia. -Continue [place] O2 prn  -Smoking cessation is essential  Essential hypertension -Continue amlodipine  RLS (restless legs syndrome) -Continue  Mirapex  COPD (chronic obstructive pulmonary disease) with chronic bronchitis (HCC) -Likely contributing to her current hypoxia -She may require long-term O2 therapy -We will check if she qualifies for home oxygen -Added Spiriva and Albuterol prn  Tobacco use disorder, continuous -Encourage cessation.   -Patch declined by patient  Mixed hyperlipidemia -Continue Pravastatin  Hypothyroidism -Continue Synthroid              DVT prophylaxis:SCD's Start: 08/23/21 1428     Code Status: Full Code   Procedures:Lumbar fusion  Antimicrobials:  Anti-infectives (From admission, onward)    Start     Dose/Rate Route Frequency Ordered Stop   08/23/21 1600  ceFAZolin (ANCEF) IVPB 2g/100 mL premix        2 g 200 mL/hr over 30 Minutes Intravenous Every 8 hours 08/23/21 1427 08/24/21 0012   08/23/21 0600  ceFAZolin (ANCEF) IVPB 2g/100 mL premix        2 g 200 mL/hr over 30 Minutes Intravenous On call to O.R. 08/23/21 0557 08/23/21 0830       Subjective:  Patient seen and examined at the bedside this morning.  Hemodynamically stable.  She looked comfortable today, sitting on the chair, she was on room air during my evaluation.  She did not complain of any shortness of breath but was coughing.  She mentioned that she is eager to go home  Objective: Vitals:   08/24/21 1541 08/24/21 1930 08/24/21 2320 08/25/21 0301  BP: (!) 111/51 (!) 142/57 138/67 121/65  Pulse: 90 80 93 100  Resp: 16 16 19 17   Temp: 98.6 F (37 C) 99.1 F (37.3 C) 99.2 F (37.3 C) 99 F (37.2 C)  TempSrc: Oral Oral Oral Oral  SpO2: 95% 92%  97% 94%  Weight:      Height:        Intake/Output Summary (Last 24 hours) at 08/25/2021 0744 Last data filed at 08/25/2021 0043 Gross per 24 hour  Intake 1340 ml  Output --  Net 1340 ml   Filed Weights   08/23/21 1610  Weight: 51.3 kg    Examination:  General exam: Overall comfortable, not in distress, pleasant elderly female, deconditioned HEENT:  PERRL Respiratory system:  no wheezes or crackles  Cardiovascular system: S1 & S2 heard, RRR.  Gastrointestinal system: Abdomen is nondistended, soft and nontender. Central nervous system: Alert and oriented Extremities: No edema, no clubbing ,no cyanosis Skin: No rashes, no ulcers,no icterus     Data Reviewed: I have personally reviewed following labs and imaging studies  CBC: Recent Labs  Lab 08/24/21 0343 08/24/21 1111  WBC 13.0* 15.6*  NEUTROABS  --  13.3*  HGB 8.6* 9.8*  HCT 27.2* 30.5*  MCV 90.7 91.3  PLT 224 237   Basic Metabolic Panel: Recent Labs  Lab 08/24/21 0343  NA 137  K 3.8  CL 108  CO2 20*  GLUCOSE 106*  BUN 10  CREATININE 0.96  CALCIUM 8.6*     Recent Results (from the past 240 hour(s))  Surgical pcr screen     Status: None   Collection Time: 08/15/21  8:24 AM   Specimen: Nasal Mucosa; Nasal Swab  Result Value Ref Range Status   MRSA, PCR NEGATIVE NEGATIVE Final   Staphylococcus aureus NEGATIVE NEGATIVE Final    Comment: (NOTE) The Xpert SA Assay (FDA approved for NASAL specimens in patients 28 years of age and older), is one component of a comprehensive surveillance program. It is not intended to diagnose infection nor to guide or monitor treatment. Performed at [facility], [address] [place], [place] [address]   SARS CORONAVIRUS 2 (TAT 6-24 HRS) Nasopharyngeal Nasopharyngeal Swab     Status: None   Collection Time: 08/19/21  9:00 AM   Specimen: Nasopharyngeal Swab  Result Value Ref Range Status   SARS Coronavirus 2 NEGATIVE NEGATIVE Final    Comment: (NOTE) SARS-CoV-2 target nucleic acids are NOT DETECTED.  The SARS-CoV-2 RNA is generally detectable in upper and lower respiratory specimens during the acute phase of infection. Negative results do not preclude SARS-CoV-2 infection, do not rule out co-infections with other pathogens, and should not be used as the sole basis for treatment or other patient management  decisions. Negative results must be combined with clinical observations, patient history, and epidemiological information. The expected result is Negative.  Fact Sheet for Patients: HairSlick.no  Fact Sheet for Healthcare Providers: quierodirigir.com  This test is not yet approved or cleared by the [place] FDA and  has been authorized for detection and/or diagnosis of SARS-CoV-2 by FDA under an Emergency Use Authorization (EUA). This EUA will remain  in effect (meaning this test can be used) for the duration of the COVID-19 declaration under Se ction 564(b)(1) of the Act, 21 U.S.C. section 360bbb-3(b)(1), unless the authorization is terminated or revoked sooner.  Performed at [facility], [address] [place], [place] [address]      Radiology Studies: DG Lumbar Spine Complete  Result Date: 08/23/2021 CLINICAL DATA:  Lumbar fusion EXAM: LUMBAR SPINE - COMPLETE 4 VIEW COMPARISON:  None. FINDINGS: Fluoroscopic images were obtained intraoperatively and submitted for post operative interpretation. Posterior fusion of the lumbar spine with hardware in expected position, 4 images were obtained with  94.3 seconds of fluoroscopy time and 39.8 mGy. Please see the performing provider's procedural report for further detail. IMPRESSION: As above. Electronically Signed   By: Allegra Lai M.D.   On: 08/23/2021 11:24   DG CHEST PORT 1 VIEW  Result Date: 08/24/2021 CLINICAL DATA:  Hypoxia EXAM: PORTABLE CHEST 1 VIEW COMPARISON:  08/15/2021 FINDINGS: Cardiac size is within normal limits. There is evidence of interval surgical fusion in the thoracolumbar spine. There is new patchy infiltrate in the left lower lung fields. Left lateral CP angle is indistinct. There is no pneumothorax. IMPRESSION: New patchy infiltrates are seen in the left lower lung fields suggesting atelectasis/pneumonia. Electronically Signed   By: Ernie Avena M.D.   On: 08/24/2021 12:53   DG C-Arm 1-60 Min-No Report  Result Date: 08/23/2021 Fluoroscopy was utilized by the requesting physician.  No radiographic interpretation.   DG C-Arm 1-60 Min-No Report  Result Date: 08/23/2021 Fluoroscopy was utilized by the requesting physician.  No radiographic interpretation.   DG C-Arm 1-60 Min-No Report  Result Date: 08/23/2021 Fluoroscopy was utilized by the requesting physician.  No radiographic interpretation.    Scheduled Meds:  amLODipine  5 mg Oral Daily   cholecalciferol  5,000 Units Oral Daily   clopidogrel  75 mg Oral Daily   docusate sodium  100 mg Oral BID   ferrous gluconate  324 mg Oral BID WC   gabapentin  100 mg Oral TID   levothyroxine  88 mcg Oral Q0600   oxyCODONE  10 mg Oral Q12H   pantoprazole  40 mg Oral QHS   polyvinyl alcohol  1 drop Both Eyes QHS   pravastatin  40 mg Oral Daily   sertraline  50 mg Oral Daily   sodium chloride flush  3 mL Intravenous Q12H   umeclidinium bromide  1 puff Inhalation Daily   Continuous Infusions:  sodium chloride 250 mL (08/23/21 1515)     LOS: 1 day   Burnadette Pop, MD [facility] P3/16/2023, 7:44 AM

## 2021-08-25 NOTE — Assessment & Plan Note (Addendum)
Progressive hypoxia with finding of bilateral lower lobe infiltrates on chest x-ray, worsening leukocytosis.  Started on antibiotics.  She got 2 doses of azithromycin and ceftriaxone.  She can be discharged on 3 days course of Augmentin starting from tomorrow.  Please provide Mucinex prescription on discharge ?

## 2021-08-25 NOTE — Progress Notes (Signed)
Mobility Specialist: Progress Note ? ? 08/25/21 1536  ?Mobility  ?Activity Ambulated with assistance in hallway  ?Level of Assistance Standby assist, set-up cues, supervision of patient - no hands on  ?Assistive Device Front wheel walker  ?Distance Ambulated (ft) 370 ft  ?Activity Response Tolerated well  ?$Mobility charge 1 Mobility  ? ?Received pt in chair having no complaints and agreeable to mobility. Asymptomatic throughout ambulation, returned back to chair w/ call bell in reach and all needs met. ? ?Ana Santos ?Mobility Specialist ?Mobility Specialist [place]: [phone] ?Mobility Specialist [place]: [phone] ? ?

## 2021-08-25 NOTE — Progress Notes (Signed)
? ? ? ?  Subjective: ?2 Days Post-Op Procedure(s) (LRB): ?LEFT LUMBBAR ONE-TWOTRANSFORAMINAL LUMBAR INTERBODY FUSION WITH GLOBUS PEDICLE SCREWS, RODS AND SABLE ADJUSTABLE CAGE, LOCAL BONE GRAFT, ALLOGRAFT BONE GRAFT, VIVIGEN (N/A) ?Awake, alert and some SOB but improved with antibiotics and oxygen. ?Voiding without difficulty.  ?Patient reports pain as moderate.   ? ?Objective:  ? ?VITALS:  Temp:  [98 ?F (36.7 ?C)-99.2 ?F (37.3 ?C)] 98 ?F (36.7 ?C) (03/16 1542) ?Pulse Rate:  [67-100] 87 (03/16 1542) ?Resp:  [14-19] 17 (03/16 1542) ?BP: (95-145)/(51-67) 145/54 (03/16 1542) ?SpO2:  [92 %-100 %] 99 % (03/16 1542) ?FiO2 (%):  [28 %] 28 % (03/16 0843) ? ?Neurologically intact ?ABD soft ?Neurovascular intact ?Sensation intact distally ?Intact pulses distally ?Dorsiflexion/Plantar flexion intact ?Incision: dressing C/D/I, no drainage, and scant drainage ? ? ?LABS ?Recent Labs  ?  08/24/21 ?0343 08/24/21 ?1111 08/25/21 ?1151  ?HGB 8.6* 9.8* 9.2*  ?WBC 13.0* 15.6* 16.4*  ?PLT 224 237 243  ? ?Recent Labs  ?  08/24/21 ?0343  ?NA 137  ?K 3.8  ?CL 108  ?CO2 20*  ?BUN 10  ?CREATININE 0.96  ?GLUCOSE 106*  ? ?No results for input(s): LABPT, INR in the last 72 hours. ? ? ?Assessment/Plan: ?2 Days Post-Op Procedure(s) (LRB): ?LEFT LUMBBAR ONE-TWOTRANSFORAMINAL LUMBAR INTERBODY FUSION WITH GLOBUS PEDICLE SCREWS, RODS AND SABLE ADJUSTABLE CAGE, LOCAL BONE GRAFT, ALLOGRAFT BONE GRAFT, VIVIGEN (N/A) ? ?Advance diet ?Up with therapy ?D/C IV fluids ?Continue ABX therapy due to Post-op infection nosocomial pneumonia ?Will keep tonight and discharge home in the AM on augment for  ? ?Ana Santos ?08/25/2021, 5:41 PM Patient ID: Ana Santos, female   DOB: Apr 18, 1941, 81 y.o.   MRN: [id number] ? ?

## 2021-08-25 NOTE — Progress Notes (Signed)
Physical Therapy Treatment and D/C ?Patient Details ?Name: Ana Santos ?MRN: [id number] ?DOB: 09/24/1940 ?Today's Date: 08/25/2021 ? ? ?History of Present Illness 81 y.o. female presents to [facility] hospital on 08/23/2021 with L1-2 spondylosis. PT underwent L L1-2 TLIF on 3/14. PMH includes anxiety, bladder cancer, COPD, depression, HTN, HLD, CVA. ? ?  ?PT Comments  ? ? Pt admitted with above diagnosis. Pt was able to ambulate with RW and ascend and descend steps with good safety with modif I.  Pt reeducated regarding precautions and pt verbalizes understanding. Pt met goals and does not need any further PT.  Pt agrees. Will sign off.   ?Recommendations for follow up therapy are one component of a multi-disciplinary discharge planning process, led by the attending physician.  Recommendations may be updated based on patient status, additional functional criteria and insurance authorization. ? ?Follow Up Recommendations ? No PT follow up ?  ?  ?Assistance Recommended at Discharge PRN  ?Patient can return home with the following Assistance with cooking/housework ?  ?Equipment Recommendations ? None recommended by PT  ?  ?Recommendations for Other Services   ? ? ?  ?Precautions / Restrictions Precautions ?Precautions: Back ?Precaution Booklet Issued: Yes (comment) ?Required Braces or Orthoses: Spinal Brace ?Spinal Brace: Lumbar corset;Applied in sitting position ?Restrictions ?Weight Bearing Restrictions: No  ?  ? ?Mobility ? Bed Mobility ?Overal bed mobility: Modified Independent ?  ?  ?  ?  ?  ?  ?General bed mobility comments: slow/deliberate for pain management. good log roll. ?  ? ?Transfers ?Overall transfer level: Modified independent ?Equipment used: None ?Transfers: Sit to/from Stand ?Sit to Stand: Modified independent (Device/Increase time) ?  ?  ?  ?  ?  ?  ?  ? ?Ambulation/Gait ?Ambulation/Gait assistance: Modified independent (Device/Increase time) ?Gait Distance (Feet): 400 Feet ?Assistive device: Rolling  walker (2 wheels) ?Gait Pattern/deviations: Step-through pattern ?Gait velocity: reduced ?Gait velocity interpretation: 1.31 - 2.62 ft/sec, indicative of limited community ambulator ?  ?General Gait Details: Pt safe with RW and can withstand challenges. ? ? ?Stairs ?Stairs: Yes ?Stairs assistance: Modified independent (Device/Increase time) ?Stair Management: One rail Right, One rail Left, Forwards, Alternating pattern ?Number of Stairs: 12 ?General stair comments: No difficulty up and down stairs. ? ? ?Wheelchair Mobility ?  ? ?Modified Rankin (Stroke Patients Only) ?  ? ? ?  ?Balance Overall balance assessment: Needs assistance ?Sitting-balance support: No upper extremity supported, Feet supported ?Sitting balance-Leahy Scale: Good ?  ?  ?Standing balance support: No upper extremity supported, During functional activity ?Standing balance-Leahy Scale: Fair ?Standing balance comment: stood at sink for ~5 minutes to groom without UE support ?  ?  ?  ?  ?  ?  ?  ?  ?  ?  ?  ?  ? ?  ?Cognition Arousal/Alertness: Awake/alert ?Behavior During Therapy: [facility] for tasks assessed/performed ?Overall Cognitive Status: Within Functional Limits for tasks assessed ?  ?  ?  ?  ?  ?  ?  ?  ?  ?  ?  ?  ?  ?  ?  ?  ?General Comments: recalled with no assist ?  ?  ? ?  ?Exercises   ? ?  ?General Comments General comments (skin integrity, edema, etc.): VSS on RA, Dons/doffs brace on her own ?  ?  ? ?Pertinent Vitals/Pain Pain Assessment ?Pain Assessment: 0-10 ?Pain Score: 4  ?Pain Location: back ?Pain Descriptors / Indicators: Sore ?Pain Intervention(s): Limited activity within patient's tolerance, Monitored during session,  Repositioned  ? ? ?Home Living   ?  ?  ?  ?  ?  ?  ?  ?  ?  ?   ?  ?Prior Function    ?  ?  ?   ? ?PT Goals (current goals can now be found in the care plan section) Acute Rehab PT Goals ?Patient Stated Goal: to return home ?PT Goal Formulation: All assessment and education complete, DC therapy ?Progress towards PT  goals: Goals met/education completed, patient discharged from PT ? ?  ?Frequency ? ? ? Min 5X/week ? ? ? ?  ?PT Plan Current plan remains appropriate  ? ? ?Co-evaluation   ?  ?  ?  ?  ? ?  ?AM-PAC PT "6 Clicks" Mobility   ?Outcome Measure ? Help needed turning from your back to your side while in a flat bed without using bedrails?: None ?Help needed moving from lying on your back to sitting on the side of a flat bed without using bedrails?: None ?Help needed moving to and from a bed to a chair (including a wheelchair)?: None ?Help needed standing up from a chair using your arms (e.g., wheelchair or bedside chair)?: None ?Help needed to walk in hospital room?: None ?Help needed climbing 3-5 steps with a railing? : None ?6 Click Score: 24 ? ?  ?End of Session Equipment Utilized During Treatment: Back brace ?Activity Tolerance: Patient tolerated treatment well ?Patient left: in chair;with call bell/phone within reach;with chair alarm set ?Nurse Communication: Mobility status;Precautions ?PT Visit Diagnosis: Other abnormalities of gait and mobility (R26.89) ?  ? ? ?Time: 9702-6378 ?PT Time Calculation (min) (ACUTE ONLY): 12 min ? ?Charges:  $Gait Training: 8-22 mins          ?          ? ?Terrika Zuver M,PT ?Acute Rehab Services ?[phone] ?[phone] (pager)  ? ? ?Larkyn Greenberger F Reyna Lorenzi ?08/25/2021, 1:42 PM ? ?

## 2021-08-26 DIAGNOSIS — M4326 Fusion of spine, lumbar region: Secondary | ICD-10-CM | POA: Diagnosis not present

## 2021-08-26 MED ORDER — OXYCODONE HCL 5 MG PO TABS
5.0000 mg | ORAL_TABLET | ORAL | 0 refills | Status: DC | PRN
Start: 2021-08-26 — End: 2021-09-07

## 2021-08-26 MED ORDER — AZITHROMYCIN 500 MG PO TABS: 500.0000 mg | ORAL_TABLET | Freq: Every day | ORAL | 0 refills | Status: DC

## 2021-08-26 MED ORDER — ALBUTEROL SULFATE HFA 108 (90 BASE) MCG/ACT IN AERS: 2.0000 | INHALATION_SPRAY | Freq: Four times a day (QID) | RESPIRATORY_TRACT | Status: DC | PRN

## 2021-08-26 MED ORDER — GUAIFENESIN ER 600 MG PO TB12: 600.0000 mg | ORAL_TABLET | Freq: Two times a day (BID) | ORAL | 1 refills | Status: DC

## 2021-08-26 MED ORDER — FERROUS GLUCONATE 324 (38 FE) MG PO TABS: 324.0000 mg | ORAL_TABLET | Freq: Two times a day (BID) | ORAL | 1 refills | Status: DC

## 2021-08-26 MED ORDER — DOCUSATE SODIUM 100 MG PO CAPS: 100.0000 mg | ORAL_CAPSULE | Freq: Two times a day (BID) | ORAL | 0 refills | Status: DC

## 2021-08-26 MED ORDER — GABAPENTIN 100 MG PO CAPS: 100.0000 mg | ORAL_CAPSULE | Freq: Three times a day (TID) | ORAL | 0 refills | Status: DC

## 2021-08-26 MED ORDER — OXYCODONE HCL ER 10 MG PO T12A: 10.0000 mg | EXTENDED_RELEASE_TABLET | Freq: Two times a day (BID) | ORAL | 0 refills | Status: DC

## 2021-08-26 NOTE — Progress Notes (Signed)
3/17 Patient left the hospital before receiving the IM Letter. IM Letter mailed to patient's home address. ?

## 2021-08-26 NOTE — Care Management Important Message (Signed)
Important Message ? ?Patient Details  ?Name: Ana Santos ?MRN: [id number] ?Date of Birth: 02-22-1941 ? ? ?Medicare Important Message Given:  Other (see comment) ? ? ? ? ?Levada Dy  Shainna Faux-Martin ?08/26/2021, 11:39 AM ?

## 2021-08-26 NOTE — Progress Notes (Signed)
?PROGRESS NOTE ? ?Ana Santos  JIR:[id number] DOB: April 16, 1941 DOA: 08/23/2021 ?PCP: Haydee Salter, MD  ? ?Brief Narrative: ?Patient is a 81 year old female with history of acoustic neuroma, cerebral angiogram status post coiling, bladder cancer, COPD, hypertension, hyperlipidemia, hypothyroidism who was admitted for lumbar fusion surgery under orthopedic service.  Patient is a current smoker, smokes 4 to 5 cigarettes a day.  She is not on home oxygen.  We we are consulted for further evaluation of new onset hypoxia.  Chest x-ray could not rule out bilateral lower lobe pneumonia, started on antibiotics.  Clinically doing better.  Planning for discharge to home with oral antibiotics today. ? ?Assessment & Plan: ? ?Principal Problem: ?  Fusion of spine of lumbar region ?Active Problems: ?  Postoperative anemia due to acute blood loss ?  Hypothyroidism ?  Mixed hyperlipidemia ?  Tobacco use disorder, continuous ?  COPD (chronic obstructive pulmonary disease) with chronic bronchitis ([place]) ?  RLS (restless legs syndrome) ?  Essential hypertension ?  Oxygen desaturation ?  Community acquired bilateral lower lobe pneumonia ? ? ?Assessment and Plan: ?* Fusion of spine of lumbar region ?-Patient with chronic back pain ?-Lumber fusion was performed here and she appears to be doing well from this standpoint ?-Management per Dr. Louanne Skye. ? ?Postoperative anemia due to acute blood loss ?-Hgb slightly lower than pre-op but not significantly so ?-Unlikely to be related to any complaints by the patient at this time ? ?Community acquired bilateral lower lobe pneumonia ?Progressive hypoxia with finding of bilateral lower lobe infiltrates on chest x-ray, worsening leukocytosis.  Started on antibiotics.  She got 2 doses of azithromycin and ceftriaxone.  She can be discharged on 3 days course of Augmentin starting from tomorrow.  Please provide Mucinex prescription on discharge ? ?Oxygen desaturation ?-On room air this  morning ?-Lungs were clear on auscultation without any wheezing or rhonchi ? ?Essential hypertension ?-Continue amlodipine ? ?RLS (restless legs syndrome) ?-Continue Mirapex ? ?COPD (chronic obstructive pulmonary disease) with chronic bronchitis ([place]) ?-Likely contributing to her current hypoxia ?-She is currently on room air this morning ?-Not on inhalers at home.  Please provide prescription of albuterol on discharge ? ?Tobacco use disorder, continuous ?-Encourage cessation.   ?-Patch declined by patient ? ?Mixed hyperlipidemia ?-Continue Pravastatin ? ?Hypothyroidism ?-Continue Synthroid ? ? ? ? ?  ? ? ?  ?  ? ?DVT prophylaxis:SCD's Start: 08/23/21 1428 ? ? ?  Code Status: Full Code ? ? ?Procedures:Lumbar fusion ? ?Antimicrobials:  ?Anti-infectives (From admission, onward)  ? ? Start     Dose/Rate Route Frequency Ordered Stop  ? 08/26/21 0000  azithromycin (ZITHROMAX) 500 MG tablet       ? 500 mg Oral Daily 08/26/21 0903    ? 08/25/21 1000  azithromycin (ZITHROMAX) tablet 500 mg       ? 500 mg Oral Daily 08/25/21 0745    ? 08/25/21 0845  cefTRIAXone (ROCEPHIN) 2 g in sodium chloride 0.9 % 100 mL IVPB       ? 2 g ?200 mL/hr over 30 Minutes Intravenous Every 24 hours 08/25/21 0745    ? 08/23/21 1600  ceFAZolin (ANCEF) IVPB 2g/100 mL premix       ? 2 g ?200 mL/hr over 30 Minutes Intravenous Every 8 hours 08/23/21 1427 08/24/21 0012  ? 08/23/21 0600  ceFAZolin (ANCEF) IVPB 2g/100 mL premix       ? 2 g ?200 mL/hr over 30 Minutes Intravenous On call to O.R. 08/23/21 7510 08/23/21  0830  ? ?  ? ? ?Subjective: ? ?Patient seen and examined at the bedside this morning.  Hemodynamically stable.  Comfortable.  On room air today.  Has some cough.  Family at bedside. ? ?Objective: ?Vitals:  ? 08/25/21 2316 08/26/21 0318 08/26/21 0753 08/26/21 0759  ?BP: (!) 130/58 (!) 125/49 (!) 116/49   ?Pulse: 96 78 74   ?Resp: '14 17 15   '$ ?Temp: 99.3 ?F (37.4 ?C) 98.4 ?F (36.9 ?C) 98.7 ?F (37.1 ?C)   ?TempSrc: Oral Oral Oral   ?SpO2: 90% 94%  96% 98%  ?Weight:      ?Height:      ? ? ?Intake/Output Summary (Last 24 hours) at 08/26/2021 0947 ?Last data filed at 08/25/2021 1700 ?Gross per 24 hour  ?Intake 1527.67 ml  ?Output --  ?Net 1527.67 ml  ? ?Filed Weights  ? 08/23/21 9449  ?Weight: 51.3 kg  ? ? ?Examination: ? ?General exam: Overall comfortable, not in distress, pleasant elderly female ?HEENT: PERRL ?Respiratory system: Mild diminished air entry on bilateral bases, no wheezes or crackles  ?Cardiovascular system: S1 & S2 heard, RRR.  ?Gastrointestinal system: Abdomen is nondistended, soft and nontender. ?Central nervous system: Alert and oriented ?Extremities: No edema, no clubbing ,no cyanosis ?Skin: No rashes, no ulcers,no icterus   ? ? ?Data Reviewed: I have personally reviewed following labs and imaging studies ? ?CBC: ?Recent Labs  ?Lab 08/24/21 ?0343 08/24/21 ?1111 08/25/21 ?1151  ?WBC 13.0* 15.6* 16.4*  ?NEUTROABS  --  13.3* 13.1*  ?HGB 8.6* 9.8* 9.2*  ?HCT 27.2* 30.5* 28.6*  ?MCV 90.7 91.3 91.1  ?PLT 224 237 243  ? ?Basic Metabolic Panel: ?Recent Labs  ?Lab 08/24/21 ?6759  ?NA 137  ?K 3.8  ?CL 108  ?CO2 20*  ?GLUCOSE 106*  ?BUN 10  ?CREATININE 0.96  ?CALCIUM 8.6*  ? ? ? ?Recent Results (from the past 240 hour(s))  ?SARS CORONAVIRUS 2 (TAT 6-24 HRS) Nasopharyngeal Nasopharyngeal Swab     Status: None  ? Collection Time: 08/19/21  9:00 AM  ? Specimen: Nasopharyngeal Swab  ?Result Value Ref Range Status  ? SARS Coronavirus 2 NEGATIVE NEGATIVE Final  ?  Comment: (NOTE) ?SARS-CoV-2 target nucleic acids are NOT DETECTED. ? ?The SARS-CoV-2 RNA is generally detectable in upper and lower ?respiratory specimens during the acute phase of infection. Negative ?results do not preclude SARS-CoV-2 infection, do not rule out ?co-infections with other pathogens, and should not be used as the ?sole basis for treatment or other patient management decisions. ?Negative results must be combined with clinical observations, ?patient history, and epidemiological  information. The expected ?result is Negative. ? ?Fact Sheet for Patients: ?SugarRoll.be ? ?Fact Sheet for Healthcare Providers: ?https://www.woods-mathews.com/ ? ?This test is not yet approved or cleared by the [place] FDA and  ?has been authorized for detection and/or diagnosis of SARS-CoV-2 by ?FDA under an Emergency Use Authorization (EUA). This EUA will remain  ?in effect (meaning this test can be used) for the duration of the ?COVID-19 declaration under Se ction 564(b)(1) of the Act, 21 U.S.C. ?section 360bbb-3(b)(1), unless the authorization is terminated or ?revoked sooner. ? ?Performed at [facility], [place] [address] [place], [place] ?[address] ?  ?  ? ?Radiology Studies: ?DG CHEST PORT 1 VIEW ? ?Result Date: 08/24/2021 ?CLINICAL DATA:  Hypoxia EXAM: PORTABLE CHEST 1 VIEW COMPARISON:  08/15/2021 FINDINGS: Cardiac size is within normal limits. There is evidence of interval surgical fusion in the thoracolumbar spine. There is new patchy infiltrate in  the left lower lung fields. Left lateral CP angle is indistinct. There is no pneumothorax. IMPRESSION: New patchy infiltrates are seen in the left lower lung fields suggesting atelectasis/pneumonia. Electronically Signed   By: Elmer Picker M.D.   On: 08/24/2021 12:53   ? ?Scheduled Meds: ? amLODipine  5 mg Oral Daily  ? azithromycin  500 mg Oral Daily  ? cholecalciferol  5,000 Units Oral Daily  ? clopidogrel  75 mg Oral Daily  ? docusate sodium  100 mg Oral BID  ? ferrous gluconate  324 mg Oral BID WC  ? gabapentin  100 mg Oral TID  ? levothyroxine  88 mcg Oral Q0600  ? oxyCODONE  10 mg Oral Q12H  ? pantoprazole  40 mg Oral QHS  ? polyvinyl alcohol  1 drop Both Eyes QHS  ? pravastatin  40 mg Oral Daily  ? sertraline  50 mg Oral Daily  ? sodium chloride flush  3 mL Intravenous Q12H  ? umeclidinium bromide  1 puff Inhalation Daily  ? ?Continuous Infusions: ? sodium chloride 250 mL (08/23/21 1515)  ?  cefTRIAXone (ROCEPHIN)  IV 2 g (08/26/21 6203)  ? ? ? LOS: 2 days  ? ?Shelly Coss, MD ?[facility] ?P3/17/2023, 9:47 AM   ?

## 2021-08-26 NOTE — Progress Notes (Signed)
? ? ? ?  Subjective: ?3 Days Post-Op Procedure(s) (LRB): ?LEFT LUMBBAR ONE-TWOTRANSFORAMINAL LUMBAR INTERBODY FUSION WITH GLOBUS PEDICLE SCREWS, RODS AND SABLE ADJUSTABLE CAGE, LOCAL BONE GRAFT, ALLOGRAFT BONE GRAFT, VIVIGEN (N/A) ?Awake, alert and oriented x 4. Up to bathroom with some assistance, walking in hallway. ?Weaned from O2 and is oxygenating well on RA. Voiding and tolerating pos meds, pain meds and nourishment. I'm Ready to go.  ?Patient reports pain as moderate.   ? ?Objective:  ? ?VITALS:  Temp:  [98 ?F (36.7 ?C)-99.6 ?F (37.6 ?C)] 98.7 ?F (37.1 ?C) (03/17 0753) ?Pulse Rate:  [67-96] 74 (03/17 0753) ?Resp:  [14-17] 15 (03/17 0753) ?BP: (95-145)/(49-58) 116/49 (03/17 0753) ?SpO2:  [90 %-99 %] 98 % (03/17 0759) ? ?Neurologically intact ?ABD soft ?Neurovascular intact ?Sensation intact distally ?Intact pulses distally ?Dorsiflexion/Plantar flexion intact ?Incision: dressing C/D/I and no drainage ?No cellulitis present ?Compartment soft ? ? ?LABS ?Recent Labs  ?  08/24/21 ?0343 08/24/21 ?1111 08/25/21 ?1151  ?HGB 8.6* 9.8* 9.2*  ?WBC 13.0* 15.6* 16.4*  ?PLT 224 237 243  ? ?Recent Labs  ?  08/24/21 ?0343  ?NA 137  ?K 3.8  ?CL 108  ?CO2 20*  ?BUN 10  ?CREATININE 0.96  ?GLUCOSE 106*  ? ?No results for input(s): LABPT, INR in the last 72 hours. ? ? ?Assessment/Plan: ?3 Days Post-Op Procedure(s) (LRB): ?LEFT LUMBBAR ONE-TWOTRANSFORAMINAL LUMBAR INTERBODY FUSION WITH GLOBUS PEDICLE SCREWS, RODS AND SABLE ADJUSTABLE CAGE, LOCAL BONE GRAFT, ALLOGRAFT BONE GRAFT, VIVIGEN (N/A) ?Community Acquired pneumonia, improving on Day #2 zpak. ?Anemia due to blood loss ? ?Advance diet ?Up with therapy ?D/C IV fluids ?Continue ABX therapy due to Post-op respiratory infection, history of COPD ?Discharge home with home health ? ?[name] ?08/26/2021, 8:47 AM Patient ID: Ana Santos, female   DOB: 20-Jan-1941, 81 y.o.   MRN: [id number] ? ?

## 2021-08-29 ENCOUNTER — Other Ambulatory Visit: Payer: Self-pay

## 2021-08-30 NOTE — Discharge Summary (Signed)
? ?Patient ID: ?Ana Santos ?MRN: [id number] ?DOB/AGE: 81-Oct-1942 81 y.o. ? ?Admit date: 08/23/2021 ?Discharge date: 08/26/2021 ? ?Admission Diagnoses:  ?Principal Problem: ?  Fusion of spine of lumbar region ?Active Problems: ?  Hypothyroidism ?  Mixed hyperlipidemia ?  Tobacco use disorder, continuous ?  COPD (chronic obstructive pulmonary disease) with chronic bronchitis ([place]) ?  RLS (restless legs syndrome) ?  Essential hypertension ?  Oxygen desaturation ?  Postoperative anemia due to acute blood loss ?  Community acquired bilateral lower lobe pneumonia ? ? ?Discharge Diagnoses:  ?Principal Problem: ?  Fusion of spine of lumbar region ?Active Problems: ?  Hypothyroidism ?  Mixed hyperlipidemia ?  Tobacco use disorder, continuous ?  COPD (chronic obstructive pulmonary disease) with chronic bronchitis ([place]) ?  RLS (restless legs syndrome) ?  Essential hypertension ?  Oxygen desaturation ?  Postoperative anemia due to acute blood loss ?  Community acquired bilateral lower lobe pneumonia ? status post Procedure(s): ?LEFT LUMBBAR ONE-TWOTRANSFORAMINAL LUMBAR INTERBODY FUSION WITH GLOBUS PEDICLE SCREWS, RODS AND SABLE ADJUSTABLE CAGE, LOCAL BONE GRAFT, ALLOGRAFT BONE GRAFT, VIVIGEN ? ?Past Medical History:  ?Diagnosis Date  ? AN (acoustic neuroma) ([place])   ? deafness in R ear  ? Aneurysm ([place])   ? 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 Anxiety   ? Bladder cancer ([place])   ? Cataract   ? Cigarette smoker   ? COPD (chronic obstructive pulmonary disease) ([place])   ? mild, no inhalers or oxygen used  ? Coronary artery disease   ? no current cardiologist  pt. denies  ? Depression   ? Headache   ? tension headaches due to aneurysm repair with stent  ? Hyperlipidemia   ? Hypertension   ? Hypothyroidism   ? IBS (irritable bowel syndrome)   ? Mitral valve prolapse   ? mild takes beta blocker  ? Osteopenia   ? Stroke [facility])   ? x 3 ministrokes last one feb 2014  ? Venous insufficiency   ? Vitamin D deficiency    ? ? ?Surgeries: Procedure(s): ?LEFT LUMBBAR ONE-TWOTRANSFORAMINAL LUMBAR INTERBODY FUSION WITH GLOBUS PEDICLE SCREWS, RODS AND SABLE ADJUSTABLE CAGE, LOCAL BONE GRAFT, ALLOGRAFT BONE GRAFT, VIVIGEN on 08/23/2021 ?  ?Consultants:  ? ?Discharged Condition: Improved ? ?Hospital Course: Ana Santos is an 81 y.o. female who was admitted 08/23/2021 for operative treatment of Fusion of spine of lumbar region. Patient failed conservative treatments (please see the history and physical for the specifics) and had severe unremitting pain that affects sleep, daily activities and work/hobbies. After pre-op clearance, the patient was taken to the operating room on 08/23/2021 and underwent  Procedure(s): ?LEFT LUMBBAR ONE-TWOTRANSFORAMINAL LUMBAR INTERBODY FUSION WITH GLOBUS PEDICLE SCREWS, RODS AND SABLE ADJUSTABLE CAGE, LOCAL BONE GRAFT, ALLOGRAFT BONE GRAFT, VIVIGEN.   ? ?Patient was given perioperative antibiotics:  ?Anti-infectives (From admission, onward)  ? ? Start     Dose/Rate Route Frequency Ordered Stop  ? 08/26/21 0000  azithromycin (ZITHROMAX) 500 MG tablet       ? 500 mg Oral Daily 08/26/21 0903    ? 08/25/21 1000  azithromycin (ZITHROMAX) tablet 500 mg  Status:  Discontinued       ? 500 mg Oral Daily 08/25/21 0745 08/26/21 1615  ? 08/25/21 0845  cefTRIAXone (ROCEPHIN) 2 g in sodium chloride 0.9 % 100 mL IVPB  Status:  Discontinued       ? 2 g ?200 mL/hr over 30 Minutes Intravenous Every 24 hours 08/25/21 0745 08/26/21  1615  ? 08/23/21 1600  ceFAZolin (ANCEF) IVPB 2g/100 mL premix       ? 2 g ?200 mL/hr over 30 Minutes Intravenous Every 8 hours 08/23/21 1427 08/24/21 0012  ? 08/23/21 0600  ceFAZolin (ANCEF) IVPB 2g/100 mL premix       ? 2 g ?200 mL/hr over 30 Minutes Intravenous On call to O.R. 08/23/21 0557 08/23/21 0830  ? ?  ?  ? ?Patient was given sequential compression devices and early ambulation to prevent DVT.  ? ?Patient benefited maximally from hospital stay and there were no complications. At the time  of discharge, the patient was urinating/moving their bowels without difficulty, tolerating a regular diet, pain is controlled with oral pain medications and they have been cleared by PT/OT.  ? ?Recent vital signs: No data found.  ? ?Recent laboratory studies: No results for input(s): WBC, HGB, HCT, PLT, NA, K, CL, CO2, BUN, CREATININE, GLUCOSE, INR, CALCIUM in the last 72 hours. ? ?Invalid input(s): PT, 2 ? ? ?Discharge Medications:   ?Allergies as of 08/26/2021   ? ?   Reactions  ? Prednisone Other (See Comments)  ? Hallucinations  ? Atorvastatin Other (See Comments)  ?  Lipitor caused arm pain (3/09)  ? Hydrocodone-acetaminophen Other (See Comments)  ? hallucinations  ? Morphine Other (See Comments)  ? hallucinations  ? Nortriptyline Nausea And Vomiting  ? Caused nausea and vomiting and shaking, kept her up all night  ? Topamax [topiramate] Nausea And Vomiting  ? *dizziness*  ? Tramadol Other (See Comments)  ? withdrawal  ? ?  ? ?  ?Medication List  ?  ? ?STOP taking these medications   ? ?oxyCODONE-acetaminophen 5-325 MG tablet ?Commonly known as: PERCOCET/ROXICET ?  ? ?  ? ?TAKE these medications   ? ?acetaminophen 500 MG tablet ?Commonly known as: TYLENOL ?Take 500 mg by mouth every 6 (six) hours as needed for mild pain. ?  ?amLODipine 5 MG tablet ?Commonly known as: NORVASC ?TAKE 1 TABLET DAILY ?  ?azithromycin 500 MG tablet ?Commonly known as: ZITHROMAX ?Take 1 tablet (500 mg total) by mouth daily. ?  ?docusate sodium 100 MG capsule ?Commonly known as: COLACE ?Take 1 capsule (100 mg total) by mouth 2 (two) times daily. ?  ?ferrous gluconate 324 MG tablet ?Commonly known as: FERGON ?Take 1 tablet (324 mg total) by mouth 2 (two) times daily with a meal. ?  ?gabapentin 100 MG capsule ?Commonly known as: NEURONTIN ?Take 1 capsule (100 mg total) by mouth 3 (three) times daily. ?What changed: See the new instructions. ?  ?guaiFENesin 600 MG 12 hr tablet ?Commonly known as: Mucinex ?Take 1 tablet (600 mg total) by  mouth 2 (two) times daily. ?  ?hydroxypropyl methylcellulose / hypromellose 2.5 % ophthalmic solution ?Commonly known as: ISOPTO TEARS / GONIOVISC ?Place 1 drop into both eyes at bedtime. ?  ?levothyroxine 88 MCG tablet ?Commonly known as: SYNTHROID ?TAKE 1 TABLET DAILY ?  ?ondansetron 4 MG tablet ?Commonly known as: Zofran ?Take 1 tablet (4 mg total) by mouth every 8 (eight) hours as needed for nausea or vomiting. ?  ?oxyCODONE 10 mg 12 hr tablet ?Commonly known as: OXYCONTIN ?Take 1 tablet (10 mg total) by mouth every 12 (twelve) hours. ?  ?oxyCODONE 5 MG immediate release tablet ?Commonly known as: Oxy IR/ROXICODONE ?Take 1 tablet (5 mg total) by mouth every 3 (three) hours as needed for moderate pain ((score 4 to 6)). ?  ?pramipexole 0.5 MG tablet ?Commonly known as: Mirapex ?  Take one nightly one hour prior to going to bed as needed for restless leg syndrome. ?  ?pravastatin 40 MG tablet ?Commonly known as: PRAVACHOL ?TAKE 1 TABLET DAILY ?  ?sertraline 50 MG tablet ?Commonly known as: ZOLOFT ?Take 1 tablet (50 mg total) by mouth daily. ?  ?Vitamin D-3 125 MCG (5000 UT) Tabs ?Take 5,000 Units by mouth daily. ?  ? ?  ? ? ?Diagnostic Studies: DG Chest 2 View ? ?Result Date: 08/15/2021 ?CLINICAL DATA:  81 year old female under preoperative evaluation prior to back surgery. EXAM: CHEST - 2 VIEW COMPARISON:  Chest x-ray 05/22/2018. FINDINGS: Lung volumes are normal. Mild linear scarring in the inferior aspect of the lingula, similar to the prior study. No consolidative airspace disease. No pleural effusions. No pneumothorax. No pulmonary nodule or mass noted. Pulmonary vasculature and the cardiomediastinal silhouette are within normal limits. Atherosclerosis in the thoracic aorta. IMPRESSION: 1.  No radiographic evidence of acute cardiopulmonary disease. 2. Aortic atherosclerosis. Electronically Signed   By: Vinnie Langton M.D.   On: 08/15/2021 12:26  ? ?DG Lumbar Spine Complete ? ?Result Date: 08/23/2021 ?CLINICAL  DATA:  Lumbar fusion EXAM: LUMBAR SPINE - COMPLETE 4 VIEW COMPARISON:  None. FINDINGS: Fluoroscopic images were obtained intraoperatively and submitted for post operative interpretation. Posterior fusion

## 2021-08-31 ENCOUNTER — Ambulatory Visit (INDEPENDENT_AMBULATORY_CARE_PROVIDER_SITE_OTHER): Payer: Medicare Other | Admitting: Family Medicine

## 2021-08-31 VITALS — BP 124/66 | HR 79 | Temp 97.4°F | Ht 62.0 in | Wt 116.8 lb

## 2021-08-31 DIAGNOSIS — Z981 Arthrodesis status: Secondary | ICD-10-CM | POA: Diagnosis not present

## 2021-08-31 DIAGNOSIS — E039 Hypothyroidism, unspecified: Secondary | ICD-10-CM

## 2021-08-31 DIAGNOSIS — I10 Essential (primary) hypertension: Secondary | ICD-10-CM

## 2021-08-31 NOTE — Progress Notes (Signed)
?[facility] ?[address] ?[place] [address] ?Dept: [phone] ?Dept Fax: [phone] ? ?Office Visit ? ?Subjective:  ? ? Patient ID: Ana Santos, female    DOB: February 25, 1941, 81 y.o..   MRN: [id number] ? ?Chief Complaint  ?Patient presents with  ? Follow-up  ?  F/u after having back surgery 08/23/21. .  ? ? ?History of Present Illness: ? ?Patient is in today for a hospital follow-up. On 3/14, she underwent a left L1-2 transforaminal lumbar interbody fusion with globus pedicle screws, rods and sable adjustable cage. [name] feels she is healing well at this point, though not as quickly as she might like. She notes her wound is healing well. She has had a return of her appetite. She did experience some constipation for 5 days. She is on Colace bid and did finally use a Dulcolax. She feels her left leg pain is improving. She is currently on Oxycontin 10 mg bid and using 5 mg oxycodone for pain relief. She is using the oxycodone sparingly. She did experience two episodes  of shaking post-operatively, but that was several days ago. ? ?Past Medical History: ?Patient Active Problem List  ? Diagnosis Date Noted  ? Community acquired bilateral lower lobe pneumonia 08/25/2021  ? Oxygen desaturation 08/24/2021  ? Postoperative anemia due to acute blood loss 08/24/2021  ?  Class: Acute  ? Other secondary scoliosis, lumbar region 08/23/2021  ?  Class: Chronic  ? Fusion of spine of lumbar region 08/23/2021  ? Lumbar post-laminectomy syndrome 06/15/2021  ? Status post lumbar microdiscectomy 03/09/2021  ? Recurrent herniation of lumbar disc 02/28/2021  ? Foraminal stenosis due to intervertebral disc disease 01/21/2021  ? History of bladder cancer 01/21/2021  ? Labyrinthitis 12/08/2019  ? Left-sided low back pain without sciatica 05/05/2019  ? Visual loss, transient, bilateral 02/21/2019  ? Essential hypertension 02/21/2019  ? Colonic mass s/p lap ileocectomy  03/14/2018 03/14/2018  ? Acute lower GI bleeding 03/14/2018  ? Chronic anticoagulation 03/14/2018  ? Chronic tension-type headache, not intractable 05/31/2015  ? 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 RLS (restless legs syndrome) 07/04/2012  ? History of cerebral aneurysm 03/26/2009  ? Vitamin D deficiency 08/10/2008  ? Venous insufficiency 08/10/2008  ? History of acoustic neuroma 08/18/2007  ? Hypothyroidism 08/18/2007  ? Tobacco use disorder, continuous 08/18/2007  ? Mixed hyperlipidemia 07/18/2007  ? Anxiety with depression 07/18/2007  ? COPD (chronic obstructive pulmonary disease) with chronic bronchitis ([place]) 07/18/2007  ? Fibromyalgia 07/18/2007  ? ?Past Surgical History:  ?Procedure Laterality Date  ? ANEURYSM COILING  04/2009  ? Dr. Estanislado Pandy  ? APPENDECTOMY    ? BACK SURGERY  02/28/2021  ? BREAST LUMPECTOMY Right 1986  ? benign  ? DIAGNOSTIC LAPAROSCOPY    ? Proable laparoscopic right colectomy 03-14-18 Dr. Excell Seltzer  ? EYE SURGERY Bilateral   ? ioc lens for cataracts  ? LAPAROSCOPIC RIGHT COLECTOMY Right 03/14/2018  ? Procedure: LAPAROSCOPIC ASSISTED  RIGHT COLECTOMY;  Surgeon: Excell Seltzer, MD;  [facility];  Service: General;  Laterality: Right;  ? LAPAROSCOPY N/A 03/14/2018  ? Procedure: LAPAROSCOPY;  Surgeon: Excell Seltzer, MD;  [facility];  Service: General;  Laterality: N/A;  ? LEFT HEART CATHETERIZATION WITH CORONARY ANGIOGRAM N/A 08/05/2014  ? Procedure: LEFT HEART CATHETERIZATION WITH CORONARY ANGIOGRAM;  Surgeon: Wellington Hampshire, MD;  Location: [facility];  Service: Cardiovascular;  Laterality:  N/A;  ? LUMBAR LAMINECTOMY N/A 02/28/2021  ? Procedure: LEFT L1-L2 MICRODISCECTOMY AND FASCIECTOMY;  Surgeon: Marybelle Killings, MD;  Location: [facility];  Service: Orthopedics;  Laterality: N/A;  ? TONSILLECTOMY    ? TOTAL ABDOMINAL HYSTERECTOMY  1970  ? complete  ? TRANSLABYRINTHINE PROCEDURE   2002  ? [facility] Dr. Vicie Mutters tumor removal  ? TRANSURETHRAL RESECTION OF BLADDER TUMOR N/A 02/08/2018  ? Procedure: TRANSURETHRAL RESECTION OF BLADDER TUMOR (TURBT)/ POSTOPERATIVE INSTILLATION OF CHEMO THERAPY;  Surgeon: Festus Aloe, MD;  Location: [facility];  Service: Urology;  Laterality: N/A;  ? ?Family History  ?Problem Relation Age of Onset  ? Heart disease Mother   ? Cancer Mother   ?     Lung  ? Heart attack Mother   ? Diabetes Mother   ? Cancer Father   ?     Lung  ? Diabetes Sister   ? Cancer Sister   ?     unkown type  ? Diabetes Brother   ? Lung cancer Brother   ?     Lung  ? AAA (abdominal aortic aneurysm) Neg Hx   ? Colon cancer Neg Hx   ? Stomach cancer Neg Hx   ? Esophageal cancer Neg Hx   ? Liver cancer Neg Hx   ? Pancreatic cancer Neg Hx   ? Rectal cancer Neg Hx   ? ?Outpatient Medications Prior to Visit  ?Medication Sig Dispense Refill  ? acetaminophen (TYLENOL) 500 MG tablet Take 500 mg by mouth every 6 (six) hours as needed for mild pain.    ? amLODipine (NORVASC) 5 MG tablet TAKE 1 TABLET DAILY 90 tablet 3  ? Cholecalciferol (VITAMIN D-3) 125 MCG (5000 UT) TABS Take 5,000 Units by mouth daily.    ? clopidogrel (PLAVIX) 75 MG tablet Take 75 mg by mouth daily.    ? docusate sodium (COLACE) 100 MG capsule Take 1 capsule (100 mg total) by mouth 2 (two) times daily. 10 capsule 0  ? ferrous gluconate (FERGON) 324 MG tablet Take 1 tablet (324 mg total) by mouth 2 (two) times daily with a meal. 60 tablet 1  ? gabapentin (NEURONTIN) 100 MG capsule Take 1 capsule (100 mg total) by mouth 3 (three) times daily. 90 capsule 0  ? guaiFENesin (MUCINEX) 600 MG 12 hr tablet Take 1 tablet (600 mg total) by mouth 2 (two) times daily. 60 tablet 1  ? hydroxypropyl methylcellulose / hypromellose (ISOPTO TEARS / GONIOVISC) 2.5 % ophthalmic solution Place 1 drop into both eyes at bedtime.    ? levothyroxine (SYNTHROID) 88 MCG tablet TAKE 1 TABLET DAILY 90 tablet 3  ? ondansetron (ZOFRAN) 4 MG tablet  Take 1 tablet (4 mg total) by mouth every 8 (eight) hours as needed for nausea or vomiting. 20 tablet 0  ? oxyCODONE (OXY IR/ROXICODONE) 5 MG immediate release tablet Take 1 tablet (5 mg total) by mouth every 3 (three) hours as needed for moderate pain ((score 4 to 6)). 30 tablet 0  ? oxyCODONE (OXYCONTIN) 10 mg 12 hr tablet Take 1 tablet (10 mg total) by mouth every 12 (twelve) hours. 14 tablet 0  ? pramipexole (MIRAPEX) 0.5 MG tablet Take one nightly one hour prior to going to bed as needed for restless leg syndrome. 90 tablet 4  ? pravastatin (PRAVACHOL) 40 MG tablet TAKE 1 TABLET DAILY 90 tablet 3  ? sertraline (ZOLOFT) 50 MG tablet Take 1 tablet (50 mg total) by mouth daily. 90 tablet 3  ?  azithromycin (ZITHROMAX) 500 MG tablet Take 1 tablet (500 mg total) by mouth daily. 4 tablet 0  ? ?No facility-administered medications prior to visit.  ? ? ?Allergies  ?Allergen Reactions  ? Prednisone Other (See Comments)  ?  Hallucinations  ? Atorvastatin Other (See Comments)  ?   Lipitor caused arm pain (3/09)  ? Hydrocodone-Acetaminophen Other (See Comments)  ?  hallucinations  ? Morphine Other (See Comments)  ?  hallucinations  ? Nortriptyline Nausea And Vomiting  ?  Caused nausea and vomiting and shaking, kept her up all night  ? Topamax [Topiramate] Nausea And Vomiting  ?  *dizziness*  ? Tramadol Other (See Comments)  ?  withdrawal  ?   ?Objective:  ? ?Today's Vitals  ? 08/31/21 1118  ?BP: 124/66  ?Pulse: 79  ?Temp: (!) 97.4 ?F (36.3 ?C)  ?TempSrc: Temporal  ?SpO2: 95%  ?Weight: 116 lb 12.8 oz (53 kg)  ?Height: '5\' 2"'$  (1.575 m)  ? ?Body mass index is 21.36 kg/m?.  ? ?General: Well developed, well nourished. No acute distress. ?Neuro: Mild, fine tremor of hands. ?Psych: Alert and oriented. Normal mood and affect. ? ?Health Maintenance Due  ?Topic Date Due  ? TETANUS/TDAP  Never done  ? Zoster Vaccines- Shingrix (1 of 2) Never done  ?   ?Assessment & Plan:  ? ?1. S/P lumbar spinal fusion ?Reviewed operative report.  Progressing well. Using walker. Husband assisting with other ADLs. She will continue to follow with her surgeon. I recommend she continue to use the Colace as long as she is on opioids. We also discussed her eati

## 2021-09-03 ENCOUNTER — Other Ambulatory Visit: Payer: Self-pay | Admitting: Family Medicine

## 2021-09-07 ENCOUNTER — Other Ambulatory Visit: Payer: Self-pay | Admitting: Specialist

## 2021-09-07 MED ORDER — OXYCODONE HCL 5 MG PO TABS: 5.0000 mg | ORAL_TABLET | ORAL | 0 refills | Status: DC | PRN

## 2021-09-07 NOTE — Telephone Encounter (Signed)
Pt called requesting a refill of oxycodone. Please send to pharmacy on file. Phone number is [phone]. ?

## 2021-09-08 ENCOUNTER — Telehealth: Payer: Self-pay | Admitting: Specialist

## 2021-09-08 ENCOUNTER — Other Ambulatory Visit: Payer: Self-pay | Admitting: Specialist

## 2021-09-08 MED ORDER — OXYCODONE HCL 5 MG PO TABS: 5.0000 mg | ORAL_TABLET | ORAL | 0 refills | Status: DC | PRN

## 2021-09-08 NOTE — Telephone Encounter (Signed)
Patient called advised she is completely out of her pain medication and is in a lot of pain. Patient asked for a call when Rx is sent to the pharmacy. The number to contact patient is [phone]       [facility] [address]   [phone] ?

## 2021-09-08 NOTE — Telephone Encounter (Signed)
I called and advised that [facility] by her PCP and advised that it automatically goes to that one and not her local pharmacy. I advised that this has been sent to [facility] now ?

## 2021-09-14 ENCOUNTER — Ambulatory Visit (INDEPENDENT_AMBULATORY_CARE_PROVIDER_SITE_OTHER): Payer: Medicare Other | Admitting: Surgery

## 2021-09-14 ENCOUNTER — Ambulatory Visit: Payer: Self-pay

## 2021-09-14 ENCOUNTER — Encounter: Payer: Self-pay | Admitting: Surgery

## 2021-09-14 DIAGNOSIS — Z981 Arthrodesis status: Secondary | ICD-10-CM

## 2021-09-14 DIAGNOSIS — Z9889 Other specified postprocedural states: Secondary | ICD-10-CM

## 2021-09-14 NOTE — Progress Notes (Signed)
? ?Post-Op Visit Note ?  ?Patient: Ana Santos           ?Date of Birth: 04-23-1941           ?MRN: [id number] ?Visit Date: 09/14/2021 ?PCP: Haydee Salter, MD ? ? ?Assessment & Plan: ? ?Chief Complaint:  ?Chief Complaint  ?Patient presents with  ? Lower Back - Routine Post Op  ?  Post op 08/23/21  ?Doing well.  Preop leg symptoms resolved.  She is pleased up to this point.  ? ? ?Visit Diagnoses:  ?1. S/P lumbar fusion   ?2. Status post lumbar microdiscectomy   ? ? ?Plan: continue brace.  No lifting, pushing, pulling, twisting,driving.  F/u with Dr Louanne Skye in 4 weeks.  Return sooner if needed.  ? ?Follow-Up Instructions: Return in about 4 weeks (around 10/12/2021) for WITH DR NITKA FOR 6 WEEK POSTOP LUMBAR FUSION.  ? ?Orders:  ?Orders Placed This Encounter  ?Procedures  ? XR Lumbar Spine 2-3 Views  ? ?No orders of the defined types were placed in this encounter. ? ? ?Imaging: ?No results found. ? ?PMFS History: ?Patient Active Problem List  ? Diagnosis Date Noted  ? Community acquired bilateral lower lobe pneumonia 08/25/2021  ? Oxygen desaturation 08/24/2021  ? Postoperative anemia due to acute blood loss 08/24/2021  ?  Class: Acute  ? Other secondary scoliosis, lumbar region 08/23/2021  ?  Class: Chronic  ? Fusion of spine of lumbar region 08/23/2021  ? Lumbar post-laminectomy syndrome 06/15/2021  ? Status post lumbar microdiscectomy 03/09/2021  ? Recurrent herniation of lumbar disc 02/28/2021  ? Foraminal stenosis due to intervertebral disc disease 01/21/2021  ? History of bladder cancer 01/21/2021  ? Labyrinthitis 12/08/2019  ? Left-sided low back pain without sciatica 05/05/2019  ? Visual loss, transient, bilateral 02/21/2019  ? Essential hypertension 02/21/2019  ? Colonic mass s/p lap ileocectomy 03/14/2018 03/14/2018  ? Acute lower GI bleeding 03/14/2018  ? Chronic anticoagulation 03/14/2018  ? Chronic tension-type headache, not intractable 05/31/2015  ? Occlusion of right anterior inferior cerebellar artery  with infarction ([place]) 03/11/2014  ? 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 RLS (restless legs syndrome) 07/04/2012  ? History of cerebral aneurysm 03/26/2009  ? Vitamin D deficiency 08/10/2008  ? Venous insufficiency 08/10/2008  ? History of acoustic neuroma 08/18/2007  ? Hypothyroidism 08/18/2007  ? Tobacco use disorder, continuous 08/18/2007  ? Mixed hyperlipidemia 07/18/2007  ? Anxiety with depression 07/18/2007  ? COPD (chronic obstructive pulmonary disease) with chronic bronchitis ([place]) 07/18/2007  ? Fibromyalgia 07/18/2007  ? ?Past Medical History:  ?Diagnosis Date  ? AN (acoustic neuroma) ([place])   ? deafness in R ear  ? Aneurysm ([place])   ? 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 Anxiety   ? Bladder cancer ([place])   ? Cataract   ? Cigarette smoker   ? COPD (chronic obstructive pulmonary disease) ([place])   ? mild, no inhalers or oxygen used  ? Coronary artery disease   ? no current cardiologist  pt. denies  ? Depression   ? Headache   ? tension headaches due to aneurysm repair with stent  ? Hyperlipidemia   ? Hypertension   ? Hypothyroidism   ? IBS (irritable bowel syndrome)   ? Mitral valve prolapse   ? mild takes beta blocker  ? Osteopenia   ? Stroke [facility])   ? x 3 ministrokes last one feb 2014  ? Venous insufficiency   ? Vitamin D deficiency   ?  ?  Family History  ?Problem Relation Age of Onset  ? Heart disease Mother   ? Cancer Mother   ?     Lung  ? Heart attack Mother   ? Diabetes Mother   ? Cancer Father   ?     Lung  ? Diabetes Sister   ? Cancer Sister   ?     unkown type  ? Diabetes Brother   ? Lung cancer Brother   ?     Lung  ? AAA (abdominal aortic aneurysm) Neg Hx   ? Colon cancer Neg Hx   ? Stomach cancer Neg Hx   ? Esophageal cancer Neg Hx   ? Liver cancer Neg Hx   ? Pancreatic cancer Neg Hx   ? Rectal cancer Neg Hx   ?  ?Past Surgical History:  ?Procedure Laterality Date  ? ANEURYSM COILING  04/2009  ? Dr. Estanislado Pandy  ? APPENDECTOMY    ? BACK SURGERY  02/28/2021  ? BREAST  LUMPECTOMY Right 1986  ? benign  ? DIAGNOSTIC LAPAROSCOPY    ? Proable laparoscopic right colectomy 03-14-18 Dr. Excell Seltzer  ? EYE SURGERY Bilateral   ? ioc lens for cataracts  ? LAPAROSCOPIC RIGHT COLECTOMY Right 03/14/2018  ? Procedure: LAPAROSCOPIC ASSISTED  RIGHT COLECTOMY;  Surgeon: Excell Seltzer, MD;  [facility];  Service: General;  Laterality: Right;  ? LAPAROSCOPY N/A 03/14/2018  ? Procedure: LAPAROSCOPY;  Surgeon: Excell Seltzer, MD;  [facility];  Service: General;  Laterality: N/A;  ? LEFT HEART CATHETERIZATION WITH CORONARY ANGIOGRAM N/A 08/05/2014  ? Procedure: LEFT HEART CATHETERIZATION WITH CORONARY ANGIOGRAM;  Surgeon: Wellington Hampshire, MD;  Location: [facility];  Service: Cardiovascular;  Laterality: N/A;  ? LUMBAR LAMINECTOMY N/A 02/28/2021  ? Procedure: LEFT L1-L2 MICRODISCECTOMY AND FASCIECTOMY;  Surgeon: Marybelle Killings, MD;  Location: [facility];  Service: Orthopedics;  Laterality: N/A;  ? TONSILLECTOMY    ? TOTAL ABDOMINAL HYSTERECTOMY  1970  ? complete  ? TRANSLABYRINTHINE PROCEDURE  2002  ? [facility] Dr. Vicie Mutters tumor removal  ? TRANSURETHRAL RESECTION OF BLADDER TUMOR N/A 02/08/2018  ? Procedure: TRANSURETHRAL RESECTION OF BLADDER TUMOR (TURBT)/ POSTOPERATIVE INSTILLATION OF CHEMO THERAPY;  Surgeon: Festus Aloe, MD;  Location: [facility];  Service: Urology;  Laterality: N/A;  ? ?Social History  ? ?Occupational History  ? Occupation: alterations  ? Occupation: Retired  ?  Comment: Seamstress  ?Tobacco Use  ? Smoking status: Every Day  ?  Packs/day: 0.25  ?  Years: 50.00  ?  Pack years: 12.50  ?  Types: Cigarettes  ? Smokeless tobacco: Never  ? Tobacco comments:  ?  4-5 cigs daily  is aware she needs to quit  ?Vaping Use  ? Vaping Use: Never used  ?Substance and Sexual Activity  ? Alcohol use: No  ?  Alcohol/week: 0.0 standard drinks  ? Drug use: No  ? Sexual activity: Not Currently  ? ?Exam: ?Wound looks good.  Staples removed and steris applied.  No drainage or  signs of infection.  Neurologically intact.  ? ?

## 2021-10-05 DIAGNOSIS — Z8551 Personal history of malignant neoplasm of bladder: Secondary | ICD-10-CM | POA: Diagnosis not present

## 2021-10-11 ENCOUNTER — Ambulatory Visit: Payer: Medicare Other

## 2021-10-12 ENCOUNTER — Ambulatory Visit (INDEPENDENT_AMBULATORY_CARE_PROVIDER_SITE_OTHER): Payer: Medicare Other

## 2021-10-12 ENCOUNTER — Ambulatory Visit (INDEPENDENT_AMBULATORY_CARE_PROVIDER_SITE_OTHER): Payer: Medicare Other | Admitting: Specialist

## 2021-10-12 ENCOUNTER — Encounter: Payer: Self-pay | Admitting: Specialist

## 2021-10-12 ENCOUNTER — Telehealth: Payer: Self-pay | Admitting: Specialist

## 2021-10-12 VITALS — BP 181/77 | HR 66 | Ht 62.0 in | Wt 117.0 lb

## 2021-10-12 DIAGNOSIS — R5082 Postprocedural fever: Secondary | ICD-10-CM

## 2021-10-12 DIAGNOSIS — F17200 Nicotine dependence, unspecified, uncomplicated: Secondary | ICD-10-CM

## 2021-10-12 DIAGNOSIS — Z981 Arthrodesis status: Secondary | ICD-10-CM | POA: Diagnosis not present

## 2021-10-12 MED ORDER — HYDROCODONE-ACETAMINOPHEN 10-325 MG PO TABS: 0.5000 | ORAL_TABLET | ORAL | 0 refills | Status: DC | PRN

## 2021-10-12 NOTE — Patient Instructions (Signed)
? ? ?  Call if there is increasing drainage, fever greater than 101.5, severe head aches, and ?worsening nausea or light sensitivity. ?If shortness of breath, bloody cough or chest tightness or pain go to an emergency room. ?No lifting greater than 10 lbs. ?Avoid bending, stooping and twisting. ?Use brace when sitting and out of bed even to go to bathroom. ?Walk start to get out slowly increasing distances up to one quarter mile by 4-6 weeks post op. ?May shower and change dressing following bathing with shower.When ?bathing remove the brace shower and replace brace before getting out of the shower. ?Blood work today, CBC, sed rate and CRP. ?Please call and return for scheduled follow up appointment in 6 weeks. ?  ?

## 2021-10-12 NOTE — Progress Notes (Signed)
? ?Post-Op Visit Note ?  ?Patient: Ana Santos           ?Date of Birth: Mar 30, 1941           ?MRN: [id number] ?Visit Date: 10/12/2021 ?PCP: Haydee Salter, MD ? ? ?Assessment & Plan: 6 weeks post op Lumbar TLIF L1-2 ? ?Chief Complaint:  ?Chief Complaint  ?Patient presents with  ? Lower Back - Routine Post Op  ?Reports that she had a low grade fever 101 yesterday. Increased back pain and this is worse after a period of decreased pain. ?Wearing her brace. ?Motor is normal ?SLR in normal ?Radiographs.  ?Visit Diagnoses:  ?1. S/P lumbar fusion   ? ? ?Plan:  ?Call if there is increasing drainage, fever greater than 101.5, severe head aches, and ?worsening nausea or light sensitivity. ?If shortness of breath, bloody cough or chest tightness or pain go to an emergency room. ?No lifting greater than 10 lbs. ?Avoid bending, stooping and twisting. ?Use brace when sitting and out of bed even to go to bathroom. ?Walk start to get out slowly increasing distances up to one quarter mile by 4-6 weeks post op. ?May shower and change dressing following bathing with shower.When ?bathing remove the brace shower and replace brace before getting out of the shower. ?Blood work today, CBC, sed rate and CRP. ?Please call and return for scheduled follow up appointment in 6 weeks. ?  ?  ? ?Follow-Up Instructions: Return in about 4 weeks (around 11/09/2021).  ? ?Orders:  ?Orders Placed This Encounter  ?Procedures  ? XR Lumbar Spine 2-3 Views  ? ?No orders of the defined types were placed in this encounter. ? ? ?Imaging: ?No results found. ? ?PMFS History: ?Patient Active Problem List  ? Diagnosis Date Noted  ? Other secondary scoliosis, lumbar region 08/23/2021  ?  Priority: High  ?  Class: Chronic  ? Postoperative anemia due to acute blood loss 08/24/2021  ?  Priority: Medium   ?  Class: Acute  ? Community acquired bilateral lower lobe pneumonia 08/25/2021  ? Oxygen desaturation 08/24/2021  ? Fusion of spine of lumbar region 08/23/2021   ? Lumbar post-laminectomy syndrome 06/15/2021  ? Status post lumbar microdiscectomy 03/09/2021  ? Recurrent herniation of lumbar disc 02/28/2021  ? Foraminal stenosis due to intervertebral disc disease 01/21/2021  ? History of bladder cancer 01/21/2021  ? Labyrinthitis 12/08/2019  ? Left-sided low back pain without sciatica 05/05/2019  ? Visual loss, transient, bilateral 02/21/2019  ? Essential hypertension 02/21/2019  ? Colonic mass s/p lap ileocectomy 03/14/2018 03/14/2018  ? Acute lower GI bleeding 03/14/2018  ? Chronic anticoagulation 03/14/2018  ? Chronic tension-type headache, not intractable 05/31/2015  ? 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 RLS (restless legs syndrome) 07/04/2012  ? History of cerebral aneurysm 03/26/2009  ? Vitamin D deficiency 08/10/2008  ? Venous insufficiency 08/10/2008  ? History of acoustic neuroma 08/18/2007  ? Hypothyroidism 08/18/2007  ? Tobacco use disorder, continuous 08/18/2007  ? Mixed hyperlipidemia 07/18/2007  ? Anxiety with depression 07/18/2007  ? COPD (chronic obstructive pulmonary disease) with chronic bronchitis ([place]) 07/18/2007  ? Fibromyalgia 07/18/2007  ? ?Past Medical History:  ?Diagnosis Date  ? AN (acoustic neuroma) ([place])   ? deafness in R ear  ? Aneurysm ([place])   ? 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 Anxiety   ? Bladder cancer ([place])   ? Cataract   ?  Cigarette smoker   ? COPD (chronic obstructive pulmonary disease) ([place])   ? mild, no inhalers or oxygen used  ? Coronary artery disease   ? no current cardiologist  pt. denies  ? Depression   ? Headache   ? tension headaches due to aneurysm repair with stent  ? Hyperlipidemia   ? Hypertension   ? Hypothyroidism   ? IBS (irritable bowel syndrome)   ? Mitral valve prolapse   ? mild takes beta blocker  ? Osteopenia   ? Stroke [facility])   ? x 3 ministrokes last one feb 2014  ? Venous insufficiency   ?  Vitamin D deficiency   ?  ?Family History  ?Problem Relation Age of Onset  ? Heart disease Mother   ? Cancer Mother   ?     Lung  ? Heart attack Mother   ? Diabetes Mother   ? Cancer Father   ?     Lung  ? Diabetes Sister   ? Cancer Sister   ?     unkown type  ? Diabetes Brother   ? Lung cancer Brother   ?     Lung  ? AAA (abdominal aortic aneurysm) Neg Hx   ? Colon cancer Neg Hx   ? Stomach cancer Neg Hx   ? Esophageal cancer Neg Hx   ? Liver cancer Neg Hx   ? Pancreatic cancer Neg Hx   ? Rectal cancer Neg Hx   ?  ?Past Surgical History:  ?Procedure Laterality Date  ? ANEURYSM COILING  04/2009  ? Dr. Estanislado Pandy  ? APPENDECTOMY    ? BACK SURGERY  02/28/2021  ? BREAST LUMPECTOMY Right 1986  ? benign  ? DIAGNOSTIC LAPAROSCOPY    ? Proable laparoscopic right colectomy 03-14-18 Dr. Excell Seltzer  ? EYE SURGERY Bilateral   ? ioc lens for cataracts  ? LAPAROSCOPIC RIGHT COLECTOMY Right 03/14/2018  ? Procedure: LAPAROSCOPIC ASSISTED  RIGHT COLECTOMY;  Surgeon: Excell Seltzer, MD;  [facility];  Service: General;  Laterality: Right;  ? LAPAROSCOPY N/A 03/14/2018  ? Procedure: LAPAROSCOPY;  Surgeon: Excell Seltzer, MD;  [facility];  Service: General;  Laterality: N/A;  ? LEFT HEART CATHETERIZATION WITH CORONARY ANGIOGRAM N/A 08/05/2014  ? Procedure: LEFT HEART CATHETERIZATION WITH CORONARY ANGIOGRAM;  Surgeon: Wellington Hampshire, MD;  Location: [facility];  Service: Cardiovascular;  Laterality: N/A;  ? LUMBAR LAMINECTOMY N/A 02/28/2021  ? Procedure: LEFT L1-L2 MICRODISCECTOMY AND FASCIECTOMY;  Surgeon: Marybelle Killings, MD;  Location: [facility];  Service: Orthopedics;  Laterality: N/A;  ? TONSILLECTOMY    ? TOTAL ABDOMINAL HYSTERECTOMY  1970  ? complete  ? TRANSLABYRINTHINE PROCEDURE  2002  ? [facility] Dr. Vicie Mutters tumor removal  ? TRANSURETHRAL RESECTION OF BLADDER TUMOR N/A 02/08/2018  ? Procedure: TRANSURETHRAL RESECTION OF BLADDER TUMOR (TURBT)/ POSTOPERATIVE INSTILLATION OF CHEMO THERAPY;  Surgeon: Festus Aloe, MD;   Location: [facility];  Service: Urology;  Laterality: N/A;  ? ?Social History  ? ?Occupational History  ? Occupation: alterations  ? Occupation: Retired  ?  Comment: Seamstress  ?Tobacco Use  ? Smoking status: Every Day  ?  Packs/day: 0.25  ?  Years: 50.00  ?  Pack years: 12.50  ?  Types: Cigarettes  ? Smokeless tobacco: Never  ? Tobacco comments:  ?  4-5 cigs daily  is aware she needs to quit  ?Vaping Use  ? Vaping Use: Never used  ?Substance and Sexual Activity  ? Alcohol use: No  ?  Alcohol/week: 0.0 standard drinks  ? Drug use: No  ? Sexual activity: Not Currently  ? ? ? ?

## 2021-10-12 NOTE — Telephone Encounter (Signed)
Dr. Louanne Skye will e-scribe--the system was down this morning when she was here. ?

## 2021-10-12 NOTE — Telephone Encounter (Signed)
Please call in Medication --pharmacy will not take written script  ?

## 2021-10-13 ENCOUNTER — Other Ambulatory Visit: Payer: Self-pay | Admitting: Specialist

## 2021-10-13 LAB — CBC WITH DIFFERENTIAL/PLATELET
Absolute Monocytes: 672 cells/uL (ref 200–950)
Basophils Absolute: 45 cells/uL (ref 0–200)
Basophils Relative: 0.4 %
Eosinophils Absolute: 0 cells/uL — ABNORMAL LOW (ref 15–500)
Eosinophils Relative: 0 %
HCT: 37 % (ref 35.0–45.0)
Hemoglobin: 11.7 g/dL (ref 11.7–15.5)
Lymphs Abs: 2677 cells/uL (ref 850–3900)
MCH: 27.9 pg (ref 27.0–33.0)
MCHC: 31.6 g/dL — ABNORMAL LOW (ref 32.0–36.0)
MCV: 88.1 fL (ref 80.0–100.0)
MPV: 10.4 fL (ref 7.5–12.5)
Monocytes Relative: 6 %
Neutro Abs: 7806 cells/uL — ABNORMAL HIGH (ref 1500–7800)
Neutrophils Relative %: 69.7 %
Platelets: 344 10*3/uL (ref 140–400)
RBC: 4.2 10*6/uL (ref 3.80–5.10)
RDW: 13.2 % (ref 11.0–15.0)
Total Lymphocyte: 23.9 %
WBC: 11.2 10*3/uL — ABNORMAL HIGH (ref 3.8–10.8)

## 2021-10-13 LAB — SEDIMENTATION RATE: Sed Rate: 51 mm/h — ABNORMAL HIGH (ref 0–30)

## 2021-10-13 LAB — C-REACTIVE PROTEIN: CRP: 6.7 mg/L (ref <8.0)

## 2021-10-13 NOTE — Telephone Encounter (Signed)
Pt is calling to get a refill of pain medication --it was not called in on 10-12-21 ?

## 2021-10-14 ENCOUNTER — Telehealth: Payer: Self-pay | Admitting: Specialist

## 2021-10-14 MED ORDER — OXYCODONE HCL 5 MG PO TABS: 5.0000 mg | ORAL_TABLET | ORAL | 0 refills | Status: DC | PRN

## 2021-10-14 NOTE — Progress Notes (Signed)
Lab work shows no sign of infection all of the tests are less indicative of an infectious process compared with tests done one month ago.

## 2021-10-14 NOTE — Telephone Encounter (Signed)
Patient called. She would like hydrocodone called in for her, her call back number is [phone] ?

## 2021-10-17 ENCOUNTER — Other Ambulatory Visit: Payer: Self-pay | Admitting: Specialist

## 2021-10-17 MED ORDER — HYDROCODONE-ACETAMINOPHEN 10-325 MG PO TABS: 0.5000 | ORAL_TABLET | ORAL | 0 refills | Status: DC | PRN

## 2021-10-17 NOTE — Addendum Note (Signed)
Addended by: Minda Ditto, Alyse Low N on: 10/17/2021 10:05 AM ? ? Modules accepted: Orders ? ?

## 2021-10-17 NOTE — Telephone Encounter (Signed)
Pt called and states that she was suppose to have hydrocodone called on on 05/03. Pt is very upset and wants to know if we can retry to send it.  ?

## 2021-10-21 ENCOUNTER — Telehealth: Payer: Self-pay | Admitting: Radiology

## 2021-10-21 ENCOUNTER — Other Ambulatory Visit: Payer: Self-pay | Admitting: Specialist

## 2021-10-21 MED ORDER — OXYCODONE HCL 5 MG PO TABS: 5.0000 mg | ORAL_TABLET | ORAL | 0 refills | Status: DC | PRN

## 2021-10-21 NOTE — Telephone Encounter (Signed)
[facility] called asking abut the Hydrocodone rx, I advised that they should cancel it as Dr. Louanne Skye sent her in Oxycodone to her local pharmacy today ?

## 2021-10-28 ENCOUNTER — Telehealth: Payer: Self-pay | Admitting: Family Medicine

## 2021-10-28 NOTE — Telephone Encounter (Signed)
Left message for patient to call back and schedule Medicare Annual Wellness Visit (AWV).   Please offer to do virtually or by telephone.  Left office number and my jabber #[phone].  Last AWV:01/29/2020  Please schedule at anytime with Nurse Health Advisor.   

## 2021-11-14 ENCOUNTER — Telehealth: Payer: Self-pay | Admitting: Family Medicine

## 2021-11-14 ENCOUNTER — Telehealth: Payer: Self-pay | Admitting: Specialist

## 2021-11-14 NOTE — Telephone Encounter (Signed)
Left message for patient to call back and schedule Medicare Annual Wellness Visit (AWV).   Please offer to do virtually or by telephone.  Left office number and my jabber #[phone].  Last AWV:01/29/2020  Please schedule at anytime with Nurse Health Advisor.   

## 2021-11-14 NOTE — Addendum Note (Signed)
Addended by: Minda Ditto, Alyse Low N on: 11/14/2021 02:02 PM   Modules accepted: Orders

## 2021-11-14 NOTE — Telephone Encounter (Signed)
Patient called in requesting Rx refill on pain medication

## 2021-11-15 ENCOUNTER — Other Ambulatory Visit: Payer: Self-pay | Admitting: Specialist

## 2021-11-15 MED ORDER — HYDROCODONE-ACETAMINOPHEN 10-325 MG PO TABS: 0.5000 | ORAL_TABLET | ORAL | 0 refills | Status: DC | PRN

## 2021-11-15 NOTE — Telephone Encounter (Signed)
Please send the Pain meds to [facility] in [place] --[facility] takes 5+ days

## 2021-11-15 NOTE — Telephone Encounter (Signed)
Dr. Louanne Skye sent this in

## 2021-11-23 ENCOUNTER — Ambulatory Visit (INDEPENDENT_AMBULATORY_CARE_PROVIDER_SITE_OTHER): Payer: Medicare Other | Admitting: Specialist

## 2021-11-23 ENCOUNTER — Encounter: Payer: Self-pay | Admitting: Specialist

## 2021-11-23 ENCOUNTER — Ambulatory Visit (INDEPENDENT_AMBULATORY_CARE_PROVIDER_SITE_OTHER): Payer: Medicare Other

## 2021-11-23 VITALS — BP 183/63 | HR 63 | Ht 62.0 in | Wt 117.0 lb

## 2021-11-23 DIAGNOSIS — M961 Postlaminectomy syndrome, not elsewhere classified: Secondary | ICD-10-CM

## 2021-11-23 DIAGNOSIS — Z981 Arthrodesis status: Secondary | ICD-10-CM | POA: Diagnosis not present

## 2021-11-23 MED ORDER — HYDROCODONE-ACETAMINOPHEN 10-325 MG PO TABS: 0.5000 | ORAL_TABLET | ORAL | 0 refills | Status: DC | PRN

## 2021-11-23 NOTE — Progress Notes (Signed)
Post-Op Visit Note   Patient: Ana Santos           Date of Birth: 04/15/1941           MRN: [id number] Visit Date: 11/23/2021 PCP: Haydee Salter, MD   Assessment & Plan: 12 weeks post op L1-2 fusion for recurrent HNP with persistent back pain and stiffness.   Chief Complaint:  Chief Complaint  Patient presents with   Lower Back - Follow-up   Visit Diagnoses:  1. S/P lumbar fusion   Motor with mild left hip flexion weakness. Incision is healed Radiographs TLIF L1-2 overall appears to be healing without any acute signs of hardware abnormality.   Plan: Avoid frequent bending and stooping  No lifting greater than 10 lbs. May use ice or moist heat for pain. Weight loss is of benefit. MRI of the lumbar spine with and without contrast Lab tests for assessment of inflamation or infection. Exercise is important to improve your indurance and does allow people to function better inspite of back pain.  Follow-Up Instructions: Return in about 4 weeks (around 12/21/2021).   Orders:  Orders Placed This Encounter  Procedures   XR Lumbar Spine 2-3 Views   No orders of the defined types were placed in this encounter.   Imaging: XR Lumbar Spine 2-3 Views  Result Date: 11/23/2021 AP and lateral flexion and extension radiographs of the thoracolumbar spine demonstrate pedicle screws and rods and interbody cage in good position and alignment. With flexion and extension there is less than 0.5 mm of difference in measurements across similar points over the anterior fusion site.    PMFS History: Patient Active Problem List   Diagnosis Date Noted   Other secondary scoliosis, lumbar region 08/23/2021    Priority: High    Class: Chronic   Postoperative anemia due to acute blood loss 08/24/2021    Priority: Medium     Class: Acute   Community acquired bilateral lower lobe pneumonia 08/25/2021   Oxygen desaturation 08/24/2021   Fusion of spine of lumbar region 08/23/2021   Lumbar  post-laminectomy syndrome 06/15/2021   Status post lumbar microdiscectomy 03/09/2021   Recurrent herniation of lumbar disc 02/28/2021   Foraminal stenosis due to intervertebral disc disease 01/21/2021   History of bladder cancer 01/21/2021   Labyrinthitis 12/08/2019   Left-sided low back pain without sciatica 05/05/2019   Visual loss, transient, bilateral 02/21/2019   Essential hypertension 02/21/2019   Colonic mass s/p lap ileocectomy 03/14/2018 03/14/2018   Acute lower GI bleeding 03/14/2018   Chronic anticoagulation 03/14/2018   Chronic tension-type headache, not intractable 05/31/2015   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 ([place]) 03/11/2014   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   RLS (restless legs syndrome) 07/04/2012   History of cerebral aneurysm 03/26/2009   Vitamin D deficiency 08/10/2008   Venous insufficiency 08/10/2008   History of acoustic neuroma 08/18/2007   Hypothyroidism 08/18/2007   Tobacco use disorder, continuous 08/18/2007   Mixed hyperlipidemia 07/18/2007   Anxiety with depression 07/18/2007   COPD (chronic obstructive pulmonary disease) with chronic bronchitis ([place]) 07/18/2007   Fibromyalgia 07/18/2007   Past Medical History:  Diagnosis Date   AN (acoustic neuroma) ([place])    deafness in R ear   Aneurysm ([place])    basilar tip aneursym s/p stent assisted coiling 04/26/09   Anxiety    Bladder cancer ([place])    Cataract    Cigarette smoker    COPD (chronic obstructive pulmonary disease) (  HCC)    mild, no inhalers or oxygen used   Coronary artery disease    no current cardiologist  pt. denies   Depression    Headache    tension headaches due to aneurysm repair with stent   Hyperlipidemia    Hypertension    Hypothyroidism    IBS (irritable bowel syndrome)    Mitral valve prolapse    mild takes beta blocker   Osteopenia    Stroke ([place])    x 3 ministrokes last one feb 2014   Venous insufficiency    Vitamin D  deficiency     Family History  Problem Relation Age of Onset   Heart disease Mother    Cancer Mother        Lung   Heart attack Mother    Diabetes Mother    Cancer Father        Lung   Diabetes Sister    Cancer Sister        unkown type   Diabetes Brother    Lung cancer Brother        Lung   AAA (abdominal aortic aneurysm) Neg Hx    Colon cancer Neg Hx    Stomach cancer Neg Hx    Esophageal cancer Neg Hx    Liver cancer Neg Hx    Pancreatic cancer Neg Hx    Rectal cancer Neg Hx     Past Surgical History:  Procedure Laterality Date   ANEURYSM COILING  04/2009   Dr. Estanislado Pandy   APPENDECTOMY     BACK SURGERY  02/28/2021   BREAST LUMPECTOMY Right 1986   benign   DIAGNOSTIC LAPAROSCOPY     Proable laparoscopic right colectomy 03-14-18 Dr. Excell Seltzer   EYE SURGERY Bilateral    ioc lens for cataracts   LAPAROSCOPIC RIGHT COLECTOMY Right 03/14/2018   Procedure: LAPAROSCOPIC ASSISTED  RIGHT COLECTOMY;  Surgeon: Excell Seltzer, MD;  [facility];  Service: General;  Laterality: Right;   LAPAROSCOPY N/A 03/14/2018   Procedure: LAPAROSCOPY;  Surgeon: Excell Seltzer, MD;  [facility];  Service: General;  Laterality: N/A;   LEFT HEART CATHETERIZATION WITH CORONARY ANGIOGRAM N/A 08/05/2014   Procedure: LEFT HEART CATHETERIZATION WITH CORONARY ANGIOGRAM;  Surgeon: Wellington Hampshire, MD;  Location: [facility];  Service: Cardiovascular;  Laterality: N/A;   LUMBAR LAMINECTOMY N/A 02/28/2021   Procedure: LEFT L1-L2 MICRODISCECTOMY AND FASCIECTOMY;  Surgeon: Marybelle Killings, MD;  Location: [facility];  Service: Orthopedics;  Laterality: N/A;   TONSILLECTOMY     TOTAL ABDOMINAL HYSTERECTOMY  1970   complete   TRANSLABYRINTHINE PROCEDURE  2002   [facility] Dr. Vicie Mutters tumor removal   TRANSURETHRAL RESECTION OF BLADDER TUMOR N/A 02/08/2018   Procedure: TRANSURETHRAL RESECTION OF BLADDER TUMOR (TURBT)/ POSTOPERATIVE INSTILLATION OF CHEMO THERAPY;  Surgeon: Festus Aloe, MD;  Location:  [facility];  Service: Urology;  Laterality: N/A;   Social History   Occupational History   Occupation: alterations   Occupation: Retired    Comment: Regulatory affairs officer  Tobacco Use   Smoking status: Every Day    Packs/day: 0.25    Years: 50.00    Total pack years: 12.50    Types: Cigarettes   Smokeless tobacco: Never   Tobacco comments:    4-5 cigs daily  is aware she needs to quit  Vaping Use   Vaping Use: Never used  Substance and Sexual Activity   Alcohol use: No    Alcohol/week: 0.0 standard drinks of alcohol  Drug use: No   Sexual activity: Not Currently

## 2021-11-23 NOTE — Patient Instructions (Signed)
Avoid frequent bending and stooping  No lifting greater than 10 lbs. May use ice or moist heat for pain. Weight loss is of benefit. MRI of the lumbar spine with and without contrast Lab tests for assessment of inflamation or infection. Exercise is important to improve your indurance and does allow people to function better inspite of back pain.

## 2021-11-24 LAB — C-REACTIVE PROTEIN: CRP: 1.5 mg/L (ref <8.0)

## 2021-11-24 LAB — SEDIMENTATION RATE: Sed Rate: 14 mm/h (ref 0–30)

## 2021-12-01 ENCOUNTER — Ambulatory Visit (INDEPENDENT_AMBULATORY_CARE_PROVIDER_SITE_OTHER): Payer: Medicare Other | Admitting: Family Medicine

## 2021-12-01 VITALS — BP 130/66 | HR 57 | Temp 97.9°F | Ht 60.0 in | Wt 112.4 lb

## 2021-12-01 DIAGNOSIS — I10 Essential (primary) hypertension: Secondary | ICD-10-CM | POA: Diagnosis not present

## 2021-12-01 DIAGNOSIS — M961 Postlaminectomy syndrome, not elsewhere classified: Secondary | ICD-10-CM | POA: Diagnosis not present

## 2021-12-01 DIAGNOSIS — E039 Hypothyroidism, unspecified: Secondary | ICD-10-CM

## 2021-12-01 DIAGNOSIS — E782 Mixed hyperlipidemia: Secondary | ICD-10-CM

## 2021-12-01 DIAGNOSIS — F418 Other specified anxiety disorders: Secondary | ICD-10-CM | POA: Diagnosis not present

## 2021-12-01 LAB — LDL CHOLESTEROL, DIRECT: Direct LDL: 91 mg/dL

## 2021-12-01 LAB — LIPID PANEL
Cholesterol: 189 mg/dL (ref 0–200)
HDL: 55.8 mg/dL (ref >39.00)
NonHDL: 133.68
Total CHOL/HDL Ratio: 3
Triglycerides: 214 mg/dL — ABNORMAL HIGH (ref 0.0–149.0)
VLDL: 42.8 mg/dL — ABNORMAL HIGH (ref 0.0–40.0)

## 2021-12-01 LAB — TSH: TSH: 0.22 u[IU]/mL — ABNORMAL LOW (ref 0.35–5.50)

## 2021-12-01 LAB — T4, FREE: Free T4: 1.1 ng/dL (ref 0.60–1.60)

## 2021-12-01 MED ORDER — SERTRALINE HCL 50 MG PO TABS: 75.0000 mg | ORAL_TABLET | Freq: Every day | ORAL | 3 refills | Status: DC

## 2021-12-01 NOTE — Progress Notes (Signed)
[facility] [address] [place] [address] Dept: [phone] Dept Fax: [phone]  Chronic Care Office Visit  Subjective:    Patient ID: TAHNEE CIFUENTES, female    DOB: 05/14/1941, 81 y.o..   MRN: [id number]  Chief Complaint  Patient presents with   Follow-up    3 month f/u.      History of Present Illness:  Patient is in today for reassessment of chronic medical issues.  Ms. Edgren has a history of hypertension, managed with amlodipine 5 mg daily. She also has prior mitral valve disease and PVS. She also has hyperlipidemia, which is managed with pravastatin 40 mg daily.   Ms. Griffiths has a history of hypothyroidism. She is managed on levothyroxine 88 mcg daily. Her last TSH was slightly low after a dosage adjustment, but during a time soon after surgery.     Ms. Estala has a history of a fall in Sept. 2021. An MRI scan in may showed multi-level degenerative lumbar disease with foraminal stenoses. She had multiple ESIs without improvement. She had a left L1-L2 microdiscectomy on 02/28/2021. She had a lumbar fusion on 08/23/2021 due to recurrent disc herniation s/p microdiscectomy. She is back to having back pain. She notes an MRI scan is planned. she is managing her pain with 1/2 tab of Vicodin 10-325 twice a day. She would prefer to be off of this, but notes the pain has been persistent. She feels this is getting her mood down.  Past Medical History: Patient Active Problem List   Diagnosis Date Noted   Oxygen desaturation 08/24/2021   Other secondary scoliosis, lumbar region 08/23/2021    Class: Chronic   Fusion of spine of lumbar region 08/23/2021   Lumbar post-laminectomy syndrome 06/15/2021   Status post lumbar microdiscectomy 03/09/2021   Recurrent herniation of lumbar disc 02/28/2021   Foraminal stenosis due to intervertebral disc disease 01/21/2021   History of bladder cancer 01/21/2021   Labyrinthitis  12/08/2019   Left-sided low back pain without sciatica 05/05/2019   Visual loss, transient, bilateral 02/21/2019   Essential hypertension 02/21/2019   Colonic mass s/p lap ileocectomy 03/14/2018 03/14/2018   Acute lower GI bleeding 03/14/2018   Chronic anticoagulation 03/14/2018   Chronic tension-type headache, not intractable 05/31/2015   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 ([place]) 03/11/2014   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   RLS (restless legs syndrome) 07/04/2012   History of cerebral aneurysm 03/26/2009   Vitamin D deficiency 08/10/2008   Venous insufficiency 08/10/2008   History of acoustic neuroma 08/18/2007   Hypothyroidism 08/18/2007   Tobacco use disorder, continuous 08/18/2007   Mixed hyperlipidemia 07/18/2007   Anxiety with depression 07/18/2007   COPD (chronic obstructive pulmonary disease) with chronic bronchitis ([place]) 07/18/2007   Fibromyalgia 07/18/2007   Past Surgical History:  Procedure Laterality Date   ANEURYSM COILING  04/2009   Dr. Estanislado Pandy   APPENDECTOMY     BACK SURGERY  02/28/2021   BREAST LUMPECTOMY Right 1986   benign   DIAGNOSTIC LAPAROSCOPY     Proable laparoscopic right colectomy 03-14-18 Dr. Excell Seltzer   EYE SURGERY Bilateral    ioc lens for cataracts   LAPAROSCOPIC RIGHT COLECTOMY Right 03/14/2018   Procedure: LAPAROSCOPIC ASSISTED  RIGHT COLECTOMY;  Surgeon: Excell Seltzer, MD;  [facility];  Service: General;  Laterality: Right;   LAPAROSCOPY N/A 03/14/2018   Procedure: LAPAROSCOPY;  Surgeon: Excell Seltzer, MD;  [facility];  Service: General;  Laterality: N/A;   LEFT HEART CATHETERIZATION WITH CORONARY ANGIOGRAM N/A 08/05/2014   Procedure: LEFT HEART CATHETERIZATION WITH CORONARY ANGIOGRAM;  Surgeon: Wellington Hampshire, MD;  Location: [facility];  Service: Cardiovascular;  Laterality: N/A;   LUMBAR LAMINECTOMY N/A 02/28/2021   Procedure: LEFT L1-L2 MICRODISCECTOMY AND FASCIECTOMY;   Surgeon: Marybelle Killings, MD;  Location: [facility];  Service: Orthopedics;  Laterality: N/A;   TONSILLECTOMY     TOTAL ABDOMINAL HYSTERECTOMY  1970   complete   TRANSLABYRINTHINE PROCEDURE  2002   [facility] Dr. Vicie Mutters tumor removal   TRANSURETHRAL RESECTION OF BLADDER TUMOR N/A 02/08/2018   Procedure: TRANSURETHRAL RESECTION OF BLADDER TUMOR (TURBT)/ POSTOPERATIVE INSTILLATION OF CHEMO THERAPY;  Surgeon: Festus Aloe, MD;  [facility];  Service: Urology;  Laterality: N/A;   Family History  Problem Relation Age of Onset   Heart disease Mother    Cancer Mother        Lung   Heart attack Mother    Diabetes Mother    Cancer Father        Lung   Diabetes Sister    Cancer Sister        unkown type   Diabetes Brother    Lung cancer Brother        Lung   AAA (abdominal aortic aneurysm) Neg Hx    Colon cancer Neg Hx    Stomach cancer Neg Hx    Esophageal cancer Neg Hx    Liver cancer Neg Hx    Pancreatic cancer Neg Hx    Rectal cancer Neg Hx    Outpatient Medications Prior to Visit  Medication Sig Dispense Refill   acetaminophen (TYLENOL) 500 MG tablet Take 500 mg by mouth every 6 (six) hours as needed for mild pain.     amLODipine (NORVASC) 5 MG tablet TAKE 1 TABLET DAILY 90 tablet 3   Cholecalciferol (VITAMIN D-3) 125 MCG (5000 UT) TABS Take 5,000 Units by mouth daily.     clopidogrel (PLAVIX) 75 MG tablet Take 75 mg by mouth daily.     docusate sodium (COLACE) 100 MG capsule Take 1 capsule (100 mg total) by mouth 2 (two) times daily. 10 capsule 0   guaiFENesin (MUCINEX) 600 MG 12 hr tablet Take 1 tablet (600 mg total) by mouth 2 (two) times daily. 60 tablet 1   HYDROcodone-acetaminophen (NORCO) 10-325 MG tablet Take 0.5 tablets by mouth every 4 (four) hours as needed. 21 tablet 0   hydroxypropyl methylcellulose / hypromellose (ISOPTO TEARS / GONIOVISC) 2.5 % ophthalmic solution Place 1 drop into both eyes at bedtime.     levothyroxine (SYNTHROID) 88 MCG tablet  TAKE 1 TABLET DAILY 90 tablet 3   ondansetron (ZOFRAN) 4 MG tablet Take 1 tablet (4 mg total) by mouth every 8 (eight) hours as needed for nausea or vomiting. 20 tablet 0   oxyCODONE (OXY IR/ROXICODONE) 5 MG immediate release tablet Take 1 tablet (5 mg total) by mouth every 3 (three) hours as needed for moderate pain ((score 4 to 6)). 30 tablet 0   pramipexole (MIRAPEX) 0.5 MG tablet Take one nightly one hour prior to going to bed as needed for restless leg syndrome. 90 tablet 4   pravastatin (PRAVACHOL) 40 MG tablet TAKE 1 TABLET DAILY 90 tablet 3   sertraline (ZOLOFT) 50 MG tablet Take 1 tablet (50 mg total) by mouth daily. 90 tablet 3   ferrous gluconate (FERGON) 324 MG tablet Take 1 tablet (324 mg total) by  mouth 2 (two) times daily with a meal. 60 tablet 1   gabapentin (NEURONTIN) 100 MG capsule Take 1 capsule (100 mg total) by mouth 3 (three) times daily. 90 capsule 0   No facility-administered medications prior to visit.   Allergies  Allergen Reactions   Prednisone Other (See Comments)    Hallucinations   Atorvastatin Other (See Comments)     Lipitor caused arm pain (3/09)   Hydrocodone-Acetaminophen Other (See Comments)    hallucinations   Morphine Other (See Comments)    hallucinations   Nortriptyline Nausea And Vomiting    Caused nausea and vomiting and shaking, kept her up all night   Topamax [Topiramate] Nausea And Vomiting    *dizziness*   Tramadol Other (See Comments)    withdrawal   Objective:   Today's Vitals   12/01/21 0817  BP: 130/66  Pulse: (!) 57  Temp: 97.9 F (36.6 C)  TempSrc: Temporal  SpO2: 96%  Weight: 112 lb 6.4 oz (51 kg)  Height: 5' (1.524 m)   Body mass index is 21.95 kg/m.   General: Well developed, well nourished. No acute distress. Psych: Alert and oriented. Normal mood and affect.  Health Maintenance Due  Topic Date Due   TETANUS/TDAP  Never done   Zoster Vaccines- Shingrix (1 of 2) Never done      12/01/2021    8:21 AM 08/03/2021     9:10 AM 05/03/2021    8:57 AM  Depression screen PHQ 2/9  Decreased Interest '1 2 1  '$ Down, Depressed, Hopeless '3 3 2  '$ PHQ - 2 Score '4 5 3  '$ Altered sleeping 0 3 0  Tired, decreased energy '3 3 3  '$ Change in appetite '3 3 3  '$ Feeling bad or failure about yourself  1 0 0  Trouble concentrating 0 3 2  Moving slowly or fidgety/restless 0 0 0  Suicidal thoughts 0 2 0  PHQ-9 Score '11 19 11  '$ Difficult doing work/chores Not difficult at all Not difficult at all Not difficult at all     Assessment & Plan:   1. Anxiety with depression Although her PHQ is improved, I still feel she is struggling with depressive symptoms. I will increase Ms. Willert Zoloft to 75 mg daily.  - sertraline (ZOLOFT) 50 MG tablet; Take 1.5 tablets (75 mg total) by mouth daily.  Dispense: 135 tablet; Refill: 3  2. Essential hypertension Blood pressure is adequately managed on amlodipine 5 mg daily.  3. Acquired hypothyroidism Due for repeat TSH. If this remains low, we may adjust down on her levothyroxine dose. - TSH - T4, free  4. Mixed hyperlipidemia Due for repeat lipids. Continue pravastatin 40 mg daily. - Lipid panel  5. Lumbar post-laminectomy syndrome We did discuss that further surgery may not resolve her pain. We discussed how to manage at home, despite the ongoing pain issues.   Return in about 3 months (around 03/03/2022) for Reassessment.   Haydee Salter, MD

## 2021-12-02 MED ORDER — LEVOTHYROXINE SODIUM 75 MCG PO TABS: 75.0000 ug | ORAL_TABLET | Freq: Every day | ORAL | 3 refills | Status: DC

## 2021-12-10 ENCOUNTER — Ambulatory Visit
Admission: RE | Admit: 2021-12-10 | Discharge: 2021-12-10 | Disposition: A | Discharge status: Home / Self Care | Payer: Medicare Other | Source: Ambulatory Visit | Attending: Specialist | Admitting: Specialist

## 2021-12-10 DIAGNOSIS — M48061 Spinal stenosis, lumbar region without neurogenic claudication: Secondary | ICD-10-CM | POA: Diagnosis not present

## 2021-12-10 DIAGNOSIS — M47816 Spondylosis without myelopathy or radiculopathy, lumbar region: Secondary | ICD-10-CM | POA: Diagnosis not present

## 2021-12-10 DIAGNOSIS — M4319 Spondylolisthesis, multiple sites in spine: Secondary | ICD-10-CM | POA: Diagnosis not present

## 2021-12-10 DIAGNOSIS — Z981 Arthrodesis status: Secondary | ICD-10-CM

## 2021-12-10 DIAGNOSIS — M47817 Spondylosis without myelopathy or radiculopathy, lumbosacral region: Secondary | ICD-10-CM | POA: Diagnosis not present

## 2021-12-10 DIAGNOSIS — M961 Postlaminectomy syndrome, not elsewhere classified: Secondary | ICD-10-CM

## 2021-12-10 MED ORDER — GADOBENATE DIMEGLUMINE 529 MG/ML IV SOLN
10.0000 mL | Freq: Once | INTRAVENOUS | Status: AC | PRN
  Administered 2021-12-10: 10 mL via INTRAVENOUS

## 2021-12-10 MED ORDER — GADOBENATE DIMEGLUMINE 529 MG/ML IV SOLN: 10.0000 mL | Freq: Once | INTRAVENOUS | Status: DC | PRN

## 2021-12-12 ENCOUNTER — Other Ambulatory Visit: Payer: Self-pay | Admitting: Family Medicine

## 2021-12-12 DIAGNOSIS — E782 Mixed hyperlipidemia: Secondary | ICD-10-CM

## 2021-12-15 ENCOUNTER — Ambulatory Visit (INDEPENDENT_AMBULATORY_CARE_PROVIDER_SITE_OTHER): Payer: Medicare Other | Admitting: Specialist

## 2021-12-15 ENCOUNTER — Encounter: Payer: Self-pay | Admitting: Specialist

## 2021-12-15 VITALS — BP 151/65 | HR 60 | Ht 60.0 in | Wt 112.5 lb

## 2021-12-15 DIAGNOSIS — R5082 Postprocedural fever: Secondary | ICD-10-CM | POA: Diagnosis not present

## 2021-12-15 DIAGNOSIS — M961 Postlaminectomy syndrome, not elsewhere classified: Secondary | ICD-10-CM | POA: Diagnosis not present

## 2021-12-15 DIAGNOSIS — M47812 Spondylosis without myelopathy or radiculopathy, cervical region: Secondary | ICD-10-CM

## 2021-12-15 DIAGNOSIS — M5417 Radiculopathy, lumbosacral region: Secondary | ICD-10-CM

## 2021-12-15 MED ORDER — NAPROXEN 500 MG PO TABS: 500.0000 mg | ORAL_TABLET | Freq: Two times a day (BID) | ORAL | 2 refills | Status: DC

## 2021-12-15 MED ORDER — HYDROCODONE-ACETAMINOPHEN 10-325 MG PO TABS: 0.5000 | ORAL_TABLET | ORAL | 0 refills | Status: DC | PRN

## 2021-12-15 NOTE — Progress Notes (Signed)
Office Visit Note   Patient: Ana Santos           Date of Birth: 11/02/1940           MRN: [id number] Visit Date: 12/15/2021              Requested by: Haydee Salter, MD [place],  [place] [id number] PCP: Haydee Salter, MD   Assessment & Plan: Visit Diagnoses:  1. Lumbar post-laminectomy syndrome   2. Post-procedural fever   3. Spondylosis without myelopathy or radiculopathy, cervical region   4. Lumbosacral radiculopathy at L2     Plan: Avoid frequent bending and stooping  No lifting greater than 10 lbs. May use ice or moist heat for pain. Weight loss is of benefit.  Exercise is important to improve your indurance and does allow people to function better inspite of back pain.    Follow-Up Instructions: No follow-ups on file.   Orders:  No orders of the defined types were placed in this encounter.  No orders of the defined types were placed in this encounter.     Procedures: No procedures performed   Clinical Data: No additional findings.   Subjective: Chief Complaint  Patient presents with   Lower Back - Follow-up    MRI Review    81 year old female with ongoing pain in the left upper lumbar and lower thoracic area with pain with lying still and with sitting or standing. She has not been to PT since the last surgery. She has had previous left L1-2 hemilaminectomy and more recently left L1-2 TLIF in 08/2021. She is having continuous left flank and upper lumbar pain. MRI was done this past Saturday 4 days ago and it is availble for review. Either standing or sitting is painful and first thing in the AM she can hardly get to moving.     Review of Systems   Objective: Vital Signs: BP (!) 151/65 (BP Location: Left Arm, Patient Position: Sitting)   Pulse 60   Ht 5' (1.524 m)   Wt 112 lb 8 oz (51 kg)   BMI 21.97 kg/m   Physical Exam  Ortho Exam  Specialty Comments:  No specialty comments available.  Imaging: No  results found.   PMFS History: Patient Active Problem List   Diagnosis Date Noted   Other secondary scoliosis, lumbar region 08/23/2021    Priority: High    Class: Chronic   Oxygen desaturation 08/24/2021   Fusion of spine of lumbar region 08/23/2021   Lumbar post-laminectomy syndrome 06/15/2021   Status post lumbar microdiscectomy 03/09/2021   Recurrent herniation of lumbar disc 02/28/2021   Foraminal stenosis due to intervertebral disc disease 01/21/2021   History of bladder cancer 01/21/2021   Labyrinthitis 12/08/2019   Left-sided low back pain without sciatica 05/05/2019   Visual loss, transient, bilateral 02/21/2019   Essential hypertension 02/21/2019   Colonic mass s/p lap ileocectomy 03/14/2018 03/14/2018   Acute lower GI bleeding 03/14/2018   Chronic anticoagulation 03/14/2018   Chronic tension-type headache, not intractable 05/31/2015   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 ([place]) 03/11/2014   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   RLS (restless legs syndrome) 07/04/2012   History of cerebral aneurysm 03/26/2009   Vitamin D deficiency 08/10/2008   Venous insufficiency 08/10/2008   History of acoustic neuroma 08/18/2007   Hypothyroidism 08/18/2007   Tobacco use disorder, continuous 08/18/2007   Mixed hyperlipidemia 07/18/2007   Anxiety with depression  07/18/2007   COPD (chronic obstructive pulmonary disease) with chronic bronchitis ([place]) 07/18/2007   Fibromyalgia 07/18/2007   Past Medical History:  Diagnosis Date   AN (acoustic neuroma) (HCC)    deafness in R ear   Aneurysm ([place])    basilar tip aneursym s/p stent assisted coiling 04/26/09   Anxiety    Bladder cancer (HCC)    Cataract    Cigarette smoker    COPD (chronic obstructive pulmonary disease) (HCC)    mild, no inhalers or oxygen used   Coronary artery disease    no current cardiologist  pt. denies   Depression    Headache    tension headaches due to aneurysm  repair with stent   Hyperlipidemia    Hypertension    Hypothyroidism    IBS (irritable bowel syndrome)    Mitral valve prolapse    mild takes beta blocker   Osteopenia    Stroke ([place])    x 3 ministrokes last one feb 2014   Venous insufficiency    Vitamin D deficiency     Family History  Problem Relation Age of Onset   Heart disease Mother    Cancer Mother        Lung   Heart attack Mother    Diabetes Mother    Cancer Father        Lung   Diabetes Sister    Cancer Sister        unkown type   Diabetes Brother    Lung cancer Brother        Lung   AAA (abdominal aortic aneurysm) Neg Hx    Colon cancer Neg Hx    Stomach cancer Neg Hx    Esophageal cancer Neg Hx    Liver cancer Neg Hx    Pancreatic cancer Neg Hx    Rectal cancer Neg Hx     Past Surgical History:  Procedure Laterality Date   ANEURYSM COILING  04/2009   Dr. Estanislado Pandy   APPENDECTOMY     BACK SURGERY  02/28/2021   BREAST LUMPECTOMY Right 1986   benign   DIAGNOSTIC LAPAROSCOPY     Proable laparoscopic right colectomy 03-14-18 Dr. Excell Seltzer   EYE SURGERY Bilateral    ioc lens for cataracts   LAPAROSCOPIC RIGHT COLECTOMY Right 03/14/2018   Procedure: LAPAROSCOPIC ASSISTED  RIGHT COLECTOMY;  Surgeon: Excell Seltzer, MD;  [facility];  Service: General;  Laterality: Right;   LAPAROSCOPY N/A 03/14/2018   Procedure: LAPAROSCOPY;  Surgeon: Excell Seltzer, MD;  [facility];  Service: General;  Laterality: N/A;   LEFT HEART CATHETERIZATION WITH CORONARY ANGIOGRAM N/A 08/05/2014   Procedure: LEFT HEART CATHETERIZATION WITH CORONARY ANGIOGRAM;  Surgeon: Wellington Hampshire, MD;  Location: [facility];  Service: Cardiovascular;  Laterality: N/A;   LUMBAR LAMINECTOMY N/A 02/28/2021   Procedure: LEFT L1-L2 MICRODISCECTOMY AND FASCIECTOMY;  Surgeon: Marybelle Killings, MD;  Location: [facility];  Service: Orthopedics;  Laterality: N/A;   TONSILLECTOMY     TOTAL ABDOMINAL HYSTERECTOMY  1970   complete    TRANSLABYRINTHINE PROCEDURE  2002   [facility] Dr. Vicie Mutters tumor removal   TRANSURETHRAL RESECTION OF BLADDER TUMOR N/A 02/08/2018   Procedure: TRANSURETHRAL RESECTION OF BLADDER TUMOR (TURBT)/ POSTOPERATIVE INSTILLATION OF CHEMO THERAPY;  Surgeon: Festus Aloe, MD;  Location: [facility];  Service: Urology;  Laterality: N/A;   Social History   Occupational History   Occupation: alterations   Occupation: Retired    Comment: Regulatory affairs officer  Tobacco  Use   Smoking status: Every Day    Packs/day: 0.25    Years: 50.00    Total pack years: 12.50    Types: Cigarettes   Smokeless tobacco: Never   Tobacco comments:    4-5 cigs daily  is aware she needs to quit  Vaping Use   Vaping Use: Never used  Substance and Sexual Activity   Alcohol use: No    Alcohol/week: 0.0 standard drinks of alcohol   Drug use: No   Sexual activity: Not Currently

## 2021-12-15 NOTE — Patient Instructions (Signed)
Plan: Avoid frequent bending and stooping  No lifting greater than 10 lbs. May use ice or moist heat for pain. Weight loss is of ben

## 2021-12-16 ENCOUNTER — Telehealth: Payer: Self-pay | Admitting: Family Medicine

## 2021-12-16 NOTE — Telephone Encounter (Signed)
Left message for patient to call back and schedule Medicare Annual Wellness Visit (AWV).   Please offer to do virtually or by telephone.  Left office number and my jabber [phone].  Last AWV:01/29/2020  Please schedule at anytime with Nurse Health Advisor.

## 2021-12-29 ENCOUNTER — Other Ambulatory Visit: Payer: Self-pay | Admitting: Specialist

## 2021-12-29 NOTE — Telephone Encounter (Signed)
Mrs. Bartl is calling for a prescription refill of Hydrocodone please advise

## 2022-01-02 ENCOUNTER — Telehealth: Payer: Self-pay | Admitting: Specialist

## 2022-01-02 ENCOUNTER — Other Ambulatory Visit: Payer: Self-pay | Admitting: Specialist

## 2022-01-02 MED ORDER — HYDROCODONE-ACETAMINOPHEN 10-325 MG PO TABS: 0.5000 | ORAL_TABLET | ORAL | 0 refills | Status: DC | PRN

## 2022-01-02 NOTE — Telephone Encounter (Signed)
She a pending refill in Dr. Otho Ket box

## 2022-01-02 NOTE — Telephone Encounter (Signed)
Pt states she is in a lot of pain and she is "turning 35 tomorrow and has family coming in so she needs her medication."  She states the Naprosyn makes her sick CB [phone]

## 2022-01-09 ENCOUNTER — Telehealth: Payer: Self-pay | Admitting: Family Medicine

## 2022-01-09 NOTE — Telephone Encounter (Signed)
Left message for patient to call back and schedule Medicare Annual Wellness Visit (AWV).   Please offer to do virtually or by telephone.  Left office number and my jabber [phone].  Last AWV:01/29/2020  Please schedule at anytime with Nurse Health Advisor.

## 2022-01-11 ENCOUNTER — Ambulatory Visit (INDEPENDENT_AMBULATORY_CARE_PROVIDER_SITE_OTHER): Payer: Medicare Other | Admitting: Physical Medicine and Rehabilitation

## 2022-01-11 ENCOUNTER — Encounter: Payer: Self-pay | Admitting: Physical Medicine and Rehabilitation

## 2022-01-11 ENCOUNTER — Ambulatory Visit: Payer: Self-pay

## 2022-01-11 VITALS — BP 138/66 | HR 66 | Ht 60.0 in | Wt 112.5 lb

## 2022-01-11 DIAGNOSIS — M5416 Radiculopathy, lumbar region: Secondary | ICD-10-CM

## 2022-01-11 DIAGNOSIS — M961 Postlaminectomy syndrome, not elsewhere classified: Secondary | ICD-10-CM

## 2022-01-11 MED ORDER — DEXAMETHASONE SODIUM PHOSPHATE 10 MG/ML IJ SOLN
15.0000 mg | Freq: Once | INTRAMUSCULAR | Status: AC
  Administered 2022-01-11: 15 mg

## 2022-01-11 NOTE — Progress Notes (Signed)
Numeric Pain Rating Scale and Functional Assessment Average Pain 5  Left sided mid back pain. In the last MONTH (on 0-10 scale) has pain interfered with the following?  1. General activity like being  able to carry out your everyday physical activities such as walking, climbing stairs, carrying groceries, or moving a chair?  Rating(6)   +Driver, -BT, -Dye Allergies.

## 2022-01-12 ENCOUNTER — Ambulatory Visit (INDEPENDENT_AMBULATORY_CARE_PROVIDER_SITE_OTHER): Payer: Medicare Other | Admitting: Specialist

## 2022-01-12 VITALS — BP 146/73 | HR 92 | Ht 60.0 in | Wt 112.5 lb

## 2022-01-12 DIAGNOSIS — M961 Postlaminectomy syndrome, not elsewhere classified: Secondary | ICD-10-CM

## 2022-01-12 DIAGNOSIS — M25552 Pain in left hip: Secondary | ICD-10-CM

## 2022-01-12 DIAGNOSIS — Z981 Arthrodesis status: Secondary | ICD-10-CM

## 2022-01-15 ENCOUNTER — Other Ambulatory Visit: Payer: Self-pay | Admitting: Family Medicine

## 2022-01-22 NOTE — Progress Notes (Signed)
Ana Santos - 81 y.o. female MRN [id number]  Date of birth: [birth year]  Office Visit Note: Visit Date: 01/11/2022 PCP: Haydee Salter, MD Referred by: Haydee Salter, MD  Subjective: Chief Complaint  Patient presents with   Middle Back - Pain   HPI:  Ana Santos is a 81 y.o. female who comes in today at the request of Dr. [name] for planned Left L1-2 Lumbar Transforaminal epidural steroid injection with fluoroscopic guidance.  The patient has failed conservative care including home exercise, medications, time and activity modification.  This injection will be diagnostic and hopefully therapeutic.  Please see requesting physician notes for further details and justification.   ROS Otherwise per HPI.  Assessment & Plan: Visit Diagnoses:    ICD-10-CM   1. Lumbar radiculopathy  M54.16 XR C-ARM NO REPORT    Epidural Steroid injection    dexamethasone (DECADRON) injection 15 mg    2. Post laminectomy syndrome  M96.1       Plan: No additional findings.   Meds & Orders:  Meds ordered this encounter  Medications   dexamethasone (DECADRON) injection 15 mg    Orders Placed This Encounter  Procedures   XR C-ARM NO REPORT   Epidural Steroid injection    Follow-up: Return for visit to requesting provider as needed.   Procedures: No procedures performed  Lumbosacral Transforaminal Epidural Steroid Injection - Sub-Pedicular Approach with Fluoroscopic Guidance  Patient: Ana Santos      Date of Birth: 01-21-1941 MRN: [id number] PCP: Haydee Salter, MD      Visit Date: 01/11/2022   Universal Protocol:    Date/Time: 01/11/2022  Consent Given By: the patient  Position: PRONE  Additional Comments: Vital signs were monitored before and after the procedure. Patient was prepped and draped in the usual sterile fashion. The correct patient, procedure, and site was verified.   Injection Procedure Details:   Procedure diagnoses: Lumbar radiculopathy  [M54.16]    Meds Administered:  Meds ordered this encounter  Medications   dexamethasone (DECADRON) injection 15 mg    Laterality: Left  Location/Site: L1  Needle:3.5 in., 22 ga.  Short bevel or Quincke spinal needle  Needle Placement: Transforaminal  Findings:    -Comments: Excellent flow of contrast along the nerve, nerve root and into the epidural space.  Procedure Details: After squaring off the end-plates to get a true AP view, the C-arm was positioned so that an oblique view of the foramen as noted above was visualized. The target area is just inferior to the "nose of the scotty dog" or sub pedicular. The soft tissues overlying this structure were infiltrated with 2-3 ml. of 1% Lidocaine without Epinephrine.  The spinal needle was inserted toward the target using a "trajectory" view along the fluoroscope beam.  Under AP and lateral visualization, the needle was advanced so it did not puncture dura and was located close the 6 O'Clock position of the pedical in AP tracterory. Biplanar projections were used to confirm position. Aspiration was confirmed to be negative for CSF and/or blood. A 1-2 ml. volume of Isovue-250 was injected and flow of contrast was noted at each level. Radiographs were obtained for documentation purposes.   After attaining the desired flow of contrast documented above, a 0.5 to 1.0 ml test dose of 0.25% Marcaine was injected into each respective transforaminal space.  The patient was observed for 90 seconds post injection.  After no sensory deficits were reported, and normal lower extremity motor function  was noted,   the above injectate was administered so that equal amounts of the injectate were placed at each foramen (level) into the transforaminal epidural space.   Additional Comments:  No complications occurred Dressing: 2 x 2 sterile gauze and Band-Aid    Post-procedure details: Patient was observed during the procedure. Post-procedure instructions  were reviewed.  Patient left the clinic in stable condition.    Clinical History: No specialty comments available.     Objective:  VS:  HT:5' (152.4 cm)   WT:112 lb 8 oz (51 kg)  BMI:21.97    BP:138/66  HR:66bpm  TEMP: ( )  RESP:  Physical Exam Vitals and nursing note reviewed.  Constitutional:      General: She is not in acute distress.    Appearance: Normal appearance. She is not ill-appearing.  HENT:     Head: Normocephalic and atraumatic.     Right Ear: External ear normal.     Left Ear: External ear normal.  Eyes:     Extraocular Movements: Extraocular movements intact.  Cardiovascular:     Rate and Rhythm: Normal rate.     Pulses: Normal pulses.  Pulmonary:     Effort: Pulmonary effort is normal. No respiratory distress.  Abdominal:     General: There is no distension.     Palpations: Abdomen is soft.  Musculoskeletal:        General: Tenderness present.     Cervical back: Neck supple.     Right lower leg: No edema.     Left lower leg: No edema.     Comments: Patient has good distal strength with no pain over the greater trochanters.  No clonus or focal weakness.  Skin:    Findings: No erythema, lesion or rash.  Neurological:     General: No focal deficit present.     Mental Status: She is alert and oriented to person, place, and time.     Sensory: No sensory deficit.     Motor: No weakness or abnormal muscle tone.     Coordination: Coordination normal.  Psychiatric:        Mood and Affect: Mood normal.        Behavior: Behavior normal.      Imaging: No results found.

## 2022-01-22 NOTE — Procedures (Signed)
Lumbosacral Transforaminal Epidural Steroid Injection - Sub-Pedicular Approach with Fluoroscopic Guidance  Patient: Ana Santos      Date of Birth: 1940/11/07 MRN: [id number] PCP: Haydee Salter, MD      Visit Date: 01/11/2022   Universal Protocol:    Date/Time: 01/11/2022  Consent Given By: the patient  Position: PRONE  Additional Comments: Vital signs were monitored before and after the procedure. Patient was prepped and draped in the usual sterile fashion. The correct patient, procedure, and site was verified.   Injection Procedure Details:   Procedure diagnoses: Lumbar radiculopathy [M54.16]    Meds Administered:  Meds ordered this encounter  Medications   dexamethasone (DECADRON) injection 15 mg    Laterality: Left  Location/Site: L1  Needle:3.5 in., 22 ga.  Short bevel or Quincke spinal needle  Needle Placement: Transforaminal  Findings:    -Comments: Excellent flow of contrast along the nerve, nerve root and into the epidural space.  Procedure Details: After squaring off the end-plates to get a true AP view, the C-arm was positioned so that an oblique view of the foramen as noted above was visualized. The target area is just inferior to the "nose of the scotty dog" or sub pedicular. The soft tissues overlying this structure were infiltrated with 2-3 ml. of 1% Lidocaine without Epinephrine.  The spinal needle was inserted toward the target using a "trajectory" view along the fluoroscope beam.  Under AP and lateral visualization, the needle was advanced so it did not puncture dura and was located close the 6 O'Clock position of the pedical in AP tracterory. Biplanar projections were used to confirm position. Aspiration was confirmed to be negative for CSF and/or blood. A 1-2 ml. volume of Isovue-250 was injected and flow of contrast was noted at each level. Radiographs were obtained for documentation purposes.   After attaining the desired flow of contrast  documented above, a 0.5 to 1.0 ml test dose of 0.25% Marcaine was injected into each respective transforaminal space.  The patient was observed for 90 seconds post injection.  After no sensory deficits were reported, and normal lower extremity motor function was noted,   the above injectate was administered so that equal amounts of the injectate were placed at each foramen (level) into the transforaminal epidural space.   Additional Comments:  No complications occurred Dressing: 2 x 2 sterile gauze and Band-Aid    Post-procedure details: Patient was observed during the procedure. Post-procedure instructions were reviewed.  Patient left the clinic in stable condition.

## 2022-01-23 ENCOUNTER — Telehealth: Payer: Self-pay | Admitting: Family Medicine

## 2022-01-23 DIAGNOSIS — G2581 Restless legs syndrome: Secondary | ICD-10-CM

## 2022-01-23 NOTE — Telephone Encounter (Signed)
Caller Name: Tarah Buboltz Call back phone #: [phone]  MEDICATION(S): Plavix, Mirapex   Has the patient contacted their pharmacy (YES/NO)? yes What did pharmacy advise? For PCP to send over prescription  Preferred Pharmacy: [facility], [place] Phone:  [phone]  Fax:  [phone]   ~~~Please advise patient/caregiver to allow 2-3 business days to process RX refills.

## 2022-01-23 NOTE — Telephone Encounter (Signed)
Pt called to update message. She has received the Plavix, however still needs the mirapex

## 2022-01-25 ENCOUNTER — Telehealth: Payer: Self-pay | Admitting: Specialist

## 2022-01-25 ENCOUNTER — Other Ambulatory Visit: Payer: Self-pay | Admitting: Surgery

## 2022-01-25 MED ORDER — PRAMIPEXOLE DIHYDROCHLORIDE 0.5 MG PO TABS: ORAL_TABLET | ORAL | 4 refills | Status: DC

## 2022-01-25 MED ORDER — TRAMADOL HCL 50 MG PO TABS: 50.0000 mg | ORAL_TABLET | Freq: Two times a day (BID) | ORAL | 0 refills | Status: DC | PRN

## 2022-01-25 NOTE — Telephone Encounter (Signed)
Pt called requesting a refill of hydrocodone. Please send to pharmacy on file. Pt phone number is [phone].

## 2022-01-25 NOTE — Telephone Encounter (Signed)
Caller Name: Breanna Shorkey Call back phone #: [phone]  Reason for Call: [facility], however if she needs to pick this up with urgency she is okay with using [facility], [place]

## 2022-01-25 NOTE — Telephone Encounter (Signed)
Spoke with patient and verified dob. Patient confirmed [facility] for pharmacy and that she has enough of her mirapex to last her until its delivered.   Chart supports rx. Last OV: 12/01/2021 Next OV: 03/03/2022

## 2022-01-31 ENCOUNTER — Telehealth: Payer: Self-pay | Admitting: Family Medicine

## 2022-01-31 NOTE — Telephone Encounter (Signed)
Left message for patient to call back and schedule Medicare Annual Wellness Visit (AWV).   Please offer to do virtually or by telephone.  Left office number and my jabber [phone].  Last AWV:01/29/2020  Please schedule at anytime with Nurse Health Advisor.

## 2022-02-08 ENCOUNTER — Encounter: Payer: Self-pay | Admitting: Specialist

## 2022-02-08 ENCOUNTER — Ambulatory Visit (INDEPENDENT_AMBULATORY_CARE_PROVIDER_SITE_OTHER): Payer: Medicare Other | Admitting: Specialist

## 2022-02-08 VITALS — BP 154/68 | HR 61 | Ht 60.0 in | Wt 113.0 lb

## 2022-02-08 DIAGNOSIS — M5416 Radiculopathy, lumbar region: Secondary | ICD-10-CM

## 2022-02-08 DIAGNOSIS — M961 Postlaminectomy syndrome, not elsewhere classified: Secondary | ICD-10-CM

## 2022-02-08 DIAGNOSIS — M792 Neuralgia and neuritis, unspecified: Secondary | ICD-10-CM | POA: Diagnosis not present

## 2022-02-08 MED ORDER — HYDROCODONE-ACETAMINOPHEN 10-325 MG PO TABS: 0.5000 | ORAL_TABLET | Freq: Four times a day (QID) | ORAL | 0 refills | Status: DC | PRN

## 2022-02-08 NOTE — Patient Instructions (Addendum)
Avoid bending, stooping and avoid lifting weights greater than 10 lbs. Avoid prolong standing and walking. Avoid frequent bending and stooping  No lifting greater than 10 lbs. May use ice or moist heat for pain. Weight loss is of benefit. Handicap license is approved. Dr. Romona Curls secretary/Assistant will call to arrange for epidural steroid injection  will arrange for two more two weeks apart as the first injection seemed to be of help but only temporary.  Sock applicator and a long shoe horn and grabber on a handle.

## 2022-02-08 NOTE — Progress Notes (Signed)
Office Visit Note   Patient: Ana Santos           Date of Birth: 15-Dec-1940           MRN: [id number] Visit Date: 02/08/2022              Requested by: Haydee Salter, MD [place],  [place] [address] PCP: Haydee Salter, MD   Assessment & Plan: Visit Diagnoses:  1. Lumbar radiculopathy   2. Post laminectomy syndrome   3. Neurogenic pain     Plan: Avoid bending, stooping and avoid lifting weights greater than 10 lbs. Avoid prolong standing and walking. Avoid frequent bending and stooping  No lifting greater than 10 lbs. May use ice or moist heat for pain. Weight loss is of benefit. Handicap license is approved. Dr. Romona Curls secretary/Assistant will call to arrange for epidural steroid injection    Follow-Up Instructions: Return in about 4 weeks (around 03/08/2022).   Orders:  No orders of the defined types were placed in this encounter.  No orders of the defined types were placed in this encounter.     Procedures: No procedures performed   Clinical Data: No additional findings.   Subjective: Chief Complaint  Patient presents with   Lower Back - Follow-up    HPI  Review of Systems  Constitutional: Negative.   HENT: Negative.    Eyes: Negative.   Respiratory: Negative.    Cardiovascular: Negative.   Gastrointestinal: Negative.   Endocrine: Negative.   Genitourinary: Negative.   Musculoskeletal: Negative.   Skin: Negative.   Allergic/Immunologic: Negative.   Neurological: Negative.   Hematological: Negative.   Psychiatric/Behavioral: Negative.       Objective: Vital Signs: BP (!) 154/68 (BP Location: Left Arm, Patient Position: Sitting)   Pulse 61   Ht 5' (1.524 m)   Wt 113 lb (51.3 kg)   BMI 22.07 kg/m   Physical Exam Constitutional:      Appearance: She is well-developed.  HENT:     Head: Normocephalic and atraumatic.  Eyes:     Pupils: Pupils are equal, round, and reactive to light.  Pulmonary:      Effort: Pulmonary effort is normal.     Breath sounds: Normal breath sounds.  Abdominal:     General: Bowel sounds are normal.     Palpations: Abdomen is soft.  Musculoskeletal:     Cervical back: Normal range of motion and neck supple.     Lumbar back: Negative right straight leg raise test and negative left straight leg raise test.  Skin:    General: Skin is warm and dry.  Neurological:     Mental Status: She is alert and oriented to person, place, and time.  Psychiatric:        Behavior: Behavior normal.        Thought Content: Thought content normal.        Judgment: Judgment normal.     Back Exam   Tenderness  The patient is experiencing tenderness in the lumbar.  Range of Motion  Extension:  abnormal  Flexion:  abnormal  Lateral bend right:  abnormal  Lateral bend left:  abnormal  Rotation right:  abnormal  Rotation left:  abnormal   Muscle Strength  Right Quadriceps:  5/5  Left Quadriceps:  5/5  Right Hamstrings:  5/5  Left Hamstrings:  5/5   Tests  Straight leg raise right: negative Straight leg raise left: negative  Other  Toe walk:  normal Heel walk: normal      Specialty Comments:  No specialty comments available.  Imaging: No results found.   PMFS History: Patient Active Problem List   Diagnosis Date Noted   Other secondary scoliosis, lumbar region 08/23/2021    Priority: High    Class: Chronic   Oxygen desaturation 08/24/2021   Fusion of spine of lumbar region 08/23/2021   Lumbar post-laminectomy syndrome 06/15/2021   Status post lumbar microdiscectomy 03/09/2021   Recurrent herniation of lumbar disc 02/28/2021   Foraminal stenosis due to intervertebral disc disease 01/21/2021   History of bladder cancer 01/21/2021   Labyrinthitis 12/08/2019   Left-sided low back pain without sciatica 05/05/2019   Visual loss, transient, bilateral 02/21/2019   Essential hypertension 02/21/2019   Colonic mass s/p lap ileocectomy 03/14/2018 03/14/2018    Acute lower GI bleeding 03/14/2018   Chronic anticoagulation 03/14/2018   Chronic tension-type headache, not intractable 05/31/2015   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 ([place]) 03/11/2014   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   RLS (restless legs syndrome) 07/04/2012   History of cerebral aneurysm 03/26/2009   Vitamin D deficiency 08/10/2008   Venous insufficiency 08/10/2008   History of acoustic neuroma 08/18/2007   Hypothyroidism 08/18/2007   Tobacco use disorder, continuous 08/18/2007   Mixed hyperlipidemia 07/18/2007   Anxiety with depression 07/18/2007   COPD (chronic obstructive pulmonary disease) with chronic bronchitis ([place]) 07/18/2007   Fibromyalgia 07/18/2007   Past Medical History:  Diagnosis Date   AN (acoustic neuroma) ([place])    deafness in R ear   Aneurysm ([place])    basilar tip aneursym s/p stent assisted coiling 04/26/09   Anxiety    Bladder cancer ([place])    Cataract    Cigarette smoker    COPD (chronic obstructive pulmonary disease) (HCC)    mild, no inhalers or oxygen used   Coronary artery disease    no current cardiologist  pt. denies   Depression    Headache    tension headaches due to aneurysm repair with stent   Hyperlipidemia    Hypertension    Hypothyroidism    IBS (irritable bowel syndrome)    Mitral valve prolapse    mild takes beta blocker   Osteopenia    Stroke ([place])    x 3 ministrokes last one feb 2014   Venous insufficiency    Vitamin D deficiency     Family History  Problem Relation Age of Onset   Heart disease Mother    Cancer Mother        Lung   Heart attack Mother    Diabetes Mother    Cancer Father        Lung   Diabetes Sister    Cancer Sister        unkown type   Diabetes Brother    Lung cancer Brother        Lung   AAA (abdominal aortic aneurysm) Neg Hx    Colon cancer Neg Hx    Stomach cancer Neg Hx    Esophageal cancer Neg Hx    Liver cancer Neg Hx    Pancreatic cancer  Neg Hx    Rectal cancer Neg Hx     Past Surgical History:  Procedure Laterality Date   ANEURYSM COILING  04/2009   Dr. Estanislado Pandy   APPENDECTOMY     BACK SURGERY  02/28/2021   BREAST LUMPECTOMY Right 1986   benign   DIAGNOSTIC LAPAROSCOPY  Proable laparoscopic right colectomy 03-14-18 Dr. Excell Seltzer   EYE SURGERY Bilateral    ioc lens for cataracts   LAPAROSCOPIC RIGHT COLECTOMY Right 03/14/2018   Procedure: LAPAROSCOPIC ASSISTED  RIGHT COLECTOMY;  Surgeon: Excell Seltzer, MD;  Location: [facility];  Service: General;  Laterality: Right;   LAPAROSCOPY N/A 03/14/2018   Procedure: LAPAROSCOPY;  Surgeon: Excell Seltzer, MD;  Location: [facility];  Service: General;  Laterality: N/A;   LEFT HEART CATHETERIZATION WITH CORONARY ANGIOGRAM N/A 08/05/2014   Procedure: LEFT HEART CATHETERIZATION WITH CORONARY ANGIOGRAM;  Surgeon: Wellington Hampshire, MD;  Location: [facility];  Service: Cardiovascular;  Laterality: N/A;   LUMBAR LAMINECTOMY N/A 02/28/2021   Procedure: LEFT L1-L2 MICRODISCECTOMY AND FASCIECTOMY;  Surgeon: Marybelle Killings, MD;  Location: [facility];  Service: Orthopedics;  Laterality: N/A;   TONSILLECTOMY     TOTAL ABDOMINAL HYSTERECTOMY  1970   complete   TRANSLABYRINTHINE PROCEDURE  2002   [facility] Dr. Vicie Mutters tumor removal   TRANSURETHRAL RESECTION OF BLADDER TUMOR N/A 02/08/2018   Procedure: TRANSURETHRAL RESECTION OF BLADDER TUMOR (TURBT)/ POSTOPERATIVE INSTILLATION OF CHEMO THERAPY;  Surgeon: Festus Aloe, MD;  Location: [facility];  Service: Urology;  Laterality: N/A;   Social History   Occupational History   Occupation: alterations   Occupation: Retired    Comment: Regulatory affairs officer  Tobacco Use   Smoking status: Every Day    Packs/day: 0.25    Years: 50.00    Total pack years: 12.50    Types: Cigarettes   Smokeless tobacco: Never   Tobacco comments:    4-5 cigs daily  is aware she needs to quit  Vaping Use   Vaping Use: Never used  Substance and Sexual  Activity   Alcohol use: No    Alcohol/week: 0.0 standard drinks of alcohol   Drug use: No   Sexual activity: Not Currently

## 2022-02-14 ENCOUNTER — Telehealth: Payer: Self-pay

## 2022-02-14 NOTE — Patient Outreach (Signed)
  Care Coordination   Initial Visit Note   02/14/2022 Name: KASHMERE DAYWALT MRN: [id number] DOB: 05-06-1941  LADORA OSTERBERG is a 81 y.o. year old female who sees Rudd, Lillette Boxer, MD for primary care. I spoke with  Hamilton Capri by phone today.  What matters to the patients health and wellness today?  Keep back pain under control.    Goals Addressed             This Visit's Progress    COMPLETED: Keep Back pain controlled       Care Coordination Interventions: Evaluation of current treatment plan related to back pain and patient's adherence to plan as established by provider Advised patient to provide appropriate vaccination information to provider or CM team member at next visit           SDOH assessments and interventions completed:  Yes     Care Coordination Interventions Activated:  Yes  Care Coordination Interventions:  Yes, provided   Follow up plan: No further intervention required.   Encounter Outcome:  Pt. Visit Completed   Jone Baseman, RN, MSN [facility] Care Management Coordinator Direct Line [phone] Toll Free: [phone]  Fax: [phone]

## 2022-02-14 NOTE — Patient Instructions (Signed)
Visit Information  Thank you for taking time to visit with me today. Please don't hesitate to contact me if I can be of assistance to you.   Following are the goals we discussed today:   Goals Addressed             This Visit's Progress    COMPLETED: Keep Back pain controlled       Care Coordination Interventions: Evaluation of current treatment plan related to back pain and patient's adherence to plan as established by provider Advised patient to provide appropriate vaccination information to provider or CM team member at next visit             Please call the care guide team at [phone] if you need to cancel or reschedule your appointment.   If you are experiencing a Mental Health or [place] or need someone to talk to, please call the Suicide and Crisis Lifeline: 988   The patient verbalized understanding of instructions, educational materials, and care plan provided today and DECLINED offer to receive copy of patient instructions, educational materials, and care plan.   Patient declined further follow up.    Jone Baseman, RN, MSN [facility] Care Management Care Management Coordinator Direct Line [phone] Toll Free: [phone]  Fax: [phone]

## 2022-02-23 ENCOUNTER — Ambulatory Visit (INDEPENDENT_AMBULATORY_CARE_PROVIDER_SITE_OTHER): Payer: Medicare Other | Admitting: Physical Medicine and Rehabilitation

## 2022-02-23 ENCOUNTER — Ambulatory Visit: Payer: Self-pay

## 2022-02-23 DIAGNOSIS — M5416 Radiculopathy, lumbar region: Secondary | ICD-10-CM | POA: Diagnosis not present

## 2022-02-23 MED ORDER — DEXAMETHASONE SODIUM PHOSPHATE 10 MG/ML IJ SOLN
10.0000 mg | Freq: Once | INTRAMUSCULAR | Status: AC
  Administered 2022-02-23: 10 mg

## 2022-02-23 NOTE — Progress Notes (Signed)
Pain level today is an 8  The previous injection helped for about one day she felt good and she started hurting again she stated she is so tried of taking pills.   Bp 128/46  O2 96 Pr 73   She states that pain just moves up her back

## 2022-02-23 NOTE — Patient Instructions (Signed)

## 2022-03-02 ENCOUNTER — Other Ambulatory Visit: Payer: Self-pay | Admitting: Specialist

## 2022-03-02 MED ORDER — HYDROCODONE-ACETAMINOPHEN 10-325 MG PO TABS: 0.5000 | ORAL_TABLET | Freq: Four times a day (QID) | ORAL | 0 refills | Status: DC | PRN

## 2022-03-02 NOTE — Telephone Encounter (Signed)
Patient is in a lot of pain and would to know if she can get a refill on her hydrocodone sent into [facility]. CB # [phone]

## 2022-03-03 ENCOUNTER — Encounter: Payer: Self-pay | Admitting: Family Medicine

## 2022-03-03 ENCOUNTER — Ambulatory Visit (INDEPENDENT_AMBULATORY_CARE_PROVIDER_SITE_OTHER): Payer: Medicare Other | Admitting: Family Medicine

## 2022-03-03 VITALS — BP 136/78 | HR 59 | Temp 97.4°F | Ht 60.0 in | Wt 112.4 lb

## 2022-03-03 DIAGNOSIS — I10 Essential (primary) hypertension: Secondary | ICD-10-CM | POA: Diagnosis not present

## 2022-03-03 DIAGNOSIS — E782 Mixed hyperlipidemia: Secondary | ICD-10-CM

## 2022-03-03 DIAGNOSIS — Z23 Encounter for immunization: Secondary | ICD-10-CM

## 2022-03-03 DIAGNOSIS — M961 Postlaminectomy syndrome, not elsewhere classified: Secondary | ICD-10-CM | POA: Diagnosis not present

## 2022-03-03 DIAGNOSIS — E039 Hypothyroidism, unspecified: Secondary | ICD-10-CM

## 2022-03-03 LAB — TSH: TSH: 0.69 u[IU]/mL (ref 0.35–5.50)

## 2022-03-03 NOTE — Progress Notes (Signed)
[facility] [address] [place] [address] Dept: [phone] Dept Fax: [phone]  Chronic Care Office Visit  Subjective:    Patient ID: Ana Santos, female    DOB: 01/15/1941, 81 y.o..   MRN: [id number]  Chief Complaint  Patient presents with   Follow-up    3 month f/u.     History of Present Illness:  Patient is in today for reassessment of chronic medical issues.  [name] has a history of hypertension, managed with amlodipine 5 mg daily. She also has hyperlipidemia, which is managed with pravastatin 40 mg daily.   [name] has a history of hypothyroidism. She is managed on levothyroxine 75 mcg daily. Her last TSH was remained low after a dosage adjustment, so we stepped her dose down again.     [name] has a history of a fall in Sept. 2021. An MRI scan in may showed multi-level degenerative lumbar disease with foraminal stenoses. She had multiple ESIs without improvement. She had a left L1-L2 microdiscectomy on 02/28/2021. She had a lumbar fusion on 08/23/2021 due to recurrent disc herniation s/p microdiscectomy. She has now had further ESIs. She notes the last injection 10 days ago helped for 2 days, but then she was back in pain. She is currently using Vicodin for pain management. She denies any constipation. She feels this is getting her mood down. At her last visit, we increased her sertraline dose and she thinks this has helped a little.  Past Medical History: Patient Active Problem List   Diagnosis Date Noted   Oxygen desaturation 08/24/2021   Other secondary scoliosis, lumbar region 08/23/2021    Class: Chronic   Fusion of spine of lumbar region 08/23/2021   Lumbar post-laminectomy syndrome 06/15/2021   Status post lumbar microdiscectomy 03/09/2021   Recurrent herniation of lumbar disc 02/28/2021   Foraminal stenosis due to intervertebral disc disease 01/21/2021   History of bladder cancer  01/21/2021   Labyrinthitis 12/08/2019   Left-sided low back pain without sciatica 05/05/2019   Visual loss, transient, bilateral 02/21/2019   Essential hypertension 02/21/2019   Colonic mass s/p lap ileocectomy 03/14/2018 03/14/2018   Acute lower GI bleeding 03/14/2018   Chronic anticoagulation 03/14/2018   Chronic tension-type headache, not intractable 05/31/2015   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 ([place]) 03/11/2014   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   RLS (restless legs syndrome) 07/04/2012   History of cerebral aneurysm 03/26/2009   Vitamin D deficiency 08/10/2008   Venous insufficiency 08/10/2008   History of acoustic neuroma 08/18/2007   Hypothyroidism 08/18/2007   Tobacco use disorder, continuous 08/18/2007   Mixed hyperlipidemia 07/18/2007   Anxiety with depression 07/18/2007   COPD (chronic obstructive pulmonary disease) with chronic bronchitis ([place]) 07/18/2007   Fibromyalgia 07/18/2007   Past Surgical History:  Procedure Laterality Date   ANEURYSM COILING  04/2009   Dr. Estanislado Pandy   APPENDECTOMY     BACK SURGERY  02/28/2021   BREAST LUMPECTOMY Right 1986   benign   DIAGNOSTIC LAPAROSCOPY     Proable laparoscopic right colectomy 03-14-18 Dr. Excell Seltzer   EYE SURGERY Bilateral    ioc lens for cataracts   LAPAROSCOPIC RIGHT COLECTOMY Right 03/14/2018   Procedure: LAPAROSCOPIC ASSISTED  RIGHT COLECTOMY;  Surgeon: Excell Seltzer, MD;  Location: [facility];  Service: General;  Laterality: Right;   LAPAROSCOPY N/A 03/14/2018   Procedure: LAPAROSCOPY;  Surgeon: Excell Seltzer, MD;  Location: [facility];  Service: General;  Laterality: N/A;   LEFT HEART CATHETERIZATION WITH CORONARY ANGIOGRAM N/A 08/05/2014   Procedure: LEFT HEART CATHETERIZATION WITH CORONARY ANGIOGRAM;  Surgeon: Wellington Hampshire, MD;  Location: [facility];  Service: Cardiovascular;  Laterality: N/A;   LUMBAR LAMINECTOMY N/A 02/28/2021   Procedure: LEFT L1-L2  MICRODISCECTOMY AND FASCIECTOMY;  Surgeon: Marybelle Killings, MD;  Location: [facility];  Service: Orthopedics;  Laterality: N/A;   TONSILLECTOMY     TOTAL ABDOMINAL HYSTERECTOMY  1970   complete   TRANSLABYRINTHINE PROCEDURE  2002   [facility] Dr. Vicie Mutters tumor removal   TRANSURETHRAL RESECTION OF BLADDER TUMOR N/A 02/08/2018   Procedure: TRANSURETHRAL RESECTION OF BLADDER TUMOR (TURBT)/ POSTOPERATIVE INSTILLATION OF CHEMO THERAPY;  Surgeon: Festus Aloe, MD;  Location: [facility];  Service: Urology;  Laterality: N/A;   Family History  Problem Relation Age of Onset   Heart disease Mother    Cancer Mother        Lung   Heart attack Mother    Diabetes Mother    Cancer Father        Lung   Diabetes Sister    Cancer Sister        unkown type   Diabetes Brother    Lung cancer Brother        Lung   AAA (abdominal aortic aneurysm) Neg Hx    Colon cancer Neg Hx    Stomach cancer Neg Hx    Esophageal cancer Neg Hx    Liver cancer Neg Hx    Pancreatic cancer Neg Hx    Rectal cancer Neg Hx    Outpatient Medications Prior to Visit  Medication Sig Dispense Refill   acetaminophen (TYLENOL) 500 MG tablet Take 500 mg by mouth every 6 (six) hours as needed for mild pain.     amLODipine (NORVASC) 5 MG tablet TAKE 1 TABLET DAILY 90 tablet 3   Cholecalciferol (VITAMIN D-3) 125 MCG (5000 UT) TABS Take 5,000 Units by mouth daily.     clopidogrel (PLAVIX) 75 MG tablet TAKE 1 TABLET DAILY 90 tablet 3   HYDROcodone-acetaminophen (NORCO) 10-325 MG tablet Take 0.5 tablets by mouth every 6 (six) hours as needed. 15 tablet 0   hydroxypropyl methylcellulose / hypromellose (ISOPTO TEARS / GONIOVISC) 2.5 % ophthalmic solution Place 1 drop into both eyes at bedtime.     levothyroxine (SYNTHROID) 75 MCG tablet Take 1 tablet (75 mcg total) by mouth daily. 90 tablet 3   pramipexole (MIRAPEX) 0.5 MG tablet Take one nightly one hour prior to going to bed as needed for restless leg syndrome. 90 tablet 4    pravastatin (PRAVACHOL) 40 MG tablet TAKE 1 TABLET DAILY 90 tablet 3   sertraline (ZOLOFT) 50 MG tablet Take 1.5 tablets (75 mg total) by mouth daily. 135 tablet 3   docusate sodium (COLACE) 100 MG capsule Take 1 capsule (100 mg total) by mouth 2 (two) times daily. 10 capsule 0   guaiFENesin (MUCINEX) 600 MG 12 hr tablet Take 1 tablet (600 mg total) by mouth 2 (two) times daily. 60 tablet 1   naproxen (NAPROSYN) 500 MG tablet Take 1 tablet (500 mg total) by mouth 2 (two) times daily with a meal. 60 tablet 2   ondansetron (ZOFRAN) 4 MG tablet Take 1 tablet (4 mg total) by mouth every 8 (eight) hours as needed for nausea or vomiting. 20 tablet 0   Facility-Administered Medications Prior to Visit  Medication Dose Route Frequency Provider Last Rate Last Admin   dexamethasone (  DECADRON) injection 10 mg  10 mg Other Once Magnus Sinning, MD       Allergies  Allergen Reactions   Prednisone Other (See Comments)    Hallucinations   Atorvastatin Other (See Comments)     Lipitor caused arm pain (3/09)   Hydrocodone-Acetaminophen Other (See Comments)    hallucinations   Morphine Other (See Comments)    hallucinations   Nortriptyline Nausea And Vomiting    Caused nausea and vomiting and shaking, kept her up all night   Topamax [Topiramate] Nausea And Vomiting    *dizziness*   Tramadol Other (See Comments)    withdrawal     Objective:   Today's Vitals   03/03/22 0810  BP: 136/78  Pulse: (!) 59  Temp: (!) 97.4 F (36.3 C)  TempSrc: Temporal  SpO2: 99%  Weight: 112 lb 6.4 oz (51 kg)  Height: 5' (1.524 m)   Body mass index is 21.95 kg/m.   General: Well developed, well nourished. No acute distress. Psych: Alert and oriented. Normal mood and affect.  Health Maintenance Due  Topic Date Due   TETANUS/TDAP  Never done   Zoster Vaccines- Shingrix (1 of 2) Never done   INFLUENZA VACCINE  01/10/2022   Lab Results  Last lipids Lab Results  Component Value Date   CHOL 189 12/01/2021    HDL 55.80 12/01/2021   LDLCALC 78 01/21/2021   LDLDIRECT 91.0 12/01/2021   TRIG 214.0 (H) 12/01/2021   CHOLHDL 3 12/01/2021   Last thyroid functions Lab Results  Component Value Date   TSH 0.22 (L) 12/01/2021   Imaging: MR Lumbar Spine w wo contrast (12/10/2021) IMPRESSION: 1. Interval PLIF at L1-2 without residual or recurrent stenosis. 2. Multilevel degenerative spondylosis elsewhere throughout the lumbar spine with resultant mild left lateral recess and foraminal stenosis at L2-3 and L3-4, and mild to moderate bilateral foraminal narrowing at L4-5 and L5-S1 as above. 3. Advanced right-sided facet arthrosis at L5-S1 with associated reactive marrow edema, which could serve as a source for lower back pain.   Assessment & Plan:   1. Essential hypertension Blood pressure is adequately controlled. Continue amlodipine 5 mg daily.  2. Acquired hypothyroidism We will recheck the TSH today. She is on levothyroxine 75 mcg daily. We will adjust the dose if necessary.  - TSH  3. Mixed hyperlipidemia Lipids at goal. Continue pravastatin 40 mg daily.  4. Lumbar post-laminectomy syndrome Continue to follow with orthopedics and physiatry.  5. Need for influenza vaccination  - Flu Vaccine QUAD High Dose(Fluad)  Return in about 3 months (around 06/02/2022) for Reassessment.   Haydee Salter, MD

## 2022-03-07 ENCOUNTER — Encounter: Payer: Self-pay | Admitting: Specialist

## 2022-03-07 NOTE — Progress Notes (Signed)
Ana Santos - 81 y.o. female MRN [id number]  Date of birth: 1940-10-30  Office Visit Note: Visit Date: 02/23/2022 PCP: Haydee Salter, MD Referred by: Haydee Salter, MD  Subjective: Chief Complaint  Patient presents with   Lower Back - Injections   HPI:  Ana Santos is a 81 y.o. female who comes in today at the request of Dr. [name] for planned Right L1-2 Lumbar Transforaminal epidural steroid injection with fluoroscopic guidance.  The patient has failed conservative care including home exercise, medications, time and activity modification.  This injection will be diagnostic and hopefully therapeutic.  Please see requesting physician notes for further details and justification.   ROS Otherwise per HPI.  Assessment & Plan: Visit Diagnoses:    ICD-10-CM   1. Lumbar radiculopathy  M54.16 XR C-ARM NO REPORT    Epidural Steroid injection    dexamethasone (DECADRON) injection 10 mg      Plan: No additional findings.   Meds & Orders:  Meds ordered this encounter  Medications   dexamethasone (DECADRON) injection 10 mg    Orders Placed This Encounter  Procedures   XR C-ARM NO REPORT   Epidural Steroid injection    Follow-up: Return for visit to requesting provider as needed.   Procedures: No procedures performed  Lumbosacral Transforaminal Epidural Steroid Injection - Sub-Pedicular Approach with Fluoroscopic Guidance  Patient: Ana Santos      Date of Birth: 02/14/1941 MRN: [id number] PCP: Haydee Salter, MD      Visit Date: 02/23/2022   Universal Protocol:    Date/Time: 02/23/2022  Consent Given By: the patient  Position: PRONE  Additional Comments: Vital signs were monitored before and after the procedure. Patient was prepped and draped in the usual sterile fashion. The correct patient, procedure, and site was verified.   Injection Procedure Details:   Procedure diagnoses: Lumbar radiculopathy [M54.16]    Meds Administered:  Meds  ordered this encounter  Medications   dexamethasone (DECADRON) injection 10 mg    Laterality: Left  Location/Site: L1  Needle:5.0 in., 22 ga.  Short bevel or Quincke spinal needle  Needle Placement: Transforaminal  Findings:    -Comments: Excellent flow of contrast along the nerve, nerve root and into the epidural space.  Procedure Details: After squaring off the end-plates to get a true AP view, the C-arm was positioned so that an oblique view of the foramen as noted above was visualized. The target area is just inferior to the "nose of the scotty dog" or sub pedicular. The soft tissues overlying this structure were infiltrated with 2-3 ml. of 1% Lidocaine without Epinephrine.  The spinal needle was inserted toward the target using a "trajectory" view along the fluoroscope beam.  Under AP and lateral visualization, the needle was advanced so it did not puncture dura and was located close the 6 O'Clock position of the pedical in AP tracterory. Biplanar projections were used to confirm position. Aspiration was confirmed to be negative for CSF and/or blood. A 1-2 ml. volume of Isovue-250 was injected and flow of contrast was noted at each level. Radiographs were obtained for documentation purposes.   After attaining the desired flow of contrast documented above, a 0.5 to 1.0 ml test dose of 0.25% Marcaine was injected into each respective transforaminal space.  The patient was observed for 90 seconds post injection.  After no sensory deficits were reported, and normal lower extremity motor function was noted,   the above injectate was administered so  that equal amounts of the injectate were placed at each foramen (level) into the transforaminal epidural space.   Additional Comments:  The patient tolerated the procedure well Dressing: 2 x 2 sterile gauze and Band-Aid    Post-procedure details: Patient was observed during the procedure. Post-procedure instructions were  reviewed.  Patient left the clinic in stable condition.    Clinical History: No specialty comments available.     Objective:  VS:  HT:    WT:   BMI:     BP:   HR: bpm  TEMP: ( )  RESP:  Physical Exam Vitals and nursing note reviewed.  Constitutional:      General: She is not in acute distress.    Appearance: Normal appearance. She is not ill-appearing.  HENT:     Head: Normocephalic and atraumatic.     Right Ear: External ear normal.     Left Ear: External ear normal.  Eyes:     Extraocular Movements: Extraocular movements intact.  Cardiovascular:     Rate and Rhythm: Normal rate.     Pulses: Normal pulses.  Pulmonary:     Effort: Pulmonary effort is normal. No respiratory distress.  Abdominal:     General: There is no distension.     Palpations: Abdomen is soft.  Musculoskeletal:        General: Tenderness present.     Cervical back: Neck supple.     Right lower leg: No edema.     Left lower leg: No edema.     Comments: Patient has good distal strength with no pain over the greater trochanters.  No clonus or focal weakness.  Skin:    Findings: No erythema, lesion or rash.  Neurological:     General: No focal deficit present.     Mental Status: She is alert and oriented to person, place, and time.     Sensory: No sensory deficit.     Motor: No weakness or abnormal muscle tone.     Coordination: Coordination normal.  Psychiatric:        Mood and Affect: Mood normal.        Behavior: Behavior normal.      Imaging: No results found.

## 2022-03-07 NOTE — Progress Notes (Signed)
Appointment was cancelled. Patient not seen or examined.

## 2022-03-07 NOTE — Procedures (Signed)
Lumbosacral Transforaminal Epidural Steroid Injection - Sub-Pedicular Approach with Fluoroscopic Guidance  Patient: Ana Santos      Date of Birth: Dec 29, 1940 MRN: [id number] PCP: Haydee Salter, MD      Visit Date: 02/23/2022   Universal Protocol:    Date/Time: 02/23/2022  Consent Given By: the patient  Position: PRONE  Additional Comments: Vital signs were monitored before and after the procedure. Patient was prepped and draped in the usual sterile fashion. The correct patient, procedure, and site was verified.   Injection Procedure Details:   Procedure diagnoses: Lumbar radiculopathy [M54.16]    Meds Administered:  Meds ordered this encounter  Medications   dexamethasone (DECADRON) injection 10 mg    Laterality: Left  Location/Site: L1  Needle:5.0 in., 22 ga.  Short bevel or Quincke spinal needle  Needle Placement: Transforaminal  Findings:    -Comments: Excellent flow of contrast along the nerve, nerve root and into the epidural space.  Procedure Details: After squaring off the end-plates to get a true AP view, the C-arm was positioned so that an oblique view of the foramen as noted above was visualized. The target area is just inferior to the "nose of the scotty dog" or sub pedicular. The soft tissues overlying this structure were infiltrated with 2-3 ml. of 1% Lidocaine without Epinephrine.  The spinal needle was inserted toward the target using a "trajectory" view along the fluoroscope beam.  Under AP and lateral visualization, the needle was advanced so it did not puncture dura and was located close the 6 O'Clock position of the pedical in AP tracterory. Biplanar projections were used to confirm position. Aspiration was confirmed to be negative for CSF and/or blood. A 1-2 ml. volume of Isovue-250 was injected and flow of contrast was noted at each level. Radiographs were obtained for documentation purposes.   After attaining the desired flow of contrast  documented above, a 0.5 to 1.0 ml test dose of 0.25% Marcaine was injected into each respective transforaminal space.  The patient was observed for 90 seconds post injection.  After no sensory deficits were reported, and normal lower extremity motor function was noted,   the above injectate was administered so that equal amounts of the injectate were placed at each foramen (level) into the transforaminal epidural space.   Additional Comments:  The patient tolerated the procedure well Dressing: 2 x 2 sterile gauze and Band-Aid    Post-procedure details: Patient was observed during the procedure. Post-procedure instructions were reviewed.  Patient left the clinic in stable condition.

## 2022-03-10 ENCOUNTER — Telehealth: Payer: Self-pay | Admitting: Specialist

## 2022-03-10 NOTE — Telephone Encounter (Signed)
Patient called asked if she can have something called into her pharmacy for pain. Patient said she ran out of the Oxycodone a couple of weeks now.  [facility] [address] [employer]. The number to contact patient is [phone]

## 2022-03-13 ENCOUNTER — Other Ambulatory Visit: Payer: Self-pay | Admitting: Specialist

## 2022-03-22 ENCOUNTER — Ambulatory Visit (INDEPENDENT_AMBULATORY_CARE_PROVIDER_SITE_OTHER): Payer: Medicare Other | Admitting: Specialist

## 2022-03-22 ENCOUNTER — Encounter: Payer: Self-pay | Admitting: Specialist

## 2022-03-22 VITALS — BP 164/61 | HR 62 | Ht 60.0 in | Wt 118.0 lb

## 2022-03-22 DIAGNOSIS — M792 Neuralgia and neuritis, unspecified: Secondary | ICD-10-CM | POA: Diagnosis not present

## 2022-03-22 DIAGNOSIS — Z981 Arthrodesis status: Secondary | ICD-10-CM

## 2022-03-22 DIAGNOSIS — M961 Postlaminectomy syndrome, not elsewhere classified: Secondary | ICD-10-CM | POA: Diagnosis not present

## 2022-03-22 MED ORDER — CYCLOBENZAPRINE HCL 10 MG PO TABS: 10.0000 mg | ORAL_TABLET | Freq: Three times a day (TID) | ORAL | 1 refills | Status: DC | PRN

## 2022-03-22 MED ORDER — MELOXICAM 15 MG PO TBDP: 1.0000 | ORAL_TABLET | Freq: Every day | ORAL | 3 refills | Status: DC

## 2022-03-22 NOTE — Patient Instructions (Signed)
Avoid bending, stooping and avoid lifting weights greater than 10 lbs. Avoid prolong standing and walking. Avoid frequent bending and stooping  No lifting greater than 10 lbs. May use ice or moist heat for pain. Weight loss is of benefit. Handicap license is approved.  

## 2022-03-22 NOTE — Progress Notes (Signed)
Office Visit Note   Patient: Ana Santos           Date of Birth: July 01, 1940           MRN: [id number] Visit Date: 03/22/2022              Requested by: Haydee Salter, MD [place],  [place] [id number] PCP: Haydee Salter, MD   Assessment & Plan: Visit Diagnoses:  1. Post laminectomy syndrome   2. S/P lumbar fusion   3. Neurogenic pain     Plan: Avoid bending, stooping and avoid lifting weights greater than 10 lbs. Avoid prolong standing and walking. Avoid frequent bending and stooping  No lifting greater than 10 lbs. May use ice or moist heat for pain. Weight loss is of benefit. Handicap license is approved.  Follow-Up Instructions: Return in about 6 months (around 09/21/2022) for Return with Dr. Laurance Flatten for follow up of lumbar fusion L1-2.   Orders:  No orders of the defined types were placed in this encounter.  Meds ordered this encounter  Medications   Meloxicam 15 MG TBDP    Sig: Take 1 tablet by mouth daily with breakfast.    Dispense:  90 tablet    Refill:  3   cyclobenzaprine (FLEXERIL) 10 MG tablet    Sig: Take 1 tablet (10 mg total) by mouth 3 (three) times daily as needed for muscle spasms.    Dispense:  30 tablet    Refill:  1      Procedures: No procedures performed   Clinical Data: No additional findings.   Subjective: No chief complaint on file.   81 year old female now almost 7 months following TLIF 1-2 for ddd and foramenal protrusion of disc. Had recent TF ESI done left L1-2 . Pain levels are about 4/10.     Review of Systems  Constitutional: Negative.   HENT: Negative.    Eyes: Negative.   Respiratory: Negative.    Cardiovascular: Negative.   Gastrointestinal: Negative.   Endocrine: Negative.   Genitourinary: Negative.   Musculoskeletal: Negative.   Skin: Negative.   Allergic/Immunologic: Negative.   Neurological: Negative.   Hematological: Negative.   Psychiatric/Behavioral: Negative.        Objective: Vital Signs: BP (!) 164/61   Pulse 62   Ht 5' (1.524 m)   Wt 118 lb (53.5 kg)   BMI 23.05 kg/m   Physical Exam Constitutional:      Appearance: She is well-developed.  HENT:     Head: Normocephalic and atraumatic.  Eyes:     Pupils: Pupils are equal, round, and reactive to light.  Pulmonary:     Effort: Pulmonary effort is normal.     Breath sounds: Normal breath sounds.  Abdominal:     General: Bowel sounds are normal.     Palpations: Abdomen is soft.  Musculoskeletal:     Cervical back: Normal range of motion and neck supple.     Lumbar back: Negative right straight leg raise test and negative left straight leg raise test.  Skin:    General: Skin is warm and dry.  Neurological:     Mental Status: She is alert and oriented to person, place, and time.  Psychiatric:        Behavior: Behavior normal.        Thought Content: Thought content normal.        Judgment: Judgment normal.    Back Exam   Tenderness  The patient is experiencing tenderness in the lumbar.  Range of Motion  Extension:  abnormal  Flexion:  abnormal  Lateral bend right:  abnormal  Lateral bend left:  abnormal  Rotation right:  abnormal  Rotation left:  abnormal   Muscle Strength  Right Quadriceps:  5/5  Left Quadriceps:  5/5  Right Hamstrings:  5/5  Left Hamstrings:  5/5   Tests  Straight leg raise right: negative Straight leg raise left: negative  Other  Toe walk: normal Heel walk: normal     Specialty Comments:  No specialty comments available.  Imaging: No results found.   PMFS History: Patient Active Problem List   Diagnosis Date Noted   Other secondary scoliosis, lumbar region 08/23/2021    Priority: High    Class: Chronic   Oxygen desaturation 08/24/2021   Fusion of spine of lumbar region 08/23/2021   Lumbar post-laminectomy syndrome 06/15/2021   Status post lumbar microdiscectomy 03/09/2021   Recurrent herniation of lumbar disc 02/28/2021    Foraminal stenosis due to intervertebral disc disease 01/21/2021   History of bladder cancer 01/21/2021   Labyrinthitis 12/08/2019   Left-sided low back pain without sciatica 05/05/2019   Visual loss, transient, bilateral 02/21/2019   Essential hypertension 02/21/2019   Colonic mass s/p lap ileocectomy 03/14/2018 03/14/2018   Acute lower GI bleeding 03/14/2018   Chronic anticoagulation 03/14/2018   Chronic tension-type headache, not intractable 05/31/2015   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 ([place]) 03/11/2014   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   RLS (restless legs syndrome) 07/04/2012   History of cerebral aneurysm 03/26/2009   Vitamin D deficiency 08/10/2008   Venous insufficiency 08/10/2008   History of acoustic neuroma 08/18/2007   Hypothyroidism 08/18/2007   Tobacco use disorder, continuous 08/18/2007   Mixed hyperlipidemia 07/18/2007   Anxiety with depression 07/18/2007   COPD (chronic obstructive pulmonary disease) with chronic bronchitis ([place]) 07/18/2007   Fibromyalgia 07/18/2007   Past Medical History:  Diagnosis Date   AN (acoustic neuroma) ([place])    deafness in R ear   Aneurysm ([place])    basilar tip aneursym s/p stent assisted coiling 04/26/09   Anxiety    Bladder cancer ([place])    Cataract    Cigarette smoker    COPD (chronic obstructive pulmonary disease) ([facility])    mild, no inhalers or oxygen used   Coronary artery disease    no current cardiologist  pt. denies   Depression    Headache    tension headaches due to aneurysm repair with stent   Hyperlipidemia    Hypertension    Hypothyroidism    IBS (irritable bowel syndrome)    Mitral valve prolapse    mild takes beta blocker   Osteopenia    Stroke ([place])    x 3 ministrokes last one feb 2014   Venous insufficiency    Vitamin D deficiency     Family History  Problem Relation Age of Onset   Heart disease Mother    Cancer Mother        Lung   Heart attack Mother     Diabetes Mother    Cancer Father        Lung   Diabetes Sister    Cancer Sister        unkown type   Diabetes Brother    Lung cancer Brother        Lung   AAA (abdominal aortic aneurysm) Neg Hx    Colon cancer  Neg Hx    Stomach cancer Neg Hx    Esophageal cancer Neg Hx    Liver cancer Neg Hx    Pancreatic cancer Neg Hx    Rectal cancer Neg Hx     Past Surgical History:  Procedure Laterality Date   ANEURYSM COILING  04/2009   Dr. Estanislado Pandy   APPENDECTOMY     BACK SURGERY  02/28/2021   BREAST LUMPECTOMY Right 1986   benign   DIAGNOSTIC LAPAROSCOPY     Proable laparoscopic right colectomy 03-14-18 Dr. Excell Seltzer   EYE SURGERY Bilateral    ioc lens for cataracts   LAPAROSCOPIC RIGHT COLECTOMY Right 03/14/2018   Procedure: LAPAROSCOPIC ASSISTED  RIGHT COLECTOMY;  Surgeon: Excell Seltzer, MD;  Location: [facility];  Service: General;  Laterality: Right;   LAPAROSCOPY N/A 03/14/2018   Procedure: LAPAROSCOPY;  Surgeon: Excell Seltzer, MD;  Location: [facility];  Service: General;  Laterality: N/A;   LEFT HEART CATHETERIZATION WITH CORONARY ANGIOGRAM N/A 08/05/2014   Procedure: LEFT HEART CATHETERIZATION WITH CORONARY ANGIOGRAM;  Surgeon: Wellington Hampshire, MD;  Location: [facility];  Service: Cardiovascular;  Laterality: N/A;   LUMBAR LAMINECTOMY N/A 02/28/2021   Procedure: LEFT L1-L2 MICRODISCECTOMY AND FASCIECTOMY;  Surgeon: Marybelle Killings, MD;  Location: [facility];  Service: Orthopedics;  Laterality: N/A;   TONSILLECTOMY     TOTAL ABDOMINAL HYSTERECTOMY  1970   complete   TRANSLABYRINTHINE PROCEDURE  2002   [facility] Dr. Vicie Mutters tumor removal   TRANSURETHRAL RESECTION OF BLADDER TUMOR N/A 02/08/2018   Procedure: TRANSURETHRAL RESECTION OF BLADDER TUMOR (TURBT)/ POSTOPERATIVE INSTILLATION OF CHEMO THERAPY;  Surgeon: Festus Aloe, MD;  Location: [facility];  Service: Urology;  Laterality: N/A;   Social History   Occupational History   Occupation: alterations    Occupation: Retired    Comment: Regulatory affairs officer  Tobacco Use   Smoking status: Every Day    Packs/day: 0.25    Years: 50.00    Total pack years: 12.50    Types: Cigarettes   Smokeless tobacco: Never   Tobacco comments:    4-5 cigs daily  is aware she needs to quit  Vaping Use   Vaping Use: Never used  Substance and Sexual Activity   Alcohol use: No    Alcohol/week: 0.0 standard drinks of alcohol   Drug use: No   Sexual activity: Not Currently

## 2022-03-30 ENCOUNTER — Telehealth: Payer: Self-pay | Admitting: Physical Medicine and Rehabilitation

## 2022-03-30 NOTE — Telephone Encounter (Signed)
Dr. Britt Bolognese you please advise? Patient last saw you in the office. Thanks.

## 2022-03-30 NOTE — Telephone Encounter (Signed)
Pt called stating she had injection with Dr Ernestina Patches last week and it has made her pain worse and asking for pain medication. Pt states she not sure if Dr Louanne Skye or Dr Ernestina Patches sen din pain medication for her. She state Dr Louanne Skye sent referral. Pt is asking for pain medication for severe pain. Please send to pharmacy on file. Pt phone number is [phone].

## 2022-04-11 ENCOUNTER — Encounter: Payer: Self-pay | Admitting: Orthopedic Surgery

## 2022-04-11 ENCOUNTER — Ambulatory Visit (INDEPENDENT_AMBULATORY_CARE_PROVIDER_SITE_OTHER): Payer: Medicare Other | Admitting: Orthopedic Surgery

## 2022-04-11 ENCOUNTER — Ambulatory Visit (INDEPENDENT_AMBULATORY_CARE_PROVIDER_SITE_OTHER): Payer: Medicare Other

## 2022-04-11 VITALS — BP 147/65 | HR 57 | Ht 60.0 in | Wt 118.0 lb

## 2022-04-11 DIAGNOSIS — M961 Postlaminectomy syndrome, not elsewhere classified: Secondary | ICD-10-CM

## 2022-04-11 DIAGNOSIS — M546 Pain in thoracic spine: Secondary | ICD-10-CM | POA: Diagnosis not present

## 2022-04-11 NOTE — Progress Notes (Addendum)
Orthopedic Spine Surgery Office Note  Assessment: Patient is a 81 y.o. female with chronic, progressive thoracic and lumbar back pain.  No radicular pain but does have symptoms of imbalance and physical exam findings of imbalance   Plan: -Explained that initially conservative treatment is tried as a significant number of patients may experience relief with these treatment modalities. Discussed that the conservative treatments include:  -activity modification  -physical therapy  -over the counter pain medications -Patient has tried physical therapy, home exercises, NSAIDs, Tylenol, steroid injection, patches, creams, operative intervention -Patient asked for narcotic prescription at the end of the visit.  Explained that I only prescribe narcotic medications for postoperative pain.  I offered her referral to pain management.  She was interested in this for her long-term pain.  A referral was provided to her today. -Since she has had symptoms of thoracic back pain for 2 years now and has some symptoms of imbalance, recommended MRI of the thoracic spine.  That was ordered today in the office. -Patient should return to office in 3 weeks, repeat x-rays of lumbar spine at next visit: None   Patient expressed understanding of the plan and all questions were answered to the patient's satisfaction.   ___________________________________________________________________________   History:  Patient is a 81 y.o. female who presents today for thoracic and lumbar spine.  Patient has had multiple years of low back pain.  She has a history of undergoing an L1-2 fusion and TLIF.  She got some relief of her leg pain with the surgery but has had persistent low back pain.  She also has thoracic back pain that started about 2 years ago.  Both her thoracic and lumbar back pain have gotten progressively worse with time.  Pain is felt worse with activity but is even noted at rest.  She does not have any pain radiate  into her legs.  Has felt that she is weak in her legs and does get the sensation of imbalance when walking.   Weakness: Yes, bilateral legs feel weaker Symptoms of imbalance: Yes, feels off balance when walking Paresthesias and numbness: Denies Bowel or bladder incontinence: Yes, has chronic history of urinary incontinence.  No new bowel or bladder symptoms Saddle anesthesia: Denies  Treatments tried: physical therapy, home exercises, NSAIDs, Tylenol, steroid injection, patches, creams, operative intervention  Review of systems: Denies fevers and chills, night sweats, unexplained weight loss, history of cancer, pain that wakes them at night  Past medical history: Hyperlipidemia Hypertension Cancer Migraines Depression Anxiety Stroke COPD Chronic pain  Allergies: Prednisone, atorvastatin, hydrocodone, morphine, nortriptyline, Topamax, tramadol  Past surgical history:  Back surgery x2 Eye surgery Breast lumpectomy Laparoscopic colectomy Tonsillectomy Hysterectomy Resection of bladder tumor  Social history: Reports use of nicotine product (smoking, vaping, patches, smokeless) -couple of cigarettes per day Alcohol use: Denies Denies recreational drug use   Physical Exam:  General: no acute distress, appears stated age Neurologic: alert, answering questions appropriately, following commands Respiratory: unlabored breathing on room air, symmetric chest rise Psychiatric: appropriate affect, normal cadence to speech  Posterior spine incision appears well healed with no evidence of infection  MSK (spine):  -Strength exam      Left  Right EHL    4/5  4/5 TA    5/5  5/5 GSC    5/5  5/5 Knee extension  5/5  5/5 Hip flexion   4/5  4/5  Hip flexion strength testing limited by pain and low back  -Sensory exam    Sensation  intact to light touch in L3-S1 nerve distributions of bilateral lower extremities  -Achilles DTR: 1/4 on the left, 1/4 on the right -Patellar  tendon DTR: 0/4 on the left, 0/4 on the right  -Straight leg raise: Negative -Contralateral straight leg raise: Negative -Clonus: 2 beats on the left side, no beats on the right side  -Left hip exam: Some pain with internal rotation, no pain through remainder of range of motion, negative Stinchfield -Right hip exam: Some low back pain with internal rotation, no pain through remainder of range of motion, negative Stinchfield  -Short stepped gait, unsteadiness with tandem gait, positive Romberg  Imaging: X-ray of the lumbar spine from 04/11/2022 was independently reviewed and interpreted, showing L1/2 interbody device and instrumentation with no lucency around the implants.  The screws appear to be in similar position and have not backed out.  No fracture or dislocation. Multi-level facet arthropathy. No evidence of instability on flexion extension. Disc height loss at L2/3.   XR of the thoracic spine from 04/11/2022 was independently reviewed and interpreted, showing no fracture or dislocation. Kyphotic alignment. Disc height loss at multiple levels.    Patient name: Ana Santos Patient MRN: [id number] Date of visit: 04/11/22

## 2022-04-17 ENCOUNTER — Telehealth: Payer: Self-pay

## 2022-04-17 NOTE — Telephone Encounter (Signed)
This nurse attempted to call patient three times for telephonic AWV. Message left that we will call again to reschedule for another time.

## 2022-04-19 ENCOUNTER — Other Ambulatory Visit: Payer: Self-pay | Admitting: Radiology

## 2022-04-19 MED ORDER — NAPROXEN 500 MG PO TABS: 500.0000 mg | ORAL_TABLET | Freq: Two times a day (BID) | ORAL | 2 refills | Status: DC

## 2022-04-24 ENCOUNTER — Encounter: Payer: Self-pay | Admitting: Physical Medicine & Rehabilitation

## 2022-05-01 ENCOUNTER — Ambulatory Visit
Admission: RE | Admit: 2022-05-01 | Discharge: 2022-05-01 | Disposition: A | Discharge status: Home / Self Care | Payer: Medicare Other | Source: Ambulatory Visit | Attending: Orthopedic Surgery | Admitting: Orthopedic Surgery

## 2022-05-01 DIAGNOSIS — M4804 Spinal stenosis, thoracic region: Secondary | ICD-10-CM | POA: Diagnosis not present

## 2022-05-01 DIAGNOSIS — G8929 Other chronic pain: Secondary | ICD-10-CM | POA: Diagnosis not present

## 2022-05-01 DIAGNOSIS — M546 Pain in thoracic spine: Secondary | ICD-10-CM

## 2022-05-01 DIAGNOSIS — M5134 Other intervertebral disc degeneration, thoracic region: Secondary | ICD-10-CM | POA: Diagnosis not present

## 2022-05-01 DIAGNOSIS — M5124 Other intervertebral disc displacement, thoracic region: Secondary | ICD-10-CM | POA: Diagnosis not present

## 2022-05-03 ENCOUNTER — Ambulatory Visit (INDEPENDENT_AMBULATORY_CARE_PROVIDER_SITE_OTHER): Payer: Medicare Other | Admitting: Orthopedic Surgery

## 2022-05-03 DIAGNOSIS — G8929 Other chronic pain: Secondary | ICD-10-CM | POA: Diagnosis not present

## 2022-05-03 DIAGNOSIS — M546 Pain in thoracic spine: Secondary | ICD-10-CM

## 2022-05-03 MED ORDER — METHOCARBAMOL 500 MG PO TABS: 500.0000 mg | ORAL_TABLET | Freq: Four times a day (QID) | ORAL | 0 refills | Status: DC | PRN

## 2022-05-03 NOTE — Progress Notes (Signed)
Orthopedic Spine Surgery Office Note  Assessment: Patient is a 81 y.o. female with chronic thoracic back pain that has not improved at all since last time she was seen. No radicular type pain. No significant stenosis on thoracic MRI.    Plan: -Patient has referral to pain management and is scheduled to see them on December 15. Prescribed her robaxin to give her some additional pain relief until she can get to that appointment.  -Continue with heat, topical voltaren gel, tylenol, NSAIDs -Patient has tried physical therapy, home exercises, NSAIDs, Tylenol, steroid injection, patches, creams, operative intervention  -Patient should return to office in 4 months, repeat x-rays of lumbar spine at next visit: lumbar AP/lat/flex/ex   Patient expressed understanding of the plan and all questions were answered to the patient's satisfaction.   ___________________________________________________________________________  History: Patient is a 81 y.o. female who has been previously seen in the office for symptoms consistent with lumbar radiculopathy. Since the last visit, symptom intensity has remained the same. Feels the pain everyday. It is severe. Occasionally has left hip pain, but no other leg pain. No consistent leg pain. Able to walk without assistive devices. Denies paresthesias and numbness.  Previous treatments: physical therapy, home exercises, NSAIDs, Tylenol, steroid injection, patches, creams, operative intervention   COPY of OLD NOTE Patient is a 81 y.o. female who presents today for thoracic and lumbar spine.  Patient has had multiple years of low back pain.  She has a history of undergoing an L1-2 fusion and TLIF.  She got some relief of her leg pain with the surgery but has had persistent low back pain.  She also has thoracic back pain that started about 2 years ago.  Both her thoracic and lumbar back pain have gotten progressively worse with time.  Pain is felt worse with activity but is  even noted at rest.  She does not have any pain radiate into her legs.  Has felt that she is weak in her legs and does get the sensation of imbalance when walking.     Weakness: Yes, bilateral legs feel weaker Symptoms of imbalance: Yes, feels off balance when walking Paresthesias and numbness: Denies Bowel or bladder incontinence: Yes, has chronic history of urinary incontinence.  No new bowel or bladder symptoms Saddle anesthesia: Denies END OF COPY  Physical Exam:  General: no acute distress, appears stated age, smell of cigarette smoke Neurologic: alert, answering questions appropriately, following commands Respiratory: unlabored breathing on room air, symmetric chest rise Psychiatric: appropriate affect, normal cadence to speech   MSK (spine):  -Strength exam      Left  Right EHL    4/5  4/5 TA    5/5  5/5 GSC    5/5  5/5 Knee extension  5/5  5/5 Hip flexion   5/5  5/5  -Sensory exam    Sensation intact to light touch in L3-S1 nerve distributions of bilateral lower extremities  -Achilles DTR: 1/4 on the left, 1/4 on the right -Patellar tendon DTR: 1/4 on the left, 1/4 on the right  -Straight leg raise: negative -Contralateral straight leg raise: negative -Clonus: no beats bilaterally  Imaging: X-ray of the lumbar spine from 04/11/2022 was previously independently reviewed and interpreted, showing L1/2 interbody device and instrumentation with no lucency around the implants.  The screws appear to be in similar position and have not backed out.  No fracture or dislocation. Multi-level facet arthropathy. No evidence of instability on flexion extension. Disc height loss at L2/3.  XR of the thoracic spine from 04/11/2022 was previously independently reviewed and interpreted, showing no fracture or dislocation. Kyphotic alignment. Disc height loss at multiple levels.   MRI of the lumbar spine from 11/20/203 was independently reviewed and interpreted, showing T1 and T2  signal hyperintensity at T7, consistent with hemangioma. No other osseous lesions. Has a disc bulge with foraminal stenosis at t1/2. No other significant stenosis.    Patient name: Ana Santos Patient MRN: [id number] Date of visit: 05/03/22

## 2022-05-08 ENCOUNTER — Telehealth: Payer: Self-pay | Admitting: Orthopedic Surgery

## 2022-05-08 MED ORDER — METHOCARBAMOL 500 MG PO TABS: 500.0000 mg | ORAL_TABLET | Freq: Four times a day (QID) | ORAL | 0 refills | Status: AC | PRN

## 2022-05-08 NOTE — Telephone Encounter (Signed)
Patient called in stating she needs her Methocarbamol '500MG'$  moved over to the [facility] on [address] [place] because [facility] does not have it

## 2022-05-08 NOTE — Addendum Note (Signed)
Addended by: Ileene Rubens on: 05/08/2022 04:22 PM   Modules accepted: Orders

## 2022-05-08 NOTE — Telephone Encounter (Signed)
Please advise 

## 2022-05-08 NOTE — Telephone Encounter (Signed)
I called patient and advised. 

## 2022-05-26 ENCOUNTER — Encounter: Payer: Medicare Other | Attending: Physical Medicine & Rehabilitation | Admitting: Physical Medicine & Rehabilitation

## 2022-05-26 ENCOUNTER — Encounter: Payer: Self-pay | Admitting: Physical Medicine & Rehabilitation

## 2022-05-26 VITALS — BP 155/79 | HR 80 | Ht 60.0 in | Wt 119.6 lb

## 2022-05-26 DIAGNOSIS — Z79891 Long term (current) use of opiate analgesic: Secondary | ICD-10-CM | POA: Diagnosis not present

## 2022-05-26 DIAGNOSIS — M961 Postlaminectomy syndrome, not elsewhere classified: Secondary | ICD-10-CM | POA: Diagnosis not present

## 2022-05-26 DIAGNOSIS — M533 Sacrococcygeal disorders, not elsewhere classified: Secondary | ICD-10-CM | POA: Insufficient documentation

## 2022-05-26 DIAGNOSIS — G894 Chronic pain syndrome: Secondary | ICD-10-CM | POA: Insufficient documentation

## 2022-05-26 DIAGNOSIS — Z5181 Encounter for therapeutic drug level monitoring: Secondary | ICD-10-CM | POA: Insufficient documentation

## 2022-05-26 NOTE — Progress Notes (Addendum)
Subjective:    Patient ID: Ana Santos, female    DOB: [birth year], 81 y.o.   MRN: [id number]  HPI Ana Santos is a 81 y.o. year old female  who  has a past medical history of AN (acoustic neuroma) ([place]), Aneurysm ([place]), Anxiety, Bladder cancer ([place]), Cataract, Cigarette smoker, COPD (chronic obstructive pulmonary disease) ([place]), Coronary artery disease, Depression, Headache, Hyperlipidemia, Hypertension, Hypothyroidism, IBS (irritable bowel syndrome), Mitral valve prolapse, Osteopenia, Stroke ([place]), Venous insufficiency, and Vitamin D deficiency.   They are presenting to PM&R clinic as a new patient for pain management evaluation. They were referred for treatment of chronic lumbar and thoracic back pain.   She reports that she has had worsening pain for about 4 years.  No consistent pain shooting down her legs.  Pain is stabbing in quality.  Lifting anything makes it worse.  Denies any numbness or paresthesias in her limbs.  She had a prior left L1-2 hemilaminectomy and then later a L1-L2 TLIF in March 2023.  She is previously followed by Dr. Louanne Skye.  She is now being seen by Dr. Ileene Rubens orthopedic spine surgery.  She reports a history of bowel surgery with small intestine removed previously.  Red flag symptoms: Patient denies saddle anesthesia, loss of bowel or bladder continence, new weakness, new numbness/tingling, or pain waking up at nighttime.  Medications tried: Tylenol, advil, lidcaine patch, icy hot with mild benefit She is currently on Plavix which limits her medication options Hydrocodone 10 mg previously helped control her pain  tramadol-this did help her pain however she had very severe withdrawal issues when she did not get her medication on time, would like to avoid this medication if possible Oxycodone helped her pain previously  Other treatments: PT/OT  made it worse Heat pad helps 10-15 ESI injections- reports minimal benefit  L1-2 hemilaminectomy and then  later a L1-L2 TLIF in March 2023.  Without sustained improvement     Pain Inventory Average Pain 8 Pain Right Now 8 My pain is constant, stabbing, tingling, and aching  In the last 24 hours, has pain interfered with the following? General activity 10 Relation with others 2 Enjoyment of life 10 What TIME of day is your pain at its worst? morning  Sleep (in general) Fair  Pain is worse with: walking, standing, and some activites Pain improves with: rest, heat/ice, and medication Relief from Meds: 2  walk without assistance how many minutes can you walk? 10 ability to climb steps?  yes do you drive?  yes  retired I need assistance with the following:  meal prep, household duties, and shopping  weakness trouble walking depression  Any changes since last visit?  yes  hs 2 back surgeries last one 08/2021, and 3 strokes last 3 yrs ago--mainly balance affected  Any changes since last visit?  no Primary care [place]    Family History  Problem Relation Age of Onset   Heart disease Mother    Cancer Mother        Lung   Heart attack Mother    Diabetes Mother    Cancer Father        Lung   Diabetes Sister    Cancer Sister        unkown type   Diabetes Brother    Lung cancer Brother        Lung   AAA (abdominal aortic aneurysm) Neg Hx    Colon cancer Neg Hx  Stomach cancer Neg Hx    Esophageal cancer Neg Hx    Liver cancer Neg Hx    Pancreatic cancer Neg Hx    Rectal cancer Neg Hx    Social History   Socioeconomic History   Marital status: Married    Spouse name: Jori Moll   Number of children: 5   Years of education: Not on file   Highest education level: Not on file  Occupational History   Occupation: alterations   Occupation: Retired    Comment: Regulatory affairs officer  Tobacco Use   Smoking status: Every Day    Packs/day: 0.25    Years: 50.00    Total pack years: 12.50    Types: Cigarettes   Smokeless tobacco: Never   Tobacco  comments:    4-5 cigs daily  is aware she needs to quit  Vaping Use   Vaping Use: Never used  Substance and Sexual Activity   Alcohol use: No    Alcohol/week: 0.0 standard drinks of alcohol   Drug use: No   Sexual activity: Not Currently  Other Topics Concern   Not on file  Social History Narrative   Pt is married, 5 sons (4 living), 14 grandchildren, 4 great grandchildren.   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   Social Determinants of Radio broadcast assistant Strain: [place]  (01/29/2020)   Overall Financial Resource Strain (CARDIA)    Difficulty of Paying Living Expenses: Not hard at all  Food Insecurity: No [place] (02/14/2022)   Hunger Vital Sign    Worried About Running Out of Food in the Last Year: Never true    [place] in the Last Year: Never true  Transportation Needs: No Transportation Needs (02/14/2022)   PRAPARE - Hydrologist (Medical): No    Lack of Transportation (Non-Medical): No  Physical Activity: Inactive (01/29/2020)   Exercise Vital Sign    Days of Exercise per Week: 0 days    Minutes of Exercise per Session: 0 min  Stress: No Stress Concern Present (01/29/2020)   [place]    Feeling of Stress : Not at all  Social Connections: Moderately Isolated (01/29/2020)   Social Connection and Isolation Panel [NHANES]    Frequency of Communication with Friends and Family: More than three times a week    Frequency of Social Gatherings with Friends and Family: Once a week    Attends Religious Services: Never    Marine scientist or Organizations: No    Attends Archivist Meetings: Never    Marital Status: Married   Past Surgical History:  Procedure Laterality Date   ANEURYSM COILING  04/2009   Dr. Estanislado Pandy   APPENDECTOMY     BACK SURGERY  02/28/2021   BREAST LUMPECTOMY Right 1986   benign    DIAGNOSTIC LAPAROSCOPY     Proable laparoscopic right colectomy 03-14-18 Dr. Excell Seltzer   EYE SURGERY Bilateral    ioc lens for cataracts   LAPAROSCOPIC RIGHT COLECTOMY Right 03/14/2018   Procedure: LAPAROSCOPIC ASSISTED  RIGHT COLECTOMY;  Surgeon: Excell Seltzer, MD;  Location: [facility];  Service: General;  Laterality: Right;   LAPAROSCOPY N/A 03/14/2018   Procedure: LAPAROSCOPY;  Surgeon: Excell Seltzer, MD;  Location: [facility];  Service: General;  Laterality: N/A;   LEFT HEART CATHETERIZATION WITH CORONARY ANGIOGRAM N/A 08/05/2014   Procedure: LEFT HEART CATHETERIZATION WITH CORONARY ANGIOGRAM;  Surgeon: Wellington Hampshire, MD;  Location[facility] C[facility];  Service: Cardiovascular;  Laterality: N/A;   LUMBAR LAMINECTOMY N/A 02/28/2021   Procedure: LEFT L1-L2 MICRODISCECTOMY AND FASCIECTOMY;  Surgeon: Marybelle Killings, MD;  Location: [facility];  Service: Orthopedics;  Laterality: N/A;   TONSILLECTOMY     TOTAL ABDOMINAL HYSTERECTOMY  1970   complete   TRANSLABYRINTHINE PROCEDURE  2002   [facility] Dr. Vicie Mutters tumor removal   TRANSURETHRAL RESECTION OF BLADDER TUMOR N/A 02/08/2018   Procedure: TRANSURETHRAL RESECTION OF BLADDER TUMOR (TURBT)/ POSTOPERATIVE INSTILLATION OF CHEMO THERAPY;  Surgeon: Festus Aloe, MD;  Location: [facility];  Service: Urology;  Laterality: N/A;   Past Medical History:  Diagnosis Date   AN (acoustic neuroma) ([place])    deafness in R ear   Aneurysm ([place])    basilar tip aneursym s/p stent assisted coiling 04/26/09   Anxiety    Bladder cancer (H[facility])    Cataract    Cigarette smoker    COPD (chronic obstructive pulmonary disease) (H[facility])    mild, no inhalers or oxygen used   Coronary artery disease    no current cardiologist  pt. denies   Depression    Headache    tension headaches due to aneurysm repair with stent   Hyperlipidemia    Hypertension    Hypothyroidism    IBS (irritable bowel syndrome)    Mitral valve prolapse    mild takes beta blocker    Osteopenia    Stroke ([place])    x 3 ministrokes last one feb 2014   Venous insufficiency    Vitamin D deficiency    BP (!) 155/79   Pulse 80   Ht 5' (1.524 m)   Wt 119 lb 9.6 oz (54.3 kg)   SpO2 98%   BMI 23.36 kg/m   Opioid Risk Score:   Fall Risk Score:  `1  Depression screen [facility] 2/9     05/26/2022    9:42 AM 02/14/2022   11:03 AM 12/01/2021    8:21 AM 08/03/2021    9:10 AM 05/03/2021    8:57 AM 11/29/2020    8:29 AM 01/29/2020    2:27 PM  Depression screen PHQ 2/9  Decreased Interest 0 0 '1 2 1 '$ 0 0  Down, Depressed, Hopeless '3 1 3 3 2 '$ 0 0  PHQ - 2 Score '3 1 4 5 3 '$ 0 0  Altered sleeping 0  0 3 0    Tired, decreased energy '3  3 3 3    '$ Change in appetite '3  3 3 3    '$ Feeling bad or failure about yourself  3  1 0 0    Trouble concentrating 0  0 3 2    Moving slowly or fidgety/restless 0  0 0 0    Suicidal thoughts 0  0 2 0    PHQ-9 Score '12  11 19 11    '$ Difficult doing work/chores Somewhat difficult  Not difficult at all Not difficult at all Not difficult at all      Review of Systems  Constitutional: Negative.   HENT: Negative.    Respiratory: Negative.    Cardiovascular: Negative.   Gastrointestinal:  Positive for diarrhea.       Has had portion of bowel removed  Endocrine: Negative.   Genitourinary: Negative.   Musculoskeletal:  Positive for back pain and gait problem.  Skin: Negative.   Allergic/Immunologic: Negative.   Neurological:  Positive for weakness.  Tingling in left leg  Hematological:  Bruises/bleeds easily.       Plavix  Psychiatric/Behavioral:  Positive for dysphoric mood.   All other systems reviewed and are negative.      Objective:   Physical Exam Gen: no distress, normal appearing HEENT: oral mucosa pink and moist, NCAT Cardio: Reg rate Chest: normal effort, normal rate of breathing Abd: soft, non-distended Ext: no edema Psych: pleasant, normal affect Skin: intact Neuro: Alert and awake, follows commands, cranial nerves II through  XII grossly intact, speech and language normal Sensation to light touch in all 4 extremities DTR 1 out of 4 in bilateral patella and ankles Strength  5 out of 5 right lower extremity and hip flexion, knee extension, ankle PF and DF Strength 4+ out of 5 in left lower extremity hip flexion, knee extension, ankle PF and DF-she reports this is chronic since her prior surgery No ankle clonus Musculoskeletal: SLR negative Fabere-positive left SI She had some tenderness to palpation left SI joint SI compression test positive on the left FADIR negative bilaterally Mild T-spine paraspinal tenderness Facet loading negative  MRI T spine 05/01/22 FINDINGS: Alignment:  Physiologic.   Vertebrae: No acute fracture, evidence of discitis, or aggressive bone lesion. T7 vertebral body hemangioma noted.   Cord:  Normal signal and morphology.   Paraspinal and other soft tissues: No acute paraspinal abnormality.   Disc levels:   Disc spaces: Degenerative disease with disc height loss at T10-11 and T11-12. Degenerative disease with disc height loss at T1-2. Partially visualized on the scout images is degenerative disease with disc height loss at C3-4, C4-5 and C5-6.   T1-T2: Mild broad-based disc bulge. Severe right and moderate left foraminal stenosis. No spinal stenosis.   T2-T3: No disc protrusion, foraminal stenosis or central canal stenosis.   T3-T4: No disc protrusion, foraminal stenosis or central canal stenosis.   T4-T5: No disc protrusion, foraminal stenosis or central canal stenosis.   T5-T6: No disc protrusion, foraminal stenosis or central canal stenosis.   T6-T7: No disc protrusion, foraminal stenosis or central canal stenosis.   T7-T8: No disc protrusion, foraminal stenosis or central canal stenosis.   T8-T9: No disc protrusion, foraminal stenosis or central canal stenosis.   T9-T10: No disc protrusion, foraminal stenosis or central canal stenosis.   T10-T11: Mild  broad-based disc bulge. No foraminal or central canal stenosis. Right perineural cyst noted.   T11-T12: No disc protrusion, foraminal stenosis or central canal stenosis.   IMPRESSION: 1. At T1-2 there is a mild broad-based disc bulge. Severe right and moderate left foraminal stenosis. 2. No acute osseous injury of the thoracic spine.  L spine MRI 12/10/21 FINDINGS: Segmentation: Standard. Lowest well-formed disc space labeled the L5-S1 level.   Alignment: Up to 4 mm anterolisthesis of L5 on S1, stable. Trace anterolisthesis of L3 on L4, also relatively stable. Straightening of the normal lumbar lordosis elsewhere. Underlying sigmoid scoliosis.   Vertebrae: Susceptibility artifact from interval PLIF at L1-2. Vertebral body height maintained without acute or chronic fracture. Bone marrow signal intensity within normal limits. Few small benign hemangiomata noted. No worrisome osseous lesions. Mild reactive marrow edema noted about the partially visualized T10-11 interspace. Reactive edema present about the right L5-S1 facet due to facet arthritis.   Conus medullaris and cauda equina: Conus extends to the L1-2 level. Conus and cauda equina appear normal.   Paraspinal and other soft tissues: Postoperative changes within the posterior paraspinous soft tissues without adverse features. Few scattered cysts noted about  the partially visualized kidneys, similar to prior, likely benign. No follow-up imaging recommended.   Disc levels:   T11-12: Seen only on sagittal projection. Mild disc bulge with reactive endplate spurring. Left worse than right facet hypertrophy. No significant spinal stenosis. Foramina appear patent.   T12-L1: Chronic endplate Schmorl's node deformities without significant disc bulge. Mild right-sided facet hypertrophy. No significant stenosis.   L1-2: Interval PLIF. No residual or recurrent spinal stenosis. Foramina appear patent.   L2-3: Disc bulge with  disc desiccation and intervertebral disc space narrowing. Disc bulging eccentric to the left. Reactive endplate spurring. Moderate left with mild right facet arthrosis. Resultant mild narrowing of the left lateral recess. Mild left L2 foraminal narrowing. Right neural foramen remains patent. Appearance is stable.   L3-4: Degenerative intervertebral disc space narrowing with diffuse disc bulge and disc desiccation. Disc bulging eccentric to the left with left-sided reactive endplate spurring. Resultant extraforaminal disc osteophyte contacts the exiting left L3 nerve root as it courses of the left neural foramen (series 101, image 20). Moderate left with mild right facet arthrosis. Resultant mild narrowing of the left lateral recess. Mild left L3 foraminal stenosis. Right neural foramen remains patent. Appearance is stable.   L4-5: Disc bulge with disc desiccation. Reactive endplate spurring. Mild to moderate facet hypertrophy. No more than mild narrowing of the lateral recesses bilaterally. Mild to moderate right with mild left L4 foraminal stenosis. Appearance is stable.   L5-S1: Anterolisthesis. Disc desiccation with broad posterior disc bulge. Severe right-sided facet arthrosis with reactive marrow edema. Moderate left-sided foraminal narrowing. No significant spinal stenosis. Mild right L5 foraminal stenosis. Appearance is similar.   IMPRESSION: 1. Interval PLIF at L1-2 without residual or recurrent stenosis. 2. Multilevel degenerative spondylosis elsewhere throughout the lumbar spine with resultant mild left lateral recess and foraminal stenosis at L2-3 and L3-4, and mild to moderate bilateral foraminal narrowing at L4-5 and L5-S1 as above. 3. Advanced right-sided facet arthrosis at L5-S1 with associated reactive marrow edema, which could serve as a source for lower back pain.  T-spine    Assessment & Plan:  Chronic lumbar and thoracic back pain and failed back syndrome, I  also suspect she has some SI joint dysfunction on the left -Hx of  L1-L2 TLIF -UDS and pain agreement completed today -Consider restart hydrocodone at 5 mg dose after results of UDS complete -Will report to Dr. Letta Pate for left SI joint injection  12/27 THC noted in UDS, pt reports she tried this once a few weeks prior to see if it would help her pain. Pain was not improved and she has not used it since. Will plan to recheck UDS next visit

## 2022-06-01 ENCOUNTER — Telehealth: Payer: Self-pay | Admitting: *Deleted

## 2022-06-01 LAB — TOXASSURE SELECT,+ANTIDEPR,UR

## 2022-06-01 NOTE — Telephone Encounter (Signed)
Urine drug screen is positive for marijuana and prescribed sertraline.

## 2022-06-07 ENCOUNTER — Encounter: Payer: Self-pay | Admitting: Physical Medicine & Rehabilitation

## 2022-06-08 ENCOUNTER — Encounter: Payer: Self-pay | Admitting: Family Medicine

## 2022-06-08 ENCOUNTER — Ambulatory Visit (INDEPENDENT_AMBULATORY_CARE_PROVIDER_SITE_OTHER): Payer: Medicare Other | Admitting: Family Medicine

## 2022-06-08 VITALS — BP 126/68 | HR 59 | Temp 97.0°F | Ht 60.0 in | Wt 119.4 lb

## 2022-06-08 DIAGNOSIS — I10 Essential (primary) hypertension: Secondary | ICD-10-CM | POA: Diagnosis not present

## 2022-06-08 DIAGNOSIS — M961 Postlaminectomy syndrome, not elsewhere classified: Secondary | ICD-10-CM

## 2022-06-08 MED ORDER — TRAMADOL HCL 50 MG PO TABS: 50.0000 mg | ORAL_TABLET | Freq: Two times a day (BID) | ORAL | 0 refills | Status: AC | PRN

## 2022-06-08 NOTE — Progress Notes (Signed)
[facility] [address] [place] [address] Dept: [phone] Dept Fax: [phone]  Chronic Care Office Visit  Subjective:    Patient ID: Ana Santos, female    DOB: 27-Jun-1940, 81 y.o..   MRN: [id number]  Chief Complaint  Patient presents with   Follow-up    3 month f/u.  C/o having pain in hip and low back.      History of Present Illness:  Patient is in today for reassessment of chronic medical issues.  [name] has a history of hypertension, managed with amlodipine 5 mg daily. She also has hyperlipidemia, which is managed with pravastatin 40 mg daily.   Ms. Learn has a history of hypothyroidism. She is managed on levothyroxine 75 mcg daily.     [name] has a history of a fall in Sept. 2021. An MRI scan in may showed multi-level degenerative lumbar disease with foraminal stenoses. She had multiple ESIs without improvement. She had a left L1-L2 microdiscectomy on 02/28/2021. She had a lumbar fusion on 08/23/2021 due to recurrent disc herniation s/p microdiscectomy. She has now had further ESIs. She notes the last injection 10 days ago helped for 2 days, but then she was back in pain. She was referred to pain management. She tells me that she did recently try marijuana to see if this would help her pain. She states it made her feel like she was having a stroke, so she does not plan to continue. She does described intractable pain and is worried she is taking too much Tylenol.  Past Medical History: Patient Active Problem List   Diagnosis Date Noted   Oxygen desaturation 08/24/2021   Other secondary scoliosis, lumbar region 08/23/2021    Class: Chronic   Fusion of spine of lumbar region 08/23/2021   Lumbar post-laminectomy syndrome 06/15/2021   Status post lumbar microdiscectomy 03/09/2021   Recurrent herniation of lumbar disc 02/28/2021   Foraminal stenosis due to intervertebral disc disease 01/21/2021    History of bladder cancer 01/21/2021   Labyrinthitis 12/08/2019   Left-sided low back pain without sciatica 05/05/2019   Visual loss, transient, bilateral 02/21/2019   Essential hypertension 02/21/2019   Colonic mass s/p lap ileocectomy 03/14/2018 03/14/2018   Acute lower GI bleeding 03/14/2018   Chronic anticoagulation 03/14/2018   Chronic tension-type headache, not intractable 05/31/2015   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 ([place]) 03/11/2014   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   RLS (restless legs syndrome) 07/04/2012   History of cerebral aneurysm 03/26/2009   Vitamin D deficiency 08/10/2008   Venous insufficiency 08/10/2008   History of acoustic neuroma 08/18/2007   Hypothyroidism 08/18/2007   Tobacco use disorder, continuous 08/18/2007   Mixed hyperlipidemia 07/18/2007   Anxiety with depression 07/18/2007   COPD (chronic obstructive pulmonary disease) with chronic bronchitis ([place]) 07/18/2007   Fibromyalgia 07/18/2007   Past Surgical History:  Procedure Laterality Date   ANEURYSM COILING  04/2009   Dr. Estanislado Pandy   APPENDECTOMY     BACK SURGERY  02/28/2021   BREAST LUMPECTOMY Right 1986   benign   DIAGNOSTIC LAPAROSCOPY     Proable laparoscopic right colectomy 03-14-18 Dr. Excell Seltzer   EYE SURGERY Bilateral    ioc lens for cataracts   LAPAROSCOPIC RIGHT COLECTOMY Right 03/14/2018   Procedure: LAPAROSCOPIC ASSISTED  RIGHT COLECTOMY;  Surgeon: Excell Seltzer, MD;  [facility];  Service: General;  Laterality: Right;   LAPAROSCOPY N/A 03/14/2018   Procedure: LAPAROSCOPY;  Surgeon: Excell Seltzer, MD;  [facility];  Service: General;  Laterality: N/A;   LEFT HEART CATHETERIZATION WITH CORONARY ANGIOGRAM N/A 08/05/2014   Procedure: LEFT HEART CATHETERIZATION WITH CORONARY ANGIOGRAM;  Surgeon: Wellington Hampshire, MD;  Location: [facility]ATH LAB;  Service: Cardiovascular;  Laterality: N/A;   LUMBAR LAMINECTOMY N/A 02/28/2021    Procedure: LEFT L1-L2 MICRODISCECTOMY AND FASCIECTOMY;  Surgeon: Marybelle Killings, MD;  Location: [facility];  Service: Orthopedics;  Laterality: N/A;   TONSILLECTOMY     TOTAL ABDOMINAL HYSTERECTOMY  1970   complete   TRANSLABYRINTHINE PROCEDURE  2002   [facility] Dr. Vicie Mutters tumor removal   TRANSURETHRAL RESECTION OF BLADDER TUMOR N/A 02/08/2018   Procedure: TRANSURETHRAL RESECTION OF BLADDER TUMOR (TURBT)/ POSTOPERATIVE INSTILLATION OF CHEMO THERAPY;  Surgeon: Festus Aloe, MD;  Location: [facility];  Service: Urology;  Laterality: N/A;   Family History  Problem Relation Age of Onset   Heart disease Mother    Cancer Mother        Lung   Heart attack Mother    Diabetes Mother    Cancer Father        Lung   Diabetes Sister    Cancer Sister        unkown type   Diabetes Brother    Lung cancer Brother        Lung   AAA (abdominal aortic aneurysm) Neg Hx    Colon cancer Neg Hx    Stomach cancer Neg Hx    Esophageal cancer Neg Hx    Liver cancer Neg Hx    Pancreatic cancer Neg Hx    Rectal cancer Neg Hx    Outpatient Medications Prior to Visit  Medication Sig Dispense Refill   acetaminophen (TYLENOL) 500 MG tablet Take 500 mg by mouth every 6 (six) hours as needed for mild pain.     amLODipine (NORVASC) 5 MG tablet TAKE 1 TABLET DAILY 90 tablet 3   Cholecalciferol (VITAMIN D-3) 125 MCG (5000 UT) TABS Take 5,000 Units by mouth daily.     clopidogrel (PLAVIX) 75 MG tablet TAKE 1 TABLET DAILY 90 tablet 3   hydroxypropyl methylcellulose / hypromellose (ISOPTO TEARS / GONIOVISC) 2.5 % ophthalmic solution Place 1 drop into both eyes at bedtime.     levothyroxine (SYNTHROID) 75 MCG tablet Take 1 tablet (75 mcg total) by mouth daily. 90 tablet 3   pramipexole (MIRAPEX) 0.5 MG tablet Take one nightly one hour prior to going to bed as needed for restless leg syndrome. 90 tablet 4   pravastatin (PRAVACHOL) 40 MG tablet TAKE 1 TABLET DAILY 90 tablet 3   sertraline (ZOLOFT) 50 MG  tablet Take 1.5 tablets (75 mg total) by mouth daily. 135 tablet 3   Meloxicam 15 MG TBDP Take 1 tablet by mouth daily with breakfast. 90 tablet 3   naproxen (NAPROSYN) 500 MG tablet Take 1 tablet (500 mg total) by mouth 2 (two) times daily with a meal. 60 tablet 2   No facility-administered medications prior to visit.   Allergies  Allergen Reactions   Prednisone Other (See Comments)    Hallucinations   Atorvastatin Other (See Comments)     Lipitor caused arm pain (3/09)   Hydrocodone-Acetaminophen Other (See Comments)    hallucinations   Morphine Other (See Comments)    hallucinations   Nortriptyline Nausea And Vomiting    Caused nausea and vomiting and shaking, kept her up all night   Topamax [Topiramate] Nausea And Vomiting    *  dizziness*   Tramadol Other (See Comments)    withdrawal   Objective:   Today's Vitals   06/08/22 0753  BP: 126/68  Pulse: (!) 59  Temp: (!) 97 F (36.1 C)  TempSrc: Temporal  SpO2: 96%  Weight: 119 lb 6.4 oz (54.2 kg)  Height: 5' (1.524 m)   Body mass index is 23.32 kg/m.   General: Well developed, well nourished. No acute distress. Psych: Alert and oriented. Normal mood and affect.  Health Maintenance Due  Topic Date Due   DTaP/Tdap/Td (1 - Tdap) Never done   Zoster Vaccines- Shingrix (1 of 2) Never done   Medicare Annual Wellness (AWV)  01/28/2021     Assessment & Plan:   1. Essential hypertension Blood pressure is at goal. Continue amlodipine 5 mg daily.  2. Acquired hypothyroidism TSH has been at goal. Continue levothyroxine 75 mcg daily.  3. Lumbar post-laminectomy syndrome I reviewed notes from pain management. I do see that Ms. Polyak tox screen was positive for marijuana. This may have delayed them considering her for COTS. She has a follow-up with them in mid-January. I will provide her with tramadol to bridge until that appointment. I asked her to hold this to 1 tab daily.  - traMADol (ULTRAM) 50 MG tablet; Take 1  tablet (50 mg total) by mouth every 12 (twelve) hours as needed for up to 10 days.  Dispense: 20 tablet; Refill: 0   Return in about 3 months (around 09/07/2022) for Reassessment.   Haydee Salter, MD

## 2022-06-27 ENCOUNTER — Telehealth: Payer: Self-pay | Admitting: Physical Medicine & Rehabilitation

## 2022-06-27 ENCOUNTER — Encounter: Payer: Self-pay | Admitting: Physical Medicine & Rehabilitation

## 2022-06-27 ENCOUNTER — Encounter: Payer: Medicare Other | Attending: Physical Medicine & Rehabilitation | Admitting: Physical Medicine & Rehabilitation

## 2022-06-27 VITALS — BP 171/71 | HR 67 | Ht 60.0 in | Wt 117.8 lb

## 2022-06-27 DIAGNOSIS — G894 Chronic pain syndrome: Secondary | ICD-10-CM

## 2022-06-27 DIAGNOSIS — M961 Postlaminectomy syndrome, not elsewhere classified: Secondary | ICD-10-CM | POA: Diagnosis not present

## 2022-06-27 DIAGNOSIS — M533 Sacrococcygeal disorders, not elsewhere classified: Secondary | ICD-10-CM | POA: Diagnosis not present

## 2022-06-27 DIAGNOSIS — Z5181 Encounter for therapeutic drug level monitoring: Secondary | ICD-10-CM | POA: Diagnosis not present

## 2022-06-27 DIAGNOSIS — Z79891 Long term (current) use of opiate analgesic: Secondary | ICD-10-CM | POA: Insufficient documentation

## 2022-06-27 MED ORDER — HYDROCODONE-ACETAMINOPHEN 5-325 MG PO TABS: 1.0000 | ORAL_TABLET | Freq: Four times a day (QID) | ORAL | 0 refills | Status: AC | PRN

## 2022-06-27 MED ORDER — HYDROCODONE-ACETAMINOPHEN 5-325 MG PO TABS: 1.0000 | ORAL_TABLET | Freq: Four times a day (QID) | ORAL | 0 refills | Status: DC | PRN

## 2022-06-27 NOTE — Telephone Encounter (Signed)
Patient needs hydrocodone sent to [place].  She said it takes 3/5 days for [facility] to get her medication.  Any questions please call patient at [phone].

## 2022-06-27 NOTE — Progress Notes (Addendum)
Subjective:    Patient ID: Ana Santos, female    DOB: 07/20/1940, 82 y.o.   MRN: [id number]  HPI  HPI Ana Santos is a 82 y.o. year old female  who  has a past medical history of AN (acoustic neuroma) ([place]), Aneurysm ([place]), Anxiety, Bladder cancer ([place]), Cataract, Cigarette smoker, COPD (chronic obstructive pulmonary disease) ([place]), Coronary artery disease, Depression, Headache, Hyperlipidemia, Hypertension, Hypothyroidism, IBS (irritable bowel syndrome), Mitral valve prolapse, Osteopenia, Stroke ([place]), Venous insufficiency, and Vitamin D deficiency.   They are presenting to PM&R clinic as a new patient for pain management evaluation. They were referred for treatment of chronic lumbar and thoracic back pain.   She reports that she has had worsening pain for about 4 years.  No consistent pain shooting down her legs.  Pain is stabbing in quality.  Lifting anything makes it worse.  Denies any numbness or paresthesias in her limbs.  She had a prior left L1-2 hemilaminectomy and then later a L1-L2 TLIF in March 2023.  She is previously followed by Dr. Louanne Skye.  She is now being seen by Dr. Ileene Rubens orthopedic spine surgery.  She reports a history of bowel surgery with small intestine removed previously.   Red flag symptoms: Patient denies saddle anesthesia, loss of bowel or bladder continence, new weakness, new numbness/tingling, or pain waking up at nighttime.   Medications tried: Tylenol, advil, lidcaine patch, icy hot with mild benefit She is currently on Plavix which limits her medication options Hydrocodone 10 mg previously helped control her pain  tramadol-this did help her pain however she had very severe withdrawal issues when she did not get her medication on time, would like to avoid this medication if possible Oxycodone helped her pain previously   Other treatments: PT/OT  made it worse Heat pad helps 10-15 ESI injections- reports minimal benefit  L1-2 hemilaminectomy  and then later a L1-L2 TLIF in March 2023.  Without sustained improvement  Interval history Ana Santos is here for follow-up regarding her chronic pain.  She continues to have lumbar and thoracic back pain.  She says the pain has been very severe since medications have been discontinued.  She did get some tramadol at the end of December which provided mild benefit to her pain.  She denies any use of other substances or marijuana since her last visit.  She says she regrets having her back surgery done last year.   Pain Inventory Average Pain 10 Pain Right Now 10 My pain is sharp, stabbing, and aching  In the last 24 hours, has pain interfered with the following? General activity 7 Relation with others 7 Enjoyment of life 9 What TIME of day is your pain at its worst? morning  Sleep (in general) Fair  Pain is worse with: walking, sitting, standing, and some activites Pain improves with: rest and heat/ice Relief from Meds: 1  Family History  Problem Relation Age of Onset   Heart disease Mother    Cancer Mother        Lung   Heart attack Mother    Diabetes Mother    Cancer Father        Lung   Diabetes Sister    Cancer Sister        unkown type   Diabetes Brother    Lung cancer Brother        Lung   AAA (abdominal aortic aneurysm) Neg Hx    Colon cancer Neg Hx    Stomach  cancer Neg Hx    Esophageal cancer Neg Hx    Liver cancer Neg Hx    Pancreatic cancer Neg Hx    Rectal cancer Neg Hx    Social History   Socioeconomic History   Marital status: Married    Spouse name: Jori Moll   Number of children: 5   Years of education: Not on file   Highest education level: Not on file  Occupational History   Occupation: alterations   Occupation: Retired    Comment: Regulatory affairs officer  Tobacco Use   Smoking status: Every Day    Packs/day: 0.25    Years: 50.00    Total pack years: 12.50    Types: Cigarettes   Smokeless tobacco: Never   Tobacco comments:    4-5 cigs daily  is  aware she needs to quit  Vaping Use   Vaping Use: Never used  Substance and Sexual Activity   Alcohol use: No    Alcohol/week: 0.0 standard drinks of alcohol   Drug use: No   Sexual activity: Not Currently  Other Topics Concern   Not on file  Social History Narrative   Pt is married, 5 sons (4 living), 14 grandchildren, 4 great grandchildren.   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   Social Determinants of Radio broadcast assistant Strain: [place]  (01/29/2020)   Overall Financial Resource Strain (CARDIA)    Difficulty of Paying Living Expenses: Not hard at all  Food Insecurity: No [place] (02/14/2022)   Hunger Vital Sign    Worried About Running Out of Food in the Last Year: Never true    [place] in the Last Year: Never true  Transportation Needs: No Transportation Needs (02/14/2022)   PRAPARE - Hydrologist (Medical): No    Lack of Transportation (Non-Medical): No  Physical Activity: Inactive (01/29/2020)   Exercise Vital Sign    Days of Exercise per Week: 0 days    Minutes of Exercise per Session: 0 min  Stress: No Stress Concern Present (01/29/2020)   [place]    Feeling of Stress : Not at all  Social Connections: Moderately Isolated (01/29/2020)   Social Connection and Isolation Panel [NHANES]    Frequency of Communication with Friends and Family: More than three times a week    Frequency of Social Gatherings with Friends and Family: Once a week    Attends Religious Services: Never    Marine scientist or Organizations: No    Attends Archivist Meetings: Never    Marital Status: Married   Past Surgical History:  Procedure Laterality Date   ANEURYSM COILING  04/2009   Dr. Estanislado Pandy   APPENDECTOMY     BACK SURGERY  02/28/2021   BREAST LUMPECTOMY Right 1986   benign   DIAGNOSTIC LAPAROSCOPY     Proable  laparoscopic right colectomy 03-14-18 Dr. Excell Seltzer   EYE SURGERY Bilateral    ioc lens for cataracts   LAPAROSCOPIC RIGHT COLECTOMY Right 03/14/2018   Procedure: LAPAROSCOPIC ASSISTED  RIGHT COLECTOMY;  Surgeon: Excell Seltzer, MD;  Location: [facility];  Service: General;  Laterality: Right;   LAPAROSCOPY N/A 03/14/2018   Procedure: LAPAROSCOPY;  Surgeon: Excell Seltzer, MD;  Location: [facility];  Service: General;  Laterality: N/A;   LEFT HEART CATHETERIZATION WITH CORONARY ANGIOGRAM N/A 08/05/2014   Procedure: LEFT HEART CATHETERIZATION WITH CORONARY ANGIOGRAM;  Surgeon: Wellington Hampshire, MD;  Location: [facility];  Service: Cardiovascular;  Laterality: N/A;   LUMBAR LAMINECTOMY N/A 02/28/2021   Procedure: LEFT L1-L2 MICRODISCECTOMY AND FASCIECTOMY;  Surgeon: Marybelle Killings, MD;  Location: [facility];  Service: Orthopedics;  Laterality: N/A;   TONSILLECTOMY     TOTAL ABDOMINAL HYSTERECTOMY  1970   complete   TRANSLABYRINTHINE PROCEDURE  2002   [facility] Dr. Vicie Mutters tumor removal   TRANSURETHRAL RESECTION OF BLADDER TUMOR N/A 02/08/2018   Procedure: TRANSURETHRAL RESECTION OF BLADDER TUMOR (TURBT)/ POSTOPERATIVE INSTILLATION OF CHEMO THERAPY;  Surgeon: Festus Aloe, MD;  Location: [facility];  Service: Urology;  Laterality: N/A;   Past Surgical History:  Procedure Laterality Date   ANEURYSM COILING  04/2009   Dr. Estanislado Pandy   APPENDECTOMY     BACK SURGERY  02/28/2021   BREAST LUMPECTOMY Right 1986   benign   DIAGNOSTIC LAPAROSCOPY     Proable laparoscopic right colectomy 03-14-18 Dr. Excell Seltzer   EYE SURGERY Bilateral    ioc lens for cataracts   LAPAROSCOPIC RIGHT COLECTOMY Right 03/14/2018   Procedure: LAPAROSCOPIC ASSISTED  RIGHT COLECTOMY;  Surgeon: Excell Seltzer, MD;  Location: [facility];  Service: General;  Laterality: Right;   LAPAROSCOPY N/A 03/14/2018   Procedure: LAPAROSCOPY;  Surgeon: Excell Seltzer, MD;  [facility];  Service: General;  Laterality: N/A;    LEFT HEART CATHETERIZATION WITH CORONARY ANGIOGRAM N/A 08/05/2014   Procedure: LEFT HEART CATHETERIZATION WITH CORONARY ANGIOGRAM;  Surgeon: Wellington Hampshire, MD;  Location: [facility];  Service: Cardiovascular;  Laterality: N/A;   LUMBAR LAMINECTOMY N/A 02/28/2021   Procedure: LEFT L1-L2 MICRODISCECTOMY AND FASCIECTOMY;  Surgeon: Marybelle Killings, MD;  Location: [facility];  Service: Orthopedics;  Laterality: N/A;   TONSILLECTOMY     TOTAL ABDOMINAL HYSTERECTOMY  1970   complete   TRANSLABYRINTHINE PROCEDURE  2002   [facility] Dr. 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 denies   Depression    Headache    tension headaches due to aneurysm repair with stent   Hyperlipidemia    Hypertension    Hypothyroidism    IBS (irritable bowel syndrome)    Mitral valve prolapse    mild takes beta blocker   Osteopenia    Stroke ([place])    x 3 ministrokes last one feb 2014   Venous insufficiency    Vitamin D deficiency    BP (!) 171/71   Pulse 67   Ht 5' (1.524 m)   Wt 117 lb 12.8 oz (53.4 kg)   SpO2 99%   BMI 23.01 kg/m   Opioid Risk Score:   Fall Risk Score:  `1  Depression screen [facility] 2/9     05/26/2022    9:42 AM 02/14/2022   11:03 AM 12/01/2021  8:21 AM 08/03/2021    9:10 AM 05/03/2021    8:57 AM 11/29/2020    8:29 AM 01/29/2020    2:27 PM  Depression  screen PHQ 2/9  Decreased Interest 0 0 '1 2 1 '$ 0 0  Down, Depressed, Hopeless '3 1 3 3 2 '$ 0 0  PHQ - 2 Score '3 1 4 5 3 '$ 0 0  Altered sleeping 0  0 3 0    Tired, decreased energy '3  3 3 3    '$ Change in appetite '3  3 3 3    '$ Feeling bad or failure about yourself  3  1 0 0    Trouble concentrating 0  0 3 2    Moving slowly or fidgety/restless 0  0 0 0    Suicidal thoughts 0  0 2 0    PHQ-9 Score '12  11 19 11    '$ Difficult doing work/chores Somewhat difficult  Not difficult at all Not difficult at all Not difficult at all        Review of Systems  Musculoskeletal:  Positive for back pain.  All other systems reviewed and are negative.     Objective:   Physical Exam   Physical Exam Gen: no distress, normal appearing HEENT: oral mucosa pink and moist, NCAT Cardio: Reg rate Chest: normal effort, normal rate of breathing Abd: soft, non-distended Ext: no edema Psych: Tearful, reports due to severe pain. Skin: intact Neuro: Alert and awake, follows commands, cranial nerves II through XII grossly intact, speech and language normal Sensation to light touch in all 4 extremities Strength  5 out of 5 right lower extremity and hip flexion, knee extension, ankle PF and DF Strength 4+ out of 5 in left lower extremity hip flexion, knee extension, ankle PF and DF-she reports this is chronic since her prior surgery No ankle clonus Musculoskeletal: Slump negative Greatest tenderness to palpation left SI joint Mild T-spine paraspinal tenderness Facet loading negative   MRI T spine 05/01/22 FINDINGS: Alignment:  Physiologic.   Vertebrae: No acute fracture, evidence of discitis, or aggressive bone lesion. T7 vertebral body hemangioma noted.   Cord:  Normal signal and morphology.   Paraspinal and other soft tissues: No acute paraspinal abnormality.   Disc levels:   Disc spaces: Degenerative disease with disc height loss at T10-11 and T11-12. Degenerative disease with disc height loss at  T1-2. Partially visualized on the scout images is degenerative disease with disc height loss at C3-4, C4-5 and C5-6.   T1-T2: Mild broad-based disc bulge. Severe right and moderate left foraminal stenosis. No spinal stenosis.   T2-T3: No disc protrusion, foraminal stenosis or central canal stenosis.   T3-T4: No disc protrusion, foraminal stenosis or central canal stenosis.   T4-T5: No disc protrusion, foraminal stenosis or central canal stenosis.   T5-T6: No disc protrusion, foraminal stenosis or central canal stenosis.   T6-T7: No disc protrusion, foraminal stenosis or central canal stenosis.   T7-T8: No disc protrusion, foraminal stenosis or central canal stenosis.   T8-T9: No disc protrusion, foraminal stenosis or central canal stenosis.   T9-T10: No disc protrusion, foraminal stenosis or central canal stenosis.   T10-T11: Mild broad-based disc bulge. No foraminal or central canal stenosis. Right perineural cyst noted.   T11-T12: No disc protrusion, foraminal stenosis or central canal stenosis.   IMPRESSION: 1. At T1-2 there is a mild broad-based disc bulge. Severe right and moderate left foraminal stenosis. 2. No acute osseous injury of the thoracic spine.  L spine MRI 12/10/21 FINDINGS: Segmentation: Standard. Lowest well-formed disc space labeled the L5-S1 level.   Alignment: Up to 4 mm anterolisthesis of L5 on S1, stable. Trace anterolisthesis of L3 on L4, also relatively stable. Straightening of the normal lumbar lordosis elsewhere. Underlying sigmoid scoliosis.   Vertebrae: Susceptibility artifact from interval PLIF at L1-2. Vertebral body height maintained without acute or chronic fracture. Bone marrow signal intensity within normal limits. Few small benign hemangiomata noted. No worrisome osseous lesions. Mild reactive marrow edema noted about the partially visualized T10-11 interspace. Reactive edema present about the right L5-S1 facet due to  facet arthritis.   Conus medullaris and cauda equina: Conus extends to the L1-2 level. Conus and cauda equina appear normal.   Paraspinal and other soft tissues: Postoperative changes within the posterior paraspinous soft tissues without adverse features. Few scattered cysts noted about the partially visualized kidneys, similar to prior, likely benign. No follow-up imaging recommended.   Disc levels:   T11-12: Seen only on sagittal projection. Mild disc bulge with reactive endplate spurring. Left worse than right facet hypertrophy. No significant spinal stenosis. Foramina appear patent.   T12-L1: Chronic endplate Schmorl's node deformities without significant disc bulge. Mild right-sided facet hypertrophy. No significant stenosis.   L1-2: Interval PLIF. No residual or recurrent spinal stenosis. Foramina appear patent.   L2-3: Disc bulge with disc desiccation and intervertebral disc space narrowing. Disc bulging eccentric to the left. Reactive endplate spurring. Moderate left with mild right facet arthrosis. Resultant mild narrowing of the left lateral recess. Mild left L2 foraminal narrowing. Right neural foramen remains patent. Appearance is stable.   L3-4: Degenerative intervertebral disc space narrowing with diffuse disc bulge and disc desiccation. Disc bulging eccentric to the left with left-sided reactive endplate spurring. Resultant extraforaminal disc osteophyte contacts the exiting left L3 nerve root as it courses of the left neural foramen (series 101, image 20). Moderate left with mild right facet arthrosis. Resultant mild narrowing of the left lateral recess. Mild left L3 foraminal stenosis. Right neural foramen remains patent. Appearance is stable.   L4-5: Disc bulge with disc desiccation. Reactive endplate spurring. Mild to moderate facet hypertrophy. No more than mild narrowing of the lateral recesses bilaterally. Mild to moderate right with mild left L4  foraminal stenosis. Appearance is stable.   L5-S1: Anterolisthesis. Disc desiccation with broad posterior disc bulge. Severe right-sided facet arthrosis with reactive marrow edema. Moderate left-sided foraminal narrowing. No significant spinal stenosis. Mild right L5 foraminal stenosis. Appearance is similar.   IMPRESSION: 1. Interval PLIF at L1-2 without residual or recurrent stenosis. 2. Multilevel degenerative spondylosis elsewhere throughout the lumbar spine with resultant mild left lateral recess and foraminal stenosis at L2-3 and L3-4, and mild to moderate bilateral foraminal narrowing at L4-5 and L5-S1 as above. 3. Advanced right-sided facet arthrosis at L5-S1 with associated reactive marrow edema, which could serve as a source for lower back pain.  T-spine       Assessment & Plan:   Chronic lumbar and thoracic back pain and failed back syndrome, I also suspect she has some SI joint dysfunction on the left -Hx of  L1-L2 TLIF -UDS and pain agreement completed today -Will order short course of hydrocodone 5 mg due to severe pain -Consider continuation of hydrocodone at 5 mg dose after results of UDS complete -Patient has been scheduled to Dr. Letta Pate for left SI joint injection, she says this is in February -Pt has allergy noted to hydrocodone in chart-she insists this is not correct  and she does not have this allergy  THC use -Patient reports she has not used this since -Repeat UDS today  07/14/22, called regarding medication,  will order norco '5mg'$  q12h PRN #60, she reports good relief with this medication, UDS indicates decrease THC levels, called labs and they confirmed based on this this lower level this could be from her use of cannabis before our first visit Consider recheck UDS next visit

## 2022-06-27 NOTE — Addendum Note (Signed)
Addended by: Jennye Boroughs on: 06/27/2022 11:52 PM   Modules accepted: Orders

## 2022-06-28 NOTE — Telephone Encounter (Signed)
done

## 2022-06-29 LAB — TOXASSURE SELECT,+ANTIDEPR,UR

## 2022-07-07 ENCOUNTER — Ambulatory Visit: Payer: Medicare Other | Admitting: Physical Medicine & Rehabilitation

## 2022-07-13 ENCOUNTER — Telehealth: Payer: Self-pay

## 2022-07-13 NOTE — Telephone Encounter (Signed)
Patient is still having pain in hip and back. She states on the 16 th of January Hydrocodone was sent in for her but only a 4 day worth, she states that the Hydrocodone did help. She wants more sent in for her.

## 2022-07-14 MED ORDER — HYDROCODONE-ACETAMINOPHEN 5-325 MG PO TABS: 1.0000 | ORAL_TABLET | Freq: Two times a day (BID) | ORAL | 0 refills | Status: DC | PRN

## 2022-07-14 NOTE — Addendum Note (Signed)
Addended by: Jennye Boroughs on: 07/14/2022 04:23 PM   Modules accepted: Orders

## 2022-07-20 NOTE — Progress Notes (Signed)
  PROCEDURE RECORD [facility]   Name: Ana Santos DOB:10/17/1940 MRN: [id number]  Date:07/20/2022  Physician: Alysia Penna, MD    Nurse/CMA: Ana Loa MA  Allergies:  Allergies  Allergen Reactions   Prednisone Other (See Comments)    Hallucinations   Atorvastatin Other (See Comments)     Lipitor caused arm pain (3/09)   Hydrocodone-Acetaminophen Other (See Comments)    hallucinations   Morphine Other (See Comments)    hallucinations   Nortriptyline Nausea And Vomiting    Caused nausea and vomiting and shaking, kept her up all night   Topamax [Topiramate] Nausea And Vomiting    *dizziness*   Tramadol Other (See Comments)    withdrawal    Consent Signed: Yes.    Is patient diabetic? No.  CBG today? N/a  Pregnant: No. LMP: No LMP recorded. Patient has had a hysterectomy. (age 81-55)  Anticoagulants: yes (Plavix) Anti-inflammatory: no Antibiotics: no  Procedure: Sacroiliac Steroid Injection  Position: Prone Start Time: 12:37 PM  End Time: 12:41 PM     Fluoro Time: 23  RN/CMA Ana Bille MA Ana Alguire MA    Time 12:05 PM 12:45 PM    BP 158/70 153/66    Pulse 69 71    Respirations 16 16    O2 Sat 98 98    S/S 6 6    Pain Level 8/10 2/10     D/C home with Spouse, [name] & O X 3, D/C instructions reviewed, and sits independently.

## 2022-07-21 ENCOUNTER — Encounter: Payer: Medicare Other | Attending: Physical Medicine & Rehabilitation | Admitting: Physical Medicine & Rehabilitation

## 2022-07-21 ENCOUNTER — Encounter: Payer: Self-pay | Admitting: Physical Medicine & Rehabilitation

## 2022-07-21 VITALS — BP 158/70 | HR 70 | Temp 98.0°F | Ht 60.0 in | Wt 119.0 lb

## 2022-07-21 DIAGNOSIS — M47817 Spondylosis without myelopathy or radiculopathy, lumbosacral region: Secondary | ICD-10-CM

## 2022-07-21 MED ORDER — LIDOCAINE HCL 1 % IJ SOLN
5.0000 mL | Freq: Once | INTRAMUSCULAR | Status: AC
  Administered 2022-07-21: 5 mL

## 2022-07-21 MED ORDER — IOHEXOL 180 MG/ML  SOLN
1.0000 mL | Freq: Once | INTRAMUSCULAR | Status: AC
  Administered 2022-07-21: 1 mL

## 2022-07-21 MED ORDER — LIDOCAINE HCL (PF) 2 % IJ SOLN
1.0000 mL | Freq: Once | INTRAMUSCULAR | Status: AC
  Administered 2022-07-21: 1 mL

## 2022-07-21 MED ORDER — BETAMETHASONE SOD PHOS & ACET 6 (3-3) MG/ML IJ SUSP
3.0000 mg | Freq: Once | INTRAMUSCULAR | Status: AC
  Administered 2022-07-21: 3 mg via INTRAMUSCULAR

## 2022-07-21 NOTE — Progress Notes (Signed)
Left sacroiliac injection under fluoroscopic guidance  Indication: Left Low back and buttocks pain not relieved by medication management and other conservative care. Pt with hx of hallucinations after oral prednisone but tolerated ESI with dexamethasone on 02/23/22.  Informed consent was obtained after describing risks and benefits of the procedure with the patient, this includes bleeding, bruising, infection, paralysis and medication side effects. The patient wishes to proceed and has given written consent. The patient was placed in a prone position. The lumbar and sacral area was marked and prepped with Betadine. A 25-gauge 1-1/2 inch needle was inserted into the skin and subcutaneous tissue and 1 mL of 1% lidocaine was injected. Then a 25-gauge 3 inch spinal needle was inserted under fluoroscopic guidance into the left sacroiliac joint. AP and lateral images were utilized. Isovue 200x0.5 mL under live fluoroscopy demonstrated no intravascular uptake but inferior capsule leak of contrast. Then a solution containing one ML of 6 mg per mLbetamethasone and 2 ML of 2% lidocaine MPF was injected x1.5 mL. Patient tolerated the procedure well. Post procedure instructions were given. Please see post procedure form.

## 2022-07-21 NOTE — Patient Instructions (Signed)
Sacroiliac injection was performed today. A combination of numbing medicine (lidocaine) plus a cortisone medicine (betamethasone) was injected. The injection was done under x-ray guidance. This procedure has been performed to help reduce low back and buttocks pain as well as potentially hip pain. The duration of this injection is variable lasting from hours to  Months. It may repeated if needed. 

## 2022-07-25 ENCOUNTER — Other Ambulatory Visit: Payer: Self-pay | Admitting: Family Medicine

## 2022-07-25 DIAGNOSIS — I10 Essential (primary) hypertension: Secondary | ICD-10-CM

## 2022-07-31 ENCOUNTER — Ambulatory Visit (INDEPENDENT_AMBULATORY_CARE_PROVIDER_SITE_OTHER): Payer: Medicare Other

## 2022-07-31 VITALS — Ht 62.0 in | Wt 116.0 lb

## 2022-07-31 DIAGNOSIS — Z0000 Encounter for general adult medical examination without abnormal findings: Secondary | ICD-10-CM | POA: Diagnosis not present

## 2022-07-31 NOTE — Patient Instructions (Signed)
Ana Santos , Thank you for taking time to come for your Medicare Wellness Visit. I appreciate your ongoing commitment to your health goals. Please review the following plan we discussed and let me know if I can assist you in the future.   These are the goals we discussed:  Goals      Patient Stated     07/31/2022, wants to get pain better        This is a list of the screening recommended for you and due dates:  Health Maintenance  Topic Date Due   DTaP/Tdap/Td vaccine (1 - Tdap) Never done   Zoster (Shingles) Vaccine (1 of 2) Never done   Medicare Annual Wellness Visit  08/01/2023   Pneumonia Vaccine  Completed   Flu Shot  Completed   DEXA scan (bone density measurement)  Completed   HPV Vaccine  Aged Out   COVID-19 Vaccine  Discontinued    Advanced directives: Advance directive discussed with you today.   Conditions/risks identified: smoking  Next appointment: Follow up in one year for your annual wellness visit    Preventive Care 99 Years and Older, Female Preventive care refers to lifestyle choices and visits with your health care provider that can promote health and wellness. What does preventive care include? A yearly physical exam. This is also called an annual well check. Dental exams once or twice a year. Routine eye exams. Ask your health care provider how often you should have your eyes checked. Personal lifestyle choices, including: Daily care of your teeth and gums. Regular physical activity. Eating a healthy diet. Avoiding tobacco and drug use. Limiting alcohol use. Practicing safe sex. Taking low-dose aspirin every day. Taking vitamin and mineral supplements as recommended by your health care provider. What happens during an annual well check? The services and screenings done by your health care provider during your annual well check will depend on your age, overall health, lifestyle risk factors, and family history of disease. Counseling  Your health  care provider may ask you questions about your: Alcohol use. Tobacco use. Drug use. Emotional well-being. Home and relationship well-being. Sexual activity. Eating habits. History of falls. Memory and ability to understand (cognition). Work and work Statistician. Reproductive health. Screening  You may have the following tests or measurements: Height, weight, and BMI. Blood pressure. Lipid and cholesterol levels. These may be checked every 5 years, or more frequently if you are over 17 years old. Skin check. Lung cancer screening. You may have this screening every year starting at age 11 if you have a 30-pack-year history of smoking and currently smoke or have quit within the past 15 years. Fecal occult blood test (FOBT) of the stool. You may have this test every year starting at age 71. Flexible sigmoidoscopy or colonoscopy. You may have a sigmoidoscopy every 5 years or a colonoscopy every 10 years starting at age 10. Hepatitis C blood test. Hepatitis B blood test. Sexually transmitted disease (STD) testing. Diabetes screening. This is done by checking your blood sugar (glucose) after you have not eaten for a while (fasting). You may have this done every 1-3 years. Bone density scan. This is done to screen for osteoporosis. You may have this done starting at age 43. Mammogram. This may be done every 1-2 years. Talk to your health care provider about how often you should have regular mammograms. Talk with your health care provider about your test results, treatment options, and if necessary, the need for more tests. Vaccines  Your health care provider may recommend certain vaccines, such as: Influenza vaccine. This is recommended every year. Tetanus, diphtheria, and acellular pertussis (Tdap, Td) vaccine. You may need a Td booster every 10 years. Zoster vaccine. You may need this after age 65. Pneumococcal 13-valent conjugate (PCV13) vaccine. One dose is recommended after age  66. Pneumococcal polysaccharide (PPSV23) vaccine. One dose is recommended after age 12. Talk to your health care provider about which screenings and vaccines you need and how often you need them. This information is not intended to replace advice given to you by your health care provider. Make sure you discuss any questions you have with your health care provider. Document Released: 06/25/2015 Document Revised: 02/16/2016 Document Reviewed: 03/30/2015 Elsevier Interactive Patient Education  2017 [place] Prevention in the Home Falls can cause injuries. They can happen to people of all ages. There are many things you can do to make your home safe and to help prevent falls. What can I do on the outside of my home? Regularly fix the edges of walkways and driveways and fix any cracks. Remove anything that might make you trip as you walk through a door, such as a raised step or threshold. Trim any bushes or trees on the path to your home. Use bright outdoor lighting. Clear any walking paths of anything that might make someone trip, such as rocks or tools. Regularly check to see if handrails are loose or broken. Make sure that both sides of any steps have handrails. Any raised decks and porches should have guardrails on the edges. Have any leaves, snow, or ice cleared regularly. Use sand or salt on walking paths during winter. Clean up any spills in your garage right away. This includes oil or grease spills. What can I do in the bathroom? Use night lights. Install grab bars by the toilet and in the tub and shower. Do not use towel bars as grab bars. Use non-skid mats or decals in the tub or shower. If you need to sit down in the shower, use a plastic, non-slip stool. Keep the floor dry. Clean up any water that spills on the floor as soon as it happens. Remove soap buildup in the tub or shower regularly. Attach bath mats securely with double-sided non-slip rug tape. Do not have throw  rugs and other things on the floor that can make you trip. What can I do in the bedroom? Use night lights. Make sure that you have a light by your bed that is easy to reach. Do not use any sheets or blankets that are too big for your bed. They should not hang down onto the floor. Have a firm chair that has side arms. You can use this for support while you get dressed. Do not have throw rugs and other things on the floor that can make you trip. What can I do in the kitchen? Clean up any spills right away. Avoid walking on wet floors. Keep items that you use a lot in easy-to-reach places. If you need to reach something above you, use a strong step stool that has a grab bar. Keep electrical cords out of the way. Do not use floor polish or wax that makes floors slippery. If you must use wax, use non-skid floor wax. Do not have throw rugs and other things on the floor that can make you trip. What can I do with my stairs? Do not leave any items on the stairs. Make sure that there are  handrails on both sides of the stairs and use them. Fix handrails that are broken or loose. Make sure that handrails are as long as the stairways. Check any carpeting to make sure that it is firmly attached to the stairs. Fix any carpet that is loose or worn. Avoid having throw rugs at the top or bottom of the stairs. If you do have throw rugs, attach them to the floor with carpet tape. Make sure that you have a light switch at the top of the stairs and the bottom of the stairs. If you do not have them, ask someone to add them for you. What else can I do to help prevent falls? Wear shoes that: Do not have high heels. Have rubber bottoms. Are comfortable and fit you well. Are closed at the toe. Do not wear sandals. If you use a stepladder: Make sure that it is fully opened. Do not climb a closed stepladder. Make sure that both sides of the stepladder are locked into place. Ask someone to hold it for you, if  possible. Clearly mark and make sure that you can see: Any grab bars or handrails. First and last steps. Where the edge of each step is. Use tools that help you move around (mobility aids) if they are needed. These include: Canes. Walkers. Scooters. Crutches. Turn on the lights when you go into a dark area. Replace any light bulbs as soon as they burn out. Set up your furniture so you have a clear path. Avoid moving your furniture around. If any of your floors are uneven, fix them. If there are any pets around you, be aware of where they are. Review your medicines with your doctor. Some medicines can make you feel dizzy. This can increase your chance of falling. Ask your doctor what other things that you can do to help prevent falls. This information is not intended to replace advice given to you by your health care provider. Make sure you discuss any questions you have with your health care provider. Document Released: 03/25/2009 Document Revised: 11/04/2015 Document Reviewed: 07/03/2014 Elsevier Interactive Patient Education  2017 [employer].

## 2022-07-31 NOTE — Progress Notes (Signed)
I connected with  Ana Santos on 07/31/22 by a audio enabled telemedicine application and verified that I am speaking with the correct person using two identifiers.  Patient Location: Home  Provider Location: Office/Clinic  I discussed the limitations of evaluation and management by telemedicine. The patient expressed understanding and agreed to proceed.  Subjective:   Ana Santos is a 82 y.o. female who presents for Medicare Annual (Subsequent) preventive examination.  Review of Systems     Cardiac Risk Factors include: advanced age (>19mn, >>42women);dyslipidemia;hypertension;smoking/ tobacco exposure     Objective:    Today's Vitals   07/31/22 1401 07/31/22 1402  Weight: 116 lb (52.6 kg)   Height: 5' 2"$  (1.575 m)   PainSc:  9    Body mass index is 21.22 kg/m.     07/31/2022    2:06 PM 08/15/2021    8:19 AM 02/25/2021    8:12 AM 01/29/2020    2:24 PM 03/22/2018    5:03 PM 03/14/2018    5:52 AM 03/13/2018    9:05 AM  Advanced Directives  Does Patient Have a Medical Advance Directive? No No No No No No No  Would patient like information on creating a medical advance directive?  No - Patient declined No - Patient declined Yes (MAU/Ambulatory/Procedural Areas - Information given) No - Patient declined No - Patient declined No - Patient declined    Current Medications (verified) Outpatient Encounter Medications as of 07/31/2022  Medication Sig   amLODipine (NORVASC) 5 MG tablet TAKE 1 TABLET DAILY   Cholecalciferol (VITAMIN D-3) 125 MCG (5000 UT) TABS Take 5,000 Units by mouth daily.   clopidogrel (PLAVIX) 75 MG tablet TAKE 1 TABLET DAILY   HYDROcodone-acetaminophen (NORCO/VICODIN) 5-325 MG tablet Take 1 tablet by mouth every 12 (twelve) hours as needed for moderate pain.   hydroxypropyl methylcellulose / hypromellose (ISOPTO TEARS / GONIOVISC) 2.5 % ophthalmic solution Place 1 drop into both eyes at bedtime.   levothyroxine (SYNTHROID) 75 MCG tablet Take 1 tablet  (75 mcg total) by mouth daily.   pramipexole (MIRAPEX) 0.5 MG tablet Take one nightly one hour prior to going to bed as needed for restless leg syndrome.   pravastatin (PRAVACHOL) 40 MG tablet TAKE 1 TABLET DAILY   sertraline (ZOLOFT) 50 MG tablet Take 1.5 tablets (75 mg total) by mouth daily.   traMADol (ULTRAM) 50 MG tablet  (Patient not taking: Reported on 07/31/2022)   No facility-administered encounter medications on file as of 07/31/2022.    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 denies   Depression    Headache    tension headaches due to aneurysm repair with stent   Hyperlipidemia    Hypertension    Hypothyroidism    IBS (irritable bowel syndrome)    Mitral valve prolapse    mild takes beta blocker   Osteopenia    [place]    x 3 ministrokes last one feb 2014   Venous insufficiency  Vitamin D deficiency    Past Surgical History:  Procedure Laterality Date   ANEURYSM COILING  04/2009   Dr. Estanislado Pandy   APPENDECTOMY     BACK SURGERY  02/28/2021   BACK SURGERY     08/2021   BREAST LUMPECTOMY Right 1986   benign   DIAGNOSTIC LAPAROSCOPY     Proable laparoscopic right colectomy 03-14-18 Dr. Excell Seltzer   EYE SURGERY Bilateral    ioc lens for cataracts   LAPAROSCOPIC RIGHT COLECTOMY Right 03/14/2018   Procedure: LAPAROSCOPIC ASSISTED  RIGHT COLECTOMY;  Surgeon: Excell Seltzer, MD;  Location: [facility];  Service: General;  Laterality: Right;   LAPAROSCOPY N/A 03/14/2018   Procedure:  LAPAROSCOPY;  Surgeon: Excell Seltzer, MD;  Location: [facility];  Service: General;  Laterality: N/A;   LEFT HEART CATHETERIZATION WITH CORONARY ANGIOGRAM N/A 08/05/2014   Procedure: LEFT HEART CATHETERIZATION WITH CORONARY ANGIOGRAM;  Surgeon: Wellington Hampshire, MD;  Location: [facility];  Service: Cardiovascular;  Laterality: N/A;   LUMBAR LAMINECTOMY N/A 02/28/2021   Procedure: LEFT L1-L2 MICRODISCECTOMY AND FASCIECTOMY;  Surgeon: Marybelle Killings, MD;  Location: [facility];  Service: Orthopedics;  Laterality: N/A;   TONSILLECTOMY     TOTAL ABDOMINAL HYSTERECTOMY  1970   complete   TRANSLABYRINTHINE PROCEDURE  2002   [facility] Dr. Vicie Mutters tumor removal   TRANSURETHRAL RESECTION OF BLADDER TUMOR N/A 02/08/2018   Procedure: TRANSURETHRAL RESECTION OF BLADDER TUMOR (TURBT)/ POSTOPERATIVE INSTILLATION OF CHEMO THERAPY;  Surgeon: Festus Aloe, MD;  Location: [facility];  Service: Urology;  Laterality: N/A;   Family History  Problem Relation Age of Onset   Heart disease Mother    Cancer Mother        Lung   Heart attack Mother    Diabetes Mother    Cancer Father        Lung   Diabetes Sister    Cancer Sister        unkown type   Diabetes Brother    Lung cancer Brother        Lung   AAA (abdominal aortic aneurysm) Neg Hx    Colon cancer Neg Hx    Stomach cancer Neg Hx    Esophageal cancer Neg Hx    Liver cancer Neg Hx    Pancreatic cancer Neg Hx    Rectal cancer Neg Hx    Social History   Socioeconomic History   Marital status: Married    Spouse name: Jori Moll   Number of children: 5   Years of education: Not on file   Highest education level: Not on file  Occupational History   Occupation: alterations   Occupation: Retired    Comment: Regulatory affairs officer  Tobacco Use   Smoking status: Every Day    Packs/day: 0.25    Years: 50.00    Total pack years: 12.50    Types: Cigarettes   Smokeless tobacco: Never   Tobacco comments:    4-5 cigs daily  is aware she needs to quit   Vaping Use   Vaping Use: Never used  Substance and Sexual Activity   Alcohol use: No    Alcohol/week: 0.0 standard drinks of alcohol   Drug use: Yes    Types: Hydrocodone   Sexual activity: Not Currently  Other Topics Concern   Not on file  Social History Narrative   Pt is married, 5 sons (4 living), 14 grandchildren, 4 great grandchildren.   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   Social Determinants of Health   Financial Resource Strain: Low Risk  (07/31/2022)   Overall Financial Resource Strain (CARDIA)    Difficulty of Paying Living Expenses: Not hard at all  Food Insecurity: No Food Insecurity (07/31/2022)   Hunger Vital Sign    Worried About Running Out of Food in the Last Year: Never true    Ran Out of Food in the Last Year: Never true  Transportation Needs: No Transportation Needs (07/31/2022)   PRAPARE - Hydrologist (Medical): No    Lack of Transportation (Non-Medical): No  Physical Activity: Inactive (07/31/2022)   Exercise Vital Sign    Days of Exercise per Week: 0 days    Minutes of Exercise per Session: 0 min  Stress: No Stress Concern Present (07/31/2022)   [place]    Feeling of Stress : Not at all  Social Connections: Moderately Isolated (01/29/2020)   Social Connection and Isolation Panel [NHANES]    Frequency of Communication with Friends and Family: More than three times a week    Frequency of Social Gatherings with Friends and Family: Once a week    Attends Religious Services: Never    Marine scientist or Organizations: No    Attends Music therapist: Never    Marital Status: Married    Tobacco Counseling Ready to quit: Not Answered Counseling given: Not Answered Tobacco comments: 4-5 cigs daily  is aware she needs to quit   Clinical Intake:  Pre-visit preparation completed: Yes  Pain :  0-10 Pain Score: 9  Pain Type: Chronic pain Pain Location: Back Pain Orientation: Upper, Mid, Lower Pain Descriptors / Indicators: Aching Pain Onset: More than a month ago Pain Frequency: Constant     Nutritional Status: BMI of 19-24  Normal Nutritional Risks: Nausea/ vomitting/ diarrhea (diarrhea constantly) Diabetes: No  How often do you need to have someone help you when you read instructions, pamphlets, or other written materials from your doctor or pharmacy?: 1 - Never  Diabetic? no  Interpreter Needed?: No  Information entered by :: NAllen LPN   Activities of Daily Living    07/31/2022    2:07 PM 08/15/2021    8:21 AM  In your present state of health, do you have any difficulty performing the following activities:  Hearing? 1   Comment deaf in right ear   Vision? 0   Difficulty concentrating or making decisions? 0   Walking or climbing stairs? 0   Dressing or bathing? 0   Doing errands, shopping? 0 0  Preparing Food and eating ? N   Using the Toilet? N   In the past six months, have you accidently leaked urine? Y   Do you have problems with loss of bowel control? N   Managing your Medications? N   Managing your Finances? N   Housekeeping or managing your Housekeeping? N     Patient Care Team: Haydee Salter, MD as PCP - General (Family Medicine) Marybelle Killings, MD as Consulting Physician (Orthopedic Surgery) Magnus Sinning, MD as Consulting Physician (Physical Medicine and Rehabilitation) Creig Hines, MD as Consulting Physician (Orthopedic Surgery) Jessy Oto, MD (Inactive) as Consulting Physician (Orthopedic Surgery)  Indicate any recent Medical Services you may have received from other than [facility] providers in the past year (date may be approximate).     Assessment:   This is a routine wellness examination for [place].  Hearing/Vision screen Vision Screening - Comments:: No regular eye exams  Dietary issues and exercise activities  discussed: Current Exercise Habits: The patient does not participate in regular exercise at present   Goals Addressed             This Visit's Progress    Patient Stated       07/31/2022, wants to get pain better       Depression Screen    07/31/2022    2:07 PM 07/21/2022   12:10 PM 05/26/2022    9:42 AM 02/14/2022   11:03 AM 12/01/2021    8:21 AM 08/03/2021    9:10 AM 05/03/2021    8:57 AM  PHQ 2/9 Scores  PHQ - 2 Score 0 2 3 1 4 5 3  $ PHQ- 9 Score   12  11 19 11    $ Fall Risk    07/31/2022    2:07 PM 07/21/2022   12:10 PM 06/27/2022    9:52 AM 05/26/2022    9:42 AM 12/01/2021    8:18 AM  Fall Risk   Falls in the past year? 0 0 0 0 0  Number falls in past yr: 0 0   0  Injury with Fall? 0 0   0  Risk for fall due to : Medication side effect   Impaired balance/gait No Fall Risks  Follow up Falls prevention discussed;Education provided;Falls evaluation completed    Falls evaluation completed    FALL RISK PREVENTION PERTAINING TO THE HOME:  Any stairs in or around the home? Yes  If so, are there any without handrails? No  Home free of loose throw rugs in walkways, pet beds, electrical cords, etc? Yes  Adequate lighting in your home to reduce risk of falls? Yes   ASSISTIVE DEVICES UTILIZED TO PREVENT FALLS:  Life alert? No  Use of a cane, walker or w/c? No  Grab bars in the bathroom? Yes  Shower chair or bench in shower? No  Elevated toilet seat or a handicapped toilet? Yes   TIMED UP AND GO:  Was the test performed? No .      Cognitive Function:        07/31/2022    2:08 PM  6CIT Screen  What Year? 0 points  What month? 0 points  What time? 0 points  Count back from 20 0 points  Months in reverse 4 points  Repeat phrase 6 points  Total Score 10 points    Immunizations Immunization History  Administered Date(s) Administered   Fluad Quad(high Dose 65+) 03/04/2019, 02/27/2020, 03/03/2022   Influenza Split 06/09/2011, 03/29/2012, 03/12/2018    Influenza Whole 03/12/2009, 03/29/2010   Influenza, High Dose Seasonal PF 03/17/2016, 04/25/2017   Influenza,inj,Quad PF,6+ Mos 05/22/2013, 04/17/2014, 04/22/2015   Influenza-Unspecified 04/12/2018, 03/30/2021   PFIZER(Purple Top)SARS-COV-2 Vaccination 06/28/2019, 07/19/2019, 04/10/2020   Pneumococcal Conjugate-13 10/23/2016   Pneumococcal Polysaccharide-23 12/18/2007    TDAP status: Due, Education has been provided regarding the importance of this vaccine. Advised may receive this vaccine at local pharmacy or Health Dept. Aware to provide a copy of the vaccination record if obtained from local pharmacy or Health Dept. Verbalized acceptance and understanding.  Flu Vaccine status: Up to date  Pneumococcal vaccine status: Up to date  Covid-19 vaccine status: Completed vaccines  Qualifies for Shingles Vaccine? Yes   Zostavax completed No   Shingrix Completed?: No.    Education has been provided regarding the importance of this vaccine. Patient has been advised to call insurance  company to determine out of pocket expense if they have not yet received this vaccine. Advised may also receive vaccine at local pharmacy or Health Dept. Verbalized acceptance and understanding.  Screening Tests Health Maintenance  Topic Date Due   DTaP/Tdap/Td (1 - Tdap) Never done   Zoster Vaccines- Shingrix (1 of 2) Never done   Medicare Annual Wellness (AWV)  08/01/2023   Pneumonia Vaccine 44+ Years old  Completed   INFLUENZA VACCINE  Completed   DEXA SCAN  Completed   HPV VACCINES  Aged Out   COVID-19 Vaccine  Discontinued    Health Maintenance  Health Maintenance Due  Topic Date Due   DTaP/Tdap/Td (1 - Tdap) Never done   Zoster Vaccines- Shingrix (1 of 2) Never done    Colorectal cancer screening: No longer required.   Mammogram status: No longer required due to age.  Bone Density status: Completed 07/15/2013.   Lung Cancer Screening: (Low Dose CT Chest recommended if Age 54-80 years, 30  pack-year currently smoking OR have quit w/in 15years.) does not qualify.   Lung Cancer Screening Referral: no  Additional Screening:  Hepatitis C Screening: does not qualify;   Vision Screening: Recommended annual ophthalmology exams for early detection of glaucoma and other disorders of the eye. Is the patient up to date with their annual eye exam?  No  Who is the provider or what is the name of the office in which the patient attends annual eye exams? none If pt is not established with a provider, would they like to be referred to a provider to establish care? No .   Dental Screening: Recommended annual dental exams for proper oral hygiene  Community Resource Referral / Chronic Care Management: CRR required this visit?  No   CCM required this visit?  No      Plan:     I have personally reviewed and noted the following in the patient's chart:   Medical and social history Use of alcohol, tobacco or illicit drugs  Current medications and supplements including opioid prescriptions. Patient is not currently taking opioid prescriptions. Functional ability and status Nutritional status Physical activity Advanced directives List of other physicians Hospitalizations, surgeries, and ER visits in previous 12 months Vitals Screenings to include cognitive, depression, and falls Referrals and appointments  In addition, I have reviewed and discussed with patient certain preventive protocols, quality metrics, and best practice recommendations. A written personalized care plan for preventive services as well as general preventive health recommendations were provided to patient.     Kellie Simmering, LPN   [id number]   Nurse Notes: none  Due to this being a virtual visit, the after visit summary with patients personalized plan was offered to patient via mail or my-chart. to pick up at office at next visit

## 2022-08-11 ENCOUNTER — Other Ambulatory Visit: Payer: Self-pay | Admitting: *Deleted

## 2022-08-11 MED ORDER — HYDROCODONE-ACETAMINOPHEN 5-325 MG PO TABS: 1.0000 | ORAL_TABLET | Freq: Two times a day (BID) | ORAL | 0 refills | Status: DC | PRN

## 2022-08-11 NOTE — Telephone Encounter (Signed)
Patient request refill be sent to [facility]. Last fill 07/14/22 #60  1 tab every 12 hours prn.

## 2022-08-14 NOTE — Telephone Encounter (Signed)
Nothing further needed . 

## 2022-08-29 ENCOUNTER — Encounter: Payer: Medicare Other | Attending: Physical Medicine & Rehabilitation | Admitting: Physical Medicine & Rehabilitation

## 2022-08-29 ENCOUNTER — Encounter: Payer: Self-pay | Admitting: Physical Medicine & Rehabilitation

## 2022-08-29 VITALS — BP 146/71 | HR 63 | Ht 62.0 in | Wt 120.0 lb

## 2022-08-29 DIAGNOSIS — Z5181 Encounter for therapeutic drug level monitoring: Secondary | ICD-10-CM | POA: Diagnosis not present

## 2022-08-29 DIAGNOSIS — Z79891 Long term (current) use of opiate analgesic: Secondary | ICD-10-CM | POA: Diagnosis not present

## 2022-08-29 DIAGNOSIS — G894 Chronic pain syndrome: Secondary | ICD-10-CM | POA: Insufficient documentation

## 2022-08-29 DIAGNOSIS — M961 Postlaminectomy syndrome, not elsewhere classified: Secondary | ICD-10-CM | POA: Diagnosis not present

## 2022-08-29 DIAGNOSIS — M47817 Spondylosis without myelopathy or radiculopathy, lumbosacral region: Secondary | ICD-10-CM | POA: Diagnosis not present

## 2022-08-29 DIAGNOSIS — M533 Sacrococcygeal disorders, not elsewhere classified: Secondary | ICD-10-CM | POA: Diagnosis not present

## 2022-08-29 MED ORDER — HYDROCODONE-ACETAMINOPHEN 5-325 MG PO TABS: 1.0000 | ORAL_TABLET | Freq: Three times a day (TID) | ORAL | 0 refills | Status: AC | PRN

## 2022-08-29 NOTE — Progress Notes (Signed)
Subjective:    Patient ID: Ana Santos, female    DOB: [birth year], 82 y.o.   MRN: [id number]  HPI  HPI Ana Santos is a 81 y.o. year old female  who  has a past medical history of AN (acoustic neuroma) ([place]), Aneurysm ([place]), Anxiety, Bladder cancer ([place]), Cataract, Cigarette smoker, COPD (chronic obstructive pulmonary disease) ([place]), Coronary artery disease, Depression, Headache, Hyperlipidemia, Hypertension, Hypothyroidism, IBS (irritable bowel syndrome), Mitral valve prolapse, Osteopenia, Stroke ([place]), Venous insufficiency, and Vitamin D deficiency.   They are presenting to PM&R clinic as a new patient for pain management evaluation. They were referred for treatment of chronic lumbar and thoracic back pain.   She reports that she has had worsening pain for about 4 years.  No consistent pain shooting down her legs.  Pain is stabbing in quality.  Lifting anything makes it worse.  Denies any numbness or paresthesias in her limbs.  She had a prior left L1-2 hemilaminectomy and then later a L1-L2 TLIF in March 2023.  She is previously followed by Dr. Louanne Skye.  She is now being seen by Dr. Ileene Rubens orthopedic spine surgery.  She reports a history of bowel surgery with small intestine removed previously.   Red flag symptoms: Patient denies saddle anesthesia, loss of bowel or bladder continence, new weakness, new numbness/tingling, or pain waking up at nighttime.   Medications tried: Tylenol, advil, lidcaine patch, icy hot with mild benefit She is currently on Plavix which limits her medication options Hydrocodone 10 mg previously helped control her pain  tramadol-this did help her pain however she had very severe withdrawal issues when she did not get her medication on time, would like to avoid this medication if possible Oxycodone helped her pain previously   Other treatments: PT/OT  made it worse Heat pad helps 10-15 ESI injections- reports minimal benefit  L1-2 hemilaminectomy  and then later a L1-L2 TLIF in March 2023.  Without sustained improvement   Interval history 06/27/2022 Ana Santos is here for follow-up regarding her chronic pain.  She continues to have lumbar and thoracic back pain.  She says the pain has been very severe since medications have been discontinued.  She did get some tramadol at the end of December which provided mild benefit to her pain.  She denies any use of other substances or marijuana since her last visit.  She says she regrets having her back surgery done last year.   Interval history 08/29/2022 Ana Santos is here for follow-up regarding her chronic back pain primarily in her lumbar and thoracic back.  She also recently had a left SI joint injection by Dr. Letta Pate.  This provided short-term pain relief however the pain returned after a day or two.  Hydrocodone 5 mg has been helping her pain and makes it more tolerable.  She is not having any side effects of this medication.  She does report that hydrocodone does not last long enough and this leaves her in significant pain throughout the day.  Pain Inventory Average Pain 10 Pain Right Now 10 My pain is stabbing and aching  In the last 24 hours, has pain interfered with the following? General activity 7 Relation with others 0 Enjoyment of life 4 What TIME of day is your pain at its worst? morning  Sleep (in general) Fair  Pain is worse with: walking, bending, standing, and some activites Pain improves with: heat/ice and medication Relief from Meds: 5  Family History  Problem Relation Age of  Onset   Heart disease Mother    Cancer Mother        Lung   Heart attack Mother    Diabetes Mother    Cancer Father        Lung   Diabetes Sister    Cancer Sister        unkown type   Diabetes Brother    Lung cancer Brother        Lung   AAA (abdominal aortic aneurysm) Neg Hx    Colon cancer Neg Hx    Stomach cancer Neg Hx    Esophageal cancer Neg Hx    Liver cancer Neg Hx     Pancreatic cancer Neg Hx    Rectal cancer Neg Hx    Social History   Socioeconomic History   Marital status: Married    Spouse name: Jori Moll   Number of children: 5   Years of education: Not on file   Highest education level: Not on file  Occupational History   Occupation: alterations   Occupation: Retired    Comment: Regulatory affairs officer  Tobacco Use   Smoking status: Every Day    Packs/day: 0.25    Years: 50.00    Additional pack years: 0.00    Total pack years: 12.50    Types: Cigarettes   Smokeless tobacco: Never   Tobacco comments:    4-5 cigs daily  is aware she needs to quit  Vaping Use   Vaping Use: Never used  Substance and Sexual Activity   Alcohol use: No    Alcohol/week: 0.0 standard drinks of alcohol   Drug use: Yes    Types: Hydrocodone   Sexual activity: Not Currently  Other Topics Concern   Not on file  Social History Narrative   Pt is married, 5 sons (4 living), 14 grandchildren, 4 great grandchildren.   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   Social Determinants of Radio broadcast assistant Strain: [place]  (07/31/2022)   Overall Financial Resource Strain (CARDIA)    Difficulty of Paying Living Expenses: Not hard at all  Food Insecurity: No Food Insecurity (07/31/2022)   Hunger Vital Sign    Worried About Running Out of Food in the Last Year: Never true    [place] in the Last Year: Never true  Transportation Needs: No Transportation Needs (07/31/2022)   PRAPARE - Hydrologist (Medical): No    Lack of Transportation (Non-Medical): No  Physical Activity: Inactive (07/31/2022)   Exercise Vital Sign    Days of Exercise per Week: 0 days    Minutes of Exercise per Session: 0 min  Stress: No Stress Concern Present (07/31/2022)   [place]    Feeling of Stress : Not at all  Social Connections: Moderately Isolated  (01/29/2020)   Social Connection and Isolation Panel [NHANES]    Frequency of Communication with Friends and Family: More than three times a week    Frequency of Social Gatherings with Friends and Family: Once a week    Attends Religious Services: Never    Marine scientist or Organizations: No    Attends Archivist Meetings: Never    Marital Status: Married   Past Surgical History:  Procedure Laterality Date   ANEURYSM COILING  04/2009   Dr. Estanislado Pandy   APPENDECTOMY     BACK SURGERY  02/28/2021  BACK SURGERY     08/2021   BREAST LUMPECTOMY Right 1986   benign   DIAGNOSTIC LAPAROSCOPY     Proable laparoscopic right colectomy 03-14-18 Dr. Excell Seltzer   EYE SURGERY Bilateral    ioc lens for cataracts   LAPAROSCOPIC RIGHT COLECTOMY Right 03/14/2018   Procedure: LAPAROSCOPIC ASSISTED  RIGHT COLECTOMY;  Surgeon: Excell Seltzer, MD;  Location: [facility];  Service: General;  Laterality: Right;   LAPAROSCOPY N/A 03/14/2018   Procedure: LAPAROSCOPY;  Surgeon: Excell Seltzer, MD;  Location: [facility];  Service: General;  Laterality: N/A;   LEFT HEART CATHETERIZATION WITH CORONARY ANGIOGRAM N/A 08/05/2014   Procedure: LEFT HEART CATHETERIZATION WITH CORONARY ANGIOGRAM;  Surgeon: Wellington Hampshire, MD;  Location: [place] [facility];  Service: Cardiovascular;  Laterality: N/A;   LUMBAR LAMINECTOMY N/A 02/28/2021   Procedure: LEFT L1-L2 MICRODISCECTOMY AND FASCIECTOMY;  Surgeon: Marybelle Killings, MD;  Location: [place] [facility];  Service: Orthopedics;  Laterality: N/A;   TONSILLECTOMY     TOTAL ABDOMINAL HYSTERECTOMY  1970   complete   TRANSLABYRINTHINE PROCEDURE  2002   [facility] Dr. Vicie Mutters tumor removal   TRANSURETHRAL RESECTION OF BLADDER TUMOR N/A 02/08/2018   Procedure: TRANSURETHRAL RESECTION OF BLADDER TUMOR (TURBT)/ POSTOPERATIVE INSTILLATION OF CHEMO THERAPY;  Surgeon: Festus Aloe, MD;  [facility];  Service: Urology;  Laterality: N/A;   Past Surgical History:   Procedure Laterality Date   ANEURYSM COILING  04/2009   Dr. Estanislado Pandy   APPENDECTOMY     BACK SURGERY  02/28/2021   BACK SURGERY     08/2021   BREAST LUMPECTOMY Right 1986   benign   DIAGNOSTIC LAPAROSCOPY     Proable laparoscopic right colectomy 03-14-18 Dr. Excell Seltzer   EYE SURGERY Bilateral    ioc lens for cataracts   LAPAROSCOPIC RIGHT COLECTOMY Right 03/14/2018   Procedure: LAPAROSCOPIC ASSISTED  RIGHT COLECTOMY;  Surgeon: Excell Seltzer, MD;  [facility];  Service: General;  Laterality: Right;   LAPAROSCOPY N/A 03/14/2018   Procedure: LAPAROSCOPY;  Surgeon: Excell Seltzer, MD;  [facility];  Service: General;  Laterality: N/A;   LEFT HEART CATHETERIZATION WITH CORONARY ANGIOGRAM N/A 08/05/2014   Procedure: LEFT HEART CATHETERIZATION WITH CORONARY ANGIOGRAM;  Surgeon: Wellington Hampshire, MD;  Location: [place] [facility];  Service: Cardiovascular;  Laterality: N/A;   LUMBAR LAMINECTOMY N/A 02/28/2021   Procedure: LEFT L1-L2 MICRODISCECTOMY AND FASCIECTOMY;  Surgeon: Marybelle Killings, MD;  Location: [place];  Service: Orthopedics;  Laterality: N/A;   TONSILLECTOMY     TOTAL ABDOMINAL HYSTERECTOMY  1970   complete   TRANSLABYRINTHINE PROCEDURE  2002   [facility] Dr. Vicie Mutters tumor removal   TRANSURETHRAL RESECTION OF BLADDER TUMOR N/A 02/08/2018   Procedure: TRANSURETHRAL RESECTION OF BLADDER TUMOR (TURBT)/ POSTOPERATIVE INSTILLATION OF CHEMO THERAPY;  Surgeon: Festus Aloe, MD;  Location: [facility];  Service: Urology;  Laterality: N/A;   Past Medical History:  Diagnosis Date   AN (acoustic neuroma) ([place])    deafness in R ear   Aneurysm ([place])    basilar tip aneursym s/p stent assisted coiling 04/26/09   Anxiety    Bladder cancer ([facility])    Cataract    Cigarette smoker    COPD (chronic obstructive pulmonary disease) ([facility])    mild, no inhalers or oxygen used   Coronary artery disease    no current cardiologist  pt. denies   Depression    Headache    tension  headaches due to aneurysm repair with stent  Hyperlipidemia    Hypertension    Hypothyroidism    IBS (irritable bowel syndrome)    Mitral valve prolapse    mild takes beta blocker   Osteopenia    Stroke ([place])    x 3 ministrokes last one feb 2014   Venous insufficiency    Vitamin D deficiency    There were no vitals taken for this visit.  Opioid Risk Score:   Fall Risk Score:  `1  Depression screen [facility] 2/9     07/31/2022    2:07 PM 07/21/2022   12:10 PM 05/26/2022    9:42 AM 02/14/2022   11:03 AM 12/01/2021    8:21 AM 08/03/2021    9:10 AM 05/03/2021    8:57 AM  Depression screen PHQ 2/9  Decreased Interest 0 1 0 0 1 2 1   Down, Depressed, Hopeless 0 1 3 1 3 3 2   PHQ - 2 Score 0 2 3 1 4 5 3   Altered sleeping   0  0 3 0  Tired, decreased energy   3  3 3 3   Change in appetite   3  3 3 3   Feeling bad or failure about yourself    3  1 0 0  Trouble concentrating   0  0 3 2  Moving slowly or fidgety/restless   0  0 0 0  Suicidal thoughts   0  0 2 0  PHQ-9 Score   12  11 19 11   Difficult doing work/chores   Somewhat difficult  Not difficult at all Not difficult at all Not difficult at all      Review of Systems  Musculoskeletal:  Positive for back pain.  All other systems reviewed and are negative.     Objective:   Physical Exam   Physical Exam Gen: no distress, normal appearing HEENT: oral mucosa pink and moist, NCAT Cardio: Reg rate Chest: normal effort, normal rate of breathing Abd: soft, non-distended Ext: no edema Psych: Tearful, reports due to severe pain. Skin: intact Neuro: Alert and awake, follows commands, cranial nerves II through XII grossly intact, speech and language normal Sensation to light touch in all 4 extremities Strength  5 out of 5 right lower extremity and hip flexion, knee extension, ankle PF and DF Strength 4+ out of 5 in left lower extremity hip flexion, knee extension, ankle PF and DF-she reports this is chronic since her prior  surgery Musculoskeletal: Slump negative b/l but resulted in back pain Greatest tenderness to palpation left SI joint Mild T-spine paraspinal tenderness Facet loading negative   MRI T spine 05/01/22 FINDINGS: Alignment:  Physiologic.   Vertebrae: No acute fracture, evidence of discitis, or aggressive bone lesion. T7 vertebral body hemangioma noted.   Cord:  Normal signal and morphology.   Paraspinal and other soft tissues: No acute paraspinal abnormality.   Disc levels:   Disc spaces: Degenerative disease with disc height loss at T10-11 and T11-12. Degenerative disease with disc height loss at T1-2. Partially visualized on the scout images is degenerative disease with disc height loss at C3-4, C4-5 and C5-6.   T1-T2: Mild broad-based disc bulge. Severe right and moderate left foraminal stenosis. No spinal stenosis.   T2-T3: No disc protrusion, foraminal stenosis or central canal stenosis.   T3-T4: No disc protrusion, foraminal stenosis or central canal stenosis.   T4-T5: No disc protrusion, foraminal stenosis or central canal stenosis.   T5-T6: No disc protrusion, foraminal stenosis or central canal stenosis.   T6-T7:  No disc protrusion, foraminal stenosis or central canal stenosis.   T7-T8: No disc protrusion, foraminal stenosis or central canal stenosis.   T8-T9: No disc protrusion, foraminal stenosis or central canal stenosis.   T9-T10: No disc protrusion, foraminal stenosis or central canal stenosis.   T10-T11: Mild broad-based disc bulge. No foraminal or central canal stenosis. Right perineural cyst noted.   T11-T12: No disc protrusion, foraminal stenosis or central canal stenosis.   IMPRESSION: 1. At T1-2 there is a mild broad-based disc bulge. Severe right and moderate left foraminal stenosis. 2. No acute osseous injury of the thoracic spine.   L spine MRI 12/10/21 FINDINGS: Segmentation: Standard. Lowest well-formed disc space labeled the L5-S1  level.   Alignment: Up to 4 mm anterolisthesis of L5 on S1, stable. Trace anterolisthesis of L3 on L4, also relatively stable. Straightening of the normal lumbar lordosis elsewhere. Underlying sigmoid scoliosis.   Vertebrae: Susceptibility artifact from interval PLIF at L1-2. Vertebral body height maintained without acute or chronic fracture. Bone marrow signal intensity within normal limits. Few small benign hemangiomata noted. No worrisome osseous lesions. Mild reactive marrow edema noted about the partially visualized T10-11 interspace. Reactive edema present about the right L5-S1 facet due to facet arthritis.   Conus medullaris and cauda equina: Conus extends to the L1-2 level. Conus and cauda equina appear normal.   Paraspinal and other soft tissues: Postoperative changes within the posterior paraspinous soft tissues without adverse features. Few scattered cysts noted about the partially visualized kidneys, similar to prior, likely benign. No follow-up imaging recommended.   Disc levels:   T11-12: Seen only on sagittal projection. Mild disc bulge with reactive endplate spurring. Left worse than right facet hypertrophy. No significant spinal stenosis. Foramina appear patent.   T12-L1: Chronic endplate Schmorl's node deformities without significant disc bulge. Mild right-sided facet hypertrophy. No significant stenosis.   L1-2: Interval PLIF. No residual or recurrent spinal stenosis. Foramina appear patent.   L2-3: Disc bulge with disc desiccation and intervertebral disc space narrowing. Disc bulging eccentric to the left. Reactive endplate spurring. Moderate left with mild right facet arthrosis. Resultant mild narrowing of the left lateral recess. Mild left L2 foraminal narrowing. Right neural foramen remains patent. Appearance is stable.   L3-4: Degenerative intervertebral disc space narrowing with diffuse disc bulge and disc desiccation. Disc bulging eccentric to the  left with left-sided reactive endplate spurring. Resultant extraforaminal disc osteophyte contacts the exiting left L3 nerve root as it courses of the left neural foramen (series 101, image 20). Moderate left with mild right facet arthrosis. Resultant mild narrowing of the left lateral recess. Mild left L3 foraminal stenosis. Right neural foramen remains patent. Appearance is stable.   L4-5: Disc bulge with disc desiccation. Reactive endplate spurring. Mild to moderate facet hypertrophy. No more than mild narrowing of the lateral recesses bilaterally. Mild to moderate right with mild left L4 foraminal stenosis. Appearance is stable.   L5-S1: Anterolisthesis. Disc desiccation with broad posterior disc bulge. Severe right-sided facet arthrosis with reactive marrow edema. Moderate left-sided foraminal narrowing. No significant spinal stenosis. Mild right L5 foraminal stenosis. Appearance is similar.   IMPRESSION: 1. Interval PLIF at L1-2 without residual or recurrent stenosis. 2. Multilevel degenerative spondylosis elsewhere throughout the lumbar spine with resultant mild left lateral recess and foraminal stenosis at L2-3 and L3-4, and mild to moderate bilateral foraminal narrowing at L4-5 and L5-S1 as above. 3. Advanced right-sided facet arthrosis at L5-S1 with associated reactive marrow edema, which could serve as a source for  lower back pain.  T-spine      Assessment & Plan:   Chronic lumbar and thoracic back pain and failed back syndrome, I also suspect she has some SI joint dysfunction on the left -Hx of  L1-L2 TLIF -UDS and pain agreement completed prior visit -Recheck UDS -Continue hydrocodone at 5 mg dose TID #90 with next refill-ordered -Pt seen by Dr. Letta Pate for left SI joint injection- helped for few days -Pt has allergy noted to hydrocodone in chart-she insists this is not correct and she does not have this allergy   THC use -Patient reports she is not currently  using -Repeat UDS today

## 2022-09-01 ENCOUNTER — Ambulatory Visit (INDEPENDENT_AMBULATORY_CARE_PROVIDER_SITE_OTHER): Payer: Medicare Other | Admitting: Orthopedic Surgery

## 2022-09-01 ENCOUNTER — Other Ambulatory Visit (INDEPENDENT_AMBULATORY_CARE_PROVIDER_SITE_OTHER): Payer: Medicare Other

## 2022-09-01 DIAGNOSIS — Z981 Arthrodesis status: Secondary | ICD-10-CM

## 2022-09-01 LAB — TOXASSURE SELECT,+ANTIDEPR,UR

## 2022-09-01 NOTE — Progress Notes (Signed)
Orthopedic Spine Surgery Office Note  Assessment: Patient is a 82 y.o. female status post L1/2 TLIF and PSIF for recurrent disc herniation.  Having significant, disabling low back pain   Plan: -Patient has tried physical therapy, home exercises, NSAIDs, Tylenol, steroid injections, patches, creams, operative intervention, pain management -Encouraged her to continue with pain management.  Told her that they could try ablations if they find particular facet or SI joint that is causing her pain -Did not recommend surgical intervention for back pain at this time.  Since her back pain preceded the TLIF, I do not suspect pseudoarthrosis as the etiology of her pain.  There is also no lucency around the screws and screws not backed out. -Patient should return to office in 26 weeks, x-rays at next visit: AP/lateral/flex/ex lumbar   Patient expressed understanding of the plan and all questions were answered to the patient's satisfaction.   ___________________________________________________________________________  History: Patient is a 82 y.o. female who has been previously seen in the office for comes in for low back pain.  She has a history of 2 lumbar surgeries.  She had an L1/2 microdiscectomy with recurrent disc herniation so she underwent L1/2 TLIF and PSIF at a later date.  She states that she did not get any relief with the surgery.  She feels that her pain got worse after surgery.  She has had significant low back pain.  Today she points to the lumbar spine and the immediately adjacent paraspinal muscles.  She has no pain radiating down the lower extremities.  Pain is severe and the narcotic medications do help her.  She says that she has trouble walking until the medications take effect.  Previous treatments: physical therapy, home exercises, NSAIDs, Tylenol, steroid injections, patches, creams, operative intervention, pain management  Physical Exam:  General: no acute distress, appears stated  age Neurologic: alert, answering questions appropriately, following commands Respiratory: unlabored breathing on room air, symmetric chest rise Psychiatric: appropriate affect, normal cadence to speech   MSK (spine):  -Strength exam      Left  Right EHL    4/5  4/5 TA    4/5  5/5 GSC    4/5  5/5 Knee extension  5/5  5/5 Hip flexion   5/5  5/5  -Sensory exam    Sensation intact to light touch in L3-S1 nerve distributions of bilateral lower extremities  -Achilles DTR: 1/4 on the left, 1/4 on the right -Patellar tendon DTR: 1/4 on the left, 1/4 on the right  -Straight leg raise: Negative bilaterally -Contralateral straight leg raise: Negative bilaterally -Clonus: no beats bilaterally  Imaging: XR of the lumbar spine from 09/01/2022 was independently reviewed and interpreted, showing L1/2 to interbody device with posterior instrumentation.  There is no lucency around the interbody or the instrumentation.  The screws have not backed out.  The interbody device appears to be contained within the disc space and in appropriate position.  No fracture or dislocation seen.  No evidence of instability on flexion/extension views.   Patient name: Ana Santos Patient MRN: [id number] Date of visit: 09/01/22

## 2022-09-07 ENCOUNTER — Telehealth: Payer: Self-pay | Admitting: Family Medicine

## 2022-09-07 ENCOUNTER — Ambulatory Visit (INDEPENDENT_AMBULATORY_CARE_PROVIDER_SITE_OTHER): Payer: Medicare Other | Admitting: Family Medicine

## 2022-09-07 VITALS — BP 120/68 | HR 67 | Temp 97.9°F | Ht 62.0 in | Wt 122.2 lb

## 2022-09-07 DIAGNOSIS — E039 Hypothyroidism, unspecified: Secondary | ICD-10-CM

## 2022-09-07 DIAGNOSIS — I10 Essential (primary) hypertension: Secondary | ICD-10-CM

## 2022-09-07 DIAGNOSIS — M961 Postlaminectomy syndrome, not elsewhere classified: Secondary | ICD-10-CM | POA: Diagnosis not present

## 2022-09-07 DIAGNOSIS — F418 Other specified anxiety disorders: Secondary | ICD-10-CM

## 2022-09-07 LAB — TSH: TSH: 0.2 u[IU]/mL — ABNORMAL LOW (ref 0.35–5.50)

## 2022-09-07 MED ORDER — LEVOTHYROXINE SODIUM 50 MCG PO TABS: 50.0000 ug | ORAL_TABLET | Freq: Every day | ORAL | 3 refills | Status: DC

## 2022-09-07 MED ORDER — LEVOTHYROXINE SODIUM 50 MCG PO TABS: 75.0000 ug | ORAL_TABLET | Freq: Every day | ORAL | 3 refills | Status: DC

## 2022-09-07 NOTE — Assessment & Plan Note (Signed)
Ms. Hoberg is still struggling at time, but tries to keep her spirits up. As she is just getting into pain management, I will hold off on adjusting her sertraline for now. I will reconsider this at her next visit.

## 2022-09-07 NOTE — Assessment & Plan Note (Signed)
I will reassess her TSH today to make sure this is stable. Continue levothyroxine 75 mcg daily.

## 2022-09-07 NOTE — Addendum Note (Signed)
Addended by: Haydee Salter on: 09/07/2022 12:25 PM   Modules accepted: Orders

## 2022-09-07 NOTE — Progress Notes (Signed)
[facility] [address] [place] [address] Dept: [phone] Dept Fax: [phone]  Chronic Care Office Visit  Subjective:    Patient ID: Ana Santos, female    DOB: 1941-01-05, 82 y.o..   MRN: [id number]  Chief Complaint  Patient presents with   Medical Management of Chronic Issues    3 month f/u.    History of Present Illness:  Patient is in today for reassessment of chronic medical issues.  Ana Santos has a history of hypertension, managed with amlodipine 5 mg daily. She also has hyperlipidemia, which is managed with pravastatin 40 mg daily.   Ana Santos has a history of hypothyroidism. She is managed on levothyroxine 75 mcg daily.     Ana Santos has a history of a fall in Sept. 2021. An MRI scan in may showed multi-level degenerative lumbar disease with foraminal stenoses. She had multiple ESIs without improvement. She had a left L1-L2 microdiscectomy on 02/28/2021. She had a lumbar fusion on 08/23/2021 due to recurrent disc herniation s/p microdiscectomy. She has now had further ESIs. As no other therapy had been successful, she is now being seen by pain management. She is being managed on Vicodin 5-325 mg TID. She denies any constipation associated with this.  Ana Santos has anxiety with depression. Her chronic pain has made this difficult. She is managed on sertraline 50 mg daily.  Past Medical History: Patient Active Problem List   Diagnosis Date Noted   Oxygen desaturation 08/24/2021   Other secondary scoliosis, lumbar region 08/23/2021    Class: Chronic   Fusion of spine of lumbar region 08/23/2021   Lumbar post-laminectomy syndrome 06/15/2021   Status post lumbar microdiscectomy 03/09/2021   Recurrent herniation of lumbar disc 02/28/2021   Foraminal stenosis due to intervertebral disc disease 01/21/2021   History of bladder cancer 01/21/2021   Labyrinthitis 12/08/2019   Left-sided low back pain  without sciatica 05/05/2019   Visual loss, transient, bilateral 02/21/2019   Essential hypertension 02/21/2019   Colonic mass s/p lap ileocectomy 03/14/2018 03/14/2018   Acute lower GI bleeding 03/14/2018   Chronic anticoagulation 03/14/2018   Chronic tension-type headache, not intractable 05/31/2015   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 ([facility]) 03/11/2014   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   RLS (restless legs syndrome) 07/04/2012   History of cerebral aneurysm 03/26/2009   Vitamin D deficiency 08/10/2008   Venous insufficiency 08/10/2008   History of acoustic neuroma 08/18/2007   Hypothyroidism 08/18/2007   Tobacco use disorder, continuous 08/18/2007   Mixed hyperlipidemia 07/18/2007   Anxiety with depression 07/18/2007   COPD (chronic obstructive pulmonary disease) with chronic bronchitis ([facility]) 07/18/2007   Fibromyalgia 07/18/2007   Past Surgical History:  Procedure Laterality Date   ANEURYSM COILING  04/2009   Dr. Estanislado Pandy   APPENDECTOMY     BACK SURGERY  02/28/2021   BACK SURGERY     08/2021   BREAST LUMPECTOMY Right 1986   benign   DIAGNOSTIC LAPAROSCOPY     Proable laparoscopic right colectomy 03-14-18 Dr. Excell Seltzer   EYE SURGERY Bilateral    ioc lens for cataracts   LAPAROSCOPIC RIGHT COLECTOMY Right 03/14/2018   Procedure: LAPAROSCOPIC ASSISTED  RIGHT COLECTOMY;  Surgeon: Excell Seltzer, MD;  Location: [facility];  Service: General;  Laterality: Right;   LAPAROSCOPY N/A 03/14/2018   Procedure: LAPAROSCOPY;  Surgeon: Excell Seltzer, MD;  Location: [facility];  Service: General;  Laterality: N/A;   LEFT HEART CATHETERIZATION  WITH CORONARY ANGIOGRAM N/A 08/05/2014   Procedure: LEFT HEART CATHETERIZATION WITH CORONARY ANGIOGRAM;  Surgeon: Wellington Hampshire, MD;  Location: [facility];  Service: Cardiovascular;  Laterality: N/A;   LUMBAR LAMINECTOMY N/A 02/28/2021   Procedure: LEFT L1-L2 MICRODISCECTOMY AND FASCIECTOMY;  Surgeon:  Marybelle Killings, MD;  Location: [facility];  Service: Orthopedics;  Laterality: N/A;   TONSILLECTOMY     TOTAL ABDOMINAL HYSTERECTOMY  1970   complete   TRANSLABYRINTHINE PROCEDURE  2002   [facility] Dr. Vicie Mutters tumor removal   TRANSURETHRAL RESECTION OF BLADDER TUMOR N/A 02/08/2018   Procedure: TRANSURETHRAL RESECTION OF BLADDER TUMOR (TURBT)/ POSTOPERATIVE INSTILLATION OF CHEMO THERAPY;  Surgeon: Festus Aloe, MD;  Location: [facility];  Service: Urology;  Laterality: N/A;   Family History  Problem Relation Age of Onset   Heart disease Mother    Cancer Mother        Lung   Heart attack Mother    Diabetes Mother    Cancer Father        Lung   Diabetes Sister    Cancer Sister        unkown type   Diabetes Brother    Lung cancer Brother        Lung   AAA (abdominal aortic aneurysm) Neg Hx    Colon cancer Neg Hx    Stomach cancer Neg Hx    Esophageal cancer Neg Hx    Liver cancer Neg Hx    Pancreatic cancer Neg Hx    Rectal cancer Neg Hx    Outpatient Medications Prior to Visit  Medication Sig Dispense Refill   amLODipine (NORVASC) 5 MG tablet TAKE 1 TABLET DAILY 90 tablet 3   Cholecalciferol (VITAMIN D-3) 125 MCG (5000 UT) TABS Take 5,000 Units by mouth daily.     clopidogrel (PLAVIX) 75 MG tablet TAKE 1 TABLET DAILY 90 tablet 3   HYDROcodone-acetaminophen (NORCO/VICODIN) 5-325 MG tablet Take 1 tablet by mouth every 8 (eight) hours as needed for moderate pain. 90 tablet 0   hydroxypropyl methylcellulose / hypromellose (ISOPTO TEARS / GONIOVISC) 2.5 % ophthalmic solution Place 1 drop into both eyes at bedtime.     levothyroxine (SYNTHROID) 75 MCG tablet Take 1 tablet (75 mcg total) by mouth daily. 90 tablet 3   pramipexole (MIRAPEX) 0.5 MG tablet Take one nightly one hour prior to going to bed as needed for restless leg syndrome. 90 tablet 4   pravastatin (PRAVACHOL) 40 MG tablet TAKE 1 TABLET DAILY 90 tablet 3   sertraline (ZOLOFT) 50 MG tablet Take 1.5 tablets (75 mg  total) by mouth daily. 135 tablet 3   No facility-administered medications prior to visit.   Allergies  Allergen Reactions   Prednisone Other (See Comments)    Hallucinations   Atorvastatin Other (See Comments)     Lipitor caused arm pain (3/09)   Hydrocodone-Acetaminophen Other (See Comments)    hallucinations   Morphine Other (See Comments)    hallucinations   Nortriptyline Nausea And Vomiting    Caused nausea and vomiting and shaking, kept her up all night   Topamax [Topiramate] Nausea And Vomiting    *dizziness*   Tramadol Other (See Comments)    withdrawal   Objective:   Today's Vitals   09/07/22 0756  BP: 120/68  Pulse: 67  Temp: 97.9 F (36.6 C)  TempSrc: Temporal  SpO2: 97%  Weight: 122 lb 3.2 oz (55.4 kg)  Height: 5\' 2"  (1.575 m)   Body mass  index is 22.35 kg/m.   General: Well developed, well nourished. No acute distress. Psych: Alert and oriented. Normal mood and affect.  Health Maintenance Due  Topic Date Due   DTaP/Tdap/Td (1 - Tdap) Never done   Zoster Vaccines- Shingrix (1 of 2) Never done        09/07/2022    8:18 AM 08/29/2022    9:58 AM 07/31/2022    2:07 PM  Depression screen PHQ 2/9  Decreased Interest 3 0 0  Down, Depressed, Hopeless 2 0 0  PHQ - 2 Score 5 0 0  Altered sleeping 1    Tired, decreased energy 3    Change in appetite 3    Feeling bad or failure about yourself  3    Trouble concentrating 3    Moving slowly or fidgety/restless 0    Suicidal thoughts 0    PHQ-9 Score 18    Difficult doing work/chores Extremely dIfficult     Assessment & Plan:   Problem List Items Addressed This Visit       Cardiovascular and Mediastinum   Essential hypertension - Primary    Blood pressure is in good control. Continue amlodipine 5 mg daily.         Endocrine   Hypothyroidism    I will reassess her TSH today to make sure this is stable. Continue levothyroxine 75 mcg daily.      Relevant Orders   TSH     Other   Anxiety with  depression    Ms. Dahir is still struggling at time, but tries to keep her spirits up. As she is just getting into pain management, I will hold off on adjusting her sertraline for now. I will reconsider this at her next visit.      Lumbar post-laminectomy syndrome    Now working with pain management.       Return in about 3 months (around 12/08/2022) for Reassessment.   Haydee Salter, MD

## 2022-09-07 NOTE — Telephone Encounter (Signed)
Ana Santos from [facility] [phone] called and said that they was sent 2 prescription with 2 different direction.please give the pharmacy a call back

## 2022-09-07 NOTE — Assessment & Plan Note (Signed)
Blood pressure is in good control. Continue amlodipine 5 mg daily. 

## 2022-09-07 NOTE — Assessment & Plan Note (Signed)
Now working with pain management.

## 2022-09-11 ENCOUNTER — Telehealth: Payer: Self-pay | Admitting: *Deleted

## 2022-09-11 NOTE — Telephone Encounter (Signed)
Called pharmacy and verified that she is to take 1 tablet daily. Dm/cma

## 2022-09-11 NOTE — Telephone Encounter (Signed)
Patient requesting a refill on Hydrocodone APAP 5-325. Last rx 09/04/22 #90. Patient states she has 1 tablet left. She also stated your all discussed possibly changing to a stronger medication b/c she was using so much of Hydrocodone without pain relief.

## 2022-09-11 NOTE — Telephone Encounter (Signed)
Patient left a message about a refill. She did not leave name of medication. Last refill Hydrocodone APAP 09/04/22 #90.  LM for patient to call back.

## 2022-09-12 NOTE — Telephone Encounter (Signed)
I called the pharmacy. She has one there can be filled. Patient informed. If there is an issue to please call back.

## 2022-09-14 ENCOUNTER — Telehealth: Payer: Self-pay | Admitting: Family Medicine

## 2022-09-14 NOTE — Telephone Encounter (Signed)
Spoke to patient.  Dm/cma  

## 2022-09-14 NOTE — Telephone Encounter (Signed)
Pt is wanting a cb concerning her most recent lab results. Please advise pt at [phone]

## 2022-09-15 ENCOUNTER — Telehealth: Payer: Self-pay | Admitting: *Deleted

## 2022-09-15 NOTE — Telephone Encounter (Signed)
Urine drug screen for this encounter is consistent for prescribed medication 

## 2022-09-22 ENCOUNTER — Ambulatory Visit: Payer: Medicare Other | Admitting: Orthopedic Surgery

## 2022-10-10 ENCOUNTER — Encounter: Payer: Medicare Other | Attending: Physical Medicine & Rehabilitation | Admitting: Physical Medicine & Rehabilitation

## 2022-10-10 ENCOUNTER — Encounter: Payer: Self-pay | Admitting: Physical Medicine & Rehabilitation

## 2022-10-10 VITALS — BP 145/80 | HR 58 | Ht 62.0 in | Wt 120.8 lb

## 2022-10-10 DIAGNOSIS — G894 Chronic pain syndrome: Secondary | ICD-10-CM

## 2022-10-10 DIAGNOSIS — Z79891 Long term (current) use of opiate analgesic: Secondary | ICD-10-CM | POA: Diagnosis not present

## 2022-10-10 DIAGNOSIS — Z5181 Encounter for therapeutic drug level monitoring: Secondary | ICD-10-CM | POA: Diagnosis not present

## 2022-10-10 DIAGNOSIS — M25551 Pain in right hip: Secondary | ICD-10-CM

## 2022-10-10 DIAGNOSIS — M47817 Spondylosis without myelopathy or radiculopathy, lumbosacral region: Secondary | ICD-10-CM | POA: Diagnosis not present

## 2022-10-10 DIAGNOSIS — M961 Postlaminectomy syndrome, not elsewhere classified: Secondary | ICD-10-CM | POA: Diagnosis not present

## 2022-10-10 MED ORDER — HYDROCODONE-ACETAMINOPHEN 5-325 MG PO TABS: 1.0000 | ORAL_TABLET | Freq: Three times a day (TID) | ORAL | 0 refills | Status: DC | PRN

## 2022-10-10 MED ORDER — DICLOFENAC SODIUM 1 % EX GEL: 4.0000 g | Freq: Four times a day (QID) | CUTANEOUS | 3 refills | Status: DC

## 2022-10-10 NOTE — Progress Notes (Unsigned)
Subjective:    Patient ID: Ana Santos, female    DOB: July 22, 1940, 82 y.o.   MRN: [id number]  HPI  HPI Ana Santos is a 82 y.o. year old female  who  has a past medical history of AN (acoustic neuroma) (HCC), Aneurysm (HCC), Anxiety, Bladder cancer (HCC), Cataract, Cigarette smoker, COPD (chronic obstructive pulmonary disease) (HCC), Coronary artery disease, Depression, Headache, Hyperlipidemia, Hypertension, Hypothyroidism, IBS (irritable bowel syndrome), Mitral valve prolapse, Osteopenia, Stroke (HCC), Venous insufficiency, and Vitamin D deficiency.   They are presenting to PM&R clinic as a new patient for pain management evaluation. They were referred for treatment of chronic lumbar and thoracic back pain.   She reports that she has had worsening pain for about 4 years.  No consistent pain shooting down her legs.  Pain is stabbing in quality.  Lifting anything makes it worse.  Denies any numbness or paresthesias in her limbs.  She had a prior left L1-2 hemilaminectomy and then later a L1-L2 TLIF in March 2023.  She is previously followed by Dr. Otelia Sergeant.  She is now being seen by Dr. Willia Craze orthopedic spine surgery.  She reports a history of bowel surgery with small intestine removed previously.   Red flag symptoms: Patient denies saddle anesthesia, loss of bowel or bladder continence, new weakness, new numbness/tingling, or pain waking up at nighttime.   Medications tried: Tylenol, advil, lidcaine patch, icy hot with mild benefit She is currently on Plavix which limits her medication options Hydrocodone 10 mg previously helped control her pain  tramadol-this did help her pain however she had very severe withdrawal issues when she did not get her medication on time, would like to avoid this medication if possible Oxycodone helped her pain previously   Other treatments: PT/OT  made it worse Heat pad helps 10-15 ESI injections- reports minimal benefit  L1-2 hemilaminectomy  and then later a L1-L2 TLIF in March 2023.  Without sustained improvement   Interval history 06/27/2022 Ana Santos is 82 is here for follow-up regarding her chronic pain.  She continues to have lumbar and thoracic back pain.  She says the pain has been very severe since medications have been discontinued.  She did get some tramadol at the end of December which provided mild benefit to her pain.  She denies any use of other substances or marijuana since her last visit.  She says she regrets having her back surgery done last year.   Interval history 08/29/2022 Ana Santos is 82 is here for follow-up regarding her chronic back pain primarily in her lumbar and thoracic back.  She also recently had a left SI joint injection by Dr. Wynn Banker.  This provided short-term pain relief however the pain returned after a day or two.  Hydrocodone 5 mg has been helping her pain and makes it more tolerable.  She is not having any side effects of this medication.  She does report that hydrocodone does not last long enough and this leaves her in significant pain throughout the day.   Interval history 10/10/22 Ana Santos is here for follow-up regarding her chronic pain.  Pill counts appear to be consistent with her hydrocodone.  She is trying only uses medication when the pain is very severe.  No side effects with the hydrocodone reported.  The hydrocodone is helping her lower back pain remains tolerable.  She is also had some discomfort around her right inguinal area/anterior hip.  She says this started after doing some work in the yard.  She  has occasional use heating pads for her lower back and found this to be helpful.  She has not tried TENS before.  Pain Inventory Average Pain 9 Pain Right Now 8 My pain is constant and stabbing  In the last 24 hours, has pain interfered with the following? General activity 10 Relation with others 10 Enjoyment of life 10 What TIME of day is your pain at its worst? morning , daytime,  evening, and night Sleep (in general) Fair  Pain is worse with: walking, bending, sitting, standing, and some activites Pain improves with: rest, heat/ice, and medication Relief from Meds: 7  Family History  Problem Relation Age of Onset   Heart disease Mother    Cancer Mother        Lung   Heart attack Mother    Diabetes Mother    Cancer Father        Lung   Diabetes Sister    Cancer Sister        unkown type   Diabetes Brother    Lung cancer Brother        Lung   AAA (abdominal aortic aneurysm) Neg Hx    Colon cancer Neg Hx    Stomach cancer Neg Hx    Esophageal cancer Neg Hx    Liver cancer Neg Hx    Pancreatic cancer Neg Hx    Rectal cancer Neg Hx    Social History   Socioeconomic History   Marital status: Married    Spouse name: Windy Fast   Number of children: 5   Years of education: Not on file   Highest education level: Not on file  Occupational History   Occupation: alterations   Occupation: Retired    Comment: Neurosurgeon  Tobacco Use   Smoking status: Every Day    Packs/day: 0.25    Years: 50.00    Additional pack years: 0.00    Total pack years: 12.50    Types: Cigarettes   Smokeless tobacco: Never   Tobacco comments:    4-5 cigs daily  is aware she needs to quit  Vaping Use   Vaping Use: Never used  Substance and Sexual Activity   Alcohol use: No    Alcohol/week: 0.0 standard drinks of alcohol   Drug use: Yes    Types: Hydrocodone   Sexual activity: Not Currently  Other Topics Concern   Not on file  Social History Narrative   Pt is married, 5 sons (4 living), 14 grandchildren, 4 great grandchildren.   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   Social Determinants of Corporate investment banker Strain: Low Risk  (07/31/2022)   Overall Financial Resource Strain (CARDIA)    Difficulty of Paying Living Expenses: Not hard at all  Food Insecurity: No Food Insecurity (07/31/2022)   Hunger Vital Sign    Worried  About Running Out of Food in the Last Year: Never true    Ran Out of Food in the Last Year: Never true  Transportation Needs: No Transportation Needs (07/31/2022)   PRAPARE - Administrator, Civil Service (Medical): No    Lack of Transportation (Non-Medical): No  Physical Activity: Inactive (07/31/2022)   Exercise Vital Sign    Days of Exercise per Week: 0 days    Minutes of Exercise per Session: 0 min  Stress: No Stress Concern Present (07/31/2022)   [employer] of Occupational Health - Occupational Stress Questionnaire    Feeling of  Stress : Not at all  Social Connections: Moderately Isolated (01/29/2020)   Social Connection and Isolation Panel [NHANES]    Frequency of Communication with Friends and Family: More than three times a week    Frequency of Social Gatherings with Friends and Family: Once a week    Attends Religious Services: Never    Database administrator or Organizations: No    Attends Banker Meetings: Never    Marital Status: Married   Past Surgical History:  Procedure Laterality Date   ANEURYSM COILING  04/2009   Dr. Corliss Skains   APPENDECTOMY     BACK SURGERY  02/28/2021   BACK SURGERY     08/2021   BREAST LUMPECTOMY Right 1986   benign   DIAGNOSTIC LAPAROSCOPY     Proable laparoscopic right colectomy 03-14-18 Dr. Johna Sheriff   EYE SURGERY Bilateral    ioc lens for cataracts   LAPAROSCOPIC RIGHT COLECTOMY Right 03/14/2018   Procedure: LAPAROSCOPIC ASSISTED  RIGHT COLECTOMY;  Surgeon: Glenna Fellows, MD;  Location: [facility];  Service: General;  Laterality: Right;   LAPAROSCOPY N/A 03/14/2018   Procedure: LAPAROSCOPY;  Surgeon: Glenna Fellows, MD;  Location: [facility];  Service: General;  Laterality: N/A;   LEFT HEART CATHETERIZATION WITH CORONARY ANGIOGRAM N/A 08/05/2014   Procedure: LEFT HEART CATHETERIZATION WITH CORONARY ANGIOGRAM;  Surgeon: Iran Ouch, MD;  Location: [facility];  Service: Cardiovascular;  Laterality: N/A;    LUMBAR LAMINECTOMY N/A 02/28/2021   Procedure: LEFT L1-L2 MICRODISCECTOMY AND FASCIECTOMY;  Surgeon: Eldred Manges, MD;  Location: [facility];  Service: Orthopedics;  Laterality: N/A;   TONSILLECTOMY     TOTAL ABDOMINAL HYSTERECTOMY  1970   complete   TRANSLABYRINTHINE PROCEDURE  2002   [facility] Dr. Erroll Luna tumor removal   TRANSURETHRAL RESECTION OF BLADDER TUMOR N/A 02/08/2018   Procedure: TRANSURETHRAL RESECTION OF BLADDER TUMOR (TURBT)/ POSTOPERATIVE INSTILLATION OF CHEMO THERAPY;  Surgeon: Jerilee Field, MD;  Location: [facility];  Service: Urology;  Laterality: N/A;   Past Surgical History:  Procedure Laterality Date   ANEURYSM COILING  04/2009   Dr. Corliss Skains   APPENDECTOMY     BACK SURGERY  02/28/2021   BACK SURGERY     08/2021   BREAST LUMPECTOMY Right 1986   benign   DIAGNOSTIC LAPAROSCOPY     Proable laparoscopic right colectomy 03-14-18 Dr. Johna Sheriff   EYE SURGERY Bilateral    ioc lens for cataracts   LAPAROSCOPIC RIGHT COLECTOMY Right 03/14/2018   Procedure: LAPAROSCOPIC ASSISTED  RIGHT COLECTOMY;  Surgeon: Glenna Fellows, MD;  Location: [facility];  Service: General;  Laterality: Right;   LAPAROSCOPY N/A 03/14/2018   Procedure: LAPAROSCOPY;  Surgeon: Glenna Fellows, MD;  Location: [facility];  Service: General;  Laterality: N/A;   LEFT HEART CATHETERIZATION WITH CORONARY ANGIOGRAM N/A 08/05/2014   Procedure: LEFT HEART CATHETERIZATION WITH CORONARY ANGIOGRAM;  Surgeon: Iran Ouch, MD;  Location: [facility];  Service: Cardiovascular;  Laterality: N/A;   LUMBAR LAMINECTOMY N/A 02/28/2021   Procedure: LEFT L1-L2 MICRODISCECTOMY AND FASCIECTOMY;  Surgeon: Eldred Manges, MD;  Location: [facility];  Service: Orthopedics;  Laterality: N/A;   TONSILLECTOMY     TOTAL ABDOMINAL HYSTERECTOMY  1970   complete   TRANSLABYRINTHINE PROCEDURE  2002   [facility] Dr. Erroll Luna tumor removal   TRANSURETHRAL RESECTION OF BLADDER TUMOR N/A 02/08/2018   Procedure: TRANSURETHRAL RESECTION  OF BLADDER TUMOR (TURBT)/ POSTOPERATIVE INSTILLATION OF CHEMO THERAPY;  Surgeon: Jerilee Field, MD;  Location: [facility];  Service: Urology;  Laterality: N/A;   Past Medical History:  Diagnosis Date   AN (acoustic neuroma) (HCC)    deafness in R ear   Aneurysm (HCC)    basilar tip aneursym s/p stent assisted coiling 04/26/09   Anxiety    Bladder cancer (HCC)    Cataract    Cigarette smoker    COPD (chronic obstructive pulmonary disease) (HCC)    mild, no inhalers or oxygen used   Coronary artery disease    no current cardiologist  pt. denies   Depression    Headache    tension headaches due to aneurysm repair with stent   Hyperlipidemia    Hypertension    Hypothyroidism    IBS (irritable bowel syndrome)    Mitral valve prolapse    mild takes beta blocker   Osteopenia    Stroke (HCC)    x 3 ministrokes last one feb 2014   Venous insufficiency    Vitamin D deficiency    BP (!) 145/80   Pulse (!) 58   Ht 5\' 2"  (1.575 m)   Wt 120 lb 12.8 oz (54.8 kg)   SpO2 98%   BMI 22.09 kg/m   Original BP 160/83-rechecked Opioid Risk Score:   Fall Risk Score:  `1  Depression screen [facility] 2/9     10/10/2022    9:19 AM 09/07/2022    8:18 AM 08/29/2022    9:58 AM 07/31/2022    2:07 PM 07/21/2022   12:10 PM 05/26/2022    9:42 AM 02/14/2022   11:03 AM  Depression screen PHQ 2/9  Decreased Interest 1 3 0 0 1 0 0  Down, Depressed, Hopeless 1 2 0 0 1 3 1   PHQ - 2 Score 2 5 0 0 2 3 1   Altered sleeping  1    0   Tired, decreased energy  3    3   Change in appetite  3    3   Feeling bad or failure about yourself   3    3   Trouble concentrating  3    0   Moving slowly or fidgety/restless  0    0   Suicidal thoughts  0    0   PHQ-9 Score  18    12   Difficult doing work/chores  Extremely dIfficult    Somewhat difficult      Review of Systems  Constitutional: Negative.   HENT: Negative.    Eyes: Negative.   Respiratory: Negative.    Cardiovascular: Negative.    Gastrointestinal: Negative.   Endocrine: Negative.   Genitourinary: Negative.   Musculoskeletal:  Positive for back pain and gait problem.       Right groin pain and left flank pain  Skin: Negative.   Allergic/Immunologic: Negative.   Hematological:  Bruises/bleeds easily.       Plavix  Psychiatric/Behavioral:  Positive for dysphoric mood.   All other systems reviewed and are negative.      Objective:   Physical Exam    Physical Exam Gen: no distress, normal appearing HEENT: oral mucosa pink and moist, NCAT Cardio: Reg rate Chest: normal effort, normal rate of breathing Abd: soft, non-distended Ext: no edema Psych: Tearful, reports due to severe pain. Skin: intact Neuro: Alert and awake, follows commands, cranial nerves II through XII grossly intact, speech and language normal Sensation to light touch in all 4 extremities Moving all 4 extremities to gravity Musculoskeletal: Slump negative b/l  but resulted in back pain R anterior hip pain with internal and external rotation No greater trochanter tenderness Mild T-spine paraspinal tenderness Facet loading negative   MRI T spine 05/01/22 FINDINGS: Alignment:  Physiologic.   Vertebrae: No acute fracture, evidence of discitis, or aggressive bone lesion. T7 vertebral body hemangioma noted.   Cord:  Normal signal and morphology.   Paraspinal and other soft tissues: No acute paraspinal abnormality.   Disc levels:   Disc spaces: Degenerative disease with disc height loss at T10-11 and T11-12. Degenerative disease with disc height loss at T1-2. Partially visualized on the scout images is degenerative disease with disc height loss at C3-4, C4-5 and C5-6.   T1-T2: Mild broad-based disc bulge. Severe right and moderate left foraminal stenosis. No spinal stenosis.   T2-T3: No disc protrusion, foraminal stenosis or central canal stenosis.   T3-T4: No disc protrusion, foraminal stenosis or central canal stenosis.    T4-T5: No disc protrusion, foraminal stenosis or central canal stenosis.   T5-T6: No disc protrusion, foraminal stenosis or central canal stenosis.   T6-T7: No disc protrusion, foraminal stenosis or central canal stenosis.   T7-T8: No disc protrusion, foraminal stenosis or central canal stenosis.   T8-T9: No disc protrusion, foraminal stenosis or central canal stenosis.   T9-T10: No disc protrusion, foraminal stenosis or central canal stenosis.   T10-T11: Mild broad-based disc bulge. No foraminal or central canal stenosis. Right perineural cyst noted.   T11-T12: No disc protrusion, foraminal stenosis or central canal stenosis.   IMPRESSION: 1. At T1-2 there is a mild broad-based disc bulge. Severe right and moderate left foraminal stenosis. 2. No acute osseous injury of the thoracic spine.   L spine MRI 12/10/21 FINDINGS: Segmentation: Standard. Lowest well-formed disc space labeled the L5-S1 level.   Alignment: Up to 4 mm anterolisthesis of L5 on S1, stable. Trace anterolisthesis of L3 on L4, also relatively stable. Straightening of the normal lumbar lordosis elsewhere. Underlying sigmoid scoliosis.   Vertebrae: Susceptibility artifact from interval PLIF at L1-2. Vertebral body height maintained without acute or chronic fracture. Bone marrow signal intensity within normal limits. Few small benign hemangiomata noted. No worrisome osseous lesions. Mild reactive marrow edema noted about the partially visualized T10-11 interspace. Reactive edema present about the right L5-S1 facet due to facet arthritis.   Conus medullaris and cauda equina: Conus extends to the L1-2 level. Conus and cauda equina appear normal.   Paraspinal and other soft tissues: Postoperative changes within the posterior paraspinous soft tissues without adverse features. Few scattered cysts noted about the partially visualized kidneys, similar to prior, likely benign. No follow-up imaging  recommended.   Disc levels:   T11-12: Seen only on sagittal projection. Mild disc bulge with reactive endplate spurring. Left worse than right facet hypertrophy. No significant spinal stenosis. Foramina appear patent.   T12-L1: Chronic endplate Schmorl's node deformities without significant disc bulge. Mild right-sided facet hypertrophy. No significant stenosis.   L1-2: Interval PLIF. No residual or recurrent spinal stenosis. Foramina appear patent.   L2-3: Disc bulge with disc desiccation and intervertebral disc space narrowing. Disc bulging eccentric to the left. Reactive endplate spurring. Moderate left with mild right facet arthrosis. Resultant mild narrowing of the left lateral recess. Mild left L2 foraminal narrowing. Right neural foramen remains patent. Appearance is stable.   L3-4: Degenerative intervertebral disc space narrowing with diffuse disc bulge and disc desiccation. Disc bulging eccentric to the left with left-sided reactive endplate spurring. Resultant extraforaminal disc osteophyte contacts the exiting left  L3 nerve root as it courses of the left neural foramen (series 101, image 20). Moderate left with mild right facet arthrosis. Resultant mild narrowing of the left lateral recess. Mild left L3 foraminal stenosis. Right neural foramen remains patent. Appearance is stable.   L4-5: Disc bulge with disc desiccation. Reactive endplate spurring. Mild to moderate facet hypertrophy. No more than mild narrowing of the lateral recesses bilaterally. Mild to moderate right with mild left L4 foraminal stenosis. Appearance is stable.   L5-S1: Anterolisthesis. Disc desiccation with broad posterior disc bulge. Severe right-sided facet arthrosis with reactive marrow edema. Moderate left-sided foraminal narrowing. No significant spinal stenosis. Mild right L5 foraminal stenosis. Appearance is similar.   IMPRESSION: 1. Interval PLIF at L1-2 without residual or recurrent  stenosis. 2. Multilevel degenerative spondylosis elsewhere throughout the lumbar spine with resultant mild left lateral recess and foraminal stenosis at L2-3 and L3-4, and mild to moderate bilateral foraminal narrowing at L4-5 and L5-S1 as above. 3. Advanced right-sided facet arthrosis at L5-S1 with associated reactive marrow edema, which could serve as a source for lower back pain.  T-spine         Assessment & Plan:   Chronic lumbar and thoracic back pain and failed back syndrome, I also suspect she has some SI joint dysfunction on the left -Hx of  L1-L2 TLIF -UDS and pain agreement completed prior visit -Recheck UDS -Continue hydrocodone at 5 mg dose TID #90 with next refill-ordered -Pt seen by Dr. Wynn Banker for left SI joint injection- helped for few days -Pt has allergy noted to hydrocodone in chart-she insists this is not correct and she does not have this allergy -Zynex TENS and Heat/Cold therapy device ordered  R anterior hip pain -Reports due to recent activity -Voltaren gel ordered -Consider imaging if doesn't improve   THC use -Patient reports she is not currently using -UDS last visit consistent

## 2022-11-27 ENCOUNTER — Telehealth: Payer: Self-pay

## 2022-11-27 NOTE — Telephone Encounter (Signed)
Filled  Written  ID  Drug  QTY  Days  Prescriber  RX #  Dispenser  Refill  Daily Dose*  Pymt Type  PMP  10/11/2022 10/10/2022 1  Hydrocodone-Acetamin 5-325 Mg 90.00 30 Corie Chiquito [id number] Esi ([id number]) 0/0 15.00 MME Comm Ins [place] 09/12/2022 08/29/2022 1  Hydrocodone-Acetamin 5-325 Mg 90.00 30 Yu Sht [id number] Ran ([id number]) 0/0 15.00 MME Medicare  . Please send to [facility]. Thank you

## 2022-11-30 MED ORDER — HYDROCODONE-ACETAMINOPHEN 5-325 MG PO TABS: 1.0000 | ORAL_TABLET | Freq: Three times a day (TID) | ORAL | 0 refills | Status: DC | PRN

## 2022-12-07 ENCOUNTER — Encounter: Payer: Medicare Other | Attending: Physical Medicine & Rehabilitation | Admitting: Physical Medicine & Rehabilitation

## 2022-12-07 ENCOUNTER — Encounter: Payer: Self-pay | Admitting: Physical Medicine & Rehabilitation

## 2022-12-07 VITALS — BP 170/74 | HR 58 | Ht 62.0 in | Wt 125.4 lb

## 2022-12-07 DIAGNOSIS — M533 Sacrococcygeal disorders, not elsewhere classified: Secondary | ICD-10-CM

## 2022-12-07 DIAGNOSIS — Z79891 Long term (current) use of opiate analgesic: Secondary | ICD-10-CM

## 2022-12-07 DIAGNOSIS — M47817 Spondylosis without myelopathy or radiculopathy, lumbosacral region: Secondary | ICD-10-CM | POA: Diagnosis not present

## 2022-12-07 DIAGNOSIS — G894 Chronic pain syndrome: Secondary | ICD-10-CM

## 2022-12-07 DIAGNOSIS — M961 Postlaminectomy syndrome, not elsewhere classified: Secondary | ICD-10-CM | POA: Diagnosis not present

## 2022-12-07 MED ORDER — GABAPENTIN 100 MG PO CAPS: 100.0000 mg | ORAL_CAPSULE | Freq: Three times a day (TID) | ORAL | 2 refills | Status: DC

## 2022-12-07 NOTE — Progress Notes (Unsigned)
Subjective:    Ana Santos ID: Ana Santos, female    DOB: 01/10/1941, 82 y.o.   MRN: [id number]  HPI  HPI Ana Santos is a 82 y.o. year old female  who  has a past medical history of AN (acoustic neuroma) (HCC), Aneurysm (HCC), Anxiety, Bladder cancer (HCC), Cataract, Cigarette smoker, COPD (chronic obstructive pulmonary disease) (HCC), Coronary artery disease, Depression, Headache, Hyperlipidemia, Hypertension, Hypothyroidism, IBS (irritable bowel syndrome), Mitral valve prolapse, Osteopenia, Stroke (HCC), Venous insufficiency, and Vitamin D deficiency.   They are presenting to PM&R clinic as a new Ana Santos for pain management evaluation. They were referred for treatment of chronic lumbar and thoracic back pain.   She reports that she has had worsening pain for about 4 years.  No consistent pain shooting down her legs.  Pain is stabbing in quality.  Lifting anything makes it worse.  Denies any numbness or paresthesias in her limbs.  She had a prior left L1-2 hemilaminectomy and then later a L1-L2 TLIF in March 2023.  She is previously followed by Dr. Otelia Sergeant.  She is now being seen by Dr. Willia Craze orthopedic spine surgery.  She reports a history of bowel surgery with small intestine removed previously.   Red flag symptoms: Ana Santos denies saddle anesthesia, loss of bowel or bladder continence, new weakness, new numbness/tingling, or pain waking up at nighttime.   Medications tried: Tylenol, advil, lidcaine patch, icy hot with mild benefit She is currently on Plavix which limits her medication options Hydrocodone 10 mg previously helped control her pain  tramadol-this did help her pain however she had very severe withdrawal issues when she did not get her medication on time, would like to avoid this medication if possible Oxycodone helped her pain previously   Other treatments: PT/OT  made it worse Heat pad helps 10-15 ESI injections- reports minimal benefit  L1-2 hemilaminectomy  and then later a L1-L2 TLIF in March 2023.  Without sustained improvement   Interval history 06/27/2022 Ana Santos is here for follow-up regarding her chronic pain.  She continues to have lumbar and thoracic back pain.  She says the pain has been very severe since medications have been discontinued.  She did get some tramadol at the end of December which provided mild benefit to her pain.  She denies any use of other substances or marijuana since her last visit.  She says she regrets having her back surgery done last year.   Interval history 08/29/2022 Ana Santos is here for follow-up regarding her chronic back pain primarily in her lumbar and thoracic back.  She also recently had a left SI joint injection by Dr. Wynn Banker.  This provided short-term pain relief however the pain returned after a day or two.  Hydrocodone 5 mg has been helping her pain and makes it more tolerable.  She is not having any side effects of this medication.  She does report that hydrocodone does not last long enough and this leaves her in significant pain throughout the day.     Interval history 10/10/22 Ana Santos is here for follow-up regarding her chronic pain.  Pill counts appear to be consistent with her hydrocodone.  She is trying only uses medication when the pain is very severe.  No side effects with the hydrocodone reported.  The hydrocodone is helping her lower back pain remains tolerable.  She is also had some discomfort around her right inguinal area/anterior hip.  She says this started after doing some work in the yard.  She has occasional use heating pads for her lower back and found this to be helpful.  She has not tried TENS before.   Interval History 12/07/22 Ana Santos is here with her husband for follow-up regarding her chronic pain.  She continues to have severe lower back and thoracic back pain.  Back pain will radiate into her thighs.  She denies any recent changes in strength or sensation.  Hydrocodone  5 mg continues to help keep her pain controlled, she usually takes 1 in the morning, and will take another tablet later in the day if needed.  She has not had any recent falls.  No side effects with her medications.  Ana Santos reports she is able to be more active when using the hydrocodone and is able to get done more around her house.     Pain Inventory Average Pain 10 Pain Right Now 7 My pain is sharp, tingling, and aching  In the last 24 hours, has pain interfered with the following? General activity 8 Relation with others 5 Enjoyment of life 9 What TIME of day is your pain at its worst? morning  Sleep (in general) Poor  Pain is worse with: walking, bending, and some activites Pain improves with: rest, heat/ice, and medication Relief from Meds: 8  Family History  Problem Relation Age of Onset   Heart disease Mother    Cancer Mother        Lung   Heart attack Mother    Diabetes Mother    Cancer Father        Lung   Diabetes Sister    Cancer Sister        unkown type   Diabetes Brother    Lung cancer Brother        Lung   AAA (abdominal aortic aneurysm) Neg Hx    Colon cancer Neg Hx    Stomach cancer Neg Hx    Esophageal cancer Neg Hx    Liver cancer Neg Hx    Pancreatic cancer Neg Hx    Rectal cancer Neg Hx    Social History   Socioeconomic History   Marital status: Married    Spouse name: Windy Fast   Number of children: 5   Years of education: Not on file   Highest education level: Not on file  Occupational History   Occupation: alterations   Occupation: Retired    Comment: Neurosurgeon  Tobacco Use   Smoking status: Every Day    Packs/day: 0.25    Years: 50.00    Additional pack years: 0.00    Total pack years: 12.50    Types: Cigarettes   Smokeless tobacco: Never   Tobacco comments:    4-5 cigs daily  is aware she needs to quit  Vaping Use   Vaping Use: Never used  Substance and Sexual Activity   Alcohol use: No    Alcohol/week: 0.0 standard drinks  of alcohol   Drug use: Yes    Types: Hydrocodone   Sexual activity: Not Currently  Other Topics Concern   Not on file  Social History Narrative   Pt is married, 5 sons (4 living), 14 grandchildren, 4 great grandchildren.   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   Social Determinants of Corporate investment banker Strain: Low Risk  (07/31/2022)   Overall Financial Resource Strain (CARDIA)    Difficulty of Paying Living Expenses: Not hard at all  Food Insecurity: No Food Insecurity (07/31/2022)  Hunger Vital Sign    Worried About Running Out of Food in the Last Year: Never true    Ran Out of Food in the Last Year: Never true  Transportation Needs: No Transportation Needs (07/31/2022)   PRAPARE - Administrator, Civil Service (Medical): No    Lack of Transportation (Non-Medical): No  Physical Activity: Inactive (07/31/2022)   Exercise Vital Sign    Days of Exercise per Week: 0 days    Minutes of Exercise per Session: 0 min  Stress: No Stress Concern Present (07/31/2022)   [employer] of Occupational Health - Occupational Stress Questionnaire    Feeling of Stress : Not at all  Social Connections: Moderately Isolated (01/29/2020)   Social Connection and Isolation Panel [NHANES]    Frequency of Communication with Friends and Family: More than three times a week    Frequency of Social Gatherings with Friends and Family: Once a week    Attends Religious Services: Never    Database administrator or Organizations: No    Attends Banker Meetings: Never    Marital Status: Married   Past Surgical History:  Procedure Laterality Date   ANEURYSM COILING  04/2009   Dr. Corliss Skains   APPENDECTOMY     BACK SURGERY  02/28/2021   BACK SURGERY     08/2021   BREAST LUMPECTOMY Right 1986   benign   DIAGNOSTIC LAPAROSCOPY     Proable laparoscopic right colectomy 03-14-18 Dr. Johna Sheriff   EYE SURGERY Bilateral    ioc lens for  cataracts   LAPAROSCOPIC RIGHT COLECTOMY Right 03/14/2018   Procedure: LAPAROSCOPIC ASSISTED  RIGHT COLECTOMY;  Surgeon: Glenna Fellows, MD;  Location: [facility];  Service: General;  Laterality: Right;   LAPAROSCOPY N/A 03/14/2018   Procedure: LAPAROSCOPY;  Surgeon: Glenna Fellows, MD;  Location: [facility];  Service: General;  Laterality: N/A;   LEFT HEART CATHETERIZATION WITH CORONARY ANGIOGRAM N/A 08/05/2014   Procedure: LEFT HEART CATHETERIZATION WITH CORONARY ANGIOGRAM;  Surgeon: Iran Ouch, MD;  Location: [facility];  Service: Cardiovascular;  Laterality: N/A;   LUMBAR LAMINECTOMY N/A 02/28/2021   Procedure: LEFT L1-L2 MICRODISCECTOMY AND FASCIECTOMY;  Surgeon: Eldred Manges, MD;  Location: [facility];  Service: Orthopedics;  Laterality: N/A;   TONSILLECTOMY     TOTAL ABDOMINAL HYSTERECTOMY  1970   complete   TRANSLABYRINTHINE PROCEDURE  2002   [facility] Dr. Erroll Luna tumor removal   TRANSURETHRAL RESECTION OF BLADDER TUMOR N/A 02/08/2018   Procedure: TRANSURETHRAL RESECTION OF BLADDER TUMOR (TURBT)/ POSTOPERATIVE INSTILLATION OF CHEMO THERAPY;  Surgeon: Jerilee Field, MD;  Location: [facility];  Service: Urology;  Laterality: N/A;   Past Surgical History:  Procedure Laterality Date   ANEURYSM COILING  04/2009   Dr. Corliss Skains   APPENDECTOMY     BACK SURGERY  02/28/2021   BACK SURGERY     08/2021   BREAST LUMPECTOMY Right 1986   benign   DIAGNOSTIC LAPAROSCOPY     Proable laparoscopic right colectomy 03-14-18 Dr. Johna Sheriff   EYE SURGERY Bilateral    ioc lens for cataracts   LAPAROSCOPIC RIGHT COLECTOMY Right 03/14/2018   Procedure: LAPAROSCOPIC ASSISTED  RIGHT COLECTOMY;  Surgeon: Glenna Fellows, MD;  Location: [facility];  Service: General;  Laterality: Right;   LAPAROSCOPY N/A 03/14/2018   Procedure: LAPAROSCOPY;  Surgeon: Glenna Fellows, MD;  Location: [facility];  Service: General;  Laterality: N/A;   LEFT HEART CATHETERIZATION WITH CORONARY ANGIOGRAM N/A  08/05/2014   Procedure:  LEFT HEART CATHETERIZATION WITH CORONARY ANGIOGRAM;  Surgeon: Iran Ouch, MD;  [facility];  Service: Cardiovascular;  Laterality: N/A;   LUMBAR LAMINECTOMY N/A 02/28/2021   Procedure: LEFT L1-L2 MICRODISCECTOMY AND FASCIECTOMY;  Surgeon: Eldred Manges, MD;  Location: [facility];  Service: Orthopedics;  Laterality: N/A;   TONSILLECTOMY     TOTAL ABDOMINAL HYSTERECTOMY  1970   complete   TRANSLABYRINTHINE PROCEDURE  2002   [facility] Dr. Erroll Luna tumor removal   TRANSURETHRAL RESECTION OF BLADDER TUMOR N/A 02/08/2018   Procedure: TRANSURETHRAL RESECTION OF BLADDER TUMOR (TURBT)/ POSTOPERATIVE INSTILLATION OF CHEMO THERAPY;  Surgeon: Jerilee Field, MD;  Location: [facility];  Service: Urology;  Laterality: N/A;   Past Medical History:  Diagnosis Date   AN (acoustic neuroma) (HCC)    deafness in R ear   Aneurysm (HCC)    basilar tip aneursym s/p stent assisted coiling 04/26/09   Anxiety    Bladder cancer (HCC)    Cataract    Cigarette smoker    COPD (chronic obstructive pulmonary disease) (HCC)    mild, no inhalers or oxygen used   Coronary artery disease    no current cardiologist  pt. denies   Depression    Headache    tension headaches due to aneurysm repair with stent   Hyperlipidemia    Hypertension    Hypothyroidism    IBS (irritable bowel syndrome)    Mitral valve prolapse    mild takes beta blocker   Osteopenia    Stroke (HCC)    x 3 ministrokes last one feb 2014   Venous insufficiency    Vitamin D deficiency    BP (!) 170/74   Pulse (!) 58   Ht 5\' 2"  (1.575 m)   Wt 125 lb 6.4 oz (56.9 kg)   SpO2 99%   BMI 22.94 kg/m   Opioid Risk Score:   Fall Risk Score:  `1  Depression screen [facility] 2/9     12/07/2022   11:05 AM 10/10/2022    9:19 AM 09/07/2022    8:18 AM 08/29/2022    9:58 AM 07/31/2022    2:07 PM 07/21/2022   12:10 PM 05/26/2022    9:42 AM  Depression screen PHQ 2/9  Decreased Interest 0 1 3 0 0 1 0  Down,  Depressed, Hopeless 0 1 2 0 0 1 3  PHQ - 2 Score 0 2 5 0 0 2 3  Altered sleeping   1    0  Tired, decreased energy   3    3  Change in appetite   3    3  Feeling bad or failure about yourself    3    3  Trouble concentrating   3    0  Moving slowly or fidgety/restless   0    0  Suicidal thoughts   0    0  PHQ-9 Score   18    12  Difficult doing work/chores   Extremely dIfficult    Somewhat difficult   -  Review of Systems  Constitutional: Negative.   HENT: Negative.    Eyes: Negative.   Respiratory: Negative.    Cardiovascular: Negative.   Gastrointestinal: Negative.   Endocrine: Negative.   Genitourinary: Negative.   Musculoskeletal:  Positive for arthralgias and back pain.  Skin: Negative.   Allergic/Immunologic: Negative.   Neurological:        Tingling  Hematological: Negative.   Psychiatric/Behavioral: Negative.  All other systems reviewed and are negative.      Objective:   Physical Exam  Physical Exam Gen: no distress, normal appearing HEENT: oral mucosa pink and moist, NCAT Cardio: Reg rate Chest: normal effort, normal rate of breathing Abd: soft, non-distended Ext: no edema Psych: pleasant, appropriate  Skin: intact Neuro: Alert and awake, follows commands, cranial nerves II through XII grossly intact, speech and language normal Sensation to light touch in all 4 extremities Strength  5 out of 5 right lower extremity and hip flexion, knee extension, ankle PF and DF Strength 4+ out of 5 in left lower extremity hip flexion, knee extension, ankle PF and DF-she reports this is chronic since her prior surgery, unchanged Musculoskeletal: Slump negative b/l but resulted in back pain Tenderness to palpation left SI joint -unchanged No greater trochanter tenderness Mild T-spine paraspinal tenderness Facet loading negative   MRI T spine 05/01/22 FINDINGS: Alignment:  Physiologic.   Vertebrae: No acute fracture, evidence of discitis, or aggressive bone  lesion. T7 vertebral body hemangioma noted.   Cord:  Normal signal and morphology.   Paraspinal and other soft tissues: No acute paraspinal abnormality.   Disc levels:   Disc spaces: Degenerative disease with disc height loss at T10-11 and T11-12. Degenerative disease with disc height loss at T1-2. Partially visualized on the scout images is degenerative disease with disc height loss at C3-4, C4-5 and C5-6.   T1-T2: Mild broad-based disc bulge. Severe right and moderate left foraminal stenosis. No spinal stenosis.   T2-T3: No disc protrusion, foraminal stenosis or central canal stenosis.   T3-T4: No disc protrusion, foraminal stenosis or central canal stenosis.   T4-T5: No disc protrusion, foraminal stenosis or central canal stenosis.   T5-T6: No disc protrusion, foraminal stenosis or central canal stenosis.   T6-T7: No disc protrusion, foraminal stenosis or central canal stenosis.   T7-T8: No disc protrusion, foraminal stenosis or central canal stenosis.   T8-T9: No disc protrusion, foraminal stenosis or central canal stenosis.   T9-T10: No disc protrusion, foraminal stenosis or central canal stenosis.   T10-T11: Mild broad-based disc bulge. No foraminal or central canal stenosis. Right perineural cyst noted.   T11-T12: No disc protrusion, foraminal stenosis or central canal stenosis.   IMPRESSION: 1. At T1-2 there is a mild broad-based disc bulge. Severe right and moderate left foraminal stenosis. 2. No acute osseous injury of the thoracic spine.   L spine MRI 12/10/21 FINDINGS: Segmentation: Standard. Lowest well-formed disc space labeled the L5-S1 level.   Alignment: Up to 4 mm anterolisthesis of L5 on S1, stable. Trace anterolisthesis of L3 on L4, also relatively stable. Straightening of the normal lumbar lordosis elsewhere. Underlying sigmoid scoliosis.   Vertebrae: Susceptibility artifact from interval PLIF at L1-2. Vertebral body height maintained  without acute or chronic fracture. Bone marrow signal intensity within normal limits. Few small benign hemangiomata noted. No worrisome osseous lesions. Mild reactive marrow edema noted about the partially visualized T10-11 interspace. Reactive edema present about the right L5-S1 facet due to facet arthritis.   Conus medullaris and cauda equina: Conus extends to the L1-2 level. Conus and cauda equina appear normal.   Paraspinal and other soft tissues: Postoperative changes within the posterior paraspinous soft tissues without adverse features. Few scattered cysts noted about the partially visualized kidneys, similar to prior, likely benign. No follow-up imaging recommended.   Disc levels:   T11-12: Seen only on sagittal projection. Mild disc bulge with reactive endplate spurring. Left worse than right facet  hypertrophy. No significant spinal stenosis. Foramina appear patent.   T12-L1: Chronic endplate Schmorl's node deformities without significant disc bulge. Mild right-sided facet hypertrophy. No significant stenosis.   L1-2: Interval PLIF. No residual or recurrent spinal stenosis. Foramina appear patent.   L2-3: Disc bulge with disc desiccation and intervertebral disc space narrowing. Disc bulging eccentric to the left. Reactive endplate spurring. Moderate left with mild right facet arthrosis. Resultant mild narrowing of the left lateral recess. Mild left L2 foraminal narrowing. Right neural foramen remains patent. Appearance is stable.   L3-4: Degenerative intervertebral disc space narrowing with diffuse disc bulge and disc desiccation. Disc bulging eccentric to the left with left-sided reactive endplate spurring. Resultant extraforaminal disc osteophyte contacts the exiting left L3 nerve root as it courses of the left neural foramen (series 101, image 20). Moderate left with mild right facet arthrosis. Resultant mild narrowing of the left lateral recess. Mild left L3  foraminal stenosis. Right neural foramen remains patent. Appearance is stable.   L4-5: Disc bulge with disc desiccation. Reactive endplate spurring. Mild to moderate facet hypertrophy. No more than mild narrowing of the lateral recesses bilaterally. Mild to moderate right with mild left L4 foraminal stenosis. Appearance is stable.   L5-S1: Anterolisthesis. Disc desiccation with broad posterior disc bulge. Severe right-sided facet arthrosis with reactive marrow edema. Moderate left-sided foraminal narrowing. No significant spinal stenosis. Mild right L5 foraminal stenosis. Appearance is similar.   IMPRESSION: 1. Interval PLIF at L1-2 without residual or recurrent stenosis. 2. Multilevel degenerative spondylosis elsewhere throughout the lumbar spine with resultant mild left lateral recess and foraminal stenosis at L2-3 and L3-4, and mild to moderate bilateral foraminal narrowing at L4-5 and L5-S1 as above. 3. Advanced right-sided facet arthrosis at L5-S1 with associated reactive marrow edema, which could serve as a source for lower back pain.  T-spine          Assessment & Plan:    Chronic lumbar and thoracic back pain and failed back syndrome, I also suspect she has some SI joint dysfunction on the left -Hx of  L1-L2 TLIF -UDS and pain agreement completed prior visit -Recheck UDS -Continue hydrocodone at 5 mg dose Q4h PRN, no more than 3 doses a day #90 with each refill- call when low  -Pt seen by Dr. Wynn Banker for left SI joint injection- helped for few days -Pt has allergy noted to hydrocodone in chart-she insists this is not correct and she does not have this allergy -Zynex TENS and Heat/Cold therapy device ordered- reorder -Will start gabapentin 100mg  TID  -PT consulted    R anterior hip pain -Continue Voltaren gel -Consider imaging if doesn't improve   THC use history -Ana Santos reports she is not currently using -Continue random UDS  HTN -Advised follow-up with  PCP

## 2022-12-08 ENCOUNTER — Encounter: Payer: Self-pay | Admitting: Family Medicine

## 2022-12-08 ENCOUNTER — Ambulatory Visit (INDEPENDENT_AMBULATORY_CARE_PROVIDER_SITE_OTHER): Payer: Medicare Other | Admitting: Family Medicine

## 2022-12-08 VITALS — BP 122/70 | HR 62 | Temp 97.6°F | Ht 62.0 in | Wt 125.2 lb

## 2022-12-08 DIAGNOSIS — E039 Hypothyroidism, unspecified: Secondary | ICD-10-CM | POA: Diagnosis not present

## 2022-12-08 DIAGNOSIS — I10 Essential (primary) hypertension: Secondary | ICD-10-CM | POA: Diagnosis not present

## 2022-12-08 DIAGNOSIS — E782 Mixed hyperlipidemia: Secondary | ICD-10-CM

## 2022-12-08 DIAGNOSIS — M961 Postlaminectomy syndrome, not elsewhere classified: Secondary | ICD-10-CM

## 2022-12-08 LAB — BASIC METABOLIC PANEL
BUN: 26 mg/dL — ABNORMAL HIGH (ref 6–23)
CO2: 23 mEq/L (ref 19–32)
Calcium: 9.8 mg/dL (ref 8.4–10.5)
Chloride: 108 mEq/L (ref 96–112)
Creatinine, Ser: 0.96 mg/dL (ref 0.40–1.20)
GFR: 55.33 mL/min — ABNORMAL LOW (ref >60.00)
Glucose, Bld: 71 mg/dL (ref 70–99)
Potassium: 4.1 mEq/L (ref 3.5–5.1)
Sodium: 140 mEq/L (ref 135–145)

## 2022-12-08 LAB — LIPID PANEL
Cholesterol: 182 mg/dL (ref 0–200)
HDL: 48.5 mg/dL (ref >39.00)
LDL Cholesterol: 102 mg/dL — ABNORMAL HIGH (ref 0–99)
NonHDL: 133.3
Total CHOL/HDL Ratio: 4
Triglycerides: 158 mg/dL — ABNORMAL HIGH (ref 0.0–149.0)
VLDL: 31.6 mg/dL (ref 0.0–40.0)

## 2022-12-08 LAB — TSH: TSH: 24.44 u[IU]/mL — ABNORMAL HIGH (ref 0.35–5.50)

## 2022-12-08 MED ORDER — LEVOTHYROXINE SODIUM 25 MCG PO TABS
12.5000 ug | ORAL_TABLET | Freq: Every day | ORAL | 3 refills | Status: AC
Start: 2022-12-08 — End: ?

## 2022-12-08 NOTE — Assessment & Plan Note (Signed)
TSH was low at last visit. I will reassess her TSH today to make sure this is back to goal. Continue levothyroxine 50 mcg daily.

## 2022-12-08 NOTE — Addendum Note (Signed)
Addended by: Loyola Mast on: 12/08/2022 03:06 PM   Modules accepted: Orders

## 2022-12-08 NOTE — Assessment & Plan Note (Signed)
Blood pressure is in good control. Continue amlodipine 5 mg daily. 

## 2022-12-08 NOTE — Progress Notes (Signed)
[facility] [address] [place] [address] Dept: [phone] Dept Fax: [phone]  Chronic Care Office Visit  Subjective:    Patient ID: Ana Santos, female    DOB: 05-01-1941, 82 y.o..   MRN: [id number]  Chief Complaint  Patient presents with   Medical Management of Chronic Issues    3 month f/u.  Marland Kitchen   History of Present Illness:  Patient is in today for reassessment of chronic medical issues.  Ana Santos has a history of hypertension, managed with amlodipine 5 mg daily. She also has hyperlipidemia, which is managed with pravastatin 40 mg daily.   Ana Santos has a history of hypothyroidism. She is managed on levothyroxine 50 mcg daily. We reduced her dose at her last appointment, as her TSH was low.     Ana Santos has a history of a fall in Sept. 2021. An MRI scan in may showed multi-level degenerative lumbar disease with foraminal stenoses. She had multiple ESIs without improvement. She had a left L1-L2 microdiscectomy on 02/28/2021. She had a lumbar fusion on 08/23/2021 due to recurrent disc herniation s/p microdiscectomy. She has now had further ESIs. As no other therapy had been successful, she is now being seen by pain management. She is being managed on Vicodin 5-325 mg TID. She notes she is having progressive difficulty getting around. she has to have her husband drop her off closer tot he grocery store door or near a shopping cart. Despite this, she pushes through to do gardenign and other activities.   Ana Santos has anxiety with depression. Her chronic pain has made this difficult. She is managed on sertraline 50 mg daily.  Past Medical History: Patient Active Problem List   Diagnosis Date Noted   Oxygen desaturation 08/24/2021   Other secondary scoliosis, lumbar region 08/23/2021    Class: Chronic   Fusion of spine of lumbar region 08/23/2021   Lumbar post-laminectomy syndrome 06/15/2021   Status post  lumbar microdiscectomy 03/09/2021   Recurrent herniation of lumbar disc 02/28/2021   Foraminal stenosis due to intervertebral disc disease 01/21/2021   History of bladder cancer 01/21/2021   Labyrinthitis 12/08/2019   Left-sided low back pain without sciatica 05/05/2019   Visual loss, transient, bilateral 02/21/2019   Essential hypertension 02/21/2019   Colonic mass s/p lap ileocectomy 03/14/2018 03/14/2018   Acute lower GI bleeding 03/14/2018   Chronic anticoagulation 03/14/2018   Chronic tension-type headache, not intractable 05/31/2015   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) 03/11/2014   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 Corliss Skains   APPENDECTOMY     BACK SURGERY  02/28/2021   BACK SURGERY     08/2021   BREAST LUMPECTOMY Right 1986   benign   DIAGNOSTIC LAPAROSCOPY     Proable laparoscopic right colectomy 03-14-18 Dr. Johna Sheriff   EYE SURGERY Bilateral    ioc lens for cataracts   LAPAROSCOPIC RIGHT COLECTOMY Right 03/14/2018   Procedure: LAPAROSCOPIC  ASSISTED  RIGHT COLECTOMY;  Surgeon: Glenna Fellows, MD;  Location: [facility];  Service: General;  Laterality: Right;   LAPAROSCOPY N/A 03/14/2018   Procedure: LAPAROSCOPY;  Surgeon: Glenna Fellows, MD;  Location: [facility];  Service: General;  Laterality: N/A;   LEFT HEART CATHETERIZATION WITH CORONARY ANGIOGRAM N/A 08/05/2014   Procedure: LEFT HEART  CATHETERIZATION WITH CORONARY ANGIOGRAM;  Surgeon: Iran Ouch, MD;  Location: [facility];  Service: Cardiovascular;  Laterality: N/A;   LUMBAR LAMINECTOMY N/A 02/28/2021   Procedure: LEFT L1-L2 MICRODISCECTOMY AND FASCIECTOMY;  Surgeon: Eldred Manges, MD;  Location: [facility] OR;  Service: Orthopedics;  Laterality: N/A;   TONSILLECTOMY     TOTAL ABDOMINAL HYSTERECTOMY  1970   complete   TRANSLABYRINTHINE PROCEDURE  2002   [facility] Dr. Erroll Luna tumor removal   TRANSURETHRAL RESECTION OF BLADDER TUMOR N/A 02/08/2018   Procedure: TRANSURETHRAL RESECTION OF BLADDER TUMOR (TURBT)/ POSTOPERATIVE INSTILLATION OF CHEMO THERAPY;  Surgeon: Jerilee Field, MD;  [facility]: [facility];  Service: Urology;  Laterality: N/A;   Family History  Problem Relation Age of Onset   Heart disease Mother    Cancer Mother        Lung   Heart attack Mother    Diabetes Mother    Cancer Father        Lung   Diabetes Sister    Cancer Sister        unkown type   Diabetes Brother    Lung cancer Brother        Lung   AAA (abdominal aortic aneurysm) Neg Hx    Colon cancer Neg Hx    Stomach cancer Neg Hx    Esophageal cancer Neg Hx    Liver cancer Neg Hx    Pancreatic cancer Neg Hx    Rectal cancer Neg Hx    Outpatient Medications Prior to Visit  Medication Sig Dispense Refill   amLODipine (NORVASC) 5 MG tablet TAKE 1 TABLET DAILY 90 tablet 3   Cholecalciferol (VITAMIN D-3) 125 MCG (5000 UT) TABS Take 5,000 Units by mouth daily.     clopidogrel (PLAVIX) 75 MG tablet TAKE 1 TABLET DAILY 90 tablet 3   diclofenac Sodium (VOLTAREN ARTHRITIS PAIN) 1 % GEL Apply 4 g topically 4 (four) times daily. 150 g 3   gabapentin (NEURONTIN) 100 MG capsule Take 1 capsule (100 mg total) by mouth 3 (three) times daily. 90 capsule 2   HYDROcodone-acetaminophen (NORCO/VICODIN) 5-325 MG tablet Take 1 tablet by mouth every 8 (eight) hours as needed for moderate pain. 90 tablet 0   hydroxypropyl methylcellulose /  hypromellose (ISOPTO TEARS / GONIOVISC) 2.5 % ophthalmic solution Place 1 drop into both eyes at bedtime.     levothyroxine (SYNTHROID) 50 MCG tablet Take 1 tablet (50 mcg total) by mouth daily. 90 tablet 3   pramipexole (MIRAPEX) 0.5 MG tablet Take one nightly one hour prior to going to bed as needed for restless leg syndrome. 90 tablet 4   pravastatin (PRAVACHOL) 40 MG tablet TAKE 1 TABLET DAILY 90 tablet 3   sertraline (ZOLOFT) 50 MG tablet Take 1.5 tablets (75 mg total) by mouth daily. 135 tablet 3   SYNTHROID 75 MCG tablet Take 75 mcg by mouth daily.     No facility-administered medications prior to visit.   Allergies  Allergen Reactions   Prednisone Other (See Comments)    Hallucinations   Atorvastatin Other (See Comments)     Lipitor caused arm pain (3/09)   Hydrocodone-Acetaminophen  Other (See Comments)    hallucinations   Morphine Other (See Comments)    hallucinations   Nortriptyline Nausea And Vomiting    Caused nausea and vomiting and shaking, kept her up all night   Topamax [Topiramate] Nausea And Vomiting    *dizziness*   Tramadol Other (See Comments)    withdrawal   Objective:   Today's Vitals   12/08/22 0750  BP: 122/70  Pulse: 62  Temp: 97.6 F (36.4 C)  TempSrc: Temporal  SpO2: 100%  Weight: 125 lb 3.2 oz (56.8 kg)  Height: 5\' 2"  (1.575 m)   Body mass index is 22.9 kg/m.   General: Well developed, well nourished. No acute distress. Psych: Alert and oriented. Normal mood and affect.  Health Maintenance Due  Topic Date Due   DTaP/Tdap/Td (1 - Tdap) Never done   Zoster Vaccines- Shingrix (1 of 2) Never done     Assessment & Plan:   Problem List Items Addressed This Visit       Cardiovascular and Mediastinum   Essential hypertension - Primary    Blood pressure is in good control. Continue amlodipine 5 mg daily.       Relevant Orders   Basic metabolic panel     Endocrine   Hypothyroidism    TSH was low at last visit. I will reassess her  TSH today to make sure this is back to goal. Continue levothyroxine 50 mcg daily.      Relevant Orders   TSH     Other   Mixed hyperlipidemia    I will check lipids today. Continue pravastatin 40 mg daily.      Relevant Orders   Lipid panel   Lumbar post-laminectomy syndrome    Working with pain management. I will complete a form for Ms. Strack to obtain a disabled parking placard.       Return in about 3 months (around 03/10/2023) for Reassessment.   Loyola Mast, MD

## 2022-12-08 NOTE — Assessment & Plan Note (Signed)
I will check lipids today. Continue pravastatin 40 mg daily.

## 2022-12-08 NOTE — Assessment & Plan Note (Signed)
Working with pain management. I will complete a form for Ms. Nobel to obtain a disabled parking placard.

## 2022-12-11 ENCOUNTER — Ambulatory Visit: Payer: Medicare Other | Admitting: Physical Medicine & Rehabilitation

## 2022-12-15 DIAGNOSIS — C672 Malignant neoplasm of lateral wall of bladder: Secondary | ICD-10-CM | POA: Diagnosis not present

## 2022-12-21 ENCOUNTER — Other Ambulatory Visit: Payer: Self-pay | Admitting: Family Medicine

## 2022-12-21 DIAGNOSIS — E782 Mixed hyperlipidemia: Secondary | ICD-10-CM

## 2022-12-22 ENCOUNTER — Other Ambulatory Visit: Payer: Self-pay | Admitting: Urology

## 2022-12-22 ENCOUNTER — Telehealth: Payer: Self-pay | Admitting: Family Medicine

## 2022-12-22 MED ORDER — GEMCITABINE CHEMO FOR BLADDER INSTILLATION 2000 MG: 2000.0000 mg | Freq: Once | INTRAVENOUS | Status: AC

## 2022-12-22 NOTE — Telephone Encounter (Signed)
Lft VM to rtn call. Dm/cma  

## 2022-12-22 NOTE — Telephone Encounter (Signed)
Ana Santos from [facility] - [phone] - ext [phone]. She called requesting for surgical clearance. Surgery is scheduled for Aug 9 for bladder biopsy and she needs to see if she can hold Plavix for 5 days prior.

## 2022-12-27 NOTE — Telephone Encounter (Signed)
 Lft VM to rtn call. Dm/cma  

## 2022-12-28 NOTE — Progress Notes (Addendum)
Anesthesia Review:  PCP: Herbie Drape LOV 12/08/22  Cardiologist : none  Chest x-ray : EKG : 01/02/23  Echo : Stress test: Cardiac Cath :  Activity level: can do a flight of stairs without difficutly  Sleep Study/ CPAP : none  Fasting Blood Sugar :      / Checks Blood Sugar -- times a day:   Blood Thinner/ Instructions /Last Dose: ASA / Instructions/ Last Dose :    Plavix instructoins from DR Veto Kemps on chart .

## 2022-12-28 NOTE — Patient Instructions (Signed)
SURGICAL WAITING ROOM VISITATION  Patients having surgery or a procedure may have no more than 2 support people in the waiting area - these visitors may rotate.    Children under the age of 72 must have an adult with them who is not the patient.  Due to an increase in RSV and influenza rates and associated hospitalizations, children ages 35 and under may not visit patients in [facility] hospitals.  If the patient needs to stay at the hospital during part of their recovery, the visitor guidelines for inpatient rooms apply. Pre-op nurse will coordinate an appropriate time for 1 support person to accompany patient in pre-op.  This support person may not rotate.    Please refer to the [facility] website for the visitor guidelines for Inpatients (after your surgery is over and you are in a regular room).       Your procedure is scheduled on:  01/19/2023    Report to [facility] Main Entrance    Report to admitting at   [phone]   Call this number if you have problems the morning of surgery [phone]   Do not eat food  or drink liquids :After Midnight.                              If you have questions, please contact your surgeon's office.       Oral Hygiene is also important to reduce your risk of infection.                                    Remember - BRUSH YOUR TEETH THE MORNING OF SURGERY WITH YOUR REGULAR TOOTHPASTE  DENTURES WILL BE REMOVED PRIOR TO SURGERY PLEASE DO NOT APPLY "Poly grip" OR ADHESIVES!!!   Do NOT smoke after Midnight   Take these medicines the morning of surgery with A SIP OF WATER:   DO NOT TAKE ANY ORAL DIABETIC MEDICATIONS DAY OF YOUR SURGERY  Bring CPAP mask and tubing day of surgery.                              You may not have any metal on your body including hair pins, jewelry, and body piercing             Do not wear make-up, lotions, powders, perfumes/cologne, or deodorant  Do not wear nail polish including gel and S&S,  artificial/acrylic nails, or any other type of covering on natural nails including finger and toenails. If you have artificial nails, gel coating, etc. that needs to be removed by a nail salon please have this removed prior to surgery or surgery may need to be canceled/ delayed if the surgeon/ anesthesia feels like they are unable to be safely monitored.   Do not shave  48 hours prior to surgery.               Men may shave face and neck.   Do not bring valuables to the hospital. [facility] IS NOT             RESPONSIBLE   FOR VALUABLES.   Contacts, glasses, dentures or bridgework may not be worn into surgery.   Bring small overnight bag day of surgery.   DO NOT BRING YOUR HOME MEDICATIONS TO THE HOSPITAL. PHARMACY WILL  DISPENSE MEDICATIONS LISTED ON YOUR MEDICATION LIST TO YOU DURING YOUR ADMISSION IN THE HOSPITAL!    Patients discharged on the day of surgery will not be allowed to drive home.  Someone NEEDS to stay with you for the first 24 hours after anesthesia.   Special Instructions: Bring a copy of your healthcare power of attorney and living will documents the day of surgery if you haven't scanned them before.              Please read over the following fact sheets you were given: IF YOU HAVE QUESTIONS ABOUT YOUR PRE-OP INSTRUCTIONS PLEASE CALL [phone]   If you received a COVID test during your pre-op visit  it is requested that you wear a mask when out in public, stay away from anyone that may not be feeling well and notify your surgeon if you develop symptoms. If you test positive for Covid or have been in contact with anyone that has tested positive in the last 10 days please notify you surgeon.    [facility] - Preparing for Surgery Before surgery, you can play an important role.  Because skin is not sterile, your skin needs to be as free of germs as possible.  You can reduce the number of germs on your skin by washing with CHG (chlorahexidine gluconate) soap before  surgery.  CHG is an antiseptic cleaner which kills germs and bonds with the skin to continue killing germs even after washing. Please DO NOT use if you have an allergy to CHG or antibacterial soaps.  If your skin becomes reddened/irritated stop using the CHG and inform your nurse when you arrive at Short Stay. Do not shave (including legs and underarms) for at least 48 hours prior to the first CHG shower.  You may shave your face/neck. Please follow these instructions carefully:  1.  Shower with CHG Soap the night before surgery and the  morning of Surgery.  2.  If you choose to wash your hair, wash your hair first as usual with your  normal  shampoo.  3.  After you shampoo, rinse your hair and body thoroughly to remove the  shampoo.                           4.  Use CHG as you would any other liquid soap.  You can apply chg directly  to the skin and wash                       Gently with a scrungie or clean washcloth.  5.  Apply the CHG Soap to your body ONLY FROM THE NECK DOWN.   Do not use on face/ open                           Wound or open sores. Avoid contact with eyes, ears mouth and genitals (private parts).                       Wash face,  Genitals (private parts) with your normal soap.             6.  Wash thoroughly, paying special attention to the area where your surgery  will be performed.  7.  Thoroughly rinse your body with warm water from the neck down.  8.  DO NOT shower/wash with your normal soap after using and  rinsing off  the CHG Soap.                9.  Pat yourself dry with a clean towel.            10.  Wear clean pajamas.            11.  Place clean sheets on your bed the night of your first shower and do not  sleep with pets. Day of Surgery : Do not apply any lotions/deodorants the morning of surgery.  Please wear clean clothes to the hospital/surgery center.  FAILURE TO FOLLOW THESE INSTRUCTIONS MAY RESULT IN THE CANCELLATION OF YOUR SURGERY PATIENT  SIGNATURE_________________________________  NURSE SIGNATURE__________________________________  ________________________________________________________________________

## 2023-01-01 DIAGNOSIS — M25552 Pain in left hip: Secondary | ICD-10-CM | POA: Diagnosis not present

## 2023-01-02 ENCOUNTER — Encounter (HOSPITAL_COMMUNITY): Payer: Self-pay

## 2023-01-02 ENCOUNTER — Encounter (HOSPITAL_COMMUNITY)
Admission: RE | Admit: 2023-01-02 | Discharge: 2023-01-02 | Disposition: A | Discharge status: Home / Self Care | Payer: Medicare Other | Source: Ambulatory Visit | Attending: Urology | Admitting: Urology

## 2023-01-02 ENCOUNTER — Other Ambulatory Visit: Payer: Self-pay

## 2023-01-02 VITALS — BP 158/69 | HR 61 | Temp 98.3°F | Resp 16 | Ht 62.0 in | Wt 164.0 lb

## 2023-01-02 DIAGNOSIS — R9431 Abnormal electrocardiogram [ECG] [EKG]: Secondary | ICD-10-CM | POA: Insufficient documentation

## 2023-01-02 DIAGNOSIS — Z01812 Encounter for preprocedural laboratory examination: Secondary | ICD-10-CM | POA: Insufficient documentation

## 2023-01-02 DIAGNOSIS — Z01818 Encounter for other preprocedural examination: Secondary | ICD-10-CM

## 2023-01-02 DIAGNOSIS — Z01810 Encounter for preprocedural cardiovascular examination: Secondary | ICD-10-CM | POA: Insufficient documentation

## 2023-01-02 HISTORY — DX: Unspecified osteoarthritis, unspecified site: M19.90

## 2023-01-02 LAB — BASIC METABOLIC PANEL
Anion gap: 9 (ref 5–15)
BUN: 21 mg/dL (ref 8–23)
CO2: 21 mmol/L — ABNORMAL LOW (ref 22–32)
Calcium: 8.8 mg/dL — ABNORMAL LOW (ref 8.9–10.3)
Chloride: 108 mmol/L (ref 98–111)
Creatinine, Ser: 0.91 mg/dL (ref 0.44–1.00)
GFR, Estimated: >60 mL/min (ref >60)
Glucose, Bld: 81 mg/dL (ref 70–99)
Potassium: 3.7 mmol/L (ref 3.5–5.1)
Sodium: 138 mmol/L (ref 135–145)

## 2023-01-02 LAB — CBC
HCT: 39.1 % (ref 36.0–46.0)
Hemoglobin: 11.7 g/dL — ABNORMAL LOW (ref 12.0–15.0)
MCH: 29.2 pg (ref 26.0–34.0)
MCHC: 29.9 g/dL — ABNORMAL LOW (ref 30.0–36.0)
MCV: 97.5 fL (ref 80.0–100.0)
Platelets: 307 10*3/uL (ref 150–400)
RBC: 4.01 MIL/uL (ref 3.87–5.11)
RDW: 14.4 % (ref 11.5–15.5)
WBC: 10.9 10*3/uL — ABNORMAL HIGH (ref 4.0–10.5)
nRBC: 0 % (ref 0.0–0.2)

## 2023-01-03 DIAGNOSIS — M25552 Pain in left hip: Secondary | ICD-10-CM | POA: Diagnosis not present

## 2023-01-03 DIAGNOSIS — M5459 Other low back pain: Secondary | ICD-10-CM | POA: Diagnosis not present

## 2023-01-03 DIAGNOSIS — M461 Sacroiliitis, not elsewhere classified: Secondary | ICD-10-CM | POA: Diagnosis not present

## 2023-01-08 ENCOUNTER — Telehealth: Payer: Self-pay

## 2023-01-08 DIAGNOSIS — M5459 Other low back pain: Secondary | ICD-10-CM | POA: Diagnosis not present

## 2023-01-08 DIAGNOSIS — M25552 Pain in left hip: Secondary | ICD-10-CM | POA: Diagnosis not present

## 2023-01-08 DIAGNOSIS — M461 Sacroiliitis, not elsewhere classified: Secondary | ICD-10-CM | POA: Diagnosis not present

## 2023-01-09 MED ORDER — HYDROCODONE-ACETAMINOPHEN 5-325 MG PO TABS: 1.0000 | ORAL_TABLET | Freq: Three times a day (TID) | ORAL | 0 refills | Status: DC | PRN

## 2023-01-11 ENCOUNTER — Telehealth: Payer: Self-pay | Admitting: Family Medicine

## 2023-01-11 NOTE — Telephone Encounter (Signed)
Error

## 2023-01-11 NOTE — Telephone Encounter (Signed)
[facility] Ana Santos [phone] ex [phone]  They need the surgical clearance for her surgery on 8/9 She needs to stop her plavix 5 days before surgery, They will fax a form.

## 2023-01-11 NOTE — Telephone Encounter (Deleted)
[facility] Marisue Ivan [phone]   They need clarification on thr request for service before they can proceed.

## 2023-01-15 NOTE — Telephone Encounter (Signed)
Task completed

## 2023-01-15 NOTE — Telephone Encounter (Signed)
Form faxed to [facility] on 01/12/23.  Dm/cma

## 2023-01-19 ENCOUNTER — Encounter (HOSPITAL_COMMUNITY)
Admission: RE | Disposition: A | Discharge status: Home / Self Care | Payer: Self-pay | Source: Ambulatory Visit | Attending: Urology

## 2023-01-19 ENCOUNTER — Other Ambulatory Visit: Payer: Self-pay

## 2023-01-19 ENCOUNTER — Ambulatory Visit (HOSPITAL_COMMUNITY): Payer: Medicare Other

## 2023-01-19 ENCOUNTER — Ambulatory Visit (HOSPITAL_BASED_OUTPATIENT_CLINIC_OR_DEPARTMENT_OTHER): Payer: Medicare Other | Admitting: Physician Assistant

## 2023-01-19 ENCOUNTER — Encounter (HOSPITAL_COMMUNITY): Payer: Self-pay | Admitting: Urology

## 2023-01-19 ENCOUNTER — Ambulatory Visit (HOSPITAL_COMMUNITY)
Admission: RE | Admit: 2023-01-19 | Discharge: 2023-01-19 | Disposition: A | Discharge status: Home / Self Care | Payer: Medicare Other | Source: Ambulatory Visit | Attending: Urology | Admitting: Urology

## 2023-01-19 ENCOUNTER — Ambulatory Visit (HOSPITAL_COMMUNITY): Payer: Medicare Other | Admitting: Physician Assistant

## 2023-01-19 DIAGNOSIS — M797 Fibromyalgia: Secondary | ICD-10-CM | POA: Diagnosis not present

## 2023-01-19 DIAGNOSIS — C676 Malignant neoplasm of ureteric orifice: Secondary | ICD-10-CM | POA: Insufficient documentation

## 2023-01-19 DIAGNOSIS — Z7902 Long term (current) use of antithrombotics/antiplatelets: Secondary | ICD-10-CM | POA: Insufficient documentation

## 2023-01-19 DIAGNOSIS — J449 Chronic obstructive pulmonary disease, unspecified: Secondary | ICD-10-CM | POA: Diagnosis not present

## 2023-01-19 DIAGNOSIS — F17210 Nicotine dependence, cigarettes, uncomplicated: Secondary | ICD-10-CM | POA: Insufficient documentation

## 2023-01-19 DIAGNOSIS — I10 Essential (primary) hypertension: Secondary | ICD-10-CM | POA: Diagnosis not present

## 2023-01-19 DIAGNOSIS — E782 Mixed hyperlipidemia: Secondary | ICD-10-CM | POA: Diagnosis not present

## 2023-01-19 DIAGNOSIS — Z01818 Encounter for other preprocedural examination: Secondary | ICD-10-CM

## 2023-01-19 DIAGNOSIS — I341 Nonrheumatic mitral (valve) prolapse: Secondary | ICD-10-CM | POA: Diagnosis not present

## 2023-01-19 DIAGNOSIS — Z87442 Personal history of urinary calculi: Secondary | ICD-10-CM | POA: Diagnosis not present

## 2023-01-19 DIAGNOSIS — D494 Neoplasm of unspecified behavior of bladder: Secondary | ICD-10-CM | POA: Diagnosis not present

## 2023-01-19 DIAGNOSIS — J42 Unspecified chronic bronchitis: Secondary | ICD-10-CM

## 2023-01-19 DIAGNOSIS — D414 Neoplasm of uncertain behavior of bladder: Secondary | ICD-10-CM

## 2023-01-19 DIAGNOSIS — C679 Malignant neoplasm of bladder, unspecified: Secondary | ICD-10-CM | POA: Diagnosis not present

## 2023-01-19 DIAGNOSIS — F418 Other specified anxiety disorders: Secondary | ICD-10-CM | POA: Diagnosis not present

## 2023-01-19 HISTORY — PX: CYSTOSCOPY WITH BIOPSY: SHX5122

## 2023-01-19 HISTORY — PX: CYSTOSCOPY W/ RETROGRADES: SHX1426

## 2023-01-19 HISTORY — PX: CYSTOSCOPY WITH FULGERATION: SHX6638

## 2023-01-19 SURGERY — CYSTOSCOPY, WITH BIOPSY
Anesthesia: General | Site: Bladder

## 2023-01-19 MED ORDER — CHLORHEXIDINE GLUCONATE 0.12 % MT SOLN
15.0000 mL | Freq: Once | OROMUCOSAL | Status: AC
  Administered 2023-01-19: 15 mL via OROMUCOSAL

## 2023-01-19 MED ORDER — PHENYLEPHRINE 80 MCG/ML (10ML) SYRINGE FOR IV PUSH (FOR BLOOD PRESSURE SUPPORT)
PREFILLED_SYRINGE | INTRAVENOUS | Status: DC | PRN
  Administered 2023-01-19 (×3): 80 ug via INTRAVENOUS

## 2023-01-19 MED ORDER — FENTANYL CITRATE (PF) 100 MCG/2ML IJ SOLN
INTRAMUSCULAR | Status: DC | PRN
  Administered 2023-01-19 (×2): 50 ug via INTRAVENOUS

## 2023-01-19 MED ORDER — ONDANSETRON HCL 4 MG/2ML IJ SOLN: 4.0000 mg | Freq: Once | INTRAMUSCULAR | Status: DC | PRN

## 2023-01-19 MED ORDER — ACETAMINOPHEN 10 MG/ML IV SOLN: 1000.0000 mg | Freq: Once | INTRAVENOUS | Status: DC | PRN

## 2023-01-19 MED ORDER — ONDANSETRON HCL 4 MG/2ML IJ SOLN
INTRAMUSCULAR | Status: DC | PRN
  Administered 2023-01-19: 4 mg via INTRAVENOUS

## 2023-01-19 MED ORDER — GEMCITABINE CHEMO FOR BLADDER INSTILLATION 2000 MG
2000.0000 mg | Freq: Once | INTRAVENOUS | Status: AC
  Administered 2023-01-19: 2000 mg via INTRAVESICAL
  Filled 2023-01-19: qty 2000

## 2023-01-19 MED ORDER — LIDOCAINE HCL (PF) 2 % IJ SOLN
INTRAMUSCULAR | Status: AC
  Filled 2023-01-19: qty 5

## 2023-01-19 MED ORDER — PHENYLEPHRINE 80 MCG/ML (10ML) SYRINGE FOR IV PUSH (FOR BLOOD PRESSURE SUPPORT)
PREFILLED_SYRINGE | INTRAVENOUS | Status: AC
  Filled 2023-01-19: qty 10

## 2023-01-19 MED ORDER — DEXAMETHASONE SODIUM PHOSPHATE 10 MG/ML IJ SOLN
INTRAMUSCULAR | Status: AC
  Filled 2023-01-19: qty 1

## 2023-01-19 MED ORDER — LACTATED RINGERS IV SOLN: INTRAVENOUS | Status: DC

## 2023-01-19 MED ORDER — EPHEDRINE SULFATE-NACL 50-0.9 MG/10ML-% IV SOSY
PREFILLED_SYRINGE | INTRAVENOUS | Status: DC | PRN
  Administered 2023-01-19 (×2): 5 mg via INTRAVENOUS

## 2023-01-19 MED ORDER — DEXAMETHASONE SODIUM PHOSPHATE 10 MG/ML IJ SOLN
INTRAMUSCULAR | Status: DC | PRN
  Administered 2023-01-19: 4 mg via INTRAVENOUS

## 2023-01-19 MED ORDER — CEFAZOLIN SODIUM-DEXTROSE 2-4 GM/100ML-% IV SOLN
2.0000 g | INTRAVENOUS | Status: AC
  Administered 2023-01-19: 2 g via INTRAVENOUS
  Filled 2023-01-19: qty 100

## 2023-01-19 MED ORDER — FENTANYL CITRATE PF 50 MCG/ML IJ SOSY: 25.0000 ug | PREFILLED_SYRINGE | INTRAMUSCULAR | Status: DC | PRN

## 2023-01-19 MED ORDER — LIDOCAINE 2% (20 MG/ML) 5 ML SYRINGE
INTRAMUSCULAR | Status: DC | PRN
  Administered 2023-01-19: 60 mg via INTRAVENOUS

## 2023-01-19 MED ORDER — 0.9 % SODIUM CHLORIDE (POUR BTL) OPTIME
TOPICAL | Status: DC | PRN
  Administered 2023-01-19: 1000 mL

## 2023-01-19 MED ORDER — ONDANSETRON HCL 4 MG/2ML IJ SOLN
INTRAMUSCULAR | Status: AC
  Filled 2023-01-19: qty 2

## 2023-01-19 MED ORDER — PROPOFOL 10 MG/ML IV BOLUS
INTRAVENOUS | Status: AC
  Filled 2023-01-19: qty 20

## 2023-01-19 MED ORDER — PROPOFOL 10 MG/ML IV BOLUS
INTRAVENOUS | Status: DC | PRN
Start: 2023-01-19 — End: 2023-01-19
  Administered 2023-01-19: 100 mg via INTRAVENOUS

## 2023-01-19 MED ORDER — ORAL CARE MOUTH RINSE: 15.0000 mL | Freq: Once | OROMUCOSAL | Status: AC

## 2023-01-19 MED ORDER — EPHEDRINE 5 MG/ML INJ
INTRAVENOUS | Status: AC
  Filled 2023-01-19: qty 5

## 2023-01-19 MED ORDER — FENTANYL CITRATE (PF) 100 MCG/2ML IJ SOLN
INTRAMUSCULAR | Status: AC
  Filled 2023-01-19: qty 2

## 2023-01-19 MED ORDER — IOHEXOL 300 MG/ML  SOLN
INTRAMUSCULAR | Status: DC | PRN
  Administered 2023-01-19: 13 mL

## 2023-01-19 MED ORDER — AMISULPRIDE (ANTIEMETIC) 5 MG/2ML IV SOLN: 10.0000 mg | Freq: Once | INTRAVENOUS | Status: DC | PRN

## 2023-01-19 SURGICAL SUPPLY — 20 items
BAG DRN RND TRDRP ANRFLXCHMBR (UROLOGICAL SUPPLIES)
BAG URINE DRAIN 2000ML AR STRL (UROLOGICAL SUPPLIES) IMPLANT
BAG URO CATCHER STRL LF (MISCELLANEOUS) ×2 IMPLANT
CATH URETL OPEN END 6FR 70 (CATHETERS) IMPLANT
CLOTH BEACON ORANGE TIMEOUT ST (SAFETY) ×2 IMPLANT
DRAPE FOOT SWITCH (DRAPES) ×2 IMPLANT
GLOVE SURG LX STRL 7.5 STRW (GLOVE) ×2 IMPLANT
GOWN STRL REUS W/ TWL XL LVL3 (GOWN DISPOSABLE) ×2 IMPLANT
GOWN STRL REUS W/TWL XL LVL3 (GOWN DISPOSABLE) ×2
GUIDEWIRE STR DUAL SENSOR (WIRE) ×2 IMPLANT
KIT TURNOVER KIT A (KITS) IMPLANT
LASER FIB FLEXIVA PULSE ID 365 (Laser) IMPLANT
LOOP CUT BIPOLAR 24F LRG (ELECTROSURGICAL) IMPLANT
MANIFOLD NEPTUNE II (INSTRUMENTS) ×2 IMPLANT
NS IRRIG 1000ML POUR BTL (IV SOLUTION) IMPLANT
PACK CYSTO (CUSTOM PROCEDURE TRAY) ×2 IMPLANT
TRACTIP FLEXIVA PULS ID 200XHI (Laser) IMPLANT
TRACTIP FLEXIVA PULSE ID 200 (Laser)
TUBING CONNECTING 10 (TUBING) ×2 IMPLANT
TUBING UROLOGY SET (TUBING) ×2 IMPLANT

## 2023-01-19 NOTE — Transfer of Care (Signed)
Immediate Anesthesia Transfer of Care Note  Patient: Ana Santos  Procedure(s) Performed: CYSTOSCOPY WITH BLADDER BIOPSY POST OP GEMCITABINE (Bladder) BILATERAL RETROGRADE PYELOGRAM (Bilateral: Bladder) CYSTOSCOPY WITH FULGERATION (Bladder)  Patient Location: PACU  Anesthesia Type:General  Level of Consciousness: drowsy and patient cooperative  Airway & Oxygen Therapy: Patient Spontanous Breathing and Patient connected to face mask oxygen  Post-op Assessment: Report given to RN and Post -op Vital signs reviewed and stable  Post vital signs: Reviewed and stable  Last Vitals:  Vitals Value Taken Time  BP 131/67 01/19/23 0824  Temp    Pulse 74 01/19/23 0826  Resp 16 01/19/23 0826  SpO2 100 % 01/19/23 0826  Vitals shown include unfiled device data.  Last Pain:  Vitals:   01/19/23 0609  TempSrc: Oral  PainSc:          Complications: No notable events documented.

## 2023-01-19 NOTE — Anesthesia Procedure Notes (Signed)
Procedure Name: LMA Insertion Date/Time: 01/19/2023 7:44 AM  Performed by: Epimenio Sarin, CRNAPre-anesthesia Checklist: Patient identified, Emergency Drugs available, Suction available, Patient being monitored and Timeout performed Patient Re-evaluated:Patient Re-evaluated prior to induction Oxygen Delivery Method: Circle system utilized Preoxygenation: Pre-oxygenation with 100% oxygen Induction Type: IV induction Ventilation: Mask ventilation without difficulty LMA: LMA with gastric port inserted LMA Size: 4.0 Number of attempts: 1 Dental Injury: Teeth and Oropharynx as per pre-operative assessment

## 2023-01-19 NOTE — Anesthesia Preprocedure Evaluation (Addendum)
Anesthesia Evaluation  Patient identified by MRN, date of birth, ID band Patient awake    Reviewed: Allergy & Precautions, NPO status , Patient's Chart, lab work & pertinent test results  Airway Mallampati: II  TM Distance: >3 FB Neck ROM: Full    Dental  (+) Upper Dentures, Lower Dentures   Pulmonary COPD, Current Smoker and Patient abstained from smoking.   Pulmonary exam normal        Cardiovascular hypertension, Pt. on medications Normal cardiovascular exam+ Valvular Problems/Murmurs MVP      Neuro/Psych  Headaches PSYCHIATRIC DISORDERS Anxiety Depression    TIA Neuromuscular disease CVA    GI/Hepatic negative GI ROS,,,(+)     substance abuse    Endo/Other  Hypothyroidism    Renal/GU negative Renal ROS     Musculoskeletal  (+) Arthritis ,  Fibromyalgia -, narcotic dependent  Abdominal  (+) + obese  Peds  Hematology  (+) Blood dyscrasia (Plavix)   Anesthesia Other Findings BLADDER CANCER  Reproductive/Obstetrics                             Anesthesia Physical Anesthesia Plan  ASA: 3  Anesthesia Plan: General   Post-op Pain Management:    Induction: Intravenous  PONV Risk Score and Plan: 3 and Ondansetron, Dexamethasone and Treatment may vary due to age or medical condition  Airway Management Planned: LMA  Additional Equipment:   Intra-op Plan:   Post-operative Plan: Extubation in OR  Informed Consent: I have reviewed the patients History and Physical, chart, labs and discussed the procedure including the risks, benefits and alternatives for the proposed anesthesia with the patient or authorized representative who has indicated his/her understanding and acceptance.     Dental advisory given  Plan Discussed with: CRNA  Anesthesia Plan Comments:        Anesthesia Quick Evaluation

## 2023-01-19 NOTE — H&P (Signed)
H&P  Chief Complaint: bladder neoplasm  History of Present Illness: 82 yo female with h/o LG Ta bladder ca and recent papillary lesion (small) with satellite lesion on office cystoscopy Jul 2024. Here for bladder biopsy, Bilateral RGP, and post-op instillation chemotherapy.   She is well with no gross hematuria or dysuria.   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 Corliss Skains   APPENDECTOMY     BACK SURGERY  02/28/2021   BACK SURGERY     08/2021   BREAST LUMPECTOMY Right 1986   benign   DIAGNOSTIC LAPAROSCOPY     Proable laparoscopic right colectomy 03-14-18 Dr. Johna Sheriff   EYE SURGERY Bilateral    ioc lens for cataracts   LAPAROSCOPIC RIGHT COLECTOMY Right 03/14/2018   Procedure: LAPAROSCOPIC ASSISTED  RIGHT COLECTOMY;  Surgeon: Glenna Fellows, MD;  Location: [facility];  Service: General;  Laterality: Right;   LAPAROSCOPY N/A 03/14/2018   Procedure: LAPAROSCOPY;  Surgeon: Glenna Fellows, MD;  Location: [facility];  Service: General;  Laterality: N/A;   LEFT HEART CATHETERIZATION WITH CORONARY ANGIOGRAM N/A 08/05/2014   Procedure: LEFT HEART CATHETERIZATION WITH CORONARY ANGIOGRAM;  Surgeon: Iran Ouch, MD;  Location: [facility];  Service: Cardiovascular;  Laterality: N/A;   LUMBAR LAMINECTOMY N/A  02/28/2021   Procedure: LEFT L1-L2 MICRODISCECTOMY AND FASCIECTOMY;  Surgeon: Eldred Manges, MD;  Location: [facility];  Service: Orthopedics;  Laterality: N/A;   TONSILLECTOMY     TOTAL ABDOMINAL HYSTERECTOMY  1970   complete   TRANSLABYRINTHINE PROCEDURE  2002   [facility] Dr. Erroll Luna tumor removal   TRANSURETHRAL RESECTION OF BLADDER TUMOR N/A 02/08/2018   Procedure: TRANSURETHRAL RESECTION OF BLADDER TUMOR (TURBT)/ POSTOPERATIVE INSTILLATION OF CHEMO THERAPY;  Surgeon: Jerilee Field, MD;  Location[facility];  Service: Urology;  Laterality: N/A;    Home Medications:  Medications Prior to Admission  Medication Sig Dispense Refill Last Dose   amLODipine (NORVASC) 5 MG tablet TAKE 1 TABLET DAILY 90 tablet 3 01/18/2023   Cholecalciferol (VITAMIN D-3) 125 MCG (5000 UT) TABS Take 5,000 Units by mouth daily.   Past Week   clopidogrel (PLAVIX) 75 MG tablet TAKE 1 TABLET DAILY 90 tablet 3 01/13/2023   HYDROcodone-acetaminophen (NORCO/VICODIN) 5-325 MG tablet Take 1 tablet by mouth every 8 (eight) hours as needed for moderate pain. 90 tablet 0 01/18/2023   hydroxypropyl methylcellulose / hypromellose (ISOPTO TEARS / GONIOVISC) 2.5 % ophthalmic solution Place 1 drop into both eyes at bedtime.   01/18/2023   levothyroxine (SYNTHROID) 25 MCG tablet Take 0.5 tablets (12.5 mcg total) by mouth daily. Take together  with 50 mcg tablet (total daily dose of 62.5 mcg) 90 tablet 3 01/19/2023 at 0300   levothyroxine (SYNTHROID) 50 MCG tablet Take 1 tablet (50 mcg total) by mouth daily. 90 tablet 3 01/19/2023 at 0300   pramipexole (MIRAPEX) 0.5 MG tablet Take one nightly one hour prior to going to bed as needed for restless leg syndrome. 90 tablet 4 01/18/2023   pravastatin (PRAVACHOL) 40 MG tablet TAKE 1 TABLET DAILY 90 tablet 2 01/18/2023   sertraline (ZOLOFT) 50 MG tablet Take 1.5 tablets (75 mg total) by mouth daily. 135 tablet 3 01/18/2023   diclofenac Sodium (VOLTAREN ARTHRITIS PAIN) 1 % GEL Apply 4 g topically 4  (four) times daily. (Patient not taking: Reported on 12/29/2022) 150 g 3 Not Taking   gabapentin (NEURONTIN) 100 MG capsule Take 1 capsule (100 mg total) by mouth 3 (three) times daily. (Patient not taking: Reported on 12/29/2022) 90 capsule 2 Not Taking   SYNTHROID 75 MCG tablet Take 75 mcg by mouth daily. (Patient not taking: Reported on 12/29/2022)   Not Taking   Allergies:  Allergies  Allergen Reactions   Prednisone Other (See Comments)    Hallucinations   Atorvastatin Other (See Comments)     Lipitor caused arm pain (3/09)   Hydrocodone-Acetaminophen Other (See Comments)    hallucinations   Morphine Other (See Comments)    hallucinations   Nortriptyline Nausea And Vomiting    Caused nausea and vomiting and shaking, kept her up all night   Topamax [Topiramate] Nausea And Vomiting    *dizziness*   Tramadol Other (See Comments)    withdrawal    Family History  Problem Relation Age of Onset   Heart disease Mother    Cancer Mother        Lung   Heart attack Mother    Diabetes Mother    Cancer Father        Lung   Diabetes Sister    Cancer Sister        unkown type   Diabetes Brother    Lung cancer Brother        Lung   AAA (abdominal aortic aneurysm) Neg Hx    Colon cancer Neg Hx    Stomach cancer Neg Hx    Esophageal cancer Neg Hx    Liver cancer Neg Hx    Pancreatic cancer Neg Hx    Rectal cancer Neg Hx    Social History:  reports that she has been smoking cigarettes. She has a 12.5 pack-year smoking history. She has never used smokeless tobacco. She reports that she does not drink alcohol and does not use drugs.  ROS: A complete review of systems was performed.  All systems are negative except for pertinent findings as noted. Review of Systems  Musculoskeletal:  Positive for back pain.  Back surgery in 2022 and 2023 - still some pain   Physical Exam:  Vital signs in last 24 hours: Temp:  [98.9 F (37.2 C)] 98.9 F (37.2 C) (08/09 0609) Pulse Rate:  [62] 62  (08/09 0609) Resp:  [16] 16 (08/09 0609) BP: (143)/(72) 143/72 (08/09 0609) SpO2:  [97 %] 97 % (08/09 0609) Weight:  [74.4 kg] 74.4 kg (08/09 0551) General:  Alert and oriented, No acute distress HEENT: Normocephalic, atraumatic Cardiovascular: Regular rate and rhythm Lungs: Regular rate and effort Abdomen: Soft, nontender, nondistended, no abdominal masses Back: No CVA tenderness Extremities: No edema Neurologic: Grossly intact  Laboratory Data:  No results found for this or  any previous visit (from the past 24 hour(s)). No results found for this or any previous visit (from the past 240 hour(s)). Creatinine: No results for input(s): "CREATININE" in the last 168 hours.  Impression/Assessment:  Bladder neoplasm - h/o LG Ta -   Plan:  I discussed with the patient the nature, potential benefits, risks and alternatives to  bladder biopsy, Bilateral RGP, and post-op instillation chemotherapy, including side effects of the proposed treatment, the likelihood of the patient achieving the goals of the procedure, and any potential problems that might occur during the procedure or recuperation. All questions answered. Discussed she may need a foley. Patient elects to proceed.    Jerilee Field 01/19/2023, 7:19 AM

## 2023-01-19 NOTE — Anesthesia Postprocedure Evaluation (Signed)
Anesthesia Post Note  Patient: Ana Santos  Procedure(s) Performed: CYSTOSCOPY WITH BLADDER BIOPSY POST OP GEMCITABINE (Bladder) BILATERAL RETROGRADE PYELOGRAM (Bilateral: Bladder) CYSTOSCOPY WITH FULGERATION (Bladder)     Patient location during evaluation: PACU Anesthesia Type: General Level of consciousness: awake Pain management: pain level controlled Vital Signs Assessment: post-procedure vital signs reviewed and stable Respiratory status: spontaneous breathing, nonlabored ventilation and respiratory function stable Cardiovascular status: blood pressure returned to baseline and stable Postop Assessment: no apparent nausea or vomiting Anesthetic complications: no   No notable events documented.  Last Vitals:  Vitals:   01/19/23 0945 01/19/23 1000  BP: (!) 153/65 (!) 151/49  Pulse: 69 62  Resp: 20 18  Temp: 36.7 C   SpO2: 94% 93%    Last Pain:  Vitals:   01/19/23 1000  TempSrc:   PainSc: 0-No pain                  P 

## 2023-01-19 NOTE — Op Note (Signed)
Preoperative diagnosis: Bladder neoplasm Postoperative diagnosis: Bladder neoplasm, pelvic phleboliths  Procedure: Cystoscopy with bladder biopsy fulguration 0.5 to 2 cm, bilateral retrograde pyelogram, left diagnostic ureteroscopy  Surgeon: Mena Goes  Anesthesia: General  Indication for procedure Ana Santos is an 82 year old female with a history of low-grade TA bladder tumor.  On recent office cystoscopy there were 2 small papillary recurrences.  Findings: On exam under anesthesia the vulva appeared normal with moderate atrophy but no lesions.  Meatus appeared normal.  On bimanual cervix absent bladder and urethra are palpably normal.  On cystoscopy there were 2 small bladder papillary tumors 1 lateral to the right UO and 1 in the lateral and posterior to the right UO.  There were some fine glomerulations scattered throughout the bladder.  No stone or foreign body.  Ureteral orifice ease were normal with clear E flux.  Left retrograde pyelogram-on scout imaging there was a single calcification in the pelvis.  After injection of contrast this outlined a single ureter single collecting system unit without filling defect, stricture or dilation.  However the contrast went right over the pelvic opacity both on injection and brisk drainage.  Left diagnostic ureteroscopy-this outlined a normal distal and mid ureter without filling defect or stone.  Right retrograde pyelogram-this outlined a single ureter single collecting system unit without filling defect, stricture or hydration.  There were 2 or 3 pelvic phleboliths or opacities on the right side but these were clearly outside of the ureter on injection and brisk drainage.  Description of procedure: After consent was obtained patient brought to the operating room.  After adequate anesthesia she is placed in lithotomy position and prepped and draped in the usual sterile fashion.  Timeout was performed to confirm the patient and procedure.  Exam under  anesthesia was performed.  Cystoscope was passed per urethra to the bladder carefully inspected.  With a 30 degree lens I was able to see all of the bladder neck and the dome.  The left ureteral orifice was cannulated with a 5 [place] open-ended catheter a left retrograde injection of contrast was performed.  The right ureteral orifice was cannulated with a 5 [place] open-ended catheter and right retrograde injection of contrast was performed.  I then took a single channel needlepoint semirigid scope and passed that into the bladder and then into the left ureter to double check to ensure there was no stone in the ureter.  The left ureter from was clear well up above the opacity toward the mid ureter.  The scope was backed out.  Ureter without erythema, edema or any sign of trauma.  No stent was placed.  I took the cold cup biopsy forceps and biopsied the 2 papillary lesions and sent those to his right bladder biopsy.  The biopsy sites were then fulgurated. They were about 10 mm each.   A 16 French Foley catheter was placed.  She was awakened taken to the cover room in stable condition.  Instillation of gemcitabine in PACU: Gemcitabine 2000 mg was instilled per urethra and allowed to dwell in the bladder for about 50 minutes and then drained.  Complications: None  Blood loss: Minimal  Specimens to pathology: Right bladder biopsy  Drains: 16 French Foley catheter  Disposition: Patient stable to PACU.

## 2023-01-20 ENCOUNTER — Encounter (HOSPITAL_COMMUNITY): Payer: Self-pay | Admitting: Urology

## 2023-01-25 ENCOUNTER — Encounter: Payer: Self-pay | Admitting: Physical Medicine & Rehabilitation

## 2023-01-25 ENCOUNTER — Encounter: Payer: Medicare Other | Attending: Physical Medicine & Rehabilitation | Admitting: Physical Medicine & Rehabilitation

## 2023-01-25 VITALS — BP 156/74 | HR 59 | Ht 62.0 in | Wt 128.0 lb

## 2023-01-25 DIAGNOSIS — M961 Postlaminectomy syndrome, not elsewhere classified: Secondary | ICD-10-CM | POA: Insufficient documentation

## 2023-01-25 DIAGNOSIS — Z79891 Long term (current) use of opiate analgesic: Secondary | ICD-10-CM | POA: Insufficient documentation

## 2023-01-25 DIAGNOSIS — G894 Chronic pain syndrome: Secondary | ICD-10-CM | POA: Diagnosis not present

## 2023-01-25 MED ORDER — HYDROCODONE-ACETAMINOPHEN 5-325 MG PO TABS: 1.0000 | ORAL_TABLET | Freq: Three times a day (TID) | ORAL | 0 refills | Status: DC | PRN

## 2023-01-25 MED ORDER — GABAPENTIN 100 MG PO CAPS: 100.0000 mg | ORAL_CAPSULE | Freq: Three times a day (TID) | ORAL | 2 refills | Status: DC

## 2023-01-25 NOTE — Progress Notes (Signed)
Subjective:    Patient ID: Ana Santos, female    DOB: [birth year], 82 y.o.   MRN: [id number]  HPI    HPI Ana Santos is a 82 y.o. year old female  who  has a past medical history of AN (acoustic neuroma) (HCC), Aneurysm (HCC), Anxiety, Bladder cancer (HCC), Cataract, Cigarette smoker, COPD (chronic obstructive pulmonary disease) (HCC), Coronary artery disease, Depression, Headache, Hyperlipidemia, Hypertension, Hypothyroidism, IBS (irritable bowel syndrome), Mitral valve prolapse, Osteopenia, Stroke (HCC), Venous insufficiency, and Vitamin D deficiency.   They are presenting to PM&R clinic as a new patient for pain management evaluation. They were referred for treatment of chronic lumbar and thoracic back pain.   She reports that she has had worsening pain for about 4 years.  No consistent pain shooting down her legs.  Pain is stabbing in quality.  Lifting anything makes it worse.  Denies any numbness or paresthesias in her limbs.  She had a prior left L1-2 hemilaminectomy and then later a L1-L2 TLIF in March 2023.  She is previously followed by Dr. Otelia Sergeant.  She is now being seen by Dr. Willia Craze orthopedic spine surgery.  She reports a history of bowel surgery with small intestine removed previously.   Red flag symptoms: Patient denies saddle anesthesia, loss of bowel or bladder continence, new weakness, new numbness/tingling, or pain waking up at nighttime.   Medications tried: Tylenol, advil, lidcaine patch, icy hot with mild benefit She is currently on Plavix which limits her medication options Hydrocodone 10 mg previously helped control her pain  tramadol-this did help her pain however she had very severe withdrawal issues when she did not get her medication on time, would like to avoid this medication if possible Oxycodone helped her pain previously   Other treatments: PT/OT  made it worse Heat pad helps 10-15 ESI injections- reports minimal benefit  L1-2 hemilaminectomy  and then later a L1-L2 TLIF in March 2023.  Without sustained improvement   Interval history 06/27/2022 Ana Santos is here for follow-up regarding her chronic pain.  She continues to have lumbar and thoracic back pain.  She says the pain has been very severe since medications have been discontinued.  She did get some tramadol at the end of December which provided mild benefit to her pain.  She denies any use of other substances or marijuana since her last visit.  She says she regrets having her back surgery done last year.   Interval history 08/29/2022 Ana Santos is here for follow-up regarding her chronic back pain primarily in her lumbar and thoracic back.  She also recently had a left SI joint injection by Dr. Wynn Banker.  This provided short-term pain relief however the pain returned after a day or two.  Hydrocodone 5 mg has been helping her pain and makes it more tolerable.  She is not having any side effects of this medication.  She does report that hydrocodone does not last long enough and this leaves her in significant pain throughout the day.     Interval history 10/10/22 Ana Santos is here for follow-up regarding her chronic pain.  Pill counts appear to be consistent with her hydrocodone.  She is trying only uses medication when the pain is very severe.  No side effects with the hydrocodone reported.  The hydrocodone is helping her lower back pain remains tolerable.  She is also had some discomfort around her right inguinal area/anterior hip.  She says this started after doing some work in  the yard.  She has occasional use heating pads for her lower back and found this to be helpful.  She has not tried TENS before.     Interval History 12/07/22 Patient is here with her husband for follow-up regarding her chronic pain.  She continues to have severe lower back and thoracic back pain.  Back pain will radiate into her thighs.  She denies any recent changes in strength or sensation.   Hydrocodone 5 mg continues to help keep her pain controlled, she usually takes 1 in the morning, and will take another tablet later in the day if needed.  She has not had any recent falls.  No side effects with her medications.  Patient reports she is able to be more active when using the hydrocodone and is able to get done more around her house.       Interval History 01/25/23 Patient is here with her husband following up regarding treatment for her chronic pain.  She continues to have lower back pain with some pain in her mid back and thighs right greater than left.  She also has pain shooting down her right leg.  Patient reports that hydrocodone is helping keep her pain more tolerable.  She is not having any side effects with hydrocodone.  She tried physical therapy but says it made her pain worse.  She has not tried gabapentin, she says she did not know this was available.    Pain Inventory Average Pain 10 Pain Right Now 9 My pain is constant, sharp, stabbing, and aching  In the last 24 hours, has pain interfered with the following? General activity 7 Relation with others 7 Enjoyment of life 7 What TIME of day is your pain at its worst? morning  Sleep (in general) Poor  Pain is worse with: walking, bending, standing, and some activites Pain improves with: rest, heat/ice, and medication Relief from Meds: 8  Family History  Problem Relation Age of Onset   Heart disease Mother    Cancer Mother        Lung   Heart attack Mother    Diabetes Mother    Cancer Father        Lung   Diabetes Sister    Cancer Sister        unkown type   Diabetes Brother    Lung cancer Brother        Lung   AAA (abdominal aortic aneurysm) Neg Hx    Colon cancer Neg Hx    Stomach cancer Neg Hx    Esophageal cancer Neg Hx    Liver cancer Neg Hx    Pancreatic cancer Neg Hx    Rectal cancer Neg Hx    Social History   Socioeconomic History   Marital status: Married    Spouse name: Windy Fast    Number of children: 5   Years of education: Not on file   Highest education level: Not on file  Occupational History   Occupation: alterations   Occupation: Retired    Comment: Neurosurgeon  Tobacco Use   Smoking status: Every Day    Current packs/day: 0.25    Average packs/day: 0.3 packs/day for 50.0 years (12.5 ttl pk-yrs)    Types: Cigarettes   Smokeless tobacco: Never   Tobacco comments:    4-5 cigs daily  is aware she needs to quit  Vaping Use   Vaping status: Never Used  Substance and Sexual Activity   Alcohol use: Never   Drug  use: Never   Sexual activity: Not Currently  Other Topics Concern   Not on file  Social History Narrative   Pt is married, 5 sons (4 living), 14 grandchildren, 4 great grandchildren.   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   Social Determinants of Corporate investment banker Strain: Low Risk  (07/31/2022)   Overall Financial Resource Strain (CARDIA)    Difficulty of Paying Living Expenses: Not hard at all  Food Insecurity: No Food Insecurity (07/31/2022)   Hunger Vital Sign    Worried About Running Out of Food in the Last Year: Never true    Ran Out of Food in the Last Year: Never true  Transportation Needs: No Transportation Needs (07/31/2022)   PRAPARE - Administrator, Civil Service (Medical): No    Lack of Transportation (Non-Medical): No  Physical Activity: Inactive (07/31/2022)   Exercise Vital Sign    Days of Exercise per Week: 0 days    Minutes of Exercise per Session: 0 min  Stress: No Stress Concern Present (07/31/2022)   [employer] of Occupational Health - Occupational Stress Questionnaire    Feeling of Stress : Not at all  Social Connections: Moderately Isolated (01/29/2020)   Social Connection and Isolation Panel [NHANES]    Frequency of Communication with Friends and Family: More than three times a week    Frequency of Social Gatherings with Friends and Family: Once a week     Attends Religious Services: Never    Database administrator or Organizations: No    Attends Banker Meetings: Never    Marital Status: Married   Past Surgical History:  Procedure Laterality Date   ANEURYSM COILING  04/2009   Dr. Corliss Skains   APPENDECTOMY     BACK SURGERY  02/28/2021   BACK SURGERY     08/2021   BREAST LUMPECTOMY Right 1986   benign   CYSTOSCOPY W/ RETROGRADES Bilateral 01/19/2023   Procedure: BILATERAL RETROGRADE PYELOGRAM;  Surgeon: Jerilee Field, MD;  Location: [facility];  Service: Urology;  Laterality: Bilateral;   CYSTOSCOPY WITH BIOPSY N/A 01/19/2023   Procedure: CYSTOSCOPY WITH BLADDER BIOPSY POST OP GEMCITABINE;  Surgeon: Jerilee Field, MD;  Location: [facility];  Service: Urology;  Laterality: N/A;   CYSTOSCOPY WITH FULGERATION N/A 01/19/2023   Procedure: CYSTOSCOPY WITH FULGERATION;  Surgeon: Jerilee Field, MD;  Location: [facility];  Service: Urology;  Laterality: N/A;  75 MINS FOR CASE   DIAGNOSTIC LAPAROSCOPY     Proable laparoscopic right colectomy 03-14-18 Dr. Johna Sheriff   EYE SURGERY Bilateral    ioc lens for cataracts   LAPAROSCOPIC RIGHT COLECTOMY Right 03/14/2018   Procedure: LAPAROSCOPIC ASSISTED  RIGHT COLECTOMY;  Surgeon: Glenna Fellows, MD;  Location: [facility];  Service: General;  Laterality: Right;   LAPAROSCOPY N/A 03/14/2018   Procedure: LAPAROSCOPY;  Surgeon: Glenna Fellows, MD;  Location: [facility];  Service: General;  Laterality: N/A;   LEFT HEART CATHETERIZATION WITH CORONARY ANGIOGRAM N/A 08/05/2014   Procedure: LEFT HEART CATHETERIZATION WITH CORONARY ANGIOGRAM;  Surgeon: Iran Ouch, MD;  Location: [facility];  Service: Cardiovascular;  Laterality: N/A;   LUMBAR LAMINECTOMY N/A 02/28/2021   Procedure: LEFT L1-L2 MICRODISCECTOMY AND FASCIECTOMY;  Surgeon: Eldred Manges, MD;  Location: [facility];  Service: Orthopedics;  Laterality: N/A;   TONSILLECTOMY     TOTAL ABDOMINAL HYSTERECTOMY  1970   complete   TRANSLABYRINTHINE  PROCEDURE  2002   [facility] Dr.  [place] tumor removal   TRANSURETHRAL RESECTION OF BLADDER TUMOR N/A 02/08/2018   Procedure: TRANSURETHRAL RESECTION OF BLADDER TUMOR (TURBT)/ POSTOPERATIVE INSTILLATION OF CHEMO THERAPY;  Surgeon: Jerilee Field, MD;  Location: [facility];  Service: Urology;  Laterality: N/A;   Past Surgical History:  Procedure Laterality Date   ANEURYSM COILING  04/2009   Dr. Corliss Skains   APPENDECTOMY     BACK SURGERY  02/28/2021   BACK SURGERY     08/2021   BREAST LUMPECTOMY Right 1986   benign   CYSTOSCOPY W/ RETROGRADES Bilateral 01/19/2023   Procedure: BILATERAL RETROGRADE PYELOGRAM;  Surgeon: Jerilee Field, MD;  Location: [facility];  Service: Urology;  Laterality: Bilateral;   CYSTOSCOPY WITH BIOPSY N/A 01/19/2023   Procedure: CYSTOSCOPY WITH BLADDER BIOPSY POST OP GEMCITABINE;  Surgeon: Jerilee Field, MD;  Location: [facility];  Service: Urology;  Laterality: N/A;   CYSTOSCOPY WITH FULGERATION N/A 01/19/2023   Procedure: CYSTOSCOPY WITH FULGERATION;  Surgeon: Jerilee Field, MD;  Location: [facility];  Service: Urology;  Laterality: N/A;  75 MINS FOR CASE   DIAGNOSTIC LAPAROSCOPY     Proable laparoscopic right colectomy 03-14-18 Dr. Johna Sheriff   EYE SURGERY Bilateral    ioc lens for cataracts   LAPAROSCOPIC RIGHT COLECTOMY Right 03/14/2018   Procedure: LAPAROSCOPIC ASSISTED  RIGHT COLECTOMY;  Surgeon: Glenna Fellows, MD;  Location: [facility];  Service: General;  Laterality: Right;   LAPAROSCOPY N/A 03/14/2018   Procedure: LAPAROSCOPY;  Surgeon: Glenna Fellows, MD;  Location: [facility];  Service: General;  Laterality: N/A;   LEFT HEART CATHETERIZATION WITH CORONARY ANGIOGRAM N/A 08/05/2014   Procedure: LEFT HEART CATHETERIZATION WITH CORONARY ANGIOGRAM;  Surgeon: Iran Ouch, MD;  Location: [facility];  Service: Cardiovascular;  Laterality: N/A;   LUMBAR LAMINECTOMY N/A 02/28/2021   Procedure: LEFT L1-L2 MICRODISCECTOMY AND FASCIECTOMY;  Surgeon: Eldred Manges, MD;  Location: [facility];  Service: Orthopedics;  Laterality: N/A;   TONSILLECTOMY     TOTAL ABDOMINAL HYSTERECTOMY  1970   complete   TRANSLABYRINTHINE PROCEDURE  2002   [facility] Dr. 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  Opioid Risk Score:  Fall Risk Score:  `1  Depression screen [facility] 2/9     12/08/2022    7:55 AM 12/07/2022   11:05 AM 10/10/2022    9:19 AM 09/07/2022    8:18 AM 08/29/2022  9:58 AM 07/31/2022    2:07 PM 07/21/2022   12:10 PM  Depression screen PHQ 2/9  Decreased Interest 3 0 1 3 0 0 1  Down, Depressed, Hopeless 3 0 1 2 0 0 1  PHQ - 2 Score 6 0 2 5 0 0 2  Altered sleeping 3   1     Tired, decreased energy 3   3     Change in appetite 0   3     Feeling bad or failure about yourself  3   3     Trouble concentrating 0   3     Moving slowly or fidgety/restless 0   0     Suicidal thoughts 0   0     PHQ-9 Score 15   18     Difficult doing work/chores Very difficult   Extremely dIfficult       Review of Systems  Musculoskeletal:   Positive for back pain.       Pain in both upper legs & left groin/hip area  All other systems reviewed and are negative.      Objective:   Physical Exam    Physical Exam Gen: no distress, normal appearing HEENT: oral mucosa pink and moist, NCAT Cardio: Reg rate Chest: normal effort, normal rate of breathing Abd: soft, non-distended Ext: no edema Psych: pleasant, appropriate  Skin: intact Neuro: Alert and awake, follows commands, cranial nerves II through XII grossly intact, speech and language normal Sensation to light touch in all 4 extremities Strength  5 out of 5 right lower extremity and hip flexion, knee extension, ankle PF and DF Strength 4+ out of 5 in left lower extremity hip flexion, knee extension, ankle PF and DF-she reports this is chronic since her prior surgery, unchanged Musculoskeletal: Slump negative  Tenderness to palpation left SI joint -unchanged Mild T-spine paraspinal tenderness Facet loading positive today     MRI T spine 05/01/22 FINDINGS: Alignment:  Physiologic.   Vertebrae: No acute fracture, evidence of discitis, or aggressive bone lesion. T7 vertebral body hemangioma noted.   Cord:  Normal signal and morphology.   Paraspinal and other soft tissues: No acute paraspinal abnormality.   Disc levels:   Disc spaces: Degenerative disease with disc height loss at T10-11 and T11-12. Degenerative disease with disc height loss at T1-2. Partially visualized on the scout images is degenerative disease with disc height loss at C3-4, C4-5 and C5-6.   T1-T2: Mild broad-based disc bulge. Severe right and moderate left foraminal stenosis. No spinal stenosis.   T2-T3: No disc protrusion, foraminal stenosis or central canal stenosis.   T3-T4: No disc protrusion, foraminal stenosis or central canal stenosis.   T4-T5: No disc protrusion, foraminal stenosis or central canal stenosis.   T5-T6: No disc protrusion, foraminal stenosis or central  canal stenosis.   T6-T7: No disc protrusion, foraminal stenosis or central canal stenosis.   T7-T8: No disc protrusion, foraminal stenosis or central canal stenosis.   T8-T9: No disc protrusion, foraminal stenosis or central canal stenosis.   T9-T10: No disc protrusion, foraminal stenosis or central canal stenosis.   T10-T11: Mild broad-based disc bulge. No foraminal or central canal stenosis. Right perineural cyst noted.   T11-T12: No disc protrusion, foraminal stenosis or central canal stenosis.   IMPRESSION: 1. At T1-2 there is a mild broad-based disc bulge. Severe right and moderate left foraminal stenosis. 2. No acute osseous injury of the thoracic spine.   L spine MRI 12/10/21 FINDINGS: Segmentation:  Standard. Lowest well-formed disc space labeled the L5-S1 level.   Alignment: Up to 4 mm anterolisthesis of L5 on S1, stable. Trace anterolisthesis of L3 on L4, also relatively stable. Straightening of the normal lumbar lordosis elsewhere. Underlying sigmoid scoliosis.   Vertebrae: Susceptibility artifact from interval PLIF at L1-2. Vertebral body height maintained without acute or chronic fracture. Bone marrow signal intensity within normal limits. Few small benign hemangiomata noted. No worrisome osseous lesions. Mild reactive marrow edema noted about the partially visualized T10-11 interspace. Reactive edema present about the right L5-S1 facet due to facet arthritis.   Conus medullaris and cauda equina: Conus extends to the L1-2 level. Conus and cauda equina appear normal.   Paraspinal and other soft tissues: Postoperative changes within the posterior paraspinous soft tissues without adverse features. Few scattered cysts noted about the partially visualized kidneys, similar to prior, likely benign. No follow-up imaging recommended.   Disc levels:   T11-12: Seen only on sagittal projection. Mild disc bulge with reactive endplate spurring. Left worse than right  facet hypertrophy. No significant spinal stenosis. Foramina appear patent.   T12-L1: Chronic endplate Schmorl's node deformities without significant disc bulge. Mild right-sided facet hypertrophy. No significant stenosis.   L1-2: Interval PLIF. No residual or recurrent spinal stenosis. Foramina appear patent.   L2-3: Disc bulge with disc desiccation and intervertebral disc space narrowing. Disc bulging eccentric to the left. Reactive endplate spurring. Moderate left with mild right facet arthrosis. Resultant mild narrowing of the left lateral recess. Mild left L2 foraminal narrowing. Right neural foramen remains patent. Appearance is stable.   L3-4: Degenerative intervertebral disc space narrowing with diffuse disc bulge and disc desiccation. Disc bulging eccentric to the left with left-sided reactive endplate spurring. Resultant extraforaminal disc osteophyte contacts the exiting left L3 nerve root as it courses of the left neural foramen (series 101, image 20). Moderate left with mild right facet arthrosis. Resultant mild narrowing of the left lateral recess. Mild left L3 foraminal stenosis. Right neural foramen remains patent. Appearance is stable.   L4-5: Disc bulge with disc desiccation. Reactive endplate spurring. Mild to moderate facet hypertrophy. No more than mild narrowing of the lateral recesses bilaterally. Mild to moderate right with mild left L4 foraminal stenosis. Appearance is stable.   L5-S1: Anterolisthesis. Disc desiccation with broad posterior disc bulge. Severe right-sided facet arthrosis with reactive marrow edema. Moderate left-sided foraminal narrowing. No significant spinal stenosis. Mild right L5 foraminal stenosis. Appearance is similar.   IMPRESSION: 1. Interval PLIF at L1-2 without residual or recurrent stenosis. 2. Multilevel degenerative spondylosis elsewhere throughout the lumbar spine with resultant mild left lateral recess and  foraminal stenosis at L2-3 and L3-4, and mild to moderate bilateral foraminal narrowing at L4-5 and L5-S1 as above. 3. Advanced right-sided facet arthrosis at L5-S1 with associated reactive marrow edema, which could serve as a source for lower back pain.  T-spine         Assessment & Plan:   Chronic lumbar and thoracic back pain and failed back syndrome, I also suspect she has some SI joint dysfunction on the left -Hx of  L1-L2 TLIF -UDS and pain agreement completed prior visit -Recheck UDS -Continue hydrocodone at 5 mg dose Q4h PRN, no more than 3 doses a day #90 with each refill- call when low  -Pt seen by Dr. Wynn Banker for left SI joint injection- helped for few days -Will place consult to Dr. Wynn Banker for medial branch blocks bilateral L3-L4, L4-L5, L5-S1 -Pt has allergy noted to hydrocodone  in chart-she insists this is not correct and she does not have this allergy -Zynex TENS and Heat/Cold therapy device ordered prior visit -Will reorder gabapentin 100 mg - 200 mg 3 times daily.  Start with 100 mg 3 times daily and can increase to 200 mg 3 times daily if tolerating after a week -PT -patient reports this did not provide benefit   R anterior hip pain -Continue Voltaren gel -Consider imaging if doesn't improve   THC use history -Patient reports she is not currently using -Continue random UDS   HTN -Advised follow-up with PCP

## 2023-01-25 NOTE — Progress Notes (Signed)
Subjective:    Patient ID: Ana Santos, female    DOB: [birth year], 82 y.o.   MRN: [id number]  HPI    HPI Ana Santos is a 82 y.o. year old female  who  has a past medical history of AN (acoustic neuroma) (HCC), Aneurysm (HCC), Anxiety, Bladder cancer (HCC), Cataract, Cigarette smoker, COPD (chronic obstructive pulmonary disease) (HCC), Coronary artery disease, Depression, Headache, Hyperlipidemia, Hypertension, Hypothyroidism, IBS (irritable bowel syndrome), Mitral valve prolapse, Osteopenia, Stroke (HCC), Venous insufficiency, and Vitamin D deficiency.   They are presenting to PM&R clinic as a new patient for pain management evaluation. They were referred for treatment of chronic lumbar and thoracic back pain.   She reports that she has had worsening pain for about 4 years.  No consistent pain shooting down her legs.  Pain is stabbing in quality.  Lifting anything makes it worse.  Denies any numbness or paresthesias in her limbs.  She had a prior left L1-2 hemilaminectomy and then later a L1-L2 TLIF in March 2023.  She is previously followed by Dr. Otelia Sergeant.  She is now being seen by Dr. Willia Craze orthopedic spine surgery.  She reports a history of bowel surgery with small intestine removed previously.   Red flag symptoms: Patient denies saddle anesthesia, loss of bowel or bladder continence, new weakness, new numbness/tingling, or pain waking up at nighttime.   Medications tried: Tylenol, advil, lidcaine patch, icy hot with mild benefit She is currently on Plavix which limits her medication options Hydrocodone 10 mg previously helped control her pain  tramadol-this did help her pain however she had very severe withdrawal issues when she did not get her medication on time, would like to avoid this medication if possible Oxycodone helped her pain previously   Other treatments: PT/OT  made it worse Heat pad helps 10-15 ESI injections- reports minimal benefit  L1-2 hemilaminectomy  and then later a L1-L2 TLIF in March 2023.  Without sustained improvement   Interval history 06/27/2022 Ana Santos is here for follow-up regarding her chronic pain.  She continues to have lumbar and thoracic back pain.  She says the pain has been very severe since medications have been discontinued.  She did get some tramadol at the end of December which provided mild benefit to her pain.  She denies any use of other substances or marijuana since her last visit.  She says she regrets having her back surgery done last year.   Interval history 08/29/2022 Ana Santos is here for follow-up regarding her chronic back pain primarily in her lumbar and thoracic back.  She also recently had a left SI joint injection by Dr. Wynn Banker.  This provided short-term pain relief however the pain returned after a day or two.  Hydrocodone 5 mg has been helping her pain and makes it more tolerable.  She is not having any side effects of this medication.  She does report that hydrocodone does not last long enough and this leaves her in significant pain throughout the day.     Interval history 10/10/22 Ana Santos is here for follow-up regarding her chronic pain.  Pill counts appear to be consistent with her hydrocodone.  She is trying only uses medication when the pain is very severe.  No side effects with the hydrocodone reported.  The hydrocodone is helping her lower back pain remains tolerable.  She is also had some discomfort around her right inguinal area/anterior hip.  She says this started after doing some work in  the yard.  She has occasional use heating pads for her lower back and found this to be helpful.  She has not tried TENS before.     Interval History 12/07/22 Patient is here with her husband for follow-up regarding her chronic pain.  She continues to have severe lower back and thoracic back pain.  Back pain will radiate into her thighs.  She denies any recent changes in strength or sensation.   Hydrocodone 5 mg continues to help keep her pain controlled, she usually takes 1 in the morning, and will take another tablet later in the day if needed.  She has not had any recent falls.  No side effects with her medications.  Patient reports she is able to be more active when using the hydrocodone and is able to get done more around her house.       Interval History 01/25/23 Patient is here with her husband following up regarding treatment for her chronic pain.  She continues to have lower back pain with some pain in her mid back and thighs right greater than left.  She also has pain shooting down her right leg.  Patient reports that hydrocodone is helping keep her pain more tolerable.  She is not having any side effects with hydrocodone.  She tried physical therapy but says it made her pain worse.  She has not tried gabapentin, she says she did not know this was available.    Pain Inventory Average Pain 10 Pain Right Now 9 My pain is constant, sharp, stabbing, and aching  In the last 24 hours, has pain interfered with the following? General activity 7 Relation with others 7 Enjoyment of life 7 What TIME of day is your pain at its worst? morning  Sleep (in general) Poor  Pain is worse with: walking, bending, standing, and some activites Pain improves with: rest, heat/ice, and medication Relief from Meds: 8  Family History  Problem Relation Age of Onset   Heart disease Mother    Cancer Mother        Lung   Heart attack Mother    Diabetes Mother    Cancer Father        Lung   Diabetes Sister    Cancer Sister        unkown type   Diabetes Brother    Lung cancer Brother        Lung   AAA (abdominal aortic aneurysm) Neg Hx    Colon cancer Neg Hx    Stomach cancer Neg Hx    Esophageal cancer Neg Hx    Liver cancer Neg Hx    Pancreatic cancer Neg Hx    Rectal cancer Neg Hx    Social History   Socioeconomic History   Marital status: Married    Spouse name: Windy Fast    Number of children: 5   Years of education: Not on file   Highest education level: Not on file  Occupational History   Occupation: alterations   Occupation: Retired    Comment: Neurosurgeon  Tobacco Use   Smoking status: Every Day    Current packs/day: 0.25    Average packs/day: 0.3 packs/day for 50.0 years (12.5 ttl pk-yrs)    Types: Cigarettes   Smokeless tobacco: Never   Tobacco comments:    4-5 cigs daily  is aware she needs to quit  Vaping Use   Vaping status: Never Used  Substance and Sexual Activity   Alcohol use: Never   Drug  use: Never   Sexual activity: Not Currently  Other Topics Concern   Not on file  Social History Narrative   Pt is married, 5 sons (4 living), 14 grandchildren, 4 great grandchildren.   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   Social Determinants of Corporate investment banker Strain: Low Risk  (07/31/2022)   Overall Financial Resource Strain (CARDIA)    Difficulty of Paying Living Expenses: Not hard at all  Food Insecurity: No Food Insecurity (07/31/2022)   Hunger Vital Sign    Worried About Running Out of Food in the Last Year: Never true    Ran Out of Food in the Last Year: Never true  Transportation Needs: No Transportation Needs (07/31/2022)   PRAPARE - Administrator, Civil Service (Medical): No    Lack of Transportation (Non-Medical): No  Physical Activity: Inactive (07/31/2022)   Exercise Vital Sign    Days of Exercise per Week: 0 days    Minutes of Exercise per Session: 0 min  Stress: No Stress Concern Present (07/31/2022)   [employer] of Occupational Health - Occupational Stress Questionnaire    Feeling of Stress : Not at all  Social Connections: Moderately Isolated (01/29/2020)   Social Connection and Isolation Panel [NHANES]    Frequency of Communication with Friends and Family: More than three times a week    Frequency of Social Gatherings with Friends and Family: Once a week     Attends Religious Services: Never    Database administrator or Organizations: No    Attends Banker Meetings: Never    Marital Status: Married   Past Surgical History:  Procedure Laterality Date   ANEURYSM COILING  04/2009   Dr. Corliss Skains   APPENDECTOMY     BACK SURGERY  02/28/2021   BACK SURGERY     08/2021   BREAST LUMPECTOMY Right 1986   benign   CYSTOSCOPY W/ RETROGRADES Bilateral 01/19/2023   Procedure: BILATERAL RETROGRADE PYELOGRAM;  Surgeon: Jerilee Field, MD;  Location: [facility];  Service: Urology;  Laterality: Bilateral;   CYSTOSCOPY WITH BIOPSY N/A 01/19/2023   Procedure: CYSTOSCOPY WITH BLADDER BIOPSY POST OP GEMCITABINE;  Surgeon: Jerilee Field, MD;  Location: [facility];  Service: Urology;  Laterality: N/A;   CYSTOSCOPY WITH FULGERATION N/A 01/19/2023   Procedure: CYSTOSCOPY WITH FULGERATION;  Surgeon: Jerilee Field, MD;  Location: [facility];  Service: Urology;  Laterality: N/A;  75 MINS FOR CASE   DIAGNOSTIC LAPAROSCOPY     Proable laparoscopic right colectomy 03-14-18 Dr. Johna Sheriff   EYE SURGERY Bilateral    ioc lens for cataracts   LAPAROSCOPIC RIGHT COLECTOMY Right 03/14/2018   Procedure: LAPAROSCOPIC ASSISTED  RIGHT COLECTOMY;  Surgeon: Glenna Fellows, MD;  Location: [facility];  Service: General;  Laterality: Right;   LAPAROSCOPY N/A 03/14/2018   Procedure: LAPAROSCOPY;  Surgeon: Glenna Fellows, MD;  Location: [facility];  Service: General;  Laterality: N/A;   LEFT HEART CATHETERIZATION WITH CORONARY ANGIOGRAM N/A 08/05/2014   Procedure: LEFT HEART CATHETERIZATION WITH CORONARY ANGIOGRAM;  Surgeon: Iran Ouch, MD;  Location: [facility];  Service: Cardiovascular;  Laterality: N/A;   LUMBAR LAMINECTOMY N/A 02/28/2021   Procedure: LEFT L1-L2 MICRODISCECTOMY AND FASCIECTOMY;  Surgeon: Eldred Manges, MD;  Location: [facility];  Service: Orthopedics;  Laterality: N/A;   TONSILLECTOMY     TOTAL ABDOMINAL HYSTERECTOMY  1970   complete   TRANSLABYRINTHINE  PROCEDURE  2002   [facility] Dr.  [place] tumor removal   TRANSURETHRAL RESECTION OF BLADDER TUMOR N/A 02/08/2018   Procedure: TRANSURETHRAL RESECTION OF BLADDER TUMOR (TURBT)/ POSTOPERATIVE INSTILLATION OF CHEMO THERAPY;  Surgeon: Jerilee Field, MD;  Location: [facility];  Service: Urology;  Laterality: N/A;   Past Surgical History:  Procedure Laterality Date   ANEURYSM COILING  04/2009   Dr. Corliss Skains   APPENDECTOMY     BACK SURGERY  02/28/2021   BACK SURGERY     08/2021   BREAST LUMPECTOMY Right 1986   benign   CYSTOSCOPY W/ RETROGRADES Bilateral 01/19/2023   Procedure: BILATERAL RETROGRADE PYELOGRAM;  Surgeon: Jerilee Field, MD;  Location: [facility];  Service: Urology;  Laterality: Bilateral;   CYSTOSCOPY WITH BIOPSY N/A 01/19/2023   Procedure: CYSTOSCOPY WITH BLADDER BIOPSY POST OP GEMCITABINE;  Surgeon: Jerilee Field, MD;  Location: [facility];  Service: Urology;  Laterality: N/A;   CYSTOSCOPY WITH FULGERATION N/A 01/19/2023   Procedure: CYSTOSCOPY WITH FULGERATION;  Surgeon: Jerilee Field, MD;  Location: [facility];  Service: Urology;  Laterality: N/A;  75 MINS FOR CASE   DIAGNOSTIC LAPAROSCOPY     Proable laparoscopic right colectomy 03-14-18 Dr. Johna Sheriff   EYE SURGERY Bilateral    ioc lens for cataracts   LAPAROSCOPIC RIGHT COLECTOMY Right 03/14/2018   Procedure: LAPAROSCOPIC ASSISTED  RIGHT COLECTOMY;  Surgeon: Glenna Fellows, MD;  Location: [facility];  Service: General;  Laterality: Right;   LAPAROSCOPY N/A 03/14/2018   Procedure: LAPAROSCOPY;  Surgeon: Glenna Fellows, MD;  Location: [facility];  Service: General;  Laterality: N/A;   LEFT HEART CATHETERIZATION WITH CORONARY ANGIOGRAM N/A 08/05/2014   Procedure: LEFT HEART CATHETERIZATION WITH CORONARY ANGIOGRAM;  Surgeon: Iran Ouch, MD;  Location: [facility];  Service: Cardiovascular;  Laterality: N/A;   LUMBAR LAMINECTOMY N/A 02/28/2021   Procedure: LEFT L1-L2 MICRODISCECTOMY AND FASCIECTOMY;  Surgeon: Eldred Manges, MD;  Location: [facility];  Service: Orthopedics;  Laterality: N/A;   TONSILLECTOMY     TOTAL ABDOMINAL HYSTERECTOMY  1970   complete   TRANSLABYRINTHINE PROCEDURE  2002   [facility] Dr. Erroll Luna tumor removal   TRANSURETHRAL RESECTION OF BLADDER TUMOR N/A 02/08/2018   Procedure: TRANSURETHRAL RESECTION OF BLADDER TUMOR (TURBT)/ POSTOPERATIVE INSTILLATION OF CHEMO THERAPY;  Surgeon: Jerilee Field, MD;  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 04/26/09   Anxiety    Arthritis    Bladder cancer (HCC)    Cataract    Cigarette smoker    COPD (chronic obstructive pulmonary disease) (HCC)    mild, no inhalers or oxygen used   Depression    History of kidney stones    Hyperlipidemia    Hypertension    Hypothyroidism    IBS (irritable bowel syndrome)    Mitral valve prolapse    mild takes beta blocker   Osteopenia    Stroke (HCC)    x 3 ministrokes last one feb 2014   Venous insufficiency    Vitamin D deficiency    BP (!) 156/74   Pulse (!) 59   Ht 5\' 2"  (1.575 m)   Wt 128 lb (58.1 kg)   SpO2 97%   BMI 23.41 kg/m   Opioid Risk Score:  Fall Risk Score:  `1  Depression screen [facility] 2/9  01/25/2023    9:28 AM 12/08/2022    7:55 AM 12/07/2022   11:05 AM 10/10/2022    9:19 AM 09/07/2022    8:18 AM 08/29/2022    9:58 AM 07/31/2022    2:07 PM  Depression screen PHQ 2/9  Decreased Interest 1 3 0 1 3 0 0  Down, Depressed, Hopeless 1 3 0 1 2 0 0  PHQ - 2 Score 2 6 0 2 5 0 0  Altered sleeping  3   1    Tired, decreased energy  3   3    Change in appetite  0   3    Feeling bad or failure about yourself   3   3    Trouble concentrating  0   3    Moving slowly or fidgety/restless  0   0    Suicidal thoughts  0   0    PHQ-9 Score  15   18    Difficult doing work/chores  Very difficult    Extremely dIfficult      Review of Systems  Musculoskeletal:  Positive for back pain.       Pain in both upper legs & left groin/hip area  All other systems reviewed and are negative.      Objective:   Physical Exam    Physical Exam Gen: no distress, normal appearing HEENT: oral mucosa pink and moist, NCAT Cardio: Reg rate Chest: normal effort, normal rate of breathing Abd: soft, non-distended Ext: no edema Psych: pleasant, appropriate  Skin: intact Neuro: Alert and awake, follows commands, cranial nerves II through XII grossly intact, speech and language normal Sensation to light touch in all 4 extremities Strength  5 out of 5 right lower extremity and hip flexion, knee extension, ankle PF and DF Strength 4+ out of 5 in left lower extremity hip flexion, knee extension, ankle PF and DF-she reports this is chronic since her prior surgery, unchanged Musculoskeletal: Slump negative  Tenderness to palpation left SI joint -unchanged Mild T-spine paraspinal tenderness Facet loading positive today     MRI T spine 05/01/22 FINDINGS: Alignment:  Physiologic.   Vertebrae: No acute fracture, evidence of discitis, or aggressive bone lesion. T7 vertebral body hemangioma noted.   Cord:  Normal signal and morphology.   Paraspinal and other soft tissues: No acute paraspinal abnormality.   Disc levels:   Disc spaces: Degenerative disease with disc height loss at T10-11 and T11-12. Degenerative disease with disc height loss at T1-2. Partially visualized on the scout images is degenerative disease with disc height loss at C3-4, C4-5 and C5-6.   T1-T2: Mild broad-based disc bulge. Severe right and moderate left foraminal stenosis. No spinal stenosis.   T2-T3: No disc protrusion, foraminal stenosis or central canal stenosis.   T3-T4: No disc protrusion, foraminal stenosis or central canal stenosis.   T4-T5: No disc protrusion, foraminal stenosis or central canal stenosis.    T5-T6: No disc protrusion, foraminal stenosis or central canal stenosis.   T6-T7: No disc protrusion, foraminal stenosis or central canal stenosis.   T7-T8: No disc protrusion, foraminal stenosis or central canal stenosis.   T8-T9: No disc protrusion, foraminal stenosis or central canal stenosis.   T9-T10: No disc protrusion, foraminal stenosis or central canal stenosis.   T10-T11: Mild broad-based disc bulge. No foraminal or central canal stenosis. Right perineural cyst noted.   T11-T12: No disc protrusion, foraminal stenosis or central canal stenosis.   IMPRESSION: 1. At T1-2 there is a  mild broad-based disc bulge. Severe right and moderate left foraminal stenosis. 2. No acute osseous injury of the thoracic spine.   L spine MRI 12/10/21 FINDINGS: Segmentation: Standard. Lowest well-formed disc space labeled the L5-S1 level.   Alignment: Up to 4 mm anterolisthesis of L5 on S1, stable. Trace anterolisthesis of L3 on L4, also relatively stable. Straightening of the normal lumbar lordosis elsewhere. Underlying sigmoid scoliosis.   Vertebrae: Susceptibility artifact from interval PLIF at L1-2. Vertebral body height maintained without acute or chronic fracture. Bone marrow signal intensity within normal limits. Few small benign hemangiomata noted. No worrisome osseous lesions. Mild reactive marrow edema noted about the partially visualized T10-11 interspace. Reactive edema present about the right L5-S1 facet due to facet arthritis.   Conus medullaris and cauda equina: Conus extends to the L1-2 level. Conus and cauda equina appear normal.   Paraspinal and other soft tissues: Postoperative changes within the posterior paraspinous soft tissues without adverse features. Few scattered cysts noted about the partially visualized kidneys, similar to prior, likely benign. No follow-up imaging recommended.   Disc levels:   T11-12: Seen only on sagittal projection. Mild disc  bulge with reactive endplate spurring. Left worse than right facet hypertrophy. No significant spinal stenosis. Foramina appear patent.   T12-L1: Chronic endplate Schmorl's node deformities without significant disc bulge. Mild right-sided facet hypertrophy. No significant stenosis.   L1-2: Interval PLIF. No residual or recurrent spinal stenosis. Foramina appear patent.   L2-3: Disc bulge with disc desiccation and intervertebral disc space narrowing. Disc bulging eccentric to the left. Reactive endplate spurring. Moderate left with mild right facet arthrosis. Resultant mild narrowing of the left lateral recess. Mild left L2 foraminal narrowing. Right neural foramen remains patent. Appearance is stable.   L3-4: Degenerative intervertebral disc space narrowing with diffuse disc bulge and disc desiccation. Disc bulging eccentric to the left with left-sided reactive endplate spurring. Resultant extraforaminal disc osteophyte contacts the exiting left L3 nerve root as it courses of the left neural foramen (series 101, image 20). Moderate left with mild right facet arthrosis. Resultant mild narrowing of the left lateral recess. Mild left L3 foraminal stenosis. Right neural foramen remains patent. Appearance is stable.   L4-5: Disc bulge with disc desiccation. Reactive endplate spurring. Mild to moderate facet hypertrophy. No more than mild narrowing of the lateral recesses bilaterally. Mild to moderate right with mild left L4 foraminal stenosis. Appearance is stable.   L5-S1: Anterolisthesis. Disc desiccation with broad posterior disc bulge. Severe right-sided facet arthrosis with reactive marrow edema. Moderate left-sided foraminal narrowing. No significant spinal stenosis. Mild right L5 foraminal stenosis. Appearance is similar.   IMPRESSION: 1. Interval PLIF at L1-2 without residual or recurrent stenosis. 2. Multilevel degenerative spondylosis elsewhere throughout the lumbar  spine with resultant mild left lateral recess and foraminal stenosis at L2-3 and L3-4, and mild to moderate bilateral foraminal narrowing at L4-5 and L5-S1 as above. 3. Advanced right-sided facet arthrosis at L5-S1 with associated reactive marrow edema, which could serve as a source for lower back pain.  T-spine         Assessment & Plan:   Chronic lumbar and thoracic back pain and failed back syndrome, I also suspect she has some SI joint dysfunction on the left -Hx of  L1-L2 TLIF -UDS and pain agreement completed prior visit -Recheck UDS -Continue hydrocodone at 5 mg dose Q4h PRN, no more than 3 doses a day #90 with each refill- call when low  -Pt seen by Dr. Wynn Banker for  left SI joint injection- helped for few days -Will place consult to Dr. Wynn Banker for medial branch blocks bilateral L3-L4, L4-L5, L5-S1 -Pt has allergy noted to hydrocodone in chart-she insists this is not correct and she does not have this allergy -Zynex TENS and Heat/Cold therapy device ordered prior visit -Will reorder gabapentin 100 mg - 200 mg 3 times daily.  Start with 100 mg 3 times daily and can increase to 200 mg 3 times daily if tolerating after a week -PT -patient reports this did not provide benefit   R anterior hip pain -Continue Voltaren gel -Consider imaging if doesn't improve   THC use history -Patient reports she is not currently using -Continue random UDS   HTN -Advised follow-up with PCP

## 2023-02-15 ENCOUNTER — Other Ambulatory Visit: Payer: Self-pay | Admitting: Family Medicine

## 2023-02-16 ENCOUNTER — Telehealth: Payer: Self-pay | Admitting: *Deleted

## 2023-02-16 ENCOUNTER — Telehealth: Payer: Self-pay

## 2023-02-16 MED ORDER — HYDROCODONE-ACETAMINOPHEN 10-325 MG PO TABS: 0.5000 | ORAL_TABLET | Freq: Three times a day (TID) | ORAL | 0 refills | Status: DC | PRN

## 2023-02-16 NOTE — Telephone Encounter (Signed)
[facility] is requesting another dose of Hydrocodone/APAP. They do not have 5-325 but do have 10-325 mg. Pt only has 1 pill left.

## 2023-02-16 NOTE — Telephone Encounter (Signed)
Patient called in stating that pharmacy didn't receive Rx for hydrocodone, are you able to resend to pharmacy ?

## 2023-02-16 NOTE — Telephone Encounter (Signed)
Pt informed

## 2023-02-16 NOTE — Telephone Encounter (Signed)
Pt requesting refill but Hydrocodone was refill #90 on 8/28. Left message with spouse to have her call office back.

## 2023-02-28 DIAGNOSIS — Z5111 Encounter for antineoplastic chemotherapy: Secondary | ICD-10-CM | POA: Diagnosis not present

## 2023-02-28 DIAGNOSIS — C672 Malignant neoplasm of lateral wall of bladder: Secondary | ICD-10-CM | POA: Diagnosis not present

## 2023-03-02 ENCOUNTER — Ambulatory Visit (INDEPENDENT_AMBULATORY_CARE_PROVIDER_SITE_OTHER): Payer: Medicare Other | Admitting: Orthopedic Surgery

## 2023-03-02 ENCOUNTER — Other Ambulatory Visit (INDEPENDENT_AMBULATORY_CARE_PROVIDER_SITE_OTHER): Payer: Medicare Other

## 2023-03-02 DIAGNOSIS — Z981 Arthrodesis status: Secondary | ICD-10-CM | POA: Diagnosis not present

## 2023-03-02 MED ORDER — METHYLPREDNISOLONE 4 MG PO TBPK: ORAL_TABLET | ORAL | 0 refills | Status: DC

## 2023-03-02 NOTE — Progress Notes (Signed)
Orthopedic Spine Surgery Office Note 2   Assessment: Patient is a 82 y.o. female status post L1/2 TLIF and PSIF for recurrent disc herniation.  Having significant, disabling low back pain and now having acute or right posterior hip pain     Plan: -Patient has tried physical therapy, home exercises, NSAIDs, Tylenol, steroid injections, patches, creams, operative intervention, pain management -Continue with pain management -Prescribed a medrol dose pak. Told her if she has side effects to stop the medication -Recommended diagnostic/therapeutic injection with Dr. Shon Baton to the right hip -Patient should return to office in 6 weeks, x-rays at next visit: right hip AP/lateral     Patient expressed understanding of the plan and all questions were answered to the patient's satisfaction.    ___________________________________________________________________________   History: Patient is a 82 y.o. female who has been previously seen in the office for comes in for low back pain and acute onset of right posterior hip pain.  She has a history of 2 lumbar surgeries.  She had an L1/2 microdiscectomy with recurrent disc herniation so she underwent L1/2 TLIF and PSIF at a later date.  She is still having significant low back pain but has not developed right posterior hip pain.  She said that this pain started on 02/28/2023 after she woke up.  She said there is no trauma or injury in the days preceding the onset of pain.  She said that this pain is more significant than her back pain and has caused her to forget about the back pain.  She has had difficulty walking due to the pain.  She is able to ambulate without assist devices.  Pain does not radiate down the leg.  She is having no left-sided hip or leg pain.   Previous treatments: physical therapy, home exercises, NSAIDs, Tylenol, steroid injections, patches, creams, operative intervention, pain management   Physical Exam:   General: no acute distress, appears  stated age Neurologic: alert, answering questions appropriately, following commands Respiratory: unlabored breathing on room air, symmetric chest rise Psychiatric: appropriate affect, normal cadence to speech     MSK (spine):   -Strength exam                                                   Left                  Right EHL                              4/5                  4/5 TA                                 4/5                  5/5 GSC                             4/5                  5/5 Knee extension            5/5  5/5 Hip flexion                    5/5                  4/5  Right hip flexion exam strength testing limited by pain   -Sensory exam                           Sensation intact to light touch in L3-S1 nerve distributions of bilateral lower extremities   -Achilles DTR: 1/4 on the left, 1/4 on the right -Patellar tendon DTR: 1/4 on the left, 1/4 on the right   -Straight leg raise: Negative bilaterally -Contralateral straight leg raise: Negative bilaterally -Clonus: no beats bilaterally  -Right hip exam: Positive FADIR, pain with internal rotation, no pain with external rotation, positive Stinchfield, no pain with logroll   Imaging: XRs of the lumbar spine from 03/02/2023 were independently reviewed and interpreted, showing L1/2 to interbody device with posterior instrumentation. No lucency seen around the screws or interbody device. Interbody and screws in similar position to prior films in 08/2022.  No fracture or dislocation seen.  No evidence of instability on flexion/extension views.       Patient name: Ana Santos Patient MRN: [id number] Date of visit: 03/02/23

## 2023-03-07 DIAGNOSIS — C672 Malignant neoplasm of lateral wall of bladder: Secondary | ICD-10-CM | POA: Diagnosis not present

## 2023-03-07 DIAGNOSIS — Z5111 Encounter for antineoplastic chemotherapy: Secondary | ICD-10-CM | POA: Diagnosis not present

## 2023-03-09 ENCOUNTER — Encounter: Payer: Self-pay | Admitting: Physical Medicine & Rehabilitation

## 2023-03-09 ENCOUNTER — Encounter: Payer: Medicare Other | Attending: Physical Medicine & Rehabilitation | Admitting: Physical Medicine & Rehabilitation

## 2023-03-09 VITALS — Ht 62.0 in | Wt 131.0 lb

## 2023-03-09 DIAGNOSIS — M47817 Spondylosis without myelopathy or radiculopathy, lumbosacral region: Secondary | ICD-10-CM | POA: Diagnosis not present

## 2023-03-09 MED ORDER — LIDOCAINE HCL 1 % IJ SOLN
10.0000 mL | Freq: Once | INTRAMUSCULAR | Status: AC
Start: 2023-03-09 — End: 2023-03-09
  Administered 2023-03-09: 10 mL

## 2023-03-09 MED ORDER — LIDOCAINE HCL (PF) 2 % IJ SOLN
5.0000 mL | Freq: Once | INTRAMUSCULAR | Status: AC
Start: 2023-03-09 — End: 2023-03-09
  Administered 2023-03-09: 5 mL

## 2023-03-09 MED ORDER — IOHEXOL 180 MG/ML  SOLN
3.0000 mL | Freq: Once | INTRAMUSCULAR | Status: AC
  Administered 2023-03-09: 3 mL

## 2023-03-09 NOTE — Progress Notes (Signed)
Bilateral Lumbar L2, L3, L4  medial branch blocks and L 5 dorsal ramus injection under fluoroscopic guidance  Indication: Lumbar pain which is not relieved by medication management or other conservative care and interfering with self-care and mobility.No iohexol allergy   Informed consent was obtained after describing risks and benefits of the procedure with the patient, this includes bleeding, infection, paralysis and medication side effects.  The patient wishes to proceed and has given written consent.  The patient was placed in prone position.  The lumbar area was marked and prepped with Betadine.  One mL of 1% lidocaine was injected into each of 6 areas into the skin and subcutaneous tissue.  Then a 22-gauge 3.5in spinal needle was inserted targeting the junction of the left S1 superior articular process and sacral ala junction. Needle was advanced under fluoroscopic guidance.  Bone contact was made.Omnipaque 180 was injected x 0.5 mL demonstrating no intravascular uptake.  Then a solution  of 2% MPF lidocaine was injected x 0.5 mL.  Then the left L5 superior articular process in transverse process junction was targeted.  Bone contact was made. Omnipaque 180 was injected x 0.5 mL demonstrating no intravascular uptake. Then a solution containing  2% MPF lidocaine was injected x 0.5 mL.  Then the left L4 superior articular process in transverse process junction was targeted.  Bone contact was made. Omnipaque 180 was injected x 0.5 mL demonstrating no intravascular uptake.  Then a solution containing2% MPF lidocaine was injected x 0.5 mL.  Then the left L3 superior articular process in transverse process junction was targeted.  Bone contact was made. Omnipaque 180 was injected x 0.5 mL demonstrating no intravascular uptake.  Then a solution containing2% MPF lidocaine was injected x 0.5 mL.This same procedure was performed on the right side using the same needle, technique and injectate.  Patient tolerated  procedure well.  Post procedure instructions were given.   Lidocaine 1% with preservative multidose, 10ml no waste Lidocaine 2% MPF, 5ml bottle, 4ml used 1ml waste Omnipaque 180 3 ml used,

## 2023-03-09 NOTE — Patient Instructions (Signed)

## 2023-03-09 NOTE — Progress Notes (Signed)
PROCEDURE RECORD [facility]   Name: Ana Santos DOB:02/15/1941 MRN: [id number]  Date:03/09/2023  Physician: Claudette Laws, MD    Nurse/CMA: Nedra Hai, CMA  Allergies:  Allergies  Allergen Reactions   Prednisone Other (See Comments)    Hallucinations   Atorvastatin Other (See Comments)     Lipitor caused arm pain (3/09)   Hydrocodone-Acetaminophen Other (See Comments)    hallucinations   Morphine Other (See Comments)    hallucinations   Nortriptyline Nausea And Vomiting    Caused nausea and vomiting and shaking, kept her up all night   Topamax [Topiramate] Nausea And Vomiting    *dizziness*   Tramadol Other (See Comments)    withdrawal    Consent Signed: Yes.    Is patient diabetic? No.  CBG today? .  Pregnant: No. LMP: No LMP recorded. Patient has had a hysterectomy. (age 55-55)  Anticoagulants: yes (Plavix) Anti-inflammatory: no Antibiotics: no  Procedure: L3-4, L4-5, L5-S1 Medial Branch Block  Position: Prone Start Time: 9:57 am  End Time: 10:14 am  Fluoro Time: 1:13  RN/CMA Andee Lineman, CMA    Time 9:35 am 10:20 am    BP 160/79 148 /71    Pulse 67 65    Respirations 16 16    O2 Sat 97 96    S/S 6 6    Pain Level 5/10 2/10     D/C home with husband, [name] & O X 3, D/C instructions reviewed, and sits independently.

## 2023-03-12 ENCOUNTER — Other Ambulatory Visit: Payer: Self-pay

## 2023-03-12 ENCOUNTER — Ambulatory Visit (INDEPENDENT_AMBULATORY_CARE_PROVIDER_SITE_OTHER): Payer: Medicare Other | Admitting: Family Medicine

## 2023-03-12 VITALS — BP 130/66 | HR 88 | Temp 98.1°F | Ht 62.0 in | Wt 132.2 lb

## 2023-03-12 DIAGNOSIS — M47817 Spondylosis without myelopathy or radiculopathy, lumbosacral region: Secondary | ICD-10-CM | POA: Diagnosis not present

## 2023-03-12 DIAGNOSIS — C679 Malignant neoplasm of bladder, unspecified: Secondary | ICD-10-CM | POA: Insufficient documentation

## 2023-03-12 DIAGNOSIS — E039 Hypothyroidism, unspecified: Secondary | ICD-10-CM

## 2023-03-12 DIAGNOSIS — I10 Essential (primary) hypertension: Secondary | ICD-10-CM | POA: Diagnosis not present

## 2023-03-12 DIAGNOSIS — Z23 Encounter for immunization: Secondary | ICD-10-CM

## 2023-03-12 LAB — TSH: TSH: 4.44 u[IU]/mL (ref 0.35–5.50)

## 2023-03-12 MED ORDER — LEVOTHYROXINE SODIUM 50 MCG PO TABS
50.0000 ug | ORAL_TABLET | Freq: Every day | ORAL | 3 refills | Status: DC
Start: 2023-03-12 — End: 2024-01-01

## 2023-03-12 MED ORDER — LEVOTHYROXINE SODIUM 25 MCG PO TABS
12.5000 ug | ORAL_TABLET | Freq: Every day | ORAL | 3 refills | Status: DC
Start: 2023-03-12 — End: 2024-03-17

## 2023-03-12 NOTE — Assessment & Plan Note (Signed)
Blood pressure is in good control. Continue amlodipine 5 mg daily. 

## 2023-03-12 NOTE — Assessment & Plan Note (Addendum)
Working with orthopedics and physiatry. Recent injection has not been very helpful. Also recently completed a Medrol dosepak.

## 2023-03-12 NOTE — Progress Notes (Signed)
[facility] [address] [place] [address] Dept: [phone] Dept Fax: [phone]  Chronic Care Office Visit  Subjective:    Patient ID: Ana Santos, female    DOB: May 08, 1941, 82 y.o..   MRN: [id number]  Chief Complaint  Patient presents with   Hypertension    3 month f/u.     History of Present Illness:  Patient is in today for reassessment of chronic medical issues.  Ms. Schenkel has a history of hypertension, managed with amlodipine 5 mg daily. She also has hyperlipidemia, which is managed with pravastatin 40 mg daily.   Ms. Tsutsumi has a history of hypothyroidism. Her last TSH was elevated. She is now taking 50 mcg daily and 25 mcg 1/2 tab daily (total daily dose of 62.5 mcg).      Ms. Wurts has a history of a fall in Sept. 2021. An MRI scan in may showed multi-level degenerative lumbar disease with foraminal stenoses. She had multiple ESIs without improvement. She had a left L1-L2 microdiscectomy on 02/28/2021. She had a lumbar fusion on 08/23/2021 due to recurrent disc herniation s/p microdiscectomy. She has had further ESIs and is now being seen by pain management ([facility]). She is being managed on Vicodin 5-325 mg TID.   Ms. Hooke has a history of bladder cancer.  She underwent cystoscopy and biopsies in August, which did show recurrence of noninvasive, high-grade papillary urothelial carcinoma. She is now back on a course of chemotherapy.  Past Medical History: Patient Active Problem List   Diagnosis Date Noted   Bladder cancer (HCC) 03/12/2023   Oxygen desaturation 08/24/2021   Other secondary scoliosis, lumbar region 08/23/2021    Class: Chronic   Fusion of spine of lumbar region 08/23/2021   Lumbar post-laminectomy syndrome 06/15/2021   Status post lumbar microdiscectomy 03/09/2021   Recurrent herniation of lumbar disc 02/28/2021   Foraminal stenosis due to intervertebral disc disease 01/21/2021    History of bladder cancer 01/21/2021   Labyrinthitis 12/08/2019   Left-sided low back pain without sciatica 05/05/2019   Visual loss, transient, bilateral 02/21/2019   Essential hypertension 02/21/2019   Colonic mass s/p lap ileocectomy 03/14/2018 03/14/2018   Acute lower GI bleeding 03/14/2018   Chronic anticoagulation 03/14/2018   Chronic tension-type headache, not intractable 05/31/2015   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) 03/11/2014   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 Corliss Skains   APPENDECTOMY     BACK SURGERY  02/28/2021   BACK SURGERY     08/2021   BREAST LUMPECTOMY Right 1986   benign   CYSTOSCOPY W/ RETROGRADES Bilateral 01/19/2023   Procedure: BILATERAL RETROGRADE PYELOGRAM;  Surgeon: Jerilee Field, MD;  Location: [facility];  Service: Urology;  Laterality: Bilateral;   CYSTOSCOPY WITH BIOPSY N/A 01/19/2023   Procedure: CYSTOSCOPY WITH BLADDER BIOPSY POST OP GEMCITABINE;  Surgeon: Jerilee Field, MD;  Location: [facility];  Service: Urology;  Laterality: N/A;   CYSTOSCOPY WITH FULGERATION  N/A 01/19/2023   Procedure: CYSTOSCOPY WITH FULGERATION;  Surgeon: Jerilee Field, MD;  Location: [facility];  Service: Urology;  Laterality: N/A;  75 MINS FOR CASE   DIAGNOSTIC LAPAROSCOPY     Proable laparoscopic right colectomy 03-14-18 Dr. Johna Sheriff   EYE SURGERY Bilateral    ioc lens for cataracts    LAPAROSCOPIC RIGHT COLECTOMY Right 03/14/2018   Procedure: LAPAROSCOPIC ASSISTED  RIGHT COLECTOMY;  Surgeon: Glenna Fellows, MD;  Location: [facility];  Service: General;  Laterality: Right;   LAPAROSCOPY N/A 03/14/2018   Procedure: LAPAROSCOPY;  Surgeon: Glenna Fellows, MD;  Location: [facility];  Service: General;  Laterality: N/A;   LEFT HEART CATHETERIZATION WITH CORONARY ANGIOGRAM N/A 08/05/2014   Procedure: LEFT HEART CATHETERIZATION WITH CORONARY ANGIOGRAM;  Surgeon: Iran Ouch, MD;  Location: [facility];  Service: Cardiovascular;  Laterality: N/A;   LUMBAR LAMINECTOMY N/A 02/28/2021   Procedure: LEFT L1-L2 MICRODISCECTOMY AND FASCIECTOMY;  Surgeon: Eldred Manges, MD;  Location: [facility];  Service: Orthopedics;  Laterality: N/A;   TONSILLECTOMY     TOTAL ABDOMINAL HYSTERECTOMY  1970   complete   TRANSLABYRINTHINE PROCEDURE  2002   [facility] Dr. Erroll Luna tumor removal   TRANSURETHRAL RESECTION OF BLADDER TUMOR N/A 02/08/2018   Procedure: TRANSURETHRAL RESECTION OF BLADDER TUMOR (TURBT)/ POSTOPERATIVE INSTILLATION OF CHEMO THERAPY;  Surgeon: Jerilee Field, MD;  Location: [facility];  Service: Urology;  Laterality: N/A;   Family History  Problem Relation Age of Onset   Heart disease Mother    Cancer Mother        Lung   Heart attack Mother    Diabetes Mother    Cancer Father        Lung   Diabetes Sister    Cancer Sister        unkown type   Diabetes Brother    Lung cancer Brother        Lung   AAA (abdominal aortic aneurysm) Neg Hx    Colon cancer Neg Hx    Stomach cancer Neg Hx    Esophageal cancer Neg Hx    Liver cancer Neg Hx    Pancreatic cancer Neg Hx    Rectal cancer Neg Hx    Outpatient Medications Prior to Visit  Medication Sig Dispense Refill   amLODipine (NORVASC) 5 MG tablet TAKE 1 TABLET DAILY 90 tablet 3   Cholecalciferol (VITAMIN D-3) 125 MCG (5000 UT) TABS Take 5,000 Units by mouth daily.     clopidogrel (PLAVIX) 75 MG tablet TAKE 1  TABLET DAILY 90 tablet 3   diclofenac Sodium (VOLTAREN ARTHRITIS PAIN) 1 % GEL Apply 4 g topically 4 (four) times daily. 150 g 3   gabapentin (NEURONTIN) 100 MG capsule Take 1-2 capsules (100-200 mg total) by mouth 3 (three) times daily. 180 capsule 2   HYDROcodone-acetaminophen (NORCO) 10-325 MG tablet Take 0.5 tablets by mouth every 8 (eight) hours as needed for moderate pain. 45 tablet 0   hydroxypropyl methylcellulose / hypromellose (ISOPTO TEARS / GONIOVISC) 2.5 % ophthalmic solution Place 1 drop into both eyes at bedtime.     levothyroxine (SYNTHROID) 25 MCG tablet Take 0.5 tablets (12.5 mcg total) by mouth daily. Take together with 50 mcg tablet (total daily dose of 62.5 mcg) 90 tablet 3   methylPREDNISolone (MEDROL DOSEPAK) 4 MG TBPK tablet Take as prescribed on the box 21 tablet 0   pramipexole (MIRAPEX) 0.5 MG tablet Take one nightly one hour prior to going to bed as needed  for restless leg syndrome. 90 tablet 4   pravastatin (PRAVACHOL) 40 MG tablet TAKE 1 TABLET DAILY 90 tablet 2   sertraline (ZOLOFT) 50 MG tablet Take 1.5 tablets (75 mg total) by mouth daily. 135 tablet 3   levothyroxine (SYNTHROID) 50 MCG tablet Take 1 tablet (50 mcg total) by mouth daily. (Patient not taking: Reported on 03/12/2023) 90 tablet 3   SYNTHROID 75 MCG tablet Take 75 mcg by mouth daily.     Facility-Administered Medications Prior to Visit  Medication Dose Route Frequency Provider Last Rate Last Admin   gemcitabine (GEMZAR) chemo syringe for bladder instillation 2,000 mg  2,000 mg Bladder Instillation Once Jerilee Field, MD       Allergies  Allergen Reactions   Prednisone Other (See Comments)    Hallucinations   Atorvastatin Other (See Comments)     Lipitor caused arm pain (3/09)   Hydrocodone-Acetaminophen Other (See Comments)    hallucinations   Morphine Other (See Comments)    hallucinations   Nortriptyline Nausea And Vomiting    Caused nausea and vomiting and shaking, kept her up all night    Topamax [Topiramate] Nausea And Vomiting    *dizziness*   Tramadol Other (See Comments)    withdrawal   Objective:   Today's Vitals   03/12/23 0753  BP: 130/66  Pulse: 88  Temp: 98.1 F (36.7 C)  TempSrc: Temporal  SpO2: 99%  Weight: 132 lb 3.2 oz (60 kg)  Height: 5\' 2"  (1.575 m)   Body mass index is 24.18 kg/m.   General: Well developed, well nourished. No acute distress. Psych: Alert and oriented. Normal mood and affect.  Health Maintenance Due  Topic Date Due   DTaP/Tdap/Td (1 - Tdap) Never done   Zoster Vaccines- Shingrix (1 of 2) Never done   INFLUENZA VACCINE  01/11/2023     Assessment & Plan:   Problem List Items Addressed This Visit       Cardiovascular and Mediastinum   Essential hypertension    Blood pressure is in good control. Continue amlodipine 5 mg daily.         Endocrine   Hypothyroidism    TSH was high at last visit. Continue levothyroxine 50 mcg daily and 1/2 of a 25 mcg tablet daily. I will reassess her TSH today.      Relevant Orders   TSH     Musculoskeletal and Integument   Lumbosacral spondylosis without myelopathy    Working with orthopedics and physiatry. Recent injection has not been very helpful. Also recently completed a Medrol dosepak.        Genitourinary   Bladder cancer (HCC) - Primary    Recurrence of bladder cancer. Undergoing chemotherapy with urology.       Return in about 3 months (around 06/11/2023) for Reassessment.   Loyola Mast, MD

## 2023-03-12 NOTE — Assessment & Plan Note (Signed)
TSH was high at last visit. Continue levothyroxine 50 mcg daily and 1/2 of a 25 mcg tablet daily. I will reassess her TSH today.

## 2023-03-12 NOTE — Assessment & Plan Note (Signed)
Recurrence of bladder cancer. Undergoing chemotherapy with urology.

## 2023-03-12 NOTE — Addendum Note (Signed)
Addended by: Waymond Cera on: 03/12/2023 09:07 AM   Modules accepted: Orders

## 2023-03-13 ENCOUNTER — Other Ambulatory Visit: Payer: Self-pay

## 2023-03-13 ENCOUNTER — Encounter: Payer: Self-pay | Admitting: Sports Medicine

## 2023-03-13 ENCOUNTER — Ambulatory Visit (INDEPENDENT_AMBULATORY_CARE_PROVIDER_SITE_OTHER): Payer: Medicare Other | Admitting: Sports Medicine

## 2023-03-13 DIAGNOSIS — G8929 Other chronic pain: Secondary | ICD-10-CM | POA: Diagnosis not present

## 2023-03-13 DIAGNOSIS — M961 Postlaminectomy syndrome, not elsewhere classified: Secondary | ICD-10-CM

## 2023-03-13 DIAGNOSIS — M25551 Pain in right hip: Secondary | ICD-10-CM | POA: Diagnosis not present

## 2023-03-13 MED ORDER — METHYLPREDNISOLONE ACETATE 40 MG/ML IJ SUSP
80.0000 mg | INTRAMUSCULAR | Status: AC | PRN
Start: 2023-03-13 — End: 2023-03-13
  Administered 2023-03-13: 80 mg via INTRA_ARTICULAR

## 2023-03-13 MED ORDER — LIDOCAINE HCL 1 % IJ SOLN
4.0000 mL | INTRAMUSCULAR | Status: AC | PRN
Start: 2023-03-13 — End: 2023-03-13
  Administered 2023-03-13: 4 mL

## 2023-03-13 NOTE — Progress Notes (Signed)
Office Visit Note   Patient: Ana Santos           Date of Birth: 1940/12/11           MRN: [id number] Visit Date: 03/13/2023              Requested by: Loyola Mast, MD [address] [place],  [place] [address] PCP: Loyola Mast, MD  Medical Resident, Sports Medicine Fellow - Attending Physician Addendum:   I have independently interviewed and examined the patient myself. I have discussed the above with the original author and agree with their documentation. My edits for correction/addition/clarification have been made, see any changes above and below.   In summary, very pleasant 82 year old female who presents with a chronic history of back pain with multiple spinal surgeries, having chronic right posterior hip pain x 3 years.  Through shared decision making, elected to proceed with diagnostic and hopefully therapeutic intra-articular injection of the right hip under ultrasound guidance.  Patient tolerated well.  We will see over the next 1-2 weeks what degree of improvement she receives.  She did have some TTP over the SI joint as well, could consider this in the future if the Injection does not settle down her pain.  She will continue routine follow-up with Dr. Christell Constant.  Okay to continue Tylenol, ice for any postinjection pain.  She does have Norco which she receives from her pain management doctor.  Madelyn Brunner, DO Primary Care Sports Medicine Physician  [facility] - Orthopedics   Assessment & Plan: Visit Diagnoses:  1. Pain in right hip    Plan: Patient who is presenting with right sided hip pain that is consistent with degenerative changes of the hip. Lumbar x-rays do show some medial narrowing of the femoral acetabular junction, although true hip x-rays have not been performed yet.  Given patient's symptoms which have lasted approximately 3 years, patient would benefit from diagnostic and hopefully therapeutic injection at this time.  Patient tolerated  steroid injection of the right hip under ultrasound guidance without any difficulties.  Patient was advised to evaluate her pain in approximately 2 to 3 weeks.  Patient was also noted to have some significant tenderness of the right SI joint to palpation, if patient has minimal relief in 2 to 3 weeks can also consider doing an SI joint injection.  Patient to follow-up with Dr. Christell Constant regarding her back pain.  Follow-Up Instructions: No follow-ups on file.   Orders:  Orders Placed This Encounter  Procedures   Large Joint Inj   US Guided Needle Placement - No Linked Charges   No orders of the defined types were placed in this encounter.     Procedures: Large Joint Inj: R hip joint on 03/13/2023 8:55 AM Indications: pain Details: 22 G 3.5 in needle, ultrasound-guided anterior approach Medications: 80 mg methylPREDNISolone acetate 40 MG/ML; 4 mL lidocaine 1 % Outcome: tolerated well, no immediate complications  Procedure: US-guided intra-articular hip injection, RIGHT After discussion on risks/benefits/indications and informed verbal consent was obtained, a timeout was performed. Patient was lying supine on exam table. The hip was cleaned with betadine and alcohol swabs. Then utilizing ultrasound guidance, the patient's femoral head and neck junction was identified and subsequently injected with 4:2 lidocaine:depomedrol via an in-plane approach with ultrasound visualization of the injectate administered into the hip joint. Patient tolerated procedure well without immediate complications.  Procedure, treatment alternatives, risks and benefits explained, specific risks discussed. Consent was given by  the patient and spouse. Immediately prior to procedure a time out was called to verify the correct patient, procedure, equipment, support staff and site/side marked as required. Patient was prepped and draped in the usual sterile fashion.       Clinical Data: No additional  findings.   Subjective: Chief Complaint  Patient presents with   Right Hip - Pain    Injection    Patient is presenting with approximately 3-year history of right hip pain.  Patient has had multiple back surgeries and states that she recently saw a new back surgeon who recommended she be seen regarding her hip but she has been dealing with degenerative changes for quite some time.  Patient states that the pain is worse at night but is bothersome throughout the day all day long.  Patient does see pain management and does take Norco's for pain.  Patient is also on Plavix.  Patient has never had an injection of her hip before.    Review of Systems   Objective:  Physical Exam Hip, Right:  No obvious rash, erythema, ecchymosis, or edema. Passive Log Roll w/ restriction due to pain. ROM is decreased due to pain in flexion and extension; Strength 4/5 in all planes due to pain. Greater trochanter with mild tenderness to palpation. No tenderness over piriformis. There is significant SI joint tenderness of the right side to paplation.  Provocative Testing:    - FABER/FADIR test: POS/POS    Ortho Exam  Specialty Comments:  No specialty comments available.  Imaging: No results found.  Lumbar Xray from 09/01/2022:AP view of the lumbar spine does show clear. Right hip.  There is noted narrowing of the medial joint space of the right hip particular in the weightbearing area consistent with moderate osteoarthritis.  Femoral head contour is preserved.  There is some sclerosis of the acetabulum.   Impression: Moderate osteoarthritis of the right hip with some medial joint space narrowing.    PMFS History: Patient Active Problem List   Diagnosis Date Noted   Bladder cancer (HCC) 03/12/2023   Other secondary scoliosis, lumbar region 08/23/2021    Class: Chronic   Fusion of spine of lumbar region 08/23/2021   Lumbar post-laminectomy syndrome 06/15/2021   Lumbosacral spondylosis without  myelopathy 02/28/2021   Foraminal stenosis due to intervertebral disc disease 01/21/2021   History of bladder cancer 01/21/2021   Labyrinthitis 12/08/2019   Left-sided low back pain without sciatica 05/05/2019   Visual loss, transient, bilateral 02/21/2019   Essential hypertension 02/21/2019   Colonic mass s/p lap ileocectomy 03/14/2018 03/14/2018   Acute lower GI bleeding 03/14/2018   Chronic anticoagulation 03/14/2018   Chronic tension-type headache, not intractable 05/31/2015   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) 03/11/2014   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   RLS (restless legs syndrome) 07/04/2012   History of cerebral aneurysm 03/26/2009   Vitamin D deficiency 08/10/2008   Venous insufficiency 08/10/2008   History of acoustic neuroma 08/18/2007   Hypothyroidism 08/18/2007   Tobacco use disorder, continuous 08/18/2007   Mixed hyperlipidemia 07/18/2007   Anxiety with depression 07/18/2007   COPD (chronic obstructive pulmonary disease) with chronic bronchitis (HCC) 07/18/2007   Fibromyalgia 07/18/2007   Past Medical History:  Diagnosis Date   AN (acoustic neuroma) (HCC)    deafness in R ear   Aneurysm (HCC)    basilar tip aneursym s/p stent assisted coiling 04/26/09   Anxiety    Arthritis    Bladder cancer (HCC)  Cataract    Cigarette smoker    COPD (chronic obstructive pulmonary disease) (HCC)    mild, no inhalers or oxygen used   Depression    History of kidney stones    Hyperlipidemia    Hypertension    Hypothyroidism    IBS (irritable bowel syndrome)    Mitral valve prolapse    mild takes beta blocker   Osteopenia    Stroke (HCC)    x 3 ministrokes last one feb 2014   Venous insufficiency    Vitamin D deficiency     Family History  Problem Relation Age of Onset   Heart disease Mother    Cancer Mother        Lung   Heart attack Mother    Diabetes Mother    Cancer Father        Lung   Diabetes  Sister    Cancer Sister        unkown type   Diabetes Brother    Lung cancer Brother        Lung   AAA (abdominal aortic aneurysm) Neg Hx    Colon cancer Neg Hx    Stomach cancer Neg Hx    Esophageal cancer Neg Hx    Liver cancer Neg Hx    Pancreatic cancer Neg Hx    Rectal cancer Neg Hx     Past Surgical History:  Procedure Laterality Date   ANEURYSM COILING  04/2009   Dr. Corliss Skains   APPENDECTOMY     BACK SURGERY  02/28/2021   BACK SURGERY     08/2021   BREAST LUMPECTOMY Right 1986   benign   CYSTOSCOPY W/ RETROGRADES Bilateral 01/19/2023   Procedure: BILATERAL RETROGRADE PYELOGRAM;  Surgeon: Jerilee Field, MD;  Location: [facility];  Service: Urology;  Laterality: Bilateral;   CYSTOSCOPY WITH BIOPSY N/A 01/19/2023   Procedure: CYSTOSCOPY WITH BLADDER BIOPSY POST OP GEMCITABINE;  Surgeon: Jerilee Field, MD;  Location: [facility];  Service: Urology;  Laterality: N/A;   CYSTOSCOPY WITH FULGERATION N/A 01/19/2023   Procedure: CYSTOSCOPY WITH FULGERATION;  Surgeon: Jerilee Field, MD;  Location: [facility];  Service: Urology;  Laterality: N/A;  75 MINS FOR CASE   DIAGNOSTIC LAPAROSCOPY     Proable laparoscopic right colectomy 03-14-18 Dr. Johna Sheriff   EYE SURGERY Bilateral    ioc lens for cataracts   LAPAROSCOPIC RIGHT COLECTOMY Right 03/14/2018   Procedure: LAPAROSCOPIC ASSISTED  RIGHT COLECTOMY;  Surgeon: Glenna Fellows, MD;  Location: [facility];  Service: General;  Laterality: Right;   LAPAROSCOPY N/A 03/14/2018   Procedure: LAPAROSCOPY;  Surgeon: Glenna Fellows, MD;  Location: [facility];  Service: General;  Laterality: N/A;   LEFT HEART CATHETERIZATION WITH CORONARY ANGIOGRAM N/A 08/05/2014   Procedure: LEFT HEART CATHETERIZATION WITH CORONARY ANGIOGRAM;  Surgeon: Iran Ouch, MD;  Location: [facility];  Service: Cardiovascular;  Laterality: N/A;   LUMBAR LAMINECTOMY N/A 02/28/2021   Procedure: LEFT L1-L2 MICRODISCECTOMY AND FASCIECTOMY;  Surgeon: Eldred Manges, MD;  Location:  [facility];  Service: Orthopedics;  Laterality: N/A;   TONSILLECTOMY     TOTAL ABDOMINAL HYSTERECTOMY  1970   complete   TRANSLABYRINTHINE PROCEDURE  2002   [facility] Dr. Erroll Luna tumor removal   TRANSURETHRAL RESECTION OF BLADDER TUMOR N/A 02/08/2018   Procedure: TRANSURETHRAL RESECTION OF BLADDER TUMOR (TURBT)/ POSTOPERATIVE INSTILLATION OF CHEMO THERAPY;  Surgeon: Jerilee Field, MD;  Location[facility];  Service: Urology;  Laterality: N/A;   Social History   Occupational  History   Occupation: alterations   Occupation: Retired    Comment: Neurosurgeon  Tobacco Use   Smoking status: Every Day    Current packs/day: 0.25    Average packs/day: 0.3 packs/day for 50.0 years (12.5 ttl pk-yrs)    Types: Cigarettes   Smokeless tobacco: Never   Tobacco comments:    4-5 cigs daily  is aware she needs to quit  Vaping Use   Vaping status: Never Used  Substance and Sexual Activity   Alcohol use: Never   Drug use: Never   Sexual activity: Not Currently

## 2023-03-14 ENCOUNTER — Telehealth: Payer: Self-pay | Admitting: Sports Medicine

## 2023-03-14 DIAGNOSIS — C672 Malignant neoplasm of lateral wall of bladder: Secondary | ICD-10-CM | POA: Diagnosis not present

## 2023-03-14 DIAGNOSIS — Z5111 Encounter for antineoplastic chemotherapy: Secondary | ICD-10-CM | POA: Diagnosis not present

## 2023-03-14 NOTE — Telephone Encounter (Signed)
Patient called and said THANK YOU, THANK YOU, THANK YOU!

## 2023-03-21 ENCOUNTER — Encounter: Payer: Self-pay | Admitting: Family Medicine

## 2023-03-21 DIAGNOSIS — Z5111 Encounter for antineoplastic chemotherapy: Secondary | ICD-10-CM | POA: Diagnosis not present

## 2023-03-21 DIAGNOSIS — C672 Malignant neoplasm of lateral wall of bladder: Secondary | ICD-10-CM | POA: Diagnosis not present

## 2023-03-22 ENCOUNTER — Telehealth: Payer: Self-pay | Admitting: Specialist

## 2023-03-22 NOTE — Telephone Encounter (Signed)
Patient is a patient of Dr. Natale Lay. She needs a refill request for hydrocodone called in to her pharmacy please. Shirlean Mylar, MHA, OT/L [phone]

## 2023-03-23 MED ORDER — HYDROCODONE-ACETAMINOPHEN 5-325 MG PO TABS: 1.0000 | ORAL_TABLET | Freq: Three times a day (TID) | ORAL | 0 refills | Status: DC | PRN

## 2023-03-28 ENCOUNTER — Other Ambulatory Visit: Payer: Self-pay | Admitting: Family Medicine

## 2023-03-28 DIAGNOSIS — C672 Malignant neoplasm of lateral wall of bladder: Secondary | ICD-10-CM | POA: Diagnosis not present

## 2023-03-28 DIAGNOSIS — G2581 Restless legs syndrome: Secondary | ICD-10-CM

## 2023-03-28 DIAGNOSIS — Z5111 Encounter for antineoplastic chemotherapy: Secondary | ICD-10-CM | POA: Diagnosis not present

## 2023-03-29 ENCOUNTER — Encounter: Payer: Medicare Other | Attending: Physical Medicine & Rehabilitation | Admitting: Physical Medicine & Rehabilitation

## 2023-03-29 ENCOUNTER — Encounter: Payer: Self-pay | Admitting: Physical Medicine & Rehabilitation

## 2023-03-29 VITALS — BP 129/76 | HR 82 | Ht 62.0 in | Wt 129.0 lb

## 2023-03-29 DIAGNOSIS — M25551 Pain in right hip: Secondary | ICD-10-CM | POA: Diagnosis not present

## 2023-03-29 DIAGNOSIS — M961 Postlaminectomy syndrome, not elsewhere classified: Secondary | ICD-10-CM | POA: Diagnosis not present

## 2023-03-29 DIAGNOSIS — M533 Sacrococcygeal disorders, not elsewhere classified: Secondary | ICD-10-CM

## 2023-03-29 DIAGNOSIS — Z79891 Long term (current) use of opiate analgesic: Secondary | ICD-10-CM | POA: Diagnosis not present

## 2023-03-29 NOTE — Progress Notes (Signed)
Subjective:    Ana Santos ID: Ana Santos, female    DOB: [birth year], 82 y.o.   MRN: [id number]  HPI    HPI Ana Santos is a 82 y.o. year old female  who  has a past medical history of AN (acoustic neuroma) (HCC), Aneurysm (HCC), Anxiety, Bladder cancer (HCC), Cataract, Cigarette smoker, COPD (chronic obstructive pulmonary disease) (HCC), Coronary artery disease, Depression, Headache, Hyperlipidemia, Hypertension, Hypothyroidism, IBS (irritable bowel syndrome), Mitral valve prolapse, Osteopenia, Stroke (HCC), Venous insufficiency, and Vitamin D deficiency.   They are presenting to PM&R clinic as a new Ana Santos for pain management evaluation. They were referred for treatment of chronic lumbar and thoracic back pain.   She reports that she has had worsening pain for about 4 years.  No consistent pain shooting down her legs.  Pain is stabbing in quality.  Lifting anything makes it worse.  Denies any numbness or paresthesias in her limbs.  She had a prior left L1-2 hemilaminectomy and then later a L1-L2 TLIF in March 2023.  She is previously followed by Dr. Otelia Sergeant.  She is now being seen by Dr. Willia Craze orthopedic spine surgery.  She reports a history of bowel surgery with small intestine removed previously.   Red flag symptoms: Ana Santos denies saddle anesthesia, loss of bowel or bladder continence, new weakness, new numbness/tingling, or pain waking up at nighttime.   Medications tried: Tylenol, advil, lidcaine patch, icy hot with mild benefit She is currently on Plavix which limits her medication options Hydrocodone 10 mg previously helped control her pain  tramadol-this did help her pain however she had very severe withdrawal issues when she did not get her medication on time, would like to avoid this medication if possible Oxycodone helped her pain previously   Other treatments: PT/OT  made it worse Heat pad helps 10-15 ESI injections- reports minimal benefit  L1-2 hemilaminectomy  and then later a L1-L2 TLIF in March 2023.  Without sustained improvement   Interval history 06/27/2022 Ana Santos is here for follow-up regarding her chronic pain.  She continues to have lumbar and thoracic back pain.  She says the pain has been very severe since medications have been discontinued.  She did get some tramadol at the end of December which provided mild benefit to her pain.  She denies any use of other substances or marijuana since her last visit.  She says she regrets having her back surgery done last year.   Interval history 08/29/2022 Ana Santos is here for follow-up regarding her chronic back pain primarily in her lumbar and thoracic back.  She also recently had a left SI joint injection by Dr. Wynn Banker.  This provided short-term pain relief however the pain returned after a day or two.  Hydrocodone 5 mg has been helping her pain and makes it more tolerable.  She is not having any side effects of this medication.  She does report that hydrocodone does not last long enough and this leaves her in significant pain throughout the day.     Interval history 10/10/22 Ana Santos is here for follow-up regarding her chronic pain.  Pill counts appear to be consistent with her hydrocodone.  She is trying only uses medication when the pain is very severe.  No side effects with the hydrocodone reported.  The hydrocodone is helping her lower back pain remains tolerable.  She is also had some discomfort around her right inguinal area/anterior hip.  She says this started after doing some work in  the yard.  She has occasional use heating pads for her lower back and found this to be helpful.  She has not tried TENS before.     Interval History 12/07/22 Ana Santos is here with her husband for follow-up regarding her chronic pain.  She continues to have severe lower back and thoracic back pain.  Back pain will radiate into her thighs.  She denies any recent changes in strength or sensation.   Hydrocodone 5 mg continues to help keep her pain controlled, she usually takes 1 in the morning, and will take another tablet later in the day if needed.  She has not had any recent falls.  No side effects with her medications.  Ana Santos reports she is able to be more active when using the hydrocodone and is able to get done more around her house.       Interval History 01/25/23 Ana Santos is here with her husband following up regarding treatment for her chronic pain.  She continues to have lower back pain with some pain in her mid back and thighs right greater than left.  She also has pain shooting down her right leg.  Ana Santos reports that hydrocodone is helping keep her pain more tolerable.  She is not having any side effects with hydrocodone.  She tried physical therapy but says it made her pain worse.  She has not tried gabapentin, she says she did not know this was available.  Interval History 03/29/23 Ana Santos is here to follow-up on her chronic pain.  Ana Santos reports she had about 80% improvement with bilateral lumbar L2, L3, L4 medial branch blocks with L5 dorsal ramus injection under for scopic guidance.  She also had a hip injection by orthopedics under ultrasound guidance with improvement in her right hip pain for couple weeks.  Hip pain has been returning.  She continues to use hydrocodone when her pain is severe, no side effects with this medication reported.  Hydrocodone allows her to be more active and get out of the house more.  She reports she has been treated for bladder cancer.   Pain Inventory Average Pain 10 Pain Right Now 7 My pain is constant, sharp, stabbing, and aching  In the last 24 hours, has pain interfered with the following? General activity 7 Relation with others 7 Enjoyment of life 6 What TIME of day is your pain at its worst? morning  and daytime Sleep (in general) Poor  Pain is worse with: walking, sitting, and some activites Pain improves with: heat/ice,  medication, and injections Relief from Meds: 8  Family History  Problem Relation Age of Onset   Heart disease Mother    Cancer Mother        Lung   Heart attack Mother    Diabetes Mother    Cancer Father        Lung   Diabetes Sister    Cancer Sister        unkown type   Diabetes Brother    Lung cancer Brother        Lung   AAA (abdominal aortic aneurysm) Neg Hx    Colon cancer Neg Hx    Stomach cancer Neg Hx    Esophageal cancer Neg Hx    Liver cancer Neg Hx    Pancreatic cancer Neg Hx    Rectal cancer Neg Hx    Social History   Socioeconomic History   Marital status: Married    Spouse name: Windy Fast   Number of  children: 5   Years of education: Not on file   Highest education level: Not on file  Occupational History   Occupation: alterations   Occupation: Retired    Comment: Neurosurgeon  Tobacco Use   Smoking status: Every Day    Current packs/day: 0.25    Average packs/day: 0.3 packs/day for 50.0 years (12.5 ttl pk-yrs)    Types: Cigarettes   Smokeless tobacco: Never   Tobacco comments:    4-5 cigs daily  is aware she needs to quit  Vaping Use   Vaping status: Never Used  Substance and Sexual Activity   Alcohol use: Never   Drug use: Never   Sexual activity: Not Currently  Other Topics Concern   Not on file  Social History Narrative   Pt is married, 5 sons (4 living), 14 grandchildren, 4 great grandchildren.   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   Social Determinants of Corporate investment banker Strain: Low Risk  (07/31/2022)   Overall Financial Resource Strain (CARDIA)    Difficulty of Paying Living Expenses: Not hard at all  Food Insecurity: No Food Insecurity (07/31/2022)   Hunger Vital Sign    Worried About Running Out of Food in the Last Year: Never true    Ran Out of Food in the Last Year: Never true  Transportation Needs: No Transportation Needs (07/31/2022)   PRAPARE - Scientist, research (physical sciences) (Medical): No    Lack of Transportation (Non-Medical): No  Physical Activity: Inactive (07/31/2022)   Exercise Vital Sign    Days of Exercise per Week: 0 days    Minutes of Exercise per Session: 0 min  Stress: No Stress Concern Present (07/31/2022)   [employer] of Occupational Health - Occupational Stress Questionnaire    Feeling of Stress : Not at all  Social Connections: Moderately Isolated (01/29/2020)   Social Connection and Isolation Panel [NHANES]    Frequency of Communication with Friends and Family: More than three times a week    Frequency of Social Gatherings with Friends and Family: Once a week    Attends Religious Services: Never    Database administrator or Organizations: No    Attends Banker Meetings: Never    Marital Status: Married   Past Surgical History:  Procedure Laterality Date   ANEURYSM COILING  04/2009   Dr. Corliss Skains   APPENDECTOMY     BACK SURGERY  02/28/2021   BACK SURGERY     08/2021   BREAST LUMPECTOMY Right 1986   benign   CYSTOSCOPY W/ RETROGRADES Bilateral 01/19/2023   Procedure: BILATERAL RETROGRADE PYELOGRAM;  Surgeon: Jerilee Field, MD;  Location: [facility];  Service: Urology;  Laterality: Bilateral;   CYSTOSCOPY WITH BIOPSY N/A 01/19/2023   Procedure: CYSTOSCOPY WITH BLADDER BIOPSY POST OP GEMCITABINE;  Surgeon: Jerilee Field, MD;  Location: [facility];  Service: Urology;  Laterality: N/A;   CYSTOSCOPY WITH FULGERATION N/A 01/19/2023   Procedure: CYSTOSCOPY WITH FULGERATION;  Surgeon: Jerilee Field, MD;  Location: [facility];  Service: Urology;  Laterality: N/A;  75 MINS FOR CASE   DIAGNOSTIC LAPAROSCOPY     Proable laparoscopic right colectomy 03-14-18 Dr. Johna Sheriff   EYE SURGERY Bilateral    ioc lens for cataracts   LAPAROSCOPIC RIGHT COLECTOMY Right 03/14/2018   Procedure: LAPAROSCOPIC ASSISTED  RIGHT COLECTOMY;  Surgeon: Glenna Fellows, MD;  Location: [facility];  Service: General;  Laterality: Right;    LAPAROSCOPY N/A  03/14/2018   Procedure: LAPAROSCOPY;  Surgeon: Glenna Fellows, MD;  Location: [facility];  Service: General;  Laterality: N/A;   LEFT HEART CATHETERIZATION WITH CORONARY ANGIOGRAM N/A 08/05/2014   Procedure: LEFT HEART CATHETERIZATION WITH CORONARY ANGIOGRAM;  Surgeon: Iran Ouch, MD;  Location: [facility];  Service: Cardiovascular;  Laterality: N/A;   LUMBAR LAMINECTOMY N/A 02/28/2021   Procedure: LEFT L1-L2 MICRODISCECTOMY AND FASCIECTOMY;  Surgeon: Eldred Manges, MD;  Location: [facility];  Service: Orthopedics;  Laterality: N/A;   TONSILLECTOMY     TOTAL ABDOMINAL HYSTERECTOMY  1970   complete   TRANSLABYRINTHINE PROCEDURE  2002   [facility] Dr. Erroll Luna tumor removal   TRANSURETHRAL RESECTION OF BLADDER TUMOR N/A 02/08/2018   Procedure: TRANSURETHRAL RESECTION OF BLADDER TUMOR (TURBT)/ POSTOPERATIVE INSTILLATION OF CHEMO THERAPY;  Surgeon: Jerilee Field, MD;  Location: [facility];  Service: Urology;  Laterality: N/A;   Past Surgical History:  Procedure Laterality Date   ANEURYSM COILING  04/2009   Dr. Corliss Skains   APPENDECTOMY     BACK SURGERY  02/28/2021   BACK SURGERY     08/2021   BREAST LUMPECTOMY Right 1986   benign   CYSTOSCOPY W/ RETROGRADES Bilateral 01/19/2023   Procedure: BILATERAL RETROGRADE PYELOGRAM;  Surgeon: Jerilee Field, MD;  Location: [facility];  Service: Urology;  Laterality: Bilateral;   CYSTOSCOPY WITH BIOPSY N/A 01/19/2023   Procedure: CYSTOSCOPY WITH BLADDER BIOPSY POST OP GEMCITABINE;  Surgeon: Jerilee Field, MD;  Location: [facility];  Service: Urology;  Laterality: N/A;   CYSTOSCOPY WITH FULGERATION N/A 01/19/2023   Procedure: CYSTOSCOPY WITH FULGERATION;  Surgeon: Jerilee Field, MD;  Location: [facility];  Service: Urology;  Laterality: N/A;  75 MINS FOR CASE   DIAGNOSTIC LAPAROSCOPY     Proable laparoscopic right colectomy 03-14-18 Dr. Johna Sheriff   EYE SURGERY Bilateral    ioc lens for cataracts   LAPAROSCOPIC RIGHT COLECTOMY Right  03/14/2018   Procedure: LAPAROSCOPIC ASSISTED  RIGHT COLECTOMY;  Surgeon: Glenna Fellows, MD;  Location: [facility];  Service: General;  Laterality: Right;   LAPAROSCOPY N/A 03/14/2018   Procedure: LAPAROSCOPY;  Surgeon: Glenna Fellows, MD;  Location: [facility];  Service: General;  Laterality: N/A;   LEFT HEART CATHETERIZATION WITH CORONARY ANGIOGRAM N/A 08/05/2014   Procedure: LEFT HEART CATHETERIZATION WITH CORONARY ANGIOGRAM;  Surgeon: Iran Ouch, MD;  Location: [facility];  Service: Cardiovascular;  Laterality: N/A;   LUMBAR LAMINECTOMY N/A 02/28/2021   Procedure: LEFT L1-L2 MICRODISCECTOMY AND FASCIECTOMY;  Surgeon: Eldred Manges, MD;  Location: [facility];  Service: Orthopedics;  Laterality: N/A;   TONSILLECTOMY     TOTAL ABDOMINAL HYSTERECTOMY  1970   complete   TRANSLABYRINTHINE PROCEDURE  2002   [facility] Dr. 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  Opioid Risk Score:  Fall Risk Score:  `1  Depression screen [facility] 2/9     01/25/2023    9:28 AM  12/08/2022    7:55 AM 12/07/2022   11:05 AM 10/10/2022    9:19 AM 09/07/2022    8:18 AM 08/29/2022    9:58 AM 07/31/2022    2:07 PM  Depression screen PHQ 2/9  Decreased Interest 1 3 0 1 3 0 0  Down, Depressed, Hopeless 1 3 0 1 2 0 0  PHQ - 2 Score 2 6 0 2 5 0 0  Altered sleeping  3   1    Tired, decreased energy  3   3    Change in appetite  0   3    Feeling bad or failure about yourself   3   3    Trouble concentrating  0   3    Moving slowly or fidgety/restless  0   0    Suicidal thoughts  0   0    PHQ-9 Score  15   18    Difficult doing work/chores  Very difficult   Extremely dIfficult      Review of Systems  Musculoskeletal:  Positive for back pain.       Pain in right upper leg, right groin   All other systems reviewed and are negative.      Objective:   Physical Exam    Physical Exam Gen: no distress, normal appearing HEENT: oral mucosa pink and moist, NCAT Cardio: Reg rate Chest: normal effort, normal rate of breathing Abd: soft, non-distended Ext: no edema Psych: pleasant, appropriate  Skin: intact Neuro: Alert and awake, follows commands, cranial nerves II through XII grossly intact, speech and language normal Sensation to light touch in all 4 extremities Moving bilateral lower extremities to gravity and resistance No ankle clonus Musculoskeletal: Slump test negative Mild lumbar and thoracic paraspinal tenderness Facet loading positive  Pain with right hip flexion Mild pain at right greater trochanter     MRI T spine 05/01/22 FINDINGS: Alignment:  Physiologic.   Vertebrae: No acute fracture, evidence of discitis, or aggressive bone lesion. T7 vertebral body hemangioma noted.   Cord:  Normal signal and morphology.   Paraspinal and other soft tissues: No acute paraspinal abnormality.   Disc levels:   Disc spaces: Degenerative disease with disc height loss at T10-11 and T11-12. Degenerative disease with disc height loss at T1-2. Partially  visualized on the scout images is degenerative disease with disc height loss at C3-4, C4-5 and C5-6.   T1-T2: Mild broad-based disc bulge. Severe right and moderate left foraminal stenosis. No spinal stenosis.   T2-T3: No disc protrusion, foraminal stenosis or central canal stenosis.   T3-T4: No disc protrusion, foraminal stenosis or central canal stenosis.   T4-T5: No disc protrusion, foraminal stenosis or central canal stenosis.   T5-T6: No disc protrusion, foraminal stenosis or central canal stenosis.   T6-T7: No disc protrusion, foraminal stenosis or central canal stenosis.   T7-T8: No disc protrusion, foraminal stenosis or central canal stenosis.   T8-T9:  No disc protrusion, foraminal stenosis or central canal stenosis.   T9-T10: No disc protrusion, foraminal stenosis or central canal stenosis.   T10-T11: Mild broad-based disc bulge. No foraminal or central canal stenosis. Right perineural cyst noted.   T11-T12: No disc protrusion, foraminal stenosis or central canal stenosis.   IMPRESSION: 1. At T1-2 there is a mild broad-based disc bulge. Severe right and moderate left foraminal stenosis. 2. No acute osseous injury of the thoracic spine.   L spine MRI 12/10/21 FINDINGS: Segmentation: Standard. Lowest well-formed disc space labeled the L5-S1 level.   Alignment: Up to 4 mm anterolisthesis of L5 on S1, stable. Trace anterolisthesis of L3 on L4, also relatively stable. Straightening of the normal lumbar lordosis elsewhere. Underlying sigmoid scoliosis.   Vertebrae: Susceptibility artifact from interval PLIF at L1-2. Vertebral body height maintained without acute or chronic fracture. Bone marrow signal intensity within normal limits. Few small benign hemangiomata noted. No worrisome osseous lesions. Mild reactive marrow edema noted about the partially visualized T10-11 interspace. Reactive edema present about the right L5-S1 facet due to facet arthritis.    Conus medullaris and cauda equina: Conus extends to the L1-2 level. Conus and cauda equina appear normal.   Paraspinal and other soft tissues: Postoperative changes within the posterior paraspinous soft tissues without adverse features. Few scattered cysts noted about the partially visualized kidneys, similar to prior, likely benign. No follow-up imaging recommended.   Disc levels:   T11-12: Seen only on sagittal projection. Mild disc bulge with reactive endplate spurring. Left worse than right facet hypertrophy. No significant spinal stenosis. Foramina appear patent.   T12-L1: Chronic endplate Schmorl's node deformities without significant disc bulge. Mild right-sided facet hypertrophy. No significant stenosis.   L1-2: Interval PLIF. No residual or recurrent spinal stenosis. Foramina appear patent.   L2-3: Disc bulge with disc desiccation and intervertebral disc space narrowing. Disc bulging eccentric to the left. Reactive endplate spurring. Moderate left with mild right facet arthrosis. Resultant mild narrowing of the left lateral recess. Mild left L2 foraminal narrowing. Right neural foramen remains patent. Appearance is stable.   L3-4: Degenerative intervertebral disc space narrowing with diffuse disc bulge and disc desiccation. Disc bulging eccentric to the left with left-sided reactive endplate spurring. Resultant extraforaminal disc osteophyte contacts the exiting left L3 nerve root as it courses of the left neural foramen (series 101, image 20). Moderate left with mild right facet arthrosis. Resultant mild narrowing of the left lateral recess. Mild left L3 foraminal stenosis. Right neural foramen remains patent. Appearance is stable.   L4-5: Disc bulge with disc desiccation. Reactive endplate spurring. Mild to moderate facet hypertrophy. No more than mild narrowing of the lateral recesses bilaterally. Mild to moderate right with mild left L4 foraminal stenosis.  Appearance is stable.   L5-S1: Anterolisthesis. Disc desiccation with broad posterior disc bulge. Severe right-sided facet arthrosis with reactive marrow edema. Moderate left-sided foraminal narrowing. No significant spinal stenosis. Mild right L5 foraminal stenosis. Appearance is similar.   IMPRESSION: 1. Interval PLIF at L1-2 without residual or recurrent stenosis. 2. Multilevel degenerative spondylosis elsewhere throughout the lumbar spine with resultant mild left lateral recess and foraminal stenosis at L2-3 and L3-4, and mild to moderate bilateral foraminal narrowing at L4-5 and L5-S1 as above. 3. Advanced right-sided facet arthrosis at L5-S1 with associated reactive marrow edema, which could serve as a source for lower back pain.  T-spine         Assessment & Plan:   Chronic lumbar and thoracic back pain  and failed back syndrome, SI joint pain -Hx of  L1-L2 TLIF -UDS and pain agreement completed prior visit -Continue hydrocodone at 5 mg dose Q4h PRN, no more than 3 doses a day #90 with each refill- call when low  -Pt seen by Dr. Wynn Banker for left SI joint injection- helped for few days.  Orthopedics has noted some pain in her right SI joint -Will place consult to Dr. Wynn Banker for medial branch blocks bilateral L3-L4, L4-L5, L5-S1 she reports 80% improvement after procedure, is scheduled to follow-up with Dr. Wynn Banker for additional procedure -Pt has allergy noted to hydrocodone in chart-she insists this is not correct and she does not have this allergy -Zynex TENS and Heat/Cold therapy device ordered prior visit -Will reorder gabapentin -dose limited by sedation -Physical therapy-Ana Santos reports this did not provide benefit -Continue to monitor pill count, PDMP, UDS random   R anterior hip pain -Continue Voltaren gel, continue medications as above -Ana Santos reported short-term improvement after hip injection by orthopedics   THC use history -Ana Santos reports she is not  currently using -Continue random UDS   HTN -Advised follow-up with PCP

## 2023-04-04 DIAGNOSIS — Z5111 Encounter for antineoplastic chemotherapy: Secondary | ICD-10-CM | POA: Diagnosis not present

## 2023-04-04 DIAGNOSIS — C672 Malignant neoplasm of lateral wall of bladder: Secondary | ICD-10-CM | POA: Diagnosis not present

## 2023-04-13 ENCOUNTER — Ambulatory Visit (INDEPENDENT_AMBULATORY_CARE_PROVIDER_SITE_OTHER): Payer: Medicare Other | Admitting: Orthopedic Surgery

## 2023-04-13 ENCOUNTER — Other Ambulatory Visit (INDEPENDENT_AMBULATORY_CARE_PROVIDER_SITE_OTHER): Payer: Self-pay

## 2023-04-13 DIAGNOSIS — M25551 Pain in right hip: Secondary | ICD-10-CM

## 2023-04-13 NOTE — Progress Notes (Addendum)
Orthopedic Spine Surgery Office Note 2   Assessment: Patient is a 82 y.o. female status post L1/2 TLIF and PSIF for recurrent disc herniation.  Having significant, disabling low back pain and now having acute or right posterior hip pain     Plan: -Patient has tried physical therapy, home exercises, NSAIDs, Tylenol, steroid injections, patches, creams, operative intervention, pain management, medrol dosepak, right hip injection -Continue with pain management -Patient got 100% relief of her hip pain with an injection which I explained to her is of diagnostic value but I do not think repeating injections every 2 weeks is a good option for her -She has now tried over six weeks of conservative treatment without any relief so ordered an MRI of the right hip to evaluate further. Will her refer to one of my arthroplasty partners to see if there is a surgical option for her since she has exhausted conservative treatments  -Patient should return to office on an as needed basis     Patient expressed understanding of the plan and all questions were answered to the patient's satisfaction.    ___________________________________________________________________________   History: Patient is a 82 y.o. female who has been previously seen in the office who has had chronic low back pain and is more recently seen for acute onset of right hip pain. After our last visit, she got a diagnostic/therapeutic right hip injection. She said she got significant relief for about 2 weeks.  She said she got 100% relief with that injection.  She was very grateful for those 2 weeks but then the pain returned and it was just as significant as it was prior to the injection.  She feels the pain especially in the morning but also with any activity that requires her to rotate the hip.  She does not have any pain radiating down her leg.  She feels the pain in the groin area.     Physical Exam:   General: no acute distress, appears  stated age Neurologic: alert, answering questions appropriately, following commands Respiratory: unlabored breathing on room air, symmetric chest rise Psychiatric: appropriate affect, normal cadence to speech     MSK (spine):   -Strength exam                                                   Left                  Right EHL                              4/5                  4/5 TA                                 4/5                  5/5 GSC                             4/5                  5/5 Knee extension  5/5                  5/5 Hip flexion                    5/5                  4/5   -Sensory exam                           Sensation intact to light touch in L3-S1 nerve distributions of bilateral lower extremities   -Achilles DTR: 1/4 on the left, 1/4 on the right -Patellar tendon DTR: 1/4 on the left, 1/4 on the right   -Straight leg raise: Negative bilaterally -Contralateral straight leg raise: Negative bilaterally -Clonus: no beats bilaterally   -Right hip exam: Pain with internal and external rotation at the hip, positive FADIR, positive Stinchfield   Imaging: XRs of the lumbar spine from 03/02/2023 were previously independently reviewed and interpreted, showing L1/2 to interbody device with posterior instrumentation. No lucency seen around the screws or interbody device. Interbody and screws in similar position to prior films in 08/2022.  No fracture or dislocation seen.  No evidence of instability on flexion/extension views.    XRs of the right hip from 04/13/2023 were independently reviewed and interpreted, showing minimal joint space narrowing. No other significant degenerative changes. No fracture or dislocation seen.      Patient name: Ana Santos Patient MRN: [id number] Date of visit: 04/13/23

## 2023-04-17 ENCOUNTER — Telehealth: Payer: Self-pay

## 2023-04-17 ENCOUNTER — Encounter: Payer: Medicare Other | Attending: Physical Medicine & Rehabilitation | Admitting: Physical Medicine & Rehabilitation

## 2023-04-17 ENCOUNTER — Encounter: Payer: Self-pay | Admitting: Physical Medicine & Rehabilitation

## 2023-04-17 VITALS — BP 156/77 | HR 67 | Ht 62.0 in | Wt 130.4 lb

## 2023-04-17 DIAGNOSIS — M47817 Spondylosis without myelopathy or radiculopathy, lumbosacral region: Secondary | ICD-10-CM | POA: Insufficient documentation

## 2023-04-17 MED ORDER — LIDOCAINE HCL (PF) 2 % IJ SOLN
4.0000 mL | Freq: Once | INTRAMUSCULAR | Status: AC
Start: 2023-04-17 — End: 2023-04-17
  Administered 2023-04-17: 4 mL

## 2023-04-17 MED ORDER — IOHEXOL 180 MG/ML  SOLN
2.0000 mL | Freq: Once | INTRAMUSCULAR | Status: AC
  Administered 2023-04-17: 2 mL

## 2023-04-17 MED ORDER — LIDOCAINE HCL 1 % IJ SOLN
10.0000 mL | Freq: Once | INTRAMUSCULAR | Status: AC
Start: 2023-04-17 — End: 2023-04-17
  Administered 2023-04-17: 10 mL

## 2023-04-17 NOTE — Telephone Encounter (Signed)
Patient called to let Dr. Wynn Banker know that her pain level is a 1 now. Came in for MBB today and pain level was a 7 before injection and now down to a 1.

## 2023-04-17 NOTE — Patient Instructions (Signed)

## 2023-04-17 NOTE — Progress Notes (Signed)
  PROCEDURE RECORD [facility]   Name: Ana Santos DOB:11-14-1940 MRN: [id number]  Date:04/17/2023  Physician: Claudette Laws, MD    Nurse/CMA: Prapti Grussing RN  Allergies:  Allergies  Allergen Reactions   Prednisone Other (See Comments)    Hallucinations   Atorvastatin Other (See Comments)     Lipitor caused arm pain (3/09)   Hydrocodone-Acetaminophen Other (See Comments)    hallucinations   Morphine Other (See Comments)    hallucinations   Nortriptyline Nausea And Vomiting    Caused nausea and vomiting and shaking, kept her up all night   Topamax [Topiramate] Nausea And Vomiting    *dizziness*   Tramadol Other (See Comments)    withdrawal    Consent Signed: Yes.    Is patient diabetic? No.  CBG today?   Pregnant: No. LMP: No LMP recorded. Patient has had a hysterectomy. (age 1-55)  Anticoagulants: yes (plavix) Anti-inflammatory: no Antibiotics: no  Procedure: bilateral L2-3-4-5 MBB  Position: Prone Start Time: 10:59  End Time: 11:14 Fluoro Time: 28min18 sec  RN/CMA Haematologist RN    Time 10:37 11:21    BP 156/77 150/78    Pulse 67 81    Respirations 16 16    O2 Sat 99 98    S/S 6 6    Pain Level 7/10 4/10     D/C home with husband Windy Fast, [name] & O X 3, D/C instructions reviewed, and sits independently.

## 2023-04-17 NOTE — Progress Notes (Signed)
Bilateral Lumbar L2, L3, L4  medial branch blocks and L 5 dorsal ramus injection under fluoroscopic guidance  Indication: Lumbar pain which is not relieved by medication management or other conservative care and interfering with self-care and mobility.No iohexol allergy   Informed consent was obtained after describing risks and benefits of the procedure with the patient, this includes bleeding, infection, paralysis and medication side effects.  The patient wishes to proceed and has given written consent.  The patient was placed in prone position.  The lumbar area was marked and prepped with Betadine.  One mL of 1% lidocaine was injected into each of 6 areas into the skin and subcutaneous tissue.  Then a 22-gauge 3.5in spinal needle was inserted targeting the junction of the left S1 superior articular process and sacral ala junction. Needle was advanced under fluoroscopic guidance.  Bone contact was made.Omnipaque 180 was injected x 0.5 mL demonstrating no intravascular uptake.  Then a solution  of 2% MPF lidocaine was injected x 0.5 mL.  Then the left L5 superior articular process in transverse process junction was targeted.  Bone contact was made. Omnipaque 180 was injected x 0.5 mL demonstrating no intravascular uptake. Then a solution containing  2% MPF lidocaine was injected x 0.5 mL.  Then the left L4 superior articular process in transverse process junction was targeted.  Bone contact was made. Omnipaque 180 was injected x 0.5 mL demonstrating no intravascular uptake.  Then a solution containing2% MPF lidocaine was injected x 0.5 mL.  Then the left L3 superior articular process in transverse process junction was targeted.  Bone contact was made. Omnipaque 180 was injected x 0.5 mL demonstrating no intravascular uptake.  Then a solution containing2% MPF lidocaine was injected x 0.5 mL.This same procedure was performed on the right side using the same needle, technique and injectate.  Patient tolerated  procedure well.  Post procedure instructions were given.  Preinjection pain 7/10 Post injection pain 4/10  Pt instructed to call in 1-2 hours once she has been up to see if pain scores change

## 2023-04-18 DIAGNOSIS — Z8551 Personal history of malignant neoplasm of bladder: Secondary | ICD-10-CM | POA: Diagnosis not present

## 2023-05-01 ENCOUNTER — Other Ambulatory Visit: Payer: Self-pay

## 2023-05-01 MED ORDER — HYDROCODONE-ACETAMINOPHEN 5-325 MG PO TABS: 1.0000 | ORAL_TABLET | Freq: Three times a day (TID) | ORAL | 0 refills | Status: DC | PRN

## 2023-05-01 NOTE — Telephone Encounter (Addendum)
Please send to [facility] in [place]  PMP Report:  Filled  Written  ID  Drug  QTY  Days  Prescriber  RX #  Dispenser  Refill  Daily Dose*  Pymt Type  PMP  03/23/2023 03/23/2023 1  Hydrocodone-Acetamin 5-325 Mg 90.00 30 Yu Sht [id number] Nor ([id number]) 0/0 15.00 MME Medicaid [place] 02/16/2023 02/16/2023 1  Hydrocodone-Acetamin 10-325 Mg 45.00 30 Yu Sht [id number] Nor ([id number]) 0/0 15.00 MME Medicaid [place]

## 2023-05-05 ENCOUNTER — Ambulatory Visit
Admission: RE | Admit: 2023-05-05 | Discharge: 2023-05-05 | Disposition: A | Discharge status: Home / Self Care | Payer: Medicare Other | Source: Ambulatory Visit | Attending: Orthopedic Surgery | Admitting: Orthopedic Surgery

## 2023-05-05 DIAGNOSIS — M25551 Pain in right hip: Secondary | ICD-10-CM | POA: Diagnosis not present

## 2023-05-05 DIAGNOSIS — M160 Bilateral primary osteoarthritis of hip: Secondary | ICD-10-CM | POA: Diagnosis not present

## 2023-05-05 DIAGNOSIS — G8929 Other chronic pain: Secondary | ICD-10-CM | POA: Diagnosis not present

## 2023-05-08 ENCOUNTER — Encounter: Payer: Self-pay | Admitting: Orthopaedic Surgery

## 2023-05-08 ENCOUNTER — Ambulatory Visit: Payer: Medicare Other | Admitting: Orthopaedic Surgery

## 2023-05-08 DIAGNOSIS — M1611 Unilateral primary osteoarthritis, right hip: Secondary | ICD-10-CM

## 2023-05-08 NOTE — Progress Notes (Signed)
Office Visit Note   Patient: Ana Santos           Date of Birth: [birth year]           MRN: [id number] Visit Date: 05/08/2023              Requested by: London Sheer, MD [address] [place],  [place] [address] PCP: Loyola Mast, MD   Assessment & Plan: Visit Diagnoses:  1. Primary osteoarthritis of right hip     Plan: Lorette is a 82 year old female with end-stage right hip DJD.  MRI personally reviewed and interpreted shows advanced DJD with grade 4 changes and femoral head spurring.  Based on these findings I have recommended a total hip replacement.  However if she feels that her symptoms are manageable she will continue to do so.  Handout for total hip replacement provided today.  She will think about her options.  Follow-Up Instructions: No follow-ups on file.   Orders:  No orders of the defined types were placed in this encounter.  No orders of the defined types were placed in this encounter.     Procedures: No procedures performed   Clinical Data: No additional findings.   Subjective: Chief Complaint  Patient presents with   Right Hip - Pain    HPI Patient is a very pleasant 82 year old female referral from Dr. Christell Constant for chronic pain.  She has groin pain and cannot lift her leg for ADLs.  She had a cortisone injection by Dr. Shon Baton on October 1 of this year which helped just for a little bit.  She cannot take NSAIDs due to being on Plavix.  Takes Tylenol occasionally.  Review of Systems  Constitutional: Negative.   HENT: Negative.    Eyes: Negative.   Respiratory: Negative.    Cardiovascular: Negative.   Endocrine: Negative.   Musculoskeletal: Negative.   Neurological: Negative.   Hematological: Negative.   Psychiatric/Behavioral: Negative.    All other systems reviewed and are negative.    Objective: Vital Signs: There were no vitals taken for this visit.  Physical Exam Vitals and nursing note reviewed.  Constitutional:       Appearance: She is well-developed.  HENT:     Head: Atraumatic.     Nose: Nose normal.  Eyes:     Extraocular Movements: Extraocular movements intact.  Cardiovascular:     Pulses: Normal pulses.  Pulmonary:     Effort: Pulmonary effort is normal.  Abdominal:     Palpations: Abdomen is soft.  Musculoskeletal:     Cervical back: Neck supple.  Skin:    General: Skin is warm.     Capillary Refill: Capillary refill takes less than 2 seconds.  Neurological:     Mental Status: She is alert. Mental status is at baseline.  Psychiatric:        Behavior: Behavior normal.        Thought Content: Thought content normal.        Judgment: Judgment normal.     Ortho Exam Exam of the right hip shows significant pain with movement of the hip joint.  Antalgic gait. Specialty Comments:  No specialty comments available.  Imaging: No results found.   PMFS History: Patient Active Problem List   Diagnosis Date Noted   Bladder cancer (HCC) 03/12/2023   Other secondary scoliosis, lumbar region 08/23/2021    Class: Chronic   Fusion of spine of lumbar region 08/23/2021   Lumbar post-laminectomy syndrome 06/15/2021  Lumbosacral spondylosis without myelopathy 02/28/2021   Foraminal stenosis due to intervertebral disc disease 01/21/2021   History of bladder cancer 01/21/2021   Labyrinthitis 12/08/2019   Left-sided low back pain without sciatica 05/05/2019   Visual loss, transient, bilateral 02/21/2019   Essential hypertension 02/21/2019   Colonic mass s/p lap ileocectomy 03/14/2018 03/14/2018   Acute lower GI bleeding 03/14/2018   Chronic anticoagulation 03/14/2018   Chronic tension-type headache, not intractable 05/31/2015   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) 03/11/2014   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   RLS (restless legs syndrome) 07/04/2012   History of cerebral aneurysm 03/26/2009   Vitamin D deficiency 08/10/2008   Venous  insufficiency 08/10/2008   History of acoustic neuroma 08/18/2007   Hypothyroidism 08/18/2007   Tobacco use disorder, continuous 08/18/2007   Mixed hyperlipidemia 07/18/2007   Anxiety with depression 07/18/2007   COPD (chronic obstructive pulmonary disease) with chronic bronchitis (HCC) 07/18/2007   Fibromyalgia 07/18/2007   Past Medical History:  Diagnosis Date   AN (acoustic neuroma) (HCC)    deafness in R ear   Aneurysm (HCC)    basilar tip aneursym s/p stent assisted coiling 04/26/09   Anxiety    Arthritis    Bladder cancer (HCC)    Cataract    Cigarette smoker    COPD (chronic obstructive pulmonary disease) (HCC)    mild, no inhalers or oxygen used   Depression    History of kidney stones    Hyperlipidemia    Hypertension    Hypothyroidism    IBS (irritable bowel syndrome)    Mitral valve prolapse    mild takes beta blocker   Osteopenia    Stroke (HCC)    x 3 ministrokes last one feb 2014   Venous insufficiency    Vitamin D deficiency     Family History  Problem Relation Age of Onset   Heart disease Mother    Cancer Mother        Lung   Heart attack Mother    Diabetes Mother    Cancer Father        Lung   Diabetes Sister    Cancer Sister        unkown type   Diabetes Brother    Lung cancer Brother        Lung   AAA (abdominal aortic aneurysm) Neg Hx    Colon cancer Neg Hx    Stomach cancer Neg Hx    Esophageal cancer Neg Hx    Liver cancer Neg Hx    Pancreatic cancer Neg Hx    Rectal cancer Neg Hx     Past Surgical History:  Procedure Laterality Date   ANEURYSM COILING  04/2009   Dr. Corliss Skains   APPENDECTOMY     BACK SURGERY  02/28/2021   BACK SURGERY     08/2021   BREAST LUMPECTOMY Right 1986   benign   CYSTOSCOPY W/ RETROGRADES Bilateral 01/19/2023   Procedure: BILATERAL RETROGRADE PYELOGRAM;  Surgeon: Jerilee Field, MD;  Location: [facility];  Service: Urology;  Laterality: Bilateral;   CYSTOSCOPY WITH BIOPSY N/A 01/19/2023   Procedure:  CYSTOSCOPY WITH BLADDER BIOPSY POST OP GEMCITABINE;  Surgeon: Jerilee Field, MD;  Location: [facility];  Service: Urology;  Laterality: N/A;   CYSTOSCOPY WITH FULGERATION N/A 01/19/2023   Procedure: CYSTOSCOPY WITH FULGERATION;  Surgeon: Jerilee Field, MD;  Location: [facility];  Service: Urology;  Laterality: N/A;  75 MINS FOR CASE   DIAGNOSTIC  LAPAROSCOPY     Proable laparoscopic right colectomy 03-14-18 Dr. Johna Sheriff   EYE SURGERY Bilateral    ioc lens for cataracts   LAPAROSCOPIC RIGHT COLECTOMY Right 03/14/2018   Procedure: LAPAROSCOPIC ASSISTED  RIGHT COLECTOMY;  Surgeon: Glenna Fellows, MD;  Location: [facility];  Service: General;  Laterality: Right;   LAPAROSCOPY N/A 03/14/2018   Procedure: LAPAROSCOPY;  Surgeon: Glenna Fellows, MD;  Location: [facility];  Service: General;  Laterality: N/A;   LEFT HEART CATHETERIZATION WITH CORONARY ANGIOGRAM N/A 08/05/2014   Procedure: LEFT HEART CATHETERIZATION WITH CORONARY ANGIOGRAM;  Surgeon: Iran Ouch, MD;  Location: [facility];  Service: Cardiovascular;  Laterality: N/A;   LUMBAR LAMINECTOMY N/A 02/28/2021   Procedure: LEFT L1-L2 MICRODISCECTOMY AND FASCIECTOMY;  Surgeon: Eldred Manges, MD;  Location: [facility];  Service: Orthopedics;  Laterality: N/A;   TONSILLECTOMY     TOTAL ABDOMINAL HYSTERECTOMY  1970   complete   TRANSLABYRINTHINE PROCEDURE  2002   [facility] Dr. Erroll Luna tumor removal   TRANSURETHRAL RESECTION OF BLADDER TUMOR N/A 02/08/2018   Procedure: TRANSURETHRAL RESECTION OF BLADDER TUMOR (TURBT)/ POSTOPERATIVE INSTILLATION OF CHEMO THERAPY;  Surgeon: Jerilee Field, MD;  Location: [facility];  Service: Urology;  Laterality: N/A;   Social History   Occupational History   Occupation: alterations   Occupation: Retired    Comment: Neurosurgeon  Tobacco Use   Smoking status: Every Day    Current packs/day: 0.25    Average packs/day: 0.3 packs/day for 50.0 years (12.5 ttl pk-yrs)    Types: Cigarettes   Smokeless  tobacco: Never   Tobacco comments:    4-5 cigs daily  is aware she needs to quit  Vaping Use   Vaping status: Never Used  Substance and Sexual Activity   Alcohol use: Never   Drug use: Never   Sexual activity: Not Currently

## 2023-05-24 ENCOUNTER — Encounter: Payer: Self-pay | Admitting: Physical Medicine & Rehabilitation

## 2023-05-24 ENCOUNTER — Encounter: Payer: Medicare Other | Attending: Physical Medicine & Rehabilitation | Admitting: Physical Medicine & Rehabilitation

## 2023-05-24 ENCOUNTER — Other Ambulatory Visit: Payer: Self-pay | Admitting: Physical Medicine & Rehabilitation

## 2023-05-24 VITALS — BP 164/75 | HR 67 | Ht 62.0 in | Wt 131.0 lb

## 2023-05-24 DIAGNOSIS — M961 Postlaminectomy syndrome, not elsewhere classified: Secondary | ICD-10-CM

## 2023-05-24 DIAGNOSIS — M47817 Spondylosis without myelopathy or radiculopathy, lumbosacral region: Secondary | ICD-10-CM | POA: Insufficient documentation

## 2023-05-24 DIAGNOSIS — G894 Chronic pain syndrome: Secondary | ICD-10-CM | POA: Diagnosis not present

## 2023-05-24 DIAGNOSIS — Z5181 Encounter for therapeutic drug level monitoring: Secondary | ICD-10-CM

## 2023-05-24 DIAGNOSIS — Z79891 Long term (current) use of opiate analgesic: Secondary | ICD-10-CM

## 2023-05-24 DIAGNOSIS — M25551 Pain in right hip: Secondary | ICD-10-CM

## 2023-05-24 MED ORDER — HYDROCODONE-ACETAMINOPHEN 5-325 MG PO TABS: 1.0000 | ORAL_TABLET | Freq: Three times a day (TID) | ORAL | 0 refills | Status: DC | PRN

## 2023-05-24 NOTE — Progress Notes (Signed)
Subjective:    Patient ID: Ana Santos, female    DOB: 12-16-1940, 82 y.o.   MRN: [id number]  HPI    HPI Ana Santos is a 82 y.o. year old female  who  has a past medical history of AN (acoustic neuroma) (HCC), Aneurysm (HCC), Anxiety, Bladder cancer (HCC), Cataract, Cigarette smoker, COPD (chronic obstructive pulmonary disease) (HCC), Coronary artery disease, Depression, Headache, Hyperlipidemia, Hypertension, Hypothyroidism, IBS (irritable bowel syndrome), Mitral valve prolapse, Osteopenia, Stroke (HCC), Venous insufficiency, and Vitamin D deficiency.   They are presenting to PM&R clinic as a new patient for pain management evaluation. They were referred for treatment of chronic lumbar and thoracic back pain.   She reports that she has had worsening pain for about 4 years.  No consistent pain shooting down her legs.  Pain is stabbing in quality.  Lifting anything makes it worse.  Denies any numbness or paresthesias in her limbs.  She had a prior left L1-2 hemilaminectomy and then later a L1-L2 TLIF in March 2023.  She is previously followed by Dr. Otelia Sergeant.  She is now being seen by Dr. Willia Craze orthopedic spine surgery.  She reports a history of bowel surgery with small intestine removed previously.   Red flag symptoms: Patient denies saddle anesthesia, loss of bowel or bladder continence, new weakness, new numbness/tingling, or pain waking up at nighttime.   Medications tried: Tylenol, advil, lidcaine patch, icy hot with mild benefit She is currently on Plavix which limits her medication options Hydrocodone 10 mg previously helped control her pain  tramadol-this did help her pain however she had very severe withdrawal issues when she did not get her medication on time, would like to avoid this medication if possible Oxycodone helped her pain previously   Other treatments: PT/OT  made it worse Heat pad helps 10-15 ESI injections- reports minimal benefit  L1-2 hemilaminectomy  and then later a L1-L2 TLIF in March 2023.  Without sustained improvement   Interval history 06/27/2022 Ana Santos is here for follow-up regarding her chronic pain.  She continues to have lumbar and thoracic back pain.  She says the pain has been very severe since medications have been discontinued.  She did get some tramadol at the end of December which provided mild benefit to her pain.  She denies any use of other substances or marijuana since her last visit.  She says she regrets having her back surgery done last year.   Interval history 08/29/2022 Ana Santos is here for follow-up regarding her chronic back pain primarily in her lumbar and thoracic back.  She also recently had a left SI joint injection by Dr. Wynn Banker.  This provided short-term pain relief however the pain returned after a day or two.  Hydrocodone 5 mg has been helping her pain and makes it more tolerable.  She is not having any side effects of this medication.  She does report that hydrocodone does not last long enough and this leaves her in significant pain throughout the day.     Interval history 10/10/22 Ana Santos is here for follow-up regarding her chronic pain.  Pill counts appear to be consistent with her hydrocodone.  She is trying only uses medication when the pain is very severe.  No side effects with the hydrocodone reported.  The hydrocodone is helping her lower back pain remains tolerable.  She is also had some discomfort around her right inguinal area/anterior hip.  She says this started after doing some work in  the yard.  She has occasional use heating pads for her lower back and found this to be helpful.  She has not tried TENS before.     Interval History 12/07/22 Patient is here with her husband for follow-up regarding her chronic pain.  She continues to have severe lower back and thoracic back pain.  Back pain will radiate into her thighs.  She denies any recent changes in strength or sensation.   Hydrocodone 5 mg continues to help keep her pain controlled, she usually takes 1 in the morning, and will take another tablet later in the day if needed.  She has not had any recent falls.  No side effects with her medications.  Patient reports she is able to be more active when using the hydrocodone and is able to get done more around her house.       Interval History 01/25/23 Patient is here with her husband following up regarding treatment for her chronic pain.  She continues to have lower back pain with some pain in her mid back and thighs right greater than left.  She also has pain shooting down her right leg.  Patient reports that hydrocodone is helping keep her pain more tolerable.  She is not having any side effects with hydrocodone.  She tried physical therapy but says it made her pain worse.  She has not tried gabapentin, she says she did not know this was available.  Interval History 03/29/23 Ana Santos is here to follow-up on her chronic pain.  Patient reports she had about 80% improvement with bilateral lumbar L2, L3, L4 medial branch blocks with L5 dorsal ramus injection under for scopic guidance.  She also had a hip injection by orthopedics under ultrasound guidance with improvement in her right hip pain for couple weeks.  Hip pain has been returning.  She continues to use hydrocodone when her pain is severe, no side effects with this medication reported.  Hydrocodone allows her to be more active and get out of the house more.  She reports she has been treated for bladder cancer.  Interval History 05/24/23 Patient is here for follow-up regarding her chronic back and hip pain.  Patient was seen by Dr. Roda Shutters for DJD right hip, recommend total hip replacement.  She plans to hold off for now but may consider at a later time.  She had good results with L2-3-4 5 MBB with Dr. Wynn Banker, has follow-up visit scheduled.  She reports in the meantime Norco 5 is keeping her pain at a tolerable level and  this allows her to be more active.  No significant side effects with the medication.   Pain Inventory Average Pain 10 Pain Right Now 8 My pain is constant, sharp, burning, and aching  In the last 24 hours, has pain interfered with the following? General activity 10 Relation with others 0 Enjoyment of life 10 What TIME of day is your pain at its worst? morning , daytime, evening, and night Sleep (in general) Poor  Pain is worse with: walking, sitting, and some activites Pain improves with: heat, medication Relief from Meds: 8  Family History  Problem Relation Age of Onset   Heart disease Mother    Cancer Mother        Lung   Heart attack Mother    Diabetes Mother    Cancer Father        Lung   Diabetes Sister    Cancer Sister  unkown type   Diabetes Brother    Lung cancer Brother        Lung   AAA (abdominal aortic aneurysm) Neg Hx    Colon cancer Neg Hx    Stomach cancer Neg Hx    Esophageal cancer Neg Hx    Liver cancer Neg Hx    Pancreatic cancer Neg Hx    Rectal cancer Neg Hx    Social History   Socioeconomic History   Marital status: Married    Spouse name: Windy Fast   Number of children: 5   Years of education: Not on file   Highest education level: Not on file  Occupational History   Occupation: alterations   Occupation: Retired    Comment: Neurosurgeon  Tobacco Use   Smoking status: Every Day    Current packs/day: 0.25    Average packs/day: 0.3 packs/day for 50.0 years (12.5 ttl pk-yrs)    Types: Cigarettes   Smokeless tobacco: Never   Tobacco comments:    4-5 cigs daily  is aware she needs to quit  Vaping Use   Vaping status: Never Used  Substance and Sexual Activity   Alcohol use: Never   Drug use: Never   Sexual activity: Not Currently  Other Topics Concern   Not on file  Social History Narrative   Pt is married, 5 sons (4 living), 14 grandchildren, 4 great grandchildren.   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   Social Drivers of Corporate investment banker Strain: Low Risk  (07/31/2022)   Overall Financial Resource Strain (CARDIA)    Difficulty of Paying Living Expenses: Not hard at all  Food Insecurity: No Food Insecurity (07/31/2022)   Hunger Vital Sign    Worried About Running Out of Food in the Last Year: Never true    Ran Out of Food in the Last Year: Never true  Transportation Needs: No Transportation Needs (07/31/2022)   PRAPARE - Administrator, Civil Service (Medical): No    Lack of Transportation (Non-Medical): No  Physical Activity: Inactive (07/31/2022)   Exercise Vital Sign    Days of Exercise per Week: 0 days    Minutes of Exercise per Session: 0 min  Stress: No Stress Concern Present (07/31/2022)   [employer] of Occupational Health - Occupational Stress Questionnaire    Feeling of Stress : Not at all  Social Connections: Moderately Isolated (01/29/2020)   Social Connection and Isolation Panel [NHANES]    Frequency of Communication with Friends and Family: More than three times a week    Frequency of Social Gatherings with Friends and Family: Once a week    Attends Religious Services: Never    Database administrator or Organizations: No    Attends Banker Meetings: Never    Marital Status: Married   Past Surgical History:  Procedure Laterality Date   ANEURYSM COILING  04/2009   Dr. Corliss Skains   APPENDECTOMY     BACK SURGERY  02/28/2021   BACK SURGERY     08/2021   BREAST LUMPECTOMY Right 1986   benign   CYSTOSCOPY W/ RETROGRADES Bilateral 01/19/2023   Procedure: BILATERAL RETROGRADE PYELOGRAM;  Surgeon: Jerilee Field, MD;  Location: [facility];  Service: Urology;  Laterality: Bilateral;   CYSTOSCOPY WITH BIOPSY N/A 01/19/2023   Procedure: CYSTOSCOPY WITH BLADDER BIOPSY POST OP GEMCITABINE;  Surgeon: Jerilee Field, MD;  Location: [facility];  Service: Urology;  Laterality: N/A;  CYSTOSCOPY WITH FULGERATION N/A 01/19/2023    Procedure: CYSTOSCOPY WITH FULGERATION;  Surgeon: Jerilee Field, MD;  Location: [facility];  Service: Urology;  Laterality: N/A;  75 MINS FOR CASE   DIAGNOSTIC LAPAROSCOPY     Proable laparoscopic right colectomy 03-14-18 Dr. Johna Sheriff   EYE SURGERY Bilateral    ioc lens for cataracts   LAPAROSCOPIC RIGHT COLECTOMY Right 03/14/2018   Procedure: LAPAROSCOPIC ASSISTED  RIGHT COLECTOMY;  Surgeon: Glenna Fellows, MD;  Location: [facility];  Service: General;  Laterality: Right;   LAPAROSCOPY N/A 03/14/2018   Procedure: LAPAROSCOPY;  Surgeon: Glenna Fellows, MD;  Location: [facility];  Service: General;  Laterality: N/A;   LEFT HEART CATHETERIZATION WITH CORONARY ANGIOGRAM N/A 08/05/2014   Procedure: LEFT HEART CATHETERIZATION WITH CORONARY ANGIOGRAM;  Surgeon: Iran Ouch, MD;  Location: [facility];  Service: Cardiovascular;  Laterality: N/A;   LUMBAR LAMINECTOMY N/A 02/28/2021   Procedure: LEFT L1-L2 MICRODISCECTOMY AND FASCIECTOMY;  Surgeon: Eldred Manges, MD;  Location: [facility];  Service: Orthopedics;  Laterality: N/A;   TONSILLECTOMY     TOTAL ABDOMINAL HYSTERECTOMY  1970   complete   TRANSLABYRINTHINE PROCEDURE  2002   [facility] Dr. Erroll Luna tumor removal   TRANSURETHRAL RESECTION OF BLADDER TUMOR N/A 02/08/2018   Procedure: TRANSURETHRAL RESECTION OF BLADDER TUMOR (TURBT)/ POSTOPERATIVE INSTILLATION OF CHEMO THERAPY;  Surgeon: Jerilee Field, MD;  Location: [facility];  Service: Urology;  Laterality: N/A;   Past Surgical History:  Procedure Laterality Date   ANEURYSM COILING  04/2009   Dr. Corliss Skains   APPENDECTOMY     BACK SURGERY  02/28/2021   BACK SURGERY     08/2021   BREAST LUMPECTOMY Right 1986   benign   CYSTOSCOPY W/ RETROGRADES Bilateral 01/19/2023   Procedure: BILATERAL RETROGRADE PYELOGRAM;  Surgeon: Jerilee Field, MD;  Location: [facility];  Service: Urology;  Laterality: Bilateral;   CYSTOSCOPY WITH BIOPSY N/A 01/19/2023   Procedure: CYSTOSCOPY WITH BLADDER  BIOPSY POST OP GEMCITABINE;  Surgeon: Jerilee Field, MD;  Location: [facility];  Service: Urology;  Laterality: N/A;   CYSTOSCOPY WITH FULGERATION N/A 01/19/2023   Procedure: CYSTOSCOPY WITH FULGERATION;  Surgeon: Jerilee Field, MD;  Location: [facility];  Service: Urology;  Laterality: N/A;  75 MINS FOR CASE   DIAGNOSTIC LAPAROSCOPY     Proable laparoscopic right colectomy 03-14-18 Dr. Johna Sheriff   EYE SURGERY Bilateral    ioc lens for cataracts   LAPAROSCOPIC RIGHT COLECTOMY Right 03/14/2018   Procedure: LAPAROSCOPIC ASSISTED  RIGHT COLECTOMY;  Surgeon: Glenna Fellows, MD;  Location: [facility];  Service: General;  Laterality: Right;   LAPAROSCOPY N/A 03/14/2018   Procedure: LAPAROSCOPY;  Surgeon: Glenna Fellows, MD;  Location: [facility];  Service: General;  Laterality: N/A;   LEFT HEART CATHETERIZATION WITH CORONARY ANGIOGRAM N/A 08/05/2014   Procedure: LEFT HEART CATHETERIZATION WITH CORONARY ANGIOGRAM;  Surgeon: Iran Ouch, MD;  [facility]: [facility];  Service: Cardiovascular;  Laterality: N/A;   LUMBAR LAMINECTOMY N/A 02/28/2021   Procedure: LEFT L1-L2 MICRODISCECTOMY AND FASCIECTOMY;  Surgeon: Eldred Manges, MD;  Location: [facility];  Service: Orthopedics;  Laterality: N/A;   TONSILLECTOMY     TOTAL ABDOMINAL HYSTERECTOMY  1970   complete   TRANSLABYRINTHINE PROCEDURE  2002   [facility] Dr. Erroll Luna tumor removal   TRANSURETHRAL RESECTION OF BLADDER TUMOR N/A 02/08/2018   Procedure: TRANSURETHRAL RESECTION OF BLADDER TUMOR (TURBT)/ POSTOPERATIVE INSTILLATION OF CHEMO THERAPY;  Surgeon: Jerilee Field, MD;  Location: [facility];  Service:  Urology;  Laterality: N/A;   Past Medical History:  Diagnosis Date   AN (acoustic neuroma) (HCC)    deafness in R ear   Aneurysm (HCC)    basilar tip aneursym s/p stent assisted coiling 04/26/09   Anxiety    Arthritis    Bladder cancer (HCC)    Cataract    Cigarette smoker    COPD (chronic obstructive pulmonary disease) (HCC)     mild, no inhalers or oxygen used   Depression    History of kidney stones    Hyperlipidemia    Hypertension    Hypothyroidism    IBS (irritable bowel syndrome)    Mitral valve prolapse    mild takes beta blocker   Osteopenia    Stroke (HCC)    x 3 ministrokes last one feb 2014   Venous insufficiency    Vitamin D deficiency    BP (!) 164/75   Pulse 67   Ht 5\' 2"  (1.575 m)   Wt 131 lb (59.4 kg)   SpO2 96%   BMI 23.96 kg/m   Opioid Risk Score:  Fall Risk Score:  `1  Depression screen [facility] 2/9     04/17/2023   10:40 AM 03/29/2023    9:10 AM 01/25/2023    9:28 AM 12/08/2022    7:55 AM 12/07/2022   11:05 AM 10/10/2022    9:19 AM 09/07/2022    8:18 AM  Depression screen PHQ 2/9  Decreased Interest 2 1 1 3  0 1 3  Down, Depressed, Hopeless 2 1 1 3  0 1 2  PHQ - 2 Score 4 2 2 6  0 2 5  Altered sleeping    3   1  Tired, decreased energy    3   3  Change in appetite    0   3  Feeling bad or failure about yourself     3   3  Trouble concentrating    0   3  Moving slowly or fidgety/restless    0   0  Suicidal thoughts    0   0  PHQ-9 Score    15   18  Difficult doing work/chores    Very difficult   Extremely dIfficult    Review of Systems  Musculoskeletal:  Positive for back pain and neck pain.       Pain in right upper leg, right groin   All other systems reviewed and are negative.      Objective:   Physical Exam     05/24/2023    9:18 AM 05/24/2023    9:05 AM 04/17/2023   10:38 AM  Vitals with BMI  Height  5\' 2"  5\' 2"   Weight  131 lbs 130 lbs 6 oz  BMI  23.95 23.84  Systolic 164 157 433  Diastolic 75 83 77  Pulse 67 66 67     Physical Exam Gen: no distress, normal appearing HEENT: oral mucosa pink and moist, NCAT Cardio: Reg rate Chest: normal effort, normal rate of breathing Abd: soft, non-distended Ext: no edema Psych: Very pleasant Skin: intact Neuro: Alert and awake, follows commands, cranial nerves II through XII grossly intact, speech and language  normal Sensation to light touch in all 4 extremities Moving bilateral lower extremities to gravity and resistance No ankle clonus Musculoskeletal: Slump test negative TTP lumbar and thoracic spine Facet loading positive -unchanged, pain with spinal extension Pain with right hip flexion Pain with right hip internal and external rotation Mild  pain at right greater trochanter Antalgic gait   MRI T spine 05/01/22 FINDINGS: Alignment:  Physiologic.   Vertebrae: No acute fracture, evidence of discitis, or aggressive bone lesion. T7 vertebral body hemangioma noted.   Cord:  Normal signal and morphology.   Paraspinal and other soft tissues: No acute paraspinal abnormality.   Disc levels:   Disc spaces: Degenerative disease with disc height loss at T10-11 and T11-12. Degenerative disease with disc height loss at T1-2. Partially visualized on the scout images is degenerative disease with disc height loss at C3-4, C4-5 and C5-6.   T1-T2: Mild broad-based disc bulge. Severe right and moderate left foraminal stenosis. No spinal stenosis.   T2-T3: No disc protrusion, foraminal stenosis or central canal stenosis.   T3-T4: No disc protrusion, foraminal stenosis or central canal stenosis.   T4-T5: No disc protrusion, foraminal stenosis or central canal stenosis.   T5-T6: No disc protrusion, foraminal stenosis or central canal stenosis.   T6-T7: No disc protrusion, foraminal stenosis or central canal stenosis.   T7-T8: No disc protrusion, foraminal stenosis or central canal stenosis.   T8-T9: No disc protrusion, foraminal stenosis or central canal stenosis.   T9-T10: No disc protrusion, foraminal stenosis or central canal stenosis.   T10-T11: Mild broad-based disc bulge. No foraminal or central canal stenosis. Right perineural cyst noted.   T11-T12: No disc protrusion, foraminal stenosis or central canal stenosis.   IMPRESSION: 1. At T1-2 there is a mild broad-based  disc bulge. Severe right and moderate left foraminal stenosis. 2. No acute osseous injury of the thoracic spine.   L spine MRI 12/10/21 FINDINGS: Segmentation: Standard. Lowest well-formed disc space labeled the L5-S1 level.   Alignment: Up to 4 mm anterolisthesis of L5 on S1, stable. Trace anterolisthesis of L3 on L4, also relatively stable. Straightening of the normal lumbar lordosis elsewhere. Underlying sigmoid scoliosis.   Vertebrae: Susceptibility artifact from interval PLIF at L1-2. Vertebral body height maintained without acute or chronic fracture. Bone marrow signal intensity within normal limits. Few small benign hemangiomata noted. No worrisome osseous lesions. Mild reactive marrow edema noted about the partially visualized T10-11 interspace. Reactive edema present about the right L5-S1 facet due to facet arthritis.   Conus medullaris and cauda equina: Conus extends to the L1-2 level. Conus and cauda equina appear normal.   Paraspinal and other soft tissues: Postoperative changes within the posterior paraspinous soft tissues without adverse features. Few scattered cysts noted about the partially visualized kidneys, similar to prior, likely benign. No follow-up imaging recommended.   Disc levels:   T11-12: Seen only on sagittal projection. Mild disc bulge with reactive endplate spurring. Left worse than right facet hypertrophy. No significant spinal stenosis. Foramina appear patent.   T12-L1: Chronic endplate Schmorl's node deformities without significant disc bulge. Mild right-sided facet hypertrophy. No significant stenosis.   L1-2: Interval PLIF. No residual or recurrent spinal stenosis. Foramina appear patent.   L2-3: Disc bulge with disc desiccation and intervertebral disc space narrowing. Disc bulging eccentric to the left. Reactive endplate spurring. Moderate left with mild right facet arthrosis. Resultant mild narrowing of the left lateral recess. Mild  left L2 foraminal narrowing. Right neural foramen remains patent. Appearance is stable.   L3-4: Degenerative intervertebral disc space narrowing with diffuse disc bulge and disc desiccation. Disc bulging eccentric to the left with left-sided reactive endplate spurring. Resultant extraforaminal disc osteophyte contacts the exiting left L3 nerve root as it courses of the left neural foramen (series 101, image 20). Moderate left with  mild right facet arthrosis. Resultant mild narrowing of the left lateral recess. Mild left L3 foraminal stenosis. Right neural foramen remains patent. Appearance is stable.   L4-5: Disc bulge with disc desiccation. Reactive endplate spurring. Mild to moderate facet hypertrophy. No more than mild narrowing of the lateral recesses bilaterally. Mild to moderate right with mild left L4 foraminal stenosis. Appearance is stable.   L5-S1: Anterolisthesis. Disc desiccation with broad posterior disc bulge. Severe right-sided facet arthrosis with reactive marrow edema. Moderate left-sided foraminal narrowing. No significant spinal stenosis. Mild right L5 foraminal stenosis. Appearance is similar.   IMPRESSION: 1. Interval PLIF at L1-2 without residual or recurrent stenosis. 2. Multilevel degenerative spondylosis elsewhere throughout the lumbar spine with resultant mild left lateral recess and foraminal stenosis at L2-3 and L3-4, and mild to moderate bilateral foraminal narrowing at L4-5 and L5-S1 as above. 3. Advanced right-sided facet arthrosis at L5-S1 with associated reactive marrow edema, which could serve as a source for lower back pain.  T-spine   R hip MRI 05/05/23 IMPRESSION: 1. Mild osteoarthritis of the hips. 2. Suspected focal tear of the anterior superior labrum with possible small defect in the adjacent articular cartilage. 3. Minimal partial tearing of the right proximal hamstring tendon with more striking partial tearing of the left proximal  hamstring tendon. 4. Spurring of the pubic bones with low-level edema signal in both pubic bones, degenerative arthropathy versus less likely osteitis pubis. 5. Fluid signal intensity inferiorly along the pubic symphysis and I cannot completely exclude the possibility of a right adductor aponeurotic tear.      Assessment & Plan:   Chronic lumbar and thoracic back pain with history of prior lumbar surgery, SI joint pain -Has multi level degenerative spondylosis -Hx of  L1-L2 TLIF -UDS and pain agreement completed prior visit -Continue hydrocodone at 5 mg dose Q4h PRN, no more than 3 doses a day #90 with each refill- call when low  -Pt seen by Dr. Wynn Banker for left SI joint injection- helped for few days.  Orthopedics has noted some pain in her right SI joint -Consulted Dr. Wynn Banker for medial branch blocks bilateral L3-L4, L4-L5, L5-S1 she reports 80% improvement after procedure, is scheduled to follow-up with Dr. Wynn Banker for additional procedure -Pt has allergy noted to hydrocodone in chart-she insists this is not correct and she does not have this allergy -Zynex TENS and Heat/Cold therapy device ordered prior visit -Will reorder gabapentin -dose limited by sedation -Physical therapy-patient reports this did not provide benefit -Continue to monitor pill count, PDMP, UDS random   R anterior hip pain -Continue Voltaren gel, continue medications as above -Patient reported short-term improvement after hip injection by orthopedics   THC use history -Patient reports she is not currently using -Continue random UDS   HTN -Advised follow-up with PCP

## 2023-05-29 LAB — TOXASSURE SELECT,+ANTIDEPR,UR

## 2023-06-08 ENCOUNTER — Encounter (HOSPITAL_BASED_OUTPATIENT_CLINIC_OR_DEPARTMENT_OTHER): Payer: Medicare Other | Admitting: Physical Medicine & Rehabilitation

## 2023-06-08 ENCOUNTER — Telehealth: Payer: Self-pay

## 2023-06-08 ENCOUNTER — Encounter: Payer: Self-pay | Admitting: Physical Medicine & Rehabilitation

## 2023-06-08 VITALS — BP 145/75 | HR 69 | Temp 98.0°F | Ht 62.0 in | Wt 134.0 lb

## 2023-06-08 DIAGNOSIS — M47817 Spondylosis without myelopathy or radiculopathy, lumbosacral region: Secondary | ICD-10-CM

## 2023-06-08 DIAGNOSIS — M25551 Pain in right hip: Secondary | ICD-10-CM | POA: Diagnosis not present

## 2023-06-08 DIAGNOSIS — G894 Chronic pain syndrome: Secondary | ICD-10-CM | POA: Diagnosis not present

## 2023-06-08 DIAGNOSIS — Z79891 Long term (current) use of opiate analgesic: Secondary | ICD-10-CM | POA: Diagnosis not present

## 2023-06-08 DIAGNOSIS — M961 Postlaminectomy syndrome, not elsewhere classified: Secondary | ICD-10-CM | POA: Diagnosis not present

## 2023-06-08 DIAGNOSIS — Z5181 Encounter for therapeutic drug level monitoring: Secondary | ICD-10-CM | POA: Diagnosis not present

## 2023-06-08 MED ORDER — LIDOCAINE HCL 1 % IJ SOLN
4.0000 mL | Freq: Once | INTRAMUSCULAR | Status: AC
Start: 2023-06-08 — End: 2023-06-08
  Administered 2023-06-08: 4 mL

## 2023-06-08 MED ORDER — LIDOCAINE HCL (PF) 2 % IJ SOLN
2.0000 mL | Freq: Once | INTRAMUSCULAR | Status: AC
  Administered 2023-06-08: 2 mL

## 2023-06-08 MED ORDER — IOHEXOL 180 MG/ML  SOLN
3.0000 mL | Freq: Once | INTRAMUSCULAR | Status: AC
  Administered 2023-06-08: 3 mL via INTRAVENOUS

## 2023-06-08 NOTE — Patient Instructions (Signed)

## 2023-06-08 NOTE — Progress Notes (Signed)
  PROCEDURE RECORD [facility]   Name: Ana Santos DOB:Nov 10, 1940 MRN: [id number]  Date:06/08/2023  Physician: Claudette Laws, MD    Nurse/CMA: Nedra Hai CMA  Allergies:  Allergies  Allergen Reactions   Prednisone Other (See Comments)    Hallucinations   Atorvastatin Other (See Comments)     Lipitor caused arm pain (3/09)   Hydrocodone-Acetaminophen Other (See Comments)    hallucinations   Morphine Other (See Comments)    hallucinations   Nortriptyline Nausea And Vomiting    Caused nausea and vomiting and shaking, kept her up all night   Topamax [Topiramate] Nausea And Vomiting    *dizziness*   Tramadol Other (See Comments)    withdrawal    Consent Signed: Yes.    Is patient diabetic? No.  CBG today? .  Pregnant: No. LMP: No LMP recorded. Patient has had a hysterectomy. (age [age])  Anticoagulants: no Anti-inflammatory: no Antibiotics: no  Procedure: Bilateral L2-3-4-5 Medial Branch Blocks  Position: Prone Start Time: 10:20 am  End Time: 10:35 am  Fluoro Time: 60  RN/CMA Shanoah Asbill RMA Lee, CMA    Time 10:03AM 10:41 am    BP 145/75 133/78    Pulse 69 71    Respirations 16 16    O2 Sat 98 97    S/S 6 6    Pain Level 6/10 2/10     D/C home with Husband, [name] & O X 3, D/C instructions reviewed, and sits independently.

## 2023-06-08 NOTE — Progress Notes (Signed)
Bilateral Lumbar L2, L3, L4  medial branch blocks and L 5 dorsal ramus injection under fluoroscopic guidance  Indication: Lumbar pain which is not relieved by medication management or other conservative care and interfering with self-care and mobility.No iohexol allergy   Informed consent was obtained after describing risks and benefits of the procedure with the patient, this includes bleeding, infection, paralysis and medication side effects.  The patient wishes to proceed and has given written consent.  The patient was placed in prone position.  The lumbar area was marked and prepped with Betadine.  One mL of 1% lidocaine was injected into each of 6 areas into the skin and subcutaneous tissue.  Then a 22-gauge 3.5in spinal needle was inserted targeting the junction of the left S1 superior articular process and sacral ala junction. Needle was advanced under fluoroscopic guidance.  Bone contact was made.Omnipaque 180 was injected x 0.5 mL demonstrating no intravascular uptake.  Then a solution  of 2% MPF lidocaine was injected x 0.5 mL.  Then the left L5 superior articular process in transverse process junction was targeted.  Bone contact was made. Omnipaque 180 was injected x 0.5 mL demonstrating no intravascular uptake. Then a solution containing  2% MPF lidocaine was injected x 0.5 mL.  Then the left L4 superior articular process in transverse process junction was targeted.  Bone contact was made. Omnipaque 180 was injected x 0.5 mL demonstrating no intravascular uptake.  Then a solution containing2% MPF lidocaine was injected x 0.5 mL.  Then the left L3 superior articular process in transverse process junction was targeted.  Bone contact was made. Omnipaque 180 was injected x 0.5 mL demonstrating no intravascular uptake.  Then a solution containing2% MPF lidocaine was injected x 0.5 mL.This same procedure was performed on the right side using the same needle, technique and injectate.  Patient tolerated  procedure well.  Post procedure instructions were given.  Preinjection pain 6/10 Post injection pain 2/10  Schedule for RF of same nerves Right side first

## 2023-06-08 NOTE — Telephone Encounter (Signed)
Patient called for a medication refill. She has been informed a refill is already available.

## 2023-06-11 ENCOUNTER — Encounter: Payer: Self-pay | Admitting: Family Medicine

## 2023-06-11 ENCOUNTER — Ambulatory Visit (INDEPENDENT_AMBULATORY_CARE_PROVIDER_SITE_OTHER): Payer: Medicare Other | Admitting: Family Medicine

## 2023-06-11 VITALS — BP 116/70 | HR 61 | Temp 98.7°F | Ht 62.0 in | Wt 133.0 lb

## 2023-06-11 DIAGNOSIS — I10 Essential (primary) hypertension: Secondary | ICD-10-CM | POA: Diagnosis not present

## 2023-06-11 DIAGNOSIS — M9979 Connective tissue and disc stenosis of intervertebral foramina of abdomen and other regions: Secondary | ICD-10-CM | POA: Diagnosis not present

## 2023-06-11 DIAGNOSIS — F418 Other specified anxiety disorders: Secondary | ICD-10-CM

## 2023-06-11 DIAGNOSIS — M1611 Unilateral primary osteoarthritis, right hip: Secondary | ICD-10-CM

## 2023-06-11 DIAGNOSIS — M519 Unspecified thoracic, thoracolumbar and lumbosacral intervertebral disc disorder: Secondary | ICD-10-CM | POA: Diagnosis not present

## 2023-06-11 DIAGNOSIS — Z23 Encounter for immunization: Secondary | ICD-10-CM

## 2023-06-11 DIAGNOSIS — E039 Hypothyroidism, unspecified: Secondary | ICD-10-CM

## 2023-06-11 MED ORDER — DULOXETINE HCL 20 MG PO CPEP: 20.0000 mg | ORAL_CAPSULE | Freq: Every day | ORAL | 3 refills | Status: DC

## 2023-06-11 NOTE — Assessment & Plan Note (Signed)
Dr. Roda Shutters has recommended a hip joint replacement. Ana Santos is not interested in additional surgery. However, she continues to struggle with her pain issues.

## 2023-06-11 NOTE — Assessment & Plan Note (Signed)
Continues to work with Dr. Wynn Banker. He will continue to manage her Vicodin. Ana Santos also will continue gabapentin 100 mg 1-2 caps TID. I will switch her sertraline to duloxetine to see if this manges her depression, but also helps with her chronic pain issues.

## 2023-06-11 NOTE — Assessment & Plan Note (Signed)
TSH at goal. Continue levothyroxine 50 mcg daily and 1/2 of a 25 mcg tablet daily.

## 2023-06-11 NOTE — Assessment & Plan Note (Signed)
I will switch her sertraline to duloxetine to see if this manges her depression, but also helps with her chronic pain issues.

## 2023-06-11 NOTE — Assessment & Plan Note (Signed)
Blood pressure is in good control. Continue amlodipine 5 mg daily. 

## 2023-06-11 NOTE — Progress Notes (Signed)
[facility] [address] [place] [address] Dept: [phone] Dept Fax: [phone]  Chronic Care Office Visit  Subjective:    Patient ID: Ana Santos, female    DOB: [birth year], 82 y.o..   MRN: [id number]  Chief Complaint  Patient presents with   Hypertension    3 month f/u HTN.  C/o having some depression issues.     History of Present Illness:  Patient is in today for reassessment of chronic medical issues.  [name] has a history of hypertension, managed with amlodipine 5 mg daily. She also has hyperlipidemia, which is managed with pravastatin 40 mg daily.   [name] has a history of hypothyroidism. She is managed on 50 mcg daily and 25 mcg 1/2 tab daily (total daily dose of 62.5 mcg).      [name] has a history of a fall in Sept. 2021. An MRI scan in May showed multi-level degenerative lumbar disease with foraminal stenoses. She had multiple ESIs without improvement. She had a left L1-L2 microdiscectomy on 02/28/2021. She had a lumbar fusion on 08/23/2021 due to recurrent disc herniation s/p microdiscectomy. She has had further ESIs and is being seen by pain management ([facility]). She is being managed on Vicodin 5-325 mg TID. She had a foraminal anesthetic injection on Friday, that she notes did not really help anything. Dr. Wynn Banker was considering a radiofrequency ablation if there was temporary improvement. [name] notes all of this does wear on her emotions. She states her husband does try and help her out quite a bit. She takes a pain pill in the morning and then sits with a heating pad until her pain reduces, so she can get up and tend to housework. She does admit to doing some considerable cooking on Christmas. She felt compelled to cook for her family.   [name] has a history of bladder cancer.  She underwent cystoscopy and biopsies in August, which did show recurrence of noninvasive, high-grade  papillary urothelial carcinoma. She is now back on a course of chemotherapy.  Past Medical History: Patient Active Problem List   Diagnosis Date Noted   Bladder cancer (HCC) 03/12/2023   Other secondary scoliosis, lumbar region 08/23/2021    Class: Chronic   Fusion of spine of lumbar region 08/23/2021   Lumbar post-laminectomy syndrome 06/15/2021   Lumbosacral spondylosis without myelopathy 02/28/2021   Foraminal stenosis due to intervertebral disc disease 01/21/2021   History of bladder cancer 01/21/2021   Labyrinthitis 12/08/2019   Left-sided low back pain without sciatica 05/05/2019   Visual loss, transient, bilateral 02/21/2019   Essential hypertension 02/21/2019   Colonic mass s/p lap ileocectomy 03/14/2018 03/14/2018   Acute lower GI bleeding 03/14/2018   Chronic anticoagulation 03/14/2018   Chronic tension-type headache, not intractable 05/31/2015   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) 03/11/2014   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 Corliss Skains   APPENDECTOMY     BACK  SURGERY  02/28/2021   BACK SURGERY     08/2021   BREAST LUMPECTOMY Right 1986   benign   CYSTOSCOPY W/ RETROGRADES Bilateral 01/19/2023   Procedure: BILATERAL RETROGRADE PYELOGRAM;  Surgeon: Jerilee Field, MD;  Location: [facility];  Service: Urology;  Laterality: Bilateral;   CYSTOSCOPY WITH BIOPSY N/A 01/19/2023   Procedure:  CYSTOSCOPY WITH BLADDER BIOPSY POST OP GEMCITABINE;  Surgeon: Jerilee Field, MD;  Location: [facility];  Service: Urology;  Laterality: N/A;   CYSTOSCOPY WITH FULGERATION N/A 01/19/2023   Procedure: CYSTOSCOPY WITH FULGERATION;  Surgeon: Jerilee Field, MD;  Location: [facility];  Service: Urology;  Laterality: N/A;  75 MINS FOR CASE   DIAGNOSTIC LAPAROSCOPY     Proable laparoscopic right colectomy 03-14-18 Dr. Johna Sheriff   EYE SURGERY Bilateral    ioc lens for cataracts   LAPAROSCOPIC RIGHT COLECTOMY Right 03/14/2018   Procedure: LAPAROSCOPIC ASSISTED  RIGHT COLECTOMY;  Surgeon: Glenna Fellows, MD;  Location: [facility];  Service: General;  Laterality: Right;   LAPAROSCOPY N/A 03/14/2018   Procedure: LAPAROSCOPY;  Surgeon: Glenna Fellows, MD;  Location: [facility];  Service: General;  Laterality: N/A;   LEFT HEART CATHETERIZATION WITH CORONARY ANGIOGRAM N/A 08/05/2014   Procedure: LEFT HEART CATHETERIZATION WITH CORONARY ANGIOGRAM;  Surgeon: Iran Ouch, MD;  Location: [facility];  Service: Cardiovascular;  Laterality: N/A;   LUMBAR LAMINECTOMY N/A 02/28/2021   Procedure: LEFT L1-L2 MICRODISCECTOMY AND FASCIECTOMY;  Surgeon: Eldred Manges, MD;  Location: [facility];  Service: Orthopedics;  Laterality: N/A;   TONSILLECTOMY     TOTAL ABDOMINAL HYSTERECTOMY  1970   complete   TRANSLABYRINTHINE PROCEDURE  2002   [facility] Dr. Erroll Luna tumor removal   TRANSURETHRAL RESECTION OF BLADDER TUMOR N/A 02/08/2018   Procedure: TRANSURETHRAL RESECTION OF BLADDER TUMOR (TURBT)/ POSTOPERATIVE INSTILLATION OF CHEMO THERAPY;  Surgeon: Jerilee Field, MD;  Location: [facility];  Service: Urology;  Laterality: N/A;   Family History  Problem Relation Age of Onset   Heart disease Mother    Cancer Mother        Lung   Heart attack Mother    Diabetes Mother    Cancer Father        Lung   Diabetes Sister    Cancer Sister        unkown type   Diabetes Brother    Lung cancer Brother        Lung    AAA (abdominal aortic aneurysm) Neg Hx    Colon cancer Neg Hx    Stomach cancer Neg Hx    Esophageal cancer Neg Hx    Liver cancer Neg Hx    Pancreatic cancer Neg Hx    Rectal cancer Neg Hx    Outpatient Medications Prior to Visit  Medication Sig Dispense Refill   amLODipine (NORVASC) 5 MG tablet TAKE 1 TABLET DAILY 90 tablet 3   Cholecalciferol (VITAMIN D-3) 125 MCG (5000 UT) TABS Take 5,000 Units by mouth daily.     clopidogrel (PLAVIX) 75 MG tablet TAKE 1 TABLET DAILY 90 tablet 3   diclofenac Sodium (VOLTAREN ARTHRITIS PAIN) 1 % GEL Apply 4 g topically 4 (four) times daily. 150 g 3   gabapentin (NEURONTIN) 100 MG capsule Take 1-2 capsules (100-200 mg total) by mouth 3 (three) times daily. 180 capsule 2   HYDROcodone-acetaminophen (NORCO/VICODIN) 5-325 MG tablet Take 1 tablet by mouth every 8 (eight) hours as needed for moderate pain (pain score 4-6). 90 tablet 0   hydroxypropyl  methylcellulose / hypromellose (ISOPTO TEARS / GONIOVISC) 2.5 % ophthalmic solution Place 1 drop into both eyes at bedtime.     levothyroxine (SYNTHROID) 25 MCG tablet Take 0.5 tablets (12.5 mcg total) by mouth daily. Take together with 50 mcg tablet (total daily dose of 62.5 mcg) 90 tablet 3   levothyroxine (SYNTHROID) 50 MCG tablet Take 1 tablet (50 mcg total) by mouth daily. 90 tablet 3   methylPREDNISolone (MEDROL DOSEPAK) 4 MG TBPK tablet Take as prescribed on the box 21 tablet 0   pramipexole (MIRAPEX) 0.5 MG tablet TAKE 1 TABLET NIGHTLY ONE HOUR PRIOR TO GOING TO BED AS NEEDED FOR RESTLESS LEG SYNDROME 90 tablet 3   pravastatin (PRAVACHOL) 40 MG tablet TAKE 1 TABLET DAILY 90 tablet 2   SYNTHROID 75 MCG tablet Take 75 mcg by mouth daily.     sertraline (ZOLOFT) 50 MG tablet Take 1.5 tablets (75 mg total) by mouth daily. 135 tablet 3   Facility-Administered Medications Prior to Visit  Medication Dose Route Frequency Provider Last Rate Last Admin   gemcitabine (GEMZAR) chemo syringe for bladder instillation  2,000 mg  2,000 mg Bladder Instillation Once Jerilee Field, MD       Allergies  Allergen Reactions   Prednisone Other (See Comments)    Hallucinations   Atorvastatin Other (See Comments)     Lipitor caused arm pain (3/09)   Hydrocodone-Acetaminophen Other (See Comments)    hallucinations   Morphine Other (See Comments)    hallucinations   Nortriptyline Nausea And Vomiting    Caused nausea and vomiting and shaking, kept her up all night   Topamax [Topiramate] Nausea And Vomiting    *dizziness*   Tramadol Other (See Comments)    withdrawal   Objective:   Today's Vitals   06/11/23 0751  BP: 116/70  Pulse: 61  Temp: 98.7 F (37.1 C)  TempSrc: Temporal  SpO2: 99%  Weight: 133 lb (60.3 kg)  Height: 5\' 2"  (1.575 m)   Body mass index is 24.33 kg/m.   General: Well developed, well nourished. No acute distress. Psych: Alert and oriented. Normal mood and affect.  Health Maintenance Due  Topic Date Due   DTaP/Tdap/Td (1 - Tdap) Never done   Zoster Vaccines- Shingrix (1 of 2) Never done   Medicare Annual Wellness (AWV)  08/01/2023     Assessment & Plan:   Problem List Items Addressed This Visit       Cardiovascular and Mediastinum   Essential hypertension - Primary   Blood pressure is in good control. Continue amlodipine 5 mg daily.         Endocrine   Hypothyroidism   TSH at goal. Continue levothyroxine 50 mcg daily and 1/2 of a 25 mcg tablet daily.        Musculoskeletal and Integument   Foraminal stenosis due to intervertebral disc disease   Continues to work with Dr. Wynn Banker. He will continue to manage her Vicodin. Ms. Hildenbrandt also will continue gabapentin 100 mg 1-2 caps TID. I will switch her sertraline to duloxetine to see if this manges her depression, but also helps with her chronic pain issues.      Osteoarthritis of right hip   Dr. Roda Shutters has recommended a hip joint replacement. Ms. Lorette is not interested in additional surgery. However, she  continues to struggle with her pain issues.        Other   Anxiety with depression   I will switch her sertraline to duloxetine to see if  this manges her depression, but also helps with her chronic pain issues.      Relevant Medications   DULoxetine (CYMBALTA) 20 MG capsule   Other Visit Diagnoses       Need for Tdap vaccination       Relevant Orders   Tdap vaccine greater than or equal to 7yo IM       Return in about 6 weeks (around 07/23/2023) for Reassessment.   Loyola Mast, MD

## 2023-07-13 ENCOUNTER — Encounter: Payer: Self-pay | Admitting: Physical Medicine & Rehabilitation

## 2023-07-13 ENCOUNTER — Encounter: Payer: Medicare Other | Attending: Physical Medicine & Rehabilitation | Admitting: Physical Medicine & Rehabilitation

## 2023-07-13 VITALS — BP 129/83 | HR 67 | Temp 97.7°F | Ht 62.0 in | Wt 129.0 lb

## 2023-07-13 DIAGNOSIS — M47817 Spondylosis without myelopathy or radiculopathy, lumbosacral region: Secondary | ICD-10-CM | POA: Insufficient documentation

## 2023-07-13 MED ORDER — LIDOCAINE HCL 1 % IJ SOLN
5.0000 mL | Freq: Once | INTRAMUSCULAR | Status: AC
  Administered 2023-07-13: 5 mL

## 2023-07-13 MED ORDER — LIDOCAINE HCL (PF) 2 % IJ SOLN
2.0000 mL | Freq: Once | INTRAMUSCULAR | Status: AC
  Administered 2023-07-13: 2 mL

## 2023-07-13 NOTE — Progress Notes (Signed)
RightL5 dorsal ramus., Right L4,  Right L3 and L2 medial branch radio frequency neurotomy under fluoroscopic guidance   Indication: Low back pain due to lumbar spondylosis which has been relieved on 2 occasions by greater than 50% by lumbar medial branch blocks at corresponding levels.  Informed consent was obtained after describing risks and benefits of the procedure with the patient, this includes bleeding, bruising, infection, paralysis and medication side effects. The patient wishes to proceed and has given written consent. The patient was placed in a prone position. The lumbar and sacral area was marked and prepped with Betadine. A 25-gauge 1-1/2 inch needle was inserted into the skin and subcutaneous tissue at 3 sites in one ML of 1% lidocaine was injected into each site. Then a 18-gauge 10 cm radio frequency needle with a 1 cm curved active tip was inserted targeting the Right S1 SAP/sacral ala junction. Bone contact was made and confirmed with lateral imaging.  motor stimulation at 2 Hz confirm proper needle location followed by injection of 1ml 2% MPF lidocaine. Then the Right L5 SAP/transverse process junction was targeted. Bone contact was made and confirmed with lateral imaging.  motor stimulation at 2 Hz confirm proper needle location followed by injection of 1ml 2% MPF lidocaine. Then the Right L4 SAP/transverse process junction was targeted. Bone contact was made and confirmed with lateral imaging. motor stimulation at 2 Hz confirm proper needle location followed by injection of 1ml 2% MPF lidocaine. Radio frequency lesion being at [facility] for 90 seconds was performed. Then the Right L3 SAP/transverse process junction was targeted. Bone contact was made and confirmed with lateral imaging. motor stimulation at 2 Hz confirm proper needle location followed by injection of 1ml 2% MPF lidocaine. Radio frequency lesion being at [facility] for 90 seconds was performed.Needles were removed. Post procedure  instructions and vital signs were performed. Patient tolerated procedure well. Followup appointment was given.   Pt to f/u with Dr Benjie Karvonen to see if pt had good effect from RIght lumbar RF, if so , may be scheduled for the Left L2,3,4,5 RF

## 2023-07-13 NOTE — Progress Notes (Signed)
  PROCEDURE RECORD Ana Santos   Name: Ana Santos DOB:[birth year] MRN: [id number]  Date:07/13/2023  Physician: Claudette Laws, MD    Nurse/CMA: Alvis Edgell RMA  Allergies:  Allergies  Allergen Reactions   Prednisone Other (See Comments)    Hallucinations   Atorvastatin Other (See Comments)     Lipitor caused arm pain (3/09)   Hydrocodone-Acetaminophen Other (See Comments)    hallucinations   Morphine Other (See Comments)    hallucinations   Nortriptyline Nausea And Vomiting    Caused nausea and vomiting and shaking, kept her up all night   Topamax [Topiramate] Nausea And Vomiting    *dizziness*   Tramadol Other (See Comments)    withdrawal    Consent Signed: Yes.    Is patient diabetic? No.  CBG today? .  Pregnant: No. LMP: No LMP recorded. Patient has had a hysterectomy. (age 50-55)  Anticoagulants: no Anti-inflammatory: no Antibiotics: no  Procedure: Right L2,3,4,5 Radiofrequency  Position: Prone Start Time: 10:17 AM  End Time: 10:37 AM  Fluoro Time: 1: 04  RN/CMA Shalay Carder RMA Taliah Porche RMA    Time 9:57 AM 10:40 AM    BP 129/83 136/68    Pulse 67 75    Respirations 16 16    O2 Sat 98 97    S/S 6 6    Pain Level 9/10 2/10     D/C home with Husband, [name] & O X 3, D/C instructions reviewed, and sits independently.

## 2023-07-13 NOTE — Patient Instructions (Signed)
You had a radio frequency procedure today This was done to alleviate joint pain in your lumbar area We injected lidocaine which is a local anesthetic.  You may experience soreness at the injection sites. You may also experienced some irritation of the nerves that were heated I'm recommending ice for 30 minutes every 2 hours as needed for the next 24-48 hours   

## 2023-07-17 ENCOUNTER — Telehealth: Payer: Self-pay | Admitting: Physical Medicine & Rehabilitation

## 2023-07-17 MED ORDER — HYDROCODONE-ACETAMINOPHEN 5-325 MG PO TABS: 1.0000 | ORAL_TABLET | Freq: Three times a day (TID) | ORAL | 0 refills | Status: DC | PRN

## 2023-07-17 NOTE — Telephone Encounter (Signed)
Requesting refill for hydrocodone 

## 2023-07-19 ENCOUNTER — Telehealth: Payer: Self-pay | Admitting: Physical Medicine & Rehabilitation

## 2023-07-19 DIAGNOSIS — Z8551 Personal history of malignant neoplasm of bladder: Secondary | ICD-10-CM | POA: Diagnosis not present

## 2023-07-19 NOTE — Telephone Encounter (Signed)
 Patient called in and left voicemail stating that she is experiencing a lot more pain after procedure completed 1/31

## 2023-07-23 ENCOUNTER — Ambulatory Visit: Payer: Medicare Other | Admitting: Family Medicine

## 2023-07-24 ENCOUNTER — Ambulatory Visit: Payer: Medicare Other | Admitting: Family Medicine

## 2023-07-24 VITALS — BP 120/64 | HR 70 | Temp 97.7°F | Ht 62.0 in | Wt 130.0 lb

## 2023-07-24 DIAGNOSIS — I10 Essential (primary) hypertension: Secondary | ICD-10-CM | POA: Diagnosis not present

## 2023-07-24 DIAGNOSIS — E782 Mixed hyperlipidemia: Secondary | ICD-10-CM

## 2023-07-24 DIAGNOSIS — E039 Hypothyroidism, unspecified: Secondary | ICD-10-CM

## 2023-07-24 DIAGNOSIS — C679 Malignant neoplasm of bladder, unspecified: Secondary | ICD-10-CM | POA: Diagnosis not present

## 2023-07-24 DIAGNOSIS — F418 Other specified anxiety disorders: Secondary | ICD-10-CM

## 2023-07-24 DIAGNOSIS — M961 Postlaminectomy syndrome, not elsewhere classified: Secondary | ICD-10-CM | POA: Diagnosis not present

## 2023-07-24 NOTE — Assessment & Plan Note (Signed)
Lipids are near goal. Continue pravastatin 40 mg daily.

## 2023-07-24 NOTE — Assessment & Plan Note (Signed)
Recent radioablation procedure appears to have helped. Continue working with pain management.

## 2023-07-24 NOTE — Assessment & Plan Note (Signed)
TSH at goal. Continue levothyroxine 50 mcg daily and 1/2 of a 25 mcg tablet daily (total dose = 62.5 mg daily).

## 2023-07-24 NOTE — Assessment & Plan Note (Signed)
Recurrence of bladder cancer. Undergoing chemotherapy with urology.

## 2023-07-24 NOTE — Assessment & Plan Note (Signed)
Mood still in good control on duloxetine 20 mg daily.

## 2023-07-24 NOTE — Assessment & Plan Note (Signed)
Blood pressure is in good control. Continue amlodipine 5 mg daily.

## 2023-07-24 NOTE — Progress Notes (Signed)
[facility] [address] [place] [address] Dept: [phone] Dept Fax: [phone]  Chronic Care Office Visit  Subjective:    Patient ID: Ana Santos, female    DOB: Feb 25, 1941, 83 y.o..   MRN: [id number]  Chief Complaint  Patient presents with   Hypertension    6 week f/u.    History of Present Illness:  Patient is in today for reassessment of chronic medical issues.  Ms. Ana Santos has a history of hypertension, managed with amlodipine 5 mg daily.   Ms. Ana Santos has hyperlipidemia, which is managed with pravastatin 40 mg daily.   Ms. Ana Santos has a history of hypothyroidism. She is managed on 50 mcg daily and 25 mcg 1/2 tab daily (total daily dose of 62.5 mcg).      Ms. Ana Santos has a history of a fall in Sept. 2021. An MRI scan in May showed multi-level degenerative lumbar disease with foraminal stenoses. She had multiple ESIs without improvement. She had a left L1-L2 microdiscectomy on 02/28/2021. She had a lumbar fusion on 08/23/2021 due to recurrent disc herniation s/p microdiscectomy. She has had further ESIs and is being seen by pain management ([facility]). She is being managed on Vicodin 5-325 mg TID. She recently had a right radiofrequency ablation She notes she had more intense pain for about a week after the procedure. However, this has now calmed down and she is feeling her right-sided pain has improved form prior to this procedure. Dr. Wynn Banker is likely to proceed with a similar procedure on the left. She notes her son has provided her with a modified golf cart for helping her get around and do gardening. She is feeling more positive about her life now.   Ms. Ana Santos has a history of bladder cancer.  She underwent cystoscopy and biopsies in August, which did show recurrence of noninvasive, high-grade papillary urothelial carcinoma. She is on a course of intravesicular chemotherapy.  Ms. Ana Santos has a history of  anxiety and depression. At her last visit, I switched her sertraline to duloxetine. Her mood is doing well at this point.   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/23/2021    Class: Chronic   Fusion of spine of lumbar region 08/23/2021   Lumbar post-laminectomy syndrome 06/15/2021   Lumbosacral spondylosis without myelopathy 02/28/2021   Foraminal stenosis due to intervertebral disc disease 01/21/2021   History of bladder cancer 01/21/2021   Labyrinthitis 12/08/2019   Visual loss, transient, bilateral 02/21/2019   Essential hypertension 02/21/2019   Colonic mass s/p lap ileocectomy 03/14/2018 03/14/2018   Acute lower GI bleeding 03/14/2018   Chronic anticoagulation 03/14/2018   Chronic tension-type headache, not intractable 05/31/2015   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) 03/11/2014   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 Corliss Skains   APPENDECTOMY  BACK SURGERY  02/28/2021   BACK SURGERY     08/2021   BREAST LUMPECTOMY Right 1986   benign   CYSTOSCOPY W/ RETROGRADES Bilateral 01/19/2023   Procedure: BILATERAL RETROGRADE PYELOGRAM;  Surgeon: Jerilee Field, MD;  Location: [facility];  Service: Urology;  Laterality: Bilateral;   CYSTOSCOPY WITH  BIOPSY N/A 01/19/2023   Procedure: CYSTOSCOPY WITH BLADDER BIOPSY POST OP GEMCITABINE;  Surgeon: Jerilee Field, MD;  Location: [facility];  Service: Urology;  Laterality: N/A;   CYSTOSCOPY WITH FULGERATION N/A 01/19/2023   Procedure: CYSTOSCOPY WITH FULGERATION;  Surgeon: Jerilee Field, MD;  Location: [facility];  Service: Urology;  Laterality: N/A;  75 MINS FOR CASE   DIAGNOSTIC LAPAROSCOPY     Proable laparoscopic right colectomy 03-14-18 Dr. Johna Sheriff   EYE SURGERY Bilateral    ioc lens for cataracts   LAPAROSCOPIC RIGHT COLECTOMY Right 03/14/2018   Procedure: LAPAROSCOPIC ASSISTED  RIGHT COLECTOMY;  Surgeon: Glenna Fellows, MD;  Location: [facility];  Service: General;  Laterality: Right;   LAPAROSCOPY N/A 03/14/2018   Procedure: LAPAROSCOPY;  Surgeon: Glenna Fellows, MD;  Location: [facility];  Service: General;  Laterality: N/A;   LEFT HEART CATHETERIZATION WITH CORONARY ANGIOGRAM N/A 08/05/2014   Procedure: LEFT HEART CATHETERIZATION WITH CORONARY ANGIOGRAM;  Surgeon: Iran Ouch, MD;  Location: [facility];  Service: Cardiovascular;  Laterality: N/A;   LUMBAR LAMINECTOMY N/A 02/28/2021   Procedure: LEFT L1-L2 MICRODISCECTOMY AND FASCIECTOMY;  Surgeon: Eldred Manges, MD;  Location: [facility];  Service: Orthopedics;  Laterality: N/A;   TONSILLECTOMY     TOTAL ABDOMINAL HYSTERECTOMY  1970   complete   TRANSLABYRINTHINE PROCEDURE  2002   [facility] Dr. Erroll Luna tumor removal   TRANSURETHRAL RESECTION OF BLADDER TUMOR N/A 02/08/2018   Procedure: TRANSURETHRAL RESECTION OF BLADDER TUMOR (TURBT)/ POSTOPERATIVE INSTILLATION OF CHEMO THERAPY;  Surgeon: Jerilee Field, MD;  Location: [facility];  Service: Urology;  Laterality: N/A;   Family History  Problem Relation Age of Onset   Heart disease Mother    Cancer Mother        Lung   Heart attack Mother    Diabetes Mother    Cancer Father        Lung   Diabetes Sister    Cancer Sister        unkown type   Diabetes Brother     Lung cancer Brother        Lung   AAA (abdominal aortic aneurysm) Neg Hx    Colon cancer Neg Hx    Stomach cancer Neg Hx    Esophageal cancer Neg Hx    Liver cancer Neg Hx    Pancreatic cancer Neg Hx    Rectal cancer Neg Hx    Outpatient Medications Prior to Visit  Medication Sig Dispense Refill   amLODipine (NORVASC) 5 MG tablet TAKE 1 TABLET DAILY 90 tablet 3   Cholecalciferol (VITAMIN D-3) 125 MCG (5000 UT) TABS Take 5,000 Units by mouth daily.     clopidogrel (PLAVIX) 75 MG tablet TAKE 1 TABLET DAILY 90 tablet 3   diclofenac Sodium (VOLTAREN ARTHRITIS PAIN) 1 % GEL Apply 4 g topically 4 (four) times daily. 150 g 3   DULoxetine (CYMBALTA) 20 MG capsule Take 1 capsule (20 mg total) by mouth daily. 30 capsule 3   gabapentin (NEURONTIN) 100 MG capsule Take 1-2 capsules (100-200 mg total) by mouth 3 (three) times daily. 180 capsule 2   HYDROcodone-acetaminophen (NORCO/VICODIN) 5-325 MG tablet Take 1 tablet  by mouth every 8 (eight) hours as needed for moderate pain (pain score 4-6). 90 tablet 0   hydroxypropyl methylcellulose / hypromellose (ISOPTO TEARS / GONIOVISC) 2.5 % ophthalmic solution Place 1 drop into both eyes at bedtime.     levothyroxine (SYNTHROID) 25 MCG tablet Take 0.5 tablets (12.5 mcg total) by mouth daily. Take together with 50 mcg tablet (total daily dose of 62.5 mcg) 90 tablet 3   levothyroxine (SYNTHROID) 50 MCG tablet Take 1 tablet (50 mcg total) by mouth daily. 90 tablet 3   methylPREDNISolone (MEDROL DOSEPAK) 4 MG TBPK tablet Take as prescribed on the box 21 tablet 0   pramipexole (MIRAPEX) 0.5 MG tablet TAKE 1 TABLET NIGHTLY ONE HOUR PRIOR TO GOING TO BED AS NEEDED FOR RESTLESS LEG SYNDROME 90 tablet 3   pravastatin (PRAVACHOL) 40 MG tablet TAKE 1 TABLET DAILY 90 tablet 2   SYNTHROID 75 MCG tablet Take 75 mcg by mouth daily.     Facility-Administered Medications Prior to Visit  Medication Dose Route Frequency Provider Last Rate Last Admin   gemcitabine (GEMZAR)  chemo syringe for bladder instillation 2,000 mg  2,000 mg Bladder Instillation Once Jerilee Field, MD       Allergies  Allergen Reactions   Prednisone Other (See Comments)    Hallucinations   Atorvastatin Other (See Comments)     Lipitor caused arm pain (3/09)   Hydrocodone-Acetaminophen Other (See Comments)    hallucinations   Morphine Other (See Comments)    hallucinations   Nortriptyline Nausea And Vomiting    Caused nausea and vomiting and shaking, kept her up all night   Topamax [Topiramate] Nausea And Vomiting    *dizziness*   Tramadol Other (See Comments)    withdrawal   Objective:   Today's Vitals   07/24/23 0803  BP: 120/64  Pulse: 70  Temp: 97.7 F (36.5 C)  TempSrc: Temporal  SpO2: 99%  Weight: 130 lb (59 kg)  Height: 5\' 2"  (1.575 m)   Body mass index is 23.78 kg/m.   General: Well developed, well nourished. No acute distress. Psych: Alert and oriented. Normal mood and affect.  Health Maintenance Due  Topic Date Due   Zoster Vaccines- Shingrix (1 of 2) Never done   Medicare Annual Wellness (AWV)  08/01/2023     Assessment & Plan:   Problem List Items Addressed This Visit       Cardiovascular and Mediastinum   Essential hypertension - Primary   Blood pressure is in good control. Continue amlodipine 5 mg daily.         Endocrine   Hypothyroidism   TSH at goal. Continue levothyroxine 50 mcg daily and 1/2 of a 25 mcg tablet daily (total dose = 62.5 mg daily).        Genitourinary   Bladder cancer (HCC)   Recurrence of bladder cancer. Undergoing chemotherapy with urology.        Other   Anxiety with depression   Mood still in good control on duloxetine 20 mg daily.      Lumbar post-laminectomy syndrome   Recent radioablation procedure appears to have helped. Continue working with pain management.       Mixed hyperlipidemia   Lipids are near goal. Continue pravastatin 40 mg daily.       Return in about 3 months (around  10/21/2023) for Reassessment.   Loyola Mast, MD

## 2023-07-25 DIAGNOSIS — C672 Malignant neoplasm of lateral wall of bladder: Secondary | ICD-10-CM | POA: Diagnosis not present

## 2023-07-25 NOTE — Telephone Encounter (Signed)
Patient states that for 2 days she couldn't move and feeling better. She states she did heat/ice in 30 minute intervals and pain patches.

## 2023-07-26 ENCOUNTER — Encounter: Payer: Medicare Other | Admitting: Physical Medicine & Rehabilitation

## 2023-07-30 ENCOUNTER — Encounter: Payer: Self-pay | Admitting: Physical Medicine & Rehabilitation

## 2023-07-30 ENCOUNTER — Encounter: Payer: Medicare Other | Attending: Physical Medicine & Rehabilitation | Admitting: Physical Medicine & Rehabilitation

## 2023-07-30 VITALS — BP 138/76 | HR 60 | Ht 62.0 in | Wt 130.0 lb

## 2023-07-30 DIAGNOSIS — M961 Postlaminectomy syndrome, not elsewhere classified: Secondary | ICD-10-CM | POA: Diagnosis not present

## 2023-07-30 DIAGNOSIS — M533 Sacrococcygeal disorders, not elsewhere classified: Secondary | ICD-10-CM | POA: Diagnosis not present

## 2023-07-30 DIAGNOSIS — Z79891 Long term (current) use of opiate analgesic: Secondary | ICD-10-CM | POA: Diagnosis not present

## 2023-07-30 DIAGNOSIS — M47817 Spondylosis without myelopathy or radiculopathy, lumbosacral region: Secondary | ICD-10-CM | POA: Insufficient documentation

## 2023-07-30 DIAGNOSIS — G894 Chronic pain syndrome: Secondary | ICD-10-CM | POA: Insufficient documentation

## 2023-07-30 MED ORDER — HYDROCODONE-ACETAMINOPHEN 5-325 MG PO TABS: 1.0000 | ORAL_TABLET | Freq: Three times a day (TID) | ORAL | 0 refills | Status: DC | PRN

## 2023-07-30 NOTE — Progress Notes (Addendum)
 Subjective:    Ana Santos ID: Ana Santos, female    DOB: 12-15-1940, 83 y.o.   MRN: [id number]  HPI    HPI Ana Santos is a 83 y.o. year old female  who  has a past medical history of AN (acoustic neuroma) (HCC), Aneurysm (HCC), Anxiety, Bladder cancer (HCC), Cataract, Cigarette smoker, COPD (chronic obstructive pulmonary disease) (HCC), Coronary artery disease, Depression, Headache, Hyperlipidemia, Hypertension, Hypothyroidism, IBS (irritable bowel syndrome), Mitral valve prolapse, Osteopenia, Stroke (HCC), Venous insufficiency, and Vitamin D deficiency.   They are presenting to PM&R clinic as a new Ana Santos for pain management evaluation. They were referred for treatment of chronic lumbar and thoracic back pain.   She reports that she has had worsening pain for about 4 years.  No consistent pain shooting down her legs.  Pain is stabbing in quality.  Lifting anything makes it worse.  Denies any numbness or paresthesias in her limbs.  She had a prior left L1-2 hemilaminectomy and then later a L1-L2 TLIF in March 2023.  She is previously followed by Dr. Otelia Sergeant.  She is now being seen by Dr. Willia Craze orthopedic spine surgery.  She reports a history of bowel surgery with small intestine removed previously.   Red flag symptoms: Ana Santos denies saddle anesthesia, loss of bowel or bladder continence, new weakness, new numbness/tingling, or pain waking up at nighttime.   Medications tried: Tylenol, advil, lidcaine patch, icy hot with mild benefit She is currently on Plavix which limits her medication options Hydrocodone 10 mg previously helped control her pain  tramadol-this did help her pain however she had very severe withdrawal issues when she did not get her medication on time, would like to avoid this medication if possible Oxycodone helped her pain previously   Other treatments: PT/OT  made it worse Heat pad helps 10-15 ESI injections- reports minimal benefit  L1-2 hemilaminectomy  and then later a L1-L2 TLIF in March 2023.  Without sustained improvement   Interval history 06/27/2022 Ana Santos is here for follow-up regarding her chronic pain.  She continues to have lumbar and thoracic back pain.  She says the pain has been very severe since medications have been discontinued.  She did get some tramadol at the end of December which provided mild benefit to her pain.  She denies any use of other substances or marijuana since her last visit.  She says she regrets having her back surgery done last year.   Interval history 08/29/2022 Ana Santos is here for follow-up regarding her chronic back pain primarily in her lumbar and thoracic back.  She also recently had a left SI joint injection by Dr. Wynn Banker.  This provided short-term pain relief however the pain returned after a day or two.  Hydrocodone 5 mg has been helping her pain and makes it more tolerable.  She is not having any side effects of this medication.  She does report that hydrocodone does not last long enough and this leaves her in significant pain throughout the day.     Interval history 10/10/22 Ana Santos is here for follow-up regarding her chronic pain.  Pill counts appear to be consistent with her hydrocodone.  She is trying only uses medication when the pain is very severe.  No side effects with the hydrocodone reported.  The hydrocodone is helping her lower back pain remains tolerable.  She is also had some discomfort around her right inguinal area/anterior hip.  She says this started after doing some work in  the yard.  She has occasional use heating pads for her lower back and found this to be helpful.  She has not tried TENS before.     Interval History 12/07/22 Ana Santos is here with her husband for follow-up regarding her chronic pain.  She continues to have severe lower back and thoracic back pain.  Back pain will radiate into her thighs.  She denies any recent changes in strength or sensation.   Hydrocodone 5 mg continues to help keep her pain controlled, she usually takes 1 in the morning, and will take another tablet later in the day if needed.  She has not had any recent falls.  No side effects with her medications.  Ana Santos reports she is able to be more active when using the hydrocodone and is able to get done more around her house.       Interval History 01/25/23 Ana Santos is here with her husband following up regarding treatment for her chronic pain.  She continues to have lower back pain with some pain in her mid back and thighs right greater than left.  She also has pain shooting down her right leg.  Ana Santos reports that hydrocodone is helping keep her pain more tolerable.  She is not having any side effects with hydrocodone.  She tried physical therapy but says it made her pain worse.  She has not tried gabapentin, she says she did not know this was available.  Interval History 03/29/23 Ana Santos is here to follow-up on her chronic pain.  Ana Santos reports she had about 80% improvement with bilateral lumbar L2, L3, L4 medial branch blocks with L5 dorsal ramus injection under for scopic guidance.  She also had a hip injection by orthopedics under ultrasound guidance with improvement in her right hip pain for couple weeks.  Hip pain has been returning.  She continues to use hydrocodone when her pain is severe, no side effects with this medication reported.  Hydrocodone allows her to be more active and get out of the house more.  She reports she has been treated for bladder cancer.  Interval History 05/24/23 Ana Santos is here for follow-up regarding her chronic back and hip pain.  Ana Santos was seen by Dr. Roda Shutters for DJD right hip, recommend total hip replacement.  She plans to hold off for now but may consider at a later time.  She had good results with L2-3-4 5 MBB with Dr. Wynn Banker, has follow-up visit scheduled.  She reports in the meantime Norco 5 is keeping her pain at a tolerable level and  this allows her to be more active.  No significant side effects with the medication.  Interval history 07/29/22 Ana Santos is here for follow-up for her chronic back and hip pain.  Ana Santos had right L5 dorsal ramus, right L4, right L3 and L2 medial branch radiofrequency neurotomy under fluoroscopic guidance by Dr. Wynn Banker completed on 07/13/2023.  She reports she initially had severe pain in her right lower back after this procedure.  However after initial pain wore off she noticed a significant reduction over 50% of her right lower back pain.  She reports she can now lift her right leg a lot better than she was able to do previously.  Hydrocodone continues to help her lower back pain, she tries to only use this when pain is severe.  Sometimes she will use 1/2 tab when the pain is more moderate.  No side effects with the medication.   Pain Inventory Average Pain 10 Pain Right Now  8 My pain is constant, sharp, burning, and aching  In the last 24 hours, has pain interfered with the following? General activity 10 Relation with others 0 Enjoyment of life 10 What TIME of day is your pain at its worst? morning , daytime, evening, and night Sleep (in general) Poor  Pain is worse with: walking, sitting, and some activites Pain improves with: heat, medication Relief from Meds: 8  Family History  Problem Relation Age of Onset   Heart disease Mother    Cancer Mother        Lung   Heart attack Mother    Diabetes Mother    Cancer Father        Lung   Diabetes Sister    Cancer Sister        unkown type   Diabetes Brother    Lung cancer Brother        Lung   AAA (abdominal aortic aneurysm) Neg Hx    Colon cancer Neg Hx    Stomach cancer Neg Hx    Esophageal cancer Neg Hx    Liver cancer Neg Hx    Pancreatic cancer Neg Hx    Rectal cancer Neg Hx    Social History   Socioeconomic History   Marital status: Married    Spouse name: Windy Fast   Number of children: 5   Years of education:  Not on file   Highest education level: Not on file  Occupational History   Occupation: alterations   Occupation: Retired    Comment: Neurosurgeon  Tobacco Use   Smoking status: Every Day    Current packs/day: 0.25    Average packs/day: 0.3 packs/day for 50.0 years (12.5 ttl pk-yrs)    Types: Cigarettes   Smokeless tobacco: Never   Tobacco comments:    4-5 cigs daily  is aware she needs to quit  Vaping Use   Vaping status: Never Used  Substance and Sexual Activity   Alcohol use: Never   Drug use: Never   Sexual activity: Not Currently  Other Topics Concern   Not on file  Social History Narrative   Pt is married, 5 sons (4 living), 14 grandchildren, 4 great grandchildren.   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   Social Drivers of Corporate investment banker Strain: Low Risk  (07/31/2022)   Overall Financial Resource Strain (CARDIA)    Difficulty of Paying Living Expenses: Not hard at all  Food Insecurity: No Food Insecurity (07/31/2022)   Hunger Vital Sign    Worried About Running Out of Food in the Last Year: Never true    Ran Out of Food in the Last Year: Never true  Transportation Needs: No Transportation Needs (07/31/2022)   PRAPARE - Administrator, Civil Service (Medical): No    Lack of Transportation (Non-Medical): No  Physical Activity: Inactive (07/31/2022)   Exercise Vital Sign    Days of Exercise per Week: 0 days    Minutes of Exercise per Session: 0 min  Stress: No Stress Concern Present (07/31/2022)   [employer] of Occupational Health - Occupational Stress Questionnaire    Feeling of Stress : Not at all  Social Connections: Moderately Isolated (01/29/2020)   Social Connection and Isolation Panel [NHANES]    Frequency of Communication with Friends and Family: More than three times a week    Frequency of Social Gatherings with Friends and Family: Once a week    Attends  Religious Services: Never    Active  Member of Clubs or Organizations: No    Attends Banker Meetings: Never    Marital Status: Married   Past Surgical History:  Procedure Laterality Date   ANEURYSM COILING  04/2009   Dr. Corliss Skains   APPENDECTOMY     BACK SURGERY  02/28/2021   BACK SURGERY     08/2021   BREAST LUMPECTOMY Right 1986   benign   CYSTOSCOPY W/ RETROGRADES Bilateral 01/19/2023   Procedure: BILATERAL RETROGRADE PYELOGRAM;  Surgeon: Jerilee Field, MD;  Location: [facility];  Service: Urology;  Laterality: Bilateral;   CYSTOSCOPY WITH BIOPSY N/A 01/19/2023   Procedure: CYSTOSCOPY WITH BLADDER BIOPSY POST OP GEMCITABINE;  Surgeon: Jerilee Field, MD;  Location: [facility];  Service: Urology;  Laterality: N/A;   CYSTOSCOPY WITH FULGERATION N/A 01/19/2023   Procedure: CYSTOSCOPY WITH FULGERATION;  Surgeon: Jerilee Field, MD;  Location: [facility];  Service: Urology;  Laterality: N/A;  75 MINS FOR CASE   DIAGNOSTIC LAPAROSCOPY     Proable laparoscopic right colectomy 03-14-18 Dr. Johna Sheriff   EYE SURGERY Bilateral    ioc lens for cataracts   LAPAROSCOPIC RIGHT COLECTOMY Right 03/14/2018   Procedure: LAPAROSCOPIC ASSISTED  RIGHT COLECTOMY;  Surgeon: Glenna Fellows, MD;  Location: [facility];  Service: General;  Laterality: Right;   LAPAROSCOPY N/A 03/14/2018   Procedure: LAPAROSCOPY;  Surgeon: Glenna Fellows, MD;  Location: [facility];  Service: General;  Laterality: N/A;   LEFT HEART CATHETERIZATION WITH CORONARY ANGIOGRAM N/A 08/05/2014   Procedure: LEFT HEART CATHETERIZATION WITH CORONARY ANGIOGRAM;  Surgeon: Iran Ouch, MD;  Location: [facility];  Service: Cardiovascular;  Laterality: N/A;   LUMBAR LAMINECTOMY N/A 02/28/2021   Procedure: LEFT L1-L2 MICRODISCECTOMY AND FASCIECTOMY;  Surgeon: Eldred Manges, MD;  Location: [facility];  Service: Orthopedics;  Laterality: N/A;   TONSILLECTOMY     TOTAL ABDOMINAL HYSTERECTOMY  1970   complete   TRANSLABYRINTHINE PROCEDURE  2002   [facility] Dr. Erroll Luna tumor removal    TRANSURETHRAL RESECTION OF BLADDER TUMOR N/A 02/08/2018   Procedure: TRANSURETHRAL RESECTION OF BLADDER TUMOR (TURBT)/ POSTOPERATIVE INSTILLATION OF CHEMO THERAPY;  Surgeon: Jerilee Field, MD;  Location: [facility];  Service: Urology;  Laterality: N/A;   Past Surgical History:  Procedure Laterality Date   ANEURYSM COILING  04/2009   Dr. Corliss Skains   APPENDECTOMY     BACK SURGERY  02/28/2021   BACK SURGERY     08/2021   BREAST LUMPECTOMY Right 1986   benign   CYSTOSCOPY W/ RETROGRADES Bilateral 01/19/2023   Procedure: BILATERAL RETROGRADE PYELOGRAM;  Surgeon: Jerilee Field, MD;  Location: [facility];  Service: Urology;  Laterality: Bilateral;   CYSTOSCOPY WITH BIOPSY N/A 01/19/2023   Procedure: CYSTOSCOPY WITH BLADDER BIOPSY POST OP GEMCITABINE;  Surgeon: Jerilee Field, MD;  Location: [facility];  Service: Urology;  Laterality: N/A;   CYSTOSCOPY WITH FULGERATION N/A 01/19/2023   Procedure: CYSTOSCOPY WITH FULGERATION;  Surgeon: Jerilee Field, MD;  Location: [facility];  Service: Urology;  Laterality: N/A;  75 MINS FOR CASE   DIAGNOSTIC LAPAROSCOPY     Proable laparoscopic right colectomy 03-14-18 Dr. Johna Sheriff   EYE SURGERY Bilateral    ioc lens for cataracts   LAPAROSCOPIC RIGHT COLECTOMY Right 03/14/2018   Procedure: LAPAROSCOPIC ASSISTED  RIGHT COLECTOMY;  Surgeon: Glenna Fellows, MD;  Location: [facility];  Service: General;  Laterality: Right;   LAPAROSCOPY N/A 03/14/2018   Procedure: LAPAROSCOPY;  Surgeon: Glenna Fellows, MD;  Location:  [facility];  Service: General;  Laterality: N/A;   LEFT HEART CATHETERIZATION WITH CORONARY ANGIOGRAM N/A 08/05/2014   Procedure: LEFT HEART CATHETERIZATION WITH CORONARY ANGIOGRAM;  Surgeon: Iran Ouch, MD;  Location: [facility];  Service: Cardiovascular;  Laterality: N/A;   LUMBAR LAMINECTOMY N/A 02/28/2021   Procedure: LEFT L1-L2 MICRODISCECTOMY AND FASCIECTOMY;  Surgeon: Eldred Manges, MD;  Location: [facility];  Service:  Orthopedics;  Laterality: N/A;   TONSILLECTOMY     TOTAL ABDOMINAL HYSTERECTOMY  1970   complete   TRANSLABYRINTHINE PROCEDURE  2002   [facility] Dr. Erroll Luna tumor removal   TRANSURETHRAL RESECTION OF BLADDER TUMOR N/A 02/08/2018   Procedure: TRANSURETHRAL RESECTION OF BLADDER TUMOR (TURBT)/ POSTOPERATIVE INSTILLATION OF CHEMO THERAPY;  Surgeon: Jerilee Field, MD;  Location: [facility];  Service: Urology;  Laterality: N/A;   Past Medical History:  Diagnosis Date   AN (acoustic neuroma) (HCC)    deafness in R ear   Aneurysm (HCC)    basilar tip aneursym s/p stent assisted coiling 04/26/09   Anxiety    Arthritis    Bladder cancer (HCC)    Cataract    Cigarette smoker    COPD (chronic obstructive pulmonary disease) (HCC)    mild, no inhalers or oxygen used   Depression    History of kidney stones    Hyperlipidemia    Hypertension    Hypothyroidism    IBS (irritable bowel syndrome)    Mitral valve prolapse    mild takes beta blocker   Osteopenia    Stroke (HCC)    x 3 ministrokes last one feb 2014   Venous insufficiency    Vitamin D deficiency    There were no vitals taken for this visit.  Opioid Risk Score:  Fall Risk Score:  `1  Depression screen [facility] 2/9     06/11/2023    8:03 AM 06/08/2023   10:01 AM 05/24/2023    9:20 AM 04/17/2023   10:40 AM 03/29/2023    9:10 AM 01/25/2023    9:28 AM 12/08/2022    7:55 AM  Depression screen PHQ 2/9  Decreased Interest 1 0 0 2 1 1 3   Down, Depressed, Hopeless 3 0 0 2 1 1 3   PHQ - 2 Score 4 0 0 4 2 2 6   Altered sleeping 3      3  Tired, decreased energy 3      3  Change in appetite 3      0  Feeling bad or failure about yourself  0      3  Trouble concentrating 0      0  Moving slowly or fidgety/restless 1      0  Suicidal thoughts 0      0  PHQ-9 Score 14      15  Difficult doing work/chores Somewhat difficult      Very difficult    Review of Systems  Musculoskeletal:  Positive for back pain and neck pain.        Pain in right upper leg, right groin   All other systems reviewed and are negative.      Objective:   Physical Exam     07/24/2023    8:03 AM 07/13/2023    9:48 AM 06/11/2023    7:51 AM  Vitals with BMI  Height 5\' 2"  5\' 2"  5\' 2"   Weight 130 lbs 129 lbs 133 lbs  BMI 23.77 23.59 24.32  Systolic 120 129 782  Diastolic 64 83 70  Pulse 70 67 61     Physical Exam Gen: no distress, normal appearing HEENT: oral mucosa pink and moist, NCAT Cardio: Reg rate Chest: normal effort, normal rate of breathing Abd: soft, non-distended Ext: no edema Psych: Very pleasant Skin: intact Neuro: Alert and awake, follows commands, cranial nerves II through XII grossly intact, speech and language normal Sensation to light touch in all 4 extremities Moving bilateral lower extremities to gravity and resistance No ankle clonus Musculoskeletal:   SLR negative Minimal TTP L-spine, today is more severe on the left than the right Facet loading negative today, positive prior visit Minimal pain with right hip flexion today-improved Mild pain with right hip internal and external rotation Mild TTP at right greater trochanter Mildly antalgic gait   MRI T spine 05/01/22 FINDINGS: Alignment:  Physiologic.   Vertebrae: No acute fracture, evidence of discitis, or aggressive bone lesion. T7 vertebral body hemangioma noted.   Cord:  Normal signal and morphology.   Paraspinal and other soft tissues: No acute paraspinal abnormality.   Disc levels:   Disc spaces: Degenerative disease with disc height loss at T10-11 and T11-12. Degenerative disease with disc height loss at T1-2. Partially visualized on the scout images is degenerative disease with disc height loss at C3-4, C4-5 and C5-6.   T1-T2: Mild broad-based disc bulge. Severe right and moderate left foraminal stenosis. No spinal stenosis.   T2-T3: No disc protrusion, foraminal stenosis or central canal stenosis.   T3-T4: No disc  protrusion, foraminal stenosis or central canal stenosis.   T4-T5: No disc protrusion, foraminal stenosis or central canal stenosis.   T5-T6: No disc protrusion, foraminal stenosis or central canal stenosis.   T6-T7: No disc protrusion, foraminal stenosis or central canal stenosis.   T7-T8: No disc protrusion, foraminal stenosis or central canal stenosis.   T8-T9: No disc protrusion, foraminal stenosis or central canal stenosis.   T9-T10: No disc protrusion, foraminal stenosis or central canal stenosis.   T10-T11: Mild broad-based disc bulge. No foraminal or central canal stenosis. Right perineural cyst noted.   T11-T12: No disc protrusion, foraminal stenosis or central canal stenosis.   IMPRESSION: 1. At T1-2 there is a mild broad-based disc bulge. Severe right and moderate left foraminal stenosis. 2. No acute osseous injury of the thoracic spine.   L spine MRI 12/10/21 FINDINGS: Segmentation: Standard. Lowest well-formed disc space labeled the L5-S1 level.   Alignment: Up to 4 mm anterolisthesis of L5 on S1, stable. Trace anterolisthesis of L3 on L4, also relatively stable. Straightening of the normal lumbar lordosis elsewhere. Underlying sigmoid scoliosis.   Vertebrae: Susceptibility artifact from interval PLIF at L1-2. Vertebral body height maintained without acute or chronic fracture. Bone marrow signal intensity within normal limits. Few small benign hemangiomata noted. No worrisome osseous lesions. Mild reactive marrow edema noted about the partially visualized T10-11 interspace. Reactive edema present about the right L5-S1 facet due to facet arthritis.   Conus medullaris and cauda equina: Conus extends to the L1-2 level. Conus and cauda equina appear normal.   Paraspinal and other soft tissues: Postoperative changes within the posterior paraspinous soft tissues without adverse features. Few scattered cysts noted about the partially visualized  kidneys, similar to prior, likely benign. No follow-up imaging recommended.   Disc levels:   T11-12: Seen only on sagittal projection. Mild disc bulge with reactive endplate spurring. Left worse than right facet hypertrophy. No significant spinal stenosis. Foramina appear patent.   T12-L1: Chronic endplate Schmorl's node deformities  without significant disc bulge. Mild right-sided facet hypertrophy. No significant stenosis.   L1-2: Interval PLIF. No residual or recurrent spinal stenosis. Foramina appear patent.   L2-3: Disc bulge with disc desiccation and intervertebral disc space narrowing. Disc bulging eccentric to the left. Reactive endplate spurring. Moderate left with mild right facet arthrosis. Resultant mild narrowing of the left lateral recess. Mild left L2 foraminal narrowing. Right neural foramen remains patent. Appearance is stable.   L3-4: Degenerative intervertebral disc space narrowing with diffuse disc bulge and disc desiccation. Disc bulging eccentric to the left with left-sided reactive endplate spurring. Resultant extraforaminal disc osteophyte contacts the exiting left L3 nerve root as it courses of the left neural foramen (series 101, image 20). Moderate left with mild right facet arthrosis. Resultant mild narrowing of the left lateral recess. Mild left L3 foraminal stenosis. Right neural foramen remains patent. Appearance is stable.   L4-5: Disc bulge with disc desiccation. Reactive endplate spurring. Mild to moderate facet hypertrophy. No more than mild narrowing of the lateral recesses bilaterally. Mild to moderate right with mild left L4 foraminal stenosis. Appearance is stable.   L5-S1: Anterolisthesis. Disc desiccation with broad posterior disc bulge. Severe right-sided facet arthrosis with reactive marrow edema. Moderate left-sided foraminal narrowing. No significant spinal stenosis. Mild right L5 foraminal stenosis. Appearance is similar.    IMPRESSION: 1. Interval PLIF at L1-2 without residual or recurrent stenosis. 2. Multilevel degenerative spondylosis elsewhere throughout the lumbar spine with resultant mild left lateral recess and foraminal stenosis at L2-3 and L3-4, and mild to moderate bilateral foraminal narrowing at L4-5 and L5-S1 as above. 3. Advanced right-sided facet arthrosis at L5-S1 with associated reactive marrow edema, which could serve as a source for lower back pain.  T-spine   R hip MRI 05/05/23 IMPRESSION: 1. Mild osteoarthritis of the hips. 2. Suspected focal tear of the anterior superior labrum with possible small defect in the adjacent articular cartilage. 3. Minimal partial tearing of the right proximal hamstring tendon with more striking partial tearing of the left proximal hamstring tendon. 4. Spurring of the pubic bones with low-level edema signal in both pubic bones, degenerative arthropathy versus less likely osteitis pubis. 5. Fluid signal intensity inferiorly along the pubic symphysis and I cannot completely exclude the possibility of a right adductor aponeurotic tear.      Assessment & Plan:   Chronic lumbar and thoracic back pain with history of prior lumbar surgery, SI joint pain -Has multi level degenerative spondylosis -Hx of  L1-L2 TLIF -UDS and pain agreement completed prior visit -Continue hydrocodone at 5 mg dose Q4h PRN, no more than 3 doses a day #90 with each refill-medication refilled today -Pt seen by Dr. Wynn Banker for left SI joint injection- helped for few days.  Orthopedics has noted some pain in her right SI joint -Consulted Dr. Wynn Banker for medial branch blocks bilateral L3-L4, L4-L5, L5-S1 she reports 80% improvement after procedure, is scheduled to follow-up with Dr. Wynn Banker for additional procedure -Pt has allergy noted to hydrocodone in chart-she insists this is not correct and she does not have this allergy -Zynex TENS and Heat/Cold therapy device ordered  prior visit -Continue gabapentin -dose limited by sedation -Physical therapy-Ana Santos reports this did not provide benefit -Continue to monitor pill count, PDMP, UDS random   R anterior hip pain -Continue Voltaren gel, continue medications as above -Ana Santos reported short-term improvement after hip injection by orthopedics   THC use history -Ana Santos reports she is not currently using -Continue random UDS  HTN- well controlled today  -Continue follow-up with PCP  Addendum pt called 09/12/23 reports neck pain doing better

## 2023-08-01 DIAGNOSIS — C672 Malignant neoplasm of lateral wall of bladder: Secondary | ICD-10-CM | POA: Diagnosis not present

## 2023-08-08 ENCOUNTER — Other Ambulatory Visit: Payer: Self-pay | Admitting: Family Medicine

## 2023-08-08 DIAGNOSIS — C672 Malignant neoplasm of lateral wall of bladder: Secondary | ICD-10-CM | POA: Diagnosis not present

## 2023-08-08 DIAGNOSIS — I10 Essential (primary) hypertension: Secondary | ICD-10-CM

## 2023-08-22 ENCOUNTER — Ambulatory Visit: Payer: Self-pay | Admitting: Family Medicine

## 2023-08-22 NOTE — Telephone Encounter (Signed)
 Called patients husband and advised that she needs to go to the ER to be evaluated due to she might be dehydrated.   He will try to talk to her. Dm/cma

## 2023-08-22 NOTE — Telephone Encounter (Signed)
 Chief Complaint: vomiting Symptoms: vomiting, weakness, leg pain, diarrhea Frequency: 5 days  Pertinent Negatives: Patient denies CP, SOB Disposition: [x] ED /[] Urgent Care (no appt availability in office) / [] Appointment(In office/virtual)/ []  [facility]/ [] Home Care/ [x] Refused Recommended Disposition /[] [facility]/ []  Follow-up with PCP Additional Notes: Pt reports vomiting for 5 days. Pt states she has not eaten in 5 days. Pt states she is unable to keep anything down, even liquids. Pt states she vomits after mostly anything she drinks. States she maybe kept some Coke down earlier today. Endorses some diarrhea as well. Denies sick contacts. Denies CP or SOB. States she is extremely weak and that her legs are aching. Pt is so weak she has difficulty standing. Per protocol RN advised pt she needs to go to the ED and offered to dial 911 for the pt. Pt declined and states she wants to see Dr. Veto Kemps instead. RN educated pt on why the ED is the best place to have her symptoms addressed. Pt states she will think about it. Pt refused 911 transport, states she is with her husband and he can take her if she decides to go. RN called the CAL to inform.   Copied from CRM [id number]. Topic: Clinical - Red Word Triage >> Aug 22, 2023  4:40 PM Sim Boast F wrote: Red Word that prompted transfer to Nurse Triage: Patient has no appetite and hasn't eaten in 5 days, every time she tries to drink water it comes back up Reason for Disposition  [1] SEVERE vomiting (e.g., 6 or more times/day) AND [2] present > 8 hours (Exception: Patient sounds well, is drinking liquids, does not sound dehydrated, and vomiting has lasted less than 24 hours.)  Answer Assessment - Initial Assessment Questions 1. VOMITING SEVERITY: "How many times have you vomited in the past 24 hours?"     - MILD:  1 - 2 times/day    - MODERATE: 3 - 5 times/day, decreased oral intake without significant weight loss or symptoms of  dehydration    - SEVERE: 6 or more times/day, vomits everything or nearly everything, with significant weight loss, symptoms of dehydration      5x/day 2. ONSET: "When did the vomiting begin?"      5 days ago 3. FLUIDS: "What fluids or food have you vomited up today?" "Have you been able to keep any fluids down?"     No food in 5 days, vomits every time she drinks liquids. States she might have kept down some Coke. 4. ABDOMEN PAIN: "Are your having any abdomen pain?" If Yes : "How bad is it and what does it feel like?" (e.g., crampy, dull, intermittent, constant)      No 5. DIARRHEA: "Is there any diarrhea?" If Yes, ask: "How many times today?"      "Every once in a while" 6. CONTACTS: "Is there anyone else in the family with the same symptoms?"      No 7. CAUSE: "What do you think is causing your vomiting?"     Not sure  8. HYDRATION STATUS: "Any signs of dehydration?" (e.g., dry mouth [not only dry lips], too weak to stand) "When did you last urinate?"     Weakness, difficulty standing, pain in both legs, unable to keep water down, able to urinate normally, not eaten in 5 days, no abd pain 9. OTHER SYMPTOMS: "Do you have any other symptoms?" (e.g., fever, headache, vertigo, vomiting blood or coffee grounds, recent head injury)  No fever  Protocols used: Vomiting-A-AH

## 2023-08-23 NOTE — Telephone Encounter (Signed)
Left VM to rtn call. Dm/cma       

## 2023-08-24 NOTE — Telephone Encounter (Signed)
 Called patient and spoke to her about going to the ER to be checked out.   She is still unable to eat much and is drinking water but unable to keep it down and it is going on 7 days.  Advised to please go and be seen.  She states if she doesn't get better she will. Dm/cma

## 2023-08-27 ENCOUNTER — Other Ambulatory Visit: Payer: Self-pay | Admitting: Physical Medicine & Rehabilitation

## 2023-08-27 ENCOUNTER — Telehealth: Payer: Self-pay | Admitting: Physical Medicine & Rehabilitation

## 2023-08-27 MED ORDER — HYDROCODONE-ACETAMINOPHEN 5-325 MG PO TABS: 1.0000 | ORAL_TABLET | Freq: Three times a day (TID) | ORAL | 0 refills | Status: DC | PRN

## 2023-08-27 NOTE — Telephone Encounter (Signed)
 Requesting refill on pain medication. Please send to [facility]

## 2023-09-03 ENCOUNTER — Other Ambulatory Visit: Payer: Self-pay | Admitting: Family Medicine

## 2023-09-03 DIAGNOSIS — E782 Mixed hyperlipidemia: Secondary | ICD-10-CM

## 2023-09-07 ENCOUNTER — Telehealth: Payer: Self-pay

## 2023-09-07 NOTE — Telephone Encounter (Signed)
 Back pain is worse going into neck and shoulders. Can't turn head and can't get comfortable. Pleas advise.

## 2023-09-20 ENCOUNTER — Ambulatory Visit (INDEPENDENT_AMBULATORY_CARE_PROVIDER_SITE_OTHER): Admitting: Orthopedic Surgery

## 2023-09-20 ENCOUNTER — Other Ambulatory Visit (INDEPENDENT_AMBULATORY_CARE_PROVIDER_SITE_OTHER): Payer: Self-pay

## 2023-09-20 DIAGNOSIS — M545 Low back pain, unspecified: Secondary | ICD-10-CM

## 2023-09-20 NOTE — Progress Notes (Signed)
 Orthopedic Spine Surgery Office Note 2   Assessment: Patient is a 83 y.o. female status post L1/2 TLIF and PSIF for recurrent disc herniation.  Persistent back pain after surgery     Plan: -Patient has tried physical therapy, home exercises, NSAIDs, Tylenol, steroid injections, patches, creams, operative intervention, pain management, medrol dosepak, right hip injection, lumbar bracing -Continue with pain management -Patient has had continued pain after surgery.  She has been trying treatments for over a year now, so ordered a CT scan to evaluate for pseudoarthrosis and to look for facet arthropathy as potential etiologies of her pain -Patient should return to the office in 4 weeks, x-rays at next visit: None     Patient expressed understanding of the plan and all questions were answered to the patient's satisfaction.    ___________________________________________________________________________   History: Patient is a 83 y.o. female with history of chronic low back pain who comes in today with for continued back pain.  She feels that in the upper lumbar region.  She said more recently it started to radiate into the thoracic spine.  She feels that the full length of the thoracic spine once that is worse.  The majority of her pain is still localized to the upper lumbar region.  She does not have any pain radiating to either lower extremity.  She has not noticed any weakness in her legs. Has a history of urinary incontinence. No recent changes in bowel or bladder habits. No saddle anesthesia.     Physical Exam:   General: no acute distress, appears stated age Neurologic: alert, answering questions appropriately, following commands Respiratory: unlabored breathing on room air, symmetric chest rise Psychiatric: appropriate affect, normal cadence to speech     MSK (spine):   -Strength exam                                                   Left                  Right EHL                               4/5                  4/5 TA                                 4/5                  5/5 GSC                             4/5                  5/5 Knee extension            5/5                  5/5 Hip flexion                    5/5                  4/5   -Sensory exam  Sensation intact to light touch in L2-S1 nerve distributions of bilateral lower extremities   -Negative L'Hermitte sign -Negative hoffman bilaterally -Negative grip and release test -No beats of clonus bilaterally    Imaging: XRs of the lumbar spine from 09/20/2023 were previously independently reviewed and interpreted, showing L1/2 to interbody device with posterior instrumentation. No lucency seen around the screws or interbody device. No evidence of instability on flexion/extension views. No fracture or dislocation seen. Disc height loss at L2/3 and L3/4.      Patient name: Ana Santos Patient MRN: [id number] Date of visit: 09/20/23

## 2023-09-21 ENCOUNTER — Encounter: Payer: Medicare Other | Attending: Physical Medicine & Rehabilitation | Admitting: Physical Medicine & Rehabilitation

## 2023-09-21 ENCOUNTER — Encounter: Payer: Self-pay | Admitting: Physical Medicine & Rehabilitation

## 2023-09-21 VITALS — BP 138/76 | HR 65 | Ht 62.0 in | Wt 121.0 lb

## 2023-09-21 DIAGNOSIS — G894 Chronic pain syndrome: Secondary | ICD-10-CM | POA: Insufficient documentation

## 2023-09-21 DIAGNOSIS — Z5181 Encounter for therapeutic drug level monitoring: Secondary | ICD-10-CM | POA: Insufficient documentation

## 2023-09-21 DIAGNOSIS — Z79891 Long term (current) use of opiate analgesic: Secondary | ICD-10-CM | POA: Diagnosis not present

## 2023-09-21 DIAGNOSIS — M47817 Spondylosis without myelopathy or radiculopathy, lumbosacral region: Secondary | ICD-10-CM | POA: Diagnosis not present

## 2023-09-21 DIAGNOSIS — M542 Cervicalgia: Secondary | ICD-10-CM | POA: Insufficient documentation

## 2023-09-21 MED ORDER — HYDROCODONE-ACETAMINOPHEN 5-325 MG PO TABS
1.0000 | ORAL_TABLET | Freq: Three times a day (TID) | ORAL | 0 refills | Status: DC | PRN
Start: 2023-09-21 — End: 2023-11-20

## 2023-09-21 NOTE — Progress Notes (Addendum)
 Subjective:    Patient ID: Ana Santos, female    DOB: 1941-03-31, 83 y.o.   MRN: [id number]  HPI    HPI Ana Santos is a 83 y.o. year old female  who  has a past medical history of AN (acoustic neuroma) (HCC), Aneurysm (HCC), Anxiety, Bladder cancer (HCC), Cataract, Cigarette smoker, COPD (chronic obstructive pulmonary disease) (HCC), Coronary artery disease, Depression, Headache, Hyperlipidemia, Hypertension, Hypothyroidism, IBS (irritable bowel syndrome), Mitral valve prolapse, Osteopenia, Stroke (HCC), Venous insufficiency, and Vitamin D  deficiency.   They are presenting to PM&R clinic as a new patient for pain management evaluation. They were referred for treatment of chronic lumbar and thoracic back pain.   She reports that she has had worsening pain for about 4 years.  No consistent pain shooting down her legs.  Pain is stabbing in quality.  Lifting anything makes it worse.  Denies any numbness or paresthesias in her limbs.  She had a prior left L1-2 hemilaminectomy and then later a L1-L2 TLIF in March 2023.  She is previously followed by Dr. Richardo Chandler.  She is now being seen by Dr. Colette Davies orthopedic spine surgery.  She reports a history of bowel surgery with small intestine removed previously.   Red flag symptoms: Patient denies saddle anesthesia, loss of bowel or bladder continence, new weakness, new numbness/tingling, or pain waking up at nighttime.   Medications tried: Tylenol , advil, lidcaine patch, icy hot with mild benefit She is currently on Plavix  which limits her medication options Hydrocodone  10 mg previously helped control her pain  tramadol -this did help her pain however she had very severe withdrawal issues when she did not get her medication on time, would like to avoid this medication if possible Oxycodone  helped her pain previously   Other treatments: PT/OT  made it worse Heat pad helps 10-15 ESI injections- reports minimal benefit  L1-2 hemilaminectomy  and then later a L1-L2 TLIF in March 2023.  Without sustained improvement   Interval history 06/27/2022 Ana Santos is here for follow-up regarding her chronic pain.  She continues to have lumbar and thoracic back pain.  She says the pain has been very severe since medications have been discontinued.  She did get some tramadol  at the end of December which provided mild benefit to her pain.  She denies any use of other substances or marijuana since her last visit.  She says she regrets having her back surgery done last year.   Interval history 08/29/2022 Ana Santos is here for follow-up regarding her chronic back pain primarily in her lumbar and thoracic back.  She also recently had a left SI joint injection by Dr. Sharl Davies.  This provided short-term pain relief however the pain returned after a day or two.  Hydrocodone  5 mg has been helping her pain and makes it more tolerable.  She is not having any side effects of this medication.  She does report that hydrocodone  does not last long enough and this leaves her in significant pain throughout the day.     Interval history 10/10/22 Ana Santos is here for follow-up regarding her chronic pain.  Pill counts appear to be consistent with her hydrocodone .  She is trying only uses medication when the pain is very severe.  No side effects with the hydrocodone  reported.  The hydrocodone  is helping her lower back pain remains tolerable.  She is also had some discomfort around her right inguinal area/anterior hip.  She says this started after doing some work in  the yard.  She has occasional use heating pads for her lower back and found this to be helpful.  She has not tried TENS before.     Interval History 12/07/22 Patient is here with her husband for follow-up regarding her chronic pain.  She continues to have severe lower back and thoracic back pain.  Back pain will radiate into her thighs.  She denies any recent changes in strength or sensation.   Hydrocodone  5 mg continues to help keep her pain controlled, she usually takes 1 in the morning, and will take another tablet later in the day if needed.  She has not had any recent falls.  No side effects with her medications.  Patient reports she is able to be more active when using the hydrocodone  and is able to get done more around her house.       Interval History 01/25/23 Patient is here with her husband following up regarding treatment for her chronic pain.  She continues to have lower back pain with some pain in her mid back and thighs right greater than left.  She also has pain shooting down her right leg.  Patient reports that hydrocodone  is helping keep her pain more tolerable.  She is not having any side effects with hydrocodone .  She tried physical therapy but says it made her pain worse.  She has not tried gabapentin , she says she did not know this was available.  Interval History 03/29/23 Ana Santos is here to follow-up on her chronic pain.  Patient reports she had about 80% improvement with bilateral lumbar L2, L3, L4 medial branch blocks with L5 dorsal ramus injection under for scopic guidance.  She also had a hip injection by orthopedics under ultrasound guidance with improvement in her right hip pain for couple weeks.  Hip pain has been returning.  She continues to use hydrocodone  when her pain is severe, no side effects with this medication reported.  Hydrocodone  allows her to be more active and get out of the house more.  She reports she has been treated for bladder cancer.  Interval History 05/24/23 Patient is here for follow-up regarding her chronic back and hip pain.  Patient was seen by Dr. Christiane Cowing for DJD right hip, recommend total hip replacement.  She plans to hold off for now but may consider at a later time.  She had good results with L2-3-4 5 MBB with Dr. Sharl Davies, has follow-up visit scheduled.  She reports in the meantime Norco 5 is keeping her pain at a tolerable level and  this allows her to be more active.  No significant side effects with the medication.  Interval history 07/29/22 Patient is here for follow-up for her chronic back and hip pain.  Patient had right L5 dorsal ramus, right L4, right L3 and L2 medial branch radiofrequency neurotomy under fluoroscopic guidance by Dr. Sharl Davies completed on 07/13/2023.  She reports she initially had severe pain in her right lower back after this procedure.  However after initial pain wore off she noticed a significant reduction over 50% of her right lower back pain.  She reports she can now lift her right leg a lot better than she was able to do previously.  Hydrocodone  continues to help her lower back pain, she tries to only use this when pain is severe.  Sometimes she will use 1/2 tab when the pain is more moderate.  No side effects with the medication.  Interval history 09/21/23 Ana Santos is here for follow-up  of her chronic low back pain.  Reports pain in her right lower back continues to be improved but she continues to have pain in her left lower back.  Patient says she thought today's visit was for the injection.  She had worsening episode of neck pain after doing some increased work in the yard, since this time has improved.  Hydrocodone  continues to keep her pain under control, no side effects with medication.  Pain Inventory Average Pain 10 Pain Right Now 6 My pain is constant, sharp, burning, and aching  In the last 24 hours, has pain interfered with the following? General activity 7 Relation with others 5 Enjoyment of life 8 What TIME of day is your pain at its worst? morning  and night Sleep (in general) Poor  Pain is worse with: walking, bending, and some activites Pain improves with: heat, medication Relief from Meds: 6  Family History  Problem Relation Age of Onset   Heart disease Mother    Cancer Mother        Lung   Heart attack Mother    Diabetes Mother    Cancer Father        Lung    Diabetes Sister    Cancer Sister        unkown type   Diabetes Brother    Lung cancer Brother        Lung   AAA (abdominal aortic aneurysm) Neg Hx    Colon cancer Neg Hx    Stomach cancer Neg Hx    Esophageal cancer Neg Hx    Liver cancer Neg Hx    Pancreatic cancer Neg Hx    Rectal cancer Neg Hx    Social History   Socioeconomic History   Marital status: Married    Spouse name: Currie Douse   Number of children: 5   Years of education: Not on file   Highest education level: Not on file  Occupational History   Occupation: alterations   Occupation: Retired    Comment: Neurosurgeon  Tobacco Use   Smoking status: Every Day    Current packs/day: 0.25    Average packs/day: 0.3 packs/day for 50.0 years (12.5 ttl pk-yrs)    Types: Cigarettes   Smokeless tobacco: Never   Tobacco comments:    4-5 cigs daily  is aware she needs to quit  Vaping Use   Vaping status: Never Used  Substance and Sexual Activity   Alcohol  use: Never   Drug use: Never   Sexual activity: Not Currently  Other Topics Concern   Not on file  Social History Narrative   Pt is married, 5 sons (4 living), 14 grandchildren, 4 great grandchildren.   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   Social Drivers of Corporate investment banker Strain: Low Risk  (07/31/2022)   Overall Financial Resource Strain (CARDIA)    Difficulty of Paying Living Expenses: Not hard at all  Food Insecurity: No Food Insecurity (07/31/2022)   Hunger Vital Sign    Worried About Running Out of Food in the Last Year: Never true    Ran Out of Food in the Last Year: Never true  Transportation Needs: No Transportation Needs (07/31/2022)   PRAPARE - Administrator, Civil Service (Medical): No    Lack of Transportation (Non-Medical): No  Physical Activity: Inactive (07/31/2022)   Exercise Vital Sign    Days of Exercise per Week: 0 days    Minutes  of Exercise per Session: 0 min  Stress: No Stress  Concern Present (07/31/2022)   [employer] of Occupational Health - Occupational Stress Questionnaire    Feeling of Stress : Not at all  Social Connections: Moderately Isolated (01/29/2020)   Social Connection and Isolation Panel [NHANES]    Frequency of Communication with Friends and Family: More than three times a week    Frequency of Social Gatherings with Friends and Family: Once a week    Attends Religious Services: Never    Database administrator or Organizations: No    Attends Banker Meetings: Never    Marital Status: Married   Past Surgical History:  Procedure Laterality Date   ANEURYSM COILING  04/2009   Dr. Alvira Josephs   APPENDECTOMY     BACK SURGERY  02/28/2021   BACK SURGERY     08/2021   BREAST LUMPECTOMY Right 1986   benign   CYSTOSCOPY W/ RETROGRADES Bilateral 01/19/2023   Procedure: BILATERAL RETROGRADE PYELOGRAM;  Surgeon: Christina Coyer, MD;  Location: [facility];  Service: Urology;  Laterality: Bilateral;   CYSTOSCOPY WITH BIOPSY N/A 01/19/2023   Procedure: CYSTOSCOPY WITH BLADDER BIOPSY POST OP GEMCITABINE ;  Surgeon: Christina Coyer, MD;  Location: [facility];  Service: Urology;  Laterality: N/A;   CYSTOSCOPY WITH FULGERATION N/A 01/19/2023   Procedure: CYSTOSCOPY WITH FULGERATION;  Surgeon: Christina Coyer, MD;  Location: [facility];  Service: Urology;  Laterality: N/A;  75 MINS FOR CASE   DIAGNOSTIC LAPAROSCOPY     Proable laparoscopic right colectomy 03-14-18 Dr. Alray Askew   EYE SURGERY Bilateral    ioc lens for cataracts   LAPAROSCOPIC RIGHT COLECTOMY Right 03/14/2018   Procedure: LAPAROSCOPIC ASSISTED  RIGHT COLECTOMY;  Surgeon: Ayesha Lente, MD;  Location: [facility];  Service: General;  Laterality: Right;   LAPAROSCOPY N/A 03/14/2018   Procedure: LAPAROSCOPY;  Surgeon: Ayesha Lente, MD;  Location: [facility];  Service: General;  Laterality: N/A;   LEFT HEART CATHETERIZATION WITH CORONARY ANGIOGRAM N/A 08/05/2014   Procedure: LEFT HEART  CATHETERIZATION WITH CORONARY ANGIOGRAM;  Surgeon: Wenona Hamilton, MD;  Location: [facility];  Service: Cardiovascular;  Laterality: N/A;   LUMBAR LAMINECTOMY N/A 02/28/2021   Procedure: LEFT L1-L2 MICRODISCECTOMY AND FASCIECTOMY;  Surgeon: Adah Acron, MD;  Location: [facility];  Service: Orthopedics;  Laterality: N/A;   TONSILLECTOMY     TOTAL ABDOMINAL HYSTERECTOMY  1970   complete   TRANSLABYRINTHINE PROCEDURE  2002   [facility] Dr. Carlette Cheers tumor removal   TRANSURETHRAL RESECTION OF BLADDER TUMOR N/A 02/08/2018   Procedure: TRANSURETHRAL RESECTION OF BLADDER TUMOR (TURBT)/ POSTOPERATIVE INSTILLATION OF CHEMO THERAPY;  Surgeon: Christina Coyer, MD;  Location: [facility];  Service: Urology;  Laterality: N/A;   Past Surgical History:  Procedure Laterality Date   ANEURYSM COILING  04/2009   Dr. Alvira Josephs   APPENDECTOMY     BACK SURGERY  02/28/2021   BACK SURGERY     08/2021   BREAST LUMPECTOMY Right 1986   benign   CYSTOSCOPY W/ RETROGRADES Bilateral 01/19/2023   Procedure: BILATERAL RETROGRADE PYELOGRAM;  Surgeon: Christina Coyer, MD;  Location: [facility];  Service: Urology;  Laterality: Bilateral;   CYSTOSCOPY WITH BIOPSY N/A 01/19/2023   Procedure: CYSTOSCOPY WITH BLADDER BIOPSY POST OP GEMCITABINE ;  Surgeon: Christina Coyer, MD;  Location: [facility];  Service: Urology;  Laterality: N/A;   CYSTOSCOPY WITH FULGERATION N/A 01/19/2023   Procedure: CYSTOSCOPY WITH FULGERATION;  Surgeon: Christina Coyer, MD;  Location: [facility];  Service:  Urology;  Laterality: N/A;  75 MINS FOR CASE   DIAGNOSTIC LAPAROSCOPY     Proable laparoscopic right colectomy 03-14-18 Dr. Alray Askew   EYE SURGERY Bilateral    ioc lens for cataracts   LAPAROSCOPIC RIGHT COLECTOMY Right 03/14/2018   Procedure: LAPAROSCOPIC ASSISTED  RIGHT COLECTOMY;  Surgeon: Ayesha Lente, MD;  Location: [facility];  Service: General;  Laterality: Right;   LAPAROSCOPY N/A 03/14/2018   Procedure: LAPAROSCOPY;  Surgeon: Ayesha Lente, MD;  Location: [facility];  Service: General;  Laterality: N/A;   LEFT HEART CATHETERIZATION WITH CORONARY ANGIOGRAM N/A 08/05/2014   Procedure: LEFT HEART CATHETERIZATION WITH CORONARY ANGIOGRAM;  Surgeon: Wenona Hamilton, MD;  Location: [facility];  Service: Cardiovascular;  Laterality: N/A;   LUMBAR LAMINECTOMY N/A 02/28/2021   Procedure: LEFT L1-L2 MICRODISCECTOMY AND FASCIECTOMY;  Surgeon: Adah Acron, MD;  Location: [facility];  Service: Orthopedics;  Laterality: N/A;   TONSILLECTOMY     TOTAL ABDOMINAL HYSTERECTOMY  1970   complete   TRANSLABYRINTHINE PROCEDURE  2002   [facility] Dr. 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 04/26/09   Anxiety    Arthritis    Bladder cancer (HCC)    Cataract    Cigarette smoker    COPD (chronic obstructive pulmonary disease) (HCC)    mild, no inhalers or oxygen used   Depression    History of kidney stones    Hyperlipidemia    Hypertension    Hypothyroidism    IBS (irritable bowel syndrome)    Mitral valve prolapse    mild takes beta blocker   Osteopenia    Stroke (HCC)    x 3 ministrokes last one feb 2014   Venous insufficiency    Vitamin D  deficiency    BP 138/76   Pulse 65   Ht 5\' 2"  (1.575 m)   Wt 121 lb (54.9 kg)   SpO2 98%   BMI 22.13 kg/m   Opioid Risk Score:  Fall Risk Score:  `1  Depression screen [facility] 2/9     06/11/2023    8:03 AM 06/08/2023   10:01 AM 05/24/2023    9:20 AM 04/17/2023   10:40 AM 03/29/2023    9:10 AM 01/25/2023    9:28 AM 12/08/2022    7:55 AM  Depression screen PHQ 2/9  Decreased  Interest 1 0 0 2 1 1 3   Down, Depressed, Hopeless 3 0 0 2 1 1 3   PHQ - 2 Score 4 0 0 4 2 2 6   Altered sleeping 3      3  Tired, decreased energy 3      3  Change in appetite 3      0  Feeling bad or failure about yourself  0      3  Trouble concentrating 0      0  Moving slowly or fidgety/restless 1      0  Suicidal thoughts 0      0  PHQ-9 Score 14      15  Difficult doing work/chores Somewhat difficult  Very difficult    Review of Systems  Musculoskeletal:  Positive for back pain and neck pain.       Pain in right upper leg, right groin   All other systems reviewed and are negative.      Objective:   Physical Exam     07/30/2023   10:51 AM 07/24/2023    8:03 AM 07/13/2023    9:48 AM  Vitals with BMI  Height 5\' 2"  5\' 2"  5\' 2"   Weight 130 lbs 130 lbs 129 lbs  BMI 23.77 23.77 23.59  Systolic 138 120 161  Diastolic 76 64 83  Pulse 60 70 67     Physical Exam Gen: no distress, normal appearing HEENT: oral mucosa pink and moist, NCAT Cardio: Reg rate Chest: normal effort, normal rate of breathing Abd: soft, non-distended Ext: no edema Psych: Very pleasant Skin: intact Neuro: Alert and awake, follows commands, cranial nerves II through XII grossly intact, speech and language normal Sensation to light touch in all 4 extremities Strength RLE 5/5 LLE, Hip flexion 4+/5,knee extension 4/4, ankle PF and DF 4/5  No ankle clonus Musculoskeletal:  Minimal TTP C-spine paraspinal muscles SLR negative Left L spine TTP  Facet loading positive  Prior exam  Minimal pain with right hip flexion today-improved Mild pain with right hip internal and external rotation Mild TTP at right greater trochanter Mildly antalgic gait   MRI T spine 05/01/22 FINDINGS: Alignment:  Physiologic.   Vertebrae: No acute fracture, evidence of discitis, or aggressive bone lesion. T7 vertebral body hemangioma noted.   Cord:  Normal signal and morphology.   Paraspinal and other soft  tissues: No acute paraspinal abnormality.   Disc levels:   Disc spaces: Degenerative disease with disc height loss at T10-11 and T11-12. Degenerative disease with disc height loss at T1-2. Partially visualized on the scout images is degenerative disease with disc height loss at C3-4, C4-5 and C5-6.   T1-T2: Mild broad-based disc bulge. Severe right and moderate left foraminal stenosis. No spinal stenosis.   T2-T3: No disc protrusion, foraminal stenosis or central canal stenosis.   T3-T4: No disc protrusion, foraminal stenosis or central canal stenosis.   T4-T5: No disc protrusion, foraminal stenosis or central canal stenosis.   T5-T6: No disc protrusion, foraminal stenosis or central canal stenosis.   T6-T7: No disc protrusion, foraminal stenosis or central canal stenosis.   T7-T8: No disc protrusion, foraminal stenosis or central canal stenosis.   T8-T9: No disc protrusion, foraminal stenosis or central canal stenosis.   T9-T10: No disc protrusion, foraminal stenosis or central canal stenosis.   T10-T11: Mild broad-based disc bulge. No foraminal or central canal stenosis. Right perineural cyst noted.   T11-T12: No disc protrusion, foraminal stenosis or central canal stenosis.   IMPRESSION: 1. At T1-2 there is a mild broad-based disc bulge. Severe right and moderate left foraminal stenosis. 2. No acute osseous injury of the thoracic spine.   L spine MRI 12/10/21 FINDINGS: Segmentation: Standard. Lowest well-formed disc space labeled the L5-S1 level.   Alignment: Up to 4 mm anterolisthesis of L5 on S1, stable. Trace anterolisthesis of L3 on L4, also relatively stable. Straightening of the normal lumbar lordosis elsewhere. Underlying sigmoid scoliosis.   Vertebrae: Susceptibility artifact from interval PLIF at L1-2. Vertebral body height maintained without acute or chronic fracture. Bone marrow signal intensity within normal limits. Few small  benign hemangiomata noted. No worrisome osseous lesions. Mild reactive marrow edema noted about the partially visualized T10-11 interspace. Reactive edema  present about the right L5-S1 facet due to facet arthritis.   Conus medullaris and cauda equina: Conus extends to the L1-2 level. Conus and cauda equina appear normal.   Paraspinal and other soft tissues: Postoperative changes within the posterior paraspinous soft tissues without adverse features. Few scattered cysts noted about the partially visualized kidneys, similar to prior, likely benign. No follow-up imaging recommended.   Disc levels:   T11-12: Seen only on sagittal projection. Mild disc bulge with reactive endplate spurring. Left worse than right facet hypertrophy. No significant spinal stenosis. Foramina appear patent.   T12-L1: Chronic endplate Schmorl's node deformities without significant disc bulge. Mild right-sided facet hypertrophy. No significant stenosis.   L1-2: Interval PLIF. No residual or recurrent spinal stenosis. Foramina appear patent.   L2-3: Disc bulge with disc desiccation and intervertebral disc space narrowing. Disc bulging eccentric to the left. Reactive endplate spurring. Moderate left with mild right facet arthrosis. Resultant mild narrowing of the left lateral recess. Mild left L2 foraminal narrowing. Right neural foramen remains patent. Appearance is stable.   L3-4: Degenerative intervertebral disc space narrowing with diffuse disc bulge and disc desiccation. Disc bulging eccentric to the left with left-sided reactive endplate spurring. Resultant extraforaminal disc osteophyte contacts the exiting left L3 nerve root as it courses of the left neural foramen (series 101, image 20). Moderate left with mild right facet arthrosis. Resultant mild narrowing of the left lateral recess. Mild left L3 foraminal stenosis. Right neural foramen remains patent. Appearance is stable.   L4-5: Disc bulge  with disc desiccation. Reactive endplate spurring. Mild to moderate facet hypertrophy. No more than mild narrowing of the lateral recesses bilaterally. Mild to moderate right with mild left L4 foraminal stenosis. Appearance is stable.   L5-S1: Anterolisthesis. Disc desiccation with broad posterior disc bulge. Severe right-sided facet arthrosis with reactive marrow edema. Moderate left-sided foraminal narrowing. No significant spinal stenosis. Mild right L5 foraminal stenosis. Appearance is similar.   IMPRESSION: 1. Interval PLIF at L1-2 without residual or recurrent stenosis. 2. Multilevel degenerative spondylosis elsewhere throughout the lumbar spine with resultant mild left lateral recess and foraminal stenosis at L2-3 and L3-4, and mild to moderate bilateral foraminal narrowing at L4-5 and L5-S1 as above. 3. Advanced right-sided facet arthrosis at L5-S1 with associated reactive marrow edema, which could serve as a source for lower back pain.  T-spine   R hip MRI 05/05/23 IMPRESSION: 1. Mild osteoarthritis of the hips. 2. Suspected focal tear of the anterior superior labrum with possible small defect in the adjacent articular cartilage. 3. Minimal partial tearing of the right proximal hamstring tendon with more striking partial tearing of the left proximal hamstring tendon. 4. Spurring of the pubic bones with low-level edema signal in both pubic bones, degenerative arthropathy versus less likely osteitis pubis. 5. Fluid signal intensity inferiorly along the pubic symphysis and I cannot completely exclude the possibility of a right adductor aponeurotic tear.      Assessment & Plan:   Chronic lumbar and thoracic back pain with history of prior lumbar surgery, SI joint pain -Has multi level degenerative spondylosis -Hx of  L1-L2 TLIF -UDS and pain agreement completed prior visit -Continue hydrocodone  at 5 mg dose Q4h PRN, no more than 3 doses a day #90 with each  refill-medication refilled today -Pt seen by Dr. Sharl Davies for left SI joint injection- helped for few days.  Orthopedics has noted some pain in her right SI joint -Right Lower back pain improved with Right L5 dorsal ramus., Right L4,  Right L3 and L2 medial branch radio frequency neurotomy, with refer to Dr. Sharl Davies for the left side  -Pt has allergy noted to hydrocodone  in chart-she insists this is not correct and she does not have this allergy -Zynex TENS and Heat/Cold therapy device ordered prior visit -Continue gabapentin  -dose limited by sedation -Physical therapy-patient reports this did not provide benefit -Continue to monitor pill count, PDMP, UDS random   R anterior hip pain -Continue Voltaren  gel, continue medications as above -Patient reported short-term improvement after hip injection by orthopedics   THC use history -Patient reports she is not currently using -Continue random UDS   HTN- well controlled today  -Continue follow-up with PCP  Neck pain -Improved   Addendum 10/03/23, low level HTC noted, attempted to call- no answer.  left discrete VM for call back, will ask for written warning letter to be sent. With low level could be CBD product.

## 2023-09-24 LAB — TOXASSURE SELECT,+ANTIDEPR,UR

## 2023-09-25 ENCOUNTER — Ambulatory Visit (HOSPITAL_BASED_OUTPATIENT_CLINIC_OR_DEPARTMENT_OTHER)
Admission: RE | Admit: 2023-09-25 | Discharge: 2023-09-25 | Disposition: A | Discharge status: Home / Self Care | Source: Ambulatory Visit | Attending: Orthopedic Surgery | Admitting: Orthopedic Surgery

## 2023-09-25 DIAGNOSIS — M47817 Spondylosis without myelopathy or radiculopathy, lumbosacral region: Secondary | ICD-10-CM | POA: Diagnosis not present

## 2023-09-25 DIAGNOSIS — R262 Difficulty in walking, not elsewhere classified: Secondary | ICD-10-CM

## 2023-09-25 DIAGNOSIS — M545 Low back pain, unspecified: Secondary | ICD-10-CM | POA: Diagnosis not present

## 2023-09-25 DIAGNOSIS — M48061 Spinal stenosis, lumbar region without neurogenic claudication: Secondary | ICD-10-CM | POA: Diagnosis not present

## 2023-09-25 DIAGNOSIS — M4317 Spondylolisthesis, lumbosacral region: Secondary | ICD-10-CM | POA: Diagnosis not present

## 2023-09-25 DIAGNOSIS — M5136 Other intervertebral disc degeneration, lumbar region with discogenic back pain only: Secondary | ICD-10-CM | POA: Diagnosis not present

## 2023-09-27 ENCOUNTER — Ambulatory Visit: Payer: Medicare Other | Admitting: Physical Medicine & Rehabilitation

## 2023-09-30 ENCOUNTER — Other Ambulatory Visit: Payer: Self-pay | Admitting: Family Medicine

## 2023-09-30 DIAGNOSIS — F418 Other specified anxiety disorders: Secondary | ICD-10-CM

## 2023-10-04 ENCOUNTER — Telehealth: Payer: Self-pay | Admitting: Physical Medicine & Rehabilitation

## 2023-10-04 NOTE — Telephone Encounter (Signed)
Patient returning Dr. Alyson Reedy call.

## 2023-10-05 ENCOUNTER — Ambulatory Visit (INDEPENDENT_AMBULATORY_CARE_PROVIDER_SITE_OTHER): Admitting: Orthopedic Surgery

## 2023-10-05 DIAGNOSIS — M545 Low back pain, unspecified: Secondary | ICD-10-CM | POA: Diagnosis not present

## 2023-10-05 NOTE — Progress Notes (Signed)
 Orthopedic Spine Surgery Office Note 2   Assessment: Patient is a 83 y.o. female status post L1/2 TLIF and PSIF for recurrent disc herniation.  Persistent back pain after surgery. CT shows subtle lucency around the L2 screws but there are at least 2 slices with bridging bone across the former disc space     Plan: -Patient has tried physical therapy, home exercises, NSAIDs, Tylenol , steroid injections, patches, creams, operative intervention, pain management, medrol  dosepak, right hip injection, lumbar bracing -The CT does not show convincing evidence of pseudarthrosis. I told her I do not see 3 slices with bridging bone so we could do a pseudarthrosis revision surgery but I am not sure that would provide her with the relief she is seeking -I placed a referral to Dr. Ona Bidding office to see if she would be a candidate for spinal cord stimulator. If she is, she may do better with that. If she is not a good candidate, then we could do a a nonunion repair. In that event, I would have to spend some time setting expectations since I am not convinced that would give her much relief -Continue with pain management -If patient is not a good candidate for a stimulator, then would see her back to go over possible nonunion repair further. If she is a good candidate, then she should proceed with that     Patient expressed understanding of the plan and all questions were answered to the patient's satisfaction.    ___________________________________________________________________________   History: Patient is a 83 y.o. female with history of chronic low back pain who comes in today to review her CT scan. Her symptoms are unchanged. Still having significant back pain that is severe in nature. No radiating leg pain. Has not developed any new symptoms since she was last seen in the office.      Physical Exam:   General: no acute distress, appears stated age Neurologic: alert, answering questions appropriately,  following commands Respiratory: unlabored breathing on room air, symmetric chest rise Psychiatric: appropriate affect, normal cadence to speech     MSK (spine):   -Strength exam                                                   Left                  Right EHL                              4/5                  4/5 TA                                 4/5                  5/5 GSC                             4/5                  5/5 Knee extension            5/5  5/5 Hip flexion                    5/5                  4/5   -Sensory exam                           Sensation intact to light touch in L2-S1 nerve distributions of bilateral lower extremities     Imaging: XRs of the lumbar spine from 09/20/2023 were previously previously independently reviewed and interpreted, showing L1/2 to interbody device with posterior instrumentation. No lucency seen around the screws or interbody device. No evidence of instability on flexion/extension views. No fracture or dislocation seen. Disc height loss at L2/3 and L3/4.   CT of the lumbar spine from 09/25/2023 was independently reviewed and interpreted showing satisfactory position of the L1 and L2 screws. Subtle lucency around the L2 screws. No lucency seen around the L1 screws. Interbody device in the former L1/2 disc space appears in appropriate position. No lucency around the interbody device. The left L1/2 facet has been removed. There is no bony fusion mass across the right L1/2 facet. There are 2 slices that I can see bridging bone mass in the former disc space, but there are not 3 consecutive slices with spanning fusion mass. No fracture or dislocation seen.      Patient name: Ana Santos Patient MRN: [id number] Date of visit: 10/05/23

## 2023-10-08 ENCOUNTER — Encounter: Payer: Self-pay | Admitting: *Deleted

## 2023-10-09 ENCOUNTER — Telehealth: Payer: Self-pay

## 2023-10-09 NOTE — Telephone Encounter (Signed)
Patient notified of refill sent to pharmacy.

## 2023-10-11 ENCOUNTER — Ambulatory Visit (INDEPENDENT_AMBULATORY_CARE_PROVIDER_SITE_OTHER): Admitting: Physical Medicine and Rehabilitation

## 2023-10-11 ENCOUNTER — Encounter: Payer: Self-pay | Admitting: Physical Medicine and Rehabilitation

## 2023-10-11 DIAGNOSIS — M961 Postlaminectomy syndrome, not elsewhere classified: Secondary | ICD-10-CM | POA: Diagnosis not present

## 2023-10-11 DIAGNOSIS — M5416 Radiculopathy, lumbar region: Secondary | ICD-10-CM | POA: Diagnosis not present

## 2023-10-11 DIAGNOSIS — G8929 Other chronic pain: Secondary | ICD-10-CM

## 2023-10-11 DIAGNOSIS — M5442 Lumbago with sciatica, left side: Secondary | ICD-10-CM

## 2023-10-11 DIAGNOSIS — G894 Chronic pain syndrome: Secondary | ICD-10-CM | POA: Diagnosis not present

## 2023-10-11 DIAGNOSIS — M5441 Lumbago with sciatica, right side: Secondary | ICD-10-CM | POA: Diagnosis not present

## 2023-10-11 NOTE — Progress Notes (Unsigned)
 Ana Santos - 83 y.o. female MRN [id number]  Date of birth: 05/25/1941  Office Visit Note: Visit Date: 10/11/2023 PCP: Graig Lawyer, MD Referred by: Graig Lawyer, MD  Subjective: Chief Complaint  Patient presents with   Spine - Pain   HPI: Ana Santos is a 83 y.o. female who comes in today per the request of Dr. Colette Davies for evaluation of chronic, worsening and severe bilateral lower back pain radiating to both groin regions and down both legs. Pain ongoing for several years. Patient is well known to us . Her pain is severe with activity and walking. She describes her pain as tight and squeezing sensation, currently rates as 10 out of 10. She has tried multiple conservative therapies such as formal physical therapy, home exercise regimen, rest and use of medications. Reports physical therapy treatments caused severe pain. She is currently managed by Dr. Lylia Sand with [facility] from chronic pain standpoint, she is taking Norco and Gabapentin . She has history of (2) lumbar spine surgeries. Left L1-L2 microdiscectomy in 2022 with Dr. Tommy Frames, left L1-L2 interbody fusion with Dr. Amalia Badder in 2023. Lumbar MRI imaging post surgery in 2023 shows interval PLIF at L1-L2 without residual or recurrent stenosis, multi level degenerative changes and advanced facet arthrosis on the right at L5-S1. No high grade spinal canal stenosis noted. She has undergone multiple lumbar epidural steroid injections in our office over the years with short term relief of pain. She has also undergone lumbar radiofrequency ablation with Dr. Janeece Mechanic with minimal relief of pain. She is here today to discuss candidacy for spinal cord stimulation. Patient denies focal weakness. No recent trauma or falls.      Review of Systems  Musculoskeletal:  Positive for back pain.  Neurological:  Negative for tingling, sensory change, focal weakness and weakness.  All  other systems reviewed and are negative.  Otherwise per HPI.  Assessment & Plan: Visit Diagnoses:    ICD-10-CM   1. Chronic bilateral low back pain with bilateral sciatica  M54.42    M54.41    G89.29     2. Lumbar radiculopathy  M54.16     3. Lumbar post-laminectomy syndrome  M96.1     4. Chronic pain syndrome  G89.4        Plan: Findings:  Chronic recalcitrant bilateral lower back pain radiating to both groin regions and down legs. Patient continues to have severe pain despite good conservative therapies such as formal physical therapy, home exercise regimen, rest and use of medications. Patients clinical presentation and exam are consistent with lumbar radiculopathy. Given her history of failed back surgery, failed conservative/interventional treatments and continued need for narcotic pain medications she does meet criteria for spinal cord stimulation. I explained to her that this is a procedure of last resort. I discussed SCS with her in detail today, however she was not able to verbalize understanding of our conversation. I am also concerned she would not be able to manage the technical aspects of using device remote and charging device. She does significantly rely on her husband to manage most technological aspects at home such as using computer and cell phone. Dr. Daisey Dryer also at bedside, he is in  agreeance and does not feel patient is ideal candidate for SCS. We recommend she follow up with Dr. Rayleen Cal and Dr. Sharl Davies for continued chronic pain management. Can also follow up with Dr. Sulema Endo for further discussion of possible surgical options.  Meds & Orders: No orders of the defined types were placed in this encounter.  No orders of the defined types were placed in this encounter.   Follow-up: Return if symptoms worsen or fail to improve.   Procedures: No procedures performed      Clinical History: CLINICAL DATA:  Initial evaluation for low back pain, prior  surgery, new symptoms.   EXAM: MRI LUMBAR SPINE WITHOUT AND WITH CONTRAST   TECHNIQUE: Multiplanar and multiecho pulse sequences of the lumbar spine were obtained without and with intravenous contrast.   CONTRAST:  10mL MULTIHANCE  GADOBENATE DIMEGLUMINE  529 MG/ML IV SOLN   COMPARISON:  Comparison made with prior MRI from 04/26/2021.   FINDINGS: Segmentation: Standard. Lowest well-formed disc space labeled the L5-S1 level.   Alignment: Up to 4 mm anterolisthesis of L5 on S1, stable. Trace anterolisthesis of L3 on L4, also relatively stable. Straightening of the normal lumbar lordosis elsewhere. Underlying sigmoid scoliosis.   Vertebrae: Susceptibility artifact from interval PLIF at L1-2. Vertebral body height maintained without acute or chronic fracture. Bone marrow signal intensity within normal limits. Few small benign hemangiomata noted. No worrisome osseous lesions. Mild reactive marrow edema noted about the partially visualized T10-11 interspace. Reactive edema present about the right L5-S1 facet due to facet arthritis.   Conus medullaris and cauda equina: Conus extends to the L1-2 level. Conus and cauda equina appear normal.   Paraspinal and other soft tissues: Postoperative changes within the posterior paraspinous soft tissues without adverse features. Few scattered cysts noted about the partially visualized kidneys, similar to prior, likely benign. No follow-up imaging recommended.   Disc levels:   T11-12: Seen only on sagittal projection. Mild disc bulge with reactive endplate spurring. Left worse than right facet hypertrophy. No significant spinal stenosis. Foramina appear patent.   T12-L1: Chronic endplate Schmorl's node deformities without significant disc bulge. Mild right-sided facet hypertrophy. No significant stenosis.   L1-2: Interval PLIF. No residual or recurrent spinal stenosis. Foramina appear patent.   L2-3: Disc bulge with disc desiccation and  intervertebral disc space narrowing. Disc bulging eccentric to the left. Reactive endplate spurring. Moderate left with mild right facet arthrosis. Resultant mild narrowing of the left lateral recess. Mild left L2 foraminal narrowing. Right neural foramen remains patent. Appearance is stable.   L3-4: Degenerative intervertebral disc space narrowing with diffuse disc bulge and disc desiccation. Disc bulging eccentric to the left with left-sided reactive endplate spurring. Resultant extraforaminal disc osteophyte contacts the exiting left L3 nerve root as it courses of the left neural foramen (series 101, image 20). Moderate left with mild right facet arthrosis. Resultant mild narrowing of the left lateral recess. Mild left L3 foraminal stenosis. Right neural foramen remains patent. Appearance is stable.   L4-5: Disc bulge with disc desiccation. Reactive endplate spurring. Mild to moderate facet hypertrophy. No more than mild narrowing of the lateral recesses bilaterally. Mild to moderate right with mild left L4 foraminal stenosis. Appearance is stable.   L5-S1: Anterolisthesis. Disc desiccation with broad posterior disc bulge. Severe right-sided facet arthrosis with reactive marrow edema. Moderate left-sided foraminal narrowing. No significant spinal stenosis. Mild right L5 foraminal stenosis. Appearance is similar.   IMPRESSION: 1. Interval PLIF at L1-2 without residual or recurrent stenosis. 2. Multilevel degenerative spondylosis elsewhere throughout the lumbar spine with resultant mild left lateral recess and foraminal stenosis at L2-3 and L3-4, and mild to moderate bilateral foraminal narrowing at L4-5 and L5-S1 as above. 3. Advanced right-sided facet arthrosis at L5-S1 with associated reactive marrow  edema, which could serve as a source for lower back pain.     Electronically Signed   By: Virgia Griffins M.D.   On: 12/12/2021 00:35   She reports that she has been  smoking cigarettes. She has a 12.5 pack-year smoking history. She has never used smokeless tobacco. No results for input(s): "HGBA1C", "LABURIC" in the last 8760 hours.  Objective:  VS:  HT:    WT:   BMI:     BP:   HR: bpm  TEMP: ( )  RESP:  Physical Exam Vitals and nursing note reviewed.  HENT:     Head: Normocephalic and atraumatic.     Right Ear: External ear normal.     Left Ear: External ear normal.     Nose: Nose normal.     Mouth/Throat:     Mouth: Mucous membranes are moist.  Eyes:     Extraocular Movements: Extraocular movements intact.  Cardiovascular:     Rate and Rhythm: Normal rate.     Pulses: Normal pulses.  Pulmonary:     Effort: Pulmonary effort is normal.  Abdominal:     General: Abdomen is flat. There is no distension.  Musculoskeletal:        General: Tenderness present.     Cervical back: Normal range of motion.     Comments: Patient rises from seated position to standing without difficulty. Good lumbar range of motion. No pain noted with facet loading. 5/5 strength noted with right hip flexion, knee flexion/extension, ankle dorsiflexion/plantarflexion and EHL. 4/5 strength noted with left ankle dorsiflexion/plantar flexion and EHL. No clonus noted bilaterally. No pain upon palpation of greater trochanters. No pain with internal/external rotation of bilateral hips. Sensation intact bilaterally. Negative slump test bilaterally. Ambulates without aid, gait steady.         Skin:    General: Skin is warm and dry.     Capillary Refill: Capillary refill takes less than 2 seconds.  Neurological:     General: No focal deficit present.     Mental Status: She is alert and oriented to person, place, and time.  Psychiatric:        Mood and Affect: Mood normal.        Behavior: Behavior normal.     Ortho Exam  Imaging: No results found.  Past Medical/Family/Surgical/Social History: Medications & Allergies reviewed per EMR, new medications updated. Patient  Active Problem List   Diagnosis Date Noted   Osteoarthritis of right hip 06/11/2023   Bladder cancer (HCC) 03/12/2023   Other secondary scoliosis, lumbar region 08/23/2021    Class: Chronic   Fusion of spine of lumbar region 08/23/2021   Lumbar post-laminectomy syndrome 06/15/2021   Lumbosacral spondylosis without myelopathy 02/28/2021   Foraminal stenosis due to intervertebral disc disease 01/21/2021   History of bladder cancer 01/21/2021   Labyrinthitis 12/08/2019   Visual loss, transient, bilateral 02/21/2019   Essential hypertension 02/21/2019   Colonic mass s/p lap ileocectomy 03/14/2018 03/14/2018   Acute lower GI bleeding 03/14/2018   Chronic anticoagulation 03/14/2018   Chronic tension-type headache, not intractable 05/31/2015   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) 03/11/2014   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   RLS (restless legs syndrome) 07/04/2012   History of cerebral aneurysm 03/26/2009   Vitamin D  deficiency 08/10/2008   Venous insufficiency 08/10/2008   History of acoustic neuroma 08/18/2007   Hypothyroidism 08/18/2007   Tobacco use disorder, continuous 08/18/2007   Mixed hyperlipidemia 07/18/2007  Anxiety with depression 07/18/2007   COPD (chronic obstructive pulmonary disease) with chronic bronchitis (HCC) 07/18/2007   Fibromyalgia 07/18/2007   Past Medical History:  Diagnosis Date   AN (acoustic neuroma) (HCC)    deafness in R ear   Aneurysm (HCC)    basilar tip aneursym s/p stent assisted coiling 04/26/09   Anxiety    Arthritis    Bladder cancer (HCC)    Cataract    Cigarette smoker    COPD (chronic obstructive pulmonary disease) (HCC)    mild, no inhalers or oxygen used   Depression    History of kidney stones    Hyperlipidemia    Hypertension    Hypothyroidism    IBS (irritable bowel syndrome)    Mitral valve prolapse    mild takes beta blocker   Osteopenia    Stroke (HCC)    x 3  ministrokes last one feb 2014   Venous insufficiency    Vitamin D  deficiency    Family History  Problem Relation Age of Onset   Heart disease Mother    Cancer Mother        Lung   Heart attack Mother    Diabetes Mother    Cancer Father        Lung   Diabetes Sister    Cancer Sister        unkown type   Diabetes Brother    Lung cancer Brother        Lung   AAA (abdominal aortic aneurysm) Neg Hx    Colon cancer Neg Hx    Stomach cancer Neg Hx    Esophageal cancer Neg Hx    Liver cancer Neg Hx    Pancreatic cancer Neg Hx    Rectal cancer Neg Hx    Past Surgical History:  Procedure Laterality Date   ANEURYSM COILING  04/2009   Dr. Alvira Josephs   APPENDECTOMY     BACK SURGERY  02/28/2021   BACK SURGERY     08/2021   BREAST LUMPECTOMY Right 1986   benign   CYSTOSCOPY W/ RETROGRADES Bilateral 01/19/2023   Procedure: BILATERAL RETROGRADE PYELOGRAM;  Surgeon: Christina Coyer, MD;  Location: [facility];  Service: Urology;  Laterality: Bilateral;   CYSTOSCOPY WITH BIOPSY N/A 01/19/2023   Procedure: CYSTOSCOPY WITH BLADDER BIOPSY POST OP GEMCITABINE ;  Surgeon: Christina Coyer, MD;  Location: [facility];  Service: Urology;  Laterality: N/A;   CYSTOSCOPY WITH FULGERATION N/A 01/19/2023   Procedure: CYSTOSCOPY WITH FULGERATION;  Surgeon: Christina Coyer, MD;  Location: [facility];  Service: Urology;  Laterality: N/A;  75 MINS FOR CASE   DIAGNOSTIC LAPAROSCOPY     Proable laparoscopic right colectomy 03-14-18 Dr. Alray Askew   EYE SURGERY Bilateral    ioc lens for cataracts   LAPAROSCOPIC RIGHT COLECTOMY Right 03/14/2018   Procedure: LAPAROSCOPIC ASSISTED  RIGHT COLECTOMY;  Surgeon: Ayesha Lente, MD;  Location: [facility];  Service: General;  Laterality: Right;   LAPAROSCOPY N/A 03/14/2018   Procedure: LAPAROSCOPY;  Surgeon: Ayesha Lente, MD;  Location: [facility];  Service: General;  Laterality: N/A;   LEFT HEART CATHETERIZATION WITH CORONARY ANGIOGRAM N/A 08/05/2014   Procedure: LEFT HEART  CATHETERIZATION WITH CORONARY ANGIOGRAM;  Surgeon: Wenona Hamilton, MD;  Location: [facility];  Service: Cardiovascular;  Laterality: N/A;   LUMBAR LAMINECTOMY N/A 02/28/2021   Procedure: LEFT L1-L2 MICRODISCECTOMY AND FASCIECTOMY;  Surgeon: Adah Acron, MD;  Location: [facility];  Service: Orthopedics;  Laterality: N/A;   TONSILLECTOMY  TOTAL ABDOMINAL HYSTERECTOMY  1970   complete   TRANSLABYRINTHINE PROCEDURE  2002   [facility] Dr. Carlette Cheers tumor removal   TRANSURETHRAL RESECTION OF BLADDER TUMOR N/A 02/08/2018   Procedure: TRANSURETHRAL RESECTION OF BLADDER TUMOR (TURBT)/ POSTOPERATIVE INSTILLATION OF CHEMO THERAPY;  Surgeon: Christina Coyer, MD;  Location: [facility];  Service: Urology;  Laterality: N/A;   Social History   Occupational History   Occupation: alterations   Occupation: Retired    Comment: Neurosurgeon  Tobacco Use   Smoking status: Every Day    Current packs/day: 0.25    Average packs/day: 0.3 packs/day for 50.0 years (12.5 ttl pk-yrs)    Types: Cigarettes   Smokeless tobacco: Never   Tobacco comments:    4-5 cigs daily  is aware she needs to quit  Vaping Use   Vaping status: Never Used  Substance and Sexual Activity   Alcohol  use: Never   Drug use: Never   Sexual activity: Not Currently

## 2023-10-11 NOTE — Progress Notes (Unsigned)
 Pain Scale   Average Pain 4 Patient advises she has had back surgery x2 ( dr.Yates and Dr. Richardo Chandler) Patient states she wakes up in am an is unable to walk without assistance and has to get assistance to her chair and place a heating pad on back and take medication to start her day.        +Driver, -BT, -Dye Allergies.

## 2023-10-19 ENCOUNTER — Encounter: Admitting: Physical Medicine & Rehabilitation

## 2023-10-29 ENCOUNTER — Ambulatory Visit (INDEPENDENT_AMBULATORY_CARE_PROVIDER_SITE_OTHER): Payer: Medicare Other | Admitting: Family Medicine

## 2023-10-29 ENCOUNTER — Encounter: Payer: Self-pay | Admitting: Family Medicine

## 2023-10-29 VITALS — BP 122/70 | HR 60 | Temp 97.1°F | Ht 62.0 in | Wt 118.6 lb

## 2023-10-29 DIAGNOSIS — E039 Hypothyroidism, unspecified: Secondary | ICD-10-CM | POA: Diagnosis not present

## 2023-10-29 DIAGNOSIS — I10 Essential (primary) hypertension: Secondary | ICD-10-CM

## 2023-10-29 DIAGNOSIS — F17210 Nicotine dependence, cigarettes, uncomplicated: Secondary | ICD-10-CM

## 2023-10-29 DIAGNOSIS — Z8551 Personal history of malignant neoplasm of bladder: Secondary | ICD-10-CM | POA: Diagnosis not present

## 2023-10-29 DIAGNOSIS — M961 Postlaminectomy syndrome, not elsewhere classified: Secondary | ICD-10-CM | POA: Diagnosis not present

## 2023-10-29 DIAGNOSIS — F17209 Nicotine dependence, unspecified, with unspecified nicotine-induced disorders: Secondary | ICD-10-CM

## 2023-10-29 NOTE — Assessment & Plan Note (Signed)
 Blood pressure is in good control. Continue amlodipine 5 mg daily.

## 2023-10-29 NOTE — Progress Notes (Signed)
 [facility] [address] [place] [address] Dept: [phone] Dept Fax: [phone]  Chronic Care Office Visit  Subjective:    Patient ID: Ana Santos, female    DOB: [birth year], 83 y.o..   MRN: [id number]  Chief Complaint  Patient presents with   Hypertension    3 month f/u.  No concerns.     History of Present Illness:  Patient is in today for reassessment of chronic medical issues.  Ms. Holsclaw has a history of hypertension, managed with amlodipine  5 mg daily.    Ms. Kempe has hyperlipidemia, which is managed with pravastatin  40 mg daily.   Ms. Thorns has a history of hypothyroidism. She is managed on 50 mcg daily and 25 mcg 1/2 tab daily (total daily dose of 62.5 mcg).      Ms. Sperry has a history of a fall in Sept. 2021. An MRI scan in May showed multi-level degenerative lumbar disease with foraminal stenoses. She had multiple ESIs without improvement. She had a left L1-L2 microdiscectomy on 02/28/2021. She had a lumbar fusion on 08/23/2021 due to recurrent disc herniation s/p microdiscectomy. She has had further ESIs and is being seen by pain management ([facility]). She is being managed on Vicodin 5-325 mg BID, duloxetine  20 mg daily and gabapentin  100 mg daily.   Ms. Mcphie has a history of bladder cancer.  She underwent cystoscopy and biopsies in August, which did show recurrence of noninvasive, high-grade papillary urothelial carcinoma. She was treated with a course of intravesicular chemotherapy. She continues to follow with urology.   Ms. Ringley continues to smoke 2-3 cigarettes a day. She notes he desire to smoke ahs been decreased.  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/23/2021    Class: Chronic   Fusion of spine of lumbar region 08/23/2021    Lumbar post-laminectomy syndrome 06/15/2021   Lumbosacral spondylosis without myelopathy 02/28/2021   Foraminal stenosis due to intervertebral disc disease 01/21/2021   History of bladder cancer 01/21/2021   Labyrinthitis 12/08/2019   Visual loss, transient, bilateral 02/21/2019   Essential hypertension 02/21/2019   Colonic mass s/p lap ileocectomy 03/14/2018 03/14/2018   Acute lower GI bleeding 03/14/2018   Chronic anticoagulation 03/14/2018   Chronic tension-type headache, not intractable 05/31/2015   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) 03/11/2014   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   RLS (restless legs syndrome) 07/04/2012   History of cerebral aneurysm 03/26/2009   Vitamin D  deficiency 08/10/2008   Venous insufficiency 08/10/2008   History of acoustic neuroma 08/18/2007   Hypothyroidism 08/18/2007   Tobacco use disorder, continuous 08/18/2007   Mixed hyperlipidemia 07/18/2007   Anxiety with depression 07/18/2007   COPD (chronic obstructive pulmonary disease) with chronic bronchitis (HCC) 07/18/2007   Fibromyalgia 07/18/2007   Past Surgical History:  Procedure Laterality Date   ANEURYSM COILING  04/2009   Dr. Alvira Josephs   APPENDECTOMY     BACK SURGERY  02/28/2021   BACK SURGERY     08/2021   BREAST LUMPECTOMY Right 1986   benign   CYSTOSCOPY W/ RETROGRADES Bilateral 01/19/2023   Procedure: BILATERAL RETROGRADE PYELOGRAM;  Surgeon: Christina Coyer, MD;  Location: [facility];  Service: Urology;  Laterality: Bilateral;   CYSTOSCOPY WITH BIOPSY N/A 01/19/2023   Procedure: CYSTOSCOPY WITH BLADDER BIOPSY POST OP GEMCITABINE ;  Surgeon: Christina Coyer, MD;  Location: [facility];  Service: Urology;  Laterality: N/A;   CYSTOSCOPY WITH FULGERATION N/A 01/19/2023   Procedure: CYSTOSCOPY WITH FULGERATION;  Surgeon: Christina Coyer, MD;  Location: [facility];  Service: Urology;  Laterality: N/A;  75 MINS FOR CASE   DIAGNOSTIC LAPAROSCOPY      Proable laparoscopic right colectomy 03-14-18 Dr. Alray Askew   EYE SURGERY Bilateral    ioc lens for cataracts   LAPAROSCOPIC RIGHT COLECTOMY Right 03/14/2018   Procedure: LAPAROSCOPIC ASSISTED  RIGHT COLECTOMY;  Surgeon: Ayesha Lente, MD;  Location: [facility];  Service: General;  Laterality: Right;   LAPAROSCOPY N/A 03/14/2018   Procedure: LAPAROSCOPY;  Surgeon: Ayesha Lente, MD;  Location: [facility];  Service: General;  Laterality: N/A;   LEFT HEART CATHETERIZATION WITH CORONARY ANGIOGRAM N/A 08/05/2014   Procedure: LEFT HEART CATHETERIZATION WITH CORONARY ANGIOGRAM;  Surgeon: Wenona Hamilton, MD;  Location: [facility];  Service: Cardiovascular;  Laterality: N/A;   LUMBAR LAMINECTOMY N/A 02/28/2021   Procedure: LEFT L1-L2 MICRODISCECTOMY AND FASCIECTOMY;  Surgeon: Adah Acron, MD;  Location: [facility];  Service: Orthopedics;  Laterality: N/A;   TONSILLECTOMY     TOTAL ABDOMINAL HYSTERECTOMY  1970   complete   TRANSLABYRINTHINE PROCEDURE  2002   [facility] Dr. Carlette Cheers tumor removal   TRANSURETHRAL RESECTION OF BLADDER TUMOR N/A 02/08/2018   Procedure: TRANSURETHRAL RESECTION OF BLADDER TUMOR (TURBT)/ POSTOPERATIVE INSTILLATION OF CHEMO THERAPY;  Surgeon: Christina Coyer, MD;  Location: [facility];  Service: Urology;  Laterality: N/A;   Family History  Problem Relation Age of Onset   Heart disease Mother    Cancer Mother        Lung   Heart attack Mother    Diabetes Mother    Cancer Father        Lung   Diabetes Sister    Cancer Sister        unkown type   Diabetes Brother    Lung cancer Brother        Lung   AAA (abdominal aortic aneurysm) Neg Hx    Colon cancer Neg Hx    Stomach cancer Neg Hx    Esophageal cancer Neg Hx    Liver cancer Neg Hx    Pancreatic cancer Neg Hx    Rectal cancer Neg Hx    Outpatient Medications Prior to Visit  Medication Sig Dispense Refill   amLODipine  (NORVASC ) 5 MG tablet TAKE 1 TABLET DAILY 90 tablet 3   Cholecalciferol  (VITAMIN  D-3) 125 MCG (5000 UT) TABS Take 5,000 Units by mouth daily.     clopidogrel  (PLAVIX ) 75 MG tablet TAKE 1 TABLET DAILY 90 tablet 3   diclofenac  Sodium (VOLTAREN  ARTHRITIS PAIN) 1 % GEL Apply 4 g topically 4 (four) times daily. 150 g 3   DULoxetine  (CYMBALTA ) 20 MG capsule TAKE 1 CAPSULE BY MOUTH EVERY DAY 90 capsule 3   gabapentin  (NEURONTIN ) 100 MG capsule Take 1-2 capsules (100-200 mg total) by mouth 3 (three) times daily. (Patient taking differently: Take 100-200 mg by mouth 3 (three) times daily. 1 tablet daily) 180 capsule 2   HYDROcodone -acetaminophen  (NORCO/VICODIN) 5-325 MG tablet Take 1 tablet by mouth every 8 (eight) hours as needed for moderate pain (pain score 4-6). Please do not fill until 08/15/23 90 tablet 0   hydroxypropyl methylcellulose / hypromellose (ISOPTO TEARS / GONIOVISC) 2.5 % ophthalmic solution Place 1 drop into both eyes at bedtime.     levothyroxine  (SYNTHROID ) 25 MCG tablet Take  0.5 tablets (12.5 mcg total) by mouth daily. Take together with 50 mcg tablet (total daily dose of 62.5 mcg) 90 tablet 3   levothyroxine  (SYNTHROID ) 50 MCG tablet Take 1 tablet (50 mcg total) by mouth daily. 90 tablet 3   methylPREDNISolone  (MEDROL  DOSEPAK) 4 MG TBPK tablet Take as prescribed on the box 21 tablet 0   pramipexole  (MIRAPEX ) 0.5 MG tablet TAKE 1 TABLET NIGHTLY ONE HOUR PRIOR TO GOING TO BED AS NEEDED FOR RESTLESS LEG SYNDROME 90 tablet 3   pravastatin  (PRAVACHOL ) 40 MG tablet TAKE 1 TABLET DAILY 90 tablet 3   SYNTHROID  75 MCG tablet Take 75 mcg by mouth daily.     Facility-Administered Medications Prior to Visit  Medication Dose Route Frequency Provider Last Rate Last Admin   gemcitabine  (GEMZAR ) chemo syringe for bladder instillation 2,000 mg  2,000 mg Bladder Instillation Once Eskridge, Matthew, MD       Allergies  Allergen Reactions   Prednisone  Other (See Comments)    Hallucinations   Atorvastatin Other (See Comments)     Lipitor caused arm pain (3/09)    Hydrocodone -Acetaminophen  Other (See Comments)    hallucinations   Morphine  Other (See Comments)    hallucinations   Nortriptyline  Nausea And Vomiting    Caused nausea and vomiting and shaking, kept her up all night   Topamax  [Topiramate ] Nausea And Vomiting    *dizziness*   Tramadol  Other (See Comments)    withdrawal   Objective:   Today's Vitals   10/29/23 0835  BP: 122/70  Pulse: 60  Temp: (!) 97.1 F (36.2 C)  TempSrc: Temporal  SpO2: 99%  Weight: 118 lb 9.6 oz (53.8 kg)  Height: 5\' 2"  (1.575 m)   Body mass index is 21.69 kg/m.   General: Well developed, well nourished. No acute distress. Psych: Alert and oriented. Normal mood and affect.  Health Maintenance Due  Topic Date Due   Zoster Vaccines- Shingrix (1 of 2) Never done   Medicare Annual Wellness (AWV)  08/01/2023     Assessment & Plan:   Problem List Items Addressed This Visit       Cardiovascular and Mediastinum   Essential hypertension - Primary   Blood pressure is in good control. Continue amlodipine  5 mg daily.         Endocrine   Hypothyroidism   TSH at goal. Continue levothyroxine  50 mcg daily and 1/2 of a 25 mcg tablet daily (total dose = 62.5 mg daily).        Other   History of bladder cancer   Continue to follow with urology regarding bladder cancer.      Lumbar post-laminectomy syndrome   Continue working with pain management.       Tobacco use disorder, continuous   I recommend that Ms. Intriago stop smoking. She is on a very low amount of cigarettes, so should be able to stop on her own without the need for medications. We discussed the link between her tobacco use and her bladder cancer.  I spent 3 minutes counseling the patient about tobacco cessation.        Return in about 3 months (around 01/29/2024) for Reassessment.   Graig Lawyer, MD

## 2023-10-29 NOTE — Assessment & Plan Note (Signed)
 Continue working with pain management

## 2023-10-29 NOTE — Assessment & Plan Note (Signed)
 TSH at goal. Continue levothyroxine 50 mcg daily and 1/2 of a 25 mcg tablet daily (total dose = 62.5 mg daily).

## 2023-10-29 NOTE — Assessment & Plan Note (Signed)
 Continue to follow with urology regarding bladder cancer.

## 2023-10-29 NOTE — Assessment & Plan Note (Signed)
 I recommend that Ana Santos stop smoking. She is on a very low amount of cigarettes, so should be able to stop on her own without the need for medications. We discussed the link between her tobacco use and her bladder cancer.  I spent 3 minutes counseling the patient about tobacco cessation.

## 2023-11-15 ENCOUNTER — Telehealth: Payer: Self-pay

## 2023-11-16 ENCOUNTER — Encounter: Attending: Physical Medicine & Rehabilitation | Admitting: Physical Medicine & Rehabilitation

## 2023-11-16 ENCOUNTER — Encounter: Payer: Self-pay | Admitting: Physical Medicine & Rehabilitation

## 2023-11-16 VITALS — BP 134/68 | HR 69 | Ht 62.0 in | Wt 120.0 lb

## 2023-11-16 DIAGNOSIS — G894 Chronic pain syndrome: Secondary | ICD-10-CM | POA: Diagnosis not present

## 2023-11-16 DIAGNOSIS — Z79891 Long term (current) use of opiate analgesic: Secondary | ICD-10-CM | POA: Insufficient documentation

## 2023-11-16 DIAGNOSIS — M47817 Spondylosis without myelopathy or radiculopathy, lumbosacral region: Secondary | ICD-10-CM | POA: Diagnosis not present

## 2023-11-16 DIAGNOSIS — M542 Cervicalgia: Secondary | ICD-10-CM | POA: Diagnosis not present

## 2023-11-16 MED ORDER — LIDOCAINE HCL (PF) 2 % IJ SOLN: 3.0000 mL | Freq: Once | INTRAMUSCULAR | Status: AC

## 2023-11-16 MED ORDER — LIDOCAINE HCL 1 % IJ SOLN: 10.0000 mL | Freq: Once | INTRAMUSCULAR | Status: AC

## 2023-11-16 NOTE — Progress Notes (Signed)
 Left L5 dorsal ramus., left L4 and left L3, L2 medial branch radio frequency neurotomy under fluoroscopic guidance  Indication: Low back pain due to lumbar spondylosis which has been relieved on 2 occasions by greater than 50% by lumbar medial branch blocks at corresponding levels.  Informed consent was obtained after describing risks and benefits of the procedure with the patient, this includes bleeding, bruising, infection, paralysis and medication side effects. The patient wishes to proceed and has given written consent. The patient was placed in a prone position. The lumbar and sacral area was marked and prepped with Betadine. A 25-gauge 1-1/2 inch needle was inserted into the skin and subcutaneous tissue at 3 sites in one ML of 1% lidocaine  was injected into each site. Then a 18-gauge 10 cm radio frequency needle with a 1 cm curved active tip was inserted targeting the left S1 SAP/sacral ala junction. Bone contact was made and confirmed with lateral imaging.  motor stimulation at 2 Hz confirm proper needle location followed by injection of one ML of 2% MPF lidocaine . Then the left L5 SAP/transverse process junction was targeted. Bone contact was made and confirmed with lateral imaging motor stimulation at 2 Hz confirm proper needle location followed by injection of one ML of the solution containing one ML of  2% MPF lidocaine . Then the left L4 SAP/transverse process junction was targeted. Bone contact was made and confirmed with lateral imaging. motor stimulation at 2 Hz confirm proper needle location followed by injection of one ML of the solution containing one ML of2% MPF lidocaine . Radio frequency lesion  at [facility] for 90 seconds was performed. Then the left L3 SAP/transverse process junction was targeted. Bone contact was made and confirmed with lateral imaging. motor stimulation at 2 Hz confirm proper needle location followed by injection of one ML of the solution containing one ML of2% MPF lidocaine .  Radio frequency lesion  at [facility] for 90 seconds was performed. Needles were removed. Post procedure instructions and vital signs were performed. Patient tolerated procedure well. Followup appointment was given.

## 2023-11-16 NOTE — Patient Instructions (Signed)
You had a radio frequency procedure today This was done to alleviate joint pain in your lumbar area We injected lidocaine which is a local anesthetic.  You may experience soreness at the injection sites. You may also experienced some irritation of the nerves that were heated I'm recommending ice for 30 minutes every 2 hours as needed for the next 24-48 hours   

## 2023-11-16 NOTE — Progress Notes (Signed)
  PROCEDURE RECORD [facility]   Name: Ana Santos DOB:08-28-1940 MRN: [id number]  Date:11/16/2023  Physician: Janeece Mechanic, MD    Nurse/CMA: Merlyn Starring, CMA  Allergies:  Allergies  Allergen Reactions   Prednisone  Other (See Comments)    Hallucinations   Atorvastatin Other (See Comments)     Lipitor caused arm pain (3/09)   Hydrocodone -Acetaminophen  Other (See Comments)    hallucinations   Morphine  Other (See Comments)    hallucinations   Nortriptyline  Nausea And Vomiting    Caused nausea and vomiting and shaking, kept her up all night   Topamax  [Topiramate ] Nausea And Vomiting    *dizziness*   Tramadol  Other (See Comments)    withdrawal    Consent Signed: Yes.    Is patient diabetic? No.  CBG today? .  Pregnant: No. LMP: No LMP recorded. Patient has had a hysterectomy. (age 64-55)  Anticoagulants: yes (Plavix ) Anti-inflammatory: no Antibiotics: no  Procedure: Left L2-3-4-5 Radiofrequency  Position: Prone Start Time: 1:31 pm  End Time: 1:49 pm  Fluoro Time: 55  RN/CMA Zemira Zehring,CMA Loveah Like,CMA    Time 1:10 pm 2:02 pm    BP 134/68 132/65    Pulse 69 79    Respirations 16 16    O2 Sat 96 99    S/S 6 6    Pain Level 5/10 1/10     D/C home with husband, [name] & O X 3, D/C instructions reviewed, and sits independently.

## 2023-11-16 NOTE — Addendum Note (Signed)
 Addended by: Avelina Bode T on: 11/16/2023 03:22 PM   Modules accepted: Orders

## 2023-11-19 NOTE — Telephone Encounter (Signed)
 Procedure was preformed and approved. Ana Santos received approval).

## 2023-11-20 ENCOUNTER — Other Ambulatory Visit: Payer: Self-pay

## 2023-11-20 MED ORDER — HYDROCODONE-ACETAMINOPHEN 5-325 MG PO TABS: 1.0000 | ORAL_TABLET | Freq: Three times a day (TID) | ORAL | 0 refills | Status: DC | PRN

## 2023-11-22 ENCOUNTER — Encounter: Payer: Self-pay | Admitting: Physical Medicine & Rehabilitation

## 2023-11-22 ENCOUNTER — Encounter: Admitting: Physical Medicine & Rehabilitation

## 2023-11-22 VITALS — BP 146/71 | HR 79 | Ht 62.0 in | Wt 118.0 lb

## 2023-11-22 DIAGNOSIS — M546 Pain in thoracic spine: Secondary | ICD-10-CM

## 2023-11-22 DIAGNOSIS — G894 Chronic pain syndrome: Secondary | ICD-10-CM

## 2023-11-22 DIAGNOSIS — M542 Cervicalgia: Secondary | ICD-10-CM

## 2023-11-22 DIAGNOSIS — Z79891 Long term (current) use of opiate analgesic: Secondary | ICD-10-CM

## 2023-11-22 DIAGNOSIS — M549 Dorsalgia, unspecified: Secondary | ICD-10-CM

## 2023-11-22 DIAGNOSIS — M47817 Spondylosis without myelopathy or radiculopathy, lumbosacral region: Secondary | ICD-10-CM | POA: Diagnosis not present

## 2023-11-22 DIAGNOSIS — M25551 Pain in right hip: Secondary | ICD-10-CM | POA: Diagnosis not present

## 2023-11-22 DIAGNOSIS — G8929 Other chronic pain: Secondary | ICD-10-CM

## 2023-11-22 DIAGNOSIS — M4309 Spondylolysis, multiple sites in spine: Secondary | ICD-10-CM | POA: Diagnosis not present

## 2023-11-22 DIAGNOSIS — Z981 Arthrodesis status: Secondary | ICD-10-CM | POA: Diagnosis not present

## 2023-11-22 MED ORDER — HYDROCODONE-ACETAMINOPHEN 5-325 MG PO TABS: 1.0000 | ORAL_TABLET | Freq: Three times a day (TID) | ORAL | 0 refills | Status: DC | PRN

## 2023-11-22 NOTE — Progress Notes (Signed)
 Subjective:    Patient ID: Ana Santos, female    DOB: 1940/10/27, 83 y.o.   MRN: [id number]  HPI    HPI Ana Santos is a 83 y.o. year old female  who  has a past medical history of AN (acoustic neuroma) (HCC), Aneurysm (HCC), Anxiety, Bladder cancer (HCC), Cataract, Cigarette smoker, COPD (chronic obstructive pulmonary disease) (HCC), Coronary artery disease, Depression, Headache, Hyperlipidemia, Hypertension, Hypothyroidism, IBS (irritable bowel syndrome), Mitral valve prolapse, Osteopenia, Stroke (HCC), Venous insufficiency, and Vitamin D  deficiency.   They are presenting to PM&R clinic as a new patient for pain management evaluation. They were referred for treatment of chronic lumbar and thoracic back pain.   She reports that she has had worsening pain for about 4 years.  No consistent pain shooting down her legs.  Pain is stabbing in quality.  Lifting anything makes it worse.  Denies any numbness or paresthesias in her limbs.  She had a prior left L1-2 hemilaminectomy and then later a L1-L2 TLIF in March 2023.  She is previously followed by Dr. Richardo Chandler.  She is now being seen by Dr. Colette Davies orthopedic spine surgery.  She reports a history of bowel surgery with small intestine removed previously.   Red flag symptoms: Patient denies saddle anesthesia, loss of bowel or bladder continence, new weakness, new numbness/tingling, or pain waking up at nighttime.   Medications tried: Tylenol , advil, lidcaine patch, icy hot with mild benefit She is currently on Plavix  which limits her medication options Hydrocodone  10 mg previously helped control her pain  tramadol -this did help her pain however she had very severe withdrawal issues when she did not get her medication on time, would like to avoid this medication if possible Oxycodone  helped her pain previously   Other treatments: PT/OT  made it worse Heat pad helps 10-15 ESI injections- reports minimal benefit  L1-2 hemilaminectomy  and then later a L1-L2 TLIF in March 2023.  Without sustained improvement   Interval history 06/27/2022 Ana Santos is here for follow-up regarding her chronic pain.  She continues to have lumbar and thoracic back pain.  She says the pain has been very severe since medications have been discontinued.  She did get some tramadol  at the end of December which provided mild benefit to her pain.  She denies any use of other substances or marijuana since her last visit.  She says she regrets having her back surgery done last year.   Interval history 08/29/2022 Ana Santos is here for follow-up regarding her chronic back pain primarily in her lumbar and thoracic back.  She also recently had a left SI joint injection by Dr. Sharl Davies.  This provided short-term pain relief however the pain returned after a day or two.  Hydrocodone  5 mg has been helping her pain and makes it more tolerable.  She is not having any side effects of this medication.  She does report that hydrocodone  does not last long enough and this leaves her in significant pain throughout the day.     Interval history 10/10/22 Ana Santos is here for follow-up regarding her chronic pain.  Pill counts appear to be consistent with her hydrocodone .  She is trying only uses medication when the pain is very severe.  No side effects with the hydrocodone  reported.  The hydrocodone  is helping her lower back pain remains tolerable.  She is also had some discomfort around her right inguinal area/anterior hip.  She says this started after doing some work in  the yard.  She has occasional use heating pads for her lower back and found this to be helpful.  She has not tried TENS before.     Interval History 12/07/22 Patient is here with her husband for follow-up regarding her chronic pain.  She continues to have severe lower back and thoracic back pain.  Back pain will radiate into her thighs.  She denies any recent changes in strength or sensation.   Hydrocodone  5 mg continues to help keep her pain controlled, she usually takes 1 in the morning, and will take another tablet later in the day if needed.  She has not had any recent falls.  No side effects with her medications.  Patient reports she is able to be more active when using the hydrocodone  and is able to get done more around her house.       Interval History 01/25/23 Patient is here with her husband following up regarding treatment for her chronic pain.  She continues to have lower back pain with some pain in her mid back and thighs right greater than left.  She also has pain shooting down her right leg.  Patient reports that hydrocodone  is helping keep her pain more tolerable.  She is not having any side effects with hydrocodone .  She tried physical therapy but says it made her pain worse.  She has not tried gabapentin , she says she did not know this was available.  Interval History 03/29/23 Ana Santos is here to follow-up on her chronic pain.  Patient reports she had about 80% improvement with bilateral lumbar L2, L3, L4 medial branch blocks with L5 dorsal ramus injection under for scopic guidance.  She also had a hip injection by orthopedics under ultrasound guidance with improvement in her right hip pain for couple weeks.  Hip pain has been returning.  She continues to use hydrocodone  when her pain is severe, no side effects with this medication reported.  Hydrocodone  allows her to be more active and get out of the house more.  She reports she has been treated for bladder cancer.  Interval History 05/24/23 Patient is here for follow-up regarding her chronic back and hip pain.  Patient was seen by Dr. Christiane Cowing for DJD right hip, recommend total hip replacement.  She plans to hold off for now but may consider at a later time.  She had good results with L2-3-4 5 MBB with Dr. Sharl Davies, has follow-up visit scheduled.  She reports in the meantime Norco 5 is keeping her pain at a tolerable level and  this allows her to be more active.  No significant side effects with the medication.  Interval history 07/29/22 Patient is here for follow-up for her chronic back and hip pain.  Patient had right L5 dorsal ramus, right L4, right L3 and L2 medial branch radiofrequency neurotomy under fluoroscopic guidance by Dr. Sharl Davies completed on 07/13/2023.  She reports she initially had severe pain in her right lower back after this procedure.  However after initial pain wore off she noticed a significant reduction over 50% of her right lower back pain.  She reports she can now lift her right leg a lot better than she was able to do previously.  Hydrocodone  continues to help her lower back pain, she tries to only use this when pain is severe.  Sometimes she will use 1/2 tab when the pain is more moderate.  No side effects with the medication.  Interval history 09/21/23 Ms. Schlup is here for follow-up  of her chronic low back pain.  Reports pain in her right lower back continues to be improved but she continues to have pain in her left lower back.  Patient says she thought today's visit was for the injection.  She had worsening episode of neck pain after doing some increased work in the yard, since this time has improved.  Hydrocodone  continues to keep her pain under control, no side effects with medication.   Interval history 11/22/23 Pain is a little improved with Left L5 dorsal ramus., left L4 and left L3, L2 medial branch radio frequency neurotomy completed 6 days ago. She reports procedure was very painful.  She thinks it may have helped 30-40 percent.  Left sided back pain is now in lower area of L spine than it was before. She usually takes 1 hydrocodone  tab usually twice a day and this is helping her pain.  No side effects with the medication. Neck continues to hurt but its not as bad.   Pain Inventory Average Pain 10 Pain Right Now 6 My pain is constant, sharp, and aching  In the last 24 hours, has  pain interfered with the following? General activity 10 Relation with others 10 Enjoyment of life 10 What TIME of day is your pain at its worst? morning , daytime, evening, and varies Sleep (in general) Poor  Pain is worse with: walking, bending, sitting, and some activites Pain improves with: heat, medication Relief from Meds: 6  Family History  Problem Relation Age of Onset   Heart disease Mother    Cancer Mother        Lung   Heart attack Mother    Diabetes Mother    Cancer Father        Lung   Diabetes Sister    Cancer Sister        unkown type   Diabetes Brother    Lung cancer Brother        Lung   AAA (abdominal aortic aneurysm) Neg Hx    Colon cancer Neg Hx    Stomach cancer Neg Hx    Esophageal cancer Neg Hx    Liver cancer Neg Hx    Pancreatic cancer Neg Hx    Rectal cancer Neg Hx    Social History   Socioeconomic History   Marital status: Married    Spouse name: Currie Douse   Number of children: 5   Years of education: Not on file   Highest education level: Not on file  Occupational History   Occupation: alterations   Occupation: Retired    Comment: Neurosurgeon  Tobacco Use   Smoking status: Every Day    Current packs/day: 0.25    Average packs/day: 0.3 packs/day for 50.0 years (12.5 ttl pk-yrs)    Types: Cigarettes   Smokeless tobacco: Never   Tobacco comments:    4-5 cigs daily  is aware she needs to quit  Vaping Use   Vaping status: Never Used  Substance and Sexual Activity   Alcohol  use: Never   Drug use: Never   Sexual activity: Not Currently  Other Topics Concern   Not on file  Social History Narrative   Pt is married, 5 sons (4 living), 14 grandchildren, 4 great grandchildren.   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   Social Drivers of Corporate investment banker Strain: Low Risk  (07/31/2022)   Overall Financial Resource Strain (CARDIA)    Difficulty of Paying Living  Expenses: Not hard at all   Food Insecurity: No Food Insecurity (07/31/2022)   Hunger Vital Sign    Worried About Running Out of Food in the Last Year: Never true    Ran Out of Food in the Last Year: Never true  Transportation Needs: No Transportation Needs (07/31/2022)   PRAPARE - Administrator, Civil Service (Medical): No    Lack of Transportation (Non-Medical): No  Physical Activity: Inactive (07/31/2022)   Exercise Vital Sign    Days of Exercise per Week: 0 days    Minutes of Exercise per Session: 0 min  Stress: No Stress Concern Present (07/31/2022)   [employer] of Occupational Health - Occupational Stress Questionnaire    Feeling of Stress : Not at all  Social Connections: Moderately Isolated (01/29/2020)   Social Connection and Isolation Panel    Frequency of Communication with Friends and Family: More than three times a week    Frequency of Social Gatherings with Friends and Family: Once a week    Attends Religious Services: Never    Database administrator or Organizations: No    Attends Banker Meetings: Never    Marital Status: Married   Past Surgical History:  Procedure Laterality Date   ANEURYSM COILING  04/2009   Dr. Alvira Josephs   APPENDECTOMY     BACK SURGERY  02/28/2021   BACK SURGERY     08/2021   BREAST LUMPECTOMY Right 1986   benign   CYSTOSCOPY W/ RETROGRADES Bilateral 01/19/2023   Procedure: BILATERAL RETROGRADE PYELOGRAM;  Surgeon: Christina Coyer, MD;  Location: [facility];  Service: Urology;  Laterality: Bilateral;   CYSTOSCOPY WITH BIOPSY N/A 01/19/2023   Procedure: CYSTOSCOPY WITH BLADDER BIOPSY POST OP GEMCITABINE ;  Surgeon: Christina Coyer, MD;  Location: [facility];  Service: Urology;  Laterality: N/A;   CYSTOSCOPY WITH FULGERATION N/A 01/19/2023   Procedure: CYSTOSCOPY WITH FULGERATION;  Surgeon: Christina Coyer, MD;  Location: [facility];  Service: Urology;  Laterality: N/A;  75 MINS FOR CASE   DIAGNOSTIC LAPAROSCOPY     Proable laparoscopic right colectomy  03-14-18 Dr. Alray Askew   EYE SURGERY Bilateral    ioc lens for cataracts   LAPAROSCOPIC RIGHT COLECTOMY Right 03/14/2018   Procedure: LAPAROSCOPIC ASSISTED  RIGHT COLECTOMY;  Surgeon: Ayesha Lente, MD;  Location: [facility];  Service: General;  Laterality: Right;   LAPAROSCOPY N/A 03/14/2018   Procedure: LAPAROSCOPY;  Surgeon: Ayesha Lente, MD;  Location: [facility];  Service: General;  Laterality: N/A;   LEFT HEART CATHETERIZATION WITH CORONARY ANGIOGRAM N/A 08/05/2014   Procedure: LEFT HEART CATHETERIZATION WITH CORONARY ANGIOGRAM;  Surgeon: Wenona Hamilton, MD;  Location: [facility];  Service: Cardiovascular;  Laterality: N/A;   LUMBAR LAMINECTOMY N/A 02/28/2021   Procedure: LEFT L1-L2 MICRODISCECTOMY AND FASCIECTOMY;  Surgeon: Adah Acron, MD;  Location: [facility];  Service: Orthopedics;  Laterality: N/A;   TONSILLECTOMY     TOTAL ABDOMINAL HYSTERECTOMY  1970   complete   TRANSLABYRINTHINE PROCEDURE  2002   [facility] Dr. Carlette Cheers tumor removal   TRANSURETHRAL RESECTION OF BLADDER TUMOR N/A 02/08/2018   Procedure: TRANSURETHRAL RESECTION OF BLADDER TUMOR (TURBT)/ POSTOPERATIVE INSTILLATION OF CHEMO THERAPY;  Surgeon: Christina Coyer, MD;  Location: [facility];  Service: Urology;  Laterality: N/A;   Past Surgical History:  Procedure Laterality Date   ANEURYSM COILING  04/2009   Dr. Alvira Josephs   APPENDECTOMY     BACK SURGERY  02/28/2021   BACK SURGERY  08/2021   BREAST LUMPECTOMY Right 1986   benign   CYSTOSCOPY W/ RETROGRADES Bilateral 01/19/2023   Procedure: BILATERAL RETROGRADE PYELOGRAM;  Surgeon: Christina Coyer, MD;  Location: [facility];  Service: Urology;  Laterality: Bilateral;   CYSTOSCOPY WITH BIOPSY N/A 01/19/2023   Procedure: CYSTOSCOPY WITH BLADDER BIOPSY POST OP GEMCITABINE ;  Surgeon: Christina Coyer, MD;  Location: [facility];  Service: Urology;  Laterality: N/A;   CYSTOSCOPY WITH FULGERATION N/A 01/19/2023   Procedure: CYSTOSCOPY WITH FULGERATION;  Surgeon:  Christina Coyer, MD;  Location: [facility];  Service: Urology;  Laterality: N/A;  75 MINS FOR CASE   DIAGNOSTIC LAPAROSCOPY     Proable laparoscopic right colectomy 03-14-18 Dr. Alray Askew   EYE SURGERY Bilateral    ioc lens for cataracts   LAPAROSCOPIC RIGHT COLECTOMY Right 03/14/2018   Procedure: LAPAROSCOPIC ASSISTED  RIGHT COLECTOMY;  Surgeon: Ayesha Lente, MD;  Location: [facility];  Service: General;  Laterality: Right;   LAPAROSCOPY N/A 03/14/2018   Procedure: LAPAROSCOPY;  Surgeon: Ayesha Lente, MD;  Location: [facility];  Service: General;  Laterality: N/A;   LEFT HEART CATHETERIZATION WITH CORONARY ANGIOGRAM N/A 08/05/2014   Procedure: LEFT HEART CATHETERIZATION WITH CORONARY ANGIOGRAM;  Surgeon: Wenona Hamilton, MD;  Location: [facility];  Service: Cardiovascular;  Laterality: N/A;   LUMBAR LAMINECTOMY N/A 02/28/2021   Procedure: LEFT L1-L2 MICRODISCECTOMY AND FASCIECTOMY;  Surgeon: Adah Acron, MD;  Location: [facility];  Service: Orthopedics;  Laterality: N/A;   TONSILLECTOMY     TOTAL ABDOMINAL HYSTERECTOMY  1970   complete   TRANSLABYRINTHINE PROCEDURE  2002   [facility] Dr. Carlette Cheers tumor removal   TRANSURETHRAL RESECTION OF BLADDER TUMOR N/A 02/08/2018   Procedure: TRANSURETHRAL RESECTION OF BLADDER TUMOR (TURBT)/ POSTOPERATIVE INSTILLATION OF CHEMO THERAPY;  Surgeon: Christina Coyer, MD;  Location[facility];  Service: Urology;  Laterality: N/A;   Past Medical History:  Diagnosis Date   AN (acoustic neuroma) (HCC)    deafness in R ear   Aneurysm (HCC)    basilar tip aneursym s/p stent assisted coiling 04/26/09   Anxiety    Arthritis    Bladder cancer (HCC)    Cataract    Cigarette smoker    COPD (chronic obstructive pulmonary disease) (HCC)    mild, no inhalers or oxygen used   Depression    History of kidney stones    Hyperlipidemia    Hypertension    Hypothyroidism    IBS (irritable bowel syndrome)    Mitral valve prolapse    mild takes beta  blocker   Osteopenia    Stroke (HCC)    x 3 ministrokes last one feb 2014   Venous insufficiency    Vitamin D  deficiency    There were no vitals taken for this visit.  Opioid Risk Score:  Fall Risk Score:  `1  Depression screen [facility] 2/9     10/29/2023    8:42 AM 06/11/2023    8:03 AM 06/08/2023   10:01 AM 05/24/2023    9:20 AM 04/17/2023   10:40 AM 03/29/2023    9:10 AM 01/25/2023    9:28 AM  Depression screen PHQ 2/9  Decreased Interest 3 1 0 0 2 1 1   Down, Depressed, Hopeless 1 3 0 0 2 1 1   PHQ - 2 Score 4 4 0 0 4 2 2   Altered sleeping 3 3       Tired, decreased energy 3 3       Change in appetite 1  3       Feeling bad or failure about yourself  3 0       Trouble concentrating 0 0       Moving slowly or fidgety/restless 0 1       Suicidal thoughts 0 0       PHQ-9 Score 14 14       Difficult doing work/chores Somewhat difficult Somewhat difficult         Review of Systems  Musculoskeletal:  Positive for back pain and neck pain.       Pain in right upper leg, right groin   All other systems reviewed and are negative.      Objective:   Physical Exam     11/16/2023    1:07 PM 10/29/2023    8:35 AM 09/21/2023    9:27 AM  Vitals with BMI  Height 5' 2 5' 2 5' 2  Weight 120 lbs 118 lbs 10 oz 121 lbs  BMI 21.94 21.69 22.13  Systolic 134 122 962  Diastolic 68 70 76  Pulse 69 60 65     Physical Exam Gen: no distress, normal appearing HEENT: oral mucosa pink and moist, NCAT Cardio: Reg rate Chest: normal effort, normal rate of breathing Abd: soft, non-distended Ext: no edema Psych: Very pleasant Skin: intact Neuro: Alert and awake, follows commands, cranial nerves II through XII grossly intact, speech and language normal Sensation to light touch in all 4 extremities Strength RLE 5/5 LLE, Hip flexion 4+/5,knee extension 4/4, ankle PF and DF 4/5  No ankle clonus Musculoskeletal:  Mild TTP C-spine paraspinal muscles left > right Slump b/l   testnegative Left L spine TTP mostly around L5-1 level today Facet loading positive   Prior exam  Minimal pain with right hip flexion today-improved Mild pain with right hip internal and external rotation Mild TTP at right greater trochanter Mildly antalgic gait   MRI T spine 05/01/22 FINDINGS: Alignment:  Physiologic.   Vertebrae: No acute fracture, evidence of discitis, or aggressive bone lesion. T7 vertebral body hemangioma noted.   Cord:  Normal signal and morphology.   Paraspinal and other soft tissues: No acute paraspinal abnormality.   Disc levels:   Disc spaces: Degenerative disease with disc height loss at T10-11 and T11-12. Degenerative disease with disc height loss at T1-2. Partially visualized on the scout images is degenerative disease with disc height loss at C3-4, C4-5 and C5-6.   T1-T2: Mild broad-based disc bulge. Severe right and moderate left foraminal stenosis. No spinal stenosis.   T2-T3: No disc protrusion, foraminal stenosis or central canal stenosis.   T3-T4: No disc protrusion, foraminal stenosis or central canal stenosis.   T4-T5: No disc protrusion, foraminal stenosis or central canal stenosis.   T5-T6: No disc protrusion, foraminal stenosis or central canal stenosis.   T6-T7: No disc protrusion, foraminal stenosis or central canal stenosis.   T7-T8: No disc protrusion, foraminal stenosis or central canal stenosis.   T8-T9: No disc protrusion, foraminal stenosis or central canal stenosis.   T9-T10: No disc protrusion, foraminal stenosis or central canal stenosis.   T10-T11: Mild broad-based disc bulge. No foraminal or central canal stenosis. Right perineural cyst noted.   T11-T12: No disc protrusion, foraminal stenosis or central canal stenosis.   IMPRESSION: 1. At T1-2 there is a mild broad-based disc bulge. Severe right and moderate left foraminal stenosis. 2. No acute osseous injury of the thoracic spine.   L spine MRI  12/10/21 FINDINGS: Segmentation: Standard. Lowest  well-formed disc space labeled the L5-S1 level.   Alignment: Up to 4 mm anterolisthesis of L5 on S1, stable. Trace anterolisthesis of L3 on L4, also relatively stable. Straightening of the normal lumbar lordosis elsewhere. Underlying sigmoid scoliosis.   Vertebrae: Susceptibility artifact from interval PLIF at L1-2. Vertebral body height maintained without acute or chronic fracture. Bone marrow signal intensity within normal limits. Few small benign hemangiomata noted. No worrisome osseous lesions. Mild reactive marrow edema noted about the partially visualized T10-11 interspace. Reactive edema present about the right L5-S1 facet due to facet arthritis.   Conus medullaris and cauda equina: Conus extends to the L1-2 level. Conus and cauda equina appear normal.   Paraspinal and other soft tissues: Postoperative changes within the posterior paraspinous soft tissues without adverse features. Few scattered cysts noted about the partially visualized kidneys, similar to prior, likely benign. No follow-up imaging recommended.   Disc levels:   T11-12: Seen only on sagittal projection. Mild disc bulge with reactive endplate spurring. Left worse than right facet hypertrophy. No significant spinal stenosis. Foramina appear patent.   T12-L1: Chronic endplate Schmorl's node deformities without significant disc bulge. Mild right-sided facet hypertrophy. No significant stenosis.   L1-2: Interval PLIF. No residual or recurrent spinal stenosis. Foramina appear patent.   L2-3: Disc bulge with disc desiccation and intervertebral disc space narrowing. Disc bulging eccentric to the left. Reactive endplate spurring. Moderate left with mild right facet arthrosis. Resultant mild narrowing of the left lateral recess. Mild left L2 foraminal narrowing. Right neural foramen remains patent. Appearance is stable.   L3-4: Degenerative intervertebral disc  space narrowing with diffuse disc bulge and disc desiccation. Disc bulging eccentric to the left with left-sided reactive endplate spurring. Resultant extraforaminal disc osteophyte contacts the exiting left L3 nerve root as it courses of the left neural foramen (series 101, image 20). Moderate left with mild right facet arthrosis. Resultant mild narrowing of the left lateral recess. Mild left L3 foraminal stenosis. Right neural foramen remains patent. Appearance is stable.   L4-5: Disc bulge with disc desiccation. Reactive endplate spurring. Mild to moderate facet hypertrophy. No more than mild narrowing of the lateral recesses bilaterally. Mild to moderate right with mild left L4 foraminal stenosis. Appearance is stable.   L5-S1: Anterolisthesis. Disc desiccation with broad posterior disc bulge. Severe right-sided facet arthrosis with reactive marrow edema. Moderate left-sided foraminal narrowing. No significant spinal stenosis. Mild right L5 foraminal stenosis. Appearance is similar.   IMPRESSION: 1. Interval PLIF at L1-2 without residual or recurrent stenosis. 2. Multilevel degenerative spondylosis elsewhere throughout the lumbar spine with resultant mild left lateral recess and foraminal stenosis at L2-3 and L3-4, and mild to moderate bilateral foraminal narrowing at L4-5 and L5-S1 as above. 3. Advanced right-sided facet arthrosis at L5-S1 with associated reactive marrow edema, which could serve as a source for lower back pain.  T-spine   R hip MRI 05/05/23 IMPRESSION: 1. Mild osteoarthritis of the hips. 2. Suspected focal tear of the anterior superior labrum with possible small defect in the adjacent articular cartilage. 3. Minimal partial tearing of the right proximal hamstring tendon with more striking partial tearing of the left proximal hamstring tendon. 4. Spurring of the pubic bones with low-level edema signal in both pubic bones, degenerative arthropathy versus  less likely osteitis pubis. 5. Fluid signal intensity inferiorly along the pubic symphysis and I cannot completely exclude the possibility of a right adductor aponeurotic tear.      Assessment & Plan:   Chronic lumbar and  thoracic back pain with history of prior lumbar surgery, SI joint pain -Has multi level degenerative spondylosis -Hx of  L1-L2 TLIF -UDS and pain agreement completed prior visit -Continue hydrocodone  at 5 mg dose Q4h PRN, no more than 3 doses a day #90 with each refill-medication refilled today -Pt seen by Dr. Sharl Davies for left SI joint injection- helped for few days.  Orthopedics has noted some pain in her right SI joint -Right Lower back pain improved with Right L5 dorsal ramus., Right L4,  Right L3 and L2 medial branch radio frequency neurotomy 07/13/23 -Left L5 dorsal ramus., left L4 and left L3, L2 medial branch radio frequency neurotomy completed few days ago.  May take some more time to find how much benefit this will have.  She reports location of pain is lower than it previously was She reports 30 to 40% benefit thus far but reports procedure was painful and she is hesitant to do it again at this time. -Pt has allergy noted to hydrocodone  in chart-she insists this is not correct and she does not have this allergy -Zynex TENS and Heat/Cold therapy device ordered prior visit -Continue gabapentin  -dose limited by sedation -Physical therapy-patient reports this did not provide benefit -Continue to monitor pill count, PDMP, UDS random   R anterior hip pain -Continue Voltaren  gel, continue medications as above -Patient reported short-term improvement after hip injection by orthopedics   THC use history -Patient reports she is not currently using -Continue random UDS -Formal warning letter regarding lower level THC was sent after UDS 09/21/23- Discussed with patient. She reports secondhand exposure after driving with someone in the car.  Discussed again today.  HTN-  well controlled today  -Continue follow-up with PCP  Neck pain -Improved

## 2023-12-28 ENCOUNTER — Telehealth: Payer: Self-pay

## 2023-12-28 MED ORDER — HYDROCODONE-ACETAMINOPHEN 5-325 MG PO TABS: 1.0000 | ORAL_TABLET | Freq: Three times a day (TID) | ORAL | 0 refills | Status: DC | PRN

## 2023-12-28 NOTE — Telephone Encounter (Signed)
 Resend prescription for Hydrocodone /APAP with diagnosis code. [facility] requires.

## 2023-12-29 ENCOUNTER — Other Ambulatory Visit: Payer: Self-pay | Admitting: Family Medicine

## 2023-12-29 DIAGNOSIS — E039 Hypothyroidism, unspecified: Secondary | ICD-10-CM

## 2024-01-02 ENCOUNTER — Telehealth: Payer: Self-pay

## 2024-01-02 NOTE — Telephone Encounter (Signed)
 Patient needs prescription for Hydro/APAP sent to [facility] in [place]. Looks like one was sent a mail order pharmacy but that is the wrong pharmacy.

## 2024-01-03 MED ORDER — HYDROCODONE-ACETAMINOPHEN 5-325 MG PO TABS: 1.0000 | ORAL_TABLET | Freq: Three times a day (TID) | ORAL | 0 refills | Status: DC | PRN

## 2024-01-04 ENCOUNTER — Ambulatory Visit

## 2024-01-11 ENCOUNTER — Telehealth: Payer: Self-pay

## 2024-01-11 MED ORDER — HYDROCODONE-ACETAMINOPHEN 5-325 MG PO TABS: 1.0000 | ORAL_TABLET | Freq: Three times a day (TID) | ORAL | 0 refills | Status: DC | PRN

## 2024-01-11 NOTE — Telephone Encounter (Signed)
 Pharmacy sent message to resend Hydro/APAP script with diagnosis code.

## 2024-01-22 ENCOUNTER — Encounter: Payer: Self-pay | Admitting: Physical Medicine & Rehabilitation

## 2024-01-22 ENCOUNTER — Encounter: Attending: Physical Medicine & Rehabilitation | Admitting: Physical Medicine & Rehabilitation

## 2024-01-22 VITALS — BP 121/67 | HR 59 | Ht 62.0 in | Wt 112.0 lb

## 2024-01-22 DIAGNOSIS — G894 Chronic pain syndrome: Secondary | ICD-10-CM | POA: Diagnosis not present

## 2024-01-22 DIAGNOSIS — Z5181 Encounter for therapeutic drug level monitoring: Secondary | ICD-10-CM | POA: Insufficient documentation

## 2024-01-22 DIAGNOSIS — M47817 Spondylosis without myelopathy or radiculopathy, lumbosacral region: Secondary | ICD-10-CM | POA: Diagnosis not present

## 2024-01-22 DIAGNOSIS — Z79891 Long term (current) use of opiate analgesic: Secondary | ICD-10-CM | POA: Insufficient documentation

## 2024-01-22 MED ORDER — HYDROCODONE-ACETAMINOPHEN 5-325 MG PO TABS: 1.0000 | ORAL_TABLET | Freq: Three times a day (TID) | ORAL | 0 refills | Status: DC | PRN

## 2024-01-22 NOTE — Progress Notes (Signed)
 Subjective:    Patient ID: Ana Santos, female    DOB: Mar 06, 1941, 83 y.o.   MRN: [id number]  HPI    Ana Santos is a 83 y.o. year old female  who  has a past medical history of AN (acoustic neuroma) (HCC), Aneurysm (HCC), Anxiety, Bladder cancer (HCC), Cataract, Cigarette smoker, COPD (chronic obstructive pulmonary disease) (HCC), Coronary artery disease, Depression, Headache, Hyperlipidemia, Hypertension, Hypothyroidism, IBS (irritable bowel syndrome), Mitral valve prolapse, Osteopenia, Stroke (HCC), Venous insufficiency, and Vitamin D  deficiency.   They are presenting to PM&R clinic as a new patient for pain management evaluation. They were referred for treatment of chronic lumbar and thoracic back pain.   She reports that she has had worsening pain for about 4 years.  No consistent pain shooting down her legs.  Pain is stabbing in quality.  Lifting anything makes it worse.  Denies any numbness or paresthesias in her limbs.  She had a prior left L1-2 hemilaminectomy and then later a L1-L2 TLIF in March 2023.  She is previously followed by Dr. Lucilla.  She is now being seen by Dr. Ozell Ada orthopedic spine surgery.  She reports a history of bowel surgery with small intestine removed previously.   Red flag symptoms: Patient denies saddle anesthesia, loss of bowel or bladder continence, new weakness, new numbness/tingling, or pain waking up at nighttime.   Medications tried: Tylenol , advil, lidcaine patch, icy hot with mild benefit She is currently on Plavix  which limits her medication options Hydrocodone  10 mg previously helped control her pain  tramadol -this did help her pain however she had very severe withdrawal issues when she did not get her medication on time, would like to avoid this medication if possible Oxycodone  helped her pain previously   Other treatments: PT/OT  made it worse Heat pad helps 10-15 ESI injections- reports minimal benefit  L1-2 hemilaminectomy and  then later a L1-L2 TLIF in March 2023.  Without sustained improvement   Interval history 06/27/2022 Ana Santos is here for follow-up regarding her chronic pain.  She continues to have lumbar and thoracic back pain.  She says the pain has been very severe since medications have been discontinued.  She did get some tramadol  at the end of December which provided mild benefit to her pain.  She denies any use of other substances or marijuana since her last visit.  She says she regrets having her back surgery done last year.   Interval history 08/29/2022 Ana Santos is here for follow-up regarding her chronic back pain primarily in her lumbar and thoracic back.  She also recently had a left SI joint injection by Dr. Carilyn.  This provided short-term pain relief however the pain returned after a day or two.  Hydrocodone  5 mg has been helping her pain and makes it more tolerable.  She is not having any side effects of this medication.  She does report that hydrocodone  does not last long enough and this leaves her in significant pain throughout the day.     Interval history 10/10/22 Ana Santos is here for follow-up regarding her chronic pain.  Pill counts appear to be consistent with her hydrocodone .  She is trying only uses medication when the pain is very severe.  No side effects with the hydrocodone  reported.  The hydrocodone  is helping her lower back pain remains tolerable.  She is also had some discomfort around her right inguinal area/anterior hip.  She says this started after doing some work in the  yard.  She has occasional use heating pads for her lower back and found this to be helpful.  She has not tried TENS before.     Interval History 12/07/22 Patient is here with her husband for follow-up regarding her chronic pain.  She continues to have severe lower back and thoracic back pain.  Back pain will radiate into her thighs.  She denies any recent changes in strength or sensation.  Hydrocodone  5  mg continues to help keep her pain controlled, she usually takes 1 in the morning, and will take another tablet later in the day if needed.  She has not had any recent falls.  No side effects with her medications.  Patient reports she is able to be more active when using the hydrocodone  and is able to get done more around her house.       Interval History 01/25/23 Patient is here with her husband following up regarding treatment for her chronic pain.  She continues to have lower back pain with some pain in her mid back and thighs right greater than left.  She also has pain shooting down her right leg.  Patient reports that hydrocodone  is helping keep her pain more tolerable.  She is not having any side effects with hydrocodone .  She tried physical therapy but says it made her pain worse.  She has not tried gabapentin , she says she did not know this was available.  Interval History 03/29/23 Ana Santos is here to follow-up on her chronic pain.  Patient reports she had about 80% improvement with bilateral lumbar L2, L3, L4 medial branch blocks with L5 dorsal ramus injection under for scopic guidance.  She also had a hip injection by orthopedics under ultrasound guidance with improvement in her right hip pain for couple weeks.  Hip pain has been returning.  She continues to use hydrocodone  when her pain is severe, no side effects with this medication reported.  Hydrocodone  allows her to be more active and get out of the house more.  She reports she has been treated for bladder cancer.  Interval History 05/24/23 Patient is here for follow-up regarding her chronic back and hip pain.  Patient was seen by Dr. Jerri for DJD right hip, recommend total hip replacement.  She plans to hold off for now but may consider at a later time.  She had good results with L2-3-4 5 MBB with Dr. Carilyn, has follow-up visit scheduled.  She reports in the meantime Norco 5 is keeping her pain at a tolerable level and this allows her  to be more active.  No significant side effects with the medication.  Interval history 07/29/22 Patient is here for follow-up for her chronic back and hip pain.  Patient had right L5 dorsal ramus, right L4, right L3 and L2 medial branch radiofrequency neurotomy under fluoroscopic guidance by Dr. Carilyn completed on 07/13/2023.  She reports she initially had severe pain in her right lower back after this procedure.  However after initial pain wore off she noticed a significant reduction over 50% of her right lower back pain.  She reports she can now lift her right leg a lot better than she was able to do previously.  Hydrocodone  continues to help her lower back pain, she tries to only use this when pain is severe.  Sometimes she will use 1/2 tab when the pain is more moderate.  No side effects with the medication.  Interval history 09/21/23 Ms. Kubin is here for follow-up of  her chronic low back pain.  Reports pain in her right lower back continues to be improved but she continues to have pain in her left lower back.  Patient says she thought today's visit was for the injection.  She had worsening episode of neck pain after doing some increased work in the yard, since this time has improved.  Hydrocodone  continues to keep her pain under control, no side effects with medication.   Interval history 11/22/23 Pain is a little improved with Left L5 dorsal ramus., left L4 and left L3, L2 medial branch radio frequency neurotomy completed 6 days ago. She reports procedure was very painful.  She thinks it may have helped 30-40 percent.  Left sided back pain is now in lower area of L spine than it was before. She usually takes 1 hydrocodone  tab usually twice a day and this is helping her pain.  No side effects with the medication. Neck continues to hurt but its not as bad.   Interval history 01/22/24 Patient is here for follow-up of her chronic back pain.  Left L-spine RFA helped reduce the pain about 50% but  did not work as well as it did on the right side.  Pain in her right lower back remains improved and most of her pain is on the left side.  Hydrocodone  continues to help keep her pain under control reducing pain from about 10 out of 10 before taking it to about 4 out of 10 afterwards.  She is not having any significant side effects with the medication.  Pain medication helps her to be more active.  She still has difficulty with very physical tasks around the house and garden.  She reports her son is helping with her flower beds.  She is trying to avoid frequent use of Norco.  She does take it most days in the mornings but tries to avoid taking it too frequently.  She does not have any side effects with the medication.  Pain Inventory Average Pain 10 Pain Right Now 7 My pain is constant, sharp, and aching, stabbing  In the last 24 hours, has pain interfered with the following? General activity 10 Relation with others 10 Enjoyment of life 10 What TIME of day is your pain at its worst? Morning and nighttime Sleep (in general) Poor  Pain is worse with: walking, bending, sitting, and some activites Pain improves with: heat, medication Relief from Meds: 9  Family History  Problem Relation Age of Onset   Heart disease Mother    Cancer Mother        Lung   Heart attack Mother    Diabetes Mother    Cancer Father        Lung   Diabetes Sister    Cancer Sister        unkown type   Diabetes Brother    Lung cancer Brother        Lung   AAA (abdominal aortic aneurysm) Neg Hx    Colon cancer Neg Hx    Stomach cancer Neg Hx    Esophageal cancer Neg Hx    Liver cancer Neg Hx    Pancreatic cancer Neg Hx    Rectal cancer Neg Hx    Social History   Socioeconomic History   Marital status: Married    Spouse name: Tanda   Number of children: 5   Years of education: Not on file   Highest education level: Not on file  Occupational History  Occupation: alterations   Occupation: Retired     Comment: Neurosurgeon  Tobacco Use   Smoking status: Every Day    Current packs/day: 0.25    Average packs/day: 0.3 packs/day for 50.0 years (12.5 ttl pk-yrs)    Types: Cigarettes   Smokeless tobacco: Never   Tobacco comments:    4-5 cigs daily  is aware she needs to quit  Vaping Use   Vaping status: Never Used  Substance and Sexual Activity   Alcohol  use: Never   Drug use: Never   Sexual activity: Not Currently  Other Topics Concern   Not on file  Social History Narrative   Pt is married, 5 sons (4 living), 14 grandchildren, 4 great grandchildren.   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   Social Drivers of Corporate investment banker Strain: Low Risk  (07/31/2022)   Overall Financial Resource Strain (CARDIA)    Difficulty of Paying Living Expenses: Not hard at all  Food Insecurity: No Food Insecurity (07/31/2022)   Hunger Vital Sign    Worried About Running Out of Food in the Last Year: Never true    Ran Out of Food in the Last Year: Never true  Transportation Needs: No Transportation Needs (07/31/2022)   PRAPARE - Administrator, Civil Service (Medical): No    Lack of Transportation (Non-Medical): No  Physical Activity: Inactive (07/31/2022)   Exercise Vital Sign    Days of Exercise per Week: 0 days    Minutes of Exercise per Session: 0 min  Stress: No Stress Concern Present (07/31/2022)   [employer] of Occupational Health - Occupational Stress Questionnaire    Feeling of Stress : Not at all  Social Connections: Moderately Isolated (01/29/2020)   Social Connection and Isolation Panel    Frequency of Communication with Friends and Family: More than three times a week    Frequency of Social Gatherings with Friends and Family: Once a week    Attends Religious Services: Never    Database administrator or Organizations: No    Attends Banker Meetings: Never    Marital Status: Married   Past Surgical History:   Procedure Laterality Date   ANEURYSM COILING  04/2009   Dr. Dolphus   APPENDECTOMY     BACK SURGERY  02/28/2021   BACK SURGERY     08/2021   BREAST LUMPECTOMY Right 1986   benign   CYSTOSCOPY W/ RETROGRADES Bilateral 01/19/2023   Procedure: BILATERAL RETROGRADE PYELOGRAM;  Surgeon: Nieves Cough, MD;  Location: [facility];  Service: Urology;  Laterality: Bilateral;   CYSTOSCOPY WITH BIOPSY N/A 01/19/2023   Procedure: CYSTOSCOPY WITH BLADDER BIOPSY POST OP GEMCITABINE ;  Surgeon: Nieves Cough, MD;  Location: [facility];  Service: Urology;  Laterality: N/A;   CYSTOSCOPY WITH FULGERATION N/A 01/19/2023   Procedure: CYSTOSCOPY WITH FULGERATION;  Surgeon: Nieves Cough, MD;  Location: [facility];  Service: Urology;  Laterality: N/A;  75 MINS FOR CASE   DIAGNOSTIC LAPAROSCOPY     Proable laparoscopic right colectomy 03-14-18 Dr. Mikell   EYE SURGERY Bilateral    ioc lens for cataracts   LAPAROSCOPIC RIGHT COLECTOMY Right 03/14/2018   Procedure: LAPAROSCOPIC ASSISTED  RIGHT COLECTOMY;  Surgeon: Mikell Katz, MD;  Location: [facility];  Service: General;  Laterality: Right;   LAPAROSCOPY N/A 03/14/2018   Procedure: LAPAROSCOPY;  Surgeon: Mikell Katz, MD;  Location: [facility];  Service: General;  Laterality: N/A;   LEFT HEART  CATHETERIZATION WITH CORONARY ANGIOGRAM N/A 08/05/2014   Procedure: LEFT HEART CATHETERIZATION WITH CORONARY ANGIOGRAM;  Surgeon: Deatrice DELENA Cage, MD;  Location: [facility];  Service: Cardiovascular;  Laterality: N/A;   LUMBAR LAMINECTOMY N/A 02/28/2021   Procedure: LEFT L1-L2 MICRODISCECTOMY AND FASCIECTOMY;  Surgeon: Barbarann Oneil BROCKS, MD;  Location: [facility];  Service: Orthopedics;  Laterality: N/A;   TONSILLECTOMY     TOTAL ABDOMINAL HYSTERECTOMY  1970   complete   TRANSLABYRINTHINE PROCEDURE  2002   [facility] Dr. Graig tumor removal   TRANSURETHRAL RESECTION OF BLADDER TUMOR N/A 02/08/2018   Procedure: TRANSURETHRAL RESECTION OF BLADDER TUMOR (TURBT)/ POSTOPERATIVE  INSTILLATION OF CHEMO THERAPY;  Surgeon: Nieves Cough, MD;  Location: [facility];  Service: Urology;  Laterality: N/A;   Past Surgical History:  Procedure Laterality Date   ANEURYSM COILING  04/2009   Dr. Dolphus   APPENDECTOMY     BACK SURGERY  02/28/2021   BACK SURGERY     08/2021   BREAST LUMPECTOMY Right 1986   benign   CYSTOSCOPY W/ RETROGRADES Bilateral 01/19/2023   Procedure: BILATERAL RETROGRADE PYELOGRAM;  Surgeon: Nieves Cough, MD;  Location: [facility];  Service: Urology;  Laterality: Bilateral;   CYSTOSCOPY WITH BIOPSY N/A 01/19/2023   Procedure: CYSTOSCOPY WITH BLADDER BIOPSY POST OP GEMCITABINE ;  Surgeon: Nieves Cough, MD;  Location: [facility];  Service: Urology;  Laterality: N/A;   CYSTOSCOPY WITH FULGERATION N/A 01/19/2023   Procedure: CYSTOSCOPY WITH FULGERATION;  Surgeon: Nieves Cough, MD;  Location: [facility];  Service: Urology;  Laterality: N/A;  75 MINS FOR CASE   DIAGNOSTIC LAPAROSCOPY     Proable laparoscopic right colectomy 03-14-18 Dr. Mikell   EYE SURGERY Bilateral    ioc lens for cataracts   LAPAROSCOPIC RIGHT COLECTOMY Right 03/14/2018   Procedure: LAPAROSCOPIC ASSISTED  RIGHT COLECTOMY;  Surgeon: Mikell Katz, MD;  Location: [facility];  Service: General;  Laterality: Right;   LAPAROSCOPY N/A 03/14/2018   Procedure: LAPAROSCOPY;  Surgeon: Mikell Katz, MD;  Location: [facility];  Service: General;  Laterality: N/A;   LEFT HEART CATHETERIZATION WITH CORONARY ANGIOGRAM N/A 08/05/2014   Procedure: LEFT HEART CATHETERIZATION WITH CORONARY ANGIOGRAM;  Surgeon: Deatrice DELENA Cage, MD;  Location: [facility];  Service: Cardiovascular;  Laterality: N/A;   LUMBAR LAMINECTOMY N/A 02/28/2021   Procedure: LEFT L1-L2 MICRODISCECTOMY AND FASCIECTOMY;  Surgeon: Barbarann Oneil BROCKS, MD;  Location: [facility];  Service: Orthopedics;  Laterality: N/A;   TONSILLECTOMY     TOTAL ABDOMINAL HYSTERECTOMY  1970   complete   TRANSLABYRINTHINE PROCEDURE  2002   [facility]  Dr. Graig tumor removal   TRANSURETHRAL RESECTION OF BLADDER TUMOR N/A 02/08/2018   Procedure: TRANSURETHRAL RESECTION OF BLADDER TUMOR (TURBT)/ POSTOPERATIVE INSTILLATION OF CHEMO THERAPY;  Surgeon: Nieves Cough, MD;  Location[facility];  Service: Urology;  Laterality: N/A;   Past Medical History:  Diagnosis Date   AN (acoustic neuroma) (HCC)    deafness in R ear   Aneurysm (HCC)    basilar tip aneursym s/p stent assisted coiling 04/26/09   Anxiety    Arthritis    Bladder cancer (HCC)    Cataract    Cigarette smoker    COPD (chronic obstructive pulmonary disease) (HCC)    mild, no inhalers or oxygen used   Depression    History of kidney stones    Hyperlipidemia    Hypertension    Hypothyroidism    IBS (irritable bowel syndrome)    Mitral valve prolapse  mild takes beta blocker   Osteopenia    Stroke (HCC)    x 3 ministrokes last one feb 2014   Venous insufficiency    Vitamin D  deficiency    BP 121/67 (BP Location: Left Arm, Patient Position: Sitting, Cuff Size: Normal)   Pulse (!) 59   Ht 5' 2 (1.575 m)   Wt 112 lb (50.8 kg)   SpO2 97%   BMI 20.49 kg/m   Opioid Risk Score:  Fall Risk Score:  `1  Depression screen [facility] 2/9     01/22/2024    9:41 AM 11/22/2023   12:08 PM 10/29/2023    8:42 AM 06/11/2023    8:03 AM 06/08/2023   10:01 AM 05/24/2023    9:20 AM 04/17/2023   10:40 AM  Depression screen PHQ 2/9  Decreased Interest 3 0 3 1 0 0 2  Down, Depressed, Hopeless 3 0 1 3 0 0 2  PHQ - 2 Score 6 0 4 4 0 0 4  Altered sleeping 3 0 3 3     Tired, decreased energy 3 0 3 3     Change in appetite 0 0 1 3     Feeling bad or failure about yourself  0 0 3 0     Trouble concentrating 0 0 0 0     Moving slowly or fidgety/restless 0 0 0 1     Suicidal thoughts 0 0 0 0     PHQ-9 Score 12 0 14 14     Difficult doing work/chores Extremely dIfficult Not difficult at all Somewhat difficult Somewhat difficult       Review of Systems   Constitutional:  Positive for unexpected weight change.       Unintentional Weight loss  Musculoskeletal:  Positive for back pain and neck pain.       Pain in right upper leg, right groin, neck pain, lower back (right sided)  All other systems reviewed and are negative.      Objective:   Physical Exam     01/22/2024    9:42 AM 11/22/2023   12:03 PM 11/16/2023    1:07 PM  Vitals with BMI  Height 5' 2 5' 2 5' 2  Weight 112 lbs 118 lbs 120 lbs  BMI 20.48 21.58 21.94  Systolic 121 146 865  Diastolic 67 71 68  Pulse 59 79 69     Physical Exam Gen: no distress, normal appearing HEENT: oral mucosa pink and moist, NCAT Cardio: Reg rate Chest: normal effort, normal rate of breathing Abd: soft, non-distended Ext: no edema Psych: Very pleasant Skin: intact Neuro: Alert and awake, follows commands, cranial nerves II through XII grossly intact, speech and language normal Sensation to light touch in all 4 extremities Strength RLE 5/5 LLE, Hip flexion 4+/5,knee extension 4+/4, ankle PF and DF 4/5 No hypertonia noted Musculoskeletal:  Left L-spine paraspinal muscle tenderness present SLR negative bilaterally Facet loading positive   Prior exam  Minimal pain with right hip flexion today-improved Mild pain with right hip internal and external rotation Mild TTP at right greater trochanter Mildly antalgic gait   MRI T spine 05/01/22 FINDINGS: Alignment:  Physiologic.   Vertebrae: No acute fracture, evidence of discitis, or aggressive bone lesion. T7 vertebral body hemangioma noted.   Cord:  Normal signal and morphology.   Paraspinal and other soft tissues: No acute paraspinal abnormality.   Disc levels:   Disc spaces: Degenerative disease with disc height loss at T10-11 and T11-12.  Degenerative disease with disc height loss at T1-2. Partially visualized on the scout images is degenerative disease with disc height loss at C3-4, C4-5 and C5-6.   T1-T2: Mild  broad-based disc bulge. Severe right and moderate left foraminal stenosis. No spinal stenosis.   T2-T3: No disc protrusion, foraminal stenosis or central canal stenosis.   T3-T4: No disc protrusion, foraminal stenosis or central canal stenosis.   T4-T5: No disc protrusion, foraminal stenosis or central canal stenosis.   T5-T6: No disc protrusion, foraminal stenosis or central canal stenosis.   T6-T7: No disc protrusion, foraminal stenosis or central canal stenosis.   T7-T8: No disc protrusion, foraminal stenosis or central canal stenosis.   T8-T9: No disc protrusion, foraminal stenosis or central canal stenosis.   T9-T10: No disc protrusion, foraminal stenosis or central canal stenosis.   T10-T11: Mild broad-based disc bulge. No foraminal or central canal stenosis. Right perineural cyst noted.   T11-T12: No disc protrusion, foraminal stenosis or central canal stenosis.   IMPRESSION: 1. At T1-2 there is a mild broad-based disc bulge. Severe right and moderate left foraminal stenosis. 2. No acute osseous injury of the thoracic spine.   L spine MRI 12/10/21 FINDINGS: Segmentation: Standard. Lowest well-formed disc space labeled the L5-S1 level.   Alignment: Up to 4 mm anterolisthesis of L5 on S1, stable. Trace anterolisthesis of L3 on L4, also relatively stable. Straightening of the normal lumbar lordosis elsewhere. Underlying sigmoid scoliosis.   Vertebrae: Susceptibility artifact from interval PLIF at L1-2. Vertebral body height maintained without acute or chronic fracture. Bone marrow signal intensity within normal limits. Few small benign hemangiomata noted. No worrisome osseous lesions. Mild reactive marrow edema noted about the partially visualized T10-11 interspace. Reactive edema present about the right L5-S1 facet due to facet arthritis.   Conus medullaris and cauda equina: Conus extends to the L1-2 level. Conus and cauda equina appear normal.    Paraspinal and other soft tissues: Postoperative changes within the posterior paraspinous soft tissues without adverse features. Few scattered cysts noted about the partially visualized kidneys, similar to prior, likely benign. No follow-up imaging recommended.   Disc levels:   T11-12: Seen only on sagittal projection. Mild disc bulge with reactive endplate spurring. Left worse than right facet hypertrophy. No significant spinal stenosis. Foramina appear patent.   T12-L1: Chronic endplate Schmorl's node deformities without significant disc bulge. Mild right-sided facet hypertrophy. No significant stenosis.   L1-2: Interval PLIF. No residual or recurrent spinal stenosis. Foramina appear patent.   L2-3: Disc bulge with disc desiccation and intervertebral disc space narrowing. Disc bulging eccentric to the left. Reactive endplate spurring. Moderate left with mild right facet arthrosis. Resultant mild narrowing of the left lateral recess. Mild left L2 foraminal narrowing. Right neural foramen remains patent. Appearance is stable.   L3-4: Degenerative intervertebral disc space narrowing with diffuse disc bulge and disc desiccation. Disc bulging eccentric to the left with left-sided reactive endplate spurring. Resultant extraforaminal disc osteophyte contacts the exiting left L3 nerve root as it courses of the left neural foramen (series 101, image 20). Moderate left with mild right facet arthrosis. Resultant mild narrowing of the left lateral recess. Mild left L3 foraminal stenosis. Right neural foramen remains patent. Appearance is stable.   L4-5: Disc bulge with disc desiccation. Reactive endplate spurring. Mild to moderate facet hypertrophy. No more than mild narrowing of the lateral recesses bilaterally. Mild to moderate right with mild left L4 foraminal stenosis. Appearance is stable.   L5-S1: Anterolisthesis. Disc desiccation with broad  posterior disc bulge. Severe  right-sided facet arthrosis with reactive marrow edema. Moderate left-sided foraminal narrowing. No significant spinal stenosis. Mild right L5 foraminal stenosis. Appearance is similar.   IMPRESSION: 1. Interval PLIF at L1-2 without residual or recurrent stenosis. 2. Multilevel degenerative spondylosis elsewhere throughout the lumbar spine with resultant mild left lateral recess and foraminal stenosis at L2-3 and L3-4, and mild to moderate bilateral foraminal narrowing at L4-5 and L5-S1 as above. 3. Advanced right-sided facet arthrosis at L5-S1 with associated reactive marrow edema, which could serve as a source for lower back pain.  T-spine   R hip MRI 05/05/23 IMPRESSION: 1. Mild osteoarthritis of the hips. 2. Suspected focal tear of the anterior superior labrum with possible small defect in the adjacent articular cartilage. 3. Minimal partial tearing of the right proximal hamstring tendon with more striking partial tearing of the left proximal hamstring tendon. 4. Spurring of the pubic bones with low-level edema signal in both pubic bones, degenerative arthropathy versus less likely osteitis pubis. 5. Fluid signal intensity inferiorly along the pubic symphysis and I cannot completely exclude the possibility of a right adductor aponeurotic tear.      Assessment & Plan:   Chronic lumbar and thoracic back pain with history of prior lumbar surgery, SI joint pain -Has multi level degenerative spondylosis -Hx of  L1-L2 TLIF -UDS and pain agreement completed prior visit -Continue hydrocodone  at 5 mg dose Q4h PRN, no more than 3 doses a day #90 with each refill-medication refilled today -Pt seen by Dr. Carilyn for left SI joint injection- helped for few days.  Orthopedics has noted some pain in her right SI joint -Right Lower back pain improved with Right L5 dorsal ramus., Right L4,  Right L3 and L2 medial branch radio frequency neurotomy 07/13/23 -Left L5 dorsal ramus., left L4  and left L3, L2 medial branch radio frequency neurotomy completed few days ago.  Reduced left lower back pain but not as much is on the right side.  She reports procedure was painful to complete. -Pt has allergy noted to hydrocodone  in chart-she insists this is not correct and she does not have this allergy -Zynex TENS and Heat/Cold therapy device ordered prior visit -Continue gabapentin  -dose limited by sedation -Physical therapy-patient reports this did not provide benefit -Continue to monitor pill count, PDMP, UDS random -Drug screen today   R anterior hip pain -Continue Voltaren  gel, continue medications as above -Patient reported short-term improvement after hip injection by orthopedics   THC use history -Patient reports she is not currently using -Continue random UDS -Formal warning letter regarding lower level THC was sent after UDS 09/21/23- Discussed with patient. She reported secondhand exposure after driving with someone in the car.    HTN -Continue follow-up with PCP  Neck pain -Improved, continue to monitor

## 2024-01-23 ENCOUNTER — Telehealth: Payer: Self-pay

## 2024-01-23 DIAGNOSIS — G894 Chronic pain syndrome: Secondary | ICD-10-CM

## 2024-01-23 DIAGNOSIS — M961 Postlaminectomy syndrome, not elsewhere classified: Secondary | ICD-10-CM

## 2024-01-23 DIAGNOSIS — M47817 Spondylosis without myelopathy or radiculopathy, lumbosacral region: Secondary | ICD-10-CM

## 2024-01-23 MED ORDER — HYDROCODONE-ACETAMINOPHEN 5-325 MG PO TABS: 1.0000 | ORAL_TABLET | Freq: Three times a day (TID) | ORAL | 0 refills | Status: DC | PRN

## 2024-01-23 NOTE — Telephone Encounter (Signed)
 Original Rx has been cancelled.   Please send in a new script for Oxycodone  5-325 MG,with the diagnosis code included in the script.  Thank you.

## 2024-01-24 LAB — TOXASSURE SELECT,+ANTIDEPR,UR

## 2024-01-29 ENCOUNTER — Ambulatory Visit (INDEPENDENT_AMBULATORY_CARE_PROVIDER_SITE_OTHER): Admitting: Family Medicine

## 2024-01-29 ENCOUNTER — Encounter: Payer: Self-pay | Admitting: Family Medicine

## 2024-01-29 ENCOUNTER — Ambulatory Visit: Payer: Self-pay | Admitting: Family Medicine

## 2024-01-29 VITALS — BP 130/68 | HR 60 | Temp 97.4°F | Ht 62.0 in | Wt 110.6 lb

## 2024-01-29 DIAGNOSIS — M25511 Pain in right shoulder: Secondary | ICD-10-CM

## 2024-01-29 DIAGNOSIS — E039 Hypothyroidism, unspecified: Secondary | ICD-10-CM

## 2024-01-29 DIAGNOSIS — I10 Essential (primary) hypertension: Secondary | ICD-10-CM

## 2024-01-29 DIAGNOSIS — E782 Mixed hyperlipidemia: Secondary | ICD-10-CM | POA: Diagnosis not present

## 2024-01-29 DIAGNOSIS — Z8673 Personal history of transient ischemic attack (TIA), and cerebral infarction without residual deficits: Secondary | ICD-10-CM | POA: Diagnosis not present

## 2024-01-29 DIAGNOSIS — R748 Abnormal levels of other serum enzymes: Secondary | ICD-10-CM

## 2024-01-29 LAB — COMPREHENSIVE METABOLIC PANEL WITH GFR
ALT: 9 U/L (ref 0–35)
AST: 16 U/L (ref 0–37)
Albumin: 4.1 g/dL (ref 3.5–5.2)
Alkaline Phosphatase: 188 U/L — ABNORMAL HIGH (ref 39–117)
BUN: 17 mg/dL (ref 6–23)
CO2: 26 meq/L (ref 19–32)
Calcium: 9.5 mg/dL (ref 8.4–10.5)
Chloride: 102 meq/L (ref 96–112)
Creatinine, Ser: 0.88 mg/dL (ref 0.40–1.20)
GFR: 60.92 mL/min (ref >60.00)
Glucose, Bld: 86 mg/dL (ref 70–99)
Potassium: 4.3 meq/L (ref 3.5–5.1)
Sodium: 137 meq/L (ref 135–145)
Total Bilirubin: 0.3 mg/dL (ref 0.2–1.2)
Total Protein: 8 g/dL (ref 6.0–8.3)

## 2024-01-29 LAB — LIPID PANEL
Cholesterol: 140 mg/dL (ref 0–200)
HDL: 42.8 mg/dL (ref >39.00)
LDL Cholesterol: 65 mg/dL (ref 0–99)
NonHDL: 97.6
Total CHOL/HDL Ratio: 3
Triglycerides: 162 mg/dL — ABNORMAL HIGH (ref 0.0–149.0)
VLDL: 32.4 mg/dL (ref 0.0–40.0)

## 2024-01-29 LAB — TSH: TSH: 4.26 u[IU]/mL (ref 0.35–5.50)

## 2024-01-29 NOTE — Assessment & Plan Note (Signed)
Stable. Continue clopidogrel 75 mg daily.

## 2024-01-29 NOTE — Progress Notes (Signed)
 [facility] [address] [place] [address] Dept: [phone] Dept Fax: [phone]  Chronic Care Office Visit  Subjective:    Patient ID: Ana Santos, female    DOB: 05-21-1941, 83 y.o..   MRN: [id number]  Chief Complaint  Patient presents with   Hypertension    F/u HTN.  C/o having rt arm/shoulder pain x 2 weeks.    History of Present Illness:  Patient is in today for reassessment of chronic medical issues.  [name] has a history of hypertension, managed with amlodipine  5 mg daily. She has a history of prior thromboembolic stroke. She is managed on clopidogrel  75 mg daily.   [name] has hyperlipidemia, which is managed with pravastatin  40 mg daily.   [name] has a history of hypothyroidism. She is managed on 50 mcg daily and 25 mcg 1/2 tab daily (total daily dose of 62.5 mcg).      [name] has a history of a fall in Sept. 2021. An MRI scan in May showed multi-level degenerative lumbar disease with foraminal stenoses. She had multiple ESIs without improvement. She had a left L1-L2 microdiscectomy on 02/28/2021. She had a lumbar fusion on 08/23/2021 due to recurrent disc herniation s/p microdiscectomy. She has had further ESIs and is being seen by pain management (Kirsteins). She is being managed on Vicodin 5-325 mg BID, duloxetine  20 mg daily and gabapentin  100 mg daily.   [name] notes for the past 2 weeks she has had significant right shoulder pain. She is finding she cannot raise the arm without pain and cannot lay on that side at night. Her pain manaement regimen is not fully controlling this.  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/23/2021    Class: Chronic   Fusion of spine of lumbar region 08/23/2021   Lumbar post-laminectomy syndrome  06/15/2021   Lumbosacral spondylosis without myelopathy 02/28/2021   Foraminal stenosis due to intervertebral disc disease 01/21/2021   History of bladder cancer 01/21/2021   Labyrinthitis 12/08/2019   Visual loss, transient, bilateral 02/21/2019   Essential hypertension 02/21/2019   Colonic mass s/p lap ileocectomy 03/14/2018 03/14/2018   Acute lower GI bleeding 03/14/2018   Chronic anticoagulation 03/14/2018   Chronic tension-type headache, not intractable 05/31/2015   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 Dolphus   APPENDECTOMY     BACK SURGERY  02/28/2021   BACK SURGERY     08/2021   BREAST LUMPECTOMY Right 1986   benign   CYSTOSCOPY W/ RETROGRADES Bilateral 01/19/2023   Procedure: BILATERAL RETROGRADE PYELOGRAM;  Surgeon: Nieves Cough, MD;  Location: [facility];  Service: Urology;  Laterality: Bilateral;   CYSTOSCOPY WITH BIOPSY N/A 01/19/2023   Procedure: CYSTOSCOPY WITH BLADDER BIOPSY POST OP GEMCITABINE ;  Surgeon: Nieves Cough, MD;  Location: [facility];  Service: Urology;  Laterality: N/A;   CYSTOSCOPY WITH FULGERATION N/A 01/19/2023   Procedure: CYSTOSCOPY WITH FULGERATION;  Surgeon: Nieves Cough, MD;  Location: [facility];  Service: Urology;  Laterality: N/A;  75 MINS FOR CASE   DIAGNOSTIC LAPAROSCOPY     Proable laparoscopic right colectomy 03-14-18 Dr. Mikell   EYE SURGERY  Bilateral    ioc lens for cataracts   LAPAROSCOPIC RIGHT COLECTOMY Right 03/14/2018   Procedure: LAPAROSCOPIC ASSISTED  RIGHT COLECTOMY;  Surgeon: Mikell Katz, MD;  Location: [facility];  Service: General;  Laterality: Right;   LAPAROSCOPY N/A 03/14/2018   Procedure: LAPAROSCOPY;  Surgeon: Mikell Katz, MD;  Location: [facility];  Service: General;  Laterality: N/A;   LEFT HEART CATHETERIZATION WITH CORONARY ANGIOGRAM N/A 08/05/2014   Procedure: LEFT HEART CATHETERIZATION WITH CORONARY ANGIOGRAM;  Surgeon: Deatrice DELENA Cage, MD;  Location: [facility];  Service: Cardiovascular;  Laterality: N/A;   LUMBAR LAMINECTOMY N/A 02/28/2021   Procedure: LEFT L1-L2 MICRODISCECTOMY AND FASCIECTOMY;  Surgeon: Barbarann Oneil BROCKS, MD;  Location: [facility];  Service: Orthopedics;  Laterality: N/A;   TONSILLECTOMY     TOTAL ABDOMINAL HYSTERECTOMY  1970   complete   TRANSLABYRINTHINE PROCEDURE  2002   [facility] Dr. Graig tumor removal   TRANSURETHRAL RESECTION OF BLADDER TUMOR N/A 02/08/2018   Procedure: TRANSURETHRAL RESECTION OF BLADDER TUMOR (TURBT)/ POSTOPERATIVE INSTILLATION OF CHEMO THERAPY;  Surgeon: Nieves Cough, MD;  Location: [facility];  Service: Urology;  Laterality: N/A;   Family History  Problem Relation Age of Onset   Heart disease Mother    Cancer Mother        Lung   Heart attack Mother    Diabetes Mother    Cancer Father        Lung   Diabetes Sister    Cancer Sister        unkown type   Diabetes Brother    Lung cancer Brother        Lung   AAA (abdominal aortic aneurysm) Neg Hx    Colon cancer Neg Hx    Stomach cancer Neg Hx    Esophageal cancer Neg Hx    Liver cancer Neg Hx    Pancreatic cancer Neg Hx    Rectal cancer Neg Hx    Outpatient Medications Prior to Visit  Medication Sig Dispense Refill   amLODipine  (NORVASC ) 5 MG tablet TAKE 1 TABLET DAILY 90 tablet 3   Cholecalciferol  (VITAMIN D -3) 125 MCG (5000 UT) TABS Take 5,000 Units by mouth daily.      clopidogrel  (PLAVIX ) 75 MG tablet TAKE 1 TABLET DAILY 90 tablet 3   DULoxetine  (CYMBALTA ) 20 MG capsule TAKE 1 CAPSULE BY MOUTH EVERY DAY 90 capsule 3   gabapentin  (NEURONTIN ) 100 MG capsule Take 1-2 capsules (100-200 mg total) by mouth 3 (three) times daily. 180 capsule 2   HYDROcodone -acetaminophen  (NORCO/VICODIN) 5-325 MG tablet Take 1 tablet by mouth every 8 (eight) hours as needed for moderate pain (pain score 4-6) or severe pain (pain score 7-10). 90 tablet 0   hydroxypropyl methylcellulose / hypromellose (ISOPTO TEARS / GONIOVISC) 2.5 % ophthalmic solution Place 1 drop into both eyes at bedtime.     levothyroxine  (SYNTHROID ) 25 MCG tablet Take 0.5 tablets (12.5 mcg total) by mouth daily. Take together with 50 mcg tablet (total daily dose of 62.5 mcg) 90 tablet 3   levothyroxine  (SYNTHROID ) 50 MCG tablet TAKE 1 TABLET BY MOUTH EVERY DAY 90 tablet 3  pramipexole  (MIRAPEX ) 0.5 MG tablet TAKE 1 TABLET NIGHTLY ONE HOUR PRIOR TO GOING TO BED AS NEEDED FOR RESTLESS LEG SYNDROME 90 tablet 3   pravastatin  (PRAVACHOL ) 40 MG tablet TAKE 1 TABLET DAILY 90 tablet 3   diclofenac  Sodium (VOLTAREN  ARTHRITIS PAIN) 1 % GEL Apply 4 g topically 4 (four) times daily. 150 g 3   methylPREDNISolone  (MEDROL  DOSEPAK) 4 MG TBPK tablet Take as prescribed on the box 21 tablet 0   Facility-Administered Medications Prior to Visit  Medication Dose Route Frequency Provider Last Rate Last Admin   gemcitabine  (GEMZAR ) chemo syringe for bladder instillation 2,000 mg  2,000 mg Bladder Instillation Once Eskridge, Matthew, MD       lidocaine  (XYLOCAINE ) 1 % (with pres) injection 10 mL  10 mL Other Once Kirsteins, Prentice BRAVO, MD       lidocaine  HCl (PF) (XYLOCAINE ) 2 % injection 3 mL  3 mL Other Once Kirsteins, Prentice BRAVO, MD       Allergies  Allergen Reactions   Prednisone  Other (See Comments)    Hallucinations   Atorvastatin Other (See Comments)     Lipitor caused arm pain (3/09)   Hydrocodone -Acetaminophen  Other (See  Comments)    hallucinations   Morphine  Other (See Comments)    hallucinations   Nortriptyline  Nausea And Vomiting    Caused nausea and vomiting and shaking, kept her up all night   Topamax  [Topiramate ] Nausea And Vomiting    *dizziness*   Tramadol  Other (See Comments)    withdrawal   Objective:   Today's Vitals   01/29/24 0814  BP: 130/68  Pulse: 60  Temp: (!) 97.4 F (36.3 C)  TempSrc: Temporal  SpO2: 98%  Weight: 110 lb 9.6 oz (50.2 kg)  Height: 5' 2 (1.575 m)   Body mass index is 20.23 kg/m.   General: Well developed, well nourished. No acute distress. Psych: Alert and oriented. Normal mood and affect.  Health Maintenance Due  Topic Date Due   Zoster Vaccines- Shingrix (1 of 2) Never done   Medicare Annual Wellness (AWV)  08/01/2023     Assessment & Plan:   Problem List Items Addressed This Visit       Cardiovascular and Mediastinum   Essential hypertension - Primary   Blood pressure is in good control. Continue amlodipine  5 mg daily. I will check annual renal labs today.       Relevant Orders   Comprehensive metabolic panel with GFR     Endocrine   Hypothyroidism   I will reassess a TSH today. Continue levothyroxine  50 mcg daily and 1/2 of a 25 mcg tablet daily (total dose = 62.5 mg daily).      Relevant Orders   TSH     Other   History of stroke   Stable. Continue clopidogrel  75 mg daily.      Mixed hyperlipidemia   I will reassess lipids today. Continue pravastatin  40 mg daily.      Relevant Orders   Lipid panel   Comprehensive metabolic panel with GFR   Other Visit Diagnoses       Acute pain of right shoulder       Suspect shoulder impingment syndrome. I will refer her to orthopedics for an assessment and possible intra-articular steroid injection.   Relevant Orders   Ambulatory referral to Orthopedics       Return in about 3 months (around 04/30/2024) for Reassessment.   Garnette CHRISTELLA Simpler, MD

## 2024-01-29 NOTE — Assessment & Plan Note (Signed)
 I will reassess lipids today. Continue pravastatin 40 mg daily.

## 2024-01-29 NOTE — Assessment & Plan Note (Addendum)
Blood pressure is in good control. Continue amlodipine 5 mg daily. I will check annual renal labs today.

## 2024-01-29 NOTE — Assessment & Plan Note (Signed)
 I will reassess a TSH today. Continue levothyroxine  50 mcg daily and 1/2 of a 25 mcg tablet daily (total dose = 62.5 mg daily).

## 2024-01-31 DIAGNOSIS — C672 Malignant neoplasm of lateral wall of bladder: Secondary | ICD-10-CM | POA: Diagnosis not present

## 2024-02-07 ENCOUNTER — Ambulatory Visit (INDEPENDENT_AMBULATORY_CARE_PROVIDER_SITE_OTHER): Admitting: Sports Medicine

## 2024-02-07 ENCOUNTER — Other Ambulatory Visit (INDEPENDENT_AMBULATORY_CARE_PROVIDER_SITE_OTHER): Payer: Self-pay

## 2024-02-07 ENCOUNTER — Encounter: Payer: Self-pay | Admitting: Sports Medicine

## 2024-02-07 DIAGNOSIS — M62838 Other muscle spasm: Secondary | ICD-10-CM

## 2024-02-07 DIAGNOSIS — M19011 Primary osteoarthritis, right shoulder: Secondary | ICD-10-CM | POA: Diagnosis not present

## 2024-02-07 DIAGNOSIS — G8929 Other chronic pain: Secondary | ICD-10-CM

## 2024-02-07 DIAGNOSIS — M25511 Pain in right shoulder: Secondary | ICD-10-CM

## 2024-02-07 DIAGNOSIS — M7551 Bursitis of right shoulder: Secondary | ICD-10-CM

## 2024-02-07 MED ORDER — BUPIVACAINE HCL 0.25 % IJ SOLN
2.0000 mL | INTRAMUSCULAR | Status: AC | PRN
  Administered 2024-02-07: 2 mL via INTRA_ARTICULAR

## 2024-02-07 MED ORDER — BUPIVACAINE HCL 0.25 % IJ SOLN
1.0000 mL | INTRAMUSCULAR | Status: AC | PRN
  Administered 2024-02-07: 1 mL

## 2024-02-07 MED ORDER — LIDOCAINE HCL 1 % IJ SOLN
1.0000 mL | INTRAMUSCULAR | Status: AC | PRN
  Administered 2024-02-07: 1 mL

## 2024-02-07 MED ORDER — METHYLPREDNISOLONE ACETATE 40 MG/ML IJ SUSP
20.0000 mg | INTRAMUSCULAR | Status: AC | PRN
  Administered 2024-02-07: 20 mg via INTRAMUSCULAR

## 2024-02-07 MED ORDER — LIDOCAINE HCL 1 % IJ SOLN
2.0000 mL | INTRAMUSCULAR | Status: AC | PRN
  Administered 2024-02-07: 2 mL

## 2024-02-07 MED ORDER — METHYLPREDNISOLONE ACETATE 40 MG/ML IJ SUSP
40.0000 mg | INTRAMUSCULAR | Status: AC | PRN
  Administered 2024-02-07: 40 mg via INTRA_ARTICULAR

## 2024-02-07 NOTE — Progress Notes (Signed)
 Patient says that she has had right shoulder pain for about a month now. She denies any injury, and says that her pain has gotten progressively worse. She says that her pain is too deep to touch. She has pain with ROM. She does have medicine that she takes for her back as she has had to spine surgeries, and she says that this medicine is not helping her shoulder pain at all. She has tried ice/heat with very little relief. She denies any pain, numbness, or tingling down the arm, but says that overtime her pain has been spreading towards her neck. She points to the right upper trapezius when describing this pain, and says that the pain deep in the shoulder does feel worse than this pain. She gets popping in the shoulder that is painful.

## 2024-02-07 NOTE — Progress Notes (Signed)
 Ana Santos - 83 y.o. female MRN [id number]  Date of birth: [birth year]  Office Visit Note: Visit Date: 02/07/2024 PCP: Thedora Garnette CHRISTELLA, MD Referred by: Thedora Garnette CHRISTELLA, MD  Subjective: Chief Complaint  Patient presents with   Right Shoulder - Pain   HPI: Ana Santos is a pleasant 83 y.o. female who presents today for right shoulder pain > 1 month.  She has been having right shoulder pain for just over 1 month now.  No specific injury.  The pain has gotten progressively worse and is interfering with her sleep.  She has painful range of motion.  She is on Norco 5-325 mg every 8 hours for her pain and chronic back pain but this is not significantly helping her shoulder pain.  She also is managed on gabapentin  and Cymbalta .  She also has been having pain that radiates up the trapezius that feels tightness and restriction.  Occasionally she will get popping within the shoulder.  Pertinent ROS were reviewed with the patient and found to be negative unless otherwise specified above in HPI.   Assessment & Plan: Visit Diagnoses:  1. Chronic right shoulder pain   2. Primary osteoarthritis, right shoulder   3. Subacromial bursitis of right shoulder joint   4. Trapezius muscle spasm    Plan: Impression is right shoulder pain > 1 month which does have mild glenohumeral joint arthritis and advanced AC joint arthritic change, although I think more of her pain is emanating from the rotator cuff interval with a degree of subacromial bursitis.  He has more pain with reaching motions as opposed to mechanical bony block from her arthritic change.  Given this, we did proceed with subacromial joint injection for the right shoulder, patient tolerated well.  Given the lack of shoulder/rotator cuff activation, she is now having reciprocal trigger point and spasming of the right trapezius with a degree of scapular dysfunction.  We did perform a trigger point injection into the right trapezius today,  patient tolerated well.  I advised on heat and gentle range of motion exercises and the following.  She will continue her hydrocodone -acetaminophen  5-325 mg every 8 hours as needed for her chronic pain, she also may continue her gabapentin  which she receives through pain management.  I will see her back in about 3 weeks or so to reevaluate her pain reduction and how the rotator cuff itself is functioning.  May consider PT versus home exercise plan at that point depending on her degree of improvement.  Follow-up: Return in about 3 weeks (around 02/28/2024) for R-shoulder f/u .   Meds & Orders: No orders of the defined types were placed in this encounter.   Orders Placed This Encounter  Procedures   Large Joint Inj: R subacromial bursa   XR Shoulder Right     Procedures: Large Joint Inj: R subacromial bursa on 02/07/2024 11:04 AM Indications: pain Details: 22 G 1.5 in needle, posterior approach Medications: 2 mL lidocaine  1 %; 2 mL bupivacaine  0.25 %; 40 mg methylPREDNISolone  acetate 40 MG/ML Outcome: tolerated well, no immediate complications  Subacromial Joint Injection, Right Shoulder After discussion on risks/benefits/indications, informed verbal consent was obtained. A timeout was then performed. Patient was seated on table in exam room. The patient's shoulder was prepped with betadine and alcohol  swabs and utilizing posterior approach a 22G, 1.5 needle was directed anteriorly and laterally into the patient's subacromial space was injected with 2:2:1 mixture of lidocaine :bupivicaine:depomedrol with appreciation of free-flowing of the injectate into  the bursal space. Patient tolerated the procedure well without immediate complications.   Procedure, treatment alternatives, risks and benefits explained, specific risks discussed. Consent was given by the patient. Immediately prior to procedure a time out was called to verify the correct patient, procedure, equipment, support staff and site/side  marked as required. Patient was prepped and draped in the usual sterile fashion.    Trigger Point Inj  Date/Time: 02/07/2024 11:38 AM  Performed by: Burnetta Brunet, DO Authorized by: Burnetta Brunet, DO   Consent Given by:  Patient Site marked: the procedure site was marked   Timeout: prior to procedure the correct patient, procedure, and site was verified   Indications:  Muscle spasm and pain Total # of Trigger Points:  1 Location: neck   Needle Size:  25 G Approach:  Dorsal Medications #1:  1 mL lidocaine  1 %; 1 mL bupivacaine  0.25 %; 20 mg methylPREDNISolone  acetate 40 MG/ML Patient tolerance:  Patient tolerated the procedure well with no immediate complications Comments: Procedure: Trigger point injection, right trapezius After discussion on R/B/I and informed verbal consent was obtained, a timeout was conducted. The patient was placed in a seated position on the examination table and the area of maximal tenderness was identified over the right trapezius.  This area was cleansed with Betadine and multiple alcohol  swabs. Ethyl chloride was used for local anesthesia. Using a 25-gauge 1.5 inch needle the trigger point(s) was subsequently injected with a mixture of 0.5 cc of methylprednisolone  40 mg/mL and 1 cc of 1% lidocaine  without epinephrine  and 1 cc of bupivicaine 0.25%, with a total of 2.5 cc of injectate into the trigger point. A band-aid was applied following. Patient tolerated procedure well, there were no post-injection complications. Post-procedure instructions were given.         Clinical History: CLINICAL DATA:  Initial evaluation for low back pain, prior surgery, new symptoms.   EXAM: MRI LUMBAR SPINE WITHOUT AND WITH CONTRAST   TECHNIQUE: Multiplanar and multiecho pulse sequences of the lumbar spine were obtained without and with intravenous contrast.   CONTRAST:  10mL MULTIHANCE  GADOBENATE DIMEGLUMINE  529 MG/ML IV SOLN   COMPARISON:  Comparison made with prior MRI  from 04/26/2021.   FINDINGS: Segmentation: Standard. Lowest well-formed disc space labeled the L5-S1 level.   Alignment: Up to 4 mm anterolisthesis of L5 on S1, stable. Trace anterolisthesis of L3 on L4, also relatively stable. Straightening of the normal lumbar lordosis elsewhere. Underlying sigmoid scoliosis.   Vertebrae: Susceptibility artifact from interval PLIF at L1-2. Vertebral body height maintained without acute or chronic fracture. Bone marrow signal intensity within normal limits. Few small benign hemangiomata noted. No worrisome osseous lesions. Mild reactive marrow edema noted about the partially visualized T10-11 interspace. Reactive edema present about the right L5-S1 facet due to facet arthritis.   Conus medullaris and cauda equina: Conus extends to the L1-2 level. Conus and cauda equina appear normal.   Paraspinal and other soft tissues: Postoperative changes within the posterior paraspinous soft tissues without adverse features. Few scattered cysts noted about the partially visualized kidneys, similar to prior, likely benign. No follow-up imaging recommended.   Disc levels:   T11-12: Seen only on sagittal projection. Mild disc bulge with reactive endplate spurring. Left worse than right facet hypertrophy. No significant spinal stenosis. Foramina appear patent.   T12-L1: Chronic endplate Schmorl's node deformities without significant disc bulge. Mild right-sided facet hypertrophy. No significant stenosis.   L1-2: Interval PLIF. No residual or recurrent spinal stenosis. Foramina appear patent.  L2-3: Disc bulge with disc desiccation and intervertebral disc space narrowing. Disc bulging eccentric to the left. Reactive endplate spurring. Moderate left with mild right facet arthrosis. Resultant mild narrowing of the left lateral recess. Mild left L2 foraminal narrowing. Right neural foramen remains patent. Appearance is stable.   L3-4: Degenerative  intervertebral disc space narrowing with diffuse disc bulge and disc desiccation. Disc bulging eccentric to the left with left-sided reactive endplate spurring. Resultant extraforaminal disc osteophyte contacts the exiting left L3 nerve root as it courses of the left neural foramen (series 101, image 20). Moderate left with mild right facet arthrosis. Resultant mild narrowing of the left lateral recess. Mild left L3 foraminal stenosis. Right neural foramen remains patent. Appearance is stable.   L4-5: Disc bulge with disc desiccation. Reactive endplate spurring. Mild to moderate facet hypertrophy. No more than mild narrowing of the lateral recesses bilaterally. Mild to moderate right with mild left L4 foraminal stenosis. Appearance is stable.   L5-S1: Anterolisthesis. Disc desiccation with broad posterior disc bulge. Severe right-sided facet arthrosis with reactive marrow edema. Moderate left-sided foraminal narrowing. No significant spinal stenosis. Mild right L5 foraminal stenosis. Appearance is similar.   IMPRESSION: 1. Interval PLIF at L1-2 without residual or recurrent stenosis. 2. Multilevel degenerative spondylosis elsewhere throughout the lumbar spine with resultant mild left lateral recess and foraminal stenosis at L2-3 and L3-4, and mild to moderate bilateral foraminal narrowing at L4-5 and L5-S1 as above. 3. Advanced right-sided facet arthrosis at L5-S1 with associated reactive marrow edema, which could serve as a source for lower back pain.     Electronically Signed   By: Morene Hoard M.D.   On: 12/12/2021 00:35  She reports that she has been smoking cigarettes. She has a 12.5 pack-year smoking history. She has never used smokeless tobacco. No results for input(s): HGBA1C, LABURIC in the last 8760 hours.  Objective:    Physical Exam  Gen: Well-appearing, in no acute distress; non-toxic CV: Well-perfused. Warm.  Resp: Breathing unlabored on room air; no  wheezing. Psych: Fluid speech in conversation; appropriate affect; normal thought process  Ortho Exam - Right shoulder/Trap: There is a mild degree of swelling over the subacromial bursa, no significant warmth or redness.  There is pain with activation of the rotator cuff in the shoulder in all planes.  Positive empty can, pain with Hawkins impingement testing.  There is no significant bony block with the range of motion although pain in the external rotated position at 90/90 positioning.  There is hypertonicity of the right trapezius with associated trigger point.  There is scapular dysfunction on the right given reciprocal hypertonicity without winging.  Imaging: XR Shoulder Right Result Date: 02/07/2024 4 view x-ray of the right shoulder including AP, Grashey, scapular Y and axial views were ordered and reviewed by myself today.  X-rays demonstrate mild glenohumeral joint narrowing and arthritic change.  There is moderate to severe AC joint arthritis.  There is no acute fracture noted.  Neutral acromion.   Past Medical/Family/Surgical/Social History: Medications & Allergies reviewed per EMR, new medications updated. Patient Active Problem List   Diagnosis Date Noted   History of stroke 01/29/2024   Osteoarthritis of right hip 06/11/2023   Bladder cancer (HCC) 03/12/2023   Other secondary scoliosis, lumbar region 08/23/2021    Class: Chronic   Fusion of spine of lumbar region 08/23/2021   Lumbar post-laminectomy syndrome 06/15/2021   Lumbosacral spondylosis without myelopathy 02/28/2021   Foraminal stenosis due to intervertebral disc disease 01/21/2021  History of bladder cancer 01/21/2021   Labyrinthitis 12/08/2019   Visual loss, transient, bilateral 02/21/2019   Essential hypertension 02/21/2019   Colonic mass s/p lap ileocectomy 03/14/2018 03/14/2018   Acute lower GI bleeding 03/14/2018   Chronic anticoagulation 03/14/2018   Chronic tension-type headache, not intractable  05/31/2015   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) 03/11/2014   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   RLS (restless legs syndrome) 07/04/2012   History of cerebral aneurysm 03/26/2009   Venous insufficiency 08/10/2008   History of acoustic neuroma 08/18/2007   Hypothyroidism 08/18/2007   Tobacco use disorder, continuous 08/18/2007   Mixed hyperlipidemia 07/18/2007   Anxiety with depression 07/18/2007   COPD (chronic obstructive pulmonary disease) with chronic bronchitis (HCC) 07/18/2007   Fibromyalgia 07/18/2007   Past Medical History:  Diagnosis Date   AN (acoustic neuroma) (HCC)    deafness in R ear   Aneurysm (HCC)    basilar tip aneursym s/p stent assisted coiling 04/26/09   Anxiety    Arthritis    Bladder cancer (HCC)    Cataract    Cigarette smoker    COPD (chronic obstructive pulmonary disease) (HCC)    mild, no inhalers or oxygen used   Depression    History of kidney stones    Hyperlipidemia    Hypertension    Hypothyroidism    IBS (irritable bowel syndrome)    Mitral valve prolapse    mild takes beta blocker   Osteopenia    Stroke (HCC)    x 3 ministrokes last one feb 2014   Venous insufficiency    Vitamin D  deficiency    Family History  Problem Relation Age of Onset   Heart disease Mother    Cancer Mother        Lung   Heart attack Mother    Diabetes Mother    Cancer Father        Lung   Diabetes Sister    Cancer Sister        unkown type   Diabetes Brother    Lung cancer Brother        Lung   AAA (abdominal aortic aneurysm) Neg Hx    Colon cancer Neg Hx    Stomach cancer Neg Hx    Esophageal cancer Neg Hx    Liver cancer Neg Hx    Pancreatic cancer Neg Hx    Rectal cancer Neg Hx    Past Surgical History:  Procedure Laterality Date   ANEURYSM COILING  04/2009   Dr. Dolphus   APPENDECTOMY     BACK SURGERY  02/28/2021   BACK SURGERY     08/2021   BREAST LUMPECTOMY Right 1986   benign    CYSTOSCOPY W/ RETROGRADES Bilateral 01/19/2023   Procedure: BILATERAL RETROGRADE PYELOGRAM;  Surgeon: Nieves Cough, MD;  Location: [facility];  Service: Urology;  Laterality: Bilateral;   CYSTOSCOPY WITH BIOPSY N/A 01/19/2023   Procedure: CYSTOSCOPY WITH BLADDER BIOPSY POST OP GEMCITABINE ;  Surgeon: Nieves Cough, MD;  Location: [facility];  Service: Urology;  Laterality: N/A;   CYSTOSCOPY WITH FULGERATION N/A 01/19/2023   Procedure: CYSTOSCOPY WITH FULGERATION;  Surgeon: Nieves Cough, MD;  Location: [facility];  Service: Urology;  Laterality: N/A;  75 MINS FOR CASE   DIAGNOSTIC LAPAROSCOPY     Proable laparoscopic right colectomy 03-14-18 Dr. Mikell   EYE SURGERY Bilateral    ioc lens for cataracts   LAPAROSCOPIC RIGHT COLECTOMY Right 03/14/2018   Procedure:  LAPAROSCOPIC ASSISTED  RIGHT COLECTOMY;  Surgeon: Mikell Katz, MD;  Location: [facility];  Service: General;  Laterality: Right;   LAPAROSCOPY N/A 03/14/2018   Procedure: LAPAROSCOPY;  Surgeon: Mikell Katz, MD;  Location: [facility];  Service: General;  Laterality: N/A;   LEFT HEART CATHETERIZATION WITH CORONARY ANGIOGRAM N/A 08/05/2014   Procedure: LEFT HEART CATHETERIZATION WITH CORONARY ANGIOGRAM;  Surgeon: Deatrice DELENA Cage, MD;  Location: [facility];  Service: Cardiovascular;  Laterality: N/A;   LUMBAR LAMINECTOMY N/A 02/28/2021   Procedure: LEFT L1-L2 MICRODISCECTOMY AND FASCIECTOMY;  Surgeon: Barbarann Oneil BROCKS, MD;  Location: [facility];  Service: Orthopedics;  Laterality: N/A;   TONSILLECTOMY     TOTAL ABDOMINAL HYSTERECTOMY  1970   complete   TRANSLABYRINTHINE PROCEDURE  2002   [facility] Dr. Graig tumor removal   TRANSURETHRAL RESECTION OF BLADDER TUMOR N/A 02/08/2018   Procedure: TRANSURETHRAL RESECTION OF BLADDER TUMOR (TURBT)/ POSTOPERATIVE INSTILLATION OF CHEMO THERAPY;  Surgeon: Nieves Cough, MD;  [facility];  Service: Urology;  Laterality: N/A;   Social History   Occupational History   Occupation:  alterations   Occupation: Retired    Comment: Neurosurgeon  Tobacco Use   Smoking status: Every Day    Current packs/day: 0.25    Average packs/day: 0.3 packs/day for 50.0 years (12.5 ttl pk-yrs)    Types: Cigarettes   Smokeless tobacco: Never   Tobacco comments:    4-5 cigs daily  is aware she needs to quit  Vaping Use   Vaping status: Never Used  Substance and Sexual Activity   Alcohol  use: Never   Drug use: Never   Sexual activity: Not Currently

## 2024-02-13 ENCOUNTER — Other Ambulatory Visit (INDEPENDENT_AMBULATORY_CARE_PROVIDER_SITE_OTHER)

## 2024-02-13 ENCOUNTER — Ambulatory Visit: Payer: Self-pay | Admitting: Family Medicine

## 2024-02-13 DIAGNOSIS — R748 Abnormal levels of other serum enzymes: Secondary | ICD-10-CM

## 2024-02-13 LAB — HEPATIC FUNCTION PANEL
ALT: 15 U/L (ref 0–35)
AST: 18 U/L (ref 0–37)
Albumin: 4.4 g/dL (ref 3.5–5.2)
Alkaline Phosphatase: 192 U/L — ABNORMAL HIGH (ref 39–117)
Bilirubin, Direct: 0.1 mg/dL (ref 0.0–0.3)
Total Bilirubin: 0.2 mg/dL (ref 0.2–1.2)
Total Protein: 8.5 g/dL — ABNORMAL HIGH (ref 6.0–8.3)

## 2024-02-14 DIAGNOSIS — C672 Malignant neoplasm of lateral wall of bladder: Secondary | ICD-10-CM | POA: Diagnosis not present

## 2024-02-16 LAB — ALKALINE PHOSPHATASE, ISOENZYMES
Alkaline Phosphatase: 221 IU/L — ABNORMAL HIGH (ref 44–121)
BONE FRACTION: 15 % (ref 14–68)
INTESTINAL FRAC.: 85 % — ABNORMAL HIGH (ref 0–18)
LIVER FRACTION: 0 % — ABNORMAL LOW (ref 18–85)

## 2024-02-16 LAB — SPECIMEN STATUS REPORT

## 2024-02-24 ENCOUNTER — Other Ambulatory Visit: Payer: Self-pay | Admitting: Physical Medicine & Rehabilitation

## 2024-02-27 DIAGNOSIS — C672 Malignant neoplasm of lateral wall of bladder: Secondary | ICD-10-CM | POA: Diagnosis not present

## 2024-02-27 DIAGNOSIS — Z5111 Encounter for antineoplastic chemotherapy: Secondary | ICD-10-CM | POA: Diagnosis not present

## 2024-02-28 ENCOUNTER — Ambulatory Visit (INDEPENDENT_AMBULATORY_CARE_PROVIDER_SITE_OTHER): Admitting: Sports Medicine

## 2024-02-28 ENCOUNTER — Encounter: Payer: Self-pay | Admitting: Sports Medicine

## 2024-02-28 DIAGNOSIS — M5441 Lumbago with sciatica, right side: Secondary | ICD-10-CM | POA: Diagnosis not present

## 2024-02-28 DIAGNOSIS — M25511 Pain in right shoulder: Secondary | ICD-10-CM | POA: Diagnosis not present

## 2024-02-28 DIAGNOSIS — M7551 Bursitis of right shoulder: Secondary | ICD-10-CM

## 2024-02-28 DIAGNOSIS — G8929 Other chronic pain: Secondary | ICD-10-CM | POA: Diagnosis not present

## 2024-02-28 DIAGNOSIS — M5442 Lumbago with sciatica, left side: Secondary | ICD-10-CM

## 2024-02-28 NOTE — Progress Notes (Signed)
 Patient says that 3 days after the injection she had 100% relief with her shoulder. She has continued to have that full relief in the time since then, and is very happy with how the shoulder is feeling.

## 2024-02-28 NOTE — Progress Notes (Signed)
 Ana Santos - 83 y.o. female MRN [id number]  Date of birth: 1940-11-07  Office Visit Note: Visit Date: 02/28/2024 PCP: Thedora Garnette CHRISTELLA, MD Referred by: Thedora Garnette CHRISTELLA, MD  Subjective: Chief Complaint  Patient presents with   Right Shoulder - Follow-up   HPI: Ana Santos is a pleasant 83 y.o. female who presents today for follow-up of right shoulder pain.  Back at the end of August she was having rather bothersome pain that was interfering with ADLs and her ability to sleep secondary to the pain.  We did perform subacromial joint injection as well as a trigger point injection into the right spasming trapezius. Ana Santos states that 3 days after the injection she had 100% relief of her pain.  She has returned to working in the yard which she loves doing without any difficulty.  Reports excellent range of motion and good strength.  She is still using her hydrocodone /acetaminophen  every 8 hours as needed but this is for her chronic back pain, does follow with Dr. Moore/Dr. Eldonna.  Pertinent ROS were reviewed with the patient and found to be negative unless otherwise specified above in HPI.   Assessment & Plan: Visit Diagnoses:  1. Chronic right shoulder pain   2. Subacromial bursitis of right shoulder joint   3. Chronic bilateral low back pain with bilateral sciatica    Plan: Impression is chronic right shoulder pain with subacromial bursitis that has 100% resolved after subacromial joint injection a few weeks ago.  We did perform range of motion and strength testing which is full and intact today, so I do not think she needs formalized physical therapy.  We discussed activity modifications and daily exercises for the shoulders to stay healthy and preserved.  She is still using her hydrocodone -acetaminophen  5-325 mg every 8 hours as needed for her chronic pain, but this is more so secondary to her back pain.  She has trialed several injections with Dr. Eldonna, she will continue  follow-up with him in our spine physician Dr. Georgina.  Happy to see her back as needed.  Follow-up: Return if symptoms worsen or fail to improve.   Meds & Orders: No orders of the defined types were placed in this encounter.  No orders of the defined types were placed in this encounter.    Procedures: No procedures performed      Clinical History: CLINICAL DATA:  Initial evaluation for low back pain, prior surgery, new symptoms.   EXAM: MRI LUMBAR SPINE WITHOUT AND WITH CONTRAST   TECHNIQUE: Multiplanar and multiecho pulse sequences of the lumbar spine were obtained without and with intravenous contrast.   CONTRAST:  10mL MULTIHANCE  GADOBENATE DIMEGLUMINE  529 MG/ML IV SOLN   COMPARISON:  Comparison made with prior MRI from 04/26/2021.   FINDINGS: Segmentation: Standard. Lowest well-formed disc space labeled the L5-S1 level.   Alignment: Up to 4 mm anterolisthesis of L5 on S1, stable. Trace anterolisthesis of L3 on L4, also relatively stable. Straightening of the normal lumbar lordosis elsewhere. Underlying sigmoid scoliosis.   Vertebrae: Susceptibility artifact from interval PLIF at L1-2. Vertebral body height maintained without acute or chronic fracture. Bone marrow signal intensity within normal limits. Few small benign hemangiomata noted. No worrisome osseous lesions. Mild reactive marrow edema noted about the partially visualized T10-11 interspace. Reactive edema present about the right L5-S1 facet due to facet arthritis.   Conus medullaris and cauda equina: Conus extends to the L1-2 level. Conus and cauda equina appear normal.   Paraspinal and  other soft tissues: Postoperative changes within the posterior paraspinous soft tissues without adverse features. Few scattered cysts noted about the partially visualized kidneys, similar to prior, likely benign. No follow-up imaging recommended.   Disc levels:   T11-12: Seen only on sagittal projection. Mild disc bulge  with reactive endplate spurring. Left worse than right facet hypertrophy. No significant spinal stenosis. Foramina appear patent.   T12-L1: Chronic endplate Schmorl's node deformities without significant disc bulge. Mild right-sided facet hypertrophy. No significant stenosis.   L1-2: Interval PLIF. No residual or recurrent spinal stenosis. Foramina appear patent.   L2-3: Disc bulge with disc desiccation and intervertebral disc space narrowing. Disc bulging eccentric to the left. Reactive endplate spurring. Moderate left with mild right facet arthrosis. Resultant mild narrowing of the left lateral recess. Mild left L2 foraminal narrowing. Right neural foramen remains patent. Appearance is stable.   L3-4: Degenerative intervertebral disc space narrowing with diffuse disc bulge and disc desiccation. Disc bulging eccentric to the left with left-sided reactive endplate spurring. Resultant extraforaminal disc osteophyte contacts the exiting left L3 nerve root as it courses of the left neural foramen (series 101, image 20). Moderate left with mild right facet arthrosis. Resultant mild narrowing of the left lateral recess. Mild left L3 foraminal stenosis. Right neural foramen remains patent. Appearance is stable.   L4-5: Disc bulge with disc desiccation. Reactive endplate spurring. Mild to moderate facet hypertrophy. No more than mild narrowing of the lateral recesses bilaterally. Mild to moderate right with mild left L4 foraminal stenosis. Appearance is stable.   L5-S1: Anterolisthesis. Disc desiccation with broad posterior disc bulge. Severe right-sided facet arthrosis with reactive marrow edema. Moderate left-sided foraminal narrowing. No significant spinal stenosis. Mild right L5 foraminal stenosis. Appearance is similar.   IMPRESSION: 1. Interval PLIF at L1-2 without residual or recurrent stenosis. 2. Multilevel degenerative spondylosis elsewhere throughout the lumbar spine with  resultant mild left lateral recess and foraminal stenosis at L2-3 and L3-4, and mild to moderate bilateral foraminal narrowing at L4-5 and L5-S1 as above. 3. Advanced right-sided facet arthrosis at L5-S1 with associated reactive marrow edema, which could serve as a source for lower back pain.     Electronically Signed   By: Morene Hoard M.D.   On: 12/12/2021 00:35  She reports that she has been smoking cigarettes. She has a 12.5 pack-year smoking history. She has never used smokeless tobacco. No results for input(s): HGBA1C, LABURIC in the last 8760 hours.  Objective:   Vital Signs: There were no vitals taken for this visit.  Physical Exam  Gen: Well-appearing, in no acute distress; non-toxic CV: Well-perfused. Warm.  Resp: Breathing unlabored on room air; no wheezing. Psych: Fluid speech in conversation; appropriate affect; normal thought process  Ortho Exam - Right shoulder: No bony tenderness, no AC joint tenderness.  There is resolution of her previous mild bursitis, no swelling on exam today.  Full active and passive range of motion in all directions.  5/5 strength testing which is equivocal of the rotator cuff for both shoulders.  Imaging: No results found.  Past Medical/Family/Surgical/Social History: Medications & Allergies reviewed per EMR, new medications updated. Patient Active Problem List   Diagnosis Date Noted   History of stroke 01/29/2024   Osteoarthritis of right hip 06/11/2023   Bladder cancer (HCC) 03/12/2023   Other secondary scoliosis, lumbar region 08/23/2021    Class: Chronic   Fusion of spine of lumbar region 08/23/2021   Lumbar post-laminectomy syndrome 06/15/2021   Lumbosacral spondylosis without myelopathy  02/28/2021   Foraminal stenosis due to intervertebral disc disease 01/21/2021   History of bladder cancer 01/21/2021   Labyrinthitis 12/08/2019   Visual loss, transient, bilateral 02/21/2019   Essential hypertension 02/21/2019    Colonic mass s/p lap ileocectomy 03/14/2018 03/14/2018   Acute lower GI bleeding 03/14/2018   Chronic anticoagulation 03/14/2018   Chronic tension-type headache, not intractable 05/31/2015   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) 03/11/2014   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   RLS (restless legs syndrome) 07/04/2012   History of cerebral aneurysm 03/26/2009   Venous insufficiency 08/10/2008   History of acoustic neuroma 08/18/2007   Hypothyroidism 08/18/2007   Tobacco use disorder, continuous 08/18/2007   Mixed hyperlipidemia 07/18/2007   Anxiety with depression 07/18/2007   COPD (chronic obstructive pulmonary disease) with chronic bronchitis (HCC) 07/18/2007   Fibromyalgia 07/18/2007   Past Medical History:  Diagnosis Date   AN (acoustic neuroma) (HCC)    deafness in R ear   Aneurysm (HCC)    basilar tip aneursym s/p stent assisted coiling 04/26/09   Anxiety    Arthritis    Bladder cancer (HCC)    Cataract    Cigarette smoker    COPD (chronic obstructive pulmonary disease) (HCC)    mild, no inhalers or oxygen used   Depression    History of kidney stones    Hyperlipidemia    Hypertension    Hypothyroidism    IBS (irritable bowel syndrome)    Mitral valve prolapse    mild takes beta blocker   Osteopenia    Stroke (HCC)    x 3 ministrokes last one feb 2014   Venous insufficiency    Vitamin D  deficiency    Family History  Problem Relation Age of Onset   Heart disease Mother    Cancer Mother        Lung   Heart attack Mother    Diabetes Mother    Cancer Father        Lung   Diabetes Sister    Cancer Sister        unkown type   Diabetes Brother    Lung cancer Brother        Lung   AAA (abdominal aortic aneurysm) Neg Hx    Colon cancer Neg Hx    Stomach cancer Neg Hx    Esophageal cancer Neg Hx    Liver cancer Neg Hx    Pancreatic cancer Neg Hx    Rectal cancer Neg Hx    Past Surgical History:  Procedure  Laterality Date   ANEURYSM COILING  04/2009   Dr. Dolphus   APPENDECTOMY     BACK SURGERY  02/28/2021   BACK SURGERY     08/2021   BREAST LUMPECTOMY Right 1986   benign   CYSTOSCOPY W/ RETROGRADES Bilateral 01/19/2023   Procedure: BILATERAL RETROGRADE PYELOGRAM;  Surgeon: Nieves Cough, MD;  Location: [facility];  Service: Urology;  Laterality: Bilateral;   CYSTOSCOPY WITH BIOPSY N/A 01/19/2023   Procedure: CYSTOSCOPY WITH BLADDER BIOPSY POST OP GEMCITABINE ;  Surgeon: Nieves Cough, MD;  Location: [facility];  Service: Urology;  Laterality: N/A;   CYSTOSCOPY WITH FULGERATION N/A 01/19/2023   Procedure: CYSTOSCOPY WITH FULGERATION;  Surgeon: Nieves Cough, MD;  Location: [facility];  Service: Urology;  Laterality: N/A;  75 MINS FOR CASE   DIAGNOSTIC LAPAROSCOPY     Proable laparoscopic right colectomy 03-14-18 Dr. Mikell   EYE SURGERY Bilateral    ioc  lens for cataracts   LAPAROSCOPIC RIGHT COLECTOMY Right 03/14/2018   Procedure: LAPAROSCOPIC ASSISTED  RIGHT COLECTOMY;  Surgeon: Mikell Katz, MD;  Location: [facility];  Service: General;  Laterality: Right;   LAPAROSCOPY N/A 03/14/2018   Procedure: LAPAROSCOPY;  Surgeon: Mikell Katz, MD;  Location: [facility];  Service: General;  Laterality: N/A;   LEFT HEART CATHETERIZATION WITH CORONARY ANGIOGRAM N/A 08/05/2014   Procedure: LEFT HEART CATHETERIZATION WITH CORONARY ANGIOGRAM;  Surgeon: Deatrice DELENA Cage, MD;  Location: [facility];  Service: Cardiovascular;  Laterality: N/A;   LUMBAR LAMINECTOMY N/A 02/28/2021   Procedure: LEFT L1-L2 MICRODISCECTOMY AND FASCIECTOMY;  Surgeon: Barbarann Oneil BROCKS, MD;  Location: [facility];  Service: Orthopedics;  Laterality: N/A;   TONSILLECTOMY     TOTAL ABDOMINAL HYSTERECTOMY  1970   complete   TRANSLABYRINTHINE PROCEDURE  2002   [facility] Dr. Graig tumor removal   TRANSURETHRAL RESECTION OF BLADDER TUMOR N/A 02/08/2018   Procedure: TRANSURETHRAL RESECTION OF BLADDER TUMOR (TURBT)/ POSTOPERATIVE INSTILLATION OF  CHEMO THERAPY;  Surgeon: Nieves Cough, MD;  Location: [facility];  Service: Urology;  Laterality: N/A;   Social History   Occupational History   Occupation: alterations   Occupation: Retired    Comment: Neurosurgeon  Tobacco Use   Smoking status: Every Day    Current packs/day: 0.25    Average packs/day: 0.3 packs/day for 50.0 years (12.5 ttl pk-yrs)    Types: Cigarettes   Smokeless tobacco: Never   Tobacco comments:    4-5 cigs daily  is aware she needs to quit  Vaping Use   Vaping status: Never Used  Substance and Sexual Activity   Alcohol  use: Never   Drug use: Never   Sexual activity: Not Currently

## 2024-03-05 DIAGNOSIS — M79645 Pain in left finger(s): Secondary | ICD-10-CM | POA: Diagnosis not present

## 2024-03-05 DIAGNOSIS — C672 Malignant neoplasm of lateral wall of bladder: Secondary | ICD-10-CM | POA: Diagnosis not present

## 2024-03-05 DIAGNOSIS — S60932A Unspecified superficial injury of left thumb, initial encounter: Secondary | ICD-10-CM | POA: Diagnosis not present

## 2024-03-05 DIAGNOSIS — S62502A Fracture of unspecified phalanx of left thumb, initial encounter for closed fracture: Secondary | ICD-10-CM | POA: Diagnosis not present

## 2024-03-07 DIAGNOSIS — S62525A Nondisplaced fracture of distal phalanx of left thumb, initial encounter for closed fracture: Secondary | ICD-10-CM | POA: Diagnosis not present

## 2024-03-16 ENCOUNTER — Other Ambulatory Visit: Payer: Self-pay | Admitting: Family Medicine

## 2024-03-16 DIAGNOSIS — E039 Hypothyroidism, unspecified: Secondary | ICD-10-CM

## 2024-03-19 ENCOUNTER — Other Ambulatory Visit: Payer: Self-pay | Admitting: Family Medicine

## 2024-03-19 DIAGNOSIS — G2581 Restless legs syndrome: Secondary | ICD-10-CM

## 2024-03-20 ENCOUNTER — Telehealth: Payer: Self-pay

## 2024-03-20 DIAGNOSIS — M47817 Spondylosis without myelopathy or radiculopathy, lumbosacral region: Secondary | ICD-10-CM

## 2024-03-20 DIAGNOSIS — M961 Postlaminectomy syndrome, not elsewhere classified: Secondary | ICD-10-CM

## 2024-03-20 DIAGNOSIS — G894 Chronic pain syndrome: Secondary | ICD-10-CM

## 2024-03-20 NOTE — Telephone Encounter (Signed)
 Patient calling into the office requesting a refill on Hydrocodone .  Patient has an upcoming follow up appointment on 03/24/24.  Please advise if refill is deemed appropriate

## 2024-03-21 ENCOUNTER — Other Ambulatory Visit: Payer: Self-pay | Admitting: Family Medicine

## 2024-03-21 MED ORDER — HYDROCODONE-ACETAMINOPHEN 5-325 MG PO TABS: 1.0000 | ORAL_TABLET | Freq: Three times a day (TID) | ORAL | 0 refills | Status: DC | PRN

## 2024-03-21 NOTE — Telephone Encounter (Signed)
 Called again saying please please please call in my medication

## 2024-03-24 ENCOUNTER — Encounter: Payer: Self-pay | Admitting: Physical Medicine & Rehabilitation

## 2024-03-24 ENCOUNTER — Encounter: Attending: Physical Medicine & Rehabilitation | Admitting: Physical Medicine & Rehabilitation

## 2024-03-24 DIAGNOSIS — M961 Postlaminectomy syndrome, not elsewhere classified: Secondary | ICD-10-CM | POA: Diagnosis not present

## 2024-03-24 DIAGNOSIS — G894 Chronic pain syndrome: Secondary | ICD-10-CM | POA: Insufficient documentation

## 2024-03-24 DIAGNOSIS — M47817 Spondylosis without myelopathy or radiculopathy, lumbosacral region: Secondary | ICD-10-CM | POA: Diagnosis not present

## 2024-03-24 MED ORDER — HYDROCODONE-ACETAMINOPHEN 5-325 MG PO TABS: 1.0000 | ORAL_TABLET | Freq: Three times a day (TID) | ORAL | 0 refills | Status: DC | PRN
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
## 2024-03-28 DIAGNOSIS — S62525A Nondisplaced fracture of distal phalanx of left thumb, initial encounter for closed fracture: Secondary | ICD-10-CM | POA: Diagnosis not present

## 2024-04-04 DIAGNOSIS — H1132 Conjunctival hemorrhage, left eye: Secondary | ICD-10-CM | POA: Diagnosis not present

## 2024-04-18 ENCOUNTER — Telehealth: Payer: Self-pay | Admitting: Family Medicine

## 2024-04-18 ENCOUNTER — Ambulatory Visit
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
## 2024-04-25 DIAGNOSIS — S62525A Nondisplaced fracture of distal phalanx of left thumb, initial encounter for closed fracture: Secondary | ICD-10-CM | POA: Diagnosis not present

## 2024-04-28 ENCOUNTER — Telehealth: Payer: Self-pay

## 2024-04-28 DIAGNOSIS — M47817 Spondylosis without myelopathy or radiculopathy, lumbosacral region: Secondary | ICD-10-CM

## 2024-04-28 DIAGNOSIS — G894 Chronic pain syndrome: Secondary | ICD-10-CM

## 2024-04-28 DIAGNOSIS — M961 Postlaminectomy syndrome, not elsewhere classified: Secondary | ICD-10-CM

## 2024-04-28 MED ORDER — HYDROCODONE-ACETAMINOPHEN 5-325 MG PO TABS
1.0000 | ORAL_TABLET | Freq: Three times a day (TID) | ORAL | 0 refills | Status: AC | PRN
Start: 2024-04-28 — End: ?

## 2024-04-30 ENCOUNTER — Encounter: Payer: Self-pay | Admitting: Family Medicine

## 2024-04-30 ENCOUNTER — Ambulatory Visit (INDEPENDENT_AMBULATORY_CARE_PROVIDER_SITE_OTHER): Admitting: Family Medicine

## 2024-04-30 VITALS — BP 130/78 | HR 70 | Temp 97.9°F | Ht 62.0 in | Wt 111.0 lb

## 2024-04-30 DIAGNOSIS — E039 Hypothyroidism, unspecified: Secondary | ICD-10-CM

## 2024-04-30 DIAGNOSIS — S62525D Nondisplaced fracture of distal phalanx of left thumb, subsequent encounter for fracture with routine healing: Secondary | ICD-10-CM | POA: Diagnosis not present

## 2024-04-30 DIAGNOSIS — R748 Abnormal levels of other serum enzymes: Secondary | ICD-10-CM | POA: Insufficient documentation

## 2024-04-30 DIAGNOSIS — Z8673 Personal history of transient ischemic attack (TIA), and cerebral infarction without residual deficits: Secondary | ICD-10-CM

## 2024-04-30 DIAGNOSIS — E782 Mixed hyperlipidemia: Secondary | ICD-10-CM | POA: Diagnosis not present

## 2024-04-30 DIAGNOSIS — Z23 Encounter for immunization: Secondary | ICD-10-CM

## 2024-04-30 DIAGNOSIS — C679 Malignant neoplasm of bladder, unspecified: Secondary | ICD-10-CM | POA: Diagnosis not present

## 2024-04-30 DIAGNOSIS — I10 Essential (primary) hypertension: Secondary | ICD-10-CM | POA: Diagnosis not present

## 2024-04-30 DIAGNOSIS — J4489 Other specified chronic obstructive pulmonary disease: Secondary | ICD-10-CM

## 2024-04-30 NOTE — Assessment & Plan Note (Signed)
 Stable.  Asymptomatic at present

## 2024-04-30 NOTE — Assessment & Plan Note (Signed)
 Blood pressure is in good control. Continue amlodipine 5 mg daily.

## 2024-04-30 NOTE — Assessment & Plan Note (Signed)
 Receiving gemcitabine  bladder infusions q 6 months. Continue to follow with urology.

## 2024-04-30 NOTE — Assessment & Plan Note (Addendum)
 LDL cholesterol is at goal. Continue pravastatin  40 mg daily.

## 2024-04-30 NOTE — Assessment & Plan Note (Addendum)
 TSH is at goal. Continue levothyroxine  50 mcg daily and 1/2 of a 25 mcg tablet daily (total dose = 62.5 mg daily).

## 2024-04-30 NOTE — Assessment & Plan Note (Addendum)
 Stable. No obvious physical sequelae. Continue clopidogrel  75 mg daily.
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
## 2024-05-07 NOTE — Telephone Encounter (Signed)
 New rx written and sent to pharmacy due to diagnosis codes not seen per [facility].  Corrected and resent by Dr. Urbano

## 2024-05-22 ENCOUNTER — Encounter: Attending: Physical Medicine & Rehabilitation | Admitting: Physical Medicine & Rehabilitation

## 2024-05-22 ENCOUNTER — Encounter: Payer: Self-pay | Admitting: Physical Medicine & Rehabilitation

## 2024-05-22 VITALS — BP 131/75 | HR 67 | Ht 62.0 in | Wt 113.2 lb

## 2024-05-22 DIAGNOSIS — Z5181 Encounter for therapeutic drug level monitoring: Secondary | ICD-10-CM | POA: Insufficient documentation

## 2024-05-22 DIAGNOSIS — M47817 Spondylosis without myelopathy or radiculopathy, lumbosacral region: Secondary | ICD-10-CM | POA: Diagnosis present

## 2024-05-22 DIAGNOSIS — Z79891 Long term (current) use of opiate analgesic: Secondary | ICD-10-CM

## 2024-05-22 DIAGNOSIS — M961 Postlaminectomy syndrome, not elsewhere classified: Secondary | ICD-10-CM | POA: Diagnosis present

## 2024-05-22 DIAGNOSIS — G894 Chronic pain syndrome: Secondary | ICD-10-CM | POA: Insufficient documentation

## 2024-05-22 NOTE — Progress Notes (Signed)
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 normal.   Paraspinal and other soft tissues: Postoperative changes within the posterior paraspinous soft tissues without adverse features. Few scattered cysts noted about the partially visualized kidneys, similar to prior, likely benign. No follow-up imaging recommended.   Disc levels:   T11-12: Seen only on sagittal projection. Mild disc bulge with reactive endplate spurring. Left worse than right facet hypertrophy. No significant spinal stenosis. Foramina appear patent.   T12-L1: Chronic endplate Schmorl's node deformities without significant disc bulge. Mild right-sided facet hypertrophy. No significant stenosis.   L1-2: Interval PLIF. No residual or recurrent spinal stenosis. Foramina appear patent.   L2-3: Disc bulge with disc desiccation and intervertebral disc space narrowing. Disc bulging eccentric to the left. Reactive endplate spurring. Moderate left with mild right facet arthrosis. Resultant mild narrowing of the left  lateral recess. Mild left L2 foraminal narrowing. Right neural foramen remains patent. Appearance is stable.   L3-4: Degenerative intervertebral disc space narrowing with diffuse disc bulge and disc desiccation. Disc bulging eccentric to the left with left-sided reactive endplate spurring. Resultant extraforaminal disc osteophyte contacts the exiting left L3 nerve root as it courses of the left neural foramen (series 101, image 20). Moderate left with mild right facet arthrosis. Resultant mild narrowing of the left lateral recess. Mild left L3 foraminal stenosis. Right neural foramen remains patent. Appearance is stable.   L4-5: Disc bulge with disc desiccation. Reactive endplate spurring. Mild to moderate facet hypertrophy. No more than mild narrowing of the lateral recesses bilaterally. Mild to moderate right with mild left L4 foraminal stenosis. Appearance is stable.   L5-S1: Anterolisthesis. Disc desiccation with broad posterior disc bulge. Severe right-sided facet arthrosis with reactive marrow edema. Moderate left-sided foraminal narrowing. No significant spinal stenosis. Mild right L5 foraminal stenosis. Appearance is similar.   IMPRESSION: 1. Interval PLIF at L1-2 without residual or recurrent stenosis. 2. Multilevel degenerative spondylosis elsewhere throughout the lumbar spine with resultant mild left lateral recess and foraminal stenosis at L2-3 and L3-4, and mild to moderate bilateral foraminal narrowing at L4-5  and L5-S1 as above. 3. Advanced right-sided facet arthrosis at L5-S1 with associated reactive marrow edema, which could serve as a source for lower back pain.  T-spine   R hip MRI 05/05/23 IMPRESSION: 1. Mild osteoarthritis of the hips. 2. Suspected focal tear of the anterior superior labrum with possible small defect in the adjacent articular cartilage. 3. Minimal partial tearing of the right proximal hamstring tendon with more striking partial tearing of  the left proximal hamstring tendon. 4. Spurring of the pubic bones with low-level edema signal in both pubic bones, degenerative arthropathy versus less likely osteitis pubis. 5. Fluid signal intensity inferiorly along the pubic symphysis and I cannot completely exclude the possibility of a right adductor aponeurotic tear.      Assessment & Plan:   Chronic lumbar and thoracic back pain with history of prior lumbar surgery, SI joint pain -Has multi level degenerative spondylosis -Hx of  L1-L2 TLIF -UDS and pain agreement completed prior visit -Continue hydrocodone  at 5 mg dose Q8h PRN-ordered -Pt seen by Dr. Carilyn for left SI joint injection- helped for few days.  Orthopedics has noted some pain in her right SI joint -Right Lower back pain improved with Right L5 dorsal ramus., Right L4,  Right L3 and L2 medial branch radio frequency neurotomy 07/13/23 -Left L5 dorsal ramus., left L4 and left L3, L2 medial branch radio frequency neurotomy completed .  Reduced left lower back pain but not as much is on the right side.  She reports procedure was painful -Pt has allergy noted to hydrocodone  in chart-she insists this is not correct and she does not have this allergy -Zynex TENS and Heat/Cold therapy device ordered prior visit -Continue gabapentin  -dose limited by sedation -Physical therapy-Ana Santos reports this did not provide benefit -Continue to monitor pill count, PDMP, UDS random -Will refer to Dr. Carilyn for repeat RFA for right lumbar spine-I think this is wearing off   R anterior hip pain -Continue Voltaren  gel, continue medications as above -Ana Santos reported short-term improvement after hip injection by orthopedics previously, could consider repeat at later time, had MRI of her hip completed in 2024 as above   THC use history -Ana Santos reports she is not currently using -Continue random UDS -Formal warning letter regarding lower level THC was sent after UDS 09/21/23- Discussed  with Ana Santos. She reported secondhand exposure after driving with someone in the car.    HTN -Continue follow-up with PCP  Neck pain -Improved, continue to monitor  Right Chest Wall Pain: Continue conservative management with topical agents. Advised that if pain worsens, a chest x-ray can be considered.  - Ana Santos reports this is improved, continue to monitor  Left Thumb Fracture:Continue following recommendations from Orthopedics. - Ana Santos reports this is healing well, following with Ortho  Head Pain: - Ana Santos reports this is improved  Right shoulder pain - Improved with injection therapy by Dr. Burnetta with Ortho care

## 2024-05-26 LAB — DRUG TOX MONITOR 1 W/CONF, ORAL FLD
Amphetamines: NEGATIVE ng/mL (ref <10)
Barbiturates: NEGATIVE ng/mL (ref <10)
Benzodiazepines: NEGATIVE ng/mL (ref <0.50)
Buprenorphine: NEGATIVE ng/mL (ref <0.10)
Cocaine: NEGATIVE ng/mL (ref <5.0)
Codeine: NEGATIVE ng/mL (ref <2.5)
Cotinine: >250 ng/mL — ABNORMAL HIGH (ref <5.0)
Dihydrocodeine: 8 ng/mL — ABNORMAL HIGH (ref <2.5)
Fentanyl: NEGATIVE ng/mL (ref <0.10)
Heroin Metabolite: NEGATIVE ng/mL (ref <1.0)
Hydrocodone: 119.6 ng/mL — ABNORMAL HIGH (ref <2.5)
Hydromorphone: NEGATIVE ng/mL (ref <2.5)
MARIJUANA: NEGATIVE ng/mL (ref <2.5)
MDMA: NEGATIVE ng/mL (ref <10)
Meprobamate: NEGATIVE ng/mL (ref <2.5)
Methadone: NEGATIVE ng/mL (ref <5.0)
Morphine: NEGATIVE ng/mL (ref <2.5)
Nicotine Metabolite: POSITIVE ng/mL — AB (ref <5.0)
Norhydrocodone: 5.9 ng/mL — ABNORMAL HIGH (ref <2.5)
Noroxycodone: NEGATIVE ng/mL (ref <2.5)
Opiates: POSITIVE ng/mL — AB (ref <2.5)
Oxycodone: NEGATIVE ng/mL (ref <2.5)
Oxymorphone: NEGATIVE ng/mL (ref <2.5)
Phencyclidine: NEGATIVE ng/mL (ref <10)
Tapentadol: NEGATIVE ng/mL (ref <5.0)
Tramadol: NEGATIVE ng/mL (ref <5.0)
Zolpidem: NEGATIVE ng/mL (ref <5.0)

## 2024-05-26 LAB — DRUG TOX ALC METAB W/CON, ORAL FLD: Alcohol Metabolite: NEGATIVE ng/mL (ref <25)

## 2024-05-27 NOTE — Telephone Encounter (Signed)
 Scheduled 07/22/24

## 2024-06-26 ENCOUNTER — Other Ambulatory Visit: Payer: Self-pay | Admitting: Physical Medicine & Rehabilitation

## 2024-07-15 ENCOUNTER — Encounter: Admitting: Physical Medicine & Rehabilitation

## 2024-07-22 ENCOUNTER — Ambulatory Visit: Payer: Self-pay

## 2024-07-24 ENCOUNTER — Encounter: Admitting: Physical Medicine & Rehabilitation

## 2024-07-29 ENCOUNTER — Ambulatory Visit: Admitting: Family Medicine
